# Patient Record
Sex: Female | Born: 1950
Health system: Southern US, Community
[De-identification: ages and names within clinical notes are randomized; demographics above are authoritative.]

## PROBLEM LIST (undated history)

## (undated) DIAGNOSIS — M810 Age-related osteoporosis without current pathological fracture: Secondary | ICD-10-CM

## (undated) DIAGNOSIS — K219 Gastro-esophageal reflux disease without esophagitis: Secondary | ICD-10-CM

## (undated) DIAGNOSIS — D472 Monoclonal gammopathy: Secondary | ICD-10-CM

## (undated) DIAGNOSIS — K5790 Diverticulosis of intestine, part unspecified, without perforation or abscess without bleeding: Secondary | ICD-10-CM

## (undated) DIAGNOSIS — S32030A Wedge compression fracture of third lumbar vertebra, initial encounter for closed fracture: Secondary | ICD-10-CM

## (undated) DIAGNOSIS — T4145XA Adverse effect of unspecified anesthetic, initial encounter: Secondary | ICD-10-CM

## (undated) DIAGNOSIS — T8859XA Other complications of anesthesia, initial encounter: Secondary | ICD-10-CM

## (undated) DIAGNOSIS — G8929 Other chronic pain: Secondary | ICD-10-CM

## (undated) DIAGNOSIS — M549 Dorsalgia, unspecified: Secondary | ICD-10-CM

## (undated) DIAGNOSIS — Z860101 Personal history of adenomatous and serrated colon polyps: Secondary | ICD-10-CM

## (undated) DIAGNOSIS — Z9889 Other specified postprocedural states: Secondary | ICD-10-CM

## (undated) DIAGNOSIS — C9000 Multiple myeloma not having achieved remission: Secondary | ICD-10-CM

## (undated) DIAGNOSIS — R112 Nausea with vomiting, unspecified: Secondary | ICD-10-CM

## (undated) DIAGNOSIS — S22069A Unspecified fracture of T7-T8 vertebra, initial encounter for closed fracture: Secondary | ICD-10-CM

## (undated) DIAGNOSIS — E785 Hyperlipidemia, unspecified: Secondary | ICD-10-CM

## (undated) DIAGNOSIS — J029 Acute pharyngitis, unspecified: Secondary | ICD-10-CM

## (undated) DIAGNOSIS — Z86010 Personal history of colonic polyps: Secondary | ICD-10-CM

## (undated) DIAGNOSIS — Z87442 Personal history of urinary calculi: Secondary | ICD-10-CM

## (undated) DIAGNOSIS — J42 Unspecified chronic bronchitis: Secondary | ICD-10-CM

## (undated) DIAGNOSIS — C801 Malignant (primary) neoplasm, unspecified: Secondary | ICD-10-CM

## (undated) DIAGNOSIS — K449 Diaphragmatic hernia without obstruction or gangrene: Secondary | ICD-10-CM

## (undated) DIAGNOSIS — K649 Unspecified hemorrhoids: Secondary | ICD-10-CM

## (undated) DIAGNOSIS — I10 Essential (primary) hypertension: Secondary | ICD-10-CM

## (undated) DIAGNOSIS — M5416 Radiculopathy, lumbar region: Secondary | ICD-10-CM

## (undated) DIAGNOSIS — N189 Chronic kidney disease, unspecified: Secondary | ICD-10-CM

## (undated) HISTORY — DX: Hyperlipidemia, unspecified: E78.5

## (undated) HISTORY — DX: Acute pharyngitis, unspecified: J02.9

## (undated) HISTORY — DX: Personal history of colonic polyps: Z86.010

## (undated) HISTORY — PX: BACK SURGERY: SHX140

## (undated) HISTORY — PX: HERNIA REPAIR: SHX51

## (undated) HISTORY — DX: Unspecified hemorrhoids: K64.9

## (undated) HISTORY — DX: Age-related osteoporosis without current pathological fracture: M81.0

## (undated) HISTORY — DX: Diaphragmatic hernia without obstruction or gangrene: K44.9

## (undated) HISTORY — DX: Gastro-esophageal reflux disease without esophagitis: K21.9

## (undated) HISTORY — PX: URETERAL STENT PLACEMENT: SHX822

## (undated) HISTORY — DX: Diverticulosis of intestine, part unspecified, without perforation or abscess without bleeding: K57.90

## (undated) HISTORY — PX: CHOLECYSTECTOMY: SHX55

## (undated) HISTORY — DX: Personal history of adenomatous and serrated colon polyps: Z86.0101

## (undated) HISTORY — DX: Essential (primary) hypertension: I10

---

## 1998-08-03 ENCOUNTER — Other Ambulatory Visit: Admission: RE | Admit: 1998-08-03 | Discharge: 1998-08-03 | Payer: Self-pay | Admitting: Gynecology

## 2000-09-06 ENCOUNTER — Other Ambulatory Visit: Admission: RE | Admit: 2000-09-06 | Discharge: 2000-09-06 | Payer: Self-pay | Admitting: Gynecology

## 2001-09-16 ENCOUNTER — Other Ambulatory Visit: Admission: RE | Admit: 2001-09-16 | Discharge: 2001-09-16 | Payer: Self-pay | Admitting: Gynecology

## 2002-12-18 ENCOUNTER — Other Ambulatory Visit: Admission: RE | Admit: 2002-12-18 | Discharge: 2002-12-18 | Payer: Self-pay | Admitting: Gynecology

## 2004-04-18 ENCOUNTER — Other Ambulatory Visit: Admission: RE | Admit: 2004-04-18 | Discharge: 2004-04-18 | Payer: Self-pay | Admitting: Gynecology

## 2004-05-09 ENCOUNTER — Ambulatory Visit: Payer: Self-pay | Admitting: Internal Medicine

## 2005-06-20 ENCOUNTER — Ambulatory Visit: Payer: Self-pay | Admitting: Internal Medicine

## 2005-06-26 ENCOUNTER — Encounter (INDEPENDENT_AMBULATORY_CARE_PROVIDER_SITE_OTHER): Payer: Self-pay | Admitting: Specialist

## 2005-06-26 ENCOUNTER — Ambulatory Visit: Payer: Self-pay | Admitting: Internal Medicine

## 2008-07-23 ENCOUNTER — Encounter: Admission: RE | Admit: 2008-07-23 | Discharge: 2008-07-23 | Payer: Self-pay | Admitting: Gynecology

## 2010-03-21 ENCOUNTER — Encounter: Payer: Self-pay | Admitting: Gynecology

## 2010-10-24 ENCOUNTER — Other Ambulatory Visit: Payer: Self-pay | Admitting: Gynecology

## 2010-10-24 DIAGNOSIS — Z1231 Encounter for screening mammogram for malignant neoplasm of breast: Secondary | ICD-10-CM

## 2010-11-02 ENCOUNTER — Ambulatory Visit
Admission: RE | Admit: 2010-11-02 | Discharge: 2010-11-02 | Disposition: A | Discharge status: Home / Self Care | Payer: BC Managed Care – PPO | Source: Ambulatory Visit | Attending: Gynecology | Admitting: Gynecology

## 2010-11-02 DIAGNOSIS — Z1231 Encounter for screening mammogram for malignant neoplasm of breast: Secondary | ICD-10-CM

## 2011-08-03 ENCOUNTER — Encounter: Payer: Self-pay | Admitting: Internal Medicine

## 2011-10-05 ENCOUNTER — Encounter: Payer: Self-pay | Admitting: *Deleted

## 2011-10-12 ENCOUNTER — Encounter: Payer: Self-pay | Admitting: Internal Medicine

## 2011-10-19 ENCOUNTER — Encounter: Payer: Self-pay | Admitting: Internal Medicine

## 2011-10-19 ENCOUNTER — Ambulatory Visit (INDEPENDENT_AMBULATORY_CARE_PROVIDER_SITE_OTHER): Payer: BC Managed Care – PPO | Admitting: Internal Medicine

## 2011-10-19 VITALS — BP 130/70 | HR 68 | Ht 61.5 in | Wt 191.8 lb

## 2011-10-19 DIAGNOSIS — R1013 Epigastric pain: Secondary | ICD-10-CM

## 2011-10-19 DIAGNOSIS — R933 Abnormal findings on diagnostic imaging of other parts of digestive tract: Secondary | ICD-10-CM

## 2011-10-19 DIAGNOSIS — K219 Gastro-esophageal reflux disease without esophagitis: Secondary | ICD-10-CM

## 2011-10-19 MED ORDER — MOVIPREP 100 G PO SOLR: ORAL | Status: DC

## 2011-10-19 NOTE — Progress Notes (Signed)
Autumn Duran 1950-06-23 MRN [id number]   History of Present Illness:  This is a [age]-year-old white female who is here for 2 reasons:; one is screening colonoscopy. Her last colon exam in April 2007 showed 3 polyps. One was adenomatous and 2 were hyperplastic. She also had hemorrhoids and mild diverticulosis. Another issue has been a large hiatal hernia. On a recent CT scan of the abdomen in June 2013, her hernia appeared larger compared to a 2009 study. She has kidney stones in the right kidney and she is status post cholecystectomy. She has gastroesophageal reflux and fullness in the epigastrium. She regurgitates food and acid at night. She has been on omeprazole 20 mg in the mornings. Patient is complaining of gurgling and bloating as well as gas. Her hiatal hernia bothers her mostly at night, especially when she has late night meals. It helps to walk after supper in the evening.to help the epigastric fullness.   Past Medical History  Diagnosis Date  . Diverticulosis   . Hemorrhoids   . Hx of adenomatous colonic polyps   . Hiatal hernia   . Hypertension   . Hyperlipidemia   . GERD (gastroesophageal reflux disease)    Past Surgical History  Procedure Date  . Cholecystectomy     reports that she has never smoked. She has never used smokeless tobacco. She reports that she does not drink alcohol or use illicit drugs. family history includes Breast cancer in her sister. Allergies  Allergen Reactions  . Morphine And Related     Headache  . Tetracyclines & Related         Review of Systems: Denies dysphagia. Positive for regurgitation of food  The remainder of the 10 point ROS is negative except as outlined in H&P   Physical Exam: General appearance  Well developed, in no distress. Overweight Eyes- non icteric. HEENT nontraumatic, normocephalic. Mouth no lesions, tongue papillated, no cheilosis. Neck supple without adenopathy, thyroid not enlarged, no carotid bruits, no  JVD. Lungs Clear to auscultation bilaterally. Cor normal S1, normal S2, regular rhythm, no murmur,  quiet precordium. Abdomen: Protuberant with normoactive bowel sounds. Rectal: Not done. Extremities no pedal edema. Skin no lesions. Neurological alert and oriented x 3. Psychological normal mood and affect.  Assessment and Plan:  Problem #1 Chronic gastroesophageal reflux with regurgitation of acid and food in the presence of large hiatal hernia which has increased in size based on the recent CT scan of the abdomen. She will switch her Prilosec to an evening dose. She will also take antacids after meals. We have discussed the possibility of doubling up on her PPI and we have also discussed the possibility of a Nissen fundoplication. We will proceed with an upper endoscopy and biopsy to rule out Barrett's esophagus.  Problem #2 Colorectal screening. Patient has a history of adenomatous polyps. She is due for a recall colonoscopy which will be scheduled for the same day as her upper endoscopy.    10/19/2011 [name]

## 2011-10-19 NOTE — Patient Instructions (Addendum)
You have been scheduled for an endoscopy and colonoscopy. Please follow the written instructions given to you at your visit today. Please pick up your prep at the pharmacy within the next 1-3 days. If you use inhalers (even only as needed), please bring them with you on the day of your procedure. Please start taking your omeprazole at bedtime every night instead of in the mornings. You may purchase Gaviscon or Phazyme over the counter to take after meals which will help with gas. CC: Dr Sherril Croon

## 2011-12-18 ENCOUNTER — Encounter (HOSPITAL_COMMUNITY)
Admission: RE | Disposition: A | Discharge status: Home / Self Care | Payer: Self-pay | Source: Ambulatory Visit | Attending: Internal Medicine

## 2011-12-18 ENCOUNTER — Encounter (HOSPITAL_COMMUNITY): Payer: Self-pay | Admitting: Gastroenterology

## 2011-12-18 ENCOUNTER — Ambulatory Visit (HOSPITAL_COMMUNITY)
Admission: RE | Admit: 2011-12-18 | Discharge: 2011-12-18 | Disposition: A | Discharge status: Home / Self Care | Payer: BC Managed Care – PPO | Source: Ambulatory Visit | Attending: Internal Medicine | Admitting: Internal Medicine

## 2011-12-18 DIAGNOSIS — R1013 Epigastric pain: Secondary | ICD-10-CM

## 2011-12-18 DIAGNOSIS — Z860100 Personal history of colon polyps, unspecified: Secondary | ICD-10-CM | POA: Insufficient documentation

## 2011-12-18 DIAGNOSIS — K648 Other hemorrhoids: Secondary | ICD-10-CM | POA: Insufficient documentation

## 2011-12-18 DIAGNOSIS — D126 Benign neoplasm of colon, unspecified: Secondary | ICD-10-CM

## 2011-12-18 DIAGNOSIS — R933 Abnormal findings on diagnostic imaging of other parts of digestive tract: Secondary | ICD-10-CM

## 2011-12-18 DIAGNOSIS — Z9089 Acquired absence of other organs: Secondary | ICD-10-CM | POA: Insufficient documentation

## 2011-12-18 DIAGNOSIS — E785 Hyperlipidemia, unspecified: Secondary | ICD-10-CM | POA: Insufficient documentation

## 2011-12-18 DIAGNOSIS — K219 Gastro-esophageal reflux disease without esophagitis: Secondary | ICD-10-CM

## 2011-12-18 DIAGNOSIS — K449 Diaphragmatic hernia without obstruction or gangrene: Secondary | ICD-10-CM | POA: Insufficient documentation

## 2011-12-18 DIAGNOSIS — I10 Essential (primary) hypertension: Secondary | ICD-10-CM | POA: Insufficient documentation

## 2011-12-18 DIAGNOSIS — R1319 Other dysphagia: Secondary | ICD-10-CM

## 2011-12-18 HISTORY — PX: COLONOSCOPY: SHX5424

## 2011-12-18 HISTORY — PX: ESOPHAGOGASTRODUODENOSCOPY: SHX5428

## 2011-12-18 HISTORY — DX: Chronic kidney disease, unspecified: N18.9

## 2011-12-18 SURGERY — EGD (ESOPHAGOGASTRODUODENOSCOPY)
Anesthesia: Moderate Sedation

## 2011-12-18 MED ORDER — SODIUM CHLORIDE 0.9 % IV SOLN
INTRAVENOUS | Status: DC
  Administered 2011-12-18: 500 mL via INTRAVENOUS

## 2011-12-18 MED ORDER — MIDAZOLAM HCL 10 MG/2ML IJ SOLN
INTRAMUSCULAR | Status: AC
  Filled 2011-12-18: qty 4

## 2011-12-18 MED ORDER — FENTANYL CITRATE 0.05 MG/ML IJ SOLN
INTRAMUSCULAR | Status: AC
  Filled 2011-12-18: qty 4

## 2011-12-18 MED ORDER — MIDAZOLAM HCL 10 MG/2ML IJ SOLN
INTRAMUSCULAR | Status: DC | PRN
  Administered 2011-12-18 (×3): 2.5 mg via INTRAVENOUS

## 2011-12-18 MED ORDER — SUCRALFATE 1 GM/10ML PO SUSP: 1.0000 g | Freq: Two times a day (BID) | ORAL | Status: DC

## 2011-12-18 MED ORDER — ACETAMINOPHEN 325 MG PO TABS
650.0000 mg | ORAL_TABLET | Freq: Once | ORAL | Status: AC
  Administered 2011-12-18: 650 mg via ORAL
  Filled 2011-12-18: qty 2

## 2011-12-18 MED ORDER — OMEPRAZOLE 40 MG PO CPDR: 40.0000 mg | DELAYED_RELEASE_CAPSULE | Freq: Two times a day (BID) | ORAL | Status: DC

## 2011-12-18 MED ORDER — FENTANYL CITRATE 0.05 MG/ML IJ SOLN
INTRAMUSCULAR | Status: DC | PRN
  Administered 2011-12-18 (×3): 25 ug via INTRAVENOUS

## 2011-12-18 MED ORDER — BUTAMBEN-TETRACAINE-BENZOCAINE 2-2-14 % EX AERO
INHALATION_SPRAY | CUTANEOUS | Status: DC | PRN
  Administered 2011-12-18: 2 via TOPICAL

## 2011-12-18 NOTE — Op Note (Signed)
[facility] [address] [place], [address]   ENDOSCOPY PROCEDURE REPORT  PATIENT: Autumn, Duran  MR#: [id number] BIRTHDATE: 1950-12-21 , 61  yrs. old GENDER: Female ENDOSCOPIST: Hart Carwin, MD REFERRED BY:  Doreen Beam, M.D. PROCEDURE DATE:  12/18/2011 PROCEDURE:  EGD w/ biopsy and Maloney dilation of esophagus ASA CLASS:     Class II INDICATIONS:  history of esophageal reflux.   epigastric pain. dysphagia. MEDICATIONS: These medications were titrated to patient response per physician's verbal order, Versed-Detailed 5 mg IV, and Fentanyl-Detailed 5 mcg IV TOPICAL ANESTHETIC: Cetacaine Spray  DESCRIPTION OF PROCEDURE: After the risks benefits and alternatives of the procedure were thoroughly explained, informed consent was obtained.  The Pentax Gastroscope [id number] endoscope was introduced through the mouth and advanced to the second portion of the duodenum. Without limitations.  The instrument was slowly withdrawn as the mucosa was fully examined.      ESOPHAGUS: Irregular z-line, mild nonobstructing stricture, no hiatal hernia, Maloney dilator 48 F passed without difficulty, biopsies taken from the g-e junction to r/o Barrett's.  Retroflexed views revealed no abnormalities.     The scope was then withdrawn from the patient and the procedure completed.  COMPLICATIONS: There were no complications. ENDOSCOPIC IMPRESSION: Irregular z-line, mild nonobstructing stricture,  no hiatal hernia, Maloney dilator 48 F passed without difficulty, biopsies taken from the g-e junction to r/o Barrett's  RECOMMENDATIONS: 1.  await pathology results 2.  anti-reflux regimen to be follow 3.  continue PPI  REPEAT EXAM: no recall unless Barrett's esophagus  eSigned:  Hart Carwin, MD 12/18/2011 9:26 AM   CC:  PATIENT NAME:  Autumn, Duran MR#: [id number]

## 2011-12-18 NOTE — Op Note (Signed)
[facility] [address] [place], [address]   COLONOSCOPY PROCEDURE REPORT  PATIENT: Autumn Duran, Autumn Duran  MR#: [id number] BIRTHDATE: [birth year] , 61  yrs. old GENDER: Female ENDOSCOPIST: Hart Carwin, MD REFERRED BY:  Doreen Beam, M.D. PROCEDURE DATE:  12/18/2011 PROCEDURE:   Colonoscopy with cold biopsy polypectomy and Colonoscopy with snare polypectomy ASA CLASS:   Class II INDICATIONS:patient's personal history of adenomatous colon polyps and tubular adenoma 2007. MEDICATIONS: These medications were titrated to patient response per physician's verbal order, Versed-Detailed 2.5 mg IV, and Fentanyl-Detailed 25 mcg IV  DESCRIPTION OF PROCEDURE:   After the risks and benefits and of the procedure were explained, informed consent was obtained.  A digital rectal exam revealed no abnormalities of the rectum.    The endoscope was introduced through the anus and advanced to the cecum, which was identified by the ileocecal valve .  The quality of the prep was excellent, using MoviPrep .  The instrument was then slowly withdrawn as the colon was fully examined.     COLON FINDINGS: Eight smooth sessile polyps ranging between 3-16mm in size were found in the ascending colon and sigmoid colon.  A polypectomy was performed with cold forceps and with a cold snare. The resection was complete and the polyp tissue was completely retrieved.     Retroflexed views revealed no abnormalities.     The scope was then withdrawn from the patient and the procedure completed.  COMPLICATIONS: There were no complications. ENDOSCOPIC IMPRESSION: Eight sessile polyps ranging between 3-10mm in size were found in the ascending colon at 90cnm, and sigmoid colon at 15-20 cm polypectomy was performed with cold forceps and with a cold snare First grade internal hemorrhoids  RECOMMENDATIONS: 1.  await pathology results 2.  High fiber diet   REPEAT EXAM: In 5 year(s)  for  Colonoscopy.  cc:  _______________________________ eSignedHart Carwin, MD 12/18/2011 9:32 AM

## 2011-12-18 NOTE — H&P (Signed)
Autumn Duran 1950/03/27 MRN [id number]        History of Present Illness: 61 yo with chronic GERD refractory to PPI's, 1 episode of dysphagia, here for EGD, Also hx of adenomatous colon polyp 05/2005 - due for recall colonoscopy,     Past Medical History  Diagnosis Date  . Diverticulosis   . Hemorrhoids   . Hx of adenomatous colonic polyps   . Hiatal hernia   . Hypertension   . Hyperlipidemia   . GERD (gastroesophageal reflux disease)   . Chronic kidney disease     kidney stone s/p stent placement ( removed)   Past Surgical History  Procedure Date  . Cholecystectomy     reports that she has never smoked. She has never used smokeless tobacco. She reports that she does not drink alcohol or use illicit drugs. family history includes Breast cancer in her sister. Allergies  Allergen Reactions  . Morphine And Related     Headache  . Tetracyclines & Related         Review of Systems:  The remainder of the 10 point ROS is negative except as outlined in H&P   Physical Exam: General appearance  Well developed, in no distress. Eyes- non icteric. HEENT nontraumatic, normocephalic. Mouth no lesions, tongue papillated, no cheilosis. Neck supple without adenopathy, thyroid not enlarged, no carotid bruits, no JVD. Lungs Clear to auscultation bilaterally. Cor normal S1, normal S2, regular rhythm, no murmur,  quiet precordium. Abdomen: soft, ,active bowl sounds,  Rectal:deferred Extremities no pedal edema. Skin no lesions. Neurological alert and oriented x 3. Psychological normal mood and affect.  Assessment and Plan:  #1 problem GERD, dysphagia, hx of hiatal hernia, plan to proceed with EGD, possible dilation, r/o Barrett's esophagus  #2 Hx of adenomatous colon polyp 2007, due for recall colonoscopy   12/18/2011 Autumn Duran

## 2011-12-19 ENCOUNTER — Encounter (HOSPITAL_COMMUNITY): Payer: Self-pay | Admitting: Internal Medicine

## 2011-12-22 ENCOUNTER — Encounter: Payer: Self-pay | Admitting: Internal Medicine

## 2012-06-06 ENCOUNTER — Other Ambulatory Visit: Payer: Self-pay | Admitting: Gynecology

## 2013-01-01 ENCOUNTER — Other Ambulatory Visit: Payer: Self-pay

## 2013-01-01 DIAGNOSIS — Z1231 Encounter for screening mammogram for malignant neoplasm of breast: Secondary | ICD-10-CM

## 2013-02-03 ENCOUNTER — Ambulatory Visit
Admission: RE | Admit: 2013-02-03 | Discharge: 2013-02-03 | Disposition: A | Discharge status: Home / Self Care | Payer: BC Managed Care – PPO | Source: Ambulatory Visit

## 2013-02-03 DIAGNOSIS — Z1231 Encounter for screening mammogram for malignant neoplasm of breast: Secondary | ICD-10-CM

## 2013-07-10 ENCOUNTER — Other Ambulatory Visit: Payer: Self-pay | Admitting: Neurosurgery

## 2013-07-11 ENCOUNTER — Encounter (HOSPITAL_COMMUNITY): Payer: Self-pay | Admitting: *Deleted

## 2013-07-11 ENCOUNTER — Encounter (HOSPITAL_COMMUNITY): Payer: Self-pay | Admitting: Pharmacy Technician

## 2013-07-12 ENCOUNTER — Encounter (HOSPITAL_COMMUNITY): Payer: Self-pay | Admitting: Emergency Medicine

## 2013-07-12 ENCOUNTER — Inpatient Hospital Stay (HOSPITAL_COMMUNITY)
Admission: EM | Admit: 2013-07-12 | Discharge: 2013-07-17 | DRG: 479 | Disposition: A | Discharge status: Home Health | Payer: BC Managed Care – PPO | Attending: Internal Medicine | Admitting: Internal Medicine

## 2013-07-12 ENCOUNTER — Inpatient Hospital Stay (HOSPITAL_COMMUNITY): Payer: BC Managed Care – PPO

## 2013-07-12 DIAGNOSIS — IMO0002 Reserved for concepts with insufficient information to code with codable children: Secondary | ICD-10-CM

## 2013-07-12 DIAGNOSIS — N189 Chronic kidney disease, unspecified: Secondary | ICD-10-CM | POA: Diagnosis present

## 2013-07-12 DIAGNOSIS — T4275XA Adverse effect of unspecified antiepileptic and sedative-hypnotic drugs, initial encounter: Secondary | ICD-10-CM | POA: Diagnosis present

## 2013-07-12 DIAGNOSIS — X58XXXA Exposure to other specified factors, initial encounter: Secondary | ICD-10-CM | POA: Diagnosis present

## 2013-07-12 DIAGNOSIS — I959 Hypotension, unspecified: Secondary | ICD-10-CM | POA: Diagnosis present

## 2013-07-12 DIAGNOSIS — I129 Hypertensive chronic kidney disease with stage 1 through stage 4 chronic kidney disease, or unspecified chronic kidney disease: Secondary | ICD-10-CM | POA: Diagnosis present

## 2013-07-12 DIAGNOSIS — J42 Unspecified chronic bronchitis: Secondary | ICD-10-CM | POA: Diagnosis present

## 2013-07-12 DIAGNOSIS — R1319 Other dysphagia: Secondary | ICD-10-CM

## 2013-07-12 DIAGNOSIS — K5909 Other constipation: Secondary | ICD-10-CM | POA: Diagnosis present

## 2013-07-12 DIAGNOSIS — S32000A Wedge compression fracture of unspecified lumbar vertebra, initial encounter for closed fracture: Secondary | ICD-10-CM

## 2013-07-12 DIAGNOSIS — E785 Hyperlipidemia, unspecified: Secondary | ICD-10-CM | POA: Diagnosis present

## 2013-07-12 DIAGNOSIS — Z22322 Carrier or suspected carrier of Methicillin resistant Staphylococcus aureus: Secondary | ICD-10-CM

## 2013-07-12 DIAGNOSIS — K5900 Constipation, unspecified: Secondary | ICD-10-CM

## 2013-07-12 DIAGNOSIS — R52 Pain, unspecified: Secondary | ICD-10-CM

## 2013-07-12 DIAGNOSIS — F411 Generalized anxiety disorder: Secondary | ICD-10-CM | POA: Diagnosis present

## 2013-07-12 DIAGNOSIS — E860 Dehydration: Secondary | ICD-10-CM | POA: Diagnosis present

## 2013-07-12 DIAGNOSIS — K219 Gastro-esophageal reflux disease without esophagitis: Secondary | ICD-10-CM

## 2013-07-12 DIAGNOSIS — K449 Diaphragmatic hernia without obstruction or gangrene: Secondary | ICD-10-CM | POA: Diagnosis present

## 2013-07-12 DIAGNOSIS — I10 Essential (primary) hypertension: Secondary | ICD-10-CM

## 2013-07-12 DIAGNOSIS — S32009A Unspecified fracture of unspecified lumbar vertebra, initial encounter for closed fracture: Principal | ICD-10-CM | POA: Diagnosis present

## 2013-07-12 DIAGNOSIS — D126 Benign neoplasm of colon, unspecified: Secondary | ICD-10-CM

## 2013-07-12 LAB — CBC WITH DIFFERENTIAL/PLATELET
Basophils Absolute: 0 10*3/uL (ref 0.0–0.1)
Basophils Relative: 0 % (ref 0–1)
Eosinophils Absolute: 0.1 10*3/uL (ref 0.0–0.7)
Eosinophils Relative: 1 % (ref 0–5)
HCT: 37.2 % (ref 36.0–46.0)
Hemoglobin: 11.9 g/dL — ABNORMAL LOW (ref 12.0–15.0)
Lymphocytes Relative: 20 % (ref 12–46)
Lymphs Abs: 1.8 10*3/uL (ref 0.7–4.0)
MCH: 27.3 pg (ref 26.0–34.0)
MCHC: 32 g/dL (ref 30.0–36.0)
MCV: 85.3 fL (ref 78.0–100.0)
Monocytes Absolute: 0.7 10*3/uL (ref 0.1–1.0)
Monocytes Relative: 8 % (ref 3–12)
Neutro Abs: 6.3 10*3/uL (ref 1.7–7.7)
Neutrophils Relative %: 71 % (ref 43–77)
Platelets: 324 10*3/uL (ref 150–400)
RBC: 4.36 MIL/uL (ref 3.87–5.11)
RDW: 13.1 % (ref 11.5–15.5)
WBC: 8.8 10*3/uL (ref 4.0–10.5)

## 2013-07-12 LAB — BASIC METABOLIC PANEL
BUN: 18 mg/dL (ref 6–23)
CO2: 26 mEq/L (ref 19–32)
Calcium: 10.4 mg/dL (ref 8.4–10.5)
Chloride: 101 mEq/L (ref 96–112)
Creatinine, Ser: 0.69 mg/dL (ref 0.50–1.10)
GFR calc Af Amer: >90 mL/min (ref >90)
GFR calc non Af Amer: >90 mL/min (ref >90)
Glucose, Bld: 102 mg/dL — ABNORMAL HIGH (ref 70–99)
Potassium: 3.7 mEq/L (ref 3.7–5.3)
Sodium: 138 mEq/L (ref 137–147)

## 2013-07-12 MED ORDER — FENTANYL CITRATE 0.05 MG/ML IJ SOLN
50.0000 ug | Freq: Once | INTRAMUSCULAR | Status: AC
  Administered 2013-07-12: 50 ug via INTRAVENOUS

## 2013-07-12 MED ORDER — MAGNESIUM CITRATE PO SOLN
1.0000 | Freq: Once | ORAL | Status: AC | PRN
  Administered 2013-07-12: 1 via ORAL
  Filled 2013-07-12: qty 296

## 2013-07-12 MED ORDER — IOHEXOL 300 MG/ML  SOLN
20.0000 mL | INTRAMUSCULAR | Status: AC
  Administered 2013-07-12: 25 mL via ORAL

## 2013-07-12 MED ORDER — FENTANYL CITRATE 0.05 MG/ML IJ SOLN
50.0000 ug | Freq: Once | INTRAMUSCULAR | Status: AC
  Administered 2013-07-12: 50 ug via INTRAVENOUS
  Filled 2013-07-12: qty 2

## 2013-07-12 MED ORDER — CEFAZOLIN SODIUM-DEXTROSE 2-3 GM-% IV SOLR
2.0000 g | INTRAVENOUS | Status: AC
  Administered 2013-07-13: 2 g via INTRAVENOUS
  Filled 2013-07-12: qty 50

## 2013-07-12 MED ORDER — DIAZEPAM 5 MG PO TABS
5.0000 mg | ORAL_TABLET | Freq: Three times a day (TID) | ORAL | Status: DC | PRN
  Administered 2013-07-12 – 2013-07-14 (×3): 5 mg via ORAL
  Filled 2013-07-12 (×3): qty 1

## 2013-07-12 MED ORDER — SODIUM CHLORIDE 0.9 % IV BOLUS (SEPSIS)
500.0000 mL | Freq: Once | INTRAVENOUS | Status: AC
  Administered 2013-07-12: 500 mL via INTRAVENOUS

## 2013-07-12 MED ORDER — ONDANSETRON HCL 4 MG PO TABS: 4.0000 mg | ORAL_TABLET | Freq: Four times a day (QID) | ORAL | Status: DC | PRN

## 2013-07-12 MED ORDER — DOCUSATE SODIUM 100 MG PO CAPS
100.0000 mg | ORAL_CAPSULE | Freq: Two times a day (BID) | ORAL | Status: DC
  Administered 2013-07-13 – 2013-07-17 (×9): 100 mg via ORAL
  Filled 2013-07-12 (×9): qty 1

## 2013-07-12 MED ORDER — SORBITOL 70 % SOLN
30.0000 mL | Freq: Once | Status: AC
  Administered 2013-07-13: 30 mL via ORAL
  Filled 2013-07-12: qty 30

## 2013-07-12 MED ORDER — HYDROMORPHONE HCL PF 1 MG/ML IJ SOLN
0.5000 mg | Freq: Once | INTRAMUSCULAR | Status: AC
  Administered 2013-07-12: 0.5 mg via INTRAVENOUS
  Filled 2013-07-12: qty 1

## 2013-07-12 MED ORDER — SODIUM CHLORIDE 0.9 % IV SOLN
INTRAVENOUS | Status: AC
  Administered 2013-07-13: via INTRAVENOUS

## 2013-07-12 MED ORDER — HYDROMORPHONE HCL PF 1 MG/ML IJ SOLN
0.5000 mg | INTRAMUSCULAR | Status: DC | PRN
  Administered 2013-07-12 – 2013-07-15 (×6): 0.5 mg via INTRAVENOUS
  Filled 2013-07-12 (×7): qty 1

## 2013-07-12 MED ORDER — ONDANSETRON HCL 4 MG/2ML IJ SOLN
4.0000 mg | Freq: Four times a day (QID) | INTRAMUSCULAR | Status: DC | PRN
  Administered 2013-07-15: 4 mg via INTRAVENOUS
  Filled 2013-07-12: qty 2

## 2013-07-12 MED ORDER — IOHEXOL 300 MG/ML  SOLN: 100.0000 mL | Freq: Once | INTRAMUSCULAR | Status: AC | PRN

## 2013-07-12 MED ORDER — MUPIROCIN 2 % EX OINT
TOPICAL_OINTMENT | Freq: Two times a day (BID) | CUTANEOUS | Status: DC
  Administered 2013-07-13 (×2): 1 via NASAL
  Administered 2013-07-13 – 2013-07-15 (×5): via NASAL
  Administered 2013-07-16: 1 via NASAL
  Administered 2013-07-16 – 2013-07-17 (×2): via NASAL
  Filled 2013-07-12: qty 22

## 2013-07-12 MED ORDER — ONDANSETRON 4 MG PO TBDP
8.0000 mg | ORAL_TABLET | Freq: Once | ORAL | Status: AC
  Administered 2013-07-12: 8 mg via ORAL
  Filled 2013-07-12: qty 2

## 2013-07-12 MED ORDER — DEXAMETHASONE SODIUM PHOSPHATE 10 MG/ML IJ SOLN
10.0000 mg | INTRAMUSCULAR | Status: AC
  Administered 2013-07-13: 10 mg via INTRAVENOUS
  Filled 2013-07-12: qty 1

## 2013-07-12 MED ORDER — ENOXAPARIN SODIUM 40 MG/0.4ML ~~LOC~~ SOLN
40.0000 mg | SUBCUTANEOUS | Status: DC
  Administered 2013-07-13: 40 mg via SUBCUTANEOUS
  Filled 2013-07-12 (×2): qty 0.4

## 2013-07-12 MED ORDER — SENNA 8.6 MG PO TABS
1.0000 | ORAL_TABLET | Freq: Two times a day (BID) | ORAL | Status: DC
  Administered 2013-07-13 – 2013-07-17 (×9): 8.6 mg via ORAL
  Filled 2013-07-12 (×11): qty 1

## 2013-07-12 MED ORDER — SORBITOL 70 % SOLN
30.0000 mL | Freq: Every day | Status: DC | PRN
  Administered 2013-07-13: 30 mL via ORAL
  Filled 2013-07-12: qty 30

## 2013-07-12 MED ORDER — HYDROCODONE-ACETAMINOPHEN 5-325 MG PO TABS
1.0000 | ORAL_TABLET | ORAL | Status: DC | PRN
  Administered 2013-07-13 – 2013-07-14 (×5): 1 via ORAL
  Filled 2013-07-12 (×5): qty 1

## 2013-07-12 NOTE — ED Provider Notes (Signed)
CSN: [id number]     Arrival date & time 07/12/13  1311 History   First MD Initiated Contact with Patient 07/12/13 1547     Chief Complaint  Patient presents with  . Back Pain     (Consider location/radiation/quality/duration/timing/severity/associated sxs/prior Treatment) HPI Comments: Patient presents to the ER for evaluation of low back pain radiating to the right leg. Patient reports that symptoms have been ongoing for more than a month. She has had progressive worsening of pain. She has been seen by multiple doctors, most recently Doctor Trenton Gammon of neurosurgery. She is scheduled to have vertebroplasty in 2 days. Patient reports that in the last couple of days the pain has become severe and unbearable. She cannot walk, has had to crawl to the bathroom because of the pain. There has not been any recent fall. She denies change in bowel or bladder function.  Patient is a 63 y.o. female presenting with back pain.  Back Pain   Past Medical History  Diagnosis Date  . Diverticulosis   . Hemorrhoids   . Hx of adenomatous colonic polyps   . Hiatal hernia   . Hypertension   . Hyperlipidemia   . GERD (gastroesophageal reflux disease)   . Chronic kidney disease     kidney stone s/p stent placement ( removed)  . Complication of anesthesia   . PONV (postoperative nausea and vomiting)   . Bronchitis, chronic    Past Surgical History  Procedure Laterality Date  . Cholecystectomy    . Esophagogastroduodenoscopy  12/18/2011    Procedure: ESOPHAGOGASTRODUODENOSCOPY (EGD);  Surgeon: Lafayette Dragon, MD;  Location: [facility];  Service: Endoscopy;  Laterality: N/A;  . Colonoscopy  12/18/2011    Procedure: COLONOSCOPY;  Surgeon: Lafayette Dragon, MD;  Location: [facility];  Service: Endoscopy;  Laterality: N/A;  . Ureteral stent placement     Family History  Problem Relation Age of Onset  . Breast cancer Sister    History  Substance Use Topics  . Smoking status: Never Smoker   . Smokeless  tobacco: Never Used  . Alcohol Use: Yes     Comment: social occ   OB History   Grav Para Term Preterm Abortions TAB SAB Ect Mult Living                 Review of Systems  Musculoskeletal: Positive for back pain.  All other systems reviewed and are negative.     Allergies  Morphine and related and Tetracyclines & related  Home Medications   Prior to Admission medications   Medication Sig Start Date End Date Taking? Authorizing Provider  acetaminophen (TYLENOL) 500 MG tablet Take 1,000 mg by mouth every 6 (six) hours as needed for moderate pain.    Historical Provider, MD  olmesartan-hydrochlorothiazide (BENICAR HCT) 20-12.5 MG per tablet Take 0.5 tablets by mouth daily.    Historical Provider, MD  omeprazole (PRILOSEC) 40 MG capsule Take 40 mg by mouth daily.    Historical Provider, MD  oxyCODONE-acetaminophen (PERCOCET) 10-325 MG per tablet Take 1 tablet by mouth every 4 (four) hours as needed for pain.    Historical Provider, MD   BP 105/47  Pulse 83  Temp(Src) 98.1 F (36.7 C) (Oral)  Resp 16  Ht 5\' 1"  (1.549 m)  Wt 188 lb (85.276 kg)  BMI 35.54 kg/m2  SpO2 93% Physical Exam  Constitutional: She is oriented to person, place, and time. She appears well-developed and well-nourished. She appears distressed.  HENT:  Head: Normocephalic and  atraumatic.  Right Ear: Hearing normal.  Left Ear: Hearing normal.  Nose: Nose normal.  Mouth/Throat: Oropharynx is clear and moist and mucous membranes are normal.  Eyes: Conjunctivae and EOM are normal. Pupils are equal, round, and reactive to light.  Neck: Normal range of motion. Neck supple.  Cardiovascular: Regular rhythm, S1 normal and S2 normal.  Exam reveals no gallop and no friction rub.   No murmur heard. Pulmonary/Chest: Effort normal and breath sounds normal. No respiratory distress. She exhibits no tenderness.  Abdominal: Soft. Normal appearance and bowel sounds are normal. There is no hepatosplenomegaly. There is no  tenderness. There is no rebound, no guarding, no tenderness at McBurney's point and negative Murphy's sign. No hernia.  Musculoskeletal: Normal range of motion.       Right hip: She exhibits normal range of motion and no tenderness.       Lumbar back: She exhibits tenderness and bony tenderness.       Back:  Neurological: She is alert and oriented to person, place, and time. She has normal strength. No cranial nerve deficit or sensory deficit. Coordination normal. GCS eye subscore is 4. GCS verbal subscore is 5. GCS motor subscore is 6.  Skin: Skin is warm, dry and intact. No rash noted. No cyanosis.  Psychiatric: She has a normal mood and affect. Her speech is normal and behavior is normal. Thought content normal.    ED Course  Procedures (including critical care time) Labs Review Labs Reviewed  CBC WITH DIFFERENTIAL  BASIC METABOLIC PANEL    Imaging Review No results found.   EKG Interpretation None      MDM   Final diagnoses:  None    Patient presents for evaluation of severe worsening low back pain. She has previously been diagnosed with a compression fracture and is scheduled for vertebroplasty on Monday. Patient's pain is worsening and not improved at all with the hydrocodone she is using at home. Arrival to ER, patient has mild hypotension. De Blanch has a history of hypertension. She does not have a fever or leukocytosis, and no signs of sepsis. She was given IV fluids. She was provided analgesia with fentanyl. She has only had very minor improvement with the medication.  Case discussed with Doctor Vertell Limber, call for neurosurgery. He tells me further information. The patient's neurosurgeon, Doctor Trenton Gammon, is concerned about possible metastatic disease causing a pathological fracture in the lumbar vertebrae. He recommends admission to general medicine for further workup including CT of chest, abdomen, pelvis look for source of metastatic disease. Regardless of findings, would  recommend proceeding with vertebroplasty on Monday for pain relief.   Orpah Greek, MD 07/12/13 [phone]

## 2013-07-12 NOTE — Consult Note (Signed)
I    > [place], [place] [address] Phone: [phone]   Patient ID:   [id number] Patient: Autumn Duran  Date of Birth: Jan 09, 1951 Visit Type: Office Visit   Date: 07/10/2013 01:30 PM Provider: Mallie Mussel A. Pool MD   This 63 year old female presents for back pain.  History of Present Illness: 1.  back pain  The patient is a 63 year old female who has been referred for evaluation of severe lumbar pain.  The patient's symptoms began spontaneously approximately 4 weeks ago without any precipitating event.  She has never previously had pain like this in the past.  The pain is present in her right lumbosacral region.  The pain does not radiate.  It is not associated with any numbness, paresthesias, or weakness.  It is not associated with any bowel or bladder dysfunction.  She has no history of fever.  She has no history of malignancy.  She has no history of recent weight change.  She has no chronic cough or complaints of hemoptysis or GI blood loss.  The pain is constant and severe.  She gets mild relief with sitting.       PAST MEDICAL HISTORY, SURGICAL HISTORY, FAMILY HISTORY, SOCIAL HISTORY AND REVIEW OF SYSTEMS I have reviewed the patient's past medical, surgical, family and social history as well as the comprehensive review of systems as included on the [facility] history form dated 07/10/2013, which I have signed.    MEDICATIONS(added, continued or stopped this visit):   Started Medication Directions Instruction Stopped   Benicar  ORAL      hydrocodone 5 mg-acetaminophen 325 mg tablet take 1 tablet by oral route  every 6 hours as needed for pain    07/10/2013 Percocet 10 mg-325 mg tablet take 1-2  tablets by oral route  every 6 hours as needed     Prilosec 20 mg capsule,delayed release take 1 capsule by oral route  every day 30 minutes to 1 hour before a meal     Valium 5 mg tablet take 1 tablet by oral route 3  times every day    07/10/2013 Valium 5 mg tablet take 1-2 tablet by oral route every 6 hours as needed      ALLERGIES:  Ingredient Reaction Medication Name Comment  NO KNOWN ALLERGIES     No known allergies.   Vitals Date Temp F BP Pulse Ht In Wt Lb BMI BSA Pain Score  07/10/2013  100/63 80 61 188 35.52  9/10     PHYSICAL EXAM General Level of Distress: moderate distress Overall Appearance: normal  Head and Face  Right Left  Fundoscopic Exam:  normal normal    Cardiovascular Cardiac: normal  Right Left  Peripheral Pulses: normal normal  Carotid Pulses: normal normal  Respiratory Lungs: normal  Neurological Orientation: normal Recent and Remote Memory: normal Attention Span and Concentration:   normal Language: normal Fund of Knowledge: normal  Right Left Sensation: normal normal Upper Extremity Coordination: normal normal  Lower Extremity Coordination: normal normal  Musculoskeletal Gait and Station: antalgic gait, limps R  Right Left Upper Extremity Muscle Strength: normal normal Lower Extremity Muscle Strength: normal normal Upper Extremity Muscle Tone:  normal normal Lower Extremity Muscle Tone: normal normal  Motor Strength Upper and lower extremity motor strength was tested in the clinically pertinent muscles.     Deep Tendon Reflexes  Right Left Biceps: normal normal Triceps: normal normal Brachiloradialis: normal normal Patellar:  normal normal Achilles: normal normal  Sensory Sensation was tested at L1 to S1.   Cranial Nerves II. Optic Nerve/Visual Fields: normal III. Oculomotor: normal IV. Trochlear: normal V. Trigeminal: normal VI. Abducens: normal VII. Facial: normal VIII. Acoustic/Vestibular: normal IX. Glossopharyngeal: normal X. Vagus: normal XI. Spinal Accessory: normal XII. Hypoglossal: normal  Motor and other Tests Lhermittes: negative Rhomberg: negative Pronator  drift: absent     Right Left Spurlings negative negative Hoffman's: absent absent Clonus: absent absent SLR: negative negative Patrick's Corky Sox): negative negative Toe Walk: normal normal Toe Lift: normal normal Heel Walk: normal normal SI Joint: tender       DIAGNOSTIC RESULTS I reviewed the patient's MRI scan of her lumbar spine.  This demonstrates evidence for grade 1 L5-S1 degenerative spondylolisthesis with some foraminal narrowing bilaterally.  These changes appear to be more chronic.  At L3 on the right side the patient has evidence of a pathological vertebral body fracture with some reactive change of her right psoas muscle around the level of this fracture.  There is signal change within the superior aspect of the vertebral body on the right side consistent with hemangioma.  The remainder of the fracture site is indeterminate as to imaging.  There is definitely no epidural involvement.    IMPRESSION This patient has a very symptomatic pathologic fracture of L3 from unknown cause.  She needs to have a screening CT scan of her chest abdomen and pelvis to evaluate for possible neoplastic disease.  Given her fracture and pain level I think it is appropriate to move forward with a percutaneous vertebral body biopsy with concurrent methylmethacrylate vertebroplasty.  I discussed the risks and benefits involved with this procedure including but not limited to risk of anesthesia, bleeding, infection, nerve injury, vertebroplasty failure, continued pain, and non-benefit.  I also discussed the risks of possible non-diagnosis with the biopsy.  The patient has been given the opportunity to ask questions.  She appears to understand.  She wishes to proceed.  Assessment/Plan # Detail Type Description   1. Assessment Pathologic fracture of vertebra (733.13).         Pain Assessment/Treatment Pain Scale: 9/10. Method: Numeric Pain Intensity Scale. Location: lumbar. Onset:  06/05/2013. Duration: varies. Quality: aching, throbbing. Pain Assessment/Treatment follow-up plan of care: Patient will continue medicaiton management..  Fall Risk Plan The patient has not fallen in the last year.  Right L3 vertebral body biopsy and vertebroplasty  Orders: Diagnostic Procedures: Assessment Procedure  733.13 --------Free Text Surgery-------- - L3 perc biopsy with vertebroplasty  733.13 CT Abd & Pel; With Contrast  733.13 CT Chest With And W/o Contrast    MEDICATIONS PRESCRIBED TODAY    Rx Quantity Refills  VALIUM 5 mg  60 1  PERCOCET 10 mg-325 mg  90 0            Provider:  Cooper Render. Pool MD  07/10/2013 05:15 PM Dictation edited by: Cooper Render. Pool    CC Providers: [facility] [place], [place] [address]- ----------------------------------------------------------------------------------------------------------------------------------------------------------------------         Electronically signed by Cooper Render Pool MD on 07/10/2013 05:27 PM     I have imported the above note from our practice EMR.  Dr. Annette Stable saw the patient on 07/10/13 and scheduled her for a vertebral biopsy and cement augmentation on 07/14/13, but because of the severity of her pain, she is being admitted to the Medicine Service.  This will be useful for pain  control as well as workup of possible malignancy.  She should have Chest/Abdomen/Pelvis CT scan as an inpatient, rather than as an outpatient as was previously planned in an effort to work this pathologic fracture.

## 2013-07-12 NOTE — ED Notes (Signed)
Per pt sts worsening back pain over the past month. sts has been seen by multiple doctors and was scheduled for a biopsy on Monday. sts legs aching.

## 2013-07-12 NOTE — H&P (Signed)
[facility] History and Physical  Autumn Duran JIR:[id number] DOB: 06-Apr-1950 DOA: 07/12/2013  Referring physician: Betsey Holiday PCP: [name], MD   Chief Complaint: Low back pain  HPI: Autumn Duran is a 63 y.o. female. At baseline she has little comorbid disease. She takes Benicar for hypertension. Her reflux disease is controlled on Prilosec once daily. She does say that she has a chronic "bronchitis" which she believes may be related to sinus disease. In general she has a good functional status is able to ambulate well without cardiopulmonary limitations per her report.  The current illness started approximately one month ago. The patient complains of a right hip and right-sided low back pain which was unprovoked in onset. It has become progressively more severe. It is described as a sharp pain originating in the right lumbar region radiating forward to the right hip. It has significantly limited her ambulation. She has had some relief with hydrocodone; she describes taking approximately 6 tabs daily in recent times. The patient denies any focal weakness or numbness but does describe that in general her legs become weak from time to time, a complaint that preceded this illness.  She does not have any bowel or bladder incontinence any neurologic symptoms such as paresthesias. Patient denies fevers or chills. The patient does complain of significant constipation which she relates to her pain medications. Her chronic cough is ongoing and at her baseline level of severity; she does commonly bring up white sputum.    The patient does not have any personal history of cancer and comments that she is up-to-date on her age-appropriate cancer screening. She reports having a colonoscopy with one polyp 2 years ago. She gets regular mammograms which have been negative.  Review of Systems:  Review of Systems - History obtained from the patient General ROS: positive for  - fatigue negative for - night  sweats or weight loss Allergy and Immunology ROS: positive for - nasal congestion negative for - else Breast ROS: negative for breast lumps Respiratory ROS: no cough, shortness of breath, or wheezing positive for - cough negative for - shortness of breath or wheezing Cardiovascular ROS: no chest pain or dyspnea on exertion Gastrointestinal ROS: positive for - constipation negative for - melena or nausea/vomiting Genito-Urinary ROS: no dysuria, trouble voiding, or hematuria Musculoskeletal ROS: positive for - low back pain negative for - joint stiffness Neurological ROS: no TIA or stroke symptoms Dermatological ROS: negative The remainder of a complete review of systems was reviewed and was negative.  Past Medical History  Diagnosis Date  . Diverticulosis   . Hemorrhoids   . Hx of adenomatous colonic polyps   . Hiatal hernia   . Hypertension   . Hyperlipidemia   . GERD (gastroesophageal reflux disease)   . Chronic kidney disease     kidney stone s/p stent placement ( removed)  . Complication of anesthesia   . PONV (postoperative nausea and vomiting)   . Bronchitis, chronic    Past Surgical History  Procedure Laterality Date  . Cholecystectomy    . Esophagogastroduodenoscopy  12/18/2011    Procedure: ESOPHAGOGASTRODUODENOSCOPY (EGD);  Surgeon: Lafayette Dragon, MD;  Location: [facility];  Service: Endoscopy;  Laterality: N/A;  . Colonoscopy  12/18/2011    Procedure: COLONOSCOPY;  Surgeon: Lafayette Dragon, MD;  Location: [facility];  Service: Endoscopy;  Laterality: N/A;  . Ureteral stent placement     Social History:  reports that she has never smoked. She has never used smokeless tobacco. She reports  that she drinks alcohol. She reports that she does not use illicit drugs.  Allergies  Allergen Reactions  . Morphine And Related Other (See Comments)    Headache  . Tetracyclines & Related Nausea And Vomiting    Family History  Problem Relation Age of Onset  . Breast  cancer Sister 73  . Breast cancer Other 26    aunt     Prior to Admission medications   Medication Sig Start Date End Date Taking? Authorizing Provider  acetaminophen (TYLENOL) 500 MG tablet Take 1,000 mg by mouth every 6 (Duran) hours as needed for moderate pain.   Yes Historical Provider, MD  diazepam (VALIUM) 5 MG tablet Take 5 mg by mouth every 8 (eight) hours as needed for anxiety.   Yes Historical Provider, MD  HYDROcodone-acetaminophen (NORCO/VICODIN) 5-325 MG per tablet Take 1 tablet by mouth every 4 (four) hours as needed for moderate pain.   Yes Historical Provider, MD  ibuprofen (ADVIL,MOTRIN) 200 MG tablet Take 400 mg by mouth every 4 (four) hours as needed for mild pain.   Yes Historical Provider, MD  olmesartan (BENICAR) 20 MG tablet Take 20 mg by mouth daily.   Yes Historical Provider, MD    Physical Exam: Filed Vitals:   07/12/13 1915  BP: 111/65  Pulse: 79  Temp:   Resp:    BP 111/65  Pulse 79  Temp(Src) 98.1 F (36.7 C) (Oral)  Resp 16  Ht 5\' 1"  (1.549 m)  Wt 85.276 kg (188 lb)  BMI 35.54 kg/m2  SpO2 99%  General: Alert, oriented to self, place, and time. HEENT: Gaze is conjugate.  No nasal deformities.  No nasal discharge noted.  Mucus membranes are somewhat dry.  Tongue protrudes midline. Cardiovascular: Normal rate with regular rhythm.  Normal S1 and S2.  No rubs or gallop sounds.  No murmur.  No jugular venous distention noted.  Pulmonary: Good aeration to the bilateral bases.  No adventitious sounds auscultated. Abdominal: Soft, without distention.  Non-tender throughout.  Bowel sounds are normoactive.   GU: Rectal exam deferred. MSK: Extremities without cyanosis and with good capillary refill.  Trace edema bilaterally.  The patient does have tenderness in her right paraspinal region and some point tenderness to mid lumbar L3-4 range.  She did not tolerate a more detailed hip exam due to pain.  Skin: Without icterus, rash, or other lesions.  No significant  wounds. Neuropsychiatric: Good attention.  No obvious motor deficits.  Good insight into situation and disease process.  Capable of making medical decisions.  Worried affect; concerned about the possibility of malignancy.    Labs on Admission:  Basic Metabolic Panel:  Recent Labs Lab 07/12/13 1615  NA 138  K 3.7  CL 101  CO2 26  GLUCOSE 102*  BUN 18  CREATININE 0.69  CALCIUM 10.4   Liver Function Tests: No results found for this basename: AST, ALT, ALKPHOS, BILITOT, PROT, ALBUMIN,  in the last 168 hours No results found for this basename: LIPASE, AMYLASE,  in the last 168 hours No results found for this basename: AMMONIA,  in the last 168 hours CBC:  Recent Labs Lab 07/12/13 1615  WBC 8.8  NEUTROABS 6.3  HGB 11.9*  HCT 37.2  MCV 85.3  PLT 324   Cardiac Enzymes: No results found for this basename: CKTOTAL, CKMB, CKMBINDEX, TROPONINI,  in the last 168 hours  BNP (last 3 results) No results found for this basename: PROBNP,  in the last 8760 hours CBG: No  results found for this basename: GLUCAP,  in the last 168 hours  Radiological Exams on Admission: No results found.   Assessment/Plan Active Problems:   Lumbar compression fracture   Compression fracture  (hospital diagnoses bolded)  1. Lumbar compression fracture with refractory pain.  The patient has undergone advanced imaging of the lumbar spine: This is not able to be viewed by me in our system but the neurosurgical team as reviewed the images. The findings are at L3 there is evidence of a pathological appearing vertebral body fracture with some reactive change of her right psoas muscle around the area. The superior aspect of the vertebral body has an appearance possibly consistent with hemangioma. Overall it is unclear whether this is a pathologic fracture.  The patient is up-to-date on her cancer screening in terms of colonoscopy and mammography.  She does have a sister with breast cancer age 43 in an aunt with  breast cancer in her 37s. She denies any breast lumps.  Obtain CT scan of the chest, abdomen, pelvis to evaluate for possible disseminated malignant disease  Neurosurgery has been consulted, and they recommend vertebral plasty which will likely be done on 5/18. This will be done with a bone biopsy if possible.  Calcium is borderline 10.4; patient does report a history of hypercalcemia, which is of interest considering the workup for possible malignancy. This will be monitored closely.  For refractory pain, continue patient's basal pain medication namely acetaminophen hydrocodone every 4 hours and allow hydromorphone when necessary for severe pain; depending on the patient's progress may need a long acting pain medication prior to discharge 2. Dehydration secondary to pain causing poor ambulation and PO intake. Provide IV fluids and monitoring of electrolytes. 3. Constipation likely related to narcotic use. Provide aggressive bowel regimen including senna, docusate scheduled. Sorbitol once now and attempt to correct constipation. 4. Anxiety: Continue benzodiazepines when necessary. 5. Hypertension. Overtreated at this time considering dehydration.  Hold ACE inhibitor and restart when clinically appropriate. 6. Chronic cough; possible chronic bronchitis per patient's history. This may be related to an allergic rhinosinusitis which may benefit from intranasal steroids at discharge. 7. Gastroesophageal reflux disease. Continue PPI. 8. Hyperlipidemia does not need inpatient treatment. 9. Diet regular 10. Prophylaxis LMW H.  Neurosurgery formally consulted by ED providers with recommendations documented.  Code Status: After discussion, pt wishes to be full code.  Family Communication: Not needed; pt with good insight and capacity for decision making.  Disposition Plan: Inpatient admission.   Time spent: 53 minutes [facility] Pager [phone]

## 2013-07-12 NOTE — ED Notes (Signed)
Pt reports lower back pain x1 month - pt states she is scheduled to have a procedure done on her lower back by neurosurgery soon however is unable to tolerate the progressively worsening pain. Pt denies urinary symptoms and admits to hx of kidney stones however states this feels different. Pt denies any known mechanism of injury.

## 2013-07-13 ENCOUNTER — Other Ambulatory Visit: Payer: Self-pay

## 2013-07-13 ENCOUNTER — Encounter (HOSPITAL_COMMUNITY): Payer: Self-pay | Admitting: *Deleted

## 2013-07-13 DIAGNOSIS — K219 Gastro-esophageal reflux disease without esophagitis: Secondary | ICD-10-CM

## 2013-07-13 DIAGNOSIS — I10 Essential (primary) hypertension: Secondary | ICD-10-CM

## 2013-07-13 LAB — SURGICAL PCR SCREEN
MRSA, PCR: POSITIVE — AB
Staphylococcus aureus: POSITIVE — AB

## 2013-07-13 LAB — BASIC METABOLIC PANEL
BUN: 14 mg/dL (ref 6–23)
CO2: 25 mEq/L (ref 19–32)
Calcium: 9.9 mg/dL (ref 8.4–10.5)
Chloride: 103 mEq/L (ref 96–112)
Creatinine, Ser: 0.64 mg/dL (ref 0.50–1.10)
GFR calc Af Amer: >90 mL/min (ref >90)
GFR calc non Af Amer: >90 mL/min (ref >90)
Glucose, Bld: 89 mg/dL (ref 70–99)
Potassium: 3.9 mEq/L (ref 3.7–5.3)
Sodium: 140 mEq/L (ref 137–147)

## 2013-07-13 LAB — COMPREHENSIVE METABOLIC PANEL
ALT: 25 U/L (ref 0–35)
AST: 29 U/L (ref 0–37)
Albumin: 3.2 g/dL — ABNORMAL LOW (ref 3.5–5.2)
Alkaline Phosphatase: 112 U/L (ref 39–117)
BUN: 14 mg/dL (ref 6–23)
CO2: 24 mEq/L (ref 19–32)
Calcium: 10.3 mg/dL (ref 8.4–10.5)
Chloride: 103 mEq/L (ref 96–112)
Creatinine, Ser: 0.63 mg/dL (ref 0.50–1.10)
GFR calc Af Amer: >90 mL/min (ref >90)
GFR calc non Af Amer: >90 mL/min (ref >90)
Glucose, Bld: 159 mg/dL — ABNORMAL HIGH (ref 70–99)
Potassium: 3.8 mEq/L (ref 3.7–5.3)
Sodium: 140 mEq/L (ref 137–147)
Total Bilirubin: 0.3 mg/dL (ref 0.3–1.2)
Total Protein: 7.3 g/dL (ref 6.0–8.3)

## 2013-07-13 LAB — CBC
HCT: 35.4 % — ABNORMAL LOW (ref 36.0–46.0)
Hemoglobin: 10.9 g/dL — ABNORMAL LOW (ref 12.0–15.0)
MCH: 26.7 pg (ref 26.0–34.0)
MCHC: 30.8 g/dL (ref 30.0–36.0)
MCV: 86.6 fL (ref 78.0–100.0)
Platelets: 323 10*3/uL (ref 150–400)
RBC: 4.09 MIL/uL (ref 3.87–5.11)
RDW: 13.2 % (ref 11.5–15.5)
WBC: 6.6 10*3/uL (ref 4.0–10.5)

## 2013-07-13 MED ORDER — POLYETHYLENE GLYCOL 3350 17 G PO PACK
17.0000 g | PACK | Freq: Two times a day (BID) | ORAL | Status: DC
  Administered 2013-07-13 – 2013-07-17 (×7): 17 g via ORAL
  Filled 2013-07-13 (×10): qty 1

## 2013-07-13 MED ORDER — BUTALBITAL-APAP-CAFFEINE 50-325-40 MG PO TABS
1.0000 | ORAL_TABLET | Freq: Once | ORAL | Status: AC
Start: 2013-07-13 — End: 2013-07-13
  Administered 2013-07-13: 1 via ORAL
  Filled 2013-07-13: qty 1

## 2013-07-13 MED ORDER — PANTOPRAZOLE SODIUM 40 MG PO TBEC
40.0000 mg | DELAYED_RELEASE_TABLET | Freq: Every day | ORAL | Status: DC
  Administered 2013-07-13 – 2013-07-17 (×4): 40 mg via ORAL
  Filled 2013-07-13 (×4): qty 1

## 2013-07-13 MED ORDER — FLEET ENEMA 7-19 GM/118ML RE ENEM: 1.0000 | ENEMA | Freq: Every day | RECTAL | Status: DC | PRN

## 2013-07-13 NOTE — Evaluation (Signed)
Physical Therapy Evaluation Patient Details Name: Autumn Duran MRN: [id number] DOB: [birth year] Today's Date: 07/13/2013   History of Present Illness  63 yo female s/p L3 compression fracture of suspected pathological etiology. Scheduled for bone biopsy and vertebroplasty for 5/18.  Clinical Impression  Patient is seen for L3 compression fracture resulting in functional limitations due to the deficits listed below (see PT Problem List). Pt is at a supervision to min guard level with mobility at this time. She is scheduled for surgery tomorrow (5/18) for a vertebroplasty and will need follow-up PT for mobility training. The patient plans to d/c home at this time and will have 24 hour assist from family. Patient will benefit from skilled PT to increase their independence and safety with mobility.    Follow Up Recommendations No PT follow up ((May need to be updated post-op depending on status))    Equipment Recommendations  3in1 (PT);Other (comment) (pt reports she may be able to get wheels for her walker.)    Recommendations for Other Services OT consult     Precautions / Restrictions Precautions Precautions: Back Precaution Booklet Issued: Yes (comment) Precaution Comments: reviewed precautions Restrictions Weight Bearing Restrictions: No      Mobility  Bed Mobility Overal bed mobility: Needs Assistance Bed Mobility: Rolling;Sidelying to Sit Rolling: Min guard Sidelying to sit: Min guard;+2 for safety/equipment       General bed mobility comments: Min guard for safety with verbal cues and demonstration for technique. Educated on log roll technique for neurtral spine with bed mobility.  Transfers Overall transfer level: Needs assistance Equipment used: Rolling walker (2 wheeled) Transfers: Sit to/from Stand Sit to Stand: Independent;Supervision         General transfer comment: Supervision for safety. educated on safe sit<>stand with maintaining neutral posture. Cues for  hand placement. Performed from lowest bed setting and BSC.   Ambulation/Gait Ambulation/Gait assistance: Supervision Ambulation Distance (Feet): 15 Feet (x2) Assistive device: Rolling walker (2 wheeled) Gait Pattern/deviations: Decreased stride length Gait velocity: decreased   General Gait Details: pt with decreased stride length and relatively guarded gait. Reviewed safe mobility techniques with rolling walker to prevent bending/arching/twisting of spinal column. Overall moving very well and demonstrates correct use of rolling walker. report from nursing that pt was furniture walking prior to PT eval.  Stairs            Wheelchair Mobility    Modified Rankin (Stroke Patients Only)       Balance Overall balance assessment: Modified Independent                                           Pertinent Vitals/Pain Pt reports pain as "low" Patient repositioned in chair for comfort. Per nurse Mahala Menghini, no chair alarm is indicated for this pt.    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 Pt at min guard to supervision level. Reviewed role of PT after surgical procedure scheduled for tomorrow and discussed d/c plans.    Exercises        Assessment/Plan    PT Assessment Patient needs continued PT services  PT Diagnosis Abnormality of gait;Acute pain   PT Problem List Decreased strength;Decreased range of motion;Decreased activity tolerance;Decreased balance;Decreased mobility;Decreased safety awareness;Decreased knowledge of precautions;Pain  PT Treatment Interventions DME instruction;Gait training;Stair training;Functional mobility training;Therapeutic activities;Therapeutic exercise;Balance training;Neuromuscular re-education;Patient/family education;Modalities   PT Goals (Current goals can be found in the Care Plan section) Acute Rehab PT Goals Patient Stated Goal: "get better" PT Goal Formulation: With patient Time For Goal Achievement: 07/20/13 Potential to Achieve Goals: Good    Frequency Min 5X/week   Barriers to discharge        Co-evaluation               End of Session   Activity Tolerance: Patient tolerated treatment well Patient left: in chair;with call bell/phone within reach;Other (comment) (per nurse Mahala Menghini - no chair alarm indicated for safety) Nurse Communication: Mobility status         Time: 1610-9604 PT Time Calculation (min): 36 min   Charges:   PT Evaluation $Initial PT Evaluation Tier I: 1 Procedure PT Treatments $Therapeutic Activity: 8-22 mins $Self Care/Home Management: 8-22   PT G Codes:         Elayne Snare, [place] 07/13/2013, 3:18 PM

## 2013-07-13 NOTE — Progress Notes (Signed)
Late Entry- Patient was admitted to floor via stretcher at 07/12/2013, 2040. Patient skin assessed and patient has reddened circular abrasions that she states "she picks at" throughout, but otherwise skin is intact. Patient can ambulate with 1 assist. Patient alert and oriented to room and call bell system. Patient was given booklet on patient information and also given incentive spirometer. She was educated on how to use it and what it used for.  Will continue to monitor closely.

## 2013-07-13 NOTE — Progress Notes (Signed)
PATIENT DETAILS Name: Autumn Duran Age: 63 y.o. Sex: female Date of Birth: [birth year] Admit Date: 07/12/2013 Admitting Physician Fuller Plan, MD YQM:VHQI,ONGEX B., MD  Subjective: Continues to have back pain  Assessment/Plan: Active Problems:   Lumbar compression fracture - Suspected pathological fracture of third lumbar vertebra-etiology not known at this time. CT of the chest and abdomen negative for obvious malignancy except for soft tissue expansion around the health day-L4 disc space-not known whether this is a hemangioma, possible tumor or infection. Seen by neurosurgery, for bone biopsy and vertebroplasty on 5/18. - Has borderline hypercalcemia, check albumin level- may need SPEP/UPEP - Continue with narcotics for pain control. Will need PT evaluation at some point as well.  Hypertension - Blood pressure currently controlled without the use of any antihypertensive medications.  GERD - Continue PPI-was on Prilosec prior to admission.  Constipation - Secondary to narcotics. Will place on bowel regimen with MiraLax and Senokot, as needed sorbitol and Fleet enema  Disposition: Remain inpatient  DVT Prophylaxis: Prophylactic Lovenox   Code Status: Full code   Family Communication - Family member at bedside.  Procedures:  None  CONSULTS:  Neurosurgery  Time spent 40 minutes-which includes 50% of the time with face-to-face with patient/ family and coordinating care related to the above assessment and plan.    MEDICATIONS: Scheduled Meds: . docusate sodium  100 mg Oral BID  . enoxaparin (LOVENOX) injection  40 mg Subcutaneous Q24H  . mupirocin ointment   Nasal BID  . polyethylene glycol  17 g Oral BID  . senna  1 tablet Oral BID   Continuous Infusions:  PRN Meds:.diazepam, HYDROcodone-acetaminophen, HYDROmorphone (DILAUDID) injection, ondansetron (ZOFRAN) IV, ondansetron, sodium phosphate, sorbitol  Antibiotics: Anti-infectives   Start      Dose/Rate Route Frequency Ordered Stop   07/13/13 0600  ceFAZolin (ANCEF) IVPB 2 g/50 mL premix     2 g 100 mL/hr over 30 Minutes Intravenous On call to O.R. 07/12/13 2108 07/13/13 0623       PHYSICAL EXAM: Vital signs in last 24 hours: Filed Vitals:   07/13/13 0218 07/13/13 0529 07/13/13 0830 07/13/13 1034  BP: 118/54 121/69 105/49 107/57  Pulse: 82 73 82 86  Temp: 99.2 F (37.3 C) 98.4 F (36.9 C) 98 F (36.7 C) 97.6 F (36.4 C)  TempSrc: Oral Oral Oral Oral  Resp: 18 20 18 16   Height:      Weight:      SpO2: 98% 98% 92% 97%    Weight change:  Filed Weights   07/12/13 1542 07/12/13 2043  Weight: 85.276 kg (188 lb) 85.684 kg (188 lb 14.4 oz)   Body mass index is 35.71 kg/(m^2).   Gen Exam: Awake and alert with clear speech.   Neck: Supple, No JVD.   Chest: B/L Clear.   CVS: S1 S2 Regular, no murmurs.  Abdomen: soft, BS +, non tender, non distended.  Extremities: no edema, lower extremities warm to touch. Neurologic: Non Focal.   Skin: No Rash.   Wounds: N/A.   Intake/Output from previous day:  Intake/Output Summary (Last 24 hours) at 07/13/13 1139 Last data filed at 07/13/13 0900  Gross per 24 hour  Intake   1211 ml  Output      0 ml  Net   1211 ml     LAB RESULTS: CBC  Recent Labs Lab 07/12/13 1615 07/13/13 0655  WBC 8.8 6.6  HGB 11.9* 10.9*  HCT 37.2 35.4*  PLT 324 323  MCV 85.3 86.6  MCH 27.3 26.7  MCHC 32.0 30.8  RDW 13.1 13.2  LYMPHSABS 1.8  --   MONOABS 0.7  --   EOSABS 0.1  --   BASOSABS 0.0  --     Chemistries   Recent Labs Lab 07/12/13 1615 07/13/13 0655  NA 138 140  K 3.7 3.9  CL 101 103  CO2 26 25  GLUCOSE 102* 89  BUN 18 14  CREATININE 0.69 0.64  CALCIUM 10.4 9.9    CBG: No results found for this basename: GLUCAP,  in the last 168 hours  GFR Estimated Creatinine Clearance: 72.5 ml/min (by C-G formula based on Cr of 0.64).  Coagulation profile No results found for this basename: INR, PROTIME,  in the last  168 hours  Cardiac Enzymes No results found for this basename: CK, CKMB, TROPONINI, MYOGLOBIN,  in the last 168 hours  No components found with this basename: POCBNP,  No results found for this basename: DDIMER,  in the last 72 hours No results found for this basename: HGBA1C,  in the last 72 hours No results found for this basename: CHOL, HDL, LDLCALC, TRIG, CHOLHDL, LDLDIRECT,  in the last 72 hours No results found for this basename: TSH, T4TOTAL, FREET3, T3FREE, THYROIDAB,  in the last 72 hours No results found for this basename: VITAMINB12, FOLATE, FERRITIN, TIBC, IRON, RETICCTPCT,  in the last 72 hours No results found for this basename: LIPASE, AMYLASE,  in the last 72 hours  Urine Studies No results found for this basename: UACOL, UAPR, USPG, UPH, UTP, UGL, UKET, UBIL, UHGB, UNIT, UROB, ULEU, UEPI, UWBC, URBC, UBAC, CAST, CRYS, UCOM, BILUA,  in the last 72 hours  MICROBIOLOGY: Recent Results (from the past 240 hour(s))  SURGICAL PCR SCREEN     Status: Abnormal   Collection Time    07/13/13  5:45 AM      Result Value Ref Range Status   MRSA, PCR POSITIVE (*) NEGATIVE Final   Comment: RESULT CALLED TO, READ BACK BY AND VERIFIED WITH:     J.PINEDA,RN [id number] 07/13/13 M.CAMPBELL   Staphylococcus aureus POSITIVE (*) NEGATIVE Final   Comment:            The Xpert SA Assay (FDA     approved for NASAL specimens     in patients over 18 years of age),     is one component of     a comprehensive surveillance     program.  Test performance has     been validated by [employer] for patients greater     than or equal to 76 year old.     It is not intended     to diagnose infection nor to     guide or monitor treatment.    RADIOLOGY STUDIES/RESULTS: Ct Chest W Contrast  07/12/2013   CLINICAL DATA:  Evaluate for tumor.  EXAM: CT CHEST, ABDOMEN, AND PELVIS WITH CONTRAST  TECHNIQUE: Multidetector CT imaging of the chest, abdomen and pelvis was performed following the standard  protocol during bolus administration of intravenous contrast.  CONTRAST:  120 cc of Omni 300  COMPARISON:  07/31/2011  FINDINGS: CT CHEST FINDINGS  The heart size is normal. There is no pericardial effusion. There are no enlarged mediastinal or hilar lymph nodes. Large hiatal hernia is identified. Dilated, fluid-filled esophagus is noted. No enlarged axillary or supraclavicular lymph nodes.  No pleural effusions identified. No airspace consolidation or atelectasis.  Review of the visualized bony structures is significant for mild thoracic spondylosis. Mild T7 wedge compression deformity is identified. This is unchanged from previous exam.  CT ABDOMEN AND PELVIS FINDINGS  Cyst is identified within the lateral segment of left hepatic lobe. There is no additional liver abnormality. Prior cholecystitis me. No biliary dilatation. Normal appearance of the pancreas. The spleen is normal. The adrenal glands are both normal. Normal appearance stress set tiny hypodensity within the upper pole the right kidney is too small to characterize. There is a small stone within the upper pole of the right kidney measuring 3 mm, image 54/series 2. Stone within the upper pole of the left kidney measures 3 mm, image 60/series 2. Renal sinus cysts identified within the upper pole of the left kidney. The urinary bladder appears normal. The uterus and adnexal structures are unremarkable.  Normal caliber of the abdominal aorta. No aneurysm. There is no upper abdominal adenopathy identified. No pelvic or inguinal adenopathy identified.  Fracture deformity involving the right side of the inferior endplate of the L3 vertebra is identified. There is asymmetric soft tissue expansion surrounding the L3 for disc space. This has mass effect upon the right neural foramina and effaces the right side of the ventral thecal sac. There is asymmetric enlargement of the right psoas muscle. No aggressive lytic or sclerotic bone lesions are identified involving  the remaining spine or bony pelvis.  IMPRESSION: 1. No primary malignancy identified within the chest, abdomen or pelvis. 2. Fracture involving the inferior endplate of the L3 vertebra is again noted. There is associated soft tissue expansion around the L3-4 disc space with the effacement of the right L3-4 neural foramina and ventral thecal sac. There is also asymmetric expansion of the right psoas muscle. These findings are nonspecific and although this may reflect underlying tumor, infection and hematoma secondary to fracture may also have a similar appearance. Tissue sampling may be necessary to confirm this diagnosis.   Electronically Signed   By: Kerby Moors M.D.   On: 07/12/2013 20:50   Ct Abdomen Pelvis W Contrast  07/12/2013   CLINICAL DATA:  Evaluate for tumor.  EXAM: CT CHEST, ABDOMEN, AND PELVIS WITH CONTRAST  TECHNIQUE: Multidetector CT imaging of the chest, abdomen and pelvis was performed following the standard protocol during bolus administration of intravenous contrast.  CONTRAST:  120 cc of Omni 300  COMPARISON:  07/31/2011  FINDINGS: CT CHEST FINDINGS  The heart size is normal. There is no pericardial effusion. There are no enlarged mediastinal or hilar lymph nodes. Large hiatal hernia is identified. Dilated, fluid-filled esophagus is noted. No enlarged axillary or supraclavicular lymph nodes.  No pleural effusions identified. No airspace consolidation or atelectasis. Review of the visualized bony structures is significant for mild thoracic spondylosis. Mild T7 wedge compression deformity is identified. This is unchanged from previous exam.  CT ABDOMEN AND PELVIS FINDINGS  Cyst is identified within the lateral segment of left hepatic lobe. There is no additional liver abnormality. Prior cholecystitis me. No biliary dilatation. Normal appearance of the pancreas. The spleen is normal. The adrenal glands are both normal. Normal appearance stress set tiny hypodensity within the upper pole the  right kidney is too small to characterize. There is a small stone within the upper pole of the right kidney measuring 3 mm, image 54/series 2. Stone within the upper pole of the left kidney measures 3 mm, image 60/series 2. Renal sinus cysts identified within the upper pole of the left kidney. The urinary  bladder appears normal. The uterus and adnexal structures are unremarkable.  Normal caliber of the abdominal aorta. No aneurysm. There is no upper abdominal adenopathy identified. No pelvic or inguinal adenopathy identified.  Fracture deformity involving the right side of the inferior endplate of the L3 vertebra is identified. There is asymmetric soft tissue expansion surrounding the L3 for disc space. This has mass effect upon the right neural foramina and effaces the right side of the ventral thecal sac. There is asymmetric enlargement of the right psoas muscle. No aggressive lytic or sclerotic bone lesions are identified involving the remaining spine or bony pelvis.  IMPRESSION: 1. No primary malignancy identified within the chest, abdomen or pelvis. 2. Fracture involving the inferior endplate of the L3 vertebra is again noted. There is associated soft tissue expansion around the L3-4 disc space with the effacement of the right L3-4 neural foramina and ventral thecal sac. There is also asymmetric expansion of the right psoas muscle. These findings are nonspecific and although this may reflect underlying tumor, infection and hematoma secondary to fracture may also have a similar appearance. Tissue sampling may be necessary to confirm this diagnosis.   Electronically Signed   By: Kerby Moors M.D.   On: 07/12/2013 20:50    Shanker Kristeen Mans, MD  [facility] Pager:[phone]  If 7PM-7AM, please contact night-coverage [email or web address] Password [email or web address] 07/13/2013, 11:39 AM   LOS: 1 day   **Disclaimer: This note may have been dictated with voice recognition software. Similar sounding words can  inadvertently be transcribed and this note may contain transcription errors which may not have been corrected upon publication of note.**

## 2013-07-13 NOTE — Progress Notes (Signed)
Subjective: Patient reports much better in terms of pain  Objective: Vital signs in last 24 hours: Temp:  [97.6 F (36.4 C)-99.2 F (37.3 C)] 97.6 F (36.4 C) (05/17 1034) Pulse Rate:  [67-87] 86 (05/17 1034) Resp:  [15-20] 16 (05/17 1034) BP: (95-121)/(40-69) 107/57 mmHg (05/17 1034) SpO2:  [91 %-99 %] 97 % (05/17 1034) Weight:  [85.276 kg (188 lb)-85.684 kg (188 lb 14.4 oz)] 85.684 kg (188 lb 14.4 oz) (05/16 2043)  Intake/Output from previous day: 05/16 0701 - 05/17 0700 In: 1211 [I.V.:711; IV Piggyback:500] Out: -  Intake/Output this shift:    Physical Exam: Full strength both lower extremities.  Denies numbness. Resting comfortably in bed.  Lab Results:  Recent Labs  07/12/13 1615 07/13/13 0655  WBC 8.8 6.6  HGB 11.9* 10.9*  HCT 37.2 35.4*  PLT 324 323   BMET  Recent Labs  07/13/13 0655 07/13/13 1045  NA 140 140  K 3.9 3.8  CL 103 103  CO2 25 24  GLUCOSE 89 159*  BUN 14 14  CREATININE 0.64 0.63  CALCIUM 9.9 10.3    Studies/Results: Ct Chest W Contrast  07/12/2013   CLINICAL DATA:  Evaluate for tumor.  EXAM: CT CHEST, ABDOMEN, AND PELVIS WITH CONTRAST  TECHNIQUE: Multidetector CT imaging of the chest, abdomen and pelvis was performed following the standard protocol during bolus administration of intravenous contrast.  CONTRAST:  120 cc of Omni 300  COMPARISON:  07/31/2011  FINDINGS: CT CHEST FINDINGS  The heart size is normal. There is no pericardial effusion. There are no enlarged mediastinal or hilar lymph nodes. Large hiatal hernia is identified. Dilated, fluid-filled esophagus is noted. No enlarged axillary or supraclavicular lymph nodes.  No pleural effusions identified. No airspace consolidation or atelectasis. Review of the visualized bony structures is significant for mild thoracic spondylosis. Mild T7 wedge compression deformity is identified. This is unchanged from previous exam.  CT ABDOMEN AND PELVIS FINDINGS  Cyst is identified within the lateral  segment of left hepatic lobe. There is no additional liver abnormality. Prior cholecystitis me. No biliary dilatation. Normal appearance of the pancreas. The spleen is normal. The adrenal glands are both normal. Normal appearance stress set tiny hypodensity within the upper pole the right kidney is too small to characterize. There is a small stone within the upper pole of the right kidney measuring 3 mm, image 54/series 2. Stone within the upper pole of the left kidney measures 3 mm, image 60/series 2. Renal sinus cysts identified within the upper pole of the left kidney. The urinary bladder appears normal. The uterus and adnexal structures are unremarkable.  Normal caliber of the abdominal aorta. No aneurysm. There is no upper abdominal adenopathy identified. No pelvic or inguinal adenopathy identified.  Fracture deformity involving the right side of the inferior endplate of the L3 vertebra is identified. There is asymmetric soft tissue expansion surrounding the L3 for disc space. This has mass effect upon the right neural foramina and effaces the right side of the ventral thecal sac. There is asymmetric enlargement of the right psoas muscle. No aggressive lytic or sclerotic bone lesions are identified involving the remaining spine or bony pelvis.  IMPRESSION: 1. No primary malignancy identified within the chest, abdomen or pelvis. 2. Fracture involving the inferior endplate of the L3 vertebra is again noted. There is associated soft tissue expansion around the L3-4 disc space with the effacement of the right L3-4 neural foramina and ventral thecal sac. There is also asymmetric expansion of the  right psoas muscle. These findings are nonspecific and although this may reflect underlying tumor, infection and hematoma secondary to fracture may also have a similar appearance. Tissue sampling may be necessary to confirm this diagnosis.   Electronically Signed   By: Kerby Moors M.D.   On: 07/12/2013 20:50   Ct  Abdomen Pelvis W Contrast  07/12/2013   CLINICAL DATA:  Evaluate for tumor.  EXAM: CT CHEST, ABDOMEN, AND PELVIS WITH CONTRAST  TECHNIQUE: Multidetector CT imaging of the chest, abdomen and pelvis was performed following the standard protocol during bolus administration of intravenous contrast.  CONTRAST:  120 cc of Omni 300  COMPARISON:  07/31/2011  FINDINGS: CT CHEST FINDINGS  The heart size is normal. There is no pericardial effusion. There are no enlarged mediastinal or hilar lymph nodes. Large hiatal hernia is identified. Dilated, fluid-filled esophagus is noted. No enlarged axillary or supraclavicular lymph nodes.  No pleural effusions identified. No airspace consolidation or atelectasis. Review of the visualized bony structures is significant for mild thoracic spondylosis. Mild T7 wedge compression deformity is identified. This is unchanged from previous exam.  CT ABDOMEN AND PELVIS FINDINGS  Cyst is identified within the lateral segment of left hepatic lobe. There is no additional liver abnormality. Prior cholecystitis me. No biliary dilatation. Normal appearance of the pancreas. The spleen is normal. The adrenal glands are both normal. Normal appearance stress set tiny hypodensity within the upper pole the right kidney is too small to characterize. There is a small stone within the upper pole of the right kidney measuring 3 mm, image 54/series 2. Stone within the upper pole of the left kidney measures 3 mm, image 60/series 2. Renal sinus cysts identified within the upper pole of the left kidney. The urinary bladder appears normal. The uterus and adnexal structures are unremarkable.  Normal caliber of the abdominal aorta. No aneurysm. There is no upper abdominal adenopathy identified. No pelvic or inguinal adenopathy identified.  Fracture deformity involving the right side of the inferior endplate of the L3 vertebra is identified. There is asymmetric soft tissue expansion surrounding the L3 for disc space.  This has mass effect upon the right neural foramina and effaces the right side of the ventral thecal sac. There is asymmetric enlargement of the right psoas muscle. No aggressive lytic or sclerotic bone lesions are identified involving the remaining spine or bony pelvis.  IMPRESSION: 1. No primary malignancy identified within the chest, abdomen or pelvis. 2. Fracture involving the inferior endplate of the L3 vertebra is again noted. There is associated soft tissue expansion around the L3-4 disc space with the effacement of the right L3-4 neural foramina and ventral thecal sac. There is also asymmetric expansion of the right psoas muscle. These findings are nonspecific and although this may reflect underlying tumor, infection and hematoma secondary to fracture may also have a similar appearance. Tissue sampling may be necessary to confirm this diagnosis.   Electronically Signed   By: Kerby Moors M.D.   On: 07/12/2013 20:50    Assessment/Plan: Metastatic workup negative C/A/P CT scans.  Plan OR tomorrow with Dr. Annette Stable.  Will hold lovenox.    LOS: 1 day    Erline Levine, MD 07/13/2013, 12:45 PM

## 2013-07-14 ENCOUNTER — Inpatient Hospital Stay (HOSPITAL_COMMUNITY): Payer: BC Managed Care – PPO

## 2013-07-14 ENCOUNTER — Encounter (HOSPITAL_COMMUNITY)
Admission: EM | Disposition: A | Discharge status: Home Health | Payer: Self-pay | Source: Home / Self Care | Attending: Internal Medicine

## 2013-07-14 ENCOUNTER — Other Ambulatory Visit: Payer: Self-pay

## 2013-07-14 ENCOUNTER — Inpatient Hospital Stay (HOSPITAL_COMMUNITY): Payer: BC Managed Care – PPO | Admitting: Certified Registered Nurse Anesthetist

## 2013-07-14 ENCOUNTER — Ambulatory Visit (HOSPITAL_COMMUNITY): Admission: RE | Admit: 2013-07-14 | Payer: BC Managed Care – PPO | Source: Ambulatory Visit | Admitting: Neurosurgery

## 2013-07-14 ENCOUNTER — Encounter (HOSPITAL_COMMUNITY): Payer: Self-pay | Admitting: Certified Registered Nurse Anesthetist

## 2013-07-14 ENCOUNTER — Encounter (HOSPITAL_COMMUNITY): Payer: BC Managed Care – PPO | Admitting: Certified Registered Nurse Anesthetist

## 2013-07-14 DIAGNOSIS — K5900 Constipation, unspecified: Secondary | ICD-10-CM

## 2013-07-14 HISTORY — DX: Nausea with vomiting, unspecified: R11.2

## 2013-07-14 HISTORY — DX: Other complications of anesthesia, initial encounter: T88.59XA

## 2013-07-14 HISTORY — DX: Unspecified chronic bronchitis: J42

## 2013-07-14 HISTORY — DX: Other specified postprocedural states: Z98.890

## 2013-07-14 HISTORY — DX: Adverse effect of unspecified anesthetic, initial encounter: T41.45XA

## 2013-07-14 HISTORY — PX: VERTEBROPLASTY: SHX113

## 2013-07-14 SURGERY — VERTEBROPLASTY
Anesthesia: General

## 2013-07-14 MED ORDER — NEOSTIGMINE METHYLSULFATE 10 MG/10ML IV SOLN
INTRAVENOUS | Status: DC | PRN
  Administered 2013-07-14: 3 mg via INTRAVENOUS

## 2013-07-14 MED ORDER — ONDANSETRON HCL 4 MG/2ML IJ SOLN: 4.0000 mg | Freq: Once | INTRAMUSCULAR | Status: AC | PRN

## 2013-07-14 MED ORDER — HYDROMORPHONE HCL PF 1 MG/ML IJ SOLN
INTRAMUSCULAR | Status: AC
  Filled 2013-07-14: qty 1

## 2013-07-14 MED ORDER — HYDROMORPHONE HCL PF 1 MG/ML IJ SOLN
0.2500 mg | INTRAMUSCULAR | Status: DC | PRN
  Administered 2013-07-14 (×2): 0.5 mg via INTRAVENOUS

## 2013-07-14 MED ORDER — ROCURONIUM BROMIDE 100 MG/10ML IV SOLN
INTRAVENOUS | Status: DC | PRN
  Administered 2013-07-14: 20 mg via INTRAVENOUS

## 2013-07-14 MED ORDER — LIDOCAINE HCL (CARDIAC) 20 MG/ML IV SOLN
INTRAVENOUS | Status: AC
Start: 2013-07-14 — End: 2013-07-14
  Filled 2013-07-14: qty 5

## 2013-07-14 MED ORDER — LACTATED RINGERS IV SOLN
INTRAVENOUS | Status: DC | PRN
  Administered 2013-07-14: 16:00:00 via INTRAVENOUS

## 2013-07-14 MED ORDER — FENTANYL CITRATE 0.05 MG/ML IJ SOLN
INTRAMUSCULAR | Status: AC
Start: 2013-07-14 — End: 2013-07-15
  Filled 2013-07-14: qty 2

## 2013-07-14 MED ORDER — GLYCOPYRROLATE 0.2 MG/ML IJ SOLN
INTRAMUSCULAR | Status: DC | PRN
  Administered 2013-07-14: 0.6 mg via INTRAVENOUS

## 2013-07-14 MED ORDER — ONDANSETRON HCL 4 MG/2ML IJ SOLN
INTRAMUSCULAR | Status: AC
Start: 2013-07-14 — End: 2013-07-14
  Filled 2013-07-14: qty 2

## 2013-07-14 MED ORDER — CEFAZOLIN SODIUM-DEXTROSE 2-3 GM-% IV SOLR
INTRAVENOUS | Status: AC
  Filled 2013-07-14: qty 50

## 2013-07-14 MED ORDER — DEXAMETHASONE SODIUM PHOSPHATE 4 MG/ML IJ SOLN
INTRAMUSCULAR | Status: AC
  Filled 2013-07-14: qty 1

## 2013-07-14 MED ORDER — OXYCODONE-ACETAMINOPHEN 5-325 MG PO TABS
1.0000 | ORAL_TABLET | ORAL | Status: DC | PRN
  Administered 2013-07-14 – 2013-07-15 (×2): 1 via ORAL
  Filled 2013-07-14 (×3): qty 1

## 2013-07-14 MED ORDER — FENTANYL CITRATE 0.05 MG/ML IJ SOLN
25.0000 ug | INTRAMUSCULAR | Status: DC | PRN
  Administered 2013-07-14 (×2): 50 ug via INTRAVENOUS

## 2013-07-14 MED ORDER — PHENYLEPHRINE 40 MCG/ML (10ML) SYRINGE FOR IV PUSH (FOR BLOOD PRESSURE SUPPORT)
PREFILLED_SYRINGE | INTRAVENOUS | Status: AC
  Filled 2013-07-14: qty 10

## 2013-07-14 MED ORDER — FENTANYL CITRATE 0.05 MG/ML IJ SOLN
INTRAMUSCULAR | Status: AC
  Filled 2013-07-14: qty 5

## 2013-07-14 MED ORDER — MIDAZOLAM HCL 2 MG/2ML IJ SOLN
INTRAMUSCULAR | Status: AC
  Filled 2013-07-14: qty 2

## 2013-07-14 MED ORDER — DEXAMETHASONE SODIUM PHOSPHATE 10 MG/ML IJ SOLN
INTRAMUSCULAR | Status: DC | PRN
  Administered 2013-07-14: 4 mg via INTRAVENOUS

## 2013-07-14 MED ORDER — ALBUTEROL SULFATE HFA 108 (90 BASE) MCG/ACT IN AERS
INHALATION_SPRAY | RESPIRATORY_TRACT | Status: DC | PRN
  Administered 2013-07-14: 6 via RESPIRATORY_TRACT

## 2013-07-14 MED ORDER — IOHEXOL 300 MG/ML  SOLN
INTRAMUSCULAR | Status: DC | PRN
  Administered 2013-07-14: 50 mL via ORAL

## 2013-07-14 MED ORDER — PROPOFOL 10 MG/ML IV BOLUS
INTRAVENOUS | Status: DC | PRN
  Administered 2013-07-14: 150 mg via INTRAVENOUS

## 2013-07-14 MED ORDER — 0.9 % SODIUM CHLORIDE (POUR BTL) OPTIME
TOPICAL | Status: DC | PRN
Start: 2013-07-14 — End: 2013-07-14
  Administered 2013-07-14: 1000 mL

## 2013-07-14 MED ORDER — FENTANYL CITRATE 0.05 MG/ML IJ SOLN
INTRAMUSCULAR | Status: DC | PRN
  Administered 2013-07-14 (×5): 50 ug via INTRAVENOUS

## 2013-07-14 MED ORDER — OXYCODONE HCL 5 MG PO TABS
ORAL_TABLET | ORAL | Status: AC
  Filled 2013-07-14: qty 1

## 2013-07-14 MED ORDER — OXYCODONE HCL 5 MG/5ML PO SOLN: 5.0000 mg | Freq: Once | ORAL | Status: AC | PRN

## 2013-07-14 MED ORDER — ONDANSETRON HCL 4 MG/2ML IJ SOLN
INTRAMUSCULAR | Status: DC | PRN
  Administered 2013-07-14: 4 mg via INTRAVENOUS

## 2013-07-14 MED ORDER — EPHEDRINE SULFATE 50 MG/ML IJ SOLN
INTRAMUSCULAR | Status: AC
  Filled 2013-07-14: qty 1

## 2013-07-14 MED ORDER — MIDAZOLAM HCL 5 MG/5ML IJ SOLN
INTRAMUSCULAR | Status: DC | PRN
  Administered 2013-07-14: 2 mg via INTRAVENOUS

## 2013-07-14 MED ORDER — PROPOFOL 10 MG/ML IV BOLUS
INTRAVENOUS | Status: AC
  Filled 2013-07-14: qty 20

## 2013-07-14 MED ORDER — LIDOCAINE HCL (CARDIAC) 20 MG/ML IV SOLN
INTRAVENOUS | Status: DC | PRN
  Administered 2013-07-14: 80 mg via INTRAVENOUS

## 2013-07-14 MED ORDER — SODIUM CHLORIDE 0.9 % IJ SOLN
INTRAMUSCULAR | Status: AC
  Filled 2013-07-14: qty 10

## 2013-07-14 MED ORDER — SUCCINYLCHOLINE CHLORIDE 20 MG/ML IJ SOLN
INTRAMUSCULAR | Status: DC | PRN
  Administered 2013-07-14: 100 mg via INTRAVENOUS

## 2013-07-14 MED ORDER — SCOPOLAMINE 1 MG/3DAYS TD PT72
MEDICATED_PATCH | TRANSDERMAL | Status: AC
Start: 2013-07-14 — End: 2013-07-15
  Filled 2013-07-14: qty 1

## 2013-07-14 MED ORDER — SUCCINYLCHOLINE CHLORIDE 20 MG/ML IJ SOLN
INTRAMUSCULAR | Status: AC
  Filled 2013-07-14: qty 1

## 2013-07-14 MED ORDER — OXYCODONE HCL 5 MG PO TABS
5.0000 mg | ORAL_TABLET | Freq: Once | ORAL | Status: AC | PRN
Start: 2013-07-14 — End: 2013-07-14
  Administered 2013-07-14: 5 mg via ORAL

## 2013-07-14 SURGICAL SUPPLY — 44 items
ADH SKN CLS APL DERMABOND .7 (GAUZE/BANDAGES/DRESSINGS)
BANDAGE ADH SHEER 1  50/CT (GAUZE/BANDAGES/DRESSINGS) ×3 IMPLANT
BLADE 10 SAFETY STRL DISP (BLADE) ×3 IMPLANT
BLADE SURG 15 STRL LF DISP TIS (BLADE) ×1 IMPLANT
BLADE SURG 15 STRL SS (BLADE) ×3
BLADE SURG ROTATE 9660 (MISCELLANEOUS) IMPLANT
CEMENT BONE KYPHX HV R (Orthopedic Implant) ×3 IMPLANT
CEMENT KYPHON C01A KIT/MIXER (Cement) ×3 IMPLANT
CONT SPEC 4OZ CLIKSEAL STRL BL (MISCELLANEOUS) ×6 IMPLANT
DERMABOND ADVANCED (GAUZE/BANDAGES/DRESSINGS)
DERMABOND ADVANCED .7 DNX12 (GAUZE/BANDAGES/DRESSINGS) IMPLANT
DEVICE BIOPSY BONE KYPHX (INSTRUMENTS) ×3 IMPLANT
DRAPE C-ARM 42X72 X-RAY (DRAPES) ×3 IMPLANT
DRAPE INCISE IOBAN 66X45 STRL (DRAPES) ×3 IMPLANT
DRAPE LAPAROTOMY 100X72X124 (DRAPES) ×3 IMPLANT
DRAPE PROXIMA HALF (DRAPES) ×3 IMPLANT
DRESSING TELFA 8X3 (GAUZE/BANDAGES/DRESSINGS) IMPLANT
DRSG TELFA 3X8 NADH (GAUZE/BANDAGES/DRESSINGS) ×3 IMPLANT
DURAPREP 26ML APPLICATOR (WOUND CARE) ×6 IMPLANT
GAUZE SPONGE 4X4 16PLY XRAY LF (GAUZE/BANDAGES/DRESSINGS) ×3 IMPLANT
GLOVE ECLIPSE 9.0 STRL (GLOVE) ×3 IMPLANT
GLOVE EXAM NITRILE LRG STRL (GLOVE) IMPLANT
GLOVE EXAM NITRILE MD LF STRL (GLOVE) IMPLANT
GLOVE EXAM NITRILE XL STR (GLOVE) IMPLANT
GLOVE EXAM NITRILE XS STR PU (GLOVE) IMPLANT
GOWN STRL REUS W/ TWL LRG LVL3 (GOWN DISPOSABLE) ×1 IMPLANT
GOWN STRL REUS W/ TWL XL LVL3 (GOWN DISPOSABLE) ×1 IMPLANT
GOWN STRL REUS W/TWL 2XL LVL3 (GOWN DISPOSABLE) IMPLANT
GOWN STRL REUS W/TWL LRG LVL3 (GOWN DISPOSABLE) ×3
GOWN STRL REUS W/TWL XL LVL3 (GOWN DISPOSABLE) ×3
KIT BASIN OR (CUSTOM PROCEDURE TRAY) ×3 IMPLANT
KIT ROOM TURNOVER OR (KITS) ×3 IMPLANT
NEEDLE HYPO 25X1 1.5 SAFETY (NEEDLE) ×3 IMPLANT
NS IRRIG 1000ML POUR BTL (IV SOLUTION) ×3 IMPLANT
PACK EENT II TURBAN DRAPE (CUSTOM PROCEDURE TRAY) ×3 IMPLANT
PAD ARMBOARD 7.5X6 YLW CONV (MISCELLANEOUS) ×3 IMPLANT
STAPLER SKIN PROX WIDE 3.9 (STAPLE) ×3 IMPLANT
SUT VIC AB 2-0 CP2 18 (SUTURE) IMPLANT
SUT VIC AB 3-0 SH 8-18 (SUTURE) ×3 IMPLANT
SYR CONTROL 10ML LL (SYRINGE) ×3 IMPLANT
TOWEL OR 17X24 6PK STRL BLUE (TOWEL DISPOSABLE) ×3 IMPLANT
TOWEL OR 17X26 10 PK STRL BLUE (TOWEL DISPOSABLE) ×3 IMPLANT
TRAY KYPHOPAK 15/3 ONESTEP 1ST (MISCELLANEOUS) IMPLANT
TRAY KYPHOPAK 20/3 ONESTEP 1ST (MISCELLANEOUS) ×3 IMPLANT

## 2013-07-14 NOTE — Progress Notes (Signed)
Patient admitted to the hospital for pain control over the weekend. Patient with known L3 pathologic fracture of unknown etiology. CT scan of chest and abdomen failed to demonstrate an obvious source or malignancy. Patient to undergo right-sided L3 vertebral biopsy and methylmethacrylate vertebroplasty. Risks and benefits have been explained. Patient wishes to proceed.

## 2013-07-14 NOTE — Brief Op Note (Signed)
07/12/2013 - 07/14/2013  5:22 PM  PATIENT:  Autumn Duran  63 y.o. female  PRE-OPERATIVE DIAGNOSIS:  Lumbar Three Fracture  POST-OPERATIVE DIAGNOSIS:  Lumbar Three Fracture  PROCEDURE:  Procedure(s) with comments: VERTEBROPLASTY WITH LUMBAR THREE BIOPSY (N/A) - VERTEBROPLASTY WITH LUMBAR THREE BIOPSY  SURGEON:  Surgeon(s) and Role:    * Charlie Pitter, MD - Primary  PHYSICIAN ASSISTANT:   ASSISTANTS:    ANESTHESIA:   general  EBL:     BLOOD ADMINISTERED:none  DRAINS: none   LOCAL MEDICATIONS USED:  MARCAINE     SPECIMEN:  Biopsy / Limited Resection  DISPOSITION OF SPECIMEN:  PATHOLOGY  COUNTS:  YES  TOURNIQUET:  * No tourniquets in log *  DICTATION: .Dragon Dictation  PLAN OF CARE: Continue inpatient admission  PATIENT DISPOSITION:  PACU - hemodynamically stable.   Delay start of Pharmacological VTE agent (>24hrs) due to surgical blood loss or risk of bleeding: yes

## 2013-07-14 NOTE — Anesthesia Preprocedure Evaluation (Addendum)
Anesthesia Evaluation  Patient identified by MRN, date of birth, ID band Patient awake    Reviewed: Allergy & Precautions, H&P , NPO status , Patient's Chart, lab work & pertinent test results  History of Anesthesia Complications (+) PONV  Airway Mallampati: II TM Distance: >3 FB Neck ROM: Full    Dental  (+) Dental Advisory Given   Pulmonary  breath sounds clear to auscultation        Cardiovascular hypertension, Rhythm:Regular Rate:Normal     Neuro/Psych    GI/Hepatic Neg liver ROS, hiatal hernia, GERD-  ,  Endo/Other    Renal/GU Renal disease     Musculoskeletal   Abdominal   Peds  Hematology   Anesthesia Other Findings   Reproductive/Obstetrics                         Anesthesia Physical Anesthesia Plan  ASA: III  Anesthesia Plan: General   Post-op Pain Management:    Induction: Intravenous  Airway Management Planned: Oral ETT  Additional Equipment:   Intra-op Plan:   Post-operative Plan: Extubation in OR  Informed Consent: I have reviewed the patients History and Physical, chart, labs and discussed the procedure including the risks, benefits and alternatives for the proposed anesthesia with the patient or authorized representative who has indicated his/her understanding and acceptance.   Dental advisory given  Plan Discussed with: CRNA and Anesthesiologist  Anesthesia Plan Comments:         Anesthesia Quick Evaluation

## 2013-07-14 NOTE — Progress Notes (Signed)
UR complete.  Mostafa Yuan RN, MSN 

## 2013-07-14 NOTE — Progress Notes (Signed)
PATIENT DETAILS Name: Autumn Duran Age: 63 y.o. Sex: female Date of Birth: [birth year] Admit Date: 07/12/2013 Admitting Physician Fuller Plan, MD VHQ:IONG,EXBMW B., MD  Subjective: Continues to have back pain-but better than on admission, had 2 BM's yesterday. Anxious, but no major issues overnight.  Assessment/Plan: Active Problems:   Lumbar compression fracture - Suspected pathological fracture of third lumbar vertebra-etiology not known at this time. CT of the chest and abdomen negative for obvious malignancy except for soft tissue expansion around the health day-L4 disc space-not known whether this is a hemangioma, possible tumor or infection. Seen by neurosurgery, for bone biopsy and vertebroplasty on 5/18. - Has borderline hypercalcemia, SPEP/UPEP pending - Continue with narcotics for pain control. Appreciate PT eval  Hypertension - Blood pressure currently controlled without the use of any antihypertensive medications.  GERD - Continue PPI-was on Prilosec prior to admission.  Constipation - Secondary to narcotics. Continue bowel regimen with MiraLax and Senokot, as needed sorbitol and Fleet enema  Disposition: Remain inpatient  DVT Prophylaxis: Prophylactic Lovenox   Code Status: Full code   Family Communication - Sister at bedside.  Procedures:  None  CONSULTS:  Neurosurgery   MEDICATIONS: Scheduled Meds: . docusate sodium  100 mg Oral BID  . mupirocin ointment   Nasal BID  . pantoprazole  40 mg Oral Q1200  . polyethylene glycol  17 g Oral BID  . senna  1 tablet Oral BID   Continuous Infusions:  PRN Meds:.diazepam, HYDROcodone-acetaminophen, HYDROmorphone (DILAUDID) injection, ondansetron (ZOFRAN) IV, ondansetron, sodium phosphate, sorbitol  Antibiotics: Anti-infectives   Start     Dose/Rate Route Frequency Ordered Stop   07/13/13 0600  ceFAZolin (ANCEF) IVPB 2 g/50 mL premix     2 g 100 mL/hr over 30 Minutes Intravenous On call to  O.R. 07/12/13 2108 07/13/13 0623       PHYSICAL EXAM: Vital signs in last 24 hours: Filed Vitals:   07/13/13 1855 07/13/13 2216 07/14/13 0645 07/14/13 1114  BP: 106/55 110/49 116/48 112/49  Pulse: 71 73 70 69  Temp: 97.5 F (36.4 C) 97.4 F (36.3 C) 97.4 F (36.3 C) 97.8 F (36.6 C)  TempSrc: Oral Oral Oral Oral  Resp:  18 18 18   Height:      Weight:      SpO2: 97% 98% 95% 99%    Weight change:  Filed Weights   07/12/13 1542 07/12/13 2043  Weight: 85.276 kg (188 lb) 85.684 kg (188 lb 14.4 oz)   Body mass index is 35.71 kg/(m^2).   Gen Exam: Awake and alert with clear speech.   Neck: Supple, No JVD.   Chest: B/L Clear.   CVS: S1 S2 Regular, no murmurs.  Abdomen: soft, BS +, non tender, non distended.  Extremities: no edema, lower extremities warm to touch. Neurologic: Non Focal.   Skin: No Rash.   Wounds: N/A.   Intake/Output from previous day:  Intake/Output Summary (Last 24 hours) at 07/14/13 1120 Last data filed at 07/14/13 0700  Gross per 24 hour  Intake    120 ml  Output      0 ml  Net    120 ml     LAB RESULTS: CBC  Recent Labs Lab 07/12/13 1615 07/13/13 0655  WBC 8.8 6.6  HGB 11.9* 10.9*  HCT 37.2 35.4*  PLT 324 323  MCV 85.3 86.6  MCH 27.3 26.7  MCHC 32.0 30.8  RDW 13.1 13.2  LYMPHSABS 1.8  --  MONOABS 0.7  --   EOSABS 0.1  --   BASOSABS 0.0  --     Chemistries   Recent Labs Lab 07/12/13 1615 07/13/13 0655 07/13/13 1045  NA 138 140 140  K 3.7 3.9 3.8  CL 101 103 103  CO2 26 25 24   GLUCOSE 102* 89 159*  BUN 18 14 14   CREATININE 0.69 0.64 0.63  CALCIUM 10.4 9.9 10.3    CBG: No results found for this basename: GLUCAP,  in the last 168 hours  GFR Estimated Creatinine Clearance: 72.5 ml/min (by C-G formula based on Cr of 0.63).  Coagulation profile No results found for this basename: INR, PROTIME,  in the last 168 hours  Cardiac Enzymes No results found for this basename: CK, CKMB, TROPONINI, MYOGLOBIN,  in the last  168 hours  No components found with this basename: POCBNP,  No results found for this basename: DDIMER,  in the last 72 hours No results found for this basename: HGBA1C,  in the last 72 hours No results found for this basename: CHOL, HDL, LDLCALC, TRIG, CHOLHDL, LDLDIRECT,  in the last 72 hours No results found for this basename: TSH, T4TOTAL, FREET3, T3FREE, THYROIDAB,  in the last 72 hours No results found for this basename: VITAMINB12, FOLATE, FERRITIN, TIBC, IRON, RETICCTPCT,  in the last 72 hours No results found for this basename: LIPASE, AMYLASE,  in the last 72 hours  Urine Studies No results found for this basename: UACOL, UAPR, USPG, UPH, UTP, UGL, UKET, UBIL, UHGB, UNIT, UROB, ULEU, UEPI, UWBC, URBC, UBAC, CAST, CRYS, UCOM, BILUA,  in the last 72 hours  MICROBIOLOGY: Recent Results (from the past 240 hour(s))  SURGICAL PCR SCREEN     Status: Abnormal   Collection Time    07/13/13  5:45 AM      Result Value Ref Range Status   MRSA, PCR POSITIVE (*) NEGATIVE Final   Comment: RESULT CALLED TO, READ BACK BY AND VERIFIED WITH:     J.PINEDA,RN [id number] 07/13/13 M.CAMPBELL   Staphylococcus aureus POSITIVE (*) NEGATIVE Final   Comment:            The Xpert SA Assay (FDA     approved for NASAL specimens     in patients over 64 years of age),     is one component of     a comprehensive surveillance     program.  Test performance has     been validated by [employer] for patients greater     than or equal to 38 year old.     It is not intended     to diagnose infection nor to     guide or monitor treatment.    RADIOLOGY STUDIES/RESULTS: Ct Chest W Contrast  07/12/2013   CLINICAL DATA:  Evaluate for tumor.  EXAM: CT CHEST, ABDOMEN, AND PELVIS WITH CONTRAST  TECHNIQUE: Multidetector CT imaging of the chest, abdomen and pelvis was performed following the standard protocol during bolus administration of intravenous contrast.  CONTRAST:  120 cc of Omni 300  COMPARISON:  07/31/2011   FINDINGS: CT CHEST FINDINGS  The heart size is normal. There is no pericardial effusion. There are no enlarged mediastinal or hilar lymph nodes. Large hiatal hernia is identified. Dilated, fluid-filled esophagus is noted. No enlarged axillary or supraclavicular lymph nodes.  No pleural effusions identified. No airspace consolidation or atelectasis. Review of the visualized bony structures is significant for mild thoracic spondylosis. Mild T7 wedge compression  deformity is identified. This is unchanged from previous exam.  CT ABDOMEN AND PELVIS FINDINGS  Cyst is identified within the lateral segment of left hepatic lobe. There is no additional liver abnormality. Prior cholecystitis me. No biliary dilatation. Normal appearance of the pancreas. The spleen is normal. The adrenal glands are both normal. Normal appearance stress set tiny hypodensity within the upper pole the right kidney is too small to characterize. There is a small stone within the upper pole of the right kidney measuring 3 mm, image 54/series 2. Stone within the upper pole of the left kidney measures 3 mm, image 60/series 2. Renal sinus cysts identified within the upper pole of the left kidney. The urinary bladder appears normal. The uterus and adnexal structures are unremarkable.  Normal caliber of the abdominal aorta. No aneurysm. There is no upper abdominal adenopathy identified. No pelvic or inguinal adenopathy identified.  Fracture deformity involving the right side of the inferior endplate of the L3 vertebra is identified. There is asymmetric soft tissue expansion surrounding the L3 for disc space. This has mass effect upon the right neural foramina and effaces the right side of the ventral thecal sac. There is asymmetric enlargement of the right psoas muscle. No aggressive lytic or sclerotic bone lesions are identified involving the remaining spine or bony pelvis.  IMPRESSION: 1. No primary malignancy identified within the chest, abdomen or  pelvis. 2. Fracture involving the inferior endplate of the L3 vertebra is again noted. There is associated soft tissue expansion around the L3-4 disc space with the effacement of the right L3-4 neural foramina and ventral thecal sac. There is also asymmetric expansion of the right psoas muscle. These findings are nonspecific and although this may reflect underlying tumor, infection and hematoma secondary to fracture may also have a similar appearance. Tissue sampling may be necessary to confirm this diagnosis.   Electronically Signed   By: Kerby Moors M.D.   On: 07/12/2013 20:50   Ct Abdomen Pelvis W Contrast  07/12/2013   CLINICAL DATA:  Evaluate for tumor.  EXAM: CT CHEST, ABDOMEN, AND PELVIS WITH CONTRAST  TECHNIQUE: Multidetector CT imaging of the chest, abdomen and pelvis was performed following the standard protocol during bolus administration of intravenous contrast.  CONTRAST:  120 cc of Omni 300  COMPARISON:  07/31/2011  FINDINGS: CT CHEST FINDINGS  The heart size is normal. There is no pericardial effusion. There are no enlarged mediastinal or hilar lymph nodes. Large hiatal hernia is identified. Dilated, fluid-filled esophagus is noted. No enlarged axillary or supraclavicular lymph nodes.  No pleural effusions identified. No airspace consolidation or atelectasis. Review of the visualized bony structures is significant for mild thoracic spondylosis. Mild T7 wedge compression deformity is identified. This is unchanged from previous exam.  CT ABDOMEN AND PELVIS FINDINGS  Cyst is identified within the lateral segment of left hepatic lobe. There is no additional liver abnormality. Prior cholecystitis me. No biliary dilatation. Normal appearance of the pancreas. The spleen is normal. The adrenal glands are both normal. Normal appearance stress set tiny hypodensity within the upper pole the right kidney is too small to characterize. There is a small stone within the upper pole of the right kidney measuring  3 mm, image 54/series 2. Stone within the upper pole of the left kidney measures 3 mm, image 60/series 2. Renal sinus cysts identified within the upper pole of the left kidney. The urinary bladder appears normal. The uterus and adnexal structures are unremarkable.  Normal caliber of the abdominal  aorta. No aneurysm. There is no upper abdominal adenopathy identified. No pelvic or inguinal adenopathy identified.  Fracture deformity involving the right side of the inferior endplate of the L3 vertebra is identified. There is asymmetric soft tissue expansion surrounding the L3 for disc space. This has mass effect upon the right neural foramina and effaces the right side of the ventral thecal sac. There is asymmetric enlargement of the right psoas muscle. No aggressive lytic or sclerotic bone lesions are identified involving the remaining spine or bony pelvis.  IMPRESSION: 1. No primary malignancy identified within the chest, abdomen or pelvis. 2. Fracture involving the inferior endplate of the L3 vertebra is again noted. There is associated soft tissue expansion around the L3-4 disc space with the effacement of the right L3-4 neural foramina and ventral thecal sac. There is also asymmetric expansion of the right psoas muscle. These findings are nonspecific and although this may reflect underlying tumor, infection and hematoma secondary to fracture may also have a similar appearance. Tissue sampling may be necessary to confirm this diagnosis.   Electronically Signed   By: Kerby Moors M.D.   On: 07/12/2013 20:50    Shanker Kristeen Mans, MD  [facility] Pager:[phone]  If 7PM-7AM, please contact night-coverage [email or web address] Password [email or web address] 07/14/2013, 11:20 AM   LOS: 2 days   **Disclaimer: This note may have been dictated with voice recognition software. Similar sounding words can inadvertently be transcribed and this note may contain transcription errors which may not have been corrected upon  publication of note.**

## 2013-07-14 NOTE — Op Note (Signed)
Date of procedure: 07/14/2013  Date of dictation: Same  Service: Neurosurgery  Preoperative diagnosis: Pathologic L3 fracture etiology unknown  Postoperative diagnosis: Same  Procedure Name: Percutaneous L3 vertebral body biopsy.  L3 kyphoplasty/vertebroplasty  Surgeon:Shonya Sumida A.Sloane Palmer, M.D.  Asst. Surgeon: None  Anesthesia: General  Indication: 63 year old female without known major medical problems presents with severe worsening right lower lumbar pain. Workup demonstrates evidence of a significant L3 fracture with paraspinal extension and appearance consistent with neoplastic disease. Screening CT of chest abdomen and pelvis negative. Patient presents now for vertebral body biopsy and hopefully pain relief with methylmethacrylate vertebroplasty.  Operative note: After induction of anesthesia, patient positioned prone onto the Wilson frame and properly padded. Patient lumbar region prepped and draped sterilely. AP and lateral fluoroscopy was used throughout the procedure. Entry site into the right sided L3 pedicle was identified. A stab incision made. A Jamshidi needle introduced into the right L3 pedicle. This was advanced into the vertebral body. Core biopsy of the vertebral body was taken and sent to pathology. Kyphoplasty balloon inserted and inflated. Methylmethacrylate instilled into the vertebral body on the right side at L3 under near continuous AP and lateral fluoroscopic imaging. Good filling of the right-sided fracture site was achieved with minimal if any extravasation. No evidence of extravasation posteriorly or around the foramen on the right side. Needles withdrawn. No apparent complications. Incision closed and dressing placed.

## 2013-07-14 NOTE — Transfer of Care (Signed)
Immediate Anesthesia Transfer of Care Note  Patient: Autumn Duran  Procedure(s) Performed: Procedure(s) with comments: VERTEBROPLASTY WITH LUMBAR THREE BIOPSY (N/A) - VERTEBROPLASTY WITH LUMBAR THREE BIOPSY  Patient Location: PACU  Anesthesia Type:General  Level of Consciousness: awake and alert   Airway & Oxygen Therapy: Patient Spontanous Breathing and Patient connected to face mask oxygen  Post-op Assessment: Report given to PACU RN, Post -op Vital signs reviewed and stable and Patient moving all extremities X 4  Post vital signs: Reviewed and stable  Complications: No apparent anesthesia complications

## 2013-07-14 NOTE — Anesthesia Postprocedure Evaluation (Signed)
  Anesthesia Post-op Note  Patient: Autumn Duran  Procedure(s) Performed: Procedure(s) with comments: VERTEBROPLASTY WITH LUMBAR THREE BIOPSY (N/A) - VERTEBROPLASTY WITH LUMBAR THREE BIOPSY  Patient Location: PACU  Anesthesia Type:General  Level of Consciousness: awake, alert  and oriented  Airway and Oxygen Therapy: Patient Spontanous Breathing  Post-op Pain: mild  Post-op Assessment: Post-op Vital signs reviewed  Post-op Vital Signs: Reviewed  Last Vitals:  Filed Vitals:   07/14/13 1828  BP: 105/48  Pulse: 66  Temp:   Resp: 17    Complications: No apparent anesthesia complications

## 2013-07-15 ENCOUNTER — Encounter (HOSPITAL_COMMUNITY): Payer: Self-pay | Admitting: Neurosurgery

## 2013-07-15 MED ORDER — CYCLOBENZAPRINE HCL 10 MG PO TABS
5.0000 mg | ORAL_TABLET | Freq: Three times a day (TID) | ORAL | Status: DC | PRN
  Administered 2013-07-15 – 2013-07-16 (×4): 5 mg via ORAL
  Filled 2013-07-15 (×5): qty 1

## 2013-07-15 MED ORDER — ACETAMINOPHEN 325 MG PO TABS: 650.0000 mg | ORAL_TABLET | Freq: Four times a day (QID) | ORAL | Status: DC | PRN

## 2013-07-15 MED ORDER — OXYCODONE HCL 5 MG PO TABS
10.0000 mg | ORAL_TABLET | ORAL | Status: DC | PRN
  Administered 2013-07-15 – 2013-07-17 (×10): 10 mg via ORAL
  Filled 2013-07-15 (×10): qty 2

## 2013-07-15 NOTE — Progress Notes (Signed)
PATIENT DETAILS Name: Autumn Duran Age: 63 y.o. Sex: female Date of Birth: 08-06-1950 Admit Date: 07/12/2013 Admitting Physician Fuller Plan, MD KAJ:GOTL,XBWIO B., MD  Subjective: Back pain better-but claims pain has moved to the "right side"  Assessment/Plan: Active Problems:   Lumbar compression fracture - Suspected pathological fracture of third lumbar vertebra-etiology not known at this time. CT of the chest and abdomen negative for obvious malignancy except for soft tissue expansion around the health day-L4 disc space-not known whether this is a hemangioma, possible tumor or infection. Seen by neurosurgery, underwent bone biopsy and vertebroplasty on 07/14/13. Bx results still pending, pain is somewhat better, suspect can be discharged home if ok with neurosurgery. - Has borderline hypercalcemia, SPEP/UPEP pending - Continue with narcotics for pain control. Appreciate PT eval  Hypertension - Blood pressure currently controlled without the use of any antihypertensive medications.  GERD - Continue PPI-was on Prilosec prior to admission.  Constipation - Secondary to narcotics. Continue bowel regimen with MiraLax and Senokot, as needed sorbitol and Fleet enema  Disposition: Remain inpatient-?Home later today if ok with Neurosurgery  DVT Prophylaxis: Prophylactic Lovenox   Code Status: Full code   Family Communication - Sister at bedside.  Procedures:  None  CONSULTS:  Neurosurgery   MEDICATIONS: Scheduled Meds: . docusate sodium  100 mg Oral BID  . mupirocin ointment   Nasal BID  . pantoprazole  40 mg Oral Q1200  . polyethylene glycol  17 g Oral BID  . senna  1 tablet Oral BID   Continuous Infusions:  PRN Meds:.acetaminophen, cyclobenzaprine, diazepam, ondansetron (ZOFRAN) IV, ondansetron, oxyCODONE, sodium phosphate, sorbitol  Antibiotics: Anti-infectives   Start     Dose/Rate Route Frequency Ordered Stop   07/13/13 0600  ceFAZolin (ANCEF)  IVPB 2 g/50 mL premix     2 g 100 mL/hr over 30 Minutes Intravenous On call to O.R. 07/12/13 2108 07/13/13 0623       PHYSICAL EXAM: Vital signs in last 24 hours: Filed Vitals:   07/14/13 1907 07/14/13 2130 07/15/13 0122 07/15/13 0500  BP: 105/56 128/53 102/38 125/75  Pulse: 64 58 58 56  Temp: 97.9 F (36.6 C) 98.4 F (36.9 C) 98 F (36.7 C) 98.6 F (37 C)  TempSrc: Oral Oral Oral Oral  Resp: 16 18 18 16   Height:      Weight:      SpO2: 93% 100% 98% 99%    Weight change:  Filed Weights   07/12/13 1542 07/12/13 2043  Weight: 85.276 kg (188 lb) 85.684 kg (188 lb 14.4 oz)   Body mass index is 35.71 kg/(m^2).   Gen Exam: Awake and alert with clear speech.   Neck: Supple, No JVD.   Chest: B/L Clear.   CVS: S1 S2 Regular, no murmurs.  Abdomen: soft, BS +, non tender, non distended.  Extremities: no edema, lower extremities warm to touch. Neurologic: Non Focal.   Skin: No Rash.   Wounds: N/A.   Intake/Output from previous day:  Intake/Output Summary (Last 24 hours) at 07/15/13 0857 Last data filed at 07/14/13 1750  Gross per 24 hour  Intake   1100 ml  Output      0 ml  Net   1100 ml     LAB RESULTS: CBC  Recent Labs Lab 07/12/13 1615 07/13/13 0655  WBC 8.8 6.6  HGB 11.9* 10.9*  HCT 37.2 35.4*  PLT 324 323  MCV 85.3 86.6  MCH 27.3 26.7  MCHC  32.0 30.8  RDW 13.1 13.2  LYMPHSABS 1.8  --   MONOABS 0.7  --   EOSABS 0.1  --   BASOSABS 0.0  --     Chemistries   Recent Labs Lab 07/12/13 1615 07/13/13 0655 07/13/13 1045  NA 138 140 140  K 3.7 3.9 3.8  CL 101 103 103  CO2 26 25 24   GLUCOSE 102* 89 159*  BUN 18 14 14   CREATININE 0.69 0.64 0.63  CALCIUM 10.4 9.9 10.3    CBG: No results found for this basename: GLUCAP,  in the last 168 hours  GFR Estimated Creatinine Clearance: 72.5 ml/min (by C-G formula based on Cr of 0.63).  Coagulation profile No results found for this basename: INR, PROTIME,  in the last 168 hours  Cardiac  Enzymes No results found for this basename: CK, CKMB, TROPONINI, MYOGLOBIN,  in the last 168 hours  No components found with this basename: POCBNP,  No results found for this basename: DDIMER,  in the last 72 hours No results found for this basename: HGBA1C,  in the last 72 hours No results found for this basename: CHOL, HDL, LDLCALC, TRIG, CHOLHDL, LDLDIRECT,  in the last 72 hours No results found for this basename: TSH, T4TOTAL, FREET3, T3FREE, THYROIDAB,  in the last 72 hours No results found for this basename: VITAMINB12, FOLATE, FERRITIN, TIBC, IRON, RETICCTPCT,  in the last 72 hours No results found for this basename: LIPASE, AMYLASE,  in the last 72 hours  Urine Studies No results found for this basename: UACOL, UAPR, USPG, UPH, UTP, UGL, UKET, UBIL, UHGB, UNIT, UROB, ULEU, UEPI, UWBC, URBC, UBAC, CAST, CRYS, UCOM, BILUA,  in the last 72 hours  MICROBIOLOGY: Recent Results (from the past 240 hour(s))  SURGICAL PCR SCREEN     Status: Abnormal   Collection Time    07/13/13  5:45 AM      Result Value Ref Range Status   MRSA, PCR POSITIVE (*) NEGATIVE Final   Comment: RESULT CALLED TO, READ BACK BY AND VERIFIED WITH:     J.PINEDA,RN 1540 07/13/13 M.CAMPBELL   Staphylococcus aureus POSITIVE (*) NEGATIVE Final   Comment:            The Xpert SA Assay (FDA     approved for NASAL specimens     in patients over 41 years of age),     is one component of     a comprehensive surveillance     program.  Test performance has     been validated by [employer] for patients greater     than or equal to 6 year old.     It is not intended     to diagnose infection nor to     guide or monitor treatment.    RADIOLOGY STUDIES/RESULTS: Ct Chest W Contrast  07/12/2013   CLINICAL DATA:  Evaluate for tumor.  EXAM: CT CHEST, ABDOMEN, AND PELVIS WITH CONTRAST  TECHNIQUE: Multidetector CT imaging of the chest, abdomen and pelvis was performed following the standard protocol during bolus  administration of intravenous contrast.  CONTRAST:  120 cc of Omni 300  COMPARISON:  07/31/2011  FINDINGS: CT CHEST FINDINGS  The heart size is normal. There is no pericardial effusion. There are no enlarged mediastinal or hilar lymph nodes. Large hiatal hernia is identified. Dilated, fluid-filled esophagus is noted. No enlarged axillary or supraclavicular lymph nodes.  No pleural effusions identified. No airspace consolidation or atelectasis. Review of the  visualized bony structures is significant for mild thoracic spondylosis. Mild T7 wedge compression deformity is identified. This is unchanged from previous exam.  CT ABDOMEN AND PELVIS FINDINGS  Cyst is identified within the lateral segment of left hepatic lobe. There is no additional liver abnormality. Prior cholecystitis me. No biliary dilatation. Normal appearance of the pancreas. The spleen is normal. The adrenal glands are both normal. Normal appearance stress set tiny hypodensity within the upper pole the right kidney is too small to characterize. There is a small stone within the upper pole of the right kidney measuring 3 mm, image 54/series 2. Stone within the upper pole of the left kidney measures 3 mm, image 60/series 2. Renal sinus cysts identified within the upper pole of the left kidney. The urinary bladder appears normal. The uterus and adnexal structures are unremarkable.  Normal caliber of the abdominal aorta. No aneurysm. There is no upper abdominal adenopathy identified. No pelvic or inguinal adenopathy identified.  Fracture deformity involving the right side of the inferior endplate of the L3 vertebra is identified. There is asymmetric soft tissue expansion surrounding the L3 for disc space. This has mass effect upon the right neural foramina and effaces the right side of the ventral thecal sac. There is asymmetric enlargement of the right psoas muscle. No aggressive lytic or sclerotic bone lesions are identified involving the remaining spine  or bony pelvis.  IMPRESSION: 1. No primary malignancy identified within the chest, abdomen or pelvis. 2. Fracture involving the inferior endplate of the L3 vertebra is again noted. There is associated soft tissue expansion around the L3-4 disc space with the effacement of the right L3-4 neural foramina and ventral thecal sac. There is also asymmetric expansion of the right psoas muscle. These findings are nonspecific and although this may reflect underlying tumor, infection and hematoma secondary to fracture may also have a similar appearance. Tissue sampling may be necessary to confirm this diagnosis.   Electronically Signed   By: Kerby Moors M.D.   On: 07/12/2013 20:50   Ct Abdomen Pelvis W Contrast  07/12/2013   CLINICAL DATA:  Evaluate for tumor.  EXAM: CT CHEST, ABDOMEN, AND PELVIS WITH CONTRAST  TECHNIQUE: Multidetector CT imaging of the chest, abdomen and pelvis was performed following the standard protocol during bolus administration of intravenous contrast.  CONTRAST:  120 cc of Omni 300  COMPARISON:  07/31/2011  FINDINGS: CT CHEST FINDINGS  The heart size is normal. There is no pericardial effusion. There are no enlarged mediastinal or hilar lymph nodes. Large hiatal hernia is identified. Dilated, fluid-filled esophagus is noted. No enlarged axillary or supraclavicular lymph nodes.  No pleural effusions identified. No airspace consolidation or atelectasis. Review of the visualized bony structures is significant for mild thoracic spondylosis. Mild T7 wedge compression deformity is identified. This is unchanged from previous exam.  CT ABDOMEN AND PELVIS FINDINGS  Cyst is identified within the lateral segment of left hepatic lobe. There is no additional liver abnormality. Prior cholecystitis me. No biliary dilatation. Normal appearance of the pancreas. The spleen is normal. The adrenal glands are both normal. Normal appearance stress set tiny hypodensity within the upper pole the right kidney is too  small to characterize. There is a small stone within the upper pole of the right kidney measuring 3 mm, image 54/series 2. Stone within the upper pole of the left kidney measures 3 mm, image 60/series 2. Renal sinus cysts identified within the upper pole of the left kidney. The urinary bladder appears normal.  The uterus and adnexal structures are unremarkable.  Normal caliber of the abdominal aorta. No aneurysm. There is no upper abdominal adenopathy identified. No pelvic or inguinal adenopathy identified.  Fracture deformity involving the right side of the inferior endplate of the L3 vertebra is identified. There is asymmetric soft tissue expansion surrounding the L3 for disc space. This has mass effect upon the right neural foramina and effaces the right side of the ventral thecal sac. There is asymmetric enlargement of the right psoas muscle. No aggressive lytic or sclerotic bone lesions are identified involving the remaining spine or bony pelvis.  IMPRESSION: 1. No primary malignancy identified within the chest, abdomen or pelvis. 2. Fracture involving the inferior endplate of the L3 vertebra is again noted. There is associated soft tissue expansion around the L3-4 disc space with the effacement of the right L3-4 neural foramina and ventral thecal sac. There is also asymmetric expansion of the right psoas muscle. These findings are nonspecific and although this may reflect underlying tumor, infection and hematoma secondary to fracture may also have a similar appearance. Tissue sampling may be necessary to confirm this diagnosis.   Electronically Signed   By: Kerby Moors M.D.   On: 07/12/2013 20:50    Shanker Kristeen Mans, MD  [facility] Pager:[phone]  If 7PM-7AM, please contact night-coverage [email or web address] Password [email or web address] 07/15/2013, 8:57 AM   LOS: 3 days   **Disclaimer: This note may have been dictated with voice recognition software. Similar sounding words can inadvertently be  transcribed and this note may contain transcription errors which may not have been corrected upon publication of note.**

## 2013-07-15 NOTE — Evaluation (Signed)
Physical Therapy RE-Evaluation Patient Details Name: Autumn Duran MRN: [id number] DOB: 1950/03/17 Today's Date: 07/15/2013   History of Present Illness  63 yo female s/p L3 compression fracture of suspected pathological etiology. Scheduled for bone biopsy and vertebroplasty for 5/18. R iliac crest pain.  MD ordering bone scan to look for more lesions.    Clinical Impression  Pt is now POD #1 s/p vertebroplasty and bx of thoracic compression fx.  Pt is lethargic, but moving well with only min assist.  PT is now recommending HHPT and she will need a RW for d/c home.  She already has a bedside commode.   PT to follow acutely for deficits listed below.       Follow Up Recommendations Home health PT;Supervision for mobility/OOB    Equipment Recommendations  Rolling walker with 5" wheels    Recommendations for Other Services OT consult     Precautions / Restrictions Precautions Precautions: Fall;Back Precaution Booklet Issued: Yes (comment) Precaution Comments: reviewed precautions and lifting restrictions for comfort.       Mobility  Bed Mobility Overal bed mobility: Needs Assistance Bed Mobility: Rolling;Sidelying to Sit Rolling: Supervision Sidelying to sit: Min assist       General bed mobility comments: Min assist to support trunk during slow transition from sidelying to sitting.  Reinforced log roll with pt.   Transfers Overall transfer level: Needs assistance Equipment used: Rolling walker (2 wheeled) Transfers: Sit to/from Stand Sit to Stand: Min assist         General transfer comment: Min assist to support trunk during transition.  Pt reporting being sore and is slow to move.  Verbal cues also for safe hand placement.   Ambulation/Gait Ambulation/Gait assistance: Min assist Ambulation Distance (Feet): 80 Feet Assistive device: Rolling walker (2 wheeled) Gait Pattern/deviations: Step-through pattern;Shuffle Gait velocity: decreased Gait velocity  interpretation: Below normal speed for age/gender General Gait Details: Verbal cues for safe use of RW.  Slow, shuffling gait.          Balance Overall balance assessment: Needs assistance Sitting-balance support: Feet supported Sitting balance-Leahy Scale: Fair     Standing balance support: Bilateral upper extremity supported Standing balance-Leahy Scale: Poor                               Pertinent Vitals/Pain See vitals flow sheet.     Home Living Family/patient expects to be discharged to:: Private residence Living Arrangements: Alone Available Help at Discharge: Family;Available 24 hours/day Type of Home: Apartment Home Access: Level entry     Home Layout: Two level;Able to live on main level with bedroom/bathroom Home Equipment: Walker - standard;Bedside commode Additional Comments: Pt reports her walker does not have wheels, but she does have a bedside commode    Prior Function Level of Independence: Independent         Comments: works full time - desk job     Journalist, newspaper   Dominant Hand: Right    Extremity/Trunk Assessment   Upper Extremity Assessment: Overall WFL for tasks assessed           Lower Extremity Assessment: Generalized weakness      Cervical / Trunk Assessment: Normal  Communication   Communication: No difficulties  Cognition Arousal/Alertness: Lethargic Behavior During Therapy: WFL for tasks assessed/performed Overall Cognitive Status: Impaired/Different from baseline Area of Impairment: Attention;Memory;Following commands   Current Attention Level: Sustained Memory: Decreased short-term memory Following Commands: Follows one step  commands with increased time       General Comments: Pt generally groggy and slow to respond.      General Comments General comments (skin integrity, edema, etc.): Encouraged pt to stay sitting to eat her lunch (~1 hour) and asked her friend to let the nurse know when the friend  leaves as she is not currently on a chair alarm pad and will need to get back in the bed.           Assessment/Plan    PT Assessment Patient needs continued PT services  PT Diagnosis Abnormality of gait;Acute pain;Difficulty walking;Generalized weakness;Altered mental status   PT Problem List Decreased strength;Decreased activity tolerance;Decreased balance;Decreased mobility;Decreased cognition;Decreased knowledge of use of DME;Decreased safety awareness;Decreased knowledge of precautions;Pain  PT Treatment Interventions DME instruction;Gait training;Stair training;Functional mobility training;Therapeutic activities;Therapeutic exercise;Balance training;Neuromuscular re-education;Cognitive remediation;Patient/family education;Modalities   PT Goals (Current goals can be found in the Care Plan section) Acute Rehab PT Goals Patient Stated Goal: to get better and go home PT Goal Formulation: With patient Time For Goal Achievement: 07/22/13 Potential to Achieve Goals: Good    Frequency Min 5X/week    End of Session   Activity Tolerance: Patient limited by fatigue;Patient limited by pain Patient left: in chair;with call bell/phone within reach;with family/visitor present Nurse Communication: Mobility status         Time: 6754-4920 PT Time Calculation (min): 22 min  Charges:   PT Evaluation $Initial PT Evaluation Tier I: 1 Procedure PT Treatments $Gait Training: 8-22 mins         07/15/2013, 11:30 AM

## 2013-07-15 NOTE — Progress Notes (Signed)
Noticed that the pt's O2 sat in room air stays around 90's. Initiated to use incentive-spirometer more often, but still O2sat stays low. Pt mentioned about starting to have some productive cough, lung sounds are clear. Put her on nasal canula with O2 1 ltr/hr, sat stays above 96%. Still complaint more pain towards right hip.   Autumn Duran

## 2013-07-15 NOTE — Progress Notes (Signed)
Postop day 1. Patient states that her back pain is significantly improved following vertebroplasty yesterday. She is not having any radicular pain. She does note however that she is now having some pain around her right posterior iliac wing. His pain is not associated the numbness, paresthesias, or weakness. He does not radiate. It is constant. It is not associated with worsening with movement.  On exam she is awake and alert. She is oriented and appropriate. Motor and sensory function are intact.  Status post vertebral body biopsy and vertebroplasty. Overall patient doing well. She may be mobilized without restriction from my standpoint. I think it is appropriate to get a whole-body bone scan particularly with her right-sided iliac wing pain to evaluate for other lesions.

## 2013-07-16 ENCOUNTER — Inpatient Hospital Stay (HOSPITAL_COMMUNITY): Payer: BC Managed Care – PPO

## 2013-07-16 LAB — UIFE/LIGHT CHAINS/TP QN, 24-HR UR
Albumin, U: DETECTED
Alpha 1, Urine: DETECTED — AB
Alpha 2, Urine: DETECTED — AB
Beta, Urine: DETECTED — AB
Free Kappa Lt Chains,Ur: 13.8 mg/dL — ABNORMAL HIGH (ref 0.14–2.42)
Free Kappa/Lambda Ratio: 7.75 ratio (ref 2.04–10.37)
Free Lambda Lt Chains,Ur: 1.78 mg/dL — ABNORMAL HIGH (ref 0.02–0.67)
Gamma Globulin, Urine: DETECTED — AB
Total Protein, Urine: 16.3 mg/dL

## 2013-07-16 LAB — PROTEIN ELECTROPHORESIS, SERUM
Albumin ELP: 47.5 % — ABNORMAL LOW (ref 55.8–66.1)
Alpha-1-Globulin: 9 % — ABNORMAL HIGH (ref 2.9–4.9)
Alpha-2-Globulin: 13 % — ABNORMAL HIGH (ref 7.1–11.8)
Beta 2: 7 % — ABNORMAL HIGH (ref 3.2–6.5)
Beta Globulin: 7.4 % — ABNORMAL HIGH (ref 4.7–7.2)
Gamma Globulin: 16.1 % (ref 11.1–18.8)
M-Spike, %: NOT DETECTED g/dL
Total Protein ELP: 5.9 g/dL — ABNORMAL LOW (ref 6.0–8.3)

## 2013-07-16 MED ORDER — TECHNETIUM TC 99M MEDRONATE IV KIT
25.0000 | PACK | Freq: Once | INTRAVENOUS | Status: AC | PRN
  Administered 2013-07-16: 25 via INTRAVENOUS

## 2013-07-16 NOTE — Progress Notes (Signed)
No new issues or problems. Pain recently well-controlled. Much improved from preop but still significant. Mobility marginal for home discharge. Patient work with therapy today.  Afebrile. Vital stable. Motor and sensory exam intact.  Vertebral biopsy pending. Bone scan pending.  Overall doing well from my standpoint. Stable for discharge after bone scan if patient's mobility is adequate with therapy today.

## 2013-07-16 NOTE — Progress Notes (Signed)
Occupational Therapy Evaluation and Discharge Patient Details Name: Autumn Duran MRN: [id number] DOB: October 19, 1950 Today's Date: 07/16/2013    History of Present Illness 63 yo female s/p L3 compression fracture of suspected pathological etiology. Scheduled for bone biopsy and vertebroplasty for 5/18. R iliac crest pain.  MD ordering bone scan to look for more lesions.     Clinical Impression   PTA pt lived at home alone and was independent with ADLs and functional mobility. Pt reports that she has family support 24/7 upon d/c home. Education and training completed with pt and family for 3/3 back precautions and incorporating into ADLs. Educated pt on fall prevention and energy conservation principles. No further acute OT needs. Pt awaiting bone scan today.     Follow Up Recommendations  No OT follow up;Supervision/Assistance - 24 hour    Equipment Recommendations  None recommended by OT       Precautions / Restrictions Precautions Precautions: Fall;Back Precaution Booklet Issued: Yes (comment) Precaution Comments: Educated pt on 3/3 back precautions and incorporating into ADLs Required Braces or Orthoses: Spinal Brace Spinal Brace: Lumbar corset;Applied in sitting position Restrictions Weight Bearing Restrictions: No      Mobility Bed Mobility Overal bed mobility: Needs Assistance Bed Mobility: Rolling;Sidelying to Sit Rolling: Supervision Sidelying to sit: Min assist       General bed mobility comments: Min assist to support trunk during slow transition from sidelying to sitting.  Reinforced log roll with pt.   Transfers Overall transfer level: Needs assistance Equipment used: Rolling walker (2 wheeled)   Sit to Stand: Min guard Stand pivot transfers: Min guard       General transfer comment: No physical assist needed. Verbal cues for hand placement and safety with RW.     Balance     Sitting balance-Leahy Scale: Poor Sitting balance - Comments: Pt had difficulty  maintaining balance without one UE support     Standing balance-Leahy Scale: Poor                              ADL Overall ADL's : Needs assistance/impaired Eating/Feeding: Independent;Sitting   Grooming: Min guard;Standing   Upper Body Bathing: Supervision/ safety;Set up;Sitting   Lower Body Bathing: Sit to/from stand;Minimal assistance   Upper Body Dressing : Set up;Supervision/safety;Sitting   Lower Body Dressing: Moderate assistance;Sit to/from stand   Toilet Transfer: Min guard;BSC;RW;Ambulation   Toileting- Water quality scientist and Hygiene: Minimal assistance;Sit to/from stand;Cueing for back precautions Toileting - Clothing Manipulation Details (indicate cue type and reason): Educated pt on use of tongs and baby wipes for ease of toilet hygiene.  Tub/ Shower Transfer: Walk-in shower;Minimal assistance;Ambulation;Rolling walker;3 in 1 Tub/Shower Transfer Details (indicate cue type and reason): pt plans to sponge bathe until she is able to get upstairs to her shower Functional mobility during ADLs: Rolling walker;Min guard General ADL Comments: Pt hesitant for mobility this date and upon OT entry to room, O2 sats were 80%. Notified RN and told to increase to 2L oxygen. With sitting upright and mobility, pt had improved O2 sats (not dropping below 90). Pt transferred to recliner and reports increased comfort with sitting up.      Vision  Per pt report, no change from baseline.                   Perception Perception Perception Tested?: No   Praxis Praxis Praxis tested?: Within functional limits    Pertinent Vitals/Pain Pt reports pain  as moderate in back surgical area. Pt c/o soreness "all over." Notified RN and repositioned pt in recliner, applied ice pack to surgical site.      Hand Dominance Right   Extremity/Trunk Assessment Upper Extremity Assessment Upper Extremity Assessment: Overall WFL for tasks assessed   Lower Extremity  Assessment Lower Extremity Assessment: Defer to PT evaluation   Cervical / Trunk Assessment Cervical / Trunk Assessment: Normal   Communication Communication Communication: No difficulties   Cognition Arousal/Alertness: Lethargic Behavior During Therapy: WFL for tasks assessed/performed Overall Cognitive Status: Within Functional Limits for tasks assessed                     General Comments    Encouraged pt and family to call for RN assistance to return to bed, as pt does not have a chair alarm. Asked RN if pt required chair alarm and RN told OT that pt would be fine without.             Home Living Family/patient expects to be discharged to:: Private residence Living Arrangements: Alone Available Help at Discharge: Family;Available 24 hours/day;Friend(s) Type of Home: Apartment Home Access: Level entry     Home Layout: Two level;Able to live on main level with bedroom/bathroom (half bath on main floor) Alternate Level Stairs-Number of Steps: 14 Alternate Level Stairs-Rails: Right Bathroom Shower/Tub: Occupational psychologist: Standard     Home Equipment: Walker - standard;Bedside commode   Additional Comments: Pt reports her walker does not have wheels, but she does have a bedside commode      Prior Functioning/Environment Level of Independence: Independent        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

## 2013-07-16 NOTE — Progress Notes (Signed)
PT Cancellation Note  Patient Details Name: Autumn Duran MRN: [id number] DOB: 04-28-1950   Cancelled Treatment:    Reason Eval/Treat Not Completed: Patient declined, no reason specified.  Pt refusing to work with PT due to pain and soreness.  "Please don't make me today.  I am too sore." reporting R iliiac pain.  She is agreeable to have PT come back tomorrow.  RN informed of pain.   Thanks,    Barbarann Ehlers. [place], [place], DPT [phone]   07/16/2013, 4:56 PM

## 2013-07-16 NOTE — Care Management Note (Addendum)
    Page 1 of 2   07/17/2013     10:48:44 AM CARE MANAGEMENT NOTE 07/17/2013  Patient:  Autumn Duran, Autumn Duran   Account Number:  [id number]  Date Initiated:  07/14/2013  Documentation initiated by:  Lorne Skeens  Subjective/Objective Assessment:   patient admitted with lumbar compression fracture, possible malignancy. Scheduled for vertebroplasty with bone biopsy. Lives at home alone     Action/Plan:   Will follow for discharge needs pending PT/OT evals and physician orders   Anticipated DC Date:  07/16/2013   Anticipated DC Plan:  [place]  CM consult      Choice offered to / List presented to:  C-1 Patient   DME arranged  Vassie Moselle      DME agency  [facility] arranged  [place].   Status of service:  Completed, signed off Medicare Important Message given?   (If response is "NO", the following Medicare IM given date fields will be blank) Date Medicare IM given:   Date Additional Medicare IM given:    Discharge Disposition:    Per UR Regulation:  Reviewed for med. necessity/level of care/duration of stay  If discussed at [place] of Stay Meetings, dates discussed:    Comments:  07/17/13 [place], MSN, [place] with [facility] was notified that patient now has orders for RN and [facility] aide in addition to the previous PT order.  [facility] was notified of need for rolling walker prior to discharge home today.   07/16/13 0945 Lorne Skeens RN, MSN, CM- Met with patient to discuss home health needs. Patient is agreeable to HHPT and has chosen [facility]. Marie with [facility] was notified and accepted the referral for possible discharge home later today.

## 2013-07-16 NOTE — Progress Notes (Signed)
PATIENT DETAILS Name: Autumn Duran Age: 63 y.o. Sex: female Date of Birth: [birth year] Admit Date: 07/12/2013 Admitting Physician Fuller Plan, MD HDQ:QIWL,NLGXQ B., MD  Subjective: Back pain better-but he is she feels mildly sore all over. No focal weakness.    Assessment/Plan:     Lumbar compression fracture  - Suspected pathological fracture of third lumbar vertebra-etiology not known at this time. CT of the chest and abdomen negative for obvious malignancy except for soft tissue expansion around the health day-L4 disc space-not known whether this is a hemangioma, possible tumor or infection. Seen by neurosurgery, underwent bone biopsy and vertebroplasty on 07/14/13.   Bx results still pending, pain is somewhat better, her neurosurgery she is getting a whole body bone scan, pending SPEP/UPEP   - Continue with narcotics for pain control. Appreciate PT eval, will get home PT upon discharge.     Hypertension - Blood pressure currently controlled without the use of any antihypertensive medications.   GERD - Continue PPI-was on Prilosec prior to admission.   Constipation - Secondary to narcotics. Continue bowel regimen with MiraLax and Senokot, as needed sorbitol and Fleet enema    Disposition: With home PT once bone scan stable per neurosurgery     DVT Prophylaxis: Prophylactic Lovenox   Code Status: Full code   Family Communication - Sister at bedside.  Procedures:  None  CONSULTS:  Neurosurgery   MEDICATIONS: Scheduled Meds: . docusate sodium  100 mg Oral BID  . mupirocin ointment   Nasal BID  . pantoprazole  40 mg Oral Q1200  . polyethylene glycol  17 g Oral BID  . senna  1 tablet Oral BID   Continuous Infusions:  PRN Meds:.acetaminophen, cyclobenzaprine, diazepam, ondansetron (ZOFRAN) IV, ondansetron, oxyCODONE, sodium phosphate, sorbitol  Antibiotics: Anti-infectives   Start     Dose/Rate Route Frequency Ordered Stop   07/13/13 0600  ceFAZolin (ANCEF) IVPB 2 g/50 mL premix     2 g 100 mL/hr over 30 Minutes Intravenous On call to O.R. 07/12/13 2108 07/13/13 0623       PHYSICAL EXAM: Vital signs in last 24 hours: Filed Vitals:   07/15/13 1829 07/15/13 2105 07/16/13 0110 07/16/13 0628  BP: 113/49 111/51 103/49 110/62  Pulse: 84 61 66 64  Temp: 98.9 F (37.2 C) 98.4 F (36.9 C) 98.4 F (36.9 C) 98.7 F (37.1 C)  TempSrc: Oral Oral Oral Oral  Resp: 16 18 17 16   Height:      Weight:      SpO2: 94% 97% 95% 97%    Weight change:  Filed Weights   07/12/13 1542 07/12/13 2043  Weight: 85.276 kg (188 lb) 85.684 kg (188 lb 14.4 oz)   Body mass index is 35.71 kg/(m^2).   Gen Exam: Awake and alert with clear speech.   Neck: Supple, No JVD.   Chest: B/L Clear.   CVS: S1 S2 Regular, no murmurs.  Abdomen: soft, BS +, non tender, non distended.  Extremities: no edema, lower extremities warm to touch. Neurologic: Non Focal. Strength 5/5 in both lower extremities.  Skin: No Rash.   Wounds: N/A.   Intake/Output from previous day:  Intake/Output Summary (Last 24 hours) at 07/16/13 0956 Last data filed at 07/16/13 0900  Gross per 24 hour  Intake    720 ml  Output      0 ml  Net    720 ml     LAB RESULTS: CBC  Recent Labs Lab  07/12/13 1615 07/13/13 0655  WBC 8.8 6.6  HGB 11.9* 10.9*  HCT 37.2 35.4*  PLT 324 323  MCV 85.3 86.6  MCH 27.3 26.7  MCHC 32.0 30.8  RDW 13.1 13.2  LYMPHSABS 1.8  --   MONOABS 0.7  --   EOSABS 0.1  --   BASOSABS 0.0  --     Chemistries   Recent Labs Lab 07/12/13 1615 07/13/13 0655 07/13/13 1045  NA 138 140 140  K 3.7 3.9 3.8  CL 101 103 103  CO2 26 25 24   GLUCOSE 102* 89 159*  BUN 18 14 14   CREATININE 0.69 0.64 0.63  CALCIUM 10.4 9.9 10.3    CBG: No results found for this basename: GLUCAP,  in the last 168 hours  GFR Estimated Creatinine Clearance: 72.5 ml/min (by C-G formula based on Cr of 0.63).  Coagulation profile No results found  for this basename: INR, PROTIME,  in the last 168 hours  Cardiac Enzymes No results found for this basename: CK, CKMB, TROPONINI, MYOGLOBIN,  in the last 168 hours  No components found with this basename: POCBNP,  No results found for this basename: DDIMER,  in the last 72 hours No results found for this basename: HGBA1C,  in the last 72 hours No results found for this basename: CHOL, HDL, LDLCALC, TRIG, CHOLHDL, LDLDIRECT,  in the last 72 hours No results found for this basename: TSH, T4TOTAL, FREET3, T3FREE, THYROIDAB,  in the last 72 hours No results found for this basename: VITAMINB12, FOLATE, FERRITIN, TIBC, IRON, RETICCTPCT,  in the last 72 hours No results found for this basename: LIPASE, AMYLASE,  in the last 72 hours  Urine Studies No results found for this basename: UACOL, UAPR, USPG, UPH, UTP, UGL, UKET, UBIL, UHGB, UNIT, UROB, ULEU, UEPI, UWBC, URBC, UBAC, CAST, CRYS, UCOM, BILUA,  in the last 72 hours  MICROBIOLOGY: Recent Results (from the past 240 hour(s))  SURGICAL PCR SCREEN     Status: Abnormal   Collection Time    07/13/13  5:45 AM      Result Value Ref Range Status   MRSA, PCR POSITIVE (*) NEGATIVE Final   Comment: RESULT CALLED TO, READ BACK BY AND VERIFIED WITH:     J.PINEDA,RN [id number] 07/13/13 M.CAMPBELL   Staphylococcus aureus POSITIVE (*) NEGATIVE Final   Comment:            The Xpert SA Assay (FDA     approved for NASAL specimens     in patients over 93 years of age),     is one component of     a comprehensive surveillance     program.  Test performance has     been validated by [employer] for patients greater     than or equal to 41 year old.     It is not intended     to diagnose infection nor to     guide or monitor treatment.    RADIOLOGY STUDIES/RESULTS: Ct Chest W Contrast  07/12/2013   CLINICAL DATA:  Evaluate for tumor.  EXAM: CT CHEST, ABDOMEN, AND PELVIS WITH CONTRAST  TECHNIQUE: Multidetector CT imaging of the chest, abdomen and  pelvis was performed following the standard protocol during bolus administration of intravenous contrast.  CONTRAST:  120 cc of Omni 300  COMPARISON:  07/31/2011  FINDINGS: CT CHEST FINDINGS  The heart size is normal. There is no pericardial effusion. There are no enlarged mediastinal or hilar lymph nodes.  Large hiatal hernia is identified. Dilated, fluid-filled esophagus is noted. No enlarged axillary or supraclavicular lymph nodes.  No pleural effusions identified. No airspace consolidation or atelectasis. Review of the visualized bony structures is significant for mild thoracic spondylosis. Mild T7 wedge compression deformity is identified. This is unchanged from previous exam.  CT ABDOMEN AND PELVIS FINDINGS  Cyst is identified within the lateral segment of left hepatic lobe. There is no additional liver abnormality. Prior cholecystitis me. No biliary dilatation. Normal appearance of the pancreas. The spleen is normal. The adrenal glands are both normal. Normal appearance stress set tiny hypodensity within the upper pole the right kidney is too small to characterize. There is a small stone within the upper pole of the right kidney measuring 3 mm, image 54/series 2. Stone within the upper pole of the left kidney measures 3 mm, image 60/series 2. Renal sinus cysts identified within the upper pole of the left kidney. The urinary bladder appears normal. The uterus and adnexal structures are unremarkable.  Normal caliber of the abdominal aorta. No aneurysm. There is no upper abdominal adenopathy identified. No pelvic or inguinal adenopathy identified.  Fracture deformity involving the right side of the inferior endplate of the L3 vertebra is identified. There is asymmetric soft tissue expansion surrounding the L3 for disc space. This has mass effect upon the right neural foramina and effaces the right side of the ventral thecal sac. There is asymmetric enlargement of the right psoas muscle. No aggressive lytic or  sclerotic bone lesions are identified involving the remaining spine or bony pelvis.  IMPRESSION: 1. No primary malignancy identified within the chest, abdomen or pelvis. 2. Fracture involving the inferior endplate of the L3 vertebra is again noted. There is associated soft tissue expansion around the L3-4 disc space with the effacement of the right L3-4 neural foramina and ventral thecal sac. There is also asymmetric expansion of the right psoas muscle. These findings are nonspecific and although this may reflect underlying tumor, infection and hematoma secondary to fracture may also have a similar appearance. Tissue sampling may be necessary to confirm this diagnosis.   Electronically Signed   By: Kerby Moors M.D.   On: 07/12/2013 20:50   Ct Abdomen Pelvis W Contrast  07/12/2013   CLINICAL DATA:  Evaluate for tumor.  EXAM: CT CHEST, ABDOMEN, AND PELVIS WITH CONTRAST  TECHNIQUE: Multidetector CT imaging of the chest, abdomen and pelvis was performed following the standard protocol during bolus administration of intravenous contrast.  CONTRAST:  120 cc of Omni 300  COMPARISON:  07/31/2011  FINDINGS: CT CHEST FINDINGS  The heart size is normal. There is no pericardial effusion. There are no enlarged mediastinal or hilar lymph nodes. Large hiatal hernia is identified. Dilated, fluid-filled esophagus is noted. No enlarged axillary or supraclavicular lymph nodes.  No pleural effusions identified. No airspace consolidation or atelectasis. Review of the visualized bony structures is significant for mild thoracic spondylosis. Mild T7 wedge compression deformity is identified. This is unchanged from previous exam.  CT ABDOMEN AND PELVIS FINDINGS  Cyst is identified within the lateral segment of left hepatic lobe. There is no additional liver abnormality. Prior cholecystitis me. No biliary dilatation. Normal appearance of the pancreas. The spleen is normal. The adrenal glands are both normal. Normal appearance stress  set tiny hypodensity within the upper pole the right kidney is too small to characterize. There is a small stone within the upper pole of the right kidney measuring 3 mm, image 54/series 2. Stone within  the upper pole of the left kidney measures 3 mm, image 60/series 2. Renal sinus cysts identified within the upper pole of the left kidney. The urinary bladder appears normal. The uterus and adnexal structures are unremarkable.  Normal caliber of the abdominal aorta. No aneurysm. There is no upper abdominal adenopathy identified. No pelvic or inguinal adenopathy identified.  Fracture deformity involving the right side of the inferior endplate of the L3 vertebra is identified. There is asymmetric soft tissue expansion surrounding the L3 for disc space. This has mass effect upon the right neural foramina and effaces the right side of the ventral thecal sac. There is asymmetric enlargement of the right psoas muscle. No aggressive lytic or sclerotic bone lesions are identified involving the remaining spine or bony pelvis.  IMPRESSION: 1. No primary malignancy identified within the chest, abdomen or pelvis. 2. Fracture involving the inferior endplate of the L3 vertebra is again noted. There is associated soft tissue expansion around the L3-4 disc space with the effacement of the right L3-4 neural foramina and ventral thecal sac. There is also asymmetric expansion of the right psoas muscle. These findings are nonspecific and although this may reflect underlying tumor, infection and hematoma secondary to fracture may also have a similar appearance. Tissue sampling may be necessary to confirm this diagnosis.   Electronically Signed   By: Kerby Moors M.D.   On: 07/12/2013 20:50    Thurnell Lose M.D on 07/16/2013 at 9:56 AM  Between 7am to 7pm - Pager - [phone], After 7pm go to [email or web address] - password [email or web address]  And look for the night coverage person covering me after hours  [place]   LOS: 4 days   **Disclaimer: This note may have been dictated with voice recognition software. Similar sounding words can inadvertently be transcribed and this note may contain transcription errors which may not have been corrected upon publication of note.**

## 2013-07-16 NOTE — Progress Notes (Signed)
PT Cancellation Note  Patient Details Name: Autumn Duran MRN: [id number] DOB: 12/20/1950   Cancelled Treatment:    Reason Eval/Treat Not Completed: Patient at procedure or test/unavailable.  Pt at bone scan.  PT to check back later as time allows.   Thanks,    Barbarann Ehlers. [place], [place], DPT [phone]   07/16/2013, 3:35 PM

## 2013-07-17 MED ORDER — KETOROLAC TROMETHAMINE 30 MG/ML IJ SOLN
30.0000 mg | Freq: Four times a day (QID) | INTRAMUSCULAR | Status: DC
  Administered 2013-07-17: 30 mg via INTRAVENOUS
  Filled 2013-07-17: qty 1

## 2013-07-17 MED ORDER — POLYETHYLENE GLYCOL 3350 17 G PO PACK: 17.0000 g | PACK | Freq: Two times a day (BID) | ORAL | Status: DC

## 2013-07-17 MED ORDER — HYDROMORPHONE HCL PF 1 MG/ML IJ SOLN
INTRAMUSCULAR | Status: AC
  Filled 2013-07-17: qty 1

## 2013-07-17 MED ORDER — HYDROCODONE-ACETAMINOPHEN 5-325 MG PO TABS: 1.0000 | ORAL_TABLET | ORAL | Status: DC | PRN

## 2013-07-17 MED ORDER — CYCLOBENZAPRINE HCL 5 MG PO TABS: 5.0000 mg | ORAL_TABLET | Freq: Three times a day (TID) | ORAL | Status: DC | PRN

## 2013-07-17 MED ORDER — ONDANSETRON HCL 4 MG PO TABS: 4.0000 mg | ORAL_TABLET | Freq: Four times a day (QID) | ORAL | Status: DC | PRN

## 2013-07-17 MED ORDER — HYDROMORPHONE HCL PF 1 MG/ML IJ SOLN
0.5000 mg | Freq: Once | INTRAMUSCULAR | Status: AC
  Administered 2013-07-17: 0.5 mg via INTRAVENOUS

## 2013-07-17 NOTE — Progress Notes (Signed)
Physical Therapy Treatment Patient Details Name: Autumn Duran MRN: [id number] DOB: 01/03/1951 Today's Date: 07/17/2013    History of Present Illness 63 yo female s/p L3 compression fracture of suspected pathological etiology. Scheduled for bone biopsy and vertebroplasty for 5/18. R iliac crest pain.  MD ordering bone scan to look for more lesions.      PT Comments    Pt able to greatly progress mobility. Denies any pain after ambulation. Able to perform all mobility and ADLs in standing with supervision (A) for min cues for back precautions. Educated on proper car transfer technique. Pt will require 24/7 (A) for safety initially up D/C, pt and sister report pt will have adequate (A). Pt ready for D/C from mobility standpoint.   Follow Up Recommendations  Home health PT;Supervision for mobility/OOB     Equipment Recommendations  Rolling walker with 5" wheels    Recommendations for Other Services OT consult     Precautions / Restrictions Precautions Precautions: Fall;Back Precaution Comments: pt able to recall 3/3 back precautions  Required Braces or Orthoses: Spinal Brace Spinal Brace: Lumbar corset;Applied in sitting position Restrictions Weight Bearing Restrictions: No    Mobility  Bed Mobility Overal bed mobility: Needs Assistance Bed Mobility: Rolling;Sidelying to Sit Rolling: Modified independent (Device/Increase time) Sidelying to sit: Min assist       General bed mobility comments: pt with difficulty pushing up from sidelying position into sitting position; required handheld (A) to achieve; min cues for log rolling technique  Transfers Overall transfer level: Needs assistance Equipment used: Rolling walker (2 wheeled) Transfers: Sit to/from Stand Sit to Stand: Supervision         General transfer comment: supervision for safety and cues for hand placement; transferred from bed and BSC   Ambulation/Gait Ambulation/Gait assistance: Supervision Ambulation  Distance (Feet): 150 Feet Assistive device: Rolling walker (2 wheeled) Gait Pattern/deviations: Step-through pattern;Decreased stride length;Trunk flexed Gait velocity: decreased due to pain/fatigue  Gait velocity interpretation: Below normal speed for age/gender General Gait Details: cues for safety with RW; pt limited in distance due to fatigue; pt reported she felt better with ambulating; knees slightly flexed with ambulation; moves well with RW    Stairs            Wheelchair Mobility    Modified Rankin (Stroke Patients Only)       Balance Overall balance assessment:  (supervision ) Sitting-balance support: Feet supported;No upper extremity supported Sitting balance-Leahy Scale: Good Sitting balance - Comments: pt able to maintain balance well today at EOB   Standing balance support: During functional activity;Bilateral upper extremity supported;No upper extremity supported Standing balance-Leahy Scale: Fair Standing balance comment: pt able to stand at sink for ADLs with and without UE support; no LOB noted; cues for back precautions when performing                    Cognition Arousal/Alertness: Lethargic (iniitally lethargic) Behavior During Therapy: WFL for tasks assessed/performed Overall Cognitive Status: Within Functional Limits for tasks assessed                      Exercises      General Comments General comments (skin integrity, edema, etc.): educated on proper technique for car transfers to pt and sister; both verbalized understanding; educated to have all throw rugs put away upon return home       Pertinent Vitals/Pain Reports "no pain" at this ttime     Home Living  Prior Function            PT Goals (current goals can now be found in the care plan section) Acute Rehab PT Goals Patient Stated Goal: to get better and go home PT Goal Formulation: With patient Time For Goal Achievement:  07/22/13 Potential to Achieve Goals: Good Progress towards PT goals: Progressing toward goals    Frequency  Min 5X/week    PT Plan Current plan remains appropriate    Co-evaluation             End of Session Equipment Utilized During Treatment: Gait belt;Back brace Activity Tolerance: Patient limited by fatigue Patient left: in chair;with call bell/phone within reach;with family/visitor present     Time: 5686-1683 PT Time Calculation (min): 30 min  Charges:  $Gait Training: 8-22 mins $Therapeutic Activity: 8-22 mins                    G CodesKennis Carina [place], [place]  [phone] 07/17/2013, 3:00 PM

## 2013-07-17 NOTE — Progress Notes (Signed)
No major changes in status. Back pain remains improved but patient still complaining of right lateral hip pain center over her right greater trochanter. The pain is not radicular. It is not associated the numbness paresthesias or weakness.  Bone scan yesterday essentially negative.  Afebrile. Vital stable. Pathology will be pending for 3-5 days secondary to Decalcification of the sample. Patient is stable for discharge from my standpoint as long as her pain can be controlled. Will followup once pathology becomes comes available.

## 2013-07-17 NOTE — Discharge Summary (Signed)
Autumn Duran, is a 63 y.o. female  DOB [birth year]  MRN [id number].  Admission date:  07/12/2013  Admitting Physician  Fuller Plan, MD  Discharge Date:  07/17/2013   Primary MD  [name], MD  Recommendations for primary care physician for things to follow:   Kindly  follow biopsy from the L-spine surgical site which is pending. Patient must follow with neurosurgery and oncology outpatient for continued workup for pathological fracture. No M spike on SPEP or UPEP.    Admission Diagnosis  Lumbar compression fracture  L/3 fracture   Discharge Diagnosis  Lumbar compression fracture  L/3 fracture     Active Problems:   Lumbar compression fracture   Compression fracture      Past Medical History  Diagnosis Date  . Diverticulosis   . Hemorrhoids   . Hx of adenomatous colonic polyps   . Hiatal hernia   . Hypertension   . Hyperlipidemia   . GERD (gastroesophageal reflux disease)   . Chronic kidney disease     kidney stone s/p stent placement ( removed)  . Complication of anesthesia   . PONV (postoperative nausea and vomiting)   . Bronchitis, chronic     Past Surgical History  Procedure Laterality Date  . Cholecystectomy    . Esophagogastroduodenoscopy  12/18/2011    Procedure: ESOPHAGOGASTRODUODENOSCOPY (EGD);  Surgeon: Lafayette Dragon, MD;  Location: [facility];  Service: Endoscopy;  Laterality: N/A;  . Colonoscopy  12/18/2011    Procedure: COLONOSCOPY;  Surgeon: Lafayette Dragon, MD;  Location: [facility];  Service: Endoscopy;  Laterality: N/A;  . Ureteral stent placement    . Vertebroplasty N/A 07/14/2013    Procedure: VERTEBROPLASTY WITH LUMBAR THREE BIOPSY;  Surgeon: Charlie Pitter, MD;  Location: [facility];  Service: Neurosurgery;  Laterality: N/A;  VERTEBROPLASTY WITH LUMBAR THREE BIOPSY     Discharge  Condition: stable   Follow UP  Follow-up Information   Follow up with [name],[name], MD. Schedule an appointment as soon as possible for a visit in 1 week.   Specialty:  Internal Medicine   Contact information:   [place] [address] [phone]       Follow up with CHISM, DAVID, MD. Schedule an appointment as soon as possible for a visit in 1 week.   Specialty:  Internal Medicine   Contact information:   [place] [address] [phone]       Follow up with POOL,HENRY A, MD. Schedule an appointment as soon as possible for a visit in 1 week.   Specialty:  Neurosurgery   Contact information:   [address] [place] [address] [phone]         Discharge Instructions  and  Discharge Medications      Discharge Instructions   Diet - low sodium heart healthy    Complete by:  As directed      Discharge instructions    Complete by:  As directed  Follow with Primary MD [name],[name], MD in 7 days   Get CBC, CMP, checked 7 days by Primary MD and again as instructed by your Primary MD.     Activity: As tolerated with Full fall precautions use walker/cane & assistance as needed   Disposition Home with [facility]   Diet: Heart Healthy    For Heart failure patients - Check your Weight same time everyday, if you gain over 2 pounds, or you develop in leg swelling, experience more shortness of breath or chest pain, call your Primary MD immediately. Follow Cardiac Low Salt Diet and 1.8 lit/day fluid restriction.   On your next visit with her primary care physician please Get Medicines reviewed and adjusted.  Please request your Prim.MD to go over all Hospital Tests and Procedure/Radiological results at the follow up, please get all Hospital records sent to your Prim MD by signing hospital release before you go home.   If you experience worsening of your admission symptoms, develop shortness of breath, life threatening emergency,  suicidal or homicidal thoughts you must seek medical attention immediately by calling 911 or calling your MD immediately  if symptoms less severe.  You Must read complete instructions/literature along with all the possible adverse reactions/side effects for all the Medicines you take and that have been prescribed to you. Take any new Medicines after you have completely understood and accpet all the possible adverse reactions/side effects.   Do not drive and provide baby sitting services if your were admitted for syncope or siezures until you have seen by Primary MD or a Neurologist and advised to do so again.  Do not drive when taking Pain medications.    Do not take more than prescribed Pain, Sleep and Anxiety Medications  Special Instructions: If you have smoked or chewed Tobacco  in the last 2 yrs please stop smoking, stop any regular Alcohol  and or any Recreational drug use.  Wear Seat belts while driving.   Please note  You were cared for by a hospitalist during your hospital stay. If you have any questions about your discharge medications or the care you received while you were in the hospital after you are discharged, you can call the unit and asked to speak with the hospitalist on call if the hospitalist that took care of you is not available. Once you are discharged, your primary care physician will handle any further medical issues. Please note that NO REFILLS for any discharge medications will be authorized once you are discharged, as it is imperative that you return to your primary care physician (or establish a relationship with a primary care physician if you do not have one) for your aftercare needs so that they can reassess your need for medications and monitor your lab values.  Follow with Primary MD [name],[name], MD in 7 days   Get CBC, CMP, checked 7 days by Primary MD and again as instructed by your Primary MD.     Activity: As tolerated with Full fall precautions use  walker/cane & assistance as needed   Disposition Home with [facility]   Diet: Heart Healthy    For Heart failure patients - Check your Weight same time everyday, if you gain over 2 pounds, or you develop in leg swelling, experience more shortness of breath or chest pain, call your Primary MD immediately. Follow Cardiac Low Salt Diet and 1.8 lit/day fluid restriction.   On your next visit with her primary care physician please Get Medicines reviewed and  adjusted.  Please request your Prim.MD to go over all Hospital Tests and Procedure/Radiological results at the follow up, please get all Hospital records sent to your Prim MD by signing hospital release before you go home.   If you experience worsening of your admission symptoms, develop shortness of breath, life threatening emergency, suicidal or homicidal thoughts you must seek medical attention immediately by calling 911 or calling your MD immediately  if symptoms less severe.  You Must read complete instructions/literature along with all the possible adverse reactions/side effects for all the Medicines you take and that have been prescribed to you. Take any new Medicines after you have completely understood and accpet all the possible adverse reactions/side effects.   Do not drive and provide baby sitting services if your were admitted for syncope or siezures until you have seen by Primary MD or a Neurologist and advised to do so again.  Do not drive when taking Pain medications.    Do not take more than prescribed Pain, Sleep and Anxiety Medications  Special Instructions: If you have smoked or chewed Tobacco  in the last 2 yrs please stop smoking, stop any regular Alcohol  and or any Recreational drug use.  Wear Seat belts while driving.   Please note  You were cared for by a hospitalist during your hospital stay. If you have any questions about your discharge medications or the care you received while you were in the hospital after  you are discharged, you can call the unit and asked to speak with the hospitalist on call if the hospitalist that took care of you is not available. Once you are discharged, your primary care physician will handle any further medical issues. Please note that NO REFILLS for any discharge medications will be authorized once you are discharged, as it is imperative that you return to your primary care physician (or establish a relationship with a primary care physician if you do not have one) for your aftercare needs so that they can reassess your need for medications and monitor your lab values.     Increase activity slowly    Complete by:  As directed             Medication List    STOP taking these medications       ibuprofen 200 MG tablet  Commonly known as:  ADVIL,MOTRIN      TAKE these medications       acetaminophen 500 MG tablet  Commonly known as:  TYLENOL  Take 1,000 mg by mouth every 6 (six) hours as needed for moderate pain.     cyclobenzaprine 5 MG tablet  Commonly known as:  FLEXERIL  Take 1 tablet (5 mg total) by mouth 3 (three) times daily as needed for muscle spasms.     diazepam 5 MG tablet  Commonly known as:  VALIUM  Take 5 mg by mouth every 8 (eight) hours as needed for anxiety.     HYDROcodone-acetaminophen 5-325 MG per tablet  Commonly known as:  NORCO/VICODIN  Take 1-2 tablets by mouth every 4 (four) hours as needed for moderate pain.     olmesartan 20 MG tablet  Commonly known as:  BENICAR  Take 20 mg by mouth daily.     ondansetron 4 MG tablet  Commonly known as:  ZOFRAN  Take 1 tablet (4 mg total) by mouth every 6 (six) hours as needed for nausea.     polyethylene glycol packet  Commonly known as:  MIRALAX /  GLYCOLAX  Take 17 g by mouth 2 (two) times daily.          Diet and Activity recommendation: See Discharge Instructions above   Consults obtained - N surg   Major procedures and Radiology Reports - PLEASE review detailed and final  reports for all details, in brief -       Dg Lumbar Spine 2-3 Views  07/14/2013   CLINICAL DATA:  63 year old female undergoing treatment for lumbar fracture. Initial encounter.  EXAM: LUMBAR SPINE - 2-3 VIEW; DG C-ARM 61-120 MIN  COMPARISON:  Outside lumbar MRI 07/04/2013 available on [facility] P[facility]  FINDINGS: Intraoperative AP and lateral fluoroscopic views of the lumbar spine. Bone cement projects at what appears to be the L3 level (on the basis of lowest ribs at T12). Cement is mostly unilateral, tracking centrally near the central vertebral body.  IMPRESSION: L3 kyphoplasty or vertebroplasty depicted.   Electronically Signed   By: Lars Pinks M.D.   On: 07/14/2013 21:36   Ct Chest W Contrast  07/12/2013   CLINICAL DATA:  Evaluate for tumor.  EXAM: CT CHEST, ABDOMEN, AND PELVIS WITH CONTRAST  TECHNIQUE: Multidetector CT imaging of the chest, abdomen and pelvis was performed following the standard protocol during bolus administration of intravenous contrast.  CONTRAST:  120 cc of Omni 300  COMPARISON:  07/31/2011  FINDINGS: CT CHEST FINDINGS  The heart size is normal. There is no pericardial effusion. There are no enlarged mediastinal or hilar lymph nodes. Large hiatal hernia is identified. Dilated, fluid-filled esophagus is noted. No enlarged axillary or supraclavicular lymph nodes.  No pleural effusions identified. No airspace consolidation or atelectasis. Review of the visualized bony structures is significant for mild thoracic spondylosis. Mild T7 wedge compression deformity is identified. This is unchanged from previous exam.  CT ABDOMEN AND PELVIS FINDINGS  Cyst is identified within the lateral segment of left hepatic lobe. There is no additional liver abnormality. Prior cholecystitis me. No biliary dilatation. Normal appearance of the pancreas. The spleen is normal. The adrenal glands are both normal. Normal appearance stress set tiny hypodensity within the upper pole the right kidney is too small to  characterize. There is a small stone within the upper pole of the right kidney measuring 3 mm, image 54/series 2. Stone within the upper pole of the left kidney measures 3 mm, image 60/series 2. Renal sinus cysts identified within the upper pole of the left kidney. The urinary bladder appears normal. The uterus and adnexal structures are unremarkable.  Normal caliber of the abdominal aorta. No aneurysm. There is no upper abdominal adenopathy identified. No pelvic or inguinal adenopathy identified.  Fracture deformity involving the right side of the inferior endplate of the L3 vertebra is identified. There is asymmetric soft tissue expansion surrounding the L3 for disc space. This has mass effect upon the right neural foramina and effaces the right side of the ventral thecal sac. There is asymmetric enlargement of the right psoas muscle. No aggressive lytic or sclerotic bone lesions are identified involving the remaining spine or bony pelvis.  IMPRESSION: 1. No primary malignancy identified within the chest, abdomen or pelvis. 2. Fracture involving the inferior endplate of the L3 vertebra is again noted. There is associated soft tissue expansion around the L3-4 disc space with the effacement of the right L3-4 neural foramina and ventral thecal sac. There is also asymmetric expansion of the right psoas muscle. These findings are nonspecific and although this may reflect underlying tumor, infection and hematoma secondary to  fracture may also have a similar appearance. Tissue sampling may be necessary to confirm this diagnosis.   Electronically Signed   By: Kerby Moors M.D.   On: 07/12/2013 20:50   Nm Bone Scan Whole Body  07/16/2013   CLINICAL DATA:  Back pain for 1 month. History of vertebroplasty 07/14/2013.  EXAM: NUCLEAR MEDICINE WHOLE BODY BONE SCAN  TECHNIQUE: Whole body anterior and posterior images were obtained approximately 3 hours after intravenous injection of radiopharmaceutical.   RADIOPHARMACEUTICALS:  25 mCi of Technetium-99 MDP  COMPARISON:  CT chest, abdomen and pelvis 07/12/2013. Image from vertebral augmentation 07/14/2013.  FINDINGS: Mildly increased uptake is seen in L3. Osseous uptake is otherwise unremarkable. Soft tissue uptake appears normal.  IMPRESSION: Mildly increased uptake in L3 is compatible with recent vertebral augmentation. The exam is otherwise negative.   Electronically Signed   By: Inge Rise M.D.   On: 07/16/2013 17:36   Ct Abdomen Pelvis W Contrast  07/12/2013   CLINICAL DATA:  Evaluate for tumor.  EXAM: CT CHEST, ABDOMEN, AND PELVIS WITH CONTRAST  TECHNIQUE: Multidetector CT imaging of the chest, abdomen and pelvis was performed following the standard protocol during bolus administration of intravenous contrast.  CONTRAST:  120 cc of Omni 300  COMPARISON:  07/31/2011  FINDINGS: CT CHEST FINDINGS  The heart size is normal. There is no pericardial effusion. There are no enlarged mediastinal or hilar lymph nodes. Large hiatal hernia is identified. Dilated, fluid-filled esophagus is noted. No enlarged axillary or supraclavicular lymph nodes.  No pleural effusions identified. No airspace consolidation or atelectasis. Review of the visualized bony structures is significant for mild thoracic spondylosis. Mild T7 wedge compression deformity is identified. This is unchanged from previous exam.  CT ABDOMEN AND PELVIS FINDINGS  Cyst is identified within the lateral segment of left hepatic lobe. There is no additional liver abnormality. Prior cholecystitis me. No biliary dilatation. Normal appearance of the pancreas. The spleen is normal. The adrenal glands are both normal. Normal appearance stress set tiny hypodensity within the upper pole the right kidney is too small to characterize. There is a small stone within the upper pole of the right kidney measuring 3 mm, image 54/series 2. Stone within the upper pole of the left kidney measures 3 mm, image 60/series 2.  Renal sinus cysts identified within the upper pole of the left kidney. The urinary bladder appears normal. The uterus and adnexal structures are unremarkable.  Normal caliber of the abdominal aorta. No aneurysm. There is no upper abdominal adenopathy identified. No pelvic or inguinal adenopathy identified.  Fracture deformity involving the right side of the inferior endplate of the L3 vertebra is identified. There is asymmetric soft tissue expansion surrounding the L3 for disc space. This has mass effect upon the right neural foramina and effaces the right side of the ventral thecal sac. There is asymmetric enlargement of the right psoas muscle. No aggressive lytic or sclerotic bone lesions are identified involving the remaining spine or bony pelvis.  IMPRESSION: 1. No primary malignancy identified within the chest, abdomen or pelvis. 2. Fracture involving the inferior endplate of the L3 vertebra is again noted. There is associated soft tissue expansion around the L3-4 disc space with the effacement of the right L3-4 neural foramina and ventral thecal sac. There is also asymmetric expansion of the right psoas muscle. These findings are nonspecific and although this may reflect underlying tumor, infection and hematoma secondary to fracture may also have a similar appearance. Tissue sampling may be necessary  to confirm this diagnosis.   Electronically Signed   By: Kerby Moors M.D.   On: 07/12/2013 20:50   Dg C-arm 1-60 Min  07/14/2013   CLINICAL DATA:  63 year old female undergoing treatment for lumbar fracture. Initial encounter.  EXAM: LUMBAR SPINE - 2-3 VIEW; DG C-ARM 61-120 MIN  COMPARISON:  Outside lumbar MRI 07/04/2013 available on [facility]  FINDINGS: Intraoperative AP and lateral fluoroscopic views of the lumbar spine. Bone cement projects at what appears to be the L3 level (on the basis of lowest ribs at T12). Cement is mostly unilateral, tracking centrally near the central vertebral body.   IMPRESSION: L3 kyphoplasty or vertebroplasty depicted.   Electronically Signed   By: Lars Pinks M.D.   On: 07/14/2013 21:36    Micro Results      Recent Results (from the past 240 hour(s))  SURGICAL PCR SCREEN     Status: Abnormal   Collection Time    07/13/13  5:45 AM      Result Value Ref Range Status   MRSA, PCR POSITIVE (*) NEGATIVE Final   Comment: RESULT CALLED TO, READ BACK BY AND VERIFIED WITH:     J.PINEDA,RN [id number] 07/13/13 M.CAMPBELL   Staphylococcus aureus POSITIVE (*) NEGATIVE Final   Comment:            The Xpert SA Assay (FDA     approved for NASAL specimens     in patients over 63 years of age),     is one component of     a comprehensive surveillance     program.  Test performance has     been validated by [employer] for patients greater     than or equal to 51 year old.     It is not intended     to diagnose infection nor to     guide or monitor treatment.     History of present illness and  Hospital Course:     Kindly see H&P for history of present illness and admission details, please review complete Labs, Consult reports and Test reports for all details in brief Autumn Duran, is a 63 y.o. female, patient with history of  hypertension, GERD, hemorrhoids, hiatal hernia, dyslipidemia was admitted to the hospital with chief complaints of low back pain which was found to be secondary to a pathological L-spine fracture is still unclear she needs continued outpatient workup for pathological fracture.   Lumbar compression fracture  - Suspected pathological fracture of third lumbar vertebra-etiology not known at this time. Whole-body bone scan unremarkable except for the L-spine site of fracture, SPEP UPEP showed no M spike, discussed with Dr. Juliann Mule oncologist over the phone who will follow the patient in the office for continued pathological fracture workup, CT of the chest and abdomen negative for obvious malignancy except for soft tissue expansion around the  health day-L4 disc space-not known whether this is a hemangioma, possible tumor or infection.    Seen by neurosurgery, underwent bone biopsy and vertebroplasty on 07/14/13. Pain is much better, pathology from the site is pending, will be discharged with home PT and a walker along with pain and nausea medications. Will follow with neurosurgery, PCP and oncology in the outpatient setting for biopsy results and for pathological fracture workup.     Hypertension  - resume home medication   GERD  - Continue PPI   Constipation  - Resolved, we'll give her MiraLAX as needed    Today  Subjective:   Autumn Duran today has no headache,no chest abdominal pain,no new weakness tingling or numbness, feels much better wants to go home today. improved back pain.  Objective:   Blood pressure 105/45, pulse 60, temperature 98.1 F (36.7 C), temperature source Oral, resp. rate 20, height 5\' 1"  (1.549 m), weight 85.684 kg (188 lb 14.4 oz), SpO2 91.00%.  No intake or output data in the 24 hours ending 07/17/13 1014  Exam Awake Alert, Oriented *3, No new F.N deficits, Normal affect [place].AT,PERRAL Supple Neck,No JVD, No cervical lymphadenopathy appriciated.  Symmetrical Chest wall movement, Good air movement bilaterally, CTAB RRR,No Gallops,Rubs or new Murmurs, No Parasternal Heave +ve B.Sounds, Abd Soft, Non tender, No organomegaly appriciated, No rebound -guarding or rigidity. No Cyanosis, Clubbing or edema, No new Rash or bruise  Data Review   CBC w Diff:  Lab Results  Component Value Date   WBC 6.6 07/13/2013   HGB 10.9* 07/13/2013   HCT 35.4* 07/13/2013   PLT 323 07/13/2013   LYMPHOPCT 20 07/12/2013   MONOPCT 8 07/12/2013   EOSPCT 1 07/12/2013   BASOPCT 0 07/12/2013    CMP:  Lab Results  Component Value Date   NA 140 07/13/2013   K 3.8 07/13/2013   CL 103 07/13/2013   CO2 24 07/13/2013   BUN 14 07/13/2013   CREATININE 0.63 07/13/2013   PROT 7.3 07/13/2013   ALBUMIN 3.2* 07/13/2013    BILITOT 0.3 07/13/2013   ALKPHOS 112 07/13/2013   AST 29 07/13/2013   ALT 25 07/13/2013  .   Total Time in preparing paper work, data evaluation and todays exam - 35 minutes  Thurnell Lose M.D on 07/17/2013 at 10:14 AM  [facility] Office  [phone]   **Disclaimer: This note may have been dictated with voice recognition software. Similar sounding words can inadvertently be transcribed and this note may contain transcription errors which may not have been corrected upon publication of note.**

## 2013-07-17 NOTE — Plan of Care (Signed)
Problem: Acute Rehab PT Goals(only PT should resolve) Goal: Pt Will Go Up/Down Stairs Outcome: Not Applicable Date Met:  50/53/97 Pt denies any stairs for home entry; bedroom moved to first floor.

## 2013-07-17 NOTE — Progress Notes (Signed)
Patient complaining of 10/10 pain in her right hip. She is tearful and finding it difficult to get comfortable. Pain medicines given and heat pack applied. Will continue to monitor. [name] Segall

## 2013-07-17 NOTE — Discharge Instructions (Signed)
Follow with Primary MD [name],[name], MD in 7 days   Get CBC, CMP, checked 7 days by Primary MD and again as instructed by your Primary MD.     Activity: As tolerated with Full fall precautions use walker/cane & assistance as needed   Disposition Home with [facility]   Diet: Heart Healthy    For Heart failure patients - Check your Weight same time everyday, if you gain over 2 pounds, or you develop in leg swelling, experience more shortness of breath or chest pain, call your Primary MD immediately. Follow Cardiac Low Salt Diet and 1.8 lit/day fluid restriction.   On your next visit with her primary care physician please Get Medicines reviewed and adjusted.  Please request your Prim.MD to go over all Hospital Tests and Procedure/Radiological results at the follow up, please get all Hospital records sent to your Prim MD by signing hospital release before you go home.   If you experience worsening of your admission symptoms, develop shortness of breath, life threatening emergency, suicidal or homicidal thoughts you must seek medical attention immediately by calling 911 or calling your MD immediately  if symptoms less severe.  You Must read complete instructions/literature along with all the possible adverse reactions/side effects for all the Medicines you take and that have been prescribed to you. Take any new Medicines after you have completely understood and accpet all the possible adverse reactions/side effects.   Do not drive and provide baby sitting services if your were admitted for syncope or siezures until you have seen by Primary MD or a Neurologist and advised to do so again.  Do not drive when taking Pain medications.    Do not take more than prescribed Pain, Sleep and Anxiety Medications  Special Instructions: If you have smoked or chewed Tobacco  in the last 2 yrs please stop smoking, stop any regular Alcohol  and or any Recreational drug use.  Wear Seat belts while  driving.   Please note  You were cared for by a hospitalist during your hospital stay. If you have any questions about your discharge medications or the care you received while you were in the hospital after you are discharged, you can call the unit and asked to speak with the hospitalist on call if the hospitalist that took care of you is not available. Once you are discharged, your primary care physician will handle any further medical issues. Please note that NO REFILLS for any discharge medications will be authorized once you are discharged, as it is imperative that you return to your primary care physician (or establish a relationship with a primary care physician if you do not have one) for your aftercare needs so that they can reassess your need for medications and monitor your lab values.

## 2013-07-17 NOTE — Progress Notes (Signed)
Pt D/c for home Health PT OT [facility] . Discharge instruction.follow up and prescription givem to pt and sister . Both verbalized good understanding incision care given condition at d/ is stable.[employer] rolling walker delivered and taken with pt

## 2013-08-19 ENCOUNTER — Ambulatory Visit (HOSPITAL_COMMUNITY)
Admission: RE | Admit: 2013-08-19 | Discharge: 2013-08-19 | Disposition: A | Discharge status: Home / Self Care | Payer: BC Managed Care – PPO | Source: Ambulatory Visit | Attending: Hematology and Oncology | Admitting: Hematology and Oncology

## 2013-08-19 ENCOUNTER — Encounter (HOSPITAL_COMMUNITY): Payer: BC Managed Care – PPO | Attending: Hematology and Oncology

## 2013-08-19 ENCOUNTER — Encounter (HOSPITAL_COMMUNITY): Payer: BC Managed Care – PPO

## 2013-08-19 ENCOUNTER — Encounter (HOSPITAL_COMMUNITY): Payer: Self-pay

## 2013-08-19 VITALS — BP 114/74 | HR 102 | Temp 98.0°F | Resp 18 | Ht 61.25 in | Wt 173.6 lb

## 2013-08-19 DIAGNOSIS — D472 Monoclonal gammopathy: Secondary | ICD-10-CM | POA: Insufficient documentation

## 2013-08-19 DIAGNOSIS — K449 Diaphragmatic hernia without obstruction or gangrene: Secondary | ICD-10-CM | POA: Insufficient documentation

## 2013-08-19 DIAGNOSIS — J9819 Other pulmonary collapse: Secondary | ICD-10-CM | POA: Insufficient documentation

## 2013-08-19 DIAGNOSIS — X58XXXA Exposure to other specified factors, initial encounter: Secondary | ICD-10-CM | POA: Insufficient documentation

## 2013-08-19 DIAGNOSIS — D72822 Plasmacytosis: Secondary | ICD-10-CM

## 2013-08-19 DIAGNOSIS — M545 Low back pain, unspecified: Secondary | ICD-10-CM

## 2013-08-19 DIAGNOSIS — Y929 Unspecified place or not applicable: Secondary | ICD-10-CM | POA: Insufficient documentation

## 2013-08-19 DIAGNOSIS — I10 Essential (primary) hypertension: Secondary | ICD-10-CM

## 2013-08-19 DIAGNOSIS — S22009A Unspecified fracture of unspecified thoracic vertebra, initial encounter for closed fracture: Secondary | ICD-10-CM | POA: Insufficient documentation

## 2013-08-19 LAB — COMPREHENSIVE METABOLIC PANEL
ALT: 6 U/L (ref 0–35)
AST: 16 U/L (ref 0–37)
Albumin: 3.2 g/dL — ABNORMAL LOW (ref 3.5–5.2)
Alkaline Phosphatase: 86 U/L (ref 39–117)
BUN: 8 mg/dL (ref 6–23)
CO2: 26 mEq/L (ref 19–32)
Calcium: 11.2 mg/dL — ABNORMAL HIGH (ref 8.4–10.5)
Chloride: 99 mEq/L (ref 96–112)
Creatinine, Ser: 0.71 mg/dL (ref 0.50–1.10)
GFR calc Af Amer: >90 mL/min (ref >90)
GFR calc non Af Amer: >90 mL/min (ref >90)
Glucose, Bld: 129 mg/dL — ABNORMAL HIGH (ref 70–99)
Potassium: 3.2 mEq/L — ABNORMAL LOW (ref 3.7–5.3)
Sodium: 139 mEq/L (ref 137–147)
Total Bilirubin: 0.3 mg/dL (ref 0.3–1.2)
Total Protein: 7.9 g/dL (ref 6.0–8.3)

## 2013-08-19 LAB — CBC WITH DIFFERENTIAL/PLATELET
Basophils Absolute: 0 10*3/uL (ref 0.0–0.1)
Basophils Relative: 0 % (ref 0–1)
Eosinophils Absolute: 0.1 10*3/uL (ref 0.0–0.7)
Eosinophils Relative: 1 % (ref 0–5)
HCT: 38.7 % (ref 36.0–46.0)
Hemoglobin: 12.5 g/dL (ref 12.0–15.0)
Lymphocytes Relative: 35 % (ref 12–46)
Lymphs Abs: 3 10*3/uL (ref 0.7–4.0)
MCH: 26.1 pg (ref 26.0–34.0)
MCHC: 32.3 g/dL (ref 30.0–36.0)
MCV: 80.8 fL (ref 78.0–100.0)
Monocytes Absolute: 0.5 10*3/uL (ref 0.1–1.0)
Monocytes Relative: 6 % (ref 3–12)
Neutro Abs: 5 10*3/uL (ref 1.7–7.7)
Neutrophils Relative %: 58 % (ref 43–77)
Platelets: 459 10*3/uL — ABNORMAL HIGH (ref 150–400)
RBC: 4.79 MIL/uL (ref 3.87–5.11)
RDW: 13.9 % (ref 11.5–15.5)
WBC: 8.5 10*3/uL (ref 4.0–10.5)

## 2013-08-19 LAB — RETICULOCYTES
RBC.: 4.79 MIL/uL (ref 3.87–5.11)
Retic Count, Absolute: 57.5 10*3/uL (ref 19.0–186.0)
Retic Ct Pct: 1.2 % (ref 0.4–3.1)

## 2013-08-19 MED ORDER — DEXAMETHASONE 4 MG PO TABS
ORAL_TABLET | ORAL | Status: DC
Start: 2013-08-19 — End: 2013-09-05

## 2013-08-19 MED ORDER — HYDROCODONE-ACETAMINOPHEN 10-325 MG PO TABS: ORAL_TABLET | ORAL | Status: DC

## 2013-08-19 NOTE — Progress Notes (Unsigned)
Levell July presented for labwork. Labs per MD order drawn via Peripheral Line 23 gauge needle inserted in right AC  Good blood return present. Procedure without incident.  Needle removed intact. Patient tolerated procedure well.

## 2013-08-19 NOTE — Patient Instructions (Signed)
[facility] Discharge Instructions  RECOMMENDATIONS MADE BY THE CONSULTANT AND ANY TEST RESULTS WILL BE SENT TO YOUR REFERRING PHYSICIAN.  EXAM FINDINGS BY THE PHYSICIAN TODAY AND SIGNS OR SYMPTOMS TO REPORT TO CLINIC OR PRIMARY PHYSICIAN: Exam and findings as discussed by Dr. Barnet Glasgow.  Will give you something a little stronger for pain control and add a steroid.  Will check some lab work today and will have you collect a 24 hour urine collection.  Want to see you on Thursday to see how your pain is and may switch you to a pain patch.  Want you to go by xray on your way out for bone survey.  MEDICATIONS PRESCRIBED:  Hydrocodone 10-325 -take 1 every 4 hours as needed for pain control and 2 at bedtime Decadron 4 mg - take 2 pills every morning Continue the miralax to make sure you don't get constipated with the increase in your pain medication.  INSTRUCTIONS/FOLLOW-UP: Will see you Thursday when you bring your urine collection in.  Thank you for choosing [facility] to provide your oncology and hematology care.  To afford each patient quality time with our providers, please arrive at least 15 minutes before your scheduled appointment time.  With your help, our goal is to use those 15 minutes to complete the necessary work-up to ensure our physicians have the information they need to help with your evaluation and healthcare recommendations.    Effective January 1st, 2014, we ask that you re-schedule your appointment with our physicians should you arrive 10 or more minutes late for your appointment.  We strive to give you quality time with our providers, and arriving late affects you and other patients whose appointments are after yours.    Again, thank you for choosing [facility].  Our hope is that these requests will decrease the amount of time that you wait before being seen by our physicians.         _____________________________________________________________  Should you have questions after your visit to [facility], please contact our office at [phone] between the hours of 8:30 a.m. and 4:30 p.m.  Voicemails left after 4:30 p.m. will not be returned until the following business day.  For prescription refill requests, have your pharmacy contact our office with your prescription refill request.    _______________________________________________________________  We hope that we have given you very good care.  You may receive a patient satisfaction survey in the mail, please complete it and return it as soon as possible.  We value your feedback!  24-Hour Urine Collection HOME CARE  When you get up in the morning on the day you do this test, pee (urinate) in the toilet and flush. Make a note of the time. This will be your start time on the day of collection and the end time on the next morning.  From then on, save all your pee (urine) in the plastic jug that was given to you.  You should stop collecting your pee 24 hours after you started.  If the plastic jug that is given to you already has liquid in it, that is okay. Do not throw out the liquid or rinse out the jug. Some tests need the liquid to be added to your pee.  Keep your plastic jug cool (in an ice chest or the refrigerator) during the test.  When the 24 hours is over, bring your plastic jug to the clinic lab. Keep the jug  cool (in an ice chest) while you are bringing it to the lab. Document Released: 05/12/2008 Document Revised: 05/08/2011 Document Reviewed: 05/12/2008 [employer] Patient Information 2015 [place], [place]. This information is not intended to replace advice given to you by your health care provider. Make sure you discuss any questions you have with your health care provider.

## 2013-08-19 NOTE — Progress Notes (Signed)
Autumn Duran, M.D.  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     HISTORY OF PRESENT ILLNESS:Autumn Duran is a 63 y.o. 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 She was suffering with right hip pain and had evaluation including MRI the hip that was unremarkable. She was referred to a neurosurgeon who performed an MRI of the spine revealing a L3 compression fracture. The patient underwent kyphoplasty on 07/14/2013 with relief of her pain but with worsening discomfort involving the lower posterior back with lower 70 paresthesias. She denies any difficulty in initiating urination and is taking MiraLAX on a regular basis to maintain bowel movements. She is only taking 1 hydrocodone 10/APAP 325 daily. She denies a fever, night sweats, cough, wheezing, sore throat, headache, incontinence, skin rash, or seizures.   PAST MEDICAL HISTORY:  has a past medical history of Diverticulosis; Hemorrhoids; adenomatous colonic polyps; Hiatal hernia; Hypertension; Hyperlipidemia; GERD (gastroesophageal reflux disease); Chronic kidney disease; Complication of anesthesia; PONV (postoperative nausea and vomiting); and Bronchitis, chronic.     PAST SURGICAL HISTORY: Past Surgical History  Procedure Laterality Date   Cholecystectomy     Esophagogastroduodenoscopy  12/18/2011    Procedure: ESOPHAGOGASTRODUODENOSCOPY (EGD);  Surgeon: Lafayette Dragon, MD;  Location: [facility];  Service: Endoscopy;  Laterality: N/A;   Colonoscopy  12/18/2011    Procedure: COLONOSCOPY;  Surgeon: Lafayette Dragon, MD;  Location: [facility];  Service: Endoscopy;   Laterality: N/A;   Ureteral stent placement     Vertebroplasty N/A 07/14/2013    Procedure: VERTEBROPLASTY WITH LUMBAR THREE BIOPSY;  Surgeon: Charlie Pitter, MD;  Location: [facility];  Service: Neurosurgery;  Laterality: N/A;  VERTEBROPLASTY WITH LUMBAR THREE BIOPSY     CURRENT MEDICATIONS: has a current medication list which includes the following prescription(s): acetaminophen, cyclobenzaprine, diazepam, hydrocodone-acetaminophen, ondansetron, polyethylene glycol, dexamethasone, hydrocodone-acetaminophen, and olmesartan.   ALLERGIES: Morphine and related and Tetracyclines & related   SOCIAL HISTORY:  reports that she has never smoked. She has never used smokeless tobacco. She reports that she drinks alcohol. She reports that she does not use illicit drugs.   FAMILY HISTORY: family history includes Breast cancer (age of onset: 27) in her sister; Breast cancer (age of onset: 47) in her other.    REVIEW OF SYSTEMS:  Other than that discussed above is noncontributory.    PHYSICAL EXAM:  height is 5' 1.25" (1.556 m) and weight is 173 lb 9.6 oz (78.744 kg). Her oral temperature is 98 F (36.7 C). Her blood pressure is 114/74 and her pulse is 102. Her respiration is 18.    GENERAL:alert, in distress from back pain and hip pain rated 7/10. SKIN: skin color, texture, turgor are normal, no rashes or significant lesions EYES: normal, Conjunctiva are pink and non-injected, sclera clear OROPHARYNX:no exudate, no erythema and lips, buccal mucosa, and tongue normal  NECK: supple, thyroid normal size, non-tender, without nodularity CHEST: Normal AP diameter with no breast masses. LYMPH:  no palpable lymphadenopathy in the cervical, axillary or inguinal LUNGS: clear to auscultation and percussion with normal breathing effort HEART: regular rate & rhythm and no murmurs ABDOMEN:abdomen soft, non-tender and normal bowel  sounds MUSCULOSKELETALl:no cyanosis of digits, no clubbing or edema .  Tenderness over the lumbar spine. NEURO: alert & oriented x 3 with fluent speech, no focal motor/sensory deficits. Left lower 70 clonus.    LABORATORY DATA:  No visits with results within 30 Day(s) from this visit. Latest known visit with results is:  Admission on 07/12/2013, Discharged on 07/17/2013  Component Date Value Ref Range Status   WBC 07/12/2013 8.8  4.0 - 10.5 K/uL Final   RBC 07/12/2013 4.36  3.87 - 5.11 MIL/uL Final   Hemoglobin 07/12/2013 11.9* 12.0 - 15.0 g/dL Final   HCT 07/12/2013 37.2  36.0 - 46.0 % Final   MCV 07/12/2013 85.3  78.0 - 100.0 fL Final   MCH 07/12/2013 27.3  26.0 - 34.0 pg Final   MCHC 07/12/2013 32.0  30.0 - 36.0 g/dL Final   RDW 07/12/2013 13.1  11.5 - 15.5 % Final   Platelets 07/12/2013 324  150 - 400 K/uL Final   Neutrophils Relative % 07/12/2013 71  43 - 77 % Final   Neutro Abs 07/12/2013 6.3  1.7 - 7.7 K/uL Final   Lymphocytes Relative 07/12/2013 20  12 - 46 % Final   Lymphs Abs 07/12/2013 1.8  0.7 - 4.0 K/uL Final   Monocytes Relative 07/12/2013 8  3 - 12 % Final   Monocytes Absolute 07/12/2013 0.7  0.1 - 1.0 K/uL Final   Eosinophils Relative 07/12/2013 1  0 - 5 % Final   Eosinophils Absolute 07/12/2013 0.1  0.0 - 0.7 K/uL Final   Basophils Relative 07/12/2013 0  0 - 1 % Final   Basophils Absolute 07/12/2013 0.0  0.0 - 0.1 K/uL Final   Sodium 07/12/2013 138  137 - 147 mEq/L Final   Potassium 07/12/2013 3.7  3.7 - 5.3 mEq/L Final   Chloride 07/12/2013 101  96 - 112 mEq/L Final   CO2 07/12/2013 26  19 - 32 mEq/L Final   Glucose, Bld 07/12/2013 102* 70 - 99 mg/dL Final   BUN 07/12/2013 18  6 - 23 mg/dL Final   Creatinine, Ser 07/12/2013 0.69  0.50 - 1.10 mg/dL Final   Calcium 07/12/2013 10.4  8.4 - 10.5 mg/dL Final   GFR calc non Af Amer 07/12/2013 >90  >90 mL/min Final   GFR calc Af Amer 07/12/2013 >90  >90 mL/min Final   Comment: (NOTE)                          The eGFR has been calculated using the CKD EPI  equation.                          This calculation has not been validated in all clinical situations.                          eGFR's persistently <90 mL/min signify possible Chronic Kidney                          Disease.   Sodium 07/13/2013 140  137 - 147 mEq/L Final   Potassium 07/13/2013 3.9  3.7 - 5.3 mEq/L Final   Chloride 07/13/2013 103  96 - 112 mEq/L Final   CO2 07/13/2013 25  19 - 32 mEq/L Final   Glucose, Bld 07/13/2013 89  70 - 99 mg/dL Final   BUN 07/13/2013 14  6 -  23 mg/dL Final   Creatinine, Ser 07/13/2013 0.64  0.50 - 1.10 mg/dL Final   Calcium 07/13/2013 9.9  8.4 - 10.5 mg/dL Final   GFR calc non Af Amer 07/13/2013 >90  >90 mL/min Final   GFR calc Af Amer 07/13/2013 >90  >90 mL/min Final   Comment: (NOTE)                          The eGFR has been calculated using the CKD EPI equation.                          This calculation has not been validated in all clinical situations.                          eGFR's persistently <90 mL/min signify possible Chronic Kidney                          Disease.   WBC 07/13/2013 6.6  4.0 - 10.5 K/uL Final   RBC 07/13/2013 4.09  3.87 - 5.11 MIL/uL Final   Hemoglobin 07/13/2013 10.9* 12.0 - 15.0 g/dL Final   HCT 07/13/2013 35.4* 36.0 - 46.0 % Final   MCV 07/13/2013 86.6  78.0 - 100.0 fL Final   MCH 07/13/2013 26.7  26.0 - 34.0 pg Final   MCHC 07/13/2013 30.8  30.0 - 36.0 g/dL Final   RDW 07/13/2013 13.2  11.5 - 15.5 % Final   Platelets 07/13/2013 323  150 - 400 K/uL Final   MRSA, PCR 07/13/2013 POSITIVE* NEGATIVE Final   Comment: RESULT CALLED TO, READ BACK BY AND VERIFIED WITH:                          J.PINEDA,RN [id number] 07/13/13 M.CAMPBELL   Staphylococcus aureus 07/13/2013 POSITIVE* NEGATIVE Final   Comment:                                 The Xpert SA Assay (FDA                          approved for NASAL specimens                          in patients over 65 years of age),                          is one  component of                          a comprehensive surveillance                          program.  Test performance has                          been validated by [employer] for patients greater  than or equal to 81 year old.                          It is not intended                          to diagnose infection nor to                          guide or monitor treatment.   Sodium 07/13/2013 140  137 - 147 mEq/L Final   Potassium 07/13/2013 3.8  3.7 - 5.3 mEq/L Final   Chloride 07/13/2013 103  96 - 112 mEq/L Final   CO2 07/13/2013 24  19 - 32 mEq/L Final   Glucose, Bld 07/13/2013 159* 70 - 99 mg/dL Final   BUN 07/13/2013 14  6 - 23 mg/dL Final   Creatinine, Ser 07/13/2013 0.63  0.50 - 1.10 mg/dL Final   Calcium 07/13/2013 10.3  8.4 - 10.5 mg/dL Final   Total Protein 07/13/2013 7.3  6.0 - 8.3 g/dL Final   Albumin 07/13/2013 3.2* 3.5 - 5.2 g/dL Final   AST 07/13/2013 29  0 - 37 U/L Final   ALT 07/13/2013 25  0 - 35 U/L Final   Alkaline Phosphatase 07/13/2013 112  39 - 117 U/L Final   Total Bilirubin 07/13/2013 0.3  0.3 - 1.2 mg/dL Final   GFR calc non Af Amer 07/13/2013 >90  >90 mL/min Final   GFR calc Af Amer 07/13/2013 >90  >90 mL/min Final   Comment: (NOTE)                          The eGFR has been calculated using the CKD EPI equation.                          This calculation has not been validated in all clinical situations.                          eGFR's persistently <90 mL/min signify possible Chronic Kidney                          Disease.   Total Protein ELP 07/13/2013 5.9* 6.0 - 8.3 g/dL Final   Albumin ELP 07/13/2013 47.5* 55.8 - 66.1 % Final   Alpha-1-Globulin 07/13/2013 9.0* 2.9 - 4.9 % Final   Alpha-2-Globulin 07/13/2013 13.0* 7.1 - 11.8 % Final   Beta Globulin 07/13/2013 7.4* 4.7 - 7.2 % Final   Beta 2 07/13/2013 7.0* 3.2 - 6.5 % Final   Gamma Globulin 07/13/2013 16.1  11.1 - 18.8 %  Final   M-Spike, % 07/13/2013 NOT DETECTED   Final   SPE Interp. 07/13/2013 (NOTE)   Final   Comment: Nonspecific pattern.                          Reviewed by Odis Hollingshead, MD, PhD, FCAP (Electronic Signature on                          File)   Comment 07/13/2013 (NOTE)   Final   Comment: ---------------  Serum protein electrophoresis is a useful screening procedure in the                          detection of various pathophysiologic states such as inflammation,                          gammopathies, protein loss and other dysproteinemias.  Immunofixation                          electrophoresis (IFE) is a more sensitive technique for the                          identification of M-proteins found in patients with monoclonal                          gammopathy of unknown significance (MGUS), amyloidosis, early or                          treated myeloma or macroglobulinemia, solitary plasmacytoma or                          extramedullary plasmacytoma.                          Performed at [facility]   Time 07/14/2013 RANDOM   Corrected   CORRECTED ON 05/20 AT 1052: PREVIOUSLY REPORTED AS URINE, RANDOM, CORRECTED ON 05/18 AT 0134: PREVIOUSLY REPORTED AS 24   Volume, Urine 07/14/2013 RANDOM   Corrected   CORRECTED ON 05/20 AT 1052: PREVIOUSLY REPORTED AS URINE, RANDOM   Total Protein, Urine 07/14/2013 16.3   Final   No established reference range.   Total Protein, Urine-Ur/day 07/14/2013 NOT CALC  10 - 140 mg/day Final   Comment: (NOTE)                          Total urinary protein is determined by adding the albumin and Kappa                          and/or Lambda light chains.  This value may not agree with the total                          protein as determined by chemical methods, which characteristically                          underestimate urinary light chains.   Albumin, U 07/14/2013 DETECTED  DETECTED Final   Alpha 1, Urine  07/14/2013 DETECTED* NONE DETECTED Final   Alpha 2, Urine 07/14/2013 DETECTED* NONE DETECTED Final   Beta, Urine 07/14/2013 DETECTED* NONE DETECTED Final   Gamma Globulin, Urine 07/14/2013 DETECTED* NONE DETECTED Final   Free Kappa Lt Chains,Ur 07/14/2013 13.80* 0.14 - 2.42 mg/dL Final   Free Lt Chn Excr Rate 07/14/2013 NOT CALC   Final   Free Lambda Lt Chains,Ur 07/14/2013 1.78* 0.02 - 0.67 mg/dL Final   Free Lambda Excretion/Day 07/14/2013 NOT CALC   Final   Free Kappa/Lambda Ratio 07/14/2013 7.75  2.04 - 10.37 ratio Final   (NOTE)  Immunofixation, Urine 07/14/2013 (NOTE)   Final   Comment: No monoclonal free light chains (Bence Jones Protein) are detected.                          Urine IFE shows polyclonal increase in free Kappa and/or free Lambda                          light chains.                          Reviewed by Odis Hollingshead, MD, PhD, FCAP (Electronic Signature on                          File)                          Performed at [facility]    Urinalysis No results found for this basename: colorurine,  appearanceur,  labspec,  phurine,  glucoseu,  hgbur,  bilirubinur,  ketonesur,  proteinur,  urobilinogen,  nitrite,  leukocytesur      _0 :   NM Bone Scan Whole Body Status: Final result         PACS Images    Show images for NM Bone Scan Whole Body         Study Result    CLINICAL DATA: Back pain for 1 month. History of vertebroplasty  07/14/2013.  EXAM:  NUCLEAR MEDICINE WHOLE BODY BONE SCAN  TECHNIQUE:  Whole body anterior and posterior images were obtained approximately  3 hours after intravenous injection of radiopharmaceutical.  RADIOPHARMACEUTICALS: 25 mCi of Technetium-99 MDP  COMPARISON: CT chest, abdomen and pelvis 07/12/2013. Image from  vertebral augmentation 07/14/2013.  FINDINGS:  Mildly increased uptake is seen in L3. Osseous uptake is otherwise  unremarkable. Soft tissue uptake appears normal.    IMPRESSION:  Mildly increased uptake in L3 is compatible with recent vertebral  augmentation. The exam is otherwise negative.  Electronically Signed  By: Inge Rise M.D.  On: 07/16/2013 17:36       CT Abdomen Pelvis W Contrast Status: Final result         PACS Images    Show images for CT Abdomen Pelvis W Contrast         Study Result    CLINICAL DATA: Evaluate for tumor.  EXAM:  CT CHEST, ABDOMEN, AND PELVIS WITH CONTRAST  TECHNIQUE:  Multidetector CT imaging of the chest, abdomen and pelvis was  performed following the standard protocol during bolus  administration of intravenous contrast.  CONTRAST: 120 cc of Omni 300  COMPARISON: 07/31/2011  FINDINGS:  CT CHEST FINDINGS  The heart size is normal. There is no pericardial effusion. There  are no enlarged mediastinal or hilar lymph nodes. Large hiatal  hernia is identified. Dilated, fluid-filled esophagus is noted. No  enlarged axillary or supraclavicular lymph nodes.  No pleural effusions identified. No airspace consolidation or  atelectasis. Review of the visualized bony structures is significant  for mild thoracic spondylosis. Mild T7 wedge compression deformity  is identified. This is unchanged from previous exam.  CT ABDOMEN AND PELVIS FINDINGS  Cyst is identified within the lateral segment of left hepatic lobe.  There is no additional liver abnormality. Prior cholecystitis me. No  biliary dilatation. Normal appearance of the pancreas. The spleen is  normal.  The adrenal glands are both normal. Normal appearance stress  set tiny hypodensity within the upper pole the right kidney is too  small to characterize. There is a small stone within the upper pole  of the right kidney measuring 3 mm, image 54/series 2. Stone within  the upper pole of the left kidney measures 3 mm, image 60/series 2.  Renal sinus cysts identified within the upper pole of the left  kidney. The urinary bladder appears normal. The  uterus and adnexal  structures are unremarkable.  Normal caliber of the abdominal aorta. No aneurysm. There is no  upper abdominal adenopathy identified. No pelvic or inguinal  adenopathy identified.  Fracture deformity involving the right side of the inferior endplate  of the L3 vertebra is identified. There is asymmetric soft tissue  expansion surrounding the L3 for disc space. This has mass effect  upon the right neural foramina and effaces the right side of the  ventral thecal sac. There is asymmetric enlargement of the right  psoas muscle. No aggressive lytic or sclerotic bone lesions are  identified involving the remaining spine or bony pelvis.  IMPRESSION:  1. No primary malignancy identified within the chest, abdomen or  pelvis.  2. Fracture involving the inferior endplate of the L3 vertebra is  again noted. There is associated soft tissue expansion around the  L3-4 disc space with the effacement of the right L3-4 neural  foramina and ventral thecal sac. There is also asymmetric expansion  of the right psoas muscle. These findings are nonspecific and  although this may reflect underlying tumor, infection and hematoma  secondary to fracture may also have a similar appearance. Tissue  sampling may be necessary to confirm this diagnosis.  Electronically Signed  By: Kerby Moors M.D.  On: 07/12/2013 20:50       PATHOLOGY:  FINAL for MARLEY, PAKULA ([id number]) Patient: ADELIZ, TONKINSON Collected: 07/14/2013 Client: [facility] Accession: [id number] Received: 07/15/2013 Earnie Larsson DOB: February 19, 1951 Age: 26 Gender: F Reported: 07/18/2013 [address] Patient Ph: [phone] MRN #: [id number] [place], [place] [address] Visit #: [id number] Chart #: Phone:  Fax: CC: Donato Heinz, MD REPORT OF SURGICAL PATHOLOGY FINAL DIAGNOSIS Diagnosis Bone, biopsy, lumbar three - LAMELLAR BONE WITH ASSOCIATED FIBROUS TISSUE AND INCREASED PLASMA CELLS. - THERE IS NO EVIDENCE  OF MALIGNANCY. - SEE COMMENT. Microscopic Comment Pending immunohistochemical stains. Diagnosis Note There are scattered plasma cells (approximately 5-10%), however, no aggregates are seen. Light chain immunohistochemistry is technically unsatisfactory. Pancytokeratin is negative. Correlation with clinical, radiographic, and laboratory data (SPEP, etc.) is recommended. Vicente Males MD Pathologist, Electronic Signature (Case signed 07/18/2013) Specimen Gross and Clinical Information Specimen(s) Obtained: Bone, biopsy, lumbar three Specimen Clinical Information Lumbar three fracture (tl) Gross Received fresh is a 1 cm in length and 0.3 cm in diameter core of tan-yellow to hyperemic firm bone, submitted in one block following decalcification. (SSW:ecj 07/15/2013) Stain(s) used in Diagnosis: The following stain(s) were used in diagnosing the case: LAMBDA, KAPPA, CD 138, CK AE1AE3. The control(s) stained appropriately. 1 of 2 FINAL for SHANEKQUA, SCHAPER ([id number]) Disclaimer Some of these immunohistochemical stains may have been developed and the performance characteristics determined by [facility]. Some may not have been cleared or approved by the U.S. Food and Drug Administration. The FDA has determined that such clearance or approval is not necessary. This test is used for clinical purposes. It should not be regarded as investigational or for research. This laboratory is certified under the Clinical Laboratory Improvement  Amendments of 1988 (CLIA-88) as qualified to perform high complexity clinical laboratory testing. Report signed out from the following location(s) Technical Component performed at [facility], [place], [place] [address]. CLIA #: [id number], Technical Component performed at [place], [place], [place] [address]. CLIA #: [id number], Interpretation performed at [facility], [place], [place] [address]. CLIA #: [id number],  IMPRESSION:  #1. 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 #2. Uncontrolled pain. #3. Hypertension, on treatment and controlled.   PLAN:  #1. Hydrocodone 10/APAP325 every 4 hours  during the day and 2 at bedtime. #2. Dexamethasone 8 mg by mouth each morning. #3. Continue MiraLAX along with milk of magnesia to maintain bowel movements. #4. Skeletal survey. #5. 24-hour urine immunofixation. Previous determination failed to reveal evidence of Bence-Jones proteinuria plus myeloma panel. #6. Determine analgesic requirement and substitute a fentanyl patch to control pain most of the time using hydrocodone for breakthrough pain. #7. Office visit on 09/04/2013 for discussion of additional workup. Patient would probably benefit from radiotherapy to the lumbar spine for palliation and may not need systemic treatment for myeloma although there still is a possibility until additional workup is completed.  I appreciate the opportunity sharing in her care.  Doroteo Bradford, MD 08/19/2013 4:01 PM   DISCLAIMER:  This note was dictated with voice recognition softwre.  Similar sounding words can inadvertently be transcribed inaccurately and may not be corrected upon review.

## 2013-08-20 ENCOUNTER — Telehealth (HOSPITAL_COMMUNITY): Payer: Self-pay

## 2013-08-20 ENCOUNTER — Other Ambulatory Visit (HOSPITAL_COMMUNITY): Payer: Self-pay | Admitting: Hematology and Oncology

## 2013-08-20 DIAGNOSIS — E876 Hypokalemia: Secondary | ICD-10-CM | POA: Insufficient documentation

## 2013-08-20 LAB — KAPPA/LAMBDA LIGHT CHAINS
Kappa free light chain: 2.87 mg/dL — ABNORMAL HIGH (ref 0.33–1.94)
Kappa, lambda light chain ratio: 0.84 (ref 0.26–1.65)
Lambda free light chains: 3.41 mg/dL — ABNORMAL HIGH (ref 0.57–2.63)

## 2013-08-20 MED ORDER — POTASSIUM CHLORIDE CRYS ER 20 MEQ PO TBCR: 20.0000 meq | EXTENDED_RELEASE_TABLET | Freq: Two times a day (BID) | ORAL | Status: DC

## 2013-08-20 NOTE — Telephone Encounter (Signed)
Message copied by Horton Chin on Wed Aug 20, 2013  4:02 PM ------      Message from: [place], [place]: Wed Aug 20, 2013  6:23 AM       Please inform Ms. Getchell that her potassium is low and I have ordered supplements from her pharmacy---K-dur 20 BID.  Thanks. Dr.F ------

## 2013-08-21 ENCOUNTER — Encounter (HOSPITAL_COMMUNITY): Payer: Self-pay

## 2013-08-21 ENCOUNTER — Encounter (HOSPITAL_BASED_OUTPATIENT_CLINIC_OR_DEPARTMENT_OTHER): Payer: BC Managed Care – PPO

## 2013-08-21 VITALS — BP 104/58 | HR 98 | Temp 98.0°F | Resp 18

## 2013-08-21 DIAGNOSIS — D72822 Plasmacytosis: Secondary | ICD-10-CM

## 2013-08-21 DIAGNOSIS — Z87311 Personal history of (healed) other pathological fracture: Secondary | ICD-10-CM

## 2013-08-21 LAB — MULTIPLE MYELOMA PANEL, SERUM
Albumin ELP: 49.8 % — ABNORMAL LOW (ref 55.8–66.1)
Alpha-1-Globulin: 6.9 % — ABNORMAL HIGH (ref 2.9–4.9)
Alpha-2-Globulin: 12.8 % — ABNORMAL HIGH (ref 7.1–11.8)
Beta 2: 5.7 % (ref 3.2–6.5)
Beta Globulin: 6.8 % (ref 4.7–7.2)
Gamma Globulin: 18 % (ref 11.1–18.8)
IgA: 282 mg/dL (ref 69–380)
IgG (Immunoglobin G), Serum: 1480 mg/dL (ref 690–1700)
IgM, Serum: 96 mg/dL (ref 52–322)
M-Spike, %: NOT DETECTED g/dL
Total Protein: 7.4 g/dL (ref 6.0–8.3)

## 2013-08-21 LAB — BETA 2 MICROGLOBULIN, SERUM: Beta-2 Microglobulin: 3.51 mg/L — ABNORMAL HIGH (ref <=2.51)

## 2013-08-21 LAB — IMMUNOFIXATION ELECTROPHORESIS
IgA: 282 mg/dL (ref 69–380)
IgG (Immunoglobin G), Serum: 1450 mg/dL (ref 690–1700)
IgM, Serum: 93 mg/dL (ref 52–322)
Total Protein ELP: 7.2 g/dL (ref 6.0–8.3)

## 2013-08-21 MED ORDER — FENTANYL 12 MCG/HR TD PT72: MEDICATED_PATCH | TRANSDERMAL | Status: DC

## 2013-08-21 NOTE — Patient Instructions (Signed)
[facility] Discharge Instructions  RECOMMENDATIONS MADE BY THE CONSULTANT AND ANY TEST RESULTS WILL BE SENT TO YOUR REFERRING PHYSICIAN.  Dexamethasone 8mg  daily for 3 days then decrease to 4mg  daily.  Please take in the morning. Fentanyl as prescribed. We have made a referral to [facility] for radiation. We will see you in 2 weeks.   Thank you for choosing [facility] to provide your oncology and hematology care.  To afford each patient quality time with our providers, please arrive at least 15 minutes before your scheduled appointment time.  With your help, our goal is to use those 15 minutes to complete the necessary work-up to ensure our physicians have the information they need to help with your evaluation and healthcare recommendations.    Effective January 1st, 2014, we ask that you re-schedule your appointment with our physicians should you arrive 10 or more minutes late for your appointment.  We strive to give you quality time with our providers, and arriving late affects you and other patients whose appointments are after yours.    Again, thank you for choosing [facility].  Our hope is that these requests will decrease the amount of time that you wait before being seen by our physicians.       _____________________________________________________________  Should you have questions after your visit to [facility], please contact our office at [phone] between the hours of 8:30 a.m. and 4:30 p.m.  Voicemails left after 4:30 p.m. will not be returned until the following business day.  For prescription refill requests, have your pharmacy contact our office with your prescription refill request.    _______________________________________________________________  We hope that we have given you very good care.  You may receive a patient satisfaction survey in the mail, please complete it and return it as soon as possible.  We  value your feedback!

## 2013-08-21 NOTE — Progress Notes (Signed)
[place]  OFFICE PROGRESS NOTE  [name], MD [place] [address]  DIAGNOSIS: Plasmacytosis - Plan: Protein electrophoresis, urine, Immunofixation, urine, Consult to radiation oncology  Hypercalcemia  Chief Complaint  Patient presents with   Plasmacytosis   L3 vertebroplasty   Hypercalcemia    CURRENT THERAPY: L3 vertebroplasty.  INTERVAL HISTORY: Autumn Duran 63 y.o. female returns for followup after undergoing additional testing after plasmacytosis less than 10% was found on bone specimen obtained in conjunction with L3 vertebroplasty. Pain is not a level of 3/10. She is taking hydrocodone 10 every 4 hours during the day and 20 mg at bedtime. She is also taking dexamethasone 8 mg daily. Previously noted back pain radiating to the pelvis as nearly  completely gone. Appetite has improved. The patient's supper the night last night. She was able to obtain additional disability from the neurosurgeon who operated upon her. Bowel movements are regular using cathartics nightly with no nausea, vomiting, cough, wheezing, fever, or night sweats. She also denies any nocturia.  MEDICAL HISTORY: Past Medical History  Diagnosis Date   Diverticulosis    Hemorrhoids    Hx of adenomatous colonic polyps    Hiatal hernia    Hypertension    Hyperlipidemia    GERD (gastroesophageal reflux disease)    Chronic kidney disease     kidney stone s/p stent placement ( removed)   Complication of anesthesia    PONV (postoperative nausea and vomiting)    Bronchitis, chronic     INTERIM HISTORY: has Benign neoplasm of colon; Esophageal reflux; Other dysphagia; Lumbar compression fracture; Compression fracture; Hypercalcemia; and Hypokalemia on her problem list.    ALLERGIES:  is allergic to morphine and related and tetracyclines & related.  MEDICATIONS: has a current medication list which includes the following prescription(s):  acetaminophen, cyclobenzaprine, dexamethasone, diazepam, fentanyl, hydrocodone-acetaminophen, hydrocodone-acetaminophen, olmesartan, ondansetron, polyethylene glycol, and potassium chloride sa.  SURGICAL HISTORY:  Past Surgical History  Procedure Laterality Date   Cholecystectomy     Esophagogastroduodenoscopy  12/18/2011    Procedure: ESOPHAGOGASTRODUODENOSCOPY (EGD);  Surgeon: Lafayette Dragon, MD;  Location: [facility];  Service: Endoscopy;  Laterality: N/A;   Colonoscopy  12/18/2011    Procedure: COLONOSCOPY;  Surgeon: Lafayette Dragon, MD;  Location: [facility];  Service: Endoscopy;  Laterality: N/A;   Ureteral stent placement     Vertebroplasty N/A 07/14/2013    Procedure: VERTEBROPLASTY WITH LUMBAR THREE BIOPSY;  Surgeon: Charlie Pitter, MD;  Location: [facility];  Service: Neurosurgery;  Laterality: N/A;  VERTEBROPLASTY WITH LUMBAR THREE BIOPSY    FAMILY HISTORY: family history includes Breast cancer (age of onset: 52) in her sister; Breast cancer (age of onset: 50) in her other.  SOCIAL HISTORY:  reports that she has never smoked. She has never used smokeless tobacco. She reports that she drinks alcohol. She reports that she does not use illicit drugs.  REVIEW OF SYSTEMS:  Other than that discussed above is noncontributory.  PHYSICAL EXAMINATION: ECOG PERFORMANCE STATUS: 1 - Symptomatic but completely ambulatory  There were no vitals taken for this visit.  GENERAL:alert, no distress and comfortable SKIN: skin color, texture, turgor are normal, no rashes or significant lesions EYES: PERLA; Conjunctiva are pink and non-injected, sclera clear SINUSES: No redness or tenderness over maxillary or ethmoid sinuses OROPHARYNX:no exudate, no erythema on lips, buccal mucosa, or tongue. NECK: supple, thyroid normal size, non-tender, without nodularity. No masses CHEST: Normal AP diameter  with no breast masses. LYMPH:  no palpable lymphadenopathy in the cervical, axillary or  inguinal LUNGS: clear to auscultation and percussion with normal breathing effort HEART: regular rate & rhythm and no murmurs. ABDOMEN:abdomen soft, non-tender and normal bowel sounds MUSCULOSKELETAL:no cyanosis of digits and no clubbing. Range of motion normal. Less tenderness over the lumbar spine.  NEURO: alert & oriented x 3 with fluent speech, no focal motor/sensory deficits   LABORATORY DATA; Results for ABIMBOLA, AKI (MRN [id number]) as of 08/21/2013 14:06  Ref. Range 07/12/2013 16:15 07/13/2013 06:55 07/13/2013 10:45 08/19/2013 16:25  Calcium Latest Range: 8.4-10.5 mg/dL 10.4 9.9 10.3 11.2 (H)       Office Visit on 08/19/2013  Component Date Value Ref Range Status   WBC 08/19/2013 8.5  4.0 - 10.5 K/uL Final   RBC 08/19/2013 4.79  3.87 - 5.11 MIL/uL Final   Hemoglobin 08/19/2013 12.5  12.0 - 15.0 g/dL Final   HCT 08/19/2013 38.7  36.0 - 46.0 % Final   MCV 08/19/2013 80.8  78.0 - 100.0 fL Final   MCH 08/19/2013 26.1  26.0 - 34.0 pg Final   MCHC 08/19/2013 32.3  30.0 - 36.0 g/dL Final   RDW 08/19/2013 13.9  11.5 - 15.5 % Final   Platelets 08/19/2013 459* 150 - 400 K/uL Final   Neutrophils Relative % 08/19/2013 58  43 - 77 % Final   Neutro Abs 08/19/2013 5.0  1.7 - 7.7 K/uL Final   Lymphocytes Relative 08/19/2013 35  12 - 46 % Final   Lymphs Abs 08/19/2013 3.0  0.7 - 4.0 K/uL Final   Monocytes Relative 08/19/2013 6  3 - 12 % Final   Monocytes Absolute 08/19/2013 0.5  0.1 - 1.0 K/uL Final   Eosinophils Relative 08/19/2013 1  0 - 5 % Final   Eosinophils Absolute 08/19/2013 0.1  0.0 - 0.7 K/uL Final   Basophils Relative 08/19/2013 0  0 - 1 % Final   Basophils Absolute 08/19/2013 0.0  0.0 - 0.1 K/uL Final   Retic Ct Pct 08/19/2013 1.2  0.4 - 3.1 % Final   RBC. 08/19/2013 4.79  3.87 - 5.11 MIL/uL Final   Retic Count, Manual 08/19/2013 57.5  19.0 - 186.0 K/uL Final   Sodium 08/19/2013 139  137 - 147 mEq/L Final   Potassium 08/19/2013 3.2* 3.7 - 5.3 mEq/L Final    Chloride 08/19/2013 99  96 - 112 mEq/L Final   CO2 08/19/2013 26  19 - 32 mEq/L Final   Glucose, Bld 08/19/2013 129* 70 - 99 mg/dL Final   BUN 08/19/2013 8  6 - 23 mg/dL Final   Creatinine, Ser 08/19/2013 0.71  0.50 - 1.10 mg/dL Final   Calcium 08/19/2013 11.2* 8.4 - 10.5 mg/dL Final   Total Protein 08/19/2013 7.9  6.0 - 8.3 g/dL Final   Albumin 08/19/2013 3.2* 3.5 - 5.2 g/dL Final   AST 08/19/2013 16  0 - 37 U/L Final   ALT 08/19/2013 6  0 - 35 U/L Final   Alkaline Phosphatase 08/19/2013 86  39 - 117 U/L Final   Total Bilirubin 08/19/2013 0.3  0.3 - 1.2 mg/dL Final   GFR calc non Af Amer 08/19/2013 >90  >90 mL/min Final   GFR calc Af Amer 08/19/2013 >90  >90 mL/min Final   Comment: (NOTE)                          The eGFR has been calculated using the CKD EPI  equation.                          This calculation has not been validated in all clinical situations.                          eGFR's persistently <90 mL/min signify possible Chronic Kidney                          Disease.   Beta-2 Microglobulin 08/19/2013 3.51* <=2.51 mg/L Final   Performed at [facility]   Total Protein ELP 08/19/2013 7.2  6.0 - 8.3 g/dL Final   IgG (Immunoglobin G), Serum 08/19/2013 1450  690 - 1700 mg/dL Final   IgA 08/19/2013 282  69 - 380 mg/dL Final   IgM, Serum 08/19/2013 93  52 - 322 mg/dL Final   Performed at [facility]   Immunofix Electr Int 08/19/2013 PENDING   Incomplete   Total Protein 08/19/2013 7.4  6.0 - 8.3 g/dL Final   Albumin ELP 08/19/2013 49.8* 55.8 - 66.1 % Final   Alpha-1-Globulin 08/19/2013 6.9* 2.9 - 4.9 % Final   Alpha-2-Globulin 08/19/2013 12.8* 7.1 - 11.8 % Final   Beta Globulin 08/19/2013 6.8  4.7 - 7.2 % Final   Beta 2 08/19/2013 5.7  3.2 - 6.5 % Final   Gamma Globulin 08/19/2013 18.0  11.1 - 18.8 % Final   M-Spike, % 08/19/2013 NOT DETECTED   Final   SPE Interp. 08/19/2013 (NOTE)   Final   Comment: Nonspecific pattern,  sometimes seen in association with acute                          inflammatory response pattern.                          Results are consistent with SPE performed on 07/15/2013  Reviewed by                          Odis Hollingshead, MD, PhD, FCAP (Electronic Signature on File)   Comment 08/19/2013 (NOTE)   Final   Comment: ---------------                          Serum protein electrophoresis is a useful screening procedure in the                          detection of various pathophysiologic states such as inflammation,                          gammopathies, protein loss and other dysproteinemias.  Immunofixation                          electrophoresis (IFE) is a more sensitive technique for the                          identification of M-proteins found in patients with monoclonal                          gammopathy of unknown significance (MGUS), amyloidosis, early or  treated myeloma or macroglobulinemia, solitary plasmacytoma or                          extramedullary plasmacytoma.   IgG (Immunoglobin G), Serum 08/19/2013 1480  690 - 1700 mg/dL Final   IgA 08/19/2013 282  69 - 380 mg/dL Final   IgM, Serum 08/19/2013 96  52 - 322 mg/dL Final   Performed at [facility] Int 08/19/2013 PENDING   Incomplete   Kappa free light chain 08/19/2013 2.87* 0.33 - 1.94 mg/dL Final   Lamda free light chains 08/19/2013 3.41* 0.57 - 2.63 mg/dL Final   Kappa, lamda light chain ratio 08/19/2013 0.84  0.26 - 1.65 Final   Performed at [facility]: for DEBORH, PENSE ([id number]) Patient: Levell July Collected: 07/14/2013 Client: [facility] Accession: [id number] Received: 07/15/2013 Earnie Larsson DOB: 26-Jun-1950 Age: 73 Gender: F Reported: 07/18/2013 [address] Patient Ph: [phone] MRN #: [id number] [place], [place] [address] Visit #: [id number] Chart #: Phone:  Fax: CC: Donato Heinz, MD REPORT  OF SURGICAL PATHOLOGY FINAL DIAGNOSIS Diagnosis Bone, biopsy, lumbar three - LAMELLAR BONE WITH ASSOCIATED FIBROUS TISSUE AND INCREASED PLASMA CELLS. - THERE IS NO EVIDENCE OF MALIGNANCY. - SEE COMMENT. Microscopic Comment Pending immunohistochemical stains. Diagnosis Note There are scattered plasma cells (approximately 5-10%), however, no aggregates are seen. Light chain immunohistochemistry is technically unsatisfactory. Pancytokeratin is negative. Correlation with clinical, radiographic, and laboratory data (SPEP, etc.) is recommended. Vicente Males MD Pathologist, Electronic Signature (Case signed 07/18/2013) Specimen Gross and Clinical Information Specimen(s) Obtained: Bone, biopsy, lumbar three Specimen Clinical Information Lumbar three fracture (tl) Gross Received fresh is a 1 cm in length and 0.3 cm in diameter core of tan-yellow to hyperemic firm bone, submitted in one block following decalcification. (SSW:ecj 07/15/2013) Stain(s) used in Diagnosis: The following stain(s) were used in diagnosing the case: LAMBDA, KAPPA, CD 138, CK AE1AE3. The control(s) stained appropriately. 1 of 2 FINAL for BRYTNEY, SOMES ([id number]) Disclaimer Some of these immunohistochemical stains may have been developed and the performance characteristics determined by [facility]. Some may not have been cleared or approved by the U.S. Food and Drug Administration. The FDA has determined that such clearance or approval is not necessary. This test is used for clinical purposes. It should not be regarded as investigational or for research. This laboratory is certified under the [facility] (CLIA-88) as qualified to perform high complexity clinical laboratory testing. Report signed out from the following location(s) Technical Component performed at [facility] [place], [place], [place] [address]. CLIA #: [id number], Technical  Component performed at [facility], [place], [place] [address]. CLIA #: [id number], Interpretation performed at [facility], [place], [place] [address]. CLIA #: [id number],  Urinalysis No results found for this basename: colorurine,  appearanceur,  labspec,  phurine,  glucoseu,  hgbur,  bilirubinur,  ketonesur,  proteinur,  urobilinogen,  nitrite,  leukocytesur    RADIOGRAPHIC STUDIES: Dg Bone Survey Met  08/20/2013   CLINICAL DATA:  Monoclonal gammopathy  EXAM: METASTATIC BONE SURVEY  COMPARISON:  Bone scan 07/16/2013  FINDINGS: Moderately large hiatal hernia with bibasilar atelectasis.  Moderate compression fracture T7 of indeterminate age. Right-sided cement vertebral augmentation at L3 with minimal loss of height. No other fracture.  Lucent skull lesion in the occipital bone with ill-defined margins. No other skull  lesions. This lesion is more likely to be a benign lesion such is epidermoid rather than a solitary myeloma lesion.  No lytic skull lesions are seen suggestive of myeloma.  IMPRESSION: Moderate compression fracture T7 indeterminate age  Cement vertebral augmentation L3.  Lucent skull lesion, favor benign lesion such as epidermoid. No other lesions are seen suggestive of myeloma.   Electronically Signed   By: Franchot Gallo M.D.   On: 08/20/2013 08:51    ASSESSMENT:  #1. 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 #2. Pain control improved #3. Hypertension, on treatment and controlled. #4. Hypercalcemia    PLAN:  #1. Fentanyl patch 12 patch every 3 days and continue daily milk of magnesia. #2. After 3 more days of dexamethasone 8 mg each morning, decrease dose to 4 mg daily until next visit. #3. Referral to radiation oncology for consideration of palliative radiotherapy to the lumbar spine area that was recently treated with vertebroplasty.  #4.  Continue to use hydrocodone for breakthrough pain. #5. Followup in 2 weeks with the lab tests. Will probably begin zoledronic acid 4 mg intravenously every 3 months.   All questions were answered. The patient knows to call the clinic with any problems, questions or concerns. We can certainly see the patient much sooner if necessary.   I spent 25 minutes counseling the patient face to face. The total time spent in the appointment was 30 minutes.    Doroteo Bradford, MD 08/21/2013 2:42 PM  DISCLAIMER:  This note was dictated with voice recognition software.  Similar sounding words can inadvertently be transcribed inaccurately and may not be corrected upon review.

## 2013-08-22 ENCOUNTER — Telehealth (HOSPITAL_COMMUNITY): Payer: Self-pay

## 2013-08-22 NOTE — Telephone Encounter (Signed)
Message copied by Mellissa Kohut on Fri Aug 22, 2013  1:27 PM ------      Message from: [place], [place]: Wed Aug 20, 2013  6:23 AM       Please inform Ms. Laton that her potassium is low and I have ordered supplements from her pharmacy---K-dur 20 BID.  Thanks. Dr.F ------

## 2013-08-22 NOTE — Telephone Encounter (Signed)
Patient notified and prescription has been picked up.

## 2013-08-25 LAB — UIFE/LIGHT CHAINS/TP QN, 24-HR UR
Albumin, U: DETECTED
Alpha 1, Urine: DETECTED — AB
Alpha 2, Urine: DETECTED — AB
Beta, Urine: DETECTED — AB
Free Kappa Lt Chains,Ur: 4.1 mg/dL — ABNORMAL HIGH (ref 0.14–2.42)
Free Kappa/Lambda Ratio: 7.74 ratio (ref 2.04–10.37)
Free Lambda Excretion/Day: 6.1 mg/d
Free Lambda Lt Chains,Ur: 0.53 mg/dL (ref 0.02–0.67)
Free Lt Chn Excr Rate: 47.15 mg/d
Gamma Globulin, Urine: DETECTED — AB
Time: 24 hours
Total Protein, Urine-Ur/day: 60 mg/d (ref 10–140)
Total Protein, Urine: 5.2 mg/dL
Volume, Urine: 1150 mL

## 2013-08-26 LAB — IMMUNOFIXATION, URINE

## 2013-09-05 ENCOUNTER — Encounter (HOSPITAL_COMMUNITY): Payer: BC Managed Care – PPO | Attending: Hematology and Oncology

## 2013-09-05 ENCOUNTER — Encounter (HOSPITAL_COMMUNITY): Payer: Self-pay

## 2013-09-05 VITALS — BP 106/56 | HR 92 | Temp 97.4°F | Resp 18 | Wt 170.6 lb

## 2013-09-05 DIAGNOSIS — D72822 Plasmacytosis: Secondary | ICD-10-CM

## 2013-09-05 LAB — CBC WITH DIFFERENTIAL/PLATELET
Basophils Absolute: 0 10*3/uL (ref 0.0–0.1)
Basophils Relative: 0 % (ref 0–1)
Eosinophils Absolute: 0 10*3/uL (ref 0.0–0.7)
Eosinophils Relative: 0 % (ref 0–5)
HCT: 36.8 % (ref 36.0–46.0)
Hemoglobin: 11.8 g/dL — ABNORMAL LOW (ref 12.0–15.0)
Lymphocytes Relative: 19 % (ref 12–46)
Lymphs Abs: 2.2 10*3/uL (ref 0.7–4.0)
MCH: 26.5 pg (ref 26.0–34.0)
MCHC: 32.1 g/dL (ref 30.0–36.0)
MCV: 82.5 fL (ref 78.0–100.0)
Monocytes Absolute: 0.9 10*3/uL (ref 0.1–1.0)
Monocytes Relative: 7 % (ref 3–12)
Neutro Abs: 8.7 10*3/uL — ABNORMAL HIGH (ref 1.7–7.7)
Neutrophils Relative %: 74 % (ref 43–77)
Platelets: 260 10*3/uL (ref 150–400)
RBC: 4.46 MIL/uL (ref 3.87–5.11)
RDW: 15.5 % (ref 11.5–15.5)
WBC: 11.8 10*3/uL — ABNORMAL HIGH (ref 4.0–10.5)

## 2013-09-05 LAB — COMPREHENSIVE METABOLIC PANEL
ALT: 23 U/L (ref 0–35)
AST: 18 U/L (ref 0–37)
Albumin: 3 g/dL — ABNORMAL LOW (ref 3.5–5.2)
Alkaline Phosphatase: 96 U/L (ref 39–117)
Anion gap: 11 (ref 5–15)
BUN: 16 mg/dL (ref 6–23)
CO2: 22 mEq/L (ref 19–32)
Calcium: 10.1 mg/dL (ref 8.4–10.5)
Chloride: 105 mEq/L (ref 96–112)
Creatinine, Ser: 0.78 mg/dL (ref 0.50–1.10)
GFR calc Af Amer: >90 mL/min (ref >90)
GFR calc non Af Amer: 88 mL/min — ABNORMAL LOW (ref >90)
Glucose, Bld: 112 mg/dL — ABNORMAL HIGH (ref 70–99)
Potassium: 4.2 mEq/L (ref 3.7–5.3)
Sodium: 138 mEq/L (ref 137–147)
Total Bilirubin: 0.5 mg/dL (ref 0.3–1.2)
Total Protein: 6.8 g/dL (ref 6.0–8.3)

## 2013-09-05 MED ORDER — HYDROCODONE-ACETAMINOPHEN 10-325 MG PO TABS: ORAL_TABLET | ORAL | Status: DC

## 2013-09-05 MED ORDER — OXYCODONE HCL ER 20 MG PO T12A: 20.0000 mg | EXTENDED_RELEASE_TABLET | Freq: Two times a day (BID) | ORAL | Status: DC

## 2013-09-05 MED ORDER — DIAZEPAM 5 MG PO TABS: 5.0000 mg | ORAL_TABLET | Freq: Three times a day (TID) | ORAL | Status: DC | PRN

## 2013-09-05 NOTE — Progress Notes (Signed)
[facility]  OFFICE PROGRESS NOTE  [name], MD [place] [address]  DIAGNOSIS: Plasmacytosis - Plan: CBC with Differential, Comprehensive metabolic panel  No chief complaint on file.   CURRENT THERAPY: Sentinel 12 patch every 3 days with milk of magnesia and dexamethasone 4 mg daily along with hydrocodone for breakthrough pain.  INTERVAL HISTORY: Autumn Duran 63 y.o. female returns for followup of plasmacytoma involving L3 found that vertebroplasty with workup findings consistent with smoldering myeloma.  She was evaluated by Dr. Isidore Moos on 08/26/2013 for consideration of palliative radiotherapy to the lumbar spine area of previous vertebroplasty. Currently receiving radiotherapy #5 of a planned 10. Left hip discomfort has increased to some degree. She denies any peripheral paresthesias, diarrhea, constipation, but they get nauseated with fentanyl and felt out of sorts so she stopped the patch. She is taking about 60 mg of hydrocodone per day. She denies any fever, night sweats, other bone pain, cough, sore mouth, vaginal discharge or itching, PND, orthopnea, palpitations, headache, or seizures. She has developed a fever or sore in her mouth.  MEDICAL HISTORY: Past Medical History  Diagnosis Date  . Diverticulosis   . Hemorrhoids   . Hx of adenomatous colonic polyps   . Hiatal hernia   . Hypertension   . Hyperlipidemia   . GERD (gastroesophageal reflux disease)   . Chronic kidney disease     kidney stone s/p stent placement ( removed)  . Complication of anesthesia   . PONV (postoperative nausea and vomiting)   . Bronchitis, chronic     INTERIM HISTORY: has Benign neoplasm of colon; Esophageal reflux; Other dysphagia; Lumbar compression fracture; Compression fracture; Hypercalcemia; and Hypokalemia on her problem list.    ALLERGIES:  is allergic to morphine and related and tetracyclines & related.  MEDICATIONS: has a  current medication list which includes the following prescription(s): dexamethasone, diazepam, hydrocodone-acetaminophen, potassium chloride sa, acetaminophen, cyclobenzaprine, fentanyl, hydrocodone-acetaminophen, olmesartan, ondansetron, and polyethylene glycol.  SURGICAL HISTORY:  Past Surgical History  Procedure Laterality Date  . Cholecystectomy    . Esophagogastroduodenoscopy  12/18/2011    Procedure: ESOPHAGOGASTRODUODENOSCOPY (EGD);  Surgeon: Lafayette Dragon, MD;  Location: [facility];  Service: Endoscopy;  Laterality: N/A;  . Colonoscopy  12/18/2011    Procedure: COLONOSCOPY;  Surgeon: Lafayette Dragon, MD;  Location: [facility];  Service: Endoscopy;  Laterality: N/A;  . Ureteral stent placement    . Vertebroplasty N/A 07/14/2013    Procedure: VERTEBROPLASTY WITH LUMBAR THREE BIOPSY;  Surgeon: Charlie Pitter, MD;  Location: [facility];  Service: Neurosurgery;  Laterality: N/A;  VERTEBROPLASTY WITH LUMBAR THREE BIOPSY    FAMILY HISTORY: family history includes Breast cancer (age of onset: 16) in her sister; Breast cancer (age of onset: 40) in her other.  SOCIAL HISTORY:  reports that she has never smoked. She has never used smokeless tobacco. She reports that she drinks alcohol. She reports that she does not use illicit drugs.  REVIEW OF SYSTEMS:  Other than that discussed above is noncontributory.  PHYSICAL EXAMINATION: ECOG PERFORMANCE STATUS: 1 - Symptomatic but completely ambulatory  There were no vitals taken for this visit.  GENERAL:alert, no distress and comfortable SKIN: skin color, texture, turgor are normal, no rashes or significant lesions EYES: PERLA; Conjunctiva are pink and non-injected, sclera clear SINUSES: No redness or tenderness over maxillary or ethmoid sinuses OROPHARYNX:no exudate, no erythema on lips, buccal mucosa, or tongue. NECK: supple, thyroid normal size,  non-tender, without nodularity. No masses CHEST: Normal in diameter with no breast  masses. LYMPH:  no palpable lymphadenopathy in the cervical, axillary or inguinal LUNGS: clear to auscultation and percussion with normal breathing effort HEART: regular rate & rhythm and no murmurs. ABDOMEN:abdomen soft, non-tender and normal bowel sounds MUSCULOSKELETAL:no cyanosis of digits and no clubbing.l. Tenderness over the lumbar spine. Full range of motion of the left hip. NEURO: alert & oriented x 3 with fluent speech, no focal motor/sensory deficits   LABORATORY DATA: Office Visit on 08/21/2013  Component Date Value Ref Range Status  . Time 08/21/2013 24   Final  . Volume, Urine 08/21/2013 1150   Final  . Total Protein, Urine 08/21/2013 5.2   Final   No established reference range.  . Total Protein, Urine-Ur/day 08/21/2013 60  10 - 140 mg/day Final   Comment: (NOTE)                          Total urinary protein is determined by adding the albumin and Kappa                          and/or Lambda light chains.  This value may not agree with the total                          protein as determined by chemical methods, which characteristically                          underestimate urinary light chains.  . Albumin, U 08/21/2013 DETECTED  DETECTED Final  . Alpha 1, Urine 08/21/2013 DETECTED* NONE DETECTED Final  . Alpha 2, Urine 08/21/2013 DETECTED* NONE DETECTED Final  . Beta, Urine 08/21/2013 DETECTED* NONE DETECTED Final  . Gamma Globulin, Urine 08/21/2013 DETECTED* NONE DETECTED Final  . Free Kappa Lt Chains,Ur 08/21/2013 4.10* 0.14 - 2.42 mg/dL Final  . Free Lt Chn Excr Rate 08/21/2013 47.15   Final  . Free Lambda Lt Chains,Ur 08/21/2013 0.53  0.02 - 0.67 mg/dL Final  . Free Lambda Excretion/Day 08/21/2013 6.10   Final  . Free Kappa/Lambda Ratio 08/21/2013 7.74  2.04 - 10.37 ratio Final   (NOTE)  . Immunofixation, Urine 08/21/2013 (NOTE)   Final   Comment: No monoclonal free light chains (Bence Jones Protein) are detected.                          Urine IFE shows  polyclonal increase in free Kappa and/or free Lambda                          light chains.                          Reviewed by Odis Hollingshead, MD, PhD, FCAP (Electronic Signature on                          File)                          Performed at [facility]  . Immunofixation, Urine 08/21/2013 (NOTE)   Final   Comment: No monoclonal free light chains (Bence Jones Protein) are detected.  Urine IFE shows polyclonal increase in free Kappa and/or free Lambda                          light chains.                          Reviewed by Odis Hollingshead, MD, PhD, FCAP (Electronic Signature on                          File)                          Performed at [facility] Visit on 08/19/2013  Component Date Value Ref Range Status  . WBC 08/19/2013 8.5  4.0 - 10.5 K/uL Final  . RBC 08/19/2013 4.79  3.87 - 5.11 MIL/uL Final  . Hemoglobin 08/19/2013 12.5  12.0 - 15.0 g/dL Final  . HCT 08/19/2013 38.7  36.0 - 46.0 % Final  . MCV 08/19/2013 80.8  78.0 - 100.0 fL Final  . MCH 08/19/2013 26.1  26.0 - 34.0 pg Final  . MCHC 08/19/2013 32.3  30.0 - 36.0 g/dL Final  . RDW 08/19/2013 13.9  11.5 - 15.5 % Final  . Platelets 08/19/2013 459* 150 - 400 K/uL Final  . Neutrophils Relative % 08/19/2013 58  43 - 77 % Final  . Neutro Abs 08/19/2013 5.0  1.7 - 7.7 K/uL Final  . Lymphocytes Relative 08/19/2013 35  12 - 46 % Final  . Lymphs Abs 08/19/2013 3.0  0.7 - 4.0 K/uL Final  . Monocytes Relative 08/19/2013 6  3 - 12 % Final  . Monocytes Absolute 08/19/2013 0.5  0.1 - 1.0 K/uL Final  . Eosinophils Relative 08/19/2013 1  0 - 5 % Final  . Eosinophils Absolute 08/19/2013 0.1  0.0 - 0.7 K/uL Final  . Basophils Relative 08/19/2013 0  0 - 1 % Final  . Basophils Absolute 08/19/2013 0.0  0.0 - 0.1 K/uL Final  . Retic Ct Pct 08/19/2013 1.2  0.4 - 3.1 % Final  . RBC. 08/19/2013 4.79  3.87 - 5.11 MIL/uL Final  . Retic Count, Manual 08/19/2013 57.5  19.0 -  186.0 K/uL Final  . Sodium 08/19/2013 139  137 - 147 mEq/L Final  . Potassium 08/19/2013 3.2* 3.7 - 5.3 mEq/L Final  . Chloride 08/19/2013 99  96 - 112 mEq/L Final  . CO2 08/19/2013 26  19 - 32 mEq/L Final  . Glucose, Bld 08/19/2013 129* 70 - 99 mg/dL Final  . BUN 08/19/2013 8  6 - 23 mg/dL Final  . Creatinine, Ser 08/19/2013 0.71  0.50 - 1.10 mg/dL Final  . Calcium 08/19/2013 11.2* 8.4 - 10.5 mg/dL Final  . Total Protein 08/19/2013 7.9  6.0 - 8.3 g/dL Final  . Albumin 08/19/2013 3.2* 3.5 - 5.2 g/dL Final  . AST 08/19/2013 16  0 - 37 U/L Final  . ALT 08/19/2013 6  0 - 35 U/L Final  . Alkaline Phosphatase 08/19/2013 86  39 - 117 U/L Final  . Total Bilirubin 08/19/2013 0.3  0.3 - 1.2 mg/dL Final  . GFR calc non Af Amer 08/19/2013 >90  >90 mL/min Final  . GFR calc Af Amer 08/19/2013 >90  >90 mL/min Final   Comment: (NOTE)  The eGFR has been calculated using the CKD EPI equation.                          This calculation has not been validated in all clinical situations.                          eGFR's persistently <90 mL/min signify possible Chronic Kidney                          Disease.  . Beta-2 Microglobulin 08/19/2013 3.51* <=2.51 mg/L Final   Performed at [facility]  . Total Protein ELP 08/19/2013 7.2  6.0 - 8.3 g/dL Final  . IgG (Immunoglobin G), Serum 08/19/2013 1450  690 - 1700 mg/dL Final  . IgA 08/19/2013 282  69 - 380 mg/dL Final  . IgM, Serum 08/19/2013 93  52 - 322 mg/dL Final  . Immunofix Electr Int 08/19/2013 (NOTE)   Final   Comment: No monoclonal protein identified.                          Reviewed by Odis Hollingshead, MD, PhD, FCAP (Electronic Signature on                          File)                          Performed at [facility]  . Total Protein 08/19/2013 7.4  6.0 - 8.3 g/dL Final  . Albumin ELP 08/19/2013 49.8* 55.8 - 66.1 % Final  . Alpha-1-Globulin 08/19/2013 6.9* 2.9 - 4.9 % Final  . Alpha-2-Globulin  08/19/2013 12.8* 7.1 - 11.8 % Final  . Beta Globulin 08/19/2013 6.8  4.7 - 7.2 % Final  . Beta 2 08/19/2013 5.7  3.2 - 6.5 % Final  . Gamma Globulin 08/19/2013 18.0  11.1 - 18.8 % Final  . M-Spike, % 08/19/2013 NOT DETECTED   Final  . SPE Interp. 08/19/2013 (NOTE)   Final   Comment: Nonspecific pattern, sometimes seen in association with acute                          inflammatory response pattern.                          Results are consistent with SPE performed on 07/15/2013  Reviewed by                          Odis Hollingshead, MD, PhD, FCAP (Electronic Signature on File)  . Comment 08/19/2013 (NOTE)   Final   Comment: ---------------                          Serum protein electrophoresis is a useful screening procedure in the                          detection of various pathophysiologic states such as inflammation,                          gammopathies, protein loss and other dysproteinemias.  Immunofixation                          electrophoresis (IFE) is a more sensitive technique for the                          identification of M-proteins found in patients with monoclonal                          gammopathy of unknown significance (MGUS), amyloidosis, early or                          treated myeloma or macroglobulinemia, solitary plasmacytoma or                          extramedullary plasmacytoma.  . IgG (Immunoglobin G), Serum 08/19/2013 1480  690 - 1700 mg/dL Final  . IgA 08/19/2013 282  69 - 380 mg/dL Final  . IgM, Serum 08/19/2013 96  52 - 322 mg/dL Final  . Immunofix Electr Int 08/19/2013 (NOTE)   Final   Comment: No monoclonal protein identified.                          Reviewed by Odis Hollingshead, MD, PhD, FCAP (Electronic Signature on                          File)                          Performed at [facility]  . Kappa free light chain 08/19/2013 2.87* 0.33 - 1.94 mg/dL Final  . Lamda free light chains 08/19/2013 3.41* 0.57 - 2.63 mg/dL Final  .  Kappa, lamda light chain ratio 08/19/2013 0.84  0.26 - 1.65 Final   Performed at [facility]: No new pathology  Urinalysis No results found for this basename: colorurine, appearanceur, labspec, phurine, glucoseu, hgbur, bilirubinur, ketonesur, proteinur, urobilinogen, nitrite, leukocytesur    RADIOGRAPHIC STUDIES: Dg Bone Survey Met  08/20/2013   CLINICAL DATA:  Monoclonal gammopathy  EXAM: METASTATIC BONE SURVEY  COMPARISON:  Bone scan 07/16/2013  FINDINGS: Moderately large hiatal hernia with bibasilar atelectasis.  Moderate compression fracture T7 of indeterminate age. Right-sided cement vertebral augmentation at L3 with minimal loss of height. No other fracture.  Lucent skull lesion in the occipital bone with ill-defined margins. No other skull lesions. This lesion is more likely to be a benign lesion such is epidermoid rather than a solitary myeloma lesion.  No lytic skull lesions are seen suggestive of myeloma.  IMPRESSION: Moderate compression fracture T7 indeterminate age  Cement vertebral augmentation L3.  Lucent skull lesion, favor benign lesion such as epidermoid. No other lesions are seen suggestive of myeloma.   Electronically Signed   By: Franchot Gallo M.D.   On: 08/20/2013 08:51    ASSESSMENT:  #1. L3 pathological fracture possibly due to plasmacytoma with workup consistent with smoldering myeloma., Possibly increased swelling near or nerve root producing increasing left hip pain while receiving radiation. #2. Hypertension, on treatment controlled. #3. Hypercalcemia. #4. Fever blister oral cavity   PLAN:  #1. Increase dexamethasone to 8 mg each morning until 2 days following completion of radiation and then resume  5 mg daily. #2. OxyContin 20 mg every 12 hours with hydrocodone 10/325 up to every 4 hours for breakthrough pain. #3. Campho-Phenique 2 oral ulcer. #4. Continue radiotherapy. #5. Followup with Dr. Annette Stable. #6. Office visit in 4 weeks with CBC,  chem profile. #3. Campho-Phenique 2 oral fever blister   All questions were answered. The patient knows to call the clinic with any problems, questions or concerns. We can certainly see the patient much sooner if necessary.   I spent 25 minutes counseling the patient face to face. The total time spent in the appointment was 30 minutes.    Doroteo Bradford, MD 09/05/2013 1:25 PM  DISCLAIMER:  This note was dictated with voice recognition software.  Similar sounding words can inadvertently be transcribed inaccurately and may not be corrected upon review.

## 2013-09-05 NOTE — Progress Notes (Signed)
Autumn Duran's reason for visit today are for labs as scheduled per MD orders.  Venipuncture performed with a 23 gauge butterfly needle to R Antecubital.  Autumn Duran tolerated venipuncture well and without incident; questions were answered and patient was discharged.

## 2013-09-05 NOTE — Patient Instructions (Signed)
[facility] Discharge Instructions  RECOMMENDATIONS MADE BY THE CONSULTANT AND ANY TEST RESULTS WILL BE SENT TO YOUR REFERRING PHYSICIAN.  Increase dexamethasone to 8 mg each morning until 2 days following completion of radiation and then resume 5 mg daily.   OxyContin 20 mg every 12 hours with hydrocodone 10/325 up to every 4 hours for breakthrough pain.   Campho-Phenique 2 oral ulcer.   Continue radiotherapy.   Followup with Dr. Annette Stable.   Office visit in 4 weeks with CBC, chem profile.   Campho-Phenique 2 oral fever blister   Thank you for choosing [facility] to provide your oncology and hematology care.  To afford each patient quality time with our providers, please arrive at least 15 minutes before your scheduled appointment time.  With your help, our goal is to use those 15 minutes to complete the necessary work-up to ensure our physicians have the information they need to help with your evaluation and healthcare recommendations.    Effective January 1st, 2014, we ask that you re-schedule your appointment with our physicians should you arrive 10 or more minutes late for your appointment.  We strive to give you quality time with our providers, and arriving late affects you and other patients whose appointments are after yours.    Again, thank you for choosing [facility].  Our hope is that these requests will decrease the amount of time that you wait before being seen by our physicians.       _____________________________________________________________  Should you have questions after your visit to [facility], please contact our office at [phone] between the hours of 8:30 a.m. and 4:30 p.m.  Voicemails left after 4:30 p.m. will not be returned until the following business day.  For prescription refill requests, have your pharmacy contact our office with your prescription refill request.     _______________________________________________________________  We hope that we have given you very good care.  You may receive a patient satisfaction survey in the mail, please complete it and return it as soon as possible.  We value your feedback!  _______________________________________________________________  Have you asked about our STAR program?  STAR stands for Survivorship Training and Rehabilitation, and this is a nationally recognized cancer care program that focuses on survivorship and rehabilitation.  Cancer and cancer treatments may cause problems, such as, pain, making you feel tired and keeping you from doing the things that you need or want to do. Cancer rehabilitation can help. Our goal is to reduce these troubling effects and help you have the best quality of life possible.  You may receive a survey from a nurse that asks questions about your current state of health.  Based on the survey results, all eligible patients will be referred to the [facility] program for an evaluation so we can better serve you!  A frequently asked questions sheet is available upon request.

## 2013-09-05 NOTE — Addendum Note (Signed)
Addended by: Jeannette How D on: 09/05/2013 02:10 PM   Modules accepted: Orders

## 2013-09-19 ENCOUNTER — Encounter (HOSPITAL_COMMUNITY): Payer: Self-pay | Admitting: Emergency Medicine

## 2013-09-19 ENCOUNTER — Emergency Department (HOSPITAL_COMMUNITY)
Admission: EM | Admit: 2013-09-19 | Discharge: 2013-09-19 | Disposition: A | Discharge status: Home / Self Care | Payer: BC Managed Care – PPO | Attending: Emergency Medicine | Admitting: Emergency Medicine

## 2013-09-19 ENCOUNTER — Telehealth (HOSPITAL_COMMUNITY): Payer: Self-pay

## 2013-09-19 ENCOUNTER — Emergency Department (HOSPITAL_COMMUNITY): Payer: BC Managed Care – PPO

## 2013-09-19 DIAGNOSIS — Z8639 Personal history of other endocrine, nutritional and metabolic disease: Secondary | ICD-10-CM | POA: Insufficient documentation

## 2013-09-19 DIAGNOSIS — IMO0002 Reserved for concepts with insufficient information to code with codable children: Secondary | ICD-10-CM | POA: Insufficient documentation

## 2013-09-19 DIAGNOSIS — N189 Chronic kidney disease, unspecified: Secondary | ICD-10-CM | POA: Insufficient documentation

## 2013-09-19 DIAGNOSIS — Z87442 Personal history of urinary calculi: Secondary | ICD-10-CM | POA: Insufficient documentation

## 2013-09-19 DIAGNOSIS — G8929 Other chronic pain: Secondary | ICD-10-CM | POA: Insufficient documentation

## 2013-09-19 DIAGNOSIS — K219 Gastro-esophageal reflux disease without esophagitis: Secondary | ICD-10-CM | POA: Insufficient documentation

## 2013-09-19 DIAGNOSIS — I129 Hypertensive chronic kidney disease with stage 1 through stage 4 chronic kidney disease, or unspecified chronic kidney disease: Secondary | ICD-10-CM | POA: Insufficient documentation

## 2013-09-19 DIAGNOSIS — Z860100 Personal history of colon polyps, unspecified: Secondary | ICD-10-CM | POA: Insufficient documentation

## 2013-09-19 DIAGNOSIS — M5430 Sciatica, unspecified side: Secondary | ICD-10-CM | POA: Insufficient documentation

## 2013-09-19 DIAGNOSIS — Z862 Personal history of diseases of the blood and blood-forming organs and certain disorders involving the immune mechanism: Secondary | ICD-10-CM | POA: Insufficient documentation

## 2013-09-19 DIAGNOSIS — M5432 Sciatica, left side: Secondary | ICD-10-CM

## 2013-09-19 DIAGNOSIS — Z8709 Personal history of other diseases of the respiratory system: Secondary | ICD-10-CM | POA: Insufficient documentation

## 2013-09-19 DIAGNOSIS — M79609 Pain in unspecified limb: Secondary | ICD-10-CM | POA: Insufficient documentation

## 2013-09-19 DIAGNOSIS — Z79899 Other long term (current) drug therapy: Secondary | ICD-10-CM | POA: Insufficient documentation

## 2013-09-19 DIAGNOSIS — Z8781 Personal history of (healed) traumatic fracture: Secondary | ICD-10-CM | POA: Insufficient documentation

## 2013-09-19 HISTORY — DX: Other chronic pain: G89.29

## 2013-09-19 HISTORY — DX: Dorsalgia, unspecified: M54.9

## 2013-09-19 HISTORY — DX: Unspecified fracture of T7-t8 vertebra, initial encounter for closed fracture: S22.069A

## 2013-09-19 HISTORY — DX: Radiculopathy, lumbar region: M54.16

## 2013-09-19 HISTORY — DX: Wedge compression fracture of third lumbar vertebra, initial encounter for closed fracture: S32.030A

## 2013-09-19 MED ORDER — HYDROMORPHONE HCL PF 1 MG/ML IJ SOLN
1.0000 mg | INTRAMUSCULAR | Status: DC | PRN
  Administered 2013-09-19 (×2): 1 mg via INTRAVENOUS
  Filled 2013-09-19 (×2): qty 1

## 2013-09-19 NOTE — Telephone Encounter (Signed)
Call from patient with complaints of pain in right leg now. Has had the vetebroplasty in May and saw Dr. Annette Stable yesterday.  Recommended that she contact Dr. Marchelle Folks office and discuss pain issues with them and if no assistance given, recommended that she go to the Emergency Department.  Plans to call Dr. Marchelle Folks office to discuss.

## 2013-09-19 NOTE — ED Notes (Addendum)
Pt c/o leg left pain radiating down left leg. Pt has h/s of same and has dx of smoldering myeloma. Pt was seen by neurosurgeon yesterday and is suppose to have MRI scheduled. Pt states she is on several pain medications at home with some relief.

## 2013-09-19 NOTE — Discharge Instructions (Signed)
Back Pain, Adult °Back pain is very common. The pain often gets better over time. The cause of back pain is usually not dangerous. Most people can learn to manage their back pain on their own.  °HOME CARE  °· Stay active. Start with short walks on flat ground if you can. Try to walk farther each day. °· Do not sit, drive, or stand in one place for more than 30 minutes. Do not stay in bed. °· Do not avoid exercise or work. Activity can help your back heal faster. °· Be careful when you bend or lift an object. Bend at your knees, keep the object close to you, and do not twist. °· Sleep on a firm mattress. Lie on your side, and bend your knees. If you lie on your back, put a pillow under your knees. °· Only take medicines as told by your doctor. °· Put ice on the injured area. °¨ Put ice in a plastic bag. °¨ Place a towel between your skin and the bag. °¨ Leave the ice on for 15-20 minutes, 03-04 times a day for the first 2 to 3 days. After that, you can switch between ice and heat packs. °· Ask your doctor about back exercises or massage. °· Avoid feeling anxious or stressed. Find good ways to deal with stress, such as exercise. °GET HELP RIGHT AWAY IF:  °· Your pain does not go away with rest or medicine. °· Your pain does not go away in 1 week. °· You have new problems. °· You do not feel well. °· The pain spreads into your legs. °· You cannot control when you poop (bowel movement) or pee (urinate). °· Your arms or legs feel weak or lose feeling (numbness). °· You feel sick to your stomach (nauseous) or throw up (vomit). °· You have belly (abdominal) pain. °· You feel like you may pass out (faint). °MAKE SURE YOU:  °· Understand these instructions. °· Will watch your condition. °· Will get help right away if you are not doing well or get worse. °Document Released: 08/02/2007 Document Revised: 05/08/2011 Document Reviewed: 06/17/2013 °Exit[employer]® Patient Information ©2015 [employer]. This information is not intended  to replace advice given to you by your health care provider. Make sure you discuss any questions you have with your health care provider. ° °

## 2013-09-19 NOTE — ED Provider Notes (Signed)
CSN: [id number]     Arrival date & time 09/19/13  1413 History  This chart was scribed for Dorie Rank, MD by Vernell Barrier, ED scribe. This patient was seen in room APA18/APA18 and the patient's care was started at 2:30 PM.    Chief Complaint  Patient presents with  . Leg Pain   The history is provided by the patient. No language interpreter was used.   HPI Comments: Autumn Duran is a 62 y.o. female who presents to the Emergency Department complaining of left leg pain. Pain begins in left hip and radiates all the way down the leg. Also reports some lumbar tenderness. Pain worse with ambulation. Diagnosed with smoldering myeloma and finished 10 treatments of radiation 1 week ago. Went to Dr. Annette Stable in [place] yesterday to schedule appointment for MRI but was unable schedule appointment. States she spoke with the nurse who instructed her if she did not get a call back for appointment to go the ER. Taking Oxycodone and Hydrocodone 10 mg for pain. Denies trouble urinating or diarrhea.   Past Medical History  Diagnosis Date  . Diverticulosis   . Hemorrhoids   . Hx of adenomatous colonic polyps   . Hiatal hernia   . Hypertension   . Hyperlipidemia   . GERD (gastroesophageal reflux disease)   . Chronic kidney disease     kidney stone s/p stent placement ( removed)  . Complication of anesthesia   . PONV (postoperative nausea and vomiting)   . Bronchitis, chronic   . Chronic back pain   . Lumbar radicular pain   . Compression fracture of L3 lumbar vertebra   . T7 vertebral fracture    Past Surgical History  Procedure Laterality Date  . Cholecystectomy    . Esophagogastroduodenoscopy  12/18/2011    Procedure: ESOPHAGOGASTRODUODENOSCOPY (EGD);  Surgeon: Lafayette Dragon, MD;  Location: [facility];  Service: Endoscopy;  Laterality: N/A;  . Colonoscopy  12/18/2011    Procedure: COLONOSCOPY;  Surgeon: Lafayette Dragon, MD;  Location: [facility];  Service: Endoscopy;  Laterality: N/A;  .  Ureteral stent placement    . Vertebroplasty N/A 07/14/2013    Procedure: VERTEBROPLASTY WITH LUMBAR THREE BIOPSY;  Surgeon: Charlie Pitter, MD;  Location: [facility];  Service: Neurosurgery;  Laterality: N/A;  VERTEBROPLASTY WITH LUMBAR THREE BIOPSY   Family History  Problem Relation Age of Onset  . Breast cancer Sister 53  . Breast cancer Other 65    aunt   History  Substance Use Topics  . Smoking status: Never Smoker   . Smokeless tobacco: Never Used  . Alcohol Use: Yes     Comment: social occ   OB History   Grav Para Term Preterm Abortions TAB SAB Ect Mult Living                 Review of Systems  Gastrointestinal: Negative for diarrhea.  Genitourinary: Negative for difficulty urinating.  Musculoskeletal: Negative for joint swelling.  All other systems reviewed and are negative.  Allergies  Morphine and related and Tetracyclines & related  Home Medications   Prior to Admission medications   Medication Sig Start Date End Date Taking? Authorizing Provider  dexamethasone (DECADRON) 4 MG tablet Take 4 mg by mouth daily.  08/19/13  Yes Farrel Gobble, MD  diazepam (VALIUM) 5 MG tablet Take 2.5 mg by mouth every 8 (eight) hours as needed for anxiety. 09/05/13  Yes Farrel Gobble, MD  feeding supplement (BOOST HIGH PROTEIN) LIQD Take 1  Container by mouth 3 (three) times daily between meals.   Yes Historical Provider, MD  HYDROcodone-acetaminophen (NORCO) 10-325 MG per tablet Take 1 tablet every 4 hours while awake and 2 at bedtime. 09/05/13  Yes Farrel Gobble, MD  omeprazole (PRILOSEC) 40 MG capsule Take 40 mg by mouth every other day. 08/09/13  Yes Historical Provider, MD  OxyCODONE (OXYCONTIN) 20 mg T12A 12 hr tablet Take 1 tablet (20 mg total) by mouth every 12 (twelve) hours. 09/05/13  Yes Farrel Gobble, MD  potassium chloride SA (K-DUR,KLOR-CON) 20 MEQ tablet Take 1 tablet (20 mEq total) by mouth 2 (two) times daily. 08/20/13  Yes Farrel Gobble, MD  cyclobenzaprine  (FLEXERIL) 5 MG tablet Take 1 tablet (5 mg total) by mouth 3 (three) times daily as needed for muscle spasms. 07/17/13   Thurnell Lose, MD   Triage vitals: BP 123/58  Pulse 78  Temp(Src) 98.1 F (36.7 C) (Oral)  Resp 16  Ht 5\' 1"  (1.549 m)  Wt 160 lb (72.576 kg)  BMI 30.25 kg/m2  SpO2 95%  Physical Exam  Nursing note and vitals reviewed. Constitutional: She appears well-developed and well-nourished. No distress.  HENT:  Head: Normocephalic and atraumatic.  Right Ear: External ear normal.  Left Ear: External ear normal.  Eyes: Conjunctivae are normal. Right eye exhibits no discharge. Left eye exhibits no discharge. No scleral icterus.  Neck: Neck supple. No tracheal deviation present.  Cardiovascular: Normal rate, regular rhythm and intact distal pulses.   Pulmonary/Chest: Effort normal and breath sounds normal. No stridor. No respiratory distress. She has no wheezes. She has no rales.  Abdominal: Soft. Bowel sounds are normal. She exhibits no distension. There is no tenderness. There is no rebound and no guarding.  Musculoskeletal: She exhibits no edema and no tenderness.       Lumbar back: She exhibits decreased range of motion and pain. She exhibits no bony tenderness and no swelling.       Left upper leg: She exhibits no bony tenderness, no swelling and no edema.       Left lower leg: She exhibits no bony tenderness, no swelling and no edema.  Neurological: She is alert. She has normal strength. No cranial nerve deficit (no facial droop, extraocular movements intact, no slurred speech) or sensory deficit. She exhibits normal muscle tone. She displays no seizure activity. Coordination normal.  Skin: Skin is warm and dry. No rash noted.  Psychiatric: She has a normal mood and affect.    ED Course  Procedures (including critical care time) DIAGNOSTIC STUDIES: Oxygen Saturation is 95% on room air, normal by my interpretation.    COORDINATION OF CARE: At 2:35 PM: Discussed  treatment plan with patient which includes pain medication. Patient agrees.     Imaging Review Dg Lumbar Spine Complete  09/19/2013   CLINICAL DATA:  Back pain.  EXAM: LUMBAR SPINE - COMPLETE 4+ VIEW  COMPARISON:  CT 08/26/2013.  FINDINGS: Methylmethacrylate in the L3 vertebral body. Lumbar vertebrae are numbered with the lowest segmented appearing vertebrae on lateral view as L5. No acute abnormality. Diffuse osteopenia degenerative change. Surgical clips right upper quadrant.  IMPRESSION: 1. No acute abnormality. 2. Prior vertebroplasty L3.   Electronically Signed   By: Marcello Moores  Register   On: 09/19/2013 15:25      MDM   Final diagnoses:  Sciatica, left   Symptoms sound radicular in nature to me.  She has seen her neurosurgeon as an outpatient and has an MRI scheduled.  Pain  improved after treatment in the ED.  No acute neuro deficits noted on exam.  Stable for further outpatient treatment.  I personally performed the services described in this documentation, which was scribed in my presence. The recorded information has been reviewed and is accurate.     Dorie Rank, MD 09/20/13 [phone]

## 2013-09-19 NOTE — ED Notes (Signed)
Pt reports left leg pain that is not controlled by current medications. Pt states pain starts in lower back and radiates down to her foot. Pt also reports numbness in left leg.

## 2013-09-20 ENCOUNTER — Emergency Department (HOSPITAL_COMMUNITY): Payer: BC Managed Care – PPO

## 2013-09-20 ENCOUNTER — Inpatient Hospital Stay (HOSPITAL_COMMUNITY)
Admission: EM | Admit: 2013-09-20 | Discharge: 2013-09-26 | DRG: 853 | Disposition: A | Discharge status: Home Health | Payer: BC Managed Care – PPO | Attending: Family Medicine | Admitting: Family Medicine

## 2013-09-20 ENCOUNTER — Encounter (HOSPITAL_COMMUNITY): Payer: Self-pay | Admitting: Emergency Medicine

## 2013-09-20 DIAGNOSIS — D126 Benign neoplasm of colon, unspecified: Secondary | ICD-10-CM

## 2013-09-20 DIAGNOSIS — E785 Hyperlipidemia, unspecified: Secondary | ICD-10-CM | POA: Diagnosis present

## 2013-09-20 DIAGNOSIS — M4620 Osteomyelitis of vertebra, site unspecified: Secondary | ICD-10-CM

## 2013-09-20 DIAGNOSIS — D472 Monoclonal gammopathy: Secondary | ICD-10-CM

## 2013-09-20 DIAGNOSIS — K219 Gastro-esophageal reflux disease without esophagitis: Secondary | ICD-10-CM | POA: Diagnosis present

## 2013-09-20 DIAGNOSIS — Z22322 Carrier or suspected carrier of Methicillin resistant Staphylococcus aureus: Secondary | ICD-10-CM

## 2013-09-20 DIAGNOSIS — J189 Pneumonia, unspecified organism: Secondary | ICD-10-CM

## 2013-09-20 DIAGNOSIS — I959 Hypotension, unspecified: Secondary | ICD-10-CM

## 2013-09-20 DIAGNOSIS — M4640 Discitis, unspecified, site unspecified: Secondary | ICD-10-CM

## 2013-09-20 DIAGNOSIS — A419 Sepsis, unspecified organism: Principal | ICD-10-CM | POA: Diagnosis present

## 2013-09-20 DIAGNOSIS — G061 Intraspinal abscess and granuloma: Secondary | ICD-10-CM | POA: Diagnosis present

## 2013-09-20 DIAGNOSIS — I129 Hypertensive chronic kidney disease with stage 1 through stage 4 chronic kidney disease, or unspecified chronic kidney disease: Secondary | ICD-10-CM | POA: Diagnosis present

## 2013-09-20 DIAGNOSIS — N189 Chronic kidney disease, unspecified: Secondary | ICD-10-CM | POA: Diagnosis present

## 2013-09-20 DIAGNOSIS — R112 Nausea with vomiting, unspecified: Secondary | ICD-10-CM | POA: Diagnosis present

## 2013-09-20 DIAGNOSIS — S32000D Wedge compression fracture of unspecified lumbar vertebra, subsequent encounter for fracture with routine healing: Secondary | ICD-10-CM

## 2013-09-20 DIAGNOSIS — Z923 Personal history of irradiation: Secondary | ICD-10-CM

## 2013-09-20 DIAGNOSIS — K449 Diaphragmatic hernia without obstruction or gangrene: Secondary | ICD-10-CM | POA: Diagnosis present

## 2013-09-20 DIAGNOSIS — C9000 Multiple myeloma not having achieved remission: Secondary | ICD-10-CM

## 2013-09-20 DIAGNOSIS — Z87311 Personal history of (healed) other pathological fracture: Secondary | ICD-10-CM

## 2013-09-20 DIAGNOSIS — IMO0002 Reserved for concepts with insufficient information to code with codable children: Secondary | ICD-10-CM | POA: Diagnosis present

## 2013-09-20 DIAGNOSIS — A4902 Methicillin resistant Staphylococcus aureus infection, unspecified site: Secondary | ICD-10-CM | POA: Diagnosis present

## 2013-09-20 HISTORY — DX: Multiple myeloma not having achieved remission: C90.00

## 2013-09-20 HISTORY — DX: Monoclonal gammopathy: D47.2

## 2013-09-20 LAB — URINALYSIS, ROUTINE W REFLEX MICROSCOPIC
Bilirubin Urine: NEGATIVE
Glucose, UA: NEGATIVE mg/dL
Hgb urine dipstick: NEGATIVE
Ketones, ur: NEGATIVE mg/dL
Leukocytes, UA: NEGATIVE
Nitrite: NEGATIVE
Protein, ur: NEGATIVE mg/dL
Specific Gravity, Urine: 1.017 (ref 1.005–1.030)
Urobilinogen, UA: 0.2 mg/dL (ref 0.0–1.0)
pH: 6 (ref 5.0–8.0)

## 2013-09-20 LAB — CBC WITH DIFFERENTIAL/PLATELET
Basophils Absolute: 0 10*3/uL (ref 0.0–0.1)
Basophils Relative: 0 % (ref 0–1)
Eosinophils Absolute: 0.1 10*3/uL (ref 0.0–0.7)
Eosinophils Relative: 1 % (ref 0–5)
HCT: 38.6 % (ref 36.0–46.0)
Hemoglobin: 11.7 g/dL — ABNORMAL LOW (ref 12.0–15.0)
Lymphocytes Relative: 11 % — ABNORMAL LOW (ref 12–46)
Lymphs Abs: 1.2 10*3/uL (ref 0.7–4.0)
MCH: 25.6 pg — ABNORMAL LOW (ref 26.0–34.0)
MCHC: 30.3 g/dL (ref 30.0–36.0)
MCV: 84.5 fL (ref 78.0–100.0)
Monocytes Absolute: 0.6 10*3/uL (ref 0.1–1.0)
Monocytes Relative: 6 % (ref 3–12)
Neutro Abs: 8.8 10*3/uL — ABNORMAL HIGH (ref 1.7–7.7)
Neutrophils Relative %: 82 % — ABNORMAL HIGH (ref 43–77)
Platelets: 388 10*3/uL (ref 150–400)
RBC: 4.57 MIL/uL (ref 3.87–5.11)
RDW: 16 % — ABNORMAL HIGH (ref 11.5–15.5)
WBC: 10.8 10*3/uL — ABNORMAL HIGH (ref 4.0–10.5)

## 2013-09-20 LAB — COMPREHENSIVE METABOLIC PANEL
ALT: 16 U/L (ref 0–35)
AST: 16 U/L (ref 0–37)
Albumin: 2.7 g/dL — ABNORMAL LOW (ref 3.5–5.2)
Alkaline Phosphatase: 98 U/L (ref 39–117)
Anion gap: 10 (ref 5–15)
BUN: 15 mg/dL (ref 6–23)
CO2: 25 mEq/L (ref 19–32)
Calcium: 10.3 mg/dL (ref 8.4–10.5)
Chloride: 102 mEq/L (ref 96–112)
Creatinine, Ser: 0.65 mg/dL (ref 0.50–1.10)
GFR calc Af Amer: >90 mL/min (ref >90)
GFR calc non Af Amer: >90 mL/min (ref >90)
Glucose, Bld: 116 mg/dL — ABNORMAL HIGH (ref 70–99)
Potassium: 4.8 mEq/L (ref 3.7–5.3)
Sodium: 137 mEq/L (ref 137–147)
Total Bilirubin: 0.6 mg/dL (ref 0.3–1.2)
Total Protein: 7 g/dL (ref 6.0–8.3)

## 2013-09-20 LAB — I-STAT CG4 LACTIC ACID, ED: Lactic Acid, Venous: 0.91 mmol/L (ref 0.5–2.2)

## 2013-09-20 MED ORDER — SODIUM CHLORIDE 0.9 % IV BOLUS (SEPSIS)
1000.0000 mL | Freq: Once | INTRAVENOUS | Status: AC
  Administered 2013-09-20: 1000 mL via INTRAVENOUS

## 2013-09-20 MED ORDER — ACETAMINOPHEN 325 MG PO TABS
650.0000 mg | ORAL_TABLET | Freq: Four times a day (QID) | ORAL | Status: DC | PRN
  Administered 2013-09-20: 650 mg via ORAL

## 2013-09-20 MED ORDER — VANCOMYCIN HCL IN DEXTROSE 1-5 GM/200ML-% IV SOLN
1000.0000 mg | Freq: Once | INTRAVENOUS | Status: AC
  Administered 2013-09-21: 1000 mg via INTRAVENOUS
  Filled 2013-09-20: qty 200

## 2013-09-20 MED ORDER — ACETAMINOPHEN 650 MG RE SUPP: 650.0000 mg | RECTAL | Status: DC | PRN

## 2013-09-20 MED ORDER — SODIUM CHLORIDE 0.9 % IV BOLUS (SEPSIS): 30.0000 mL/kg | Freq: Once | INTRAVENOUS | Status: DC

## 2013-09-20 MED ORDER — SODIUM CHLORIDE 0.9 % IV SOLN: 1000.0000 mL | INTRAVENOUS | Status: DC

## 2013-09-20 MED ORDER — PIPERACILLIN-TAZOBACTAM 3.375 G IVPB 30 MIN
3.3750 g | Freq: Once | INTRAVENOUS | Status: AC
  Administered 2013-09-21: 3.375 g via INTRAVENOUS
  Filled 2013-09-20: qty 50

## 2013-09-20 NOTE — ED Notes (Signed)
Dr. Belfi at the bedside.  

## 2013-09-20 NOTE — ED Provider Notes (Addendum)
CSN: [id number]     Arrival date & time 09/20/13  2108 History   First MD Initiated Contact with Patient 09/20/13 2205     Chief Complaint  Patient presents with  . Fever  . Leg Pain     (Consider location/radiation/quality/duration/timing/severity/associated sxs/prior Treatment) HPI Comments: Patient presents with back pain and confusion. 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 She's had a total of 10 radiation treatments which she completed about one week ago. She also had surgery on her L3 vertebrae to remove the growth and a vertebral plasty was performed. She was brought in today by her sister because she's had some confusion today she's also noted to have a fever of 101 in triage. She's had some worsening back pain over the last 2-3 days with some pain going down her right leg. She denies any numbness or weakness in her legs. She denies any loss of bowel or bladder function. She's had some nausea and vomiting today. She's had a cough but it seems to be at baseline. She has a chronic cough and is unchanged. She denies any urinary symptoms. She denies any rash or sores.  Patient is a 63 y.o. female presenting with fever and leg pain.  Fever Associated symptoms: confusion, cough, nausea and vomiting   Associated symptoms: no chest pain, no chills, no congestion, no diarrhea, no headaches, no rash and no rhinorrhea   Leg Pain Associated symptoms: back pain and fever   Associated symptoms: no fatigue     Past Medical History  Diagnosis Date  . Diverticulosis   . Hemorrhoids   . Hx of adenomatous colonic polyps   . Hiatal hernia   . Hypertension   . Hyperlipidemia   . GERD (gastroesophageal reflux disease)   . Chronic kidney disease     kidney stone s/p stent placement ( removed)  . Complication of anesthesia   . PONV (postoperative nausea and vomiting)   . Bronchitis, chronic   . Chronic back pain   . Lumbar radicular  pain   . Compression fracture of L3 lumbar vertebra   . T7 vertebral fracture   . Smoldering myeloma    Past Surgical History  Procedure Laterality Date  . Cholecystectomy    . Esophagogastroduodenoscopy  12/18/2011    Procedure: ESOPHAGOGASTRODUODENOSCOPY (EGD);  Surgeon: Lafayette Dragon, MD;  Location: [facility];  Service: Endoscopy;  Laterality: N/A;  . Colonoscopy  12/18/2011    Procedure: COLONOSCOPY;  Surgeon: Lafayette Dragon, MD;  Location: [facility];  Service: Endoscopy;  Laterality: N/A;  . Ureteral stent placement    . Vertebroplasty N/A 07/14/2013    Procedure: VERTEBROPLASTY WITH LUMBAR THREE BIOPSY;  Surgeon: Charlie Pitter, MD;  Location: [facility];  Service: Neurosurgery;  Laterality: N/A;  VERTEBROPLASTY WITH LUMBAR THREE BIOPSY  . Back surgery     Family History  Problem Relation Age of Onset  . Breast cancer Sister 86  . Breast cancer Other 43    aunt   History  Substance Use Topics  . Smoking status: Never Smoker   . Smokeless tobacco: Never Used  . Alcohol Use: Yes     Comment: social occ   OB History   Grav Para Term Preterm Abortions TAB SAB Ect Mult Living                 Review of Systems  Constitutional: Positive for fever. Negative for chills, diaphoresis and fatigue.  HENT: Negative for congestion, rhinorrhea and sneezing.   Eyes: Negative.   Respiratory: Positive for cough. Negative for chest tightness and shortness of breath.   Cardiovascular: Negative for chest pain and leg swelling.  Gastrointestinal: Positive for nausea and vomiting. Negative for abdominal pain, diarrhea and blood in stool.  Genitourinary: Negative for frequency, hematuria, flank pain and difficulty urinating.  Musculoskeletal: Positive for back pain. Negative for arthralgias.  Skin: Negative for rash.  Neurological: Negative for dizziness, speech difficulty, weakness, numbness and headaches.  Psychiatric/Behavioral: Positive for confusion.      Allergies  Morphine  and related and Tetracyclines & related  Home Medications   Prior to Admission medications   Medication Sig Start Date End Date Taking? Authorizing Provider  cyclobenzaprine (FLEXERIL) 5 MG tablet Take 1 tablet (5 mg total) by mouth 3 (three) times daily as needed for muscle spasms. 07/17/13  Yes Thurnell Lose, MD  dexamethasone (DECADRON) 4 MG tablet Take 4 mg by mouth daily.  08/19/13  Yes Farrel Gobble, MD  diazepam (VALIUM) 5 MG tablet Take 2.5 mg by mouth every 8 (eight) hours as needed for anxiety. 09/05/13  Yes Farrel Gobble, MD  feeding supplement (BOOST HIGH PROTEIN) LIQD Take 1 Container by mouth 3 (three) times daily between meals.   Yes Historical Provider, MD  HYDROcodone-acetaminophen (NORCO) 10-325 MG per tablet Take 1 tablet by mouth every 6 (six) hours as needed for moderate pain.   Yes Historical Provider, MD  omeprazole (PRILOSEC) 40 MG capsule Take 40 mg by mouth daily as needed (acid reflux).  08/09/13  Yes Historical Provider, MD  OxyCODONE (OXYCONTIN) 20 mg T12A 12 hr tablet Take 1 tablet (20 mg total) by mouth every 12 (twelve) hours. 09/05/13  Yes Farrel Gobble, MD  potassium chloride SA (K-DUR,KLOR-CON) 20 MEQ tablet Take 1 tablet (20 mEq total) by mouth 2 (two) times daily. 08/20/13  Yes Farrel Gobble, MD   BP 93/38  Pulse 73  Temp(Src) 97.9 F (36.6 C) (Oral)  Resp 12  SpO2 98% Physical Exam  Constitutional: She is oriented to person, place, and time. She appears well-developed and well-nourished.  HENT:  Head: Normocephalic and atraumatic.  Mouth/Throat: Oropharynx is clear and moist.  Eyes: Pupils are equal, round, and reactive to light.  Neck: Normal range of motion. Neck supple.  Cardiovascular: Normal rate, regular rhythm and normal heart sounds.   Pulmonary/Chest: Effort normal and breath sounds normal. No respiratory distress. She has no wheezes. She has no rales. She exhibits no tenderness.  Positive rhonchi bilaterally  Abdominal: Soft.  Bowel sounds are normal. There is no tenderness. There is no rebound and no guarding.  Musculoskeletal: Normal range of motion. She exhibits no edema.  She has tenderness to the L3-L4 area in her lumbar spine as well as the paraspinal area on the right and along the sciatic nerve.  Lymphadenopathy:    She has no cervical adenopathy.  Neurological: She is alert and oriented to person, place, and time.  Patient is alert and orient surroundings but is confused at times to the circumstances. She was actually symmetrically. She has normal motor strength in her lower extremities and normal sensation to light touch in her lower extremities.  Skin: Skin is warm and dry. No rash noted.  Psychiatric: She has a normal mood and affect.    ED Course  Procedures (including critical care time) Labs Review Results for orders placed during the hospital encounter of 09/20/13  CBC WITH DIFFERENTIAL  Result Value Ref Range   WBC 10.8 (*) 4.0 - 10.5 K/uL   RBC 4.57  3.87 - 5.11 MIL/uL   Hemoglobin 11.7 (*) 12.0 - 15.0 g/dL   HCT 38.6  36.0 - 46.0 %   MCV 84.5  78.0 - 100.0 fL   MCH 25.6 (*) 26.0 - 34.0 pg   MCHC 30.3  30.0 - 36.0 g/dL   RDW 16.0 (*) 11.5 - 15.5 %   Platelets 388  150 - 400 K/uL   Neutrophils Relative % 82 (*) 43 - 77 %   Neutro Abs 8.8 (*) 1.7 - 7.7 K/uL   Lymphocytes Relative 11 (*) 12 - 46 %   Lymphs Abs 1.2  0.7 - 4.0 K/uL   Monocytes Relative 6  3 - 12 %   Monocytes Absolute 0.6  0.1 - 1.0 K/uL   Eosinophils Relative 1  0 - 5 %   Eosinophils Absolute 0.1  0.0 - 0.7 K/uL   Basophils Relative 0  0 - 1 %   Basophils Absolute 0.0  0.0 - 0.1 K/uL  COMPREHENSIVE METABOLIC PANEL      Result Value Ref Range   Sodium 137  137 - 147 mEq/L   Potassium 4.8  3.7 - 5.3 mEq/L   Chloride 102  96 - 112 mEq/L   CO2 25  19 - 32 mEq/L   Glucose, Bld 116 (*) 70 - 99 mg/dL   BUN 15  6 - 23 mg/dL   Creatinine, Ser 0.65  0.50 - 1.10 mg/dL   Calcium 10.3  8.4 - 10.5 mg/dL   Total Protein 7.0   6.0 - 8.3 g/dL   Albumin 2.7 (*) 3.5 - 5.2 g/dL   AST 16  0 - 37 U/L   ALT 16  0 - 35 U/L   Alkaline Phosphatase 98  39 - 117 U/L   Total Bilirubin 0.6  0.3 - 1.2 mg/dL   GFR calc non Af Amer >90  >90 mL/min   GFR calc Af Amer >90  >90 mL/min   Anion gap 10  5 - 15  URINALYSIS, ROUTINE W REFLEX MICROSCOPIC      Result Value Ref Range   Color, Urine YELLOW  YELLOW   APPearance CLOUDY (*) CLEAR   Specific Gravity, Urine 1.017  1.005 - 1.030   pH 6.0  5.0 - 8.0   Glucose, UA NEGATIVE  NEGATIVE mg/dL   Hgb urine dipstick NEGATIVE  NEGATIVE   Bilirubin Urine NEGATIVE  NEGATIVE   Ketones, ur NEGATIVE  NEGATIVE mg/dL   Protein, ur NEGATIVE  NEGATIVE mg/dL   Urobilinogen, UA 0.2  0.0 - 1.0 mg/dL   Nitrite NEGATIVE  NEGATIVE   Leukocytes, UA NEGATIVE  NEGATIVE  I-STAT CG4 LACTIC ACID, ED      Result Value Ref Range   Lactic Acid, Venous 0.91  0.5 - 2.2 mmol/L   Dg Lumbar Spine Complete  09/19/2013   CLINICAL DATA:  Back pain.  EXAM: LUMBAR SPINE - COMPLETE 4+ VIEW  COMPARISON:  CT 08/26/2013.  FINDINGS: Methylmethacrylate in the L3 vertebral body. Lumbar vertebrae are numbered with the lowest segmented appearing vertebrae on lateral view as L5. No acute abnormality. Diffuse osteopenia degenerative change. Surgical clips right upper quadrant.  IMPRESSION: 1. No acute abnormality. 2. Prior vertebroplasty L3.   Electronically Signed   By: Marcello Moores  Register   On: 09/19/2013 15:25   Dg Chest Port 1 View  (if Code Sepsis Called)  09/20/2013   CLINICAL  DATA:  Fever and leg pain.  Shortness of breath.  EXAM: PORTABLE CHEST - 1 VIEW  COMPARISON:  Chest radiograph performed 12/27/2012, and CT of the chest performed 07/12/2013  FINDINGS: The lungs are mildly hypoexpanded. Mild bibasilar opacities may reflect atelectasis or possibly mild pneumonia, given the patient's symptoms. There is no evidence of pleural effusion or pneumothorax.  The cardiomediastinal silhouette is borderline normal in size. No acute  osseous abnormalities are seen. A large hiatal hernia is again seen.  IMPRESSION: 1. Lungs mildly hypoexpanded. Mild bibasilar airspace opacities may reflect atelectasis or possibly mild pneumonia, given the patient's symptoms. 2. Large hiatal hernia again noted.   Electronically Signed   By: Garald Balding M.D.   On: 09/20/2013 22:54      Imaging Review Dg Lumbar Spine Complete  09/19/2013   CLINICAL DATA:  Back pain.  EXAM: LUMBAR SPINE - COMPLETE 4+ VIEW  COMPARISON:  CT 08/26/2013.  FINDINGS: Methylmethacrylate in the L3 vertebral body. Lumbar vertebrae are numbered with the lowest segmented appearing vertebrae on lateral view as L5. No acute abnormality. Diffuse osteopenia degenerative change. Surgical clips right upper quadrant.  IMPRESSION: 1. No acute abnormality. 2. Prior vertebroplasty L3.   Electronically Signed   By: Marcello Moores  Register   On: 09/19/2013 15:25   Dg Chest Port 1 View  (if Code Sepsis Called)  09/20/2013   CLINICAL DATA:  Fever and leg pain.  Shortness of breath.  EXAM: PORTABLE CHEST - 1 VIEW  COMPARISON:  Chest radiograph performed 12/27/2012, and CT of the chest performed 07/12/2013  FINDINGS: The lungs are mildly hypoexpanded. Mild bibasilar opacities may reflect atelectasis or possibly mild pneumonia, given the patient's symptoms. There is no evidence of pleural effusion or pneumothorax.  The cardiomediastinal silhouette is borderline normal in size. No acute osseous abnormalities are seen. A large hiatal hernia is again seen.  IMPRESSION: 1. Lungs mildly hypoexpanded. Mild bibasilar airspace opacities may reflect atelectasis or possibly mild pneumonia, given the patient's symptoms. 2. Large hiatal hernia again noted.   Electronically Signed   By: Garald Balding M.D.   On: 09/20/2013 22:54     EKG Interpretation None      MDM   Final diagnoses:  Sepsis associated hypotension  Healthcare-associated pneumonia    Patient presents with fever and sepsis. Sepsis  protocol was started and she was given IV fluids.  She did have one episode of hypotension with a blood pressure in the 18E systolic however this responded to IV fluids and she's maintaining systolic blood pressures over 100 since that time. Her urine does not appear infected. Her chest x-ray shows a questionable pneumonia. I do feel that with her recent procedures and worsening back pain, we should check an MRI of her lumbar spine to rule out an epidural abscess or infection. This is currently pending. I did give her broad-spectrum antibiotics including vancomycin and Zosyn.  00:14: was notified that BP back down in 90s.  Will give 2nd liter bolus.  Lactate normal.  00:31: spoke with Dr. Earnest Conroy with PCCM, advised that given pt is not requiring pressors as of yet, can admit to primary care service.  They are available as needed.  Will consult unassigned physican.  Pt's PMD is in [place], [place].  9937:  BP 99 systolic.  Spoke with family medicine who will admit pt.  Dr. Reather Converse to keep an eye on pt until they admit her.  CRITICAL CARE Performed by: Siddarth Hsiung Total critical care time: 70 Critical care  time was exclusive of separately billable procedures and treating other patients. Critical care was necessary to treat or prevent imminent or life-threatening deterioration. Critical care was time spent personally by me on the following activities: development of treatment plan with patient and/or surrogate as well as nursing, discussions with consultants, evaluation of patient's response to treatment, examination of patient, obtaining history from patient or surrogate, ordering and performing treatments and interventions, ordering and review of laboratory studies, ordering and review of radiographic studies, pulse oximetry and re-evaluation of patient's condition.   Malvin Johns, MD 09/21/13 [id number]  Malvin Johns, MD 09/21/13 0040

## 2013-09-20 NOTE — ED Notes (Signed)
Presents with left leg pain uncontrolled by medications at home. PT is febrile here-confused at times-unable to walk due to pain-HR 110, temp 101.4-sats 93% RA, bilateral breath sounds diminished. Family reports coughing up blood, nausea and urinary frequency. Finished radiation on lumbar spine one week ago and had back surgery for smoldering myeloma 2 weeks ago. Surgeon is Dr. Trenton Gammon.

## 2013-09-20 NOTE — ED Notes (Addendum)
Discussed plan of care with Dr. Tamera Punt, will in and out cath for urine sample.

## 2013-09-20 NOTE — ED Notes (Signed)
Patient placed on 2L [place] due to SpO2 of 89% on RA. SpO2 now 97%

## 2013-09-20 NOTE — ED Notes (Signed)
X-ray at bedside

## 2013-09-20 NOTE — ED Notes (Signed)
Dr. Tamera Punt notified of PO Temp of 101.8

## 2013-09-21 ENCOUNTER — Emergency Department (HOSPITAL_COMMUNITY): Payer: BC Managed Care – PPO

## 2013-09-21 DIAGNOSIS — M519 Unspecified thoracic, thoracolumbar and lumbosacral intervertebral disc disorder: Secondary | ICD-10-CM

## 2013-09-21 DIAGNOSIS — T148XXA Other injury of unspecified body region, initial encounter: Secondary | ICD-10-CM | POA: Diagnosis not present

## 2013-09-21 DIAGNOSIS — G061 Intraspinal abscess and granuloma: Secondary | ICD-10-CM

## 2013-09-21 DIAGNOSIS — M869 Osteomyelitis, unspecified: Secondary | ICD-10-CM

## 2013-09-21 DIAGNOSIS — A419 Sepsis, unspecified organism: Secondary | ICD-10-CM | POA: Diagnosis not present

## 2013-09-21 DIAGNOSIS — IMO0002 Reserved for concepts with insufficient information to code with codable children: Secondary | ICD-10-CM | POA: Diagnosis not present

## 2013-09-21 DIAGNOSIS — C9000 Multiple myeloma not having achieved remission: Secondary | ICD-10-CM | POA: Diagnosis not present

## 2013-09-21 LAB — CREATININE, SERUM
Creatinine, Ser: 0.52 mg/dL (ref 0.50–1.10)
GFR calc Af Amer: >90 mL/min (ref >90)
GFR calc non Af Amer: >90 mL/min (ref >90)

## 2013-09-21 LAB — SEDIMENTATION RATE: Sed Rate: 7 mm/hr (ref 0–22)

## 2013-09-21 LAB — GLUCOSE, CAPILLARY: Glucose-Capillary: 70 mg/dL (ref 70–99)

## 2013-09-21 LAB — CBC
HCT: 32.2 % — ABNORMAL LOW (ref 36.0–46.0)
Hemoglobin: 9.7 g/dL — ABNORMAL LOW (ref 12.0–15.0)
MCH: 26.1 pg (ref 26.0–34.0)
MCHC: 30.1 g/dL (ref 30.0–36.0)
MCV: 86.6 fL (ref 78.0–100.0)
Platelets: 283 10*3/uL (ref 150–400)
RBC: 3.72 MIL/uL — ABNORMAL LOW (ref 3.87–5.11)
RDW: 15.9 % — ABNORMAL HIGH (ref 11.5–15.5)
WBC: 6.8 10*3/uL (ref 4.0–10.5)

## 2013-09-21 LAB — HIV ANTIBODY (ROUTINE TESTING W REFLEX): HIV 1&2 Ab, 4th Generation: NONREACTIVE

## 2013-09-21 LAB — C-REACTIVE PROTEIN: CRP: 20.9 mg/dL — ABNORMAL HIGH (ref <0.60)

## 2013-09-21 LAB — MRSA PCR SCREENING: MRSA by PCR: POSITIVE — AB

## 2013-09-21 MED ORDER — ONDANSETRON HCL 4 MG PO TABS: 4.0000 mg | ORAL_TABLET | Freq: Four times a day (QID) | ORAL | Status: DC | PRN

## 2013-09-21 MED ORDER — CHLORHEXIDINE GLUCONATE CLOTH 2 % EX PADS
6.0000 | MEDICATED_PAD | Freq: Every day | CUTANEOUS | Status: AC
  Administered 2013-09-21 – 2013-09-25 (×4): 6 via TOPICAL

## 2013-09-21 MED ORDER — ONDANSETRON HCL 4 MG/2ML IJ SOLN
4.0000 mg | Freq: Four times a day (QID) | INTRAMUSCULAR | Status: DC | PRN
  Administered 2013-09-23: 4 mg via INTRAVENOUS
  Filled 2013-09-21 (×2): qty 2

## 2013-09-21 MED ORDER — DIAZEPAM 5 MG PO TABS
2.5000 mg | ORAL_TABLET | Freq: Three times a day (TID) | ORAL | Status: DC | PRN
  Administered 2013-09-25: 2.5 mg via ORAL
  Filled 2013-09-21: qty 1

## 2013-09-21 MED ORDER — SODIUM CHLORIDE 0.9 % IV BOLUS (SEPSIS)
1000.0000 mL | Freq: Once | INTRAVENOUS | Status: AC
  Administered 2013-09-21: 1000 mL via INTRAVENOUS

## 2013-09-21 MED ORDER — SODIUM CHLORIDE 0.9 % IV SOLN
INTRAVENOUS | Status: DC
  Administered 2013-09-21: 06:00:00 via INTRAVENOUS

## 2013-09-21 MED ORDER — DEXAMETHASONE 4 MG PO TABS
4.0000 mg | ORAL_TABLET | Freq: Every day | ORAL | Status: DC
  Administered 2013-09-21 – 2013-09-26 (×6): 4 mg via ORAL
  Filled 2013-09-21 (×6): qty 1

## 2013-09-21 MED ORDER — ACETAMINOPHEN 650 MG RE SUPP: 650.0000 mg | Freq: Four times a day (QID) | RECTAL | Status: DC | PRN

## 2013-09-21 MED ORDER — DEXTROSE 5 % IV SOLN
2.0000 g | Freq: Two times a day (BID) | INTRAVENOUS | Status: DC
  Administered 2013-09-21 – 2013-09-23 (×5): 2 g via INTRAVENOUS
  Filled 2013-09-21 (×6): qty 2

## 2013-09-21 MED ORDER — VANCOMYCIN HCL IN DEXTROSE 1-5 GM/200ML-% IV SOLN
1000.0000 mg | Freq: Two times a day (BID) | INTRAVENOUS | Status: DC
  Administered 2013-09-21 – 2013-09-23 (×6): 1000 mg via INTRAVENOUS
  Filled 2013-09-21 (×7): qty 200

## 2013-09-21 MED ORDER — HYDROMORPHONE HCL PF 1 MG/ML IJ SOLN
1.0000 mg | INTRAMUSCULAR | Status: DC | PRN
  Administered 2013-09-22 – 2013-09-24 (×8): 1 mg via INTRAVENOUS
  Filled 2013-09-21 (×9): qty 1

## 2013-09-21 MED ORDER — PIPERACILLIN-TAZOBACTAM 3.375 G IVPB
3.3750 g | Freq: Three times a day (TID) | INTRAVENOUS | Status: DC
  Administered 2013-09-21: 3.375 g via INTRAVENOUS
  Filled 2013-09-21 (×3): qty 50

## 2013-09-21 MED ORDER — GADOBENATE DIMEGLUMINE 529 MG/ML IV SOLN
15.0000 mL | Freq: Once | INTRAVENOUS | Status: AC
  Administered 2013-09-21: 15 mL via INTRAVENOUS

## 2013-09-21 MED ORDER — MUPIROCIN 2 % EX OINT
1.0000 "application " | TOPICAL_OINTMENT | Freq: Two times a day (BID) | CUTANEOUS | Status: AC
  Administered 2013-09-21 – 2013-09-25 (×10): 1 via NASAL
  Filled 2013-09-21 (×2): qty 22

## 2013-09-21 MED ORDER — BIOTENE DRY MOUTH MT LIQD
15.0000 mL | Freq: Two times a day (BID) | OROMUCOSAL | Status: DC
  Administered 2013-09-21 – 2013-09-26 (×10): 15 mL via OROMUCOSAL

## 2013-09-21 MED ORDER — SODIUM CHLORIDE 0.9 % IJ SOLN
3.0000 mL | Freq: Two times a day (BID) | INTRAMUSCULAR | Status: DC
  Administered 2013-09-21 – 2013-09-24 (×7): 3 mL via INTRAVENOUS

## 2013-09-21 MED ORDER — BOOST HIGH PROTEIN PO LIQD
1.0000 | Freq: Three times a day (TID) | ORAL | Status: DC
  Administered 2013-09-21 – 2013-09-25 (×9): 1 via ORAL
  Filled 2013-09-21 (×20): qty 1

## 2013-09-21 MED ORDER — ENOXAPARIN SODIUM 40 MG/0.4ML ~~LOC~~ SOLN
40.0000 mg | SUBCUTANEOUS | Status: DC
  Administered 2013-09-21 – 2013-09-22 (×2): 40 mg via SUBCUTANEOUS
  Filled 2013-09-21 (×2): qty 0.4

## 2013-09-21 MED ORDER — ACETAMINOPHEN 325 MG PO TABS
650.0000 mg | ORAL_TABLET | Freq: Four times a day (QID) | ORAL | Status: DC | PRN
  Administered 2013-09-21 (×2): 650 mg via ORAL
  Filled 2013-09-21 (×2): qty 2

## 2013-09-21 NOTE — Consult Note (Signed)
CC:  Chief Complaint  Patient presents with  . Fever  . Leg Pain    HPI: Autumn Duran is a 63 y.o. female admitted early this am with primary complaint above. She states the pain started a few weeks ago and was seen in the outpatient neurosurgery clinic by Dr. Annette Stable last week and MRI L spine was already ordered. Her pain has continued to worsen, especially while up. She describes sharp pain primarily along the left hip, lat thigh, lat calf. No pain, N/T in the foot. She does not have any bladder symptoms. Upon admission today she was found to be febrile with marginally elevated HR and marginal hypotension.  Of note, the patient underwent L3 biopsy/kyphoplasty in May and was found to have myeloma. She has completed fractionated spinal radiation 2 weeks ago.  PMH: Past Medical History  Diagnosis Date  . Diverticulosis   . Hemorrhoids   . Hx of adenomatous colonic polyps   . Hiatal hernia   . Hypertension   . Hyperlipidemia   . GERD (gastroesophageal reflux disease)   . Chronic kidney disease     kidney stone s/p stent placement ( removed)  . Complication of anesthesia   . PONV (postoperative nausea and vomiting)   . Bronchitis, chronic   . Chronic back pain   . Lumbar radicular pain   . Compression fracture of L3 lumbar vertebra   . T7 vertebral fracture   . Smoldering myeloma     PSH: Past Surgical History  Procedure Laterality Date  . Cholecystectomy    . Esophagogastroduodenoscopy  12/18/2011    Procedure: ESOPHAGOGASTRODUODENOSCOPY (EGD);  Surgeon: Lafayette Dragon, MD;  Location: [facility];  Service: Endoscopy;  Laterality: N/A;  . Colonoscopy  12/18/2011    Procedure: COLONOSCOPY;  Surgeon: Lafayette Dragon, MD;  Location: [facility];  Service: Endoscopy;  Laterality: N/A;  . Ureteral stent placement    . Vertebroplasty N/A 07/14/2013    Procedure: VERTEBROPLASTY WITH LUMBAR THREE BIOPSY;  Surgeon: Charlie Pitter, MD;  Location: [facility];  Service: Neurosurgery;   Laterality: N/A;  VERTEBROPLASTY WITH LUMBAR THREE BIOPSY  . Back surgery      SH: History  Substance Use Topics  . Smoking status: Never Smoker   . Smokeless tobacco: Never Used  . Alcohol Use: Yes     Comment: social occ    MEDS: Prior to Admission medications   Medication Sig Start Date End Date Taking? Authorizing Provider  cyclobenzaprine (FLEXERIL) 5 MG tablet Take 1 tablet (5 mg total) by mouth 3 (three) times daily as needed for muscle spasms. 07/17/13  Yes Thurnell Lose, MD  dexamethasone (DECADRON) 4 MG tablet Take 4 mg by mouth daily.  08/19/13  Yes Farrel Gobble, MD  diazepam (VALIUM) 5 MG tablet Take 2.5 mg by mouth every 8 (eight) hours as needed for anxiety. 09/05/13  Yes Farrel Gobble, MD  feeding supplement (BOOST HIGH PROTEIN) LIQD Take 1 Container by mouth 3 (three) times daily between meals.   Yes Historical Provider, MD  HYDROcodone-acetaminophen (NORCO) 10-325 MG per tablet Take 1 tablet by mouth every 6 (six) hours as needed for moderate pain.   Yes Historical Provider, MD  omeprazole (PRILOSEC) 40 MG capsule Take 40 mg by mouth daily as needed (acid reflux).  08/09/13  Yes Historical Provider, MD  OxyCODONE (OXYCONTIN) 20 mg T12A 12 hr tablet Take 1 tablet (20 mg total) by mouth every 12 (twelve) hours. 09/05/13  Yes Farrel Gobble, MD  potassium  chloride SA (K-DUR,KLOR-CON) 20 MEQ tablet Take 1 tablet (20 mEq total) by mouth 2 (two) times daily. 08/20/13  Yes Farrel Gobble, MD    ALLERGY: Allergies  Allergen Reactions  . Morphine And Related Other (See Comments)    Headache  . Tetracyclines & Related Nausea And Vomiting    ROS: ROS  NEUROLOGIC EXAM: Awake, alert, oriented Memory and concentration grossly intact Speech fluent, appropriate CN grossly intact Motor exam: Upper Extremities Deltoid Bicep Tricep Grip  Right 5/5 5/5 5/5 5/5  Left 5/5 5/5 5/5 5/5   Lower Extremity IP Quad PF DF EHL  Right 5/5 5/5 5/5 5/5 4/5  Left 5/5 5/5 5/5  5/5 5/5   Sensation grossly intact to LT  IMGAING: MRI L spine w/w/o Gad reviewed demonstrating IR change at the L2, L3, and L4 bodies. No significant disc edema is seen at these levels. Cement is seen in the body of L3. No epidural mass is seen at these levels. L4-5 is normal. L5-S1 shows marrow signal change in the left pedicle/facet complex with a peripherally enhancing lesion in the left L5 lateral recess.  LABS: WBC 10.8 -> 6.8 (was 11.8 on 09/05/13) BMP normal  IMPRESSION: - 63 y.o. female with clinical left L5 radiculopathy.  - While MRI findings are suspicious for spinal epidural abscess, this would be very unlikely post-percutaneous kyphoplasty. This could possibly represent acute disc herniation also.  - Does not appear to be clinically septic. While she was febrile upon admission, she had minimal tachycardia (~100 bpm) and minimal hyptension (SBP ~171mHg) with minimally elevated WBC and aside from pain, no other sx.  PLAN: - Check peripheral blood cultures x 2 although pt already started on Zosyn - Check CRP/ESR

## 2013-09-21 NOTE — ED Notes (Signed)
Patient going to [facility] room 11during report with Lucious Groves, receiving RN.

## 2013-09-21 NOTE — ED Notes (Signed)
Called Radiology in regards to MRI that is planned.  Team will have radiologist call Dr. Tamera Punt.

## 2013-09-21 NOTE — ED Notes (Signed)
Patient frequently attempts to void with no results. Bladder scan showed 3mL retained. Preparing for MRI transport.

## 2013-09-21 NOTE — ED Notes (Signed)
Spoke to Dr. Tamera Punt in regards to patient's blood pressure trending down, from 109/58 after 1 Liter bolus, to 92/44.  MD orders to change maintenance dose to a 2nd liter bolus, then start maintenance dose of NS at 141mL/hr.

## 2013-09-21 NOTE — ED Notes (Signed)
Dr. Lacinda Axon, [facility], at the bedside.

## 2013-09-21 NOTE — ED Notes (Signed)
Reported BP 93/48 to Dr. Reather Converse with 2nd Liter almost finished infusing. Will administer 3rd liter per discussion and verbal order.

## 2013-09-21 NOTE — H&P (Signed)
FMTS Attending Admission Note: Autumn Sabal MD Personal pager:  [phone] FPTS Service Pager:  [phone]  I  have seen and examined this patient, reviewed their chart. I have discussed this patient with the resident. I agree with the resident's findings, assessment and care plan.  Additionally:  63 yo F with increasing R leg pain and lower back pain for past 2 days.  History of multiple myeloma recently diagnosed and being treated via radiation.  Also history of recent (May 2015) kyphoplasty and biopsy after pathologic L3 fracture.  Biopsy showed plasmacytoma.  Found on MRI of back to have discitis with overlying epidural abscess.  Admitted to [facility] for IV abx and possible drainage.  Today, she is awake and alert.  Pain is under control.  Heart RRR, clear lungs.  Some TTP directly over lumbar spinous processes.  No radiation to legs.  Strength/sensation 5/5 Bl LE's.    Imp/Plan: 1) Epidural Abscess/discitis: - initially septic, now with resolution of SIRS criteria - Appreciate neurosurg and ID rec's - Agree with drainage.  Consult IR - Cont Vanc/Cefepime - Has had some low BP's today.  Watch pressures.  Has not required pressors   2) Myeloma: - continue with oncology - no evidence of tumor lysis  Alveda Reasons, MD 09/21/2013

## 2013-09-21 NOTE — Consult Note (Signed)
[facility] for Infectious Disease  Date of Admission:  09/20/2013  Date of Consult:  09/21/2013  Reason for Consult: Osteomyelitis, Epidural Abscess Referring Physician: Lacinda Axon  Impression/Recommendation L3-5 epidural Abscess, Osteomyelitis  Appreciate neurosurgery eval IR to eval for aspirate if not drained by surgery? Await BCx Change zosyn to cefepime Check HIV  Comment Given her steroids and myeloma, she is at risk for encapsulated organisms (strep, haemophilus).  She is also at risk for pseudomonas. Anaerobes would seem less likely so her anbx can be changed from zosyn to cefepime.   Thank you so much for this truly interesting consult,   Bobby Rumpf (pager) [phone] [email or web address]  Autumn Duran is an 63 y.o. female.  HPI: 63 yo F with hx myeloma (dx in 06-2013, has been receiving XRT, on decadron at home), and of worsening back and hip pain over 48 hrs. This progressed to the point that she was no longer able to ambulate. She denies f/c.  Her hx is also notable for recent back surgery/vertebroplasty with bx at L3 on 07-14-13.  In ED she had temp 101.8, MRI showing  1. Findings concerning for osteomyelitis discitis at L2-3 and L3-4. 2. Abnormal edema and enhancement within the left pedicle of L5 and about the left L5-S1 joint, also concerning for infection/septic arthritis. 3. 1.1 x 1.1 x 1.5 cm loculated collection within the left ventral epidural space posterior to L5, concerning for possible epidural abscess. There is moderate central canal narrowing at this level.  Due to concerns about hypoxia, hypotension, she was admitted to ICU.  She was started on vanco/zosyn. she does not have a port or hickman.   Past Medical History  Diagnosis Date  . Diverticulosis   . Hemorrhoids   . Hx of adenomatous colonic polyps   . Hiatal hernia   . Hypertension   . Hyperlipidemia   . GERD (gastroesophageal reflux disease)   . Chronic kidney disease     kidney  stone s/p stent placement ( removed)  . Complication of anesthesia   . PONV (postoperative nausea and vomiting)   . Bronchitis, chronic   . Chronic back pain   . Lumbar radicular pain   . Compression fracture of L3 lumbar vertebra   . T7 vertebral fracture   . Smoldering myeloma     Past Surgical History  Procedure Laterality Date  . Cholecystectomy    . Esophagogastroduodenoscopy  12/18/2011    Procedure: ESOPHAGOGASTRODUODENOSCOPY (EGD);  Surgeon: Lafayette Dragon, MD;  Location: [facility];  Service: Endoscopy;  Laterality: N/A;  . Colonoscopy  12/18/2011    Procedure: COLONOSCOPY;  Surgeon: Lafayette Dragon, MD;  Location: [facility];  Service: Endoscopy;  Laterality: N/A;  . Ureteral stent placement    . Vertebroplasty N/A 07/14/2013    Procedure: VERTEBROPLASTY WITH LUMBAR THREE BIOPSY;  Surgeon: Charlie Pitter, MD;  Location: [facility];  Service: Neurosurgery;  Laterality: N/A;  VERTEBROPLASTY WITH LUMBAR THREE BIOPSY  . Back surgery       Allergies  Allergen Reactions  . Morphine And Related Other (See Comments)    Headache  . Tetracyclines & Related Nausea And Vomiting    Medications:  Scheduled: . antiseptic oral rinse  15 mL Mouth Rinse BID  . Chlorhexidine Gluconate Cloth  6 each Topical Q0600  . dexamethasone  4 mg Oral Daily  . enoxaparin (LOVENOX) injection  40 mg Subcutaneous Q24H  . feeding supplement  1 Container Oral TID BM  . mupirocin ointment  1 application Nasal BID  . piperacillin-tazobactam (ZOSYN)  IV  3.375 g Intravenous 3 times per day  . sodium chloride  3 mL Intravenous Q12H  . vancomycin  1,000 mg Intravenous Q12H    Abtx:  Anti-infectives   Start     Dose/Rate Route Frequency Ordered Stop   09/21/13 0800  vancomycin (VANCOCIN) IVPB 1000 mg/200 mL premix     1,000 mg 200 mL/hr over 60 Minutes Intravenous Every 12 hours 09/21/13 0427     09/21/13 0600  piperacillin-tazobactam (ZOSYN) IVPB 3.375 g     3.375 g 12.5 mL/hr over 240 Minutes  Intravenous 3 times per day 09/21/13 0427     09/20/13 2330  piperacillin-tazobactam (ZOSYN) IVPB 3.375 g     3.375 g 100 mL/hr over 30 Minutes Intravenous  Once 09/20/13 2315 09/21/13 0030   09/20/13 2330  vancomycin (VANCOCIN) IVPB 1000 mg/200 mL premix     1,000 mg 200 mL/hr over 60 Minutes Intravenous  Once 09/20/13 2315 09/21/13 0130      Total days of antibiotics: 1 vanco/zosyn          Social History:  reports that she has never smoked. She has never used smokeless tobacco. She reports that she drinks alcohol. She reports that she does not use illicit drugs.  Family History  Problem Relation Age of Onset  . Breast cancer Sister 50  . Breast cancer Other 44    aunt    General ROS: normal BM, urination. Has had numbness in R leg/thigh. pain improved. see HPI.   Blood pressure 98/40, pulse 67, temperature 98.4 F (36.9 C), temperature source Oral, resp. rate 15, height $RemoveBe'5\' 1"'eiAsjbvdz$  (1.549 m), weight 79.3 kg (174 lb 13.2 oz), SpO2 92.00%. General appearance: alert, cooperative and no distress Eyes: negative findings: conjunctivae and sclerae normal and pupils equal, round, reactive to light and accomodation Throat: lips, mucosa, and tongue normal; teeth and gums normal Neck: no adenopathy and supple, symmetrical, trachea midline Lungs: clear to auscultation bilaterally Heart: regular rate and rhythm Abdomen: normal findings: bowel sounds normal and soft, non-tender Extremities: edema none Neuro- normal light touch in LE, plantar strength is 5/5.    Results for orders placed during the hospital encounter of 09/20/13 (from the past 48 hour(s))  CBC WITH DIFFERENTIAL     Status: Abnormal   Collection Time    09/20/13 10:00 PM      Result Value Ref Range   WBC 10.8 (*) 4.0 - 10.5 K/uL   RBC 4.57  3.87 - 5.11 MIL/uL   Hemoglobin 11.7 (*) 12.0 - 15.0 g/dL   HCT 38.6  36.0 - 46.0 %   MCV 84.5  78.0 - 100.0 fL   MCH 25.6 (*) 26.0 - 34.0 pg   MCHC 30.3  30.0 - 36.0 g/dL   RDW 16.0  (*) 11.5 - 15.5 %   Platelets 388  150 - 400 K/uL   Neutrophils Relative % 82 (*) 43 - 77 %   Neutro Abs 8.8 (*) 1.7 - 7.7 K/uL   Lymphocytes Relative 11 (*) 12 - 46 %   Lymphs Abs 1.2  0.7 - 4.0 K/uL   Monocytes Relative 6  3 - 12 %   Monocytes Absolute 0.6  0.1 - 1.0 K/uL   Eosinophils Relative 1  0 - 5 %   Eosinophils Absolute 0.1  0.0 - 0.7 K/uL   Basophils Relative 0  0 - 1 %   Basophils Absolute 0.0  0.0 - 0.1  K/uL  COMPREHENSIVE METABOLIC PANEL     Status: Abnormal   Collection Time    09/20/13 10:00 PM      Result Value Ref Range   Sodium 137  137 - 147 mEq/L   Potassium 4.8  3.7 - 5.3 mEq/L   Chloride 102  96 - 112 mEq/L   CO2 25  19 - 32 mEq/L   Glucose, Bld 116 (*) 70 - 99 mg/dL   BUN 15  6 - 23 mg/dL   Creatinine, Ser 0.65  0.50 - 1.10 mg/dL   Calcium 10.3  8.4 - 10.5 mg/dL   Total Protein 7.0  6.0 - 8.3 g/dL   Albumin 2.7 (*) 3.5 - 5.2 g/dL   AST 16  0 - 37 U/L   ALT 16  0 - 35 U/L   Alkaline Phosphatase 98  39 - 117 U/L   Total Bilirubin 0.6  0.3 - 1.2 mg/dL   GFR calc non Af Amer >90  >90 mL/min   GFR calc Af Amer >90  >90 mL/min   Comment: (NOTE)     The eGFR has been calculated using the CKD EPI equation.     This calculation has not been validated in all clinical situations.     eGFR's persistently <90 mL/min signify possible Chronic Kidney     Disease.   Anion gap 10  5 - 15  I-STAT CG4 LACTIC ACID, ED     Status: None   Collection Time    09/20/13 10:10 PM      Result Value Ref Range   Lactic Acid, Venous 0.91  0.5 - 2.2 mmol/L  URINALYSIS, ROUTINE W REFLEX MICROSCOPIC     Status: Abnormal   Collection Time    09/20/13 11:34 PM      Result Value Ref Range   Color, Urine YELLOW  YELLOW   APPearance CLOUDY (*) CLEAR   Specific Gravity, Urine 1.017  1.005 - 1.030   pH 6.0  5.0 - 8.0   Glucose, UA NEGATIVE  NEGATIVE mg/dL   Hgb urine dipstick NEGATIVE  NEGATIVE   Bilirubin Urine NEGATIVE  NEGATIVE   Ketones, ur NEGATIVE  NEGATIVE mg/dL   Protein,  ur NEGATIVE  NEGATIVE mg/dL   Urobilinogen, UA 0.2  0.0 - 1.0 mg/dL   Nitrite NEGATIVE  NEGATIVE   Leukocytes, UA NEGATIVE  NEGATIVE   Comment: MICROSCOPIC NOT DONE ON URINES WITH NEGATIVE PROTEIN, BLOOD, LEUKOCYTES, NITRITE, OR GLUCOSE <1000 mg/dL.  MRSA PCR SCREENING     Status: Abnormal   Collection Time    09/21/13  3:36 AM      Result Value Ref Range   MRSA by PCR POSITIVE (*) NEGATIVE   Comment:            The GeneXpert MRSA Assay (FDA     approved for NASAL specimens     only), is one component of a     comprehensive MRSA colonization     surveillance program. It is not     intended to diagnose MRSA     infection nor to guide or     monitor treatment for     MRSA infections.     RESULT CALLED TO, READ BACK BY AND VERIFIED WITH:     CALLED TO RN [facility] [id number] $[email or web address]$   GLUCOSE, CAPILLARY     Status: None   Collection Time    09/21/13  3:56 AM      Result Value Ref Range  Glucose-Capillary 70  70 - 99 mg/dL  CBC     Status: Abnormal   Collection Time    09/21/13  6:50 AM      Result Value Ref Range   WBC 6.8  4.0 - 10.5 K/uL   RBC 3.72 (*) 3.87 - 5.11 MIL/uL   Hemoglobin 9.7 (*) 12.0 - 15.0 g/dL   Comment: DELTA CHECK NOTED     REPEATED TO VERIFY   HCT 32.2 (*) 36.0 - 46.0 %   MCV 86.6  78.0 - 100.0 fL   MCH 26.1  26.0 - 34.0 pg   MCHC 30.1  30.0 - 36.0 g/dL   RDW 15.9 (*) 11.5 - 15.5 %   Platelets 283  150 - 400 K/uL   Comment: DELTA CHECK NOTED     REPEATED TO VERIFY  CREATININE, SERUM     Status: None   Collection Time    09/21/13  6:50 AM      Result Value Ref Range   Creatinine, Ser 0.52  0.50 - 1.10 mg/dL   GFR calc non Af Amer >90  >90 mL/min   GFR calc Af Amer >90  >90 mL/min   Comment: (NOTE)     The eGFR has been calculated using the CKD EPI equation.     This calculation has not been validated in all clinical situations.     eGFR's persistently <90 mL/min signify possible Chronic Kidney     Disease.  SEDIMENTATION RATE     Status: None    Collection Time    09/21/13 11:00 AM      Result Value Ref Range   Sed Rate 7  0 - 22 mm/hr   No results found for this basename: sdes, specrequest, cult, reptstatus   Dg Lumbar Spine Complete  09/19/2013   CLINICAL DATA:  Back pain.  EXAM: LUMBAR SPINE - COMPLETE 4+ VIEW  COMPARISON:  CT 08/26/2013.  FINDINGS: Methylmethacrylate in the L3 vertebral body. Lumbar vertebrae are numbered with the lowest segmented appearing vertebrae on lateral view as L5. No acute abnormality. Diffuse osteopenia degenerative change. Surgical clips right upper quadrant.  IMPRESSION: 1. No acute abnormality. 2. Prior vertebroplasty L3.   Electronically Signed   By: Marcello Moores  Register   On: 09/19/2013 15:25   Mr Lumbar Spine W Wo Contrast  09/21/2013   CLINICAL DATA:  back pain, fever  EXAM: MRI LUMBAR SPINE WITHOUT AND WITH CONTRAST  TECHNIQUE: Multiplanar and multiecho pulse sequences of the lumbar spine were obtained without and with intravenous contrast.  CONTRAST:  41mL MULTIHANCE GADOBENATE DIMEGLUMINE 529 MG/ML IV SOLN  COMPARISON:  Prior radiograph from earlier the same day as well as previous MRI from 07/04/2013.  FINDINGS: For the purposes of this dictation, the lowest well-formed intervertebral disc spaces presumed to be the L5-S1 level, and there presumed to be 5 lumbar type vertebral bodies.  5 mm of anterolisthesis of L5 on S1 is stable from prior. Vertebral bodies are otherwise normally aligned with preservation of the normal lumbar lordosis.  Signal intensity within the visualized spinal cord is normal. Conus medullaris terminates at the L1 level.  Sequelae of prior kyphoplasty/vertebroplasty for pathologic fracture at L3 is seen. Vertebral body height at the L3 level is stable from prior study without progressive height loss.  There is abnormal T1 hypointense, hyperintense STIR signal intensity, with avid post-contrast enhancement about the L2-3 and L3-4 intervertebral disc spaces. There is heterogeneously  increased T2/STIR signal intensity within the L2-3 and L3-4 disc  spaces themselves. Mild endplate irregularity seen at the inferior endplate of L2, new from prior. Additionally, there is increased endplate irregularity with progressive intervertebral disc space height loss at the L3-4 level. Findings are concerning for osteomyelitis discitis. Enhancement and phlegmonous changes seen within the right paravertebral musculature at the level of L3-4 (series 1300, image 18). No loculated paraspinous abscess.  Distally, there is increased T2/STIR signal intensity within the left pedicle of L5 and about the left L5-S1 facet joint with avid post-contrast enhancement (series 1100, image 9). Edema and enhancement is seen within the adjacent paraspinous soft tissues. No definite paraspinous abscess at this level.  There is an apparent loculated collection within the left ventral epidural space posterior to the L5 vertebral body that measures approximately 1.1 x 1.1 x 1.5 cm (series 1100, image 7 on sagittal sequence; series 800, image 32 on axial sequence). Finding is concerning for a small epidural abscess. There is associated mass effect on the adjacent thecal sac with moderate canal stenosis. Mild right with moderate left foraminal narrowing at this level not significantly changed.  Additional multilevel degenerative changes within the lumbar spine are grossly stable from prior study.  IMPRESSION: 1. Findings concerning for osteomyelitis discitis at L2-3 and L3-4. 2. Abnormal edema and enhancement within the left pedicle of L5 and about the left L5-S1 joint, also concerning for infection/septic arthritis. 3. 1.1 x 1.1 x 1.5 cm loculated collection within the left ventral epidural space posterior to L5, concerning for possible epidural abscess. There is moderate central canal narrowing at this level.   Electronically Signed   By: Jeannine Boga M.D.   On: 09/21/2013 04:36   Dg Chest Port 1 View  (if Code Sepsis  Called)  09/20/2013   CLINICAL DATA:  Fever and leg pain.  Shortness of breath.  EXAM: PORTABLE CHEST - 1 VIEW  COMPARISON:  Chest radiograph performed 12/27/2012, and CT of the chest performed 07/12/2013  FINDINGS: The lungs are mildly hypoexpanded. Mild bibasilar opacities may reflect atelectasis or possibly mild pneumonia, given the patient's symptoms. There is no evidence of pleural effusion or pneumothorax.  The cardiomediastinal silhouette is borderline normal in size. No acute osseous abnormalities are seen. A large hiatal hernia is again seen.  IMPRESSION: 1. Lungs mildly hypoexpanded. Mild bibasilar airspace opacities may reflect atelectasis or possibly mild pneumonia, given the patient's symptoms. 2. Large hiatal hernia again noted.   Electronically Signed   By: Garald Balding M.D.   On: 09/20/2013 22:54   Recent Results (from the past 240 hour(s))  MRSA PCR SCREENING     Status: Abnormal   Collection Time    09/21/13  3:36 AM      Result Value Ref Range Status   MRSA by PCR POSITIVE (*) NEGATIVE Final   Comment:            The GeneXpert MRSA Assay (FDA     approved for NASAL specimens     only), is one component of a     comprehensive MRSA colonization     surveillance program. It is not     intended to diagnose MRSA     infection nor to guide or     monitor treatment for     MRSA infections.     RESULT CALLED TO, READ BACK BY AND VERIFIED WITH:     CALLED TO RN [facility] [id number] $[email or web address]$       09/21/2013, 12:03 PM     LOS: 1 day     **  Disclaimer: This note may have been dictated with voice recognition software. Similar sounding words can inadvertently be transcribed and this note may contain transcription errors which may not have been corrected upon publication of note.**

## 2013-09-21 NOTE — Progress Notes (Signed)
Positive MRSA PCR called/reported from lab; contact precautions initiated- Positive MRSA PCR order set started and orange sign placed on door.

## 2013-09-21 NOTE — ED Notes (Signed)
Notified Dr. Tamera Punt that with MAP less than 65, patient is now level 1 code sepsis. Plan for admission.

## 2013-09-21 NOTE — ED Notes (Signed)
MRI completed. Preparing for transport to [facility].

## 2013-09-21 NOTE — ED Notes (Signed)
Updated patient on plan of care, will plan to travel to MRI in approximately 30 min, and family care physician will be in to see patient.

## 2013-09-21 NOTE — ED Notes (Addendum)
RN travelled to MRI with patient.

## 2013-09-21 NOTE — H&P (Signed)
[facility] Admission History and Physical Service Pager: [phone]  Patient name: Autumn Duran Medical record number: [id number] Date of birth: 1950/04/30 Age: 63 y.o. Gender: female  Primary Care Provider: [name], MD Consultants: CCM Code Status: Full  Chief Complaint: Right leg pain, fever.  Assessment and Plan: Autumn Duran is a 63 y.o. female presenting with increased right leg pain over past 48 hours. PMH is significant for pathologic lumbar compression fracture, hypercalcemia, hypokalemia, smoldering myeloma, chronic back pain, lumbar radicular pain.  # Sepsis- SIRS criteria (Tachycardia, tachypnea, fever); patient initially seen with temperature of 101.71F, pulse 110, respiratory rate 24. Chest x-ray in the ED showed bibasilar airspace opacities.  Patient recently completed radiation therapy for smoldering myeloma last week. Patient denied dysuria, urinary frequency/urgency, abdominal pain. MRI of Lumbar spine returned early this am with evidence of epidural abscess and discitis.  - Patient admitted to SDU - IV vancomycin and Zosyn began in the ED; will continue for empiric coverage in setting of sepsis - Tylenol PRN fever - Patient hypotensive in ED; CCM consulted and aware of patient status; 3 L normal saline bolused while in the ED; patient blood pressure currently stable.  If hypotension recurs may need transfer to ICU. - Repeat labs in am; Will continue to monitor closely.  # Back and hip pain; pt has long history of radicular pain secondary to pathologic compression fracture of L3 in setting of plasmacytoma/smoldering myeloma.  Patient was in significant pain upon our introduction in the ED. She seemed to confuse the sidedness of her pain during our interactions. She stated that her pain was much better at the time of our departure from the ED.  - MRI ordered in ED. MRI returned revealing discitis/osteomyelitits L3-L5, with concern signs of  infection at L5-S1. Additionally, 1.1 x 1.1 x 1.5 cm loculated collection within the left ventral epidural space posterior to L5, concerning for possible epidural abscess.  Will continue Vanc/Zosyn. Will consult Neurosurgery. - Holding current home pain medications due to hypotension - Dilaudid 1 mg every 2 hour when necessary for severe pain with holding orders.  - Will continue to monitor closely  # Smoldering myeloma; patient currently being followed by oncology on an outpatient basis. Patient found to have pathologic compression fracture in May of this year. Patient completed radiation therapy last week. We will continue to monitor this. - Calcium 10.3 (corrected 11.34).  - IV fluids (s/p 3 NS boluses). Will continue IVF @ 125/hr.  FEN/GI: Heart healthy Prophylaxis: Lovenox  Disposition: Home with sister when deemed medically stable; current admission on telemetry  History of Present Illness: Autumn Duran is a 63 y.o. female presenting with severe hip and back pain, was found to have a fever on admission. Patient states that she is experiencing significant hip back and leg pain. This is been relatively progressive and has climax over the past 48 hours to the point where she can no longer walk herself to the bathroom without debilitating pain. Patient has a history of smoldering myeloma, diagnosed within the past 2 months. She is seen as an outpatient with oncology. Last week she completed her final radiation therapy treatment. She and her sister reports that since completion of this therapy her pain has been significantly worse in her legs specifically the right. She states that due to this pain she has not had much of an appetite and her oral intake is significantly reduced. She has been supplementing her diet with boost beverages. She she denies recent  fevers however she was found to have a low-grade fever on admission. She denies weakness, dysuria, increased cough (patient has a baseline cough),  chest pain, nausea, vomiting, diarrhea. Patient also reports that she has had difficulty with being urinary incontinent of late.  In the ED patient was found to have mild to moderate desaturation. She is placed on 2 L nasal cannula for O2 supplementation. ER team ordered a stat lumbar MRI which is currently pending. Blood pressures were found to be soft in the ED. She is given 1 L boluses of normal saline x3 to help correct this, which she has responded fairly well.  Patient has been admitted on telemetry due to her current status meeting Sirs criteria with possible source seen on chest x-ray. CCM has been notified of patient due to her soft blood pressures.   Review Of Systems: Per HPI Otherwise 12 point review of systems was performed and was unremarkable.  Patient Active Problem List   Diagnosis Date Noted  . Hypercalcemia 08/20/2013  . Hypokalemia 08/20/2013  . Lumbar compression fracture 07/12/2013  . Compression fracture 07/12/2013  . Benign neoplasm of colon 12/18/2011  . Esophageal reflux 12/18/2011  . Other dysphagia 12/18/2011   Past Medical History: Past Medical History  Diagnosis Date  . Diverticulosis   . Hemorrhoids   . Hx of adenomatous colonic polyps   . Hiatal hernia   . Hypertension   . Hyperlipidemia   . GERD (gastroesophageal reflux disease)   . Chronic kidney disease     kidney stone s/p stent placement ( removed)  . Complication of anesthesia   . PONV (postoperative nausea and vomiting)   . Bronchitis, chronic   . Chronic back pain   . Lumbar radicular pain   . Compression fracture of L3 lumbar vertebra   . T7 vertebral fracture   . Smoldering myeloma    Past Surgical History: Past Surgical History  Procedure Laterality Date  . Cholecystectomy    . Esophagogastroduodenoscopy  12/18/2011    Procedure: ESOPHAGOGASTRODUODENOSCOPY (EGD);  Surgeon: Lafayette Dragon, MD;  Location: [facility];  Service: Endoscopy;  Laterality: N/A;  . Colonoscopy   12/18/2011    Procedure: COLONOSCOPY;  Surgeon: Lafayette Dragon, MD;  Location: [facility];  Service: Endoscopy;  Laterality: N/A;  . Ureteral stent placement    . Vertebroplasty N/A 07/14/2013    Procedure: VERTEBROPLASTY WITH LUMBAR THREE BIOPSY;  Surgeon: Charlie Pitter, MD;  Location: [facility];  Service: Neurosurgery;  Laterality: N/A;  VERTEBROPLASTY WITH LUMBAR THREE BIOPSY  . Back surgery     Social History: History  Substance Use Topics  . Smoking status: Never Smoker   . Smokeless tobacco: Never Used  . Alcohol Use: Yes     Comment: social occ   Additional social history: None  Please also refer to relevant sections of EMR.  Family History: Family History  Problem Relation Age of Onset  . Breast cancer Sister 29  . Breast cancer Other 55    aunt   Allergies and Medications: Allergies  Allergen Reactions  . Morphine And Related Other (See Comments)    Headache  . Tetracyclines & Related Nausea And Vomiting   No current facility-administered medications on file prior to encounter.   Current Outpatient Prescriptions on File Prior to Encounter  Medication Sig Dispense Refill  . cyclobenzaprine (FLEXERIL) 5 MG tablet Take 1 tablet (5 mg total) by mouth 3 (three) times daily as needed for muscle spasms.  30 tablet  0  . dexamethasone (DECADRON) 4 MG tablet Take 4 mg by mouth daily.       . diazepam (VALIUM) 5 MG tablet Take 2.5 mg by mouth every 8 (eight) hours as needed for anxiety.      . feeding supplement (BOOST HIGH PROTEIN) LIQD Take 1 Container by mouth 3 (three) times daily between meals.      Marland Kitchen omeprazole (PRILOSEC) 40 MG capsule Take 40 mg by mouth daily as needed (acid reflux).       . OxyCODONE (OXYCONTIN) 20 mg T12A 12 hr tablet Take 1 tablet (20 mg total) by mouth every 12 (twelve) hours.  60 tablet  0  . potassium chloride SA (K-DUR,KLOR-CON) 20 MEQ tablet Take 1 tablet (20 mEq total) by mouth 2 (two) times daily.  60 tablet  3    Objective: BP 99/47   Pulse 73  Temp(Src) 97.9 F (36.6 C) (Oral)  Resp 14  SpO2 99% Exam: General -- oriented x3, cooperative, NAD, Answers questions appropriately but seems to get details of her history mixed up. HEENT -- Head is normocephalic. Extraocular motions are intact. Neck -- supple Integument -- Multiple lesions on face/neck, also possible early pressure sore noted on Rt gluteus  Chest -- Lungs clear to auscultation. Cardiac -- RRR. No murmurs noted.  Abdomen -- soft, nontender. No masses palpable. Normal bowel sounds present. Genital, rectal and breast exam -- deferred. CNS -- cranial nerves II through XII grossly intact. Normal LE muscle strength.  Musculoskeletal - Tenderness noted over area of right piriformis, NO tenderness noted at lumbar spine. ROM good. 4/5 bilateral strength. Dorsalis pedis pulses present and symmetrical.     Labs and Imaging: CBC BMET   Recent Labs Lab 09/20/13 2200  WBC 10.8*  HGB 11.7*  HCT 38.6  PLT 388    Recent Labs Lab 09/20/13 2200  NA 137  K 4.8  CL 102  CO2 25  BUN 15  CREATININE 0.65  GLUCOSE 116*  CALCIUM 10.3     CXR (7/25) IMPRESSION:  1. Lungs mildly hypoexpanded. Mild bibasilar airspace opacities may  reflect atelectasis or possibly mild pneumonia, given the patient's  symptoms.  2. Large hiatal hernia again noted.   Mr Lumbar Spine W Wo Contrast 09/21/2013    IMPRESSION: 1. Findings concerning for osteomyelitis discitis at L2-3 and L3-4. 2. Abnormal edema and enhancement within the left pedicle of L5 and about the left L5-S1 joint, also concerning for infection/septic arthritis. 3. 1.1 x 1.1 x 1.5 cm loculated collection within the left ventral epidural space posterior to L5, concerning for possible epidural abscess. There is moderate central canal narrowing at this level.    Autumn Leatherwood, MD 09/21/2013, 12:49 AM PGY-1, [place] Intern pager: [phone], text pages welcome  I have seen and evaluated the  above patient with Dr. Alease Frame.  Addendum in blue.  [place] PGY-3

## 2013-09-21 NOTE — Progress Notes (Signed)
ANTIBIOTIC CONSULT NOTE - INITIAL  Pharmacy Consult for Vancomycin and Zosyn  Indication: rule out sepsis  Allergies  Allergen Reactions  . Morphine And Related Other (See Comments)    Headache  . Tetracyclines & Related Nausea And Vomiting    Patient Measurements: Height: 5\' 1"  (154.9 cm) Weight: 174 lb 13.2 oz (79.3 kg) IBW/kg (Calculated) : 47.8 Adjusted Body Weight: 70 kg  Vital Signs: Temp: 97.9 F (36.6 C) (07/26 0010) Temp src: Oral (07/26 0010) BP: 107/46 mmHg (07/26 0400) Pulse Rate: 68 (07/26 0400) Intake/Output from previous day: 07/25 0701 - 07/26 0700 In: 3300 [I.V.:3300] Out: 650 [Urine:650] Intake/Output from this shift: Total I/O In: 3300 [I.V.:3300] Out: 650 [Urine:650]  Labs:  Recent Labs  09/20/13 2200  WBC 10.8*  HGB 11.7*  PLT 388  CREATININE 0.65   Estimated Creatinine Clearance: 69.5 ml/min (by C-G formula based on Cr of 0.65). No results found for this basename: VANCOTROUGH, VANCOPEAK, VANCORANDOM, GENTTROUGH, GENTPEAK, GENTRANDOM, TOBRATROUGH, TOBRAPEAK, TOBRARND, AMIKACINPEAK, AMIKACINTROU, AMIKACIN,  in the last 72 hours   Microbiology: No results found for this or any previous visit (from the past 720 hour(s)).  Medical History: Past Medical History  Diagnosis Date  . Diverticulosis   . Hemorrhoids   . Hx of adenomatous colonic polyps   . Hiatal hernia   . Hypertension   . Hyperlipidemia   . GERD (gastroesophageal reflux disease)   . Chronic kidney disease     kidney stone s/p stent placement ( removed)  . Complication of anesthesia   . PONV (postoperative nausea and vomiting)   . Bronchitis, chronic   . Chronic back pain   . Lumbar radicular pain   . Compression fracture of L3 lumbar vertebra   . T7 vertebral fracture   . Smoldering myeloma     Medications:  Prescriptions prior to admission  Medication Sig Dispense Refill  . cyclobenzaprine (FLEXERIL) 5 MG tablet Take 1 tablet (5 mg total) by mouth 3 (three) times  daily as needed for muscle spasms.  30 tablet  0  . dexamethasone (DECADRON) 4 MG tablet Take 4 mg by mouth daily.       . diazepam (VALIUM) 5 MG tablet Take 2.5 mg by mouth every 8 (eight) hours as needed for anxiety.      . feeding supplement (BOOST HIGH PROTEIN) LIQD Take 1 Container by mouth 3 (three) times daily between meals.      Marland Kitchen HYDROcodone-acetaminophen (NORCO) 10-325 MG per tablet Take 1 tablet by mouth every 6 (six) hours as needed for moderate pain.      Marland Kitchen omeprazole (PRILOSEC) 40 MG capsule Take 40 mg by mouth daily as needed (acid reflux).       . OxyCODONE (OXYCONTIN) 20 mg T12A 12 hr tablet Take 1 tablet (20 mg total) by mouth every 12 (twelve) hours.  60 tablet  0  . potassium chloride SA (K-DUR,KLOR-CON) 20 MEQ tablet Take 1 tablet (20 mEq total) by mouth 2 (two) times daily.  60 tablet  3   Assessment: 63 yo female with PNA/sepsis for empiric antibiotics  Vancomycin 1 g IV given in ED at 0030  Goal of Therapy:  Vancomycin trough level 15-20 mcg/ml  Plan:  Vancomycin 1 g IV q12h Zosyn 3.375 g IV q8h   Caryl Pina 09/21/2013,4:21 AM

## 2013-09-22 ENCOUNTER — Encounter (HOSPITAL_COMMUNITY): Payer: BC Managed Care – PPO | Admitting: Anesthesiology

## 2013-09-22 ENCOUNTER — Inpatient Hospital Stay (HOSPITAL_COMMUNITY): Payer: BC Managed Care – PPO

## 2013-09-22 ENCOUNTER — Encounter (HOSPITAL_COMMUNITY)
Admission: EM | Disposition: A | Discharge status: Home Health | Payer: Self-pay | Source: Home / Self Care | Attending: Family Medicine

## 2013-09-22 ENCOUNTER — Inpatient Hospital Stay (HOSPITAL_COMMUNITY): Payer: BC Managed Care – PPO | Admitting: Anesthesiology

## 2013-09-22 DIAGNOSIS — M519 Unspecified thoracic, thoracolumbar and lumbosacral intervertebral disc disorder: Secondary | ICD-10-CM | POA: Diagnosis not present

## 2013-09-22 DIAGNOSIS — A419 Sepsis, unspecified organism: Secondary | ICD-10-CM | POA: Diagnosis not present

## 2013-09-22 DIAGNOSIS — G062 Extradural and subdural abscess, unspecified: Secondary | ICD-10-CM | POA: Diagnosis not present

## 2013-09-22 DIAGNOSIS — T148XXA Other injury of unspecified body region, initial encounter: Secondary | ICD-10-CM | POA: Diagnosis not present

## 2013-09-22 DIAGNOSIS — IMO0002 Reserved for concepts with insufficient information to code with codable children: Secondary | ICD-10-CM | POA: Diagnosis not present

## 2013-09-22 DIAGNOSIS — G061 Intraspinal abscess and granuloma: Secondary | ICD-10-CM | POA: Diagnosis not present

## 2013-09-22 DIAGNOSIS — C9000 Multiple myeloma not having achieved remission: Secondary | ICD-10-CM

## 2013-09-22 DIAGNOSIS — X58XXXA Exposure to other specified factors, initial encounter: Secondary | ICD-10-CM

## 2013-09-22 DIAGNOSIS — A4902 Methicillin resistant Staphylococcus aureus infection, unspecified site: Secondary | ICD-10-CM | POA: Diagnosis not present

## 2013-09-22 HISTORY — PX: LUMBAR LAMINECTOMY FOR EPIDURAL ABSCESS: SHX5956

## 2013-09-22 LAB — CBC
HCT: 33.1 % — ABNORMAL LOW (ref 36.0–46.0)
Hemoglobin: 10.2 g/dL — ABNORMAL LOW (ref 12.0–15.0)
MCH: 25.9 pg — ABNORMAL LOW (ref 26.0–34.0)
MCHC: 30.8 g/dL (ref 30.0–36.0)
MCV: 84 fL (ref 78.0–100.0)
Platelets: 368 10*3/uL (ref 150–400)
RBC: 3.94 MIL/uL (ref 3.87–5.11)
RDW: 15.2 % (ref 11.5–15.5)
WBC: 7.7 10*3/uL (ref 4.0–10.5)

## 2013-09-22 LAB — BASIC METABOLIC PANEL
Anion gap: 9 (ref 5–15)
BUN: 14 mg/dL (ref 6–23)
CO2: 26 mEq/L (ref 19–32)
Calcium: 9.9 mg/dL (ref 8.4–10.5)
Chloride: 99 mEq/L (ref 96–112)
Creatinine, Ser: 0.48 mg/dL — ABNORMAL LOW (ref 0.50–1.10)
GFR calc Af Amer: >90 mL/min (ref >90)
GFR calc non Af Amer: >90 mL/min (ref >90)
Glucose, Bld: 136 mg/dL — ABNORMAL HIGH (ref 70–99)
Potassium: 4.2 mEq/L (ref 3.7–5.3)
Sodium: 134 mEq/L — ABNORMAL LOW (ref 137–147)

## 2013-09-22 LAB — URINE CULTURE
Colony Count: NO GROWTH
Culture: NO GROWTH

## 2013-09-22 SURGERY — LUMBAR LAMINECTOMY FOR EPIDURAL ABSCESS
Anesthesia: General | Laterality: Left

## 2013-09-22 MED ORDER — ACETAMINOPHEN 160 MG/5ML PO SOLN: 325.0000 mg | ORAL | Status: DC | PRN

## 2013-09-22 MED ORDER — HYDROMORPHONE HCL PF 1 MG/ML IJ SOLN
0.2500 mg | INTRAMUSCULAR | Status: DC | PRN
  Administered 2013-09-22 (×4): 0.5 mg via INTRAVENOUS

## 2013-09-22 MED ORDER — BUPIVACAINE HCL (PF) 0.25 % IJ SOLN
INTRAMUSCULAR | Status: DC | PRN
  Administered 2013-09-22: 20 mL

## 2013-09-22 MED ORDER — MIDAZOLAM HCL 2 MG/2ML IJ SOLN
INTRAMUSCULAR | Status: AC
  Filled 2013-09-22: qty 2

## 2013-09-22 MED ORDER — VECURONIUM BROMIDE 10 MG IV SOLR
INTRAVENOUS | Status: AC
  Filled 2013-09-22: qty 10

## 2013-09-22 MED ORDER — HYDROMORPHONE HCL PF 1 MG/ML IJ SOLN
INTRAMUSCULAR | Status: AC
  Administered 2013-09-22: 0.5 mg via INTRAVENOUS
  Filled 2013-09-22: qty 1

## 2013-09-22 MED ORDER — FENTANYL CITRATE 0.05 MG/ML IJ SOLN
INTRAMUSCULAR | Status: AC
  Filled 2013-09-22: qty 5

## 2013-09-22 MED ORDER — ROCURONIUM BROMIDE 100 MG/10ML IV SOLN
INTRAVENOUS | Status: DC | PRN
  Administered 2013-09-22: 50 mg via INTRAVENOUS

## 2013-09-22 MED ORDER — STERILE WATER FOR INJECTION IJ SOLN
INTRAMUSCULAR | Status: AC
  Filled 2013-09-22: qty 10

## 2013-09-22 MED ORDER — 0.9 % SODIUM CHLORIDE (POUR BTL) OPTIME
TOPICAL | Status: DC | PRN
  Administered 2013-09-22: 1000 mL

## 2013-09-22 MED ORDER — NEOSTIGMINE METHYLSULFATE 10 MG/10ML IV SOLN
INTRAVENOUS | Status: AC
  Filled 2013-09-22: qty 2

## 2013-09-22 MED ORDER — SODIUM CHLORIDE 0.9 % IR SOLN
Status: DC | PRN
  Administered 2013-09-22: 16:00:00

## 2013-09-22 MED ORDER — MIDAZOLAM HCL 5 MG/5ML IJ SOLN
INTRAMUSCULAR | Status: DC | PRN
  Administered 2013-09-22: 2 mg via INTRAVENOUS

## 2013-09-22 MED ORDER — NEOSTIGMINE METHYLSULFATE 10 MG/10ML IV SOLN
INTRAVENOUS | Status: DC | PRN
  Administered 2013-09-22: 4 mg via INTRAVENOUS

## 2013-09-22 MED ORDER — ONDANSETRON HCL 4 MG/2ML IJ SOLN
INTRAMUSCULAR | Status: DC | PRN
  Administered 2013-09-22: 4 mg via INTRAVENOUS

## 2013-09-22 MED ORDER — VECURONIUM BROMIDE 10 MG IV SOLR
INTRAVENOUS | Status: DC | PRN
  Administered 2013-09-22: 2 mg via INTRAVENOUS

## 2013-09-22 MED ORDER — HEMOSTATIC AGENTS (NO CHARGE) OPTIME
TOPICAL | Status: DC | PRN
  Administered 2013-09-22: 1 via TOPICAL

## 2013-09-22 MED ORDER — DEXAMETHASONE SODIUM PHOSPHATE 4 MG/ML IJ SOLN
INTRAMUSCULAR | Status: DC | PRN
  Administered 2013-09-22: 4 mg via INTRAVENOUS

## 2013-09-22 MED ORDER — KETOROLAC TROMETHAMINE 30 MG/ML IJ SOLN
INTRAMUSCULAR | Status: DC | PRN
  Administered 2013-09-22: 30 mg via INTRAVENOUS

## 2013-09-22 MED ORDER — ACETAMINOPHEN 325 MG PO TABS: 325.0000 mg | ORAL_TABLET | ORAL | Status: DC | PRN

## 2013-09-22 MED ORDER — ARTIFICIAL TEARS OP OINT
TOPICAL_OINTMENT | OPHTHALMIC | Status: DC | PRN
  Administered 2013-09-22: 1 via OPHTHALMIC

## 2013-09-22 MED ORDER — DEXAMETHASONE SODIUM PHOSPHATE 4 MG/ML IJ SOLN
INTRAMUSCULAR | Status: AC
  Filled 2013-09-22: qty 1

## 2013-09-22 MED ORDER — OXYCODONE HCL 5 MG/5ML PO SOLN: 5.0000 mg | Freq: Once | ORAL | Status: DC | PRN

## 2013-09-22 MED ORDER — THROMBIN 5000 UNITS EX SOLR
CUTANEOUS | Status: DC | PRN
  Administered 2013-09-22 (×3): 5000 [IU] via TOPICAL

## 2013-09-22 MED ORDER — OXYCODONE HCL 5 MG PO TABS: 5.0000 mg | ORAL_TABLET | Freq: Once | ORAL | Status: DC | PRN

## 2013-09-22 MED ORDER — LACTATED RINGERS IV SOLN
INTRAVENOUS | Status: DC | PRN
  Administered 2013-09-22: 15:00:00 via INTRAVENOUS

## 2013-09-22 MED ORDER — FENTANYL CITRATE 0.05 MG/ML IJ SOLN
INTRAMUSCULAR | Status: DC | PRN
  Administered 2013-09-22: 50 ug via INTRAVENOUS
  Administered 2013-09-22: 150 ug via INTRAVENOUS
  Administered 2013-09-22: 50 ug via INTRAVENOUS

## 2013-09-22 MED ORDER — GLYCOPYRROLATE 0.2 MG/ML IJ SOLN
INTRAMUSCULAR | Status: AC
  Filled 2013-09-22: qty 3

## 2013-09-22 MED ORDER — LIDOCAINE HCL (CARDIAC) 20 MG/ML IV SOLN
INTRAVENOUS | Status: DC | PRN
  Administered 2013-09-22: 80 mg via INTRAVENOUS

## 2013-09-22 MED ORDER — GLYCOPYRROLATE 0.2 MG/ML IJ SOLN
INTRAMUSCULAR | Status: DC | PRN
  Administered 2013-09-22: 0.6 mg via INTRAVENOUS

## 2013-09-22 MED ORDER — PROPOFOL 10 MG/ML IV BOLUS
INTRAVENOUS | Status: DC | PRN
  Administered 2013-09-22 (×2): 50 mg via INTRAVENOUS

## 2013-09-22 MED ORDER — ONDANSETRON HCL 4 MG/2ML IJ SOLN: 4.0000 mg | Freq: Once | INTRAMUSCULAR | Status: DC | PRN

## 2013-09-22 SURGICAL SUPPLY — 54 items
APL SKNCLS STERI-STRIP NONHPOA (GAUZE/BANDAGES/DRESSINGS) ×1
BAG DECANTER FOR FLEXI CONT (MISCELLANEOUS) ×3 IMPLANT
BENZOIN TINCTURE PRP APPL 2/3 (GAUZE/BANDAGES/DRESSINGS) ×3 IMPLANT
BLADE 10 SAFETY STRL DISP (BLADE) ×3 IMPLANT
BLADE SURG ROTATE 9660 (MISCELLANEOUS) IMPLANT
BRUSH SCRUB EZ PLAIN DRY (MISCELLANEOUS) ×3 IMPLANT
BUR CUTTER 7.0 ROUND (BURR) ×3 IMPLANT
CANISTER SUCT 3000ML (MISCELLANEOUS) ×3 IMPLANT
CLOSURE WOUND 1/2 X4 (GAUZE/BANDAGES/DRESSINGS) ×1
CONT SPEC 4OZ CLIKSEAL STRL BL (MISCELLANEOUS) ×3 IMPLANT
DECANTER SPIKE VIAL GLASS SM (MISCELLANEOUS) ×3 IMPLANT
DERMABOND ADVANCED (GAUZE/BANDAGES/DRESSINGS) ×2
DERMABOND ADVANCED .7 DNX12 (GAUZE/BANDAGES/DRESSINGS) ×1 IMPLANT
DRAPE LAPAROTOMY 100X72X124 (DRAPES) ×3 IMPLANT
DRAPE MICROSCOPE LEICA (MISCELLANEOUS) ×3 IMPLANT
DRAPE POUCH INSTRU U-SHP 10X18 (DRAPES) ×3 IMPLANT
DRAPE PROXIMA HALF (DRAPES) IMPLANT
DRAPE SURG 17X23 STRL (DRAPES) ×6 IMPLANT
DRSG OPSITE POSTOP 4X6 (GAUZE/BANDAGES/DRESSINGS) ×3 IMPLANT
DURAPREP 26ML APPLICATOR (WOUND CARE) ×3 IMPLANT
ELECT REM PT RETURN 9FT ADLT (ELECTROSURGICAL) ×3
ELECTRODE REM PT RTRN 9FT ADLT (ELECTROSURGICAL) ×1 IMPLANT
GAUZE SPONGE 4X4 16PLY XRAY LF (GAUZE/BANDAGES/DRESSINGS) IMPLANT
GLOVE BIO SURGEON STRL SZ 6.5 (GLOVE) ×4 IMPLANT
GLOVE BIO SURGEONS STRL SZ 6.5 (GLOVE) ×2
GLOVE ECLIPSE 9.0 STRL (GLOVE) ×3 IMPLANT
GLOVE EXAM NITRILE LRG STRL (GLOVE) IMPLANT
GLOVE EXAM NITRILE MD LF STRL (GLOVE) IMPLANT
GLOVE EXAM NITRILE XL STR (GLOVE) IMPLANT
GLOVE EXAM NITRILE XS STR PU (GLOVE) IMPLANT
GOWN STRL REUS W/ TWL LRG LVL3 (GOWN DISPOSABLE) ×1 IMPLANT
GOWN STRL REUS W/ TWL XL LVL3 (GOWN DISPOSABLE) ×2 IMPLANT
GOWN STRL REUS W/TWL 2XL LVL3 (GOWN DISPOSABLE) IMPLANT
GOWN STRL REUS W/TWL LRG LVL3 (GOWN DISPOSABLE) ×3
GOWN STRL REUS W/TWL XL LVL3 (GOWN DISPOSABLE) ×4
KIT BASIN OR (CUSTOM PROCEDURE TRAY) ×3 IMPLANT
KIT ROOM TURNOVER OR (KITS) ×3 IMPLANT
NEEDLE HYPO 22GX1.5 SAFETY (NEEDLE) ×3 IMPLANT
NEEDLE SPNL 22GX3.5 QUINCKE BK (NEEDLE) ×3 IMPLANT
NS IRRIG 1000ML POUR BTL (IV SOLUTION) ×3 IMPLANT
PACK LAMINECTOMY NEURO (CUSTOM PROCEDURE TRAY) ×3 IMPLANT
PAD ARMBOARD 7.5X6 YLW CONV (MISCELLANEOUS) ×9 IMPLANT
RUBBERBAND STERILE (MISCELLANEOUS) ×6 IMPLANT
SPONGE GAUZE 4X4 12PLY (GAUZE/BANDAGES/DRESSINGS) ×3 IMPLANT
SPONGE SURGIFOAM ABS GEL SZ50 (HEMOSTASIS) ×3 IMPLANT
STRIP CLOSURE SKIN 1/2X4 (GAUZE/BANDAGES/DRESSINGS) ×2 IMPLANT
SUT VIC AB 2-0 CT1 18 (SUTURE) ×3 IMPLANT
SUT VIC AB 3-0 SH 8-18 (SUTURE) ×3 IMPLANT
SWAB COLLECTION DEVICE MRSA (MISCELLANEOUS) ×3 IMPLANT
SYR 20ML ECCENTRIC (SYRINGE) ×3 IMPLANT
TOWEL OR 17X24 6PK STRL BLUE (TOWEL DISPOSABLE) ×3 IMPLANT
TOWEL OR 17X26 10 PK STRL BLUE (TOWEL DISPOSABLE) ×3 IMPLANT
TUBE ANAEROBIC SPECIMEN COL (MISCELLANEOUS) ×3 IMPLANT
WATER STERILE IRR 1000ML POUR (IV SOLUTION) ×3 IMPLANT

## 2013-09-22 NOTE — Progress Notes (Signed)
Utilization review completed.  

## 2013-09-22 NOTE — Progress Notes (Signed)
Patient ID: Autumn Duran, female   DOB: Jun 20, 1950, 63 y.o.   MRN: [id number]         [facility]    Date of Admission:  09/20/2013           Day 3 vancomycin        Day 3 cefepime Principal Problem:   Sepsis Active Problems:   Compression fracture   Hypercalcemia   Smoldering myeloma   . antiseptic oral rinse  15 mL Mouth Rinse BID  . ceFEPime (MAXIPIME) IV  2 g Intravenous Q12H  . Chlorhexidine Gluconate Cloth  6 each Topical Q0600  . dexamethasone  4 mg Oral Daily  . enoxaparin (LOVENOX) injection  40 mg Subcutaneous Q24H  . feeding supplement  1 Container Oral TID BM  . mupirocin ointment  1 application Nasal BID  . sodium chloride  3 mL Intravenous Q12H  . vancomycin  1,000 mg Intravenous Q12H    Subjective: Her pain is under better control currently.  Past Medical History  Diagnosis Date  . Diverticulosis   . Hemorrhoids   . Hx of adenomatous colonic polyps   . Hiatal hernia   . Hypertension   . Hyperlipidemia   . GERD (gastroesophageal reflux disease)   . Chronic kidney disease     kidney stone s/p stent placement ( removed)  . Complication of anesthesia   . PONV (postoperative nausea and vomiting)   . Bronchitis, chronic   . Chronic back pain   . Lumbar radicular pain   . Compression fracture of L3 lumbar vertebra   . T7 vertebral fracture   . Smoldering myeloma     History  Substance Use Topics  . Smoking status: Never Smoker   . Smokeless tobacco: Never Used  . Alcohol Use: Yes     Comment: social occ    Family History  Problem Relation Age of Onset  . Breast cancer Sister 54  . Breast cancer Other 57    aunt    Allergies  Allergen Reactions  . Morphine And Related Other (See Comments)    Headache  . Tetracyclines & Related Nausea And Vomiting    Objective: Temp:  [98 F (36.7 C)-98.7 F (37.1 C)] 98.7 F (37.1 C) (07/27 1159) Pulse Rate:  [66-75] 75 (07/27 0815) Resp:  [16-26] 26 (07/27 0815) BP:  (104-120)/(40-58) 116/52 mmHg (07/27 0815) SpO2:  [93 %-98 %] 94 % (07/27 0815) Weight:  [80 kg (176 lb 5.9 oz)] 80 kg (176 lb 5.9 oz) (07/26 2001)  General: She is sleeping but arouses easily and is in no distress Skin: No rash Lungs: Clear Cor: Regular S1-S2 no murmurs Abdomen: Soft and nontender   Lab Results Lab Results  Component Value Date   WBC 7.7 09/22/2013   HGB 10.2* 09/22/2013   HCT 33.1* 09/22/2013   MCV 84.0 09/22/2013   PLT 368 09/22/2013    Lab Results  Component Value Date   CREATININE 0.48* 09/22/2013   BUN 14 09/22/2013   NA 134* 09/22/2013   K 4.2 09/22/2013   CL 99 09/22/2013   CO2 26 09/22/2013    Lab Results  Component Value Date   ALT 16 09/20/2013   AST 16 09/20/2013   ALKPHOS 98 09/20/2013   BILITOT 0.6 09/20/2013      Microbiology: Recent Results (from the past 240 hour(s))  CULTURE, BLOOD (ROUTINE X 2)     Status: None   Collection Time    09/20/13 10:00 PM  Result Value Ref Range Status   Specimen Description BLOOD RIGHT ARM   Final   Special Requests BOTTLES DRAWN AEROBIC AND ANAEROBIC 10CC   Final   Culture  Setup Time     Final   Value: 09/21/2013 14:50     Performed at [facility]   Culture     Final   Value:        BLOOD CULTURE RECEIVED NO GROWTH TO DATE CULTURE WILL BE HELD FOR 5 DAYS BEFORE ISSUING A FINAL NEGATIVE REPORT     Performed at [facility]   Report Status PENDING   Incomplete  CULTURE, BLOOD (ROUTINE X 2)     Status: None   Collection Time    09/20/13 10:41 PM      Result Value Ref Range Status   Specimen Description BLOOD LEFT ARM   Final   Special Requests BOTTLES DRAWN AEROBIC ONLY 4 CC   Final   Culture  Setup Time     Final   Value: 09/21/2013 14:50     Performed at [facility]   Culture     Final   Value:        BLOOD CULTURE RECEIVED NO GROWTH TO DATE CULTURE WILL BE HELD FOR 5 DAYS BEFORE ISSUING A FINAL NEGATIVE REPORT     Performed at [facility]   Report Status PENDING    Incomplete  URINE CULTURE     Status: None   Collection Time    09/20/13 11:34 PM      Result Value Ref Range Status   Specimen Description URINE, CATHETERIZED   Final   Special Requests NONE   Final   Culture  Setup Time     Final   Value: 09/21/2013 15:53     Performed at [place] [place]     Final   Value: NO GROWTH     Performed at [facility]   Culture     Final   Value: NO GROWTH     Performed at [facility]   Report Status 09/22/2013 FINAL   Final  MRSA PCR SCREENING     Status: Abnormal   Collection Time    09/21/13  3:36 AM      Result Value Ref Range Status   MRSA by PCR POSITIVE (*) NEGATIVE Final   Comment:            The GeneXpert MRSA Assay (FDA     approved for NASAL specimens     only), is one component of a     comprehensive MRSA colonization     surveillance program. It is not     intended to diagnose MRSA     infection nor to guide or     monitor treatment for     MRSA infections.     RESULT CALLED TO, READ BACK BY AND VERIFIED WITH:     CALLED TO RN [facility] [phone] @[phone]     Studies/Results: Mr Lumbar Spine W Wo Contrast  09/21/2013   CLINICAL DATA:  back pain, fever  EXAM: MRI LUMBAR SPINE WITHOUT AND WITH CONTRAST  TECHNIQUE: Multiplanar and multiecho pulse sequences of the lumbar spine were obtained without and with intravenous contrast.  CONTRAST:  61mL MULTIHANCE GADOBENATE DIMEGLUMINE 529 MG/ML IV SOLN  COMPARISON:  Prior radiograph from earlier the same day as well as previous MRI from 07/04/2013.  FINDINGS: For the purposes of this dictation, the  lowest well-formed intervertebral disc spaces presumed to be the L5-S1 level, and there presumed to be 5 lumbar type vertebral bodies.  5 mm of anterolisthesis of L5 on S1 is stable from prior. Vertebral bodies are otherwise normally aligned with preservation of the normal lumbar lordosis.  Signal intensity within the visualized spinal cord is normal. Conus  medullaris terminates at the L1 level.  Sequelae of prior kyphoplasty/vertebroplasty for pathologic fracture at L3 is seen. Vertebral body height at the L3 level is stable from prior study without progressive height loss.  There is abnormal T1 hypointense, hyperintense STIR signal intensity, with avid post-contrast enhancement about the L2-3 and L3-4 intervertebral disc spaces. There is heterogeneously increased T2/STIR signal intensity within the L2-3 and L3-4 disc spaces themselves. Mild endplate irregularity seen at the inferior endplate of L2, new from prior. Additionally, there is increased endplate irregularity with progressive intervertebral disc space height loss at the L3-4 level. Findings are concerning for osteomyelitis discitis. Enhancement and phlegmonous changes seen within the right paravertebral musculature at the level of L3-4 (series 1300, image 18). No loculated paraspinous abscess.  Distally, there is increased T2/STIR signal intensity within the left pedicle of L5 and about the left L5-S1 facet joint with avid post-contrast enhancement (series 1100, image 9). Edema and enhancement is seen within the adjacent paraspinous soft tissues. No definite paraspinous abscess at this level.  There is an apparent loculated collection within the left ventral epidural space posterior to the L5 vertebral body that measures approximately 1.1 x 1.1 x 1.5 cm (series 1100, image 7 on sagittal sequence; series 800, image 32 on axial sequence). Finding is concerning for a small epidural abscess. There is associated mass effect on the adjacent thecal sac with moderate canal stenosis. Mild right with moderate left foraminal narrowing at this level not significantly changed.  Additional multilevel degenerative changes within the lumbar spine are grossly stable from prior study.  IMPRESSION: 1. Findings concerning for osteomyelitis discitis at L2-3 and L3-4. 2. Abnormal edema and enhancement within the left pedicle of  L5 and about the left L5-S1 joint, also concerning for infection/septic arthritis. 3. 1.1 x 1.1 x 1.5 cm loculated collection within the left ventral epidural space posterior to L5, concerning for possible epidural abscess. There is moderate central canal narrowing at this level.   Electronically Signed   By: Jeannine Boga M.D.   On: 09/21/2013 04:36   Dg Chest Port 1 View  (if Code Sepsis Called)  09/20/2013   CLINICAL DATA:  Fever and leg pain.  Shortness of breath.  EXAM: PORTABLE CHEST - 1 VIEW  COMPARISON:  Chest radiograph performed 12/27/2012, and CT of the chest performed 07/12/2013  FINDINGS: The lungs are mildly hypoexpanded. Mild bibasilar opacities may reflect atelectasis or possibly mild pneumonia, given the patient's symptoms. There is no evidence of pleural effusion or pneumothorax.  The cardiomediastinal silhouette is borderline normal in size. No acute osseous abnormalities are seen. A large hiatal hernia is again seen.  IMPRESSION: 1. Lungs mildly hypoexpanded. Mild bibasilar airspace opacities may reflect atelectasis or possibly mild pneumonia, given the patient's symptoms. 2. Large hiatal hernia again noted.   Electronically Signed   By: Garald Balding M.D.   On: 09/20/2013 22:54    Assessment: I will continue empiric antibiotic therapy pending the results of today's surgery.  Plan: 1. Continue current antibiotics  Michel Bickers, MD [facility] [place] [phone] pager   [phone] cell 09/22/2013, 2:51 PM

## 2013-09-22 NOTE — Progress Notes (Signed)
Family Medicine Teaching Service Daily Progress Note Intern Pager: [phone]  Patient name: Autumn Duran Medical record number: [id number] Date of birth: [birth year] Age: 63 y.o. Gender: female  Primary Care Provider: [name], MD Consultants: ID, Neurosurgery, CCM Code Status: Full  Pt Overview and Major Events to Date:  7/26: Admitted; Lumbar MRI with evidence of possible abscess/osteomyelitis; Neurosurg and ID consulted  Assessment and Plan:  # Possible epidural abscess/osteomyelitis with (currently resolved) Sepsis- SIRS criteria (Tachycardia, tachypnea, fever); patient initially seen with temperature of 101.73F, pulse 110, respiratory rate 24. Patient recently completed radiation therapy for smoldering myeloma last week. MRI of Lumbar spine returned evidence of epidural abscess and discitis.  - Patient hypotensive in ED; CCM consulted and aware of patient status; 3 L normal saline bolused while in the ED; patient blood pressure currently stable. - Neurosurgery and ID consulted (we appreciate the recs) - IV vancomycin and Zosyn began in the ED; Zosyn has been DCd--started Cefepime (per ID) - Labs per Neurosurgery: ESR 7 (wnl); CRP 20.9 (H) - HIV neg - WBC (6.8), Hgb (9.7) down from 11.7 - Blood cultures pending  # Back and hip pain; pt has long history of radicular pain secondary to pathologic compression fracture of L3 in setting of plasmacytoma/smoldering myeloma.   - Lumbar MRI: revealed discitis/osteomyelitits L3-L5, with concern signs of infection at L5-S1. Additionally, 1.1 x 1.1 x 1.5 cm loculated collection within the left ventral epidural space posterior to L5, concerning for possible epidural abscess.  - Holding current home pain medications due to hypotension  - Dilaudid 1 mg every 2 hour when necessary for severe pain with holding orders.  - Will continue to monitor closely   # Smoldering myeloma; patient currently being followed by oncology on an outpatient basis.  Patient found to have pathologic compression fracture in May of this year. Patient completed radiation therapy last week. We will continue to monitor this.  - Calcium 10.3 (corrected 11.34).  - IV fluids (s/p 3 NS boluses). Will continue IVF @ 125/hr.   FEN/GI: heart healthy PPx: Lovenox  Disposition: Home with sister when medically stable  Subjective:  Patient is laying in bed awake, alert, and oriented. She states that she is still in some pain. It is localized to her Lt ankle and she describes it was achy in nature. Pain is adequately controlled w/ current regimen but she states that she still has to ask for it every time it becomes available (Q2H). Patient is inquiring about whether she will be going back in for surgery on her lumbar spine. I informed her that the decision would be left for neurosurgery.  Objective: Temp:  [98 F (36.7 C)-98.3 F (36.8 C)] 98.1 F (36.7 C) (07/27 0700) Pulse Rate:  [64-75] 75 (07/27 0815) Resp:  [14-26] 26 (07/27 0815) BP: (82-120)/(40-61) 116/52 mmHg (07/27 0815) SpO2:  [92 %-100 %] 94 % (07/27 0815) Weight:  [176 lb 5.9 oz (80 kg)] 176 lb 5.9 oz (80 kg) (07/26 2001) Physical Exam: General -- oriented x3, cooperative, NAD  HEENT -- Head is normocephalic. Extraocular motions are intact.  Neck -- supple  Chest -- Lungs clear to auscultation.  Cardiac -- RRR. No murmurs noted.  Abdomen -- soft, nontender. No masses palpable. Normal bowel sounds present.  CNS -- cranial nerves II through XII grossly intact. Musculoskeletal - Sitting awkwardly in bed apparently due to "leg pain". Tenderness at left lateral foot (not increased w/ palpation). no tenderness noted at lumbar spine. ROM good. 4/5 bilateral strength.  Dorsalis pedis pulses present and symmetrical.    Laboratory:  Recent Labs Lab 09/20/13 2200 09/21/13 0650  WBC 10.8* 6.8  HGB 11.7* 9.7*  HCT 38.6 32.2*  PLT 388 283    Recent Labs Lab 09/20/13 2200 09/21/13 0650  NA 137  --    K 4.8  --   CL 102  --   CO2 25  --   BUN 15  --   CREATININE 0.65 0.52  CALCIUM 10.3  --   PROT 7.0  --   BILITOT 0.6  --   ALKPHOS 98  --   ALT 16  --   AST 16  --   GLUCOSE 116*  --     CRP: 20.9 (H) ESR: 7 (wnl) HIV: neg   Imaging/Diagnostic Tests: Lumbar MRI 7/26 IMPRESSION:  1. Findings concerning for osteomyelitis discitis at L2-3 and L3-4.  2. Abnormal edema and enhancement within the left pedicle of L5 and  about the left L5-S1 joint, also concerning for infection/septic  arthritis.  3. 1.1 x 1.1 x 1.5 cm loculated collection within the left ventral  epidural space posterior to L5, concerning for possible epidural  abscess. There is moderate central canal narrowing at this level.   Elberta Leatherwood, MD 09/22/2013, 9:10 AM PGY-1, [place] Intern pager: [phone], text pages welcome

## 2013-09-22 NOTE — Progress Notes (Signed)
I have seen and examined this patient. I have discussed with Dr Alease Frame.  I agree with their findings and plans as documented in their progress note.  Dr Trenton Gammon planning on taking patient to OR for evaluation and treatment of the lumbar epidural lesion causing sciatic nerve symptoms and signs.

## 2013-09-22 NOTE — Progress Notes (Signed)
Patient is admitted with fevers and continued left lower extremity pain and weakness. Inflammatory markers elevated with a C. reactive protein greater than 20. MRI scan with a focal enhancing mass lesion on the left at L5-S1 with compression of her exiting left L5 nerve root. Patient has significant left lower extremity dorsiflexion weakness with extensor hallucis longus 2/5 and anterior tibialis 3/5. She has severe pain with straight leg raising on the left. She has no right-sided symptoms.  Given her motor weakness and this lesion coupled with the patient's unusual presentation of her myelopathy I think her best option is surgical exploration and determination of the nature of this mass lesion. I discussed the risks and benefits involved with a left-sided L5-S1 laminotomy and expiration of her spinal canal. She is aware the risks involved with surgery and wishes to proceed.

## 2013-09-22 NOTE — Anesthesia Postprocedure Evaluation (Signed)
Anesthesia Post Note  Patient: Autumn Duran  Procedure(s) Performed: Procedure(s) (LRB): LUMBAR LAMINECTOMY FOR EPIDURAL ABSCESS LEFT LUMBAR FIVE-SACRAL ONE (Left)  Anesthesia type: General  Patient location: PACU  Post pain: Pain level controlled and Adequate analgesia  Post assessment: Post-op Vital signs reviewed, Patient's Cardiovascular Status Stable, Respiratory Function Stable, Patent Airway and Pain level controlled  Last Vitals:  Filed Vitals:   09/22/13 1720  BP:   Pulse: 87  Temp:   Resp: 19    Post vital signs: Reviewed and stable  Level of consciousness: awake, alert  and oriented  Complications: No apparent anesthesia complications

## 2013-09-22 NOTE — Progress Notes (Signed)
Ms. Autumn Duran Oncologist was contacted to inform him of her current situation. Dr. Barnet Glasgow was unavailable at the time but I was able to speak w/ his PA on duty. Pts current status and plans to have neurosurgery take her to the OR was discussed. They had no further questions at this time.   Autumn Lynch, MD.

## 2013-09-22 NOTE — Transfer of Care (Signed)
Immediate Anesthesia Transfer of Care Note  Patient: Autumn Duran  Procedure(s) Performed: Procedure(s): LUMBAR LAMINECTOMY FOR EPIDURAL ABSCESS LEFT LUMBAR FIVE-SACRAL ONE (Left)  Patient Location: PACU  Anesthesia Type:General  Level of Consciousness: awake, alert  and oriented  Airway & Oxygen Therapy: Patient Spontanous Breathing and Patient connected to face mask oxygen  Post-op Assessment: Report given to PACU RN and Post -op Vital signs reviewed and stable  Post vital signs: Reviewed and stable  Complications: No apparent anesthesia complications

## 2013-09-22 NOTE — Anesthesia Preprocedure Evaluation (Signed)
Anesthesia Evaluation  Patient identified by MRN, date of birth, ID band Patient awake    Reviewed: Allergy & Precautions, H&P , NPO status , Patient's Chart, lab work & pertinent test results  History of Anesthesia Complications (+) PONV and history of anesthetic complications  Airway Mallampati: II      Dental  (+) Teeth Intact   Pulmonary neg sleep apnea,  Chronic cough     rales    Cardiovascular hypertension, Pt. on medications - angina- Past MI and - CHF - Valvular Problems/MurmursRhythm:Regular     Neuro/Psych Epidural abscess with left leg symptoms and bowl/bladder dysfunction negative psych ROS   GI/Hepatic Neg liver ROS, hiatal hernia, GERD-  Medicated and Controlled,  Endo/Other  neg diabetesMorbid obesity  Renal/GU negative Renal ROS     Musculoskeletal   Abdominal   Peds  Hematology Possible myeloma s/p radiation   Anesthesia Other Findings   Reproductive/Obstetrics                           Anesthesia Physical Anesthesia Plan  ASA: III  Anesthesia Plan: General   Post-op Pain Management:    Induction: Intravenous  Airway Management Planned: Oral ETT  Additional Equipment: None  Intra-op Plan:   Post-operative Plan: Extubation in OR  Informed Consent: I have reviewed the patients History and Physical, chart, labs and discussed the procedure including the risks, benefits and alternatives for the proposed anesthesia with the patient or authorized representative who has indicated his/her understanding and acceptance.   Dental advisory given  Plan Discussed with: CRNA and Surgeon  Anesthesia Plan Comments:         Anesthesia Quick Evaluation

## 2013-09-22 NOTE — Op Note (Signed)
Date of procedure: 09/22/2013  Date of dictation: Same  Service: Neurosurgery  Preoperative diagnosis: Left L5-S1 epidural abscess with left L5 radiculopathy  Postoperative diagnosis: Same  Procedure Name: Left L5-S1 hemilaminectomy with resection of epidural abscess  Surgeon:Charnel Giles A.Klarissa Mcilvain, M.D.  Asst. Surgeon: Saintclair Halsted  Anesthesia: General  Indication: 63 year old female with recently diagnosed plasmacytoma/multiple myeloma status post vertebroplasty at L3 on the left and recent spinal radiation. She presents now with worsening left lower extremity pain her season weakness and some element of fever. Workup demonstrates evidence of an enhancing mass within the spinal canal worrisome for epidural abscess causing compression of the left L5 nerve root. This is discontinue his in far separated from her previous vertebroplasty side it is not felt to be related. Patient presents now for laminotomy and evacuation of abscess as well as obtaining cultures.  Operative note: After induction anesthesia, patient positioned prone on the Wilson frame and appropriately padded. Lumbar region prepped and draped. Incision made overlying L5-S1.    dissection made on the left side. Retractor placed. X-ray taken. Level confirmed. Hemilaminectomy performed on the left-sided L5-S1 removing the entire hemilamina of L5 and the medial aspect the L5-S1 facet joint. Ligament flavum was elevated and resected piecemeal fashion. Underlying thecal sac and exiting S1 and L5 nerve roots were notified. Microscope brought field these were microdissection. Working in the axilla of the left L5 nerve root a loculated collection of pus was entered. An cultures were obtained. Purulence was completely evacuated as was the entire capsule of the abscess itself. The L5 nerve root on the left side were skeletonized the thecal sac was cleared of any inflammatory reactive tissue. There is no evidence of infection within either the L4-5 disc space  or L5-S1 disc space. The facet joint on the left-sided L4-5 appeared abnormal but I could not find any frank purulence. The wound was then irrigated out like solution. Gelfoam was placed topically for hemostasis and then removed. Wounds and close in layers with Vicryl sutures. Steri-Strips and sterile dressing were applied. No apparent complications. Patient tolerated procedure well and she returns to the recovery room postop.

## 2013-09-22 NOTE — Brief Op Note (Signed)
09/20/2013 - 09/22/2013  5:02 PM  PATIENT:  Levell July  63 y.o. female  PRE-OPERATIVE DIAGNOSIS:  Left Lumbar five -Sacral One epidural abscess  POST-OPERATIVE DIAGNOSIS:  Left Lumbar five -Sacral One epidural abscess  PROCEDURE:  Procedure(s): LUMBAR LAMINECTOMY FOR EPIDURAL ABSCESS LEFT LUMBAR FIVE-SACRAL ONE (Left)  SURGEON:  Surgeon(s) and Role:    * Charlie Pitter, MD - Primary  PHYSICIAN ASSISTANT:   ASSISTANTS: Cram   ANESTHESIA:   general  EBL:  Total I/O In: 300 [I.V.:300] Out: -   BLOOD ADMINISTERED:none  DRAINS: none   LOCAL MEDICATIONS USED:  MARCAINE     SPECIMEN:  No Specimen  DISPOSITION OF SPECIMEN:  N/A  COUNTS:  YES  TOURNIQUET:  * No tourniquets in log *  DICTATION: .Dragon Dictation  PLAN OF CARE: Admit to inpatient   PATIENT DISPOSITION:  PACU - hemodynamically stable.   Delay start of Pharmacological VTE agent (>24hrs) due to surgical blood loss or risk of bleeding: yes

## 2013-09-23 DIAGNOSIS — G061 Intraspinal abscess and granuloma: Secondary | ICD-10-CM | POA: Diagnosis not present

## 2013-09-23 DIAGNOSIS — A419 Sepsis, unspecified organism: Secondary | ICD-10-CM | POA: Diagnosis not present

## 2013-09-23 DIAGNOSIS — G062 Extradural and subdural abscess, unspecified: Secondary | ICD-10-CM | POA: Diagnosis not present

## 2013-09-23 DIAGNOSIS — M519 Unspecified thoracic, thoracolumbar and lumbosacral intervertebral disc disorder: Secondary | ICD-10-CM | POA: Diagnosis not present

## 2013-09-23 DIAGNOSIS — C9000 Multiple myeloma not having achieved remission: Secondary | ICD-10-CM | POA: Diagnosis not present

## 2013-09-23 LAB — CBC
HCT: 29.3 % — ABNORMAL LOW (ref 36.0–46.0)
Hemoglobin: 9.1 g/dL — ABNORMAL LOW (ref 12.0–15.0)
MCH: 25.6 pg — ABNORMAL LOW (ref 26.0–34.0)
MCHC: 31.1 g/dL (ref 30.0–36.0)
MCV: 82.5 fL (ref 78.0–100.0)
Platelets: 340 10*3/uL (ref 150–400)
RBC: 3.55 MIL/uL — ABNORMAL LOW (ref 3.87–5.11)
RDW: 15.3 % (ref 11.5–15.5)
WBC: 9.6 10*3/uL (ref 4.0–10.5)

## 2013-09-23 LAB — BASIC METABOLIC PANEL
Anion gap: 7 (ref 5–15)
BUN: 19 mg/dL (ref 6–23)
CO2: 22 mEq/L (ref 19–32)
Calcium: 9.3 mg/dL (ref 8.4–10.5)
Chloride: 103 mEq/L (ref 96–112)
Creatinine, Ser: 0.5 mg/dL (ref 0.50–1.10)
GFR calc Af Amer: >90 mL/min (ref >90)
GFR calc non Af Amer: >90 mL/min (ref >90)
Glucose, Bld: 144 mg/dL — ABNORMAL HIGH (ref 70–99)
Potassium: 5.5 mEq/L — ABNORMAL HIGH (ref 3.7–5.3)
Sodium: 132 mEq/L — ABNORMAL LOW (ref 137–147)

## 2013-09-23 LAB — VANCOMYCIN, TROUGH: Vancomycin Tr: 21.3 ug/mL — ABNORMAL HIGH (ref 10.0–20.0)

## 2013-09-23 MED ORDER — ALUM & MAG HYDROXIDE-SIMETH 200-200-20 MG/5ML PO SUSP
30.0000 mL | ORAL | Status: DC | PRN
  Administered 2013-09-23: 30 mL via ORAL
  Filled 2013-09-23: qty 30

## 2013-09-23 MED ORDER — VANCOMYCIN HCL IN DEXTROSE 750-5 MG/150ML-% IV SOLN
750.0000 mg | Freq: Two times a day (BID) | INTRAVENOUS | Status: DC
  Administered 2013-09-24 – 2013-09-26 (×6): 750 mg via INTRAVENOUS
  Filled 2013-09-23 (×8): qty 150

## 2013-09-23 MED ORDER — PANTOPRAZOLE SODIUM 40 MG PO TBEC
40.0000 mg | DELAYED_RELEASE_TABLET | Freq: Two times a day (BID) | ORAL | Status: DC | PRN
  Administered 2013-09-23: 40 mg via ORAL
  Filled 2013-09-23: qty 1

## 2013-09-23 NOTE — Progress Notes (Signed)
Discussed PICC placement with patient including risks, benefits, and alternatives. Patient asks to speak to physician and family first. Primary RN Truman Hayward notified.

## 2013-09-23 NOTE — Progress Notes (Signed)
ANTIBIOTIC CONSULT NOTE - Follow Up  Pharmacy Consult for Vancomycin   Indication: rule out sepsis  Allergies  Allergen Reactions  . Morphine And Related Other (See Comments)    Headache  . Tetracyclines & Related Nausea And Vomiting    Patient Measurements: Height: 5\' 1"  (154.9 cm) Weight: 176 lb 5.9 oz (80 kg) IBW/kg (Calculated) : 47.8 Adjusted Body Weight: 70 kg  Vital Signs: Temp: 98.1 F (36.7 C) (07/28 2000) Temp src: Oral (07/28 2000) BP: 121/60 mmHg (07/28 2000) Pulse Rate: 62 (07/28 2000) Intake/Output from previous day: 07/27 0701 - 07/28 0700 In: 560 [I.V.:310; IV Piggyback:250] Out: 400 [Urine:400] Intake/Output from this shift:    Labs:  Recent Labs  09/21/13 0650 09/22/13 0924 09/23/13 0313  WBC 6.8 7.7 9.6  HGB 9.7* 10.2* 9.1*  PLT 283 368 340  CREATININE 0.52 0.48* 0.50   Estimated Creatinine Clearance: 69.9 ml/min (by C-G formula based on Cr of 0.5).  Recent Labs  09/23/13 1945  [place] 21.3*     Microbiology: Recent Results (from the past 720 hour(s))  CULTURE, BLOOD (ROUTINE X 2)     Status: None   Collection Time    09/20/13 10:00 PM      Result Value Ref Range Status   Specimen Description BLOOD RIGHT ARM   Final   Special Requests BOTTLES DRAWN AEROBIC AND ANAEROBIC 10CC   Final   Culture  Setup Time     Final   Value: 09/21/2013 14:50     Performed at [facility]   Culture     Final   Value:        BLOOD CULTURE RECEIVED NO GROWTH TO DATE CULTURE WILL BE HELD FOR 5 DAYS BEFORE ISSUING A FINAL NEGATIVE REPORT     Performed at [facility]   Report Status PENDING   Incomplete  CULTURE, BLOOD (ROUTINE X 2)     Status: None   Collection Time    09/20/13 10:41 PM      Result Value Ref Range Status   Specimen Description BLOOD LEFT ARM   Final   Special Requests BOTTLES DRAWN AEROBIC ONLY 4 CC   Final   Culture  Setup Time     Final   Value: 09/21/2013 14:50     Performed at [facility]   Culture     Final   Value:        BLOOD CULTURE RECEIVED NO GROWTH TO DATE CULTURE WILL BE HELD FOR 5 DAYS BEFORE ISSUING A FINAL NEGATIVE REPORT     Performed at [facility]   Report Status PENDING   Incomplete  URINE CULTURE     Status: None   Collection Time    09/20/13 11:34 PM      Result Value Ref Range Status   Specimen Description URINE, CATHETERIZED   Final   Special Requests NONE   Final   Culture  Setup Time     Final   Value: 09/21/2013 15:53     Performed at [place]     Final   Value: NO GROWTH     Performed at [facility]   Culture     Final   Value: NO GROWTH     Performed at [facility]   Report Status 09/22/2013 FINAL   Final  MRSA PCR SCREENING     Status: Abnormal   Collection Time    09/21/13  3:36 AM  Result Value Ref Range Status   MRSA by PCR POSITIVE (*) NEGATIVE Final   Comment:            The GeneXpert MRSA Assay (FDA     approved for NASAL specimens     only), is one component of a     comprehensive MRSA colonization     surveillance program. It is not     intended to diagnose MRSA     infection nor to guide or     monitor treatment for     MRSA infections.     RESULT CALLED TO, READ BACK BY AND VERIFIED WITH:     CALLED TO [place] [id number] @[phone]   CULTURE, ROUTINE-ABSCESS     Status: None   Collection Time    09/22/13  4:27 PM      Result Value Ref Range Status   Specimen Description ABSCESS BACK   Final   Special Requests     Final   Value: SPONTANEOUS EPIDURAL PATIENT ON FOLLOWING VANC AND MAXIPINE   Gram Stain     Final   Value: RARE WBC PRESENT, PREDOMINANTLY MONONUCLEAR     NO SQUAMOUS EPITHELIAL CELLS SEEN     FEW GRAM POSITIVE COCCI IN PAIRS     IN CLUSTERS     Performed at [facility]   Culture     Final   Value: NO GROWTH     Performed at [facility]   Report Status PENDING   Incomplete  ANAEROBIC CULTURE     Status: None   Collection Time     09/22/13  4:27 PM      Result Value Ref Range Status   Specimen Description ABSCESS BACK   Final   Special Requests     Final   Value: SPONTANEOUS EPIDURAL PATIENT ON FOLLOWING VANC AND MAXIPINE   Gram Stain     Final   Value: RARE WBC PRESENT, PREDOMINANTLY MONONUCLEAR     NO SQUAMOUS EPITHELIAL CELLS SEEN     FEW GRAM POSITIVE COCCI IN PAIRS     IN CLUSTERS     Performed at [facility]   Culture     Final   Value: NO ANAEROBES ISOLATED; CULTURE IN PROGRESS FOR 5 DAYS     Performed at [facility]   Report Status PENDING   Incomplete    Medical History: Past Medical History  Diagnosis Date  . Diverticulosis   . Hemorrhoids   . Hx of adenomatous colonic polyps   . Hiatal hernia   . Hypertension   . Hyperlipidemia   . GERD (gastroesophageal reflux disease)   . Chronic kidney disease     kidney stone s/p stent placement ( removed)  . Complication of anesthesia   . PONV (postoperative nausea and vomiting)   . Bronchitis, chronic   . Chronic back pain   . Lumbar radicular pain   . Compression fracture of L3 lumbar vertebra   . T7 vertebral fracture   . Smoldering myeloma     Medications:  Prescriptions prior to admission  Medication Sig Dispense Refill  . cyclobenzaprine (FLEXERIL) 5 MG tablet Take 1 tablet (5 mg total) by mouth 3 (three) times daily as needed for muscle spasms.  30 tablet  0  . dexamethasone (DECADRON) 4 MG tablet Take 4 mg by mouth daily.       . diazepam (VALIUM) 5 MG tablet Take 2.5 mg by mouth every 8 (eight) hours  as needed for anxiety.      . feeding supplement (BOOST HIGH PROTEIN) LIQD Take 1 Container by mouth 3 (three) times daily between meals.      Marland Kitchen HYDROcodone-acetaminophen (NORCO) 10-325 MG per tablet Take 1 tablet by mouth every 6 (six) hours as needed for moderate pain.      Marland Kitchen omeprazole (PRILOSEC) 40 MG capsule Take 40 mg by mouth daily as needed (acid reflux).       . OxyCODONE (OXYCONTIN) 20 mg T12A 12 hr tablet Take 1  tablet (20 mg total) by mouth every 12 (twelve) hours.  60 tablet  0  . potassium chloride SA (K-DUR,KLOR-CON) 20 MEQ tablet Take 1 tablet (20 mEq total) by mouth 2 (two) times daily.  60 tablet  3   Assessment: 63 yo female with PNA/sepsis for empiric antibiotics.  Had an epidural abscess debrided and drained yesterday.  Abscess cultures so far growing GPC, Blood cultures with no growth to date.  Likely needs long-term IV antibiotics.  Vancomycin trough slightly supratherapeutic on 1g IV q12 hrs.  Renal function stable.  Goal of Therapy:  Vancomycin trough level 15-20 mcg/ml  Plan:  1. Decrease vancomycin to 750 mg IV q 12 hrs. 2. Recheck vancomycin trough at steady state. 3. F/u culture results.  Uvaldo Rising, BCPS  Clinical Pharmacist Pager [phone]  09/23/2013 9:05 PM

## 2013-09-23 NOTE — Progress Notes (Signed)
Patient ID: Autumn Duran, female   DOB: Mar 18, 1950, 63 y.o.   MRN: [id number]         [facility]    Date of Admission:  09/20/2013           Day 4 vancomycin        Day 4 cefepime Principal Problem:   Sepsis Active Problems:   Compression fracture   Hypercalcemia   Smoldering myeloma   . antiseptic oral rinse  15 mL Mouth Rinse BID  . Chlorhexidine Gluconate Cloth  6 each Topical Q0600  . dexamethasone  4 mg Oral Daily  . feeding supplement  1 Container Oral TID BM  . mupirocin ointment  1 application Nasal BID  . sodium chloride  3 mL Intravenous Q12H  . vancomycin  1,000 mg Intravenous Q12H    Subjective: Her pain is under better control since surgery yesterday.  Past Medical History  Diagnosis Date  . Diverticulosis   . Hemorrhoids   . Hx of adenomatous colonic polyps   . Hiatal hernia   . Hypertension   . Hyperlipidemia   . GERD (gastroesophageal reflux disease)   . Chronic kidney disease     kidney stone s/p stent placement ( removed)  . Complication of anesthesia   . PONV (postoperative nausea and vomiting)   . Bronchitis, chronic   . Chronic back pain   . Lumbar radicular pain   . Compression fracture of L3 lumbar vertebra   . T7 vertebral fracture   . Smoldering myeloma     History  Substance Use Topics  . Smoking status: Never Smoker   . Smokeless tobacco: Never Used  . Alcohol Use: Yes     Comment: social occ    Family History  Problem Relation Age of Onset  . Breast cancer Sister 41  . Breast cancer Other 68    aunt    Allergies  Allergen Reactions  . Morphine And Related Other (See Comments)    Headache  . Tetracyclines & Related Nausea And Vomiting    Objective: Temp:  [97.9 F (36.6 C)-98.3 F (36.8 C)] 97.9 F (36.6 C) (07/28 1421) Pulse Rate:  [36-79] 59 (07/28 1619) Resp:  [6-22] 16 (07/28 1619) BP: (96-127)/(31-96) 105/50 mmHg (07/28 1619) SpO2:  [93 %-99 %] 97 % (07/28 1619)  General: She  is sleeping but arouses easily and is in no distress Skin: No rash Lungs: Clear Cor: Regular S1-S2 no murmurs Abdomen: Soft and nontender   Lab Results Lab Results  Component Value Date   WBC 9.6 09/23/2013   HGB 9.1* 09/23/2013   HCT 29.3* 09/23/2013   MCV 82.5 09/23/2013   PLT 340 09/23/2013    Lab Results  Component Value Date   CREATININE 0.50 09/23/2013   BUN 19 09/23/2013   NA 132* 09/23/2013   K 5.5* 09/23/2013   CL 103 09/23/2013   CO2 22 09/23/2013    Lab Results  Component Value Date   ALT 16 09/20/2013   AST 16 09/20/2013   ALKPHOS 98 09/20/2013   BILITOT 0.6 09/20/2013      Microbiology: Recent Results (from the past 240 hour(s))  CULTURE, BLOOD (ROUTINE X 2)     Status: None   Collection Time    09/20/13 10:00 PM      Result Value Ref Range Status   Specimen Description BLOOD RIGHT ARM   Final   Special Requests BOTTLES DRAWN AEROBIC AND ANAEROBIC 10CC   Final  Culture  Setup Time     Final   Value: 09/21/2013 14:50     Performed at [facility]   Culture     Final   Value:        BLOOD CULTURE RECEIVED NO GROWTH TO DATE CULTURE WILL BE HELD FOR 5 DAYS BEFORE ISSUING A FINAL NEGATIVE REPORT     Performed at [facility]   Report Status PENDING   Incomplete  CULTURE, BLOOD (ROUTINE X 2)     Status: None   Collection Time    09/20/13 10:41 PM      Result Value Ref Range Status   Specimen Description BLOOD LEFT ARM   Final   Special Requests BOTTLES DRAWN AEROBIC ONLY 4 CC   Final   Culture  Setup Time     Final   Value: 09/21/2013 14:50     Performed at [facility]   Culture     Final   Value:        BLOOD CULTURE RECEIVED NO GROWTH TO DATE CULTURE WILL BE HELD FOR 5 DAYS BEFORE ISSUING A FINAL NEGATIVE REPORT     Performed at [facility]   Report Status PENDING   Incomplete  URINE CULTURE     Status: None   Collection Time    09/20/13 11:34 PM      Result Value Ref Range Status   Specimen Description URINE, CATHETERIZED    Final   Special Requests NONE   Final   Culture  Setup Time     Final   Value: 09/21/2013 15:53     Performed at [facility]     Final   Value: NO GROWTH     Performed at [facility]   Culture     Final   Value: NO GROWTH     Performed at [facility]   Report Status 09/22/2013 FINAL   Final  MRSA PCR SCREENING     Status: Abnormal   Collection Time    09/21/13  3:36 AM      Result Value Ref Range Status   MRSA by PCR POSITIVE (*) NEGATIVE Final   Comment:            The GeneXpert MRSA Assay (FDA     approved for NASAL specimens     only), is one component of a     comprehensive MRSA colonization     surveillance program. It is not     intended to diagnose MRSA     infection nor to guide or     monitor treatment for     MRSA infections.     RESULT CALLED TO, READ BACK BY AND VERIFIED WITH:     CALLED TO RN [facility] [phone] @[phone]   CULTURE, ROUTINE-ABSCESS     Status: None   Collection Time    09/22/13  4:27 PM      Result Value Ref Range Status   Specimen Description ABSCESS BACK   Final   Special Requests     Final   Value: SPONTANEOUS EPIDURAL PATIENT ON FOLLOWING VANC AND MAXIPINE   Gram Stain     Final   Value: RARE WBC PRESENT, PREDOMINANTLY MONONUCLEAR     NO SQUAMOUS EPITHELIAL CELLS SEEN     FEW GRAM POSITIVE COCCI IN PAIRS     IN CLUSTERS     Performed at [facility]  Final   Value: NO GROWTH     Performed at [facility]   Report Status PENDING   Incomplete  ANAEROBIC CULTURE     Status: None   Collection Time    09/22/13  4:27 PM      Result Value Ref Range Status   Specimen Description ABSCESS BACK   Final   Special Requests     Final   Value: SPONTANEOUS EPIDURAL PATIENT ON FOLLOWING VANC AND MAXIPINE   Gram Stain     Final   Value: RARE WBC PRESENT, PREDOMINANTLY MONONUCLEAR     NO SQUAMOUS EPITHELIAL CELLS SEEN     FEW GRAM POSITIVE COCCI IN PAIRS     IN CLUSTERS      Performed at [facility]   Culture     Final   Value: NO ANAEROBES ISOLATED; CULTURE IN PROGRESS FOR 5 DAYS     Performed at [facility]   Report Status PENDING   Incomplete    Studies/Results: Dg Lumbar Spine 1 View  09/22/2013   CLINICAL DATA:  Lumbar spine surgery.  EXAM: LUMBAR SPINE - 1 VIEW  COMPARISON:  Lumbar spine MRI 09/21/2013.  FINDINGS: Metallic marker noted along the posterior aspect of the lower lumbar spine at the L5-S1 disc space. Lumbar vertebra are numbered with the lowest segmented vertebrae as L5. Methylmethacrylate noted in the L3 vertebral body.  IMPRESSION: 1. Metallic marker noted at the L5-S1 disc space. 2. Methylmethacrylate in the L3 vertebral body.   Electronically Signed   By: Marcello Moores  Register   On: 09/22/2013 16:24    Assessment: She had an epidural abscess debrided and drained yesterday. Operative Gram stain shows gram-positive cocci in pairs and clusters. I will continue vancomycin alone pending final culture results. She will need a PICC for long term IV antibiotics.  Plan: 1. Continue vancomycin 2. Discontinue cefepime 3. Have PICC placed  Michel Bickers, MD [facility] for [place] [phone] pager   [phone] cell 09/23/2013, 5:22 PM

## 2013-09-23 NOTE — Progress Notes (Signed)
Family Medicine Teaching Service Daily Progress Note Intern Pager: [phone]  Patient name: Autumn Duran Medical record number: [id number] Date of birth: 1950-04-02 Age: 63 y.o. Gender: female  Primary Care Provider: [name], MD Consultants: ID, Neurosurgery, CCM Code Status: Full  Pt Overview and Major Events to Date:  7/26: Admitted; Lumbar MRI with evidence of possible abscess/osteomyelitis; Neurosurg and ID consulted 7/27: Patient taken to OR by neurosurgery for hemilaminectomy and abscess removal  Assessment and Plan:  # Possible epidural abscess/osteomyelitis with (currently resolved) Sepsis- SIRS criteria (Tachycardia, tachypnea, fever); patient initially seen with temperature of 101.29F, pulse 110, respiratory rate 24. Patient recently completed radiation therapy for smoldering myeloma last week. MRI of Lumbar spine returned evidence of epidural abscess and discitis.  - Patient hypotensive in ED; CCM consulted and aware of patient status; 3 L normal saline bolused while in the ED; patient blood pressure currently stable. - Neurosurgery and ID consulted (we appreciate the recs) - IV vancomycin and Zosyn began in the ED; Zosyn has been DCd--started Cefepime (per ID) - Labs per Neurosurgery: ESR 7 (wnl); CRP 20.9 (H) - HIV neg - WBC (6.8), Hgb (9.7) down from 11.7 - Blood cultures pending - Neurosurgery brought into OR 7/27; removed purulent sac from L5 level; pt tolerated procedure well; Cultures (pending)  # Back and hip pain; pt has long history of radicular pain secondary to pathologic compression fracture of L3 in setting of plasmacytoma/smoldering myeloma.   - Lumbar MRI: revealed discitis/osteomyelitits L3-L5, with concern signs of infection at L5-S1. Additionally, 1.1 x 1.1 x 1.5 cm loculated collection within the left ventral epidural space posterior to L5, concerning for possible epidural abscess.  - Holding current home pain medications due to hypotension  - Dilaudid 1  mg every 2 hour when necessary for severe pain with holding orders.  - Will continue to monitor closely   # Smoldering myeloma; patient currently being followed by oncology on an outpatient basis. Patient found to have pathologic compression fracture in May of this year. Patient completed radiation therapy last week. We will continue to monitor this.  - Calcium 10.3 (corrected 11.34).  - IV fluids (s/p 3 NS boluses). Will continue IVF @ 125/hr.   FEN/GI: heart healthy PPx: Lovenox  Disposition: Home with sister when medically stable  Subjective:  Patient is laying in bed awake, alert, and oriented. She states that she is still in some pain. It is localized to her Lt ankle and she describes it was achy in nature. Pt reports adequate pain control. No new issues to date.  Objective: Temp:  [98 F (36.7 C)-98.3 F (36.8 C)] 98 F (36.7 C) (07/28 1108) Pulse Rate:  [36-87] 53 (07/28 1108) Resp:  [6-22] 15 (07/28 1108) BP: (96-131)/(31-96) 98/54 mmHg (07/28 1108) SpO2:  [94 %-99 %] 97 % (07/28 1108) Physical Exam: General -- oriented x3, cooperative, NAD  HEENT -- Head is normocephalic. Extraocular motions are intact.  Neck -- supple  Chest -- Lungs clear to auscultation.  Cardiac -- RRR. No murmurs noted.  Abdomen -- soft, nontender. No masses palpable. Normal bowel sounds present.  CNS -- cranial nerves II through XII grossly intact. Musculoskeletal - Sitting awkwardly in bed apparently due to "leg pain". Tenderness at left lateral foot (unchanged from yesterday). ROM good. 4/5 bilateral strength. Dorsalis pedis pulses present and symmetrical. Diminished dorsiflexion on left leg.   Laboratory:  Recent Labs Lab 09/21/13 0650 09/22/13 0924 09/23/13 0313  WBC 6.8 7.7 9.6  HGB 9.7* 10.2* 9.1*  HCT 32.2* 33.1* 29.3*  PLT 283 368 340    Recent Labs Lab 09/20/13 2200 09/21/13 0650 09/22/13 0924 09/23/13 0313  NA 137  --  134* 132*  K 4.8  --  4.2 5.5*  CL 102  --  99 103   CO2 25  --  26 22  BUN 15  --  14 19  CREATININE 0.65 0.52 0.48* 0.50  CALCIUM 10.3  --  9.9 9.3  PROT 7.0  --   --   --   BILITOT 0.6  --   --   --   ALKPHOS 98  --   --   --   ALT 16  --   --   --   AST 16  --   --   --   GLUCOSE 116*  --  136* 144*    CRP: 20.9 (H) ESR: 7 (wnl) HIV: neg   Imaging/Diagnostic Tests: Lumbar MRI 7/26 IMPRESSION:  1. Findings concerning for osteomyelitis discitis at L2-3 and L3-4.  2. Abnormal edema and enhancement within the left pedicle of L5 and  about the left L5-S1 joint, also concerning for infection/septic  arthritis.  3. 1.1 x 1.1 x 1.5 cm loculated collection within the left ventral  epidural space posterior to L5, concerning for possible epidural  abscess. There is moderate central canal narrowing at this level.   Elberta Leatherwood, MD 09/23/2013, 1:00 PM PGY-1, [facility] Intern pager: [phone], text pages welcome

## 2013-09-23 NOTE — Progress Notes (Signed)
I discussed with  Dr Alease Frame.  I agree with their plans documented in their progress note.

## 2013-09-23 NOTE — Progress Notes (Signed)
Postop day 1. Overall patient's pain level improved. Having much less left lower extremity pain. Her strength and sensation in her left lower extremity is very much improved. Dorsiflexion now for over 5 in her left foot. Dressing is dry. She's been afebrile through the night.  Status post lumbar laminotomy and evacuation of epidural abscess. Gram stain reveals gram positive cocci. Cultures pending. Continue current antibiotics. May mobilize without restriction.

## 2013-09-24 ENCOUNTER — Encounter (HOSPITAL_COMMUNITY): Payer: Self-pay | Admitting: Neurosurgery

## 2013-09-24 DIAGNOSIS — T148XXA Other injury of unspecified body region, initial encounter: Secondary | ICD-10-CM | POA: Diagnosis not present

## 2013-09-24 DIAGNOSIS — A419 Sepsis, unspecified organism: Secondary | ICD-10-CM | POA: Diagnosis not present

## 2013-09-24 DIAGNOSIS — M519 Unspecified thoracic, thoracolumbar and lumbosacral intervertebral disc disorder: Secondary | ICD-10-CM | POA: Diagnosis not present

## 2013-09-24 DIAGNOSIS — G061 Intraspinal abscess and granuloma: Secondary | ICD-10-CM | POA: Diagnosis not present

## 2013-09-24 DIAGNOSIS — C9000 Multiple myeloma not having achieved remission: Secondary | ICD-10-CM | POA: Diagnosis not present

## 2013-09-24 LAB — CBC
HCT: 32.6 % — ABNORMAL LOW (ref 36.0–46.0)
Hemoglobin: 10 g/dL — ABNORMAL LOW (ref 12.0–15.0)
MCH: 25.2 pg — ABNORMAL LOW (ref 26.0–34.0)
MCHC: 30.7 g/dL (ref 30.0–36.0)
MCV: 82.1 fL (ref 78.0–100.0)
Platelets: 368 10*3/uL (ref 150–400)
RBC: 3.97 MIL/uL (ref 3.87–5.11)
RDW: 15.5 % (ref 11.5–15.5)
WBC: 10.2 10*3/uL (ref 4.0–10.5)

## 2013-09-24 LAB — BASIC METABOLIC PANEL
Anion gap: 11 (ref 5–15)
BUN: 23 mg/dL (ref 6–23)
CO2: 29 mEq/L (ref 19–32)
Calcium: 10.2 mg/dL (ref 8.4–10.5)
Chloride: 99 mEq/L (ref 96–112)
Creatinine, Ser: 0.65 mg/dL (ref 0.50–1.10)
GFR calc Af Amer: >90 mL/min (ref >90)
GFR calc non Af Amer: >90 mL/min (ref >90)
Glucose, Bld: 80 mg/dL (ref 70–99)
Potassium: 3.7 mEq/L (ref 3.7–5.3)
Sodium: 139 mEq/L (ref 137–147)

## 2013-09-24 MED ORDER — PANTOPRAZOLE SODIUM 40 MG PO TBEC: 40.0000 mg | DELAYED_RELEASE_TABLET | Freq: Two times a day (BID) | ORAL | Status: DC | PRN

## 2013-09-24 MED ORDER — METOCLOPRAMIDE HCL 5 MG/ML IJ SOLN
5.0000 mg | Freq: Once | INTRAMUSCULAR | Status: AC
  Administered 2013-09-24: 5 mg via INTRAVENOUS
  Filled 2013-09-24: qty 1

## 2013-09-24 MED ORDER — CALCIUM CARBONATE ANTACID 500 MG PO CHEW
1.0000 | CHEWABLE_TABLET | Freq: Once | ORAL | Status: AC
  Administered 2013-09-24: 200 mg via ORAL
  Filled 2013-09-24: qty 1

## 2013-09-24 MED ORDER — OXYCODONE HCL 5 MG PO TABS
5.0000 mg | ORAL_TABLET | Freq: Four times a day (QID) | ORAL | Status: DC | PRN
  Administered 2013-09-24 – 2013-09-26 (×5): 5 mg via ORAL
  Filled 2013-09-24 (×5): qty 1

## 2013-09-24 MED ORDER — SODIUM CHLORIDE 0.9 % IJ SOLN: 10.0000 mL | INTRAMUSCULAR | Status: DC | PRN

## 2013-09-24 MED ORDER — OXYCODONE HCL ER 10 MG PO T12A
20.0000 mg | EXTENDED_RELEASE_TABLET | Freq: Two times a day (BID) | ORAL | Status: DC
  Administered 2013-09-24 – 2013-09-26 (×5): 20 mg via ORAL
  Filled 2013-09-24 (×5): qty 2

## 2013-09-24 MED ORDER — PANTOPRAZOLE SODIUM 40 MG PO TBEC
40.0000 mg | DELAYED_RELEASE_TABLET | Freq: Once | ORAL | Status: AC
  Administered 2013-09-24: 40 mg via ORAL
  Filled 2013-09-24: qty 1

## 2013-09-24 MED ORDER — SENNOSIDES-DOCUSATE SODIUM 8.6-50 MG PO TABS
2.0000 | ORAL_TABLET | Freq: Every day | ORAL | Status: DC
  Administered 2013-09-24 – 2013-09-26 (×3): 2 via ORAL
  Filled 2013-09-24 (×3): qty 2

## 2013-09-24 MED ORDER — SODIUM CHLORIDE 0.9 % IJ SOLN
10.0000 mL | Freq: Two times a day (BID) | INTRAMUSCULAR | Status: DC
  Administered 2013-09-24 – 2013-09-26 (×4): 10 mL

## 2013-09-24 NOTE — Progress Notes (Addendum)
@  5208 Attending returned page. Attending to come assess pt. Pt made aware.  @0310  Re-paged Attending on-call regarding pt's nausea still unrelieved by IV Reglan. Awaiting return page.  @Approx  0200 paged on-call Attending regarding pt's nausea unrelieved by PRN Zofran and Maalox. Order received for 1 time dose IV Reglan.

## 2013-09-24 NOTE — Progress Notes (Signed)
Patient ID: Autumn Duran, female   DOB: May 03, 1950, 63 y.o.   MRN: [id number]         [facility]    Date of Admission:  09/20/2013           Day 5 vancomycin  Principal Problem:   Sepsis Active Problems:   Compression fracture   Hypercalcemia   Smoldering myeloma   . antiseptic oral rinse  15 mL Mouth Rinse BID  . Chlorhexidine Gluconate Cloth  6 each Topical Q0600  . dexamethasone  4 mg Oral Daily  . feeding supplement  1 Container Oral TID BM  . mupirocin ointment  1 application Nasal BID  . OxyCODONE  20 mg Oral Q12H  . senna-docusate  2 tablet Oral Daily  . sodium chloride  10-40 mL Intracatheter Q12H  . sodium chloride  3 mL Intravenous Q12H  . vancomycin  750 mg Intravenous Q12H    Objective: Temp:  [97.7 F (36.5 C)-98.7 F (37.1 C)] 98.7 F (37.1 C) (07/29 1413) Pulse Rate:  [56-74] 71 (07/29 1413) Resp:  [13-19] 18 (07/29 1413) BP: (99-151)/(50-67) 114/59 mmHg (07/29 1413) SpO2:  [91 %-97 %] 91 % (07/29 1413)  General: She is sleeping soundly and I did not wake her up. She has a new PICC.  Lab Results Lab Results  Component Value Date   WBC 10.2 09/24/2013   HGB 10.0* 09/24/2013   HCT 32.6* 09/24/2013   MCV 82.1 09/24/2013   PLT 368 09/24/2013    Lab Results  Component Value Date   CREATININE 0.65 09/24/2013   BUN 23 09/24/2013   NA 139 09/24/2013   K 3.7 09/24/2013   CL 99 09/24/2013   CO2 29 09/24/2013     Microbiology: Recent Results (from the past 240 hour(s))  CULTURE, BLOOD (ROUTINE X 2)     Status: None   Collection Time    09/20/13 10:00 PM      Result Value Ref Range Status   Specimen Description BLOOD RIGHT ARM   Final   Special Requests BOTTLES DRAWN AEROBIC AND ANAEROBIC 10CC   Final   Culture  Setup Time     Final   Value: 09/21/2013 14:50     Performed at [facility]   Culture     Final   Value:        BLOOD CULTURE RECEIVED NO GROWTH TO DATE CULTURE WILL BE HELD FOR 5 DAYS BEFORE ISSUING A FINAL  NEGATIVE REPORT     Performed at [facility]   Report Status PENDING   Incomplete  CULTURE, BLOOD (ROUTINE X 2)     Status: None   Collection Time    09/20/13 10:41 PM      Result Value Ref Range Status   Specimen Description BLOOD LEFT ARM   Final   Special Requests BOTTLES DRAWN AEROBIC ONLY 4 CC   Final   Culture  Setup Time     Final   Value: 09/21/2013 14:50     Performed at [facility]   Culture     Final   Value:        BLOOD CULTURE RECEIVED NO GROWTH TO DATE CULTURE WILL BE HELD FOR 5 DAYS BEFORE ISSUING A FINAL NEGATIVE REPORT     Performed at [facility]   Report Status PENDING   Incomplete  URINE CULTURE     Status: None   Collection Time    09/20/13 11:34 PM  Result Value Ref Range Status   Specimen Description URINE, CATHETERIZED   Final   Special Requests NONE   Final   Culture  Setup Time     Final   Value: 09/21/2013 15:53     Performed at [facility]     Final   Value: NO GROWTH     Performed at [facility]   Culture     Final   Value: NO GROWTH     Performed at [facility]   Report Status 09/22/2013 FINAL   Final  MRSA PCR SCREENING     Status: Abnormal   Collection Time    09/21/13  3:36 AM      Result Value Ref Range Status   MRSA by PCR POSITIVE (*) NEGATIVE Final   Comment:            The GeneXpert MRSA Assay (FDA     approved for NASAL specimens     only), is one component of a     comprehensive MRSA colonization     surveillance program. It is not     intended to diagnose MRSA     infection nor to guide or     monitor treatment for     MRSA infections.     RESULT CALLED TO, READ BACK BY AND VERIFIED WITH:     CALLED TO RN [facility] [phone] @[phone]   CULTURE, ROUTINE-ABSCESS     Status: None   Collection Time    09/22/13  4:27 PM      Result Value Ref Range Status   Specimen Description ABSCESS BACK   Final   Special Requests     Final   Value: SPONTANEOUS EPIDURAL  PATIENT ON FOLLOWING VANC AND MAXIPINE   Gram Stain     Final   Value: RARE WBC PRESENT, PREDOMINANTLY MONONUCLEAR     NO SQUAMOUS EPITHELIAL CELLS SEEN     FEW GRAM POSITIVE COCCI IN PAIRS     IN CLUSTERS     Performed at [facility]   Culture     Final   Value: NO GROWTH     Performed at [facility]   Report Status PENDING   Incomplete  ANAEROBIC CULTURE     Status: None   Collection Time    09/22/13  4:27 PM      Result Value Ref Range Status   Specimen Description ABSCESS BACK   Final   Special Requests     Final   Value: SPONTANEOUS EPIDURAL PATIENT ON FOLLOWING VANC AND MAXIPINE   Gram Stain     Final   Value: RARE WBC PRESENT, PREDOMINANTLY MONONUCLEAR     NO SQUAMOUS EPITHELIAL CELLS SEEN     FEW GRAM POSITIVE COCCI IN PAIRS     IN CLUSTERS     Performed at [facility]   Culture     Final   Value: NO ANAEROBES ISOLATED; CULTURE IN PROGRESS FOR 5 DAYS     Performed at [facility]   Report Status PENDING   Incomplete   Assessment: Operative Gram stain reveals gram-positive cocci but cultures remained negative, possibly due to preoperative antibiotic therapy. I will continue vancomycin pending final cultures.  Plan: 1. Continue vancomycin  Michel Bickers, MD [facility] [phone] pager   [phone] cell 09/24/2013, 5:06 PM

## 2013-09-24 NOTE — Progress Notes (Signed)
Occupational Therapy Evaluation Patient Details Name: Autumn Duran MRN: [id number] DOB: [birth year] Today's Date: 09/24/2013    History of Present Illness 63 yo F with increasing R leg pain and lower back pain for past 2 days. History of multiple myeloma recently diagnosed and being treated via radiation. Also history of recent (May 2015) kyphoplasty and biopsy after pathologic L3 fracture. Biopsy showed plasmacytoma. Found on MRI of back to have discitis with overlying epidural abscess. Pt s/p Left L5-S1 hemilaminectomy with resection of epidural abscess on 7/27.   Clinical Impression   Patient presents with decreased ADL independence and safety. Will benefit from skilled OT to maximize independence and facilitate safe discharge.    Follow Up Recommendations  Home health OT;Supervision/Assistance - 24 hour    Equipment Recommendations  None recommended by OT (pt has BSC)    Recommendations for Other Services PT consult     Precautions / Restrictions Precautions Precautions: Fall;Back Precaution Booklet Issued: No Precaution Comments: pt re-educated on back precautions for comfort Restrictions Weight Bearing Restrictions: No      Mobility                      Balance                                       ADL Overall ADL's : Needs assistance/impaired Eating/Feeding: Independent;Sitting;Bed level   Grooming: Set up;Wash/dry hands;Wash/dry face;Sitting;Bed level   Upper Body Bathing: Set up;Sitting;Bed level   Lower Body Bathing: Minimal assistance                       Functional mobility during ADLs:  (limited eval due to pt fatigue from sitting up and PT) General ADL Comments: Patient reports she sat up in chair x 5 hours this am, bathed herself without difficulty, and worked with PT. She declined mobility on eval due to fatigue. Patient tearful about recent regression in function, stating she had been "doing so well" at home. Emotional  support provided. Patient getting up to [facility] with nursing staff.     Vision                     Perception     Praxis      Pertinent Vitals/Pain VSS     Hand Dominance Right   Extremity/Trunk Assessment Upper Extremity Assessment Upper Extremity Assessment: Generalized weakness   Lower Extremity Assessment Lower Extremity Assessment: Defer to PT evaluation   Cervical / Trunk Assessment Cervical / Trunk Assessment: Normal   Communication Communication Communication: No difficulties   Cognition Arousal/Alertness: Awake/alert Behavior During Therapy: WFL for tasks assessed/performed Overall Cognitive Status: Within Functional Limits for tasks assessed                     General Comments       Exercises       Shoulder Instructions      Home Living Family/patient expects to be discharged to:: Private residence Living Arrangements: Alone Available Help at Discharge: Family;Available 24 hours/day Type of Home: House Home Access: Level entry     Home Layout: Able to live on main level with bedroom/bathroom;Two level Alternate Level Stairs-Number of Steps: 14 Alternate Level Stairs-Rails: Right Bathroom Shower/Tub: Occupational psychologist: Standard     Home Equipment: Environmental consultant - 2 wheels;Bedside commode  Prior Functioning/Environment Level of Independence: Independent with assistive device(s)        Comments: pt uses cane, sister does grocery shopping    OT Diagnosis: Generalized weakness;Acute pain   OT Problem List: Decreased strength;Decreased activity tolerance;Decreased safety awareness;Pain   OT Treatment/Interventions: Self-care/ADL training;DME and/or AE instruction;Therapeutic activities;Patient/family education    OT Goals(Current goals can be found in the care plan section) Acute Rehab OT Goals Patient Stated Goal: to get better and go home OT Goal Formulation: With patient Time For Goal Achievement:  10/08/13 Potential to Achieve Goals: Good  OT Frequency: Min 2X/week   Barriers to D/C:            Co-evaluation              End of Session    Activity Tolerance: Patient limited by fatigue Patient left: in bed;with call bell/phone within reach   Time: 1010-1028 OT Time Calculation (min): 18 min Charges:  OT General Charges $OT Visit: 1 Procedure OT Evaluation $Initial OT Evaluation Tier I: 1 Procedure OT Treatments $Self Care/Home Management : 8-22 mins G-Codes:    Mairely Foxworth A 09/29/2013, 10:40 AM

## 2013-09-24 NOTE — Progress Notes (Signed)
Family Medicine Teaching Service Daily Progress Note Intern Pager: [phone]  Patient name: Autumn Duran Medical record number: [id number] Date of birth: Jun 30, 1950 Age: 63 y.o. Gender: female  Primary Care Provider: [name], MD Consultants: ID, Neurosurgery, CCM Code Status: Full  Pt Overview and Major Events to Date:  7/26: Admitted; Lumbar MRI with evidence of possible abscess/osteomyelitis; Neurosurg and ID consulted 7/27: Patient taken to OR by neurosurgery for hemilaminectomy and abscess removal 7/28: Gram stain of abscess fluid shows G(+) cocci in pairs/clusters; D/C cefepime; PICC line placement; Pharm on board 7/29: IV Dilaudid PRN DCd; PO pain management started  Assessment and Plan:  # Epidural abscess/osteomyelitis with (currently resolved) Sepsis- SIRS criteria (Tachycardia, tachypnea, fever); patient initially seen with temperature of 101.87F, pulse 110, respiratory rate 24. Patient recently completed radiation therapy for smoldering myeloma last week. MRI of Lumbar spine returned evidence of epidural abscess and discitis.  - Patient hypotensive on admission; CCM consulted and aware of patient status; 3 L normal saline bolused while in the ED; patient blood pressure currently stable. - Labs per Neurosurgery: ESR 7 (wnl); CRP 20.9 (H) - Neurosurgery brought into OR 7/27; removed purulent sac from L5 level; pt tolerated procedure well - Blood cultures and abscess cultures pending; Abscess Gram Stain>>G(+) cocci in pairs/clusters - Post-Op Day: 2 - Neurosurgery and ID consulted (we appreciate the recs) - IV vancomycin and Zosyn began in the ED>>Zosyn DCd--started Cefepime>>Cefepime DCd 7/28 (Vanc continues) - PICC line to be placed for long term abx  # Back and hip pain; pt has long history of radicular pain secondary to pathologic compression fracture of L3 in setting of plasmacytoma/smoldering myeloma.   - Lumbar MRI: revealed discitis/osteomyelitits L3-L5, with concern  signs of infection at L5-S1. Additionally, 1.1 x 1.1 x 1.5 cm loculated collection within the left ventral epidural space posterior to L5>>confermed epidural abscess.  - DCd Dilaudid PRN; restarted home pain regimen equivalent with additional PRN for continued pain.  - Will continue to monitor closely   # Smoldering myeloma; patient currently being followed by oncology on an outpatient basis. Patient found to have pathologic compression fracture in May of this year. Patient completed radiation therapy last week. We will continue to monitor this.  - Calcium 10.3 (corrected 11.34).  - IV fluids (s/p 3 NS boluses). Will continue IVF @ 125/hr.  # Intractable nausea; 1BM since admission - patient c/o significant Nausea x1 day - Zofran given w/ mild relief; Patient asking for omeprazole>>given protonix 6m - given Calcium Carbonate overnight w/ some relief.  - Maalox Q4H PRN order placed - Started Senokot-S 2 tabs QD for GI motility - Considered Lactobacilli due to significant antibiotic use; will leave up to ID   FEN/GI: heart healthy PPx: Lovenox  Disposition: Home with sister when medically stable  Subjective:  Patient is laying in bed awake, alert, and oriented. She states that she is still in pain. She had significant nausea and heaves last night. She is asking if she can bring in her own omeprazole from home. I informed her of the similarities between omeprazole and protonix (which she has received). She understands that she is allowed to bring in her home medications but would have to give them to the nurse and these medications would be given by/managed by the nursing staff.  Objective: Temp:  [97.9 F (36.6 C)-98.4 F (36.9 C)] 98.4 F (36.9 C) (07/29 0730) Pulse Rate:  [53-79] 74 (07/29 0730) Resp:  [13-19] 15 (07/29 0730) BP: (98-151)/(50-67) 99/58 mmHg (07/29  0730) SpO2:  [91 %-97 %] 95 % (07/29 0730) Physical Exam: General -- oriented x3, cooperative, NAD  HEENT -- Head is  normocephalic. Extraocular motions are intact.  Neck -- supple  Chest -- Lungs clear to auscultation.  Cardiac -- RRR. No murmurs noted.  Abdomen -- soft, nontender. No masses palpable. Normal bowel sounds present.  CNS -- cranial nerves II through XII grossly intact. Musculoskeletal - Tenderness at left lateral foot, and low-back. ROM good. 4/5 bilateral strength. Dorsalis pedis pulses present and symmetrical. Diminished dorsiflexion on left leg.   Laboratory:  Recent Labs Lab 09/22/13 0924 09/23/13 0313 09/24/13 0528  WBC 7.7 9.6 10.2  HGB 10.2* 9.1* 10.0*  HCT 33.1* 29.3* 32.6*  PLT 368 340 368    Recent Labs Lab 09/20/13 2200  09/22/13 0924 09/23/13 0313 09/24/13 0528  NA 137  --  134* 132* 139  K 4.8  --  4.2 5.5* 3.7  CL 102  --  99 103 99  CO2 25  --  _0 BUN 15  --  _1 CREATININE 0.65  < > 0.48* 0.50 0.65  CALCIUM 10.3  --  9.9 9.3 10.2  PROT 7.0  --   --   --   --   BILITOT 0.6  --   --   --   --   ALKPHOS 98  --   --   --   --   ALT 16  --   --   --   --   AST 16  --   --   --   --   GLUCOSE 116*  --  136* 144* 80  < > = values in this interval not displayed.  CRP: 20.9 (H) ESR: 7 (wnl) HIV: neg   Imaging/Diagnostic Tests: Lumbar MRI 7/26 IMPRESSION:  1. Findings concerning for osteomyelitis discitis at L2-3 and L3-4.  2. Abnormal edema and enhancement within the left pedicle of L5 and  about the left L5-S1 joint, also concerning for infection/septic  arthritis.  3. 1.1 x 1.1 x 1.5 cm loculated collection within the left ventral  epidural space posterior to L5, concerning for possible epidural  abscess. There is moderate central canal narrowing at this level.   Elberta Leatherwood, MD 09/24/2013, 10:33 AM PGY-1, [place] Intern pager: [phone], text pages welcome

## 2013-09-24 NOTE — Progress Notes (Signed)
Family Medicine Teaching Service  Interval Progress Note   MD paged about ongoing nausea and dry heaving. Patient noted that Protonix, zofran, Maalox, and Reglan were unsuccessful.   Notes the pain began after she had a few bites of BBQ last night. Pain is a burning sensation in her chest with no radiation. She has been dry heaving and before that had non-bloody bilious emesis. She has been able to keep Boost down however. She tells me that this is similar to her typical acid reflux and notes that only omeprazole helps which we haven't given her. Explained to patient that Protonix works in the same fashion.  She has had 2 BMs since her surgery on 7/27.     Filed Vitals:   09/24/13 0336  BP: 151/67  Pulse: 57  Temp: 98.1 F (36.7 C)  Resp: 19   Patient resting comfortably on her side with her eyes closed when I look from outside the door, once I enter she begins to lift herself up slightly with the bed handrail like she is uncomfortable with with position. +BS, non-distended. Mild tenderness over the epigastrium, otherwise a benign abdominal exam. No rebound/guarding.  A/P:  Initially given intractable nausea/dry heaving I was concerned for post-op ileus, however patient with +BS and +BMs since surgery.  Pt previously on omeprazole 40mg  QD. Patient had Protonix 40mg  at 1714 on 7/28. Let patient know we'd give her another dose of Protonix early and explained it works the exact same way. Also gave Tums.    Archie Patten, MD

## 2013-09-24 NOTE — Progress Notes (Signed)
I discussed with  Dr Alease Frame.  I agree with their plans documented in their progress note.

## 2013-09-24 NOTE — Evaluation (Signed)
Physical Therapy Evaluation Patient Details Name: Autumn Duran MRN: [id number] DOB: 1950/06/12 Today's Date: 09/24/2013   History of Present Illness  63 yo F with increasing R leg pain and lower back pain for past 2 days. History of multiple myeloma recently diagnosed and being treated via radiation. Also history of recent (May 2015) kyphoplasty and biopsy after pathologic L3 fracture. Biopsy showed plasmacytoma. Found on MRI of back to have discitis with overlying epidural abscess. Pt s/p Left L5-S1 hemilaminectomy with resection of epidural abscess on 7/27.  Clinical Impression  Pt presented with above. Pt mobility greatly limited by back pain and fatigue. Pt currently requires minA for transfers and ADLS and would require 24/7 supervision for safe d/c home. Pt reports sister to be going home with her.     Follow Up Recommendations Home health PT;Supervision/Assistance - 24 hour    Equipment Recommendations  Rolling walker with 5" wheels    Recommendations for Other Services       Precautions / Restrictions Precautions Precautions: Fall;Back Precaution Booklet Issued: No Precaution Comments: pt re-educated on back precautions for comfort Restrictions Weight Bearing Restrictions: No      Mobility  Bed Mobility Overal bed mobility: Needs Assistance Bed Mobility: Sit to Sidelying         Sit to sidelying: Min assist General bed mobility comments: pt up in chair upon PT arrival, assist for LE management back into bed  Transfers Overall transfer level: Needs assistance Equipment used: Rolling walker (2 wheeled) Transfers: Sit to/from Stand Sit to Stand: Min assist         General transfer comment: v/c's for safe hand placement, increased time  Ambulation/Gait Ambulation/Gait assistance: Min assist Ambulation Distance (Feet): 75 Feet Assistive device: Rolling walker (2 wheeled) Gait Pattern/deviations: Step-through pattern;Decreased stride length;Narrow base of  support Gait velocity: decreased due to pain/fatigue  Gait velocity interpretation: Below normal speed for age/gender General Gait Details: no episodes of LOB however decreased step height, L LE Limp, antalgic gait  Stairs            Wheelchair Mobility    Modified Rankin (Stroke Patients Only)       Balance           Standing balance support: During functional activity Standing balance-Leahy Scale: Poor Standing balance comment: pt requires RW for safe amb due to back pain                             Pertinent Vitals/Pain 7/10 back pain    Home Living Family/patient expects to be discharged to:: Private residence Living Arrangements: Alone Available Help at Discharge: Family;Available 24 hours/day (reports sister going home with her) Type of Home: House Home Access: Level entry     Home Layout: Able to live on main level with bedroom/bathroom;Two level Home Equipment: Walker - 2 wheels;Bedside commode      Prior Function Level of Independence: Independent with assistive device(s)         Comments: pt uses cane, sister does grocery shopping     Hand Dominance   Dominant Hand: Right    Extremity/Trunk Assessment   Upper Extremity Assessment: Generalized weakness (due to back pain)           Lower Extremity Assessment: Generalized weakness (due to back pain)      Cervical / Trunk Assessment: Normal  Communication   Communication: No difficulties  Cognition Arousal/Alertness: Awake/alert Behavior During Therapy: WFL for tasks assessed/performed  Overall Cognitive Status: Within Functional Limits for tasks assessed                      General Comments      Exercises        Assessment/Plan    PT Assessment Patient needs continued PT services  PT Diagnosis Difficulty walking;Acute pain   PT Problem List Decreased strength;Decreased activity tolerance;Decreased balance;Decreased mobility;Decreased  cognition;Decreased knowledge of use of DME;Decreased safety awareness;Decreased knowledge of precautions;Pain  PT Treatment Interventions DME instruction;Gait training;Stair training;Functional mobility training;Therapeutic activities;Therapeutic exercise;Balance training;Neuromuscular re-education;Cognitive remediation;Patient/family education;Modalities   PT Goals (Current goals can be found in the Care Plan section) Acute Rehab PT Goals Patient Stated Goal: to get better and go home PT Goal Formulation: With patient Time For Goal Achievement: 10/01/13 Potential to Achieve Goals: Good    Frequency Min 5X/week   Barriers to discharge        Co-evaluation               End of Session Equipment Utilized During Treatment: Gait belt Activity Tolerance: Patient limited by fatigue;Patient limited by pain Patient left: in bed;with call bell/phone within reach;with bed alarm set Nurse Communication: Mobility status         Time: 0921-0940 PT Time Calculation (min): 19 min   Charges:   PT Evaluation $Initial PT Evaluation Tier I: 1 Procedure PT Treatments $Gait Training: 8-22 mins   PT G CodesKingsley Callander 09/24/2013, 10:36 AM Kittie Plater, PT, DPT Pager #: [phone] Office #: [phone]

## 2013-09-24 NOTE — Progress Notes (Signed)
Peripherally Inserted Central Catheter/Midline Placement  The IV Nurse has discussed with the patient and/or persons authorized to consent for the patient, the purpose of this procedure and the potential benefits and risks involved with this procedure.  The benefits include less needle sticks, lab draws from the catheter and patient may be discharged home with the catheter.  Risks include, but not limited to, infection, bleeding, blood clot (thrombus formation), and puncture of an artery; nerve damage and irregular heat beat.  Alternatives to this procedure were also discussed.  PICC/Midline Placement Documentation  PICC / Midline Single Lumen 50/56/97 PICC Right Basilic 44 cm 5 cm (Active)  Indication for Insertion or Continuance of Line Home intravenous therapies (PICC only) 09/24/2013  1:00 PM  Exposed Catheter (cm) 5 cm 09/24/2013  1:00 PM  Dressing Change Due 10/01/13 09/24/2013  1:00 PM       Jule Economy Horton 09/24/2013, 1:03 PM

## 2013-09-25 DIAGNOSIS — C9000 Multiple myeloma not having achieved remission: Secondary | ICD-10-CM | POA: Diagnosis not present

## 2013-09-25 DIAGNOSIS — IMO0002 Reserved for concepts with insufficient information to code with codable children: Secondary | ICD-10-CM | POA: Diagnosis not present

## 2013-09-25 DIAGNOSIS — A4902 Methicillin resistant Staphylococcus aureus infection, unspecified site: Secondary | ICD-10-CM

## 2013-09-25 DIAGNOSIS — G061 Intraspinal abscess and granuloma: Secondary | ICD-10-CM | POA: Diagnosis not present

## 2013-09-25 DIAGNOSIS — A419 Sepsis, unspecified organism: Secondary | ICD-10-CM | POA: Diagnosis not present

## 2013-09-25 LAB — BASIC METABOLIC PANEL
Anion gap: 8 (ref 5–15)
BUN: 16 mg/dL (ref 6–23)
CO2: 28 mEq/L (ref 19–32)
Calcium: 9.3 mg/dL (ref 8.4–10.5)
Chloride: 101 mEq/L (ref 96–112)
Creatinine, Ser: 0.51 mg/dL (ref 0.50–1.10)
GFR calc Af Amer: >90 mL/min (ref >90)
GFR calc non Af Amer: >90 mL/min (ref >90)
Glucose, Bld: 74 mg/dL (ref 70–99)
Potassium: 3.8 mEq/L (ref 3.7–5.3)
Sodium: 137 mEq/L (ref 137–147)

## 2013-09-25 LAB — CULTURE, ROUTINE-ABSCESS

## 2013-09-25 LAB — CBC
HCT: 28.1 % — ABNORMAL LOW (ref 36.0–46.0)
Hemoglobin: 8.7 g/dL — ABNORMAL LOW (ref 12.0–15.0)
MCH: 25.4 pg — ABNORMAL LOW (ref 26.0–34.0)
MCHC: 31 g/dL (ref 30.0–36.0)
MCV: 81.9 fL (ref 78.0–100.0)
Platelets: 292 10*3/uL (ref 150–400)
RBC: 3.43 MIL/uL — ABNORMAL LOW (ref 3.87–5.11)
RDW: 15.7 % — ABNORMAL HIGH (ref 11.5–15.5)
WBC: 7.6 10*3/uL (ref 4.0–10.5)

## 2013-09-25 NOTE — Progress Notes (Signed)
Agree with note 09/25/13 Time:10:41-11:15 2 [place]  [facility], OTR/L  [phone] 09/25/2013

## 2013-09-25 NOTE — Progress Notes (Signed)
[place] that are Medicare-Certified and affiliated with [facility]  Telephone Number Address  [place] has ownership interest in this company; however, you are under no obligation to use this agency. [phone]  [address], [place] [address]    Agencies that are Medicare-Certified and are not affiliated with [facility] Telephone Number Address  [facility] [phone] Fax [phone] [place], [place]  [address]  [facility] [phone] [address] [place], [place] [address]  [facility]  [phone] Fax [phone] [address], [place], [place]  [address]  [facility] [phone] or [phone] [address] [place], [place] [address]  [facility] [phone] or [phone] [address] [place], [place]  [address]      Agencies that are not Medicare-Certified and are not affiliated with [facility] Telephone Number Address  [facility] [place] [phone] Fax [phone] [address] [place], [place]  [address]  [facility] [phone] or [phone] Fax [phone] [address], [place], [place]  [address]  [facility]  [phone] [address] [place], [place] [phone] Fax [phone] [address] [place], [place]  [address]  [place]. [phone] Fax [phone] [address] [place], [place]  [address]  [facility] [phone] [address] [place], [place]  [address]  [facility] [place] Fax [phone] [address] [place], [place] [address]  [place]. [phone] [address], [place], [place]  [address]  [facility] [phone] Fax [phone] [address] [place], [place]  [address]

## 2013-09-25 NOTE — Progress Notes (Signed)
Patient ID: Autumn Duran, female   DOB: 03/17/1950, 63 y.o.   MRN: [id number]         [facility] for Infectious Disease    Date of Admission:  09/20/2013           Day 6 vancomycin Principal Problem:   Sepsis Active Problems:   Compression fracture   Hypercalcemia   Smoldering myeloma   . antiseptic oral rinse  15 mL Mouth Rinse BID  . Chlorhexidine Gluconate Cloth  6 each Topical Q0600  . dexamethasone  4 mg Oral Daily  . feeding supplement  1 Container Oral TID BM  . mupirocin ointment  1 application Nasal BID  . OxyCODONE  20 mg Oral Q12H  . senna-docusate  2 tablet Oral Daily  . sodium chloride  10-40 mL Intracatheter Q12H  . vancomycin  750 mg Intravenous Q12H    Subjective: She is feeling a little bit better. She has been up walking in the hallway. Her pain is down to 4/10 from 8/10 on admission.  Past Medical History  Diagnosis Date  . Diverticulosis   . Hemorrhoids   . Hx of adenomatous colonic polyps   . Hiatal hernia   . Hypertension   . Hyperlipidemia   . GERD (gastroesophageal reflux disease)   . Chronic kidney disease     kidney stone s/p stent placement ( removed)  . Complication of anesthesia   . PONV (postoperative nausea and vomiting)   . Bronchitis, chronic   . Chronic back pain   . Lumbar radicular pain   . Compression fracture of L3 lumbar vertebra   . T7 vertebral fracture   . Smoldering myeloma     History  Substance Use Topics  . Smoking status: Never Smoker   . Smokeless tobacco: Never Used  . Alcohol Use: Yes     Comment: social occ    Family History  Problem Relation Age of Onset  . Breast cancer Sister 17  . Breast cancer Other 68    aunt    Allergies  Allergen Reactions  . Morphine And Related Other (See Comments)    Headache  . Tetracyclines & Related Nausea And Vomiting    Objective: Temp:  [97.5 F (36.4 C)-98.7 F (37.1 C)] 98 F (36.7 C) (07/30 1131) Pulse Rate:  [55-77] 77 (07/30 1131) Resp:  [11-18]  18 (07/30 1131) BP: (97-137)/(44-75) 101/44 mmHg (07/30 1131) SpO2:  [91 %-98 %] 98 % (07/30 1131)  General: She is smiling and in good spirits visiting with family Skin: New right arm PICC site appears normal  Lab Results Lab Results  Component Value Date   WBC 7.6 09/25/2013   HGB 8.7* 09/25/2013   HCT 28.1* 09/25/2013   MCV 81.9 09/25/2013   PLT 292 09/25/2013    Lab Results  Component Value Date   CREATININE 0.51 09/25/2013   BUN 16 09/25/2013   NA 137 09/25/2013   K 3.8 09/25/2013   CL 101 09/25/2013   CO2 28 09/25/2013    Lab Results  Component Value Date   ALT 16 09/20/2013   AST 16 09/20/2013   ALKPHOS 98 09/20/2013   BILITOT 0.6 09/20/2013      Microbiology: Recent Results (from the past 240 hour(s))  CULTURE, BLOOD (ROUTINE X 2)     Status: None   Collection Time    09/20/13 10:00 PM      Result Value Ref Range Status   Specimen Description BLOOD RIGHT ARM   Final  Special Requests BOTTLES DRAWN AEROBIC AND ANAEROBIC 10CC   Final   Culture  Setup Time     Final   Value: 09/21/2013 14:50     Performed at [facility]   Culture     Final   Value:        BLOOD CULTURE RECEIVED NO GROWTH TO DATE CULTURE WILL BE HELD FOR 5 DAYS BEFORE ISSUING A FINAL NEGATIVE REPORT     Performed at [facility]   Report Status PENDING   Incomplete  CULTURE, BLOOD (ROUTINE X 2)     Status: None   Collection Time    09/20/13 10:41 PM      Result Value Ref Range Status   Specimen Description BLOOD LEFT ARM   Final   Special Requests BOTTLES DRAWN AEROBIC ONLY 4 CC   Final   Culture  Setup Time     Final   Value: 09/21/2013 14:50     Performed at [facility]   Culture     Final   Value:        BLOOD CULTURE RECEIVED NO GROWTH TO DATE CULTURE WILL BE HELD FOR 5 DAYS BEFORE ISSUING A FINAL NEGATIVE REPORT     Performed at [facility]   Report Status PENDING   Incomplete  URINE CULTURE     Status: None   Collection Time    09/20/13 11:34 PM       Result Value Ref Range Status   Specimen Description URINE, CATHETERIZED   Final   Special Requests NONE   Final   Culture  Setup Time     Final   Value: 09/21/2013 15:53     Performed at [facility]     Final   Value: NO GROWTH     Performed at [facility]   Culture     Final   Value: NO GROWTH     Performed at [facility]   Report Status 09/22/2013 FINAL   Final  MRSA PCR SCREENING     Status: Abnormal   Collection Time    09/21/13  3:36 AM      Result Value Ref Range Status   MRSA by PCR POSITIVE (*) NEGATIVE Final   Comment:            The GeneXpert MRSA Assay (FDA     approved for NASAL specimens     only), is one component of a     comprehensive MRSA colonization     surveillance program. It is not     intended to diagnose MRSA     infection nor to guide or     monitor treatment for     MRSA infections.     RESULT CALLED TO, READ BACK BY AND VERIFIED WITH:     CALLED TO RN [facility] [phone] @[phone]   CULTURE, ROUTINE-ABSCESS     Status: None   Collection Time    09/22/13  4:27 PM      Result Value Ref Range Status   Specimen Description ABSCESS BACK   Final   Special Requests     Final   Value: SPONTANEOUS EPIDURAL PATIENT ON FOLLOWING VANC AND MAXIPINE   Gram Stain     Final   Value: RARE WBC PRESENT, PREDOMINANTLY MONONUCLEAR     NO SQUAMOUS EPITHELIAL CELLS SEEN     FEW GRAM POSITIVE COCCI IN PAIRS     IN CLUSTERS  Performed at [facility]     Final   Value: FEW METHICILLIN RESISTANT STAPHYLOCOCCUS AUREUS     Note: FIFS CRITICAL RESULT CALLED TO, READ BACK BY AND VERIFIED WITH: RN IN RM 15 BY INGRAM A 1015AM 09/25/13     Performed at [facility]   Report Status 09/25/2013 FINAL   Final   Organism ID, Bacteria METHICILLIN RESISTANT STAPHYLOCOCCUS AUREUS   Final  ANAEROBIC CULTURE     Status: None   Collection Time    09/22/13  4:27 PM      Result Value Ref Range Status   Specimen  Description ABSCESS BACK   Final   Special Requests     Final   Value: SPONTANEOUS EPIDURAL PATIENT ON FOLLOWING VANC AND MAXIPINE   Gram Stain     Final   Value: RARE WBC PRESENT, PREDOMINANTLY MONONUCLEAR     NO SQUAMOUS EPITHELIAL CELLS SEEN     FEW GRAM POSITIVE COCCI IN PAIRS     IN CLUSTERS     Performed at [facility]   Culture     Final   Value: NO ANAEROBES ISOLATED; CULTURE IN PROGRESS FOR 5 DAYS     Performed at [facility]   Report Status PENDING   Incomplete   Assessment: She has MRSA lumbar infection and is improving after surgery. I will plan on continuing vancomycin for a minimum of 6 weeks through September 5.  Plan: 1. Continue IV vancomycin through September 5 2. I will arrange followup in my clinic in about 3 weeks 3. I will sign off now please call me if I can be of further assistance  Michel Bickers, MD [facility] [place] [phone] pager   [phone] cell 09/25/2013, 12:13 PM

## 2013-09-25 NOTE — Progress Notes (Signed)
Occupational Therapy Treatment and Discharge Patient Details Name: Autumn Duran MRN: [id number] DOB: Feb 22, 1951 Today's Date: 09/25/2013    History of present illness 63 yo F with increasing R leg pain and lower back pain for past 2 days. History of multiple myeloma recently diagnosed and being treated via radiation. Also history of recent (May 2015) kyphoplasty and biopsy after pathologic L3 fracture. Biopsy showed plasmacytoma. Found on MRI of back to have discitis with overlying epidural abscess. Pt s/p Left L5-S1 hemilaminectomy with resection of epidural abscess on 7/27.   OT comments  Focus of session was on educating pt on home safety, LB bathing/dressing compensatory strategies and AE to assist with toileting. All acute education has been completed and pt would benefit from f/u of HHOT to maximize independence at home with 24/7 supervision. We will sign off.  Follow Up Recommendations  Home health OT;Supervision/Assistance - 24 hour    Equipment Recommendations   (Pt has all DME needs)       Precautions / Restrictions Precautions Precautions: Fall;Back Precaution Booklet Issued: No Precaution Comments: pt re-educated on back precautions for comfort Restrictions Weight Bearing Restrictions: No       Mobility Bed Mobility Overal bed mobility: Needs Assistance Bed Mobility: Sit to Sidelying;Rolling Rolling: Supervision Sidelying to sit: Supervision     Sit to sidelying: Supervision General bed mobility comments: Good technique with no VC needed for log rolling and sit>sidelying.  Transfers Overall transfer level: Needs assistance Equipment used: Rolling walker (2 wheeled) Transfers: Sit to/from Stand Sit to Stand: Supervision         General transfer comment: requires increased time    Balance Overall balance assessment: Needs assistance Sitting-balance support: Feet supported;No upper extremity supported Sitting balance-Leahy Scale: Good     Standing  balance support: During functional activity Standing balance-Leahy Scale: Fair                     ADL Overall ADL's : Needs assistance/impaired     Grooming: Wash/dry hands;Wash/dry face;Standing;Supervision/safety;Set up               Lower Body Dressing: Supervision/safety;Sit to/from stand   Toilet Transfer: BSC;RW;Ambulation;Supervision/safety   Toileting- Water quality scientist and Hygiene: Sit to/from stand;Cueing for back precautions;Supervision/safety       Functional mobility during ADLs: Supervision/safety;Rolling walker General ADL Comments: Educated pt on toilet aide to assist with toileting, the use of 2 cups for oral car (one for spitting and one for rinsing), to have someone bring all her commonly used items to a countertop level and home safety tips. Pt demonstrated LB dressing by crossing one leg over the other and reaching her sock without difficulty.                 Cognition   Behavior During Therapy: WFL for tasks assessed/performed Overall Cognitive Status: Within Functional Limits for tasks assessed                       Extremity/Trunk Assessment          Cervical / Trunk Assessment Cervical / Trunk Assessment: Normal           Pertinent Vitals/ Pain       No c/o pain.  Home Living Family/patient expects to be discharged to:: Private residence Living Arrangements: Alone Available Help at Discharge: Family;Available 24 hours/day (reports sister going home with her) Type of Home: House Home Access: Level entry     Home Layout: Able  to live on main level with bedroom/bathroom;Two level Alternate Level Stairs-Number of Steps: 14 Alternate Level Stairs-Rails: Right           Home Equipment: Walker - 2 wheels;Bedside commode          Prior Functioning/Environment Level of Independence: Independent with assistive device(s)        Comments: pt uses cane, sister does grocery shopping   Frequency Min 2X/week      Progress Toward Goals  OT Goals(current goals can now be found in the care plan section)  Progress towards OT goals:  (All education completed)  Acute Rehab OT Goals Patient Stated Goal: to get better and go home OT Goal Formulation: With patient Time For Goal Achievement: 10/08/13 Potential to Achieve Goals: Good  Plan Discharge plan remains appropriate       End of Session Equipment Utilized During Treatment: Rolling walker   Activity Tolerance Patient tolerated treatment well   Patient Left in bed;with call bell/phone within reach           Time:  -     Charges:    Lyda Perone 09/25/2013, 11:30 AM

## 2013-09-25 NOTE — Progress Notes (Signed)
Family Medicine Teaching Service Daily Progress Note Intern Pager: [phone]  Patient name: Autumn Duran Medical record number: [id number] Date of birth: 1950/12/19 Age: 63 y.o. Gender: female  Primary Care Provider: [name], MD Consultants: ID, Neurosurgery, CCM Code Status: Full  Pt Overview and Major Events to Date:  7/26: Admitted; Lumbar MRI with evidence of possible abscess/osteomyelitis; Neurosurg and ID consulted 7/27: Patient taken to OR by neurosurgery for hemilaminectomy and abscess removal 7/28: Gram stain of abscess fluid shows G(+) cocci in pairs/clusters; D/C cefepime; Pharm on board 7/29: IV Dilaudid PRN DCd; PO pain management started; PICC line placed  Assessment and Plan:  # Epidural abscess/osteomyelitis with (currently resolved) Sepsis- SIRS criteria (Tachycardia, tachypnea, fever); patient initially seen with temperature of 101.51F, pulse 110, respiratory rate 24. Patient recently completed radiation therapy for smoldering myeloma last week. MRI of Lumbar spine returned evidence of epidural abscess and discitis.  - Patient hypotensive on admission; CCM consulted and aware of patient status; 3 L normal saline bolused while in the ED; patient blood pressure currently stable. - Labs per Neurosurgery: ESR 7 (wnl); CRP 20.9 (H) - Neurosurgery brought into OR 7/27; removed purulent sac from L5 level; pt tolerated procedure well - Blood cultures and abscess cultures (negative; suspect due to empiric abx treatment); Abscess Gram Stain>>G(+) cocci in pairs/clusters - Post-Op Day: 3 - Neurosurgery and ID consulted (we appreciate the recs) - IV vancomycin and Zosyn began in the ED>>Zosyn DCd--started Cefepime>>Cefepime DCd 7/28 (Vanc continues) - PICC line placed 7/29 for long term abx  # Back and hip pain; pt has long history of radicular pain secondary to pathologic compression fracture of L3 in setting of plasmacytoma/smoldering myeloma.   - Lumbar MRI: revealed  discitis/osteomyelitits L3-L5, with concern signs of infection at L5-S1. Additionally, 1.1 x 1.1 x 1.5 cm loculated collection within the left ventral epidural space posterior to L5>>confermed epidural abscess.  - DCd Dilaudid PRN; restarted home pain regimen equivalent with additional PRN for continued pain. -- Patient reports better pain management - Will continue to monitor  # Smoldering myeloma; patient currently being followed by oncology on an outpatient basis. Patient found to have pathologic compression fracture in May of this year. Patient completed radiation therapy last week. We will continue to monitor this.  - Calcium 10.3 (corrected 11.34).  - IV fluids (s/p 3 NS boluses). Will continue IVF @ 125/hr.  # Intractable nausea; 1BM since admission - patient c/o significant Nausea x1 day - Zofran given w/ mild relief; Patient asking for omeprazole>>given protonix 86m - given Calcium Carbonate overnight w/ some relief.  - Maalox Q4H PRN order placed - Senokot-S 2 tabs QD for GI motility - Considered Lactobacilli due to significant antibiotic use; will leave up to ID   FEN/GI: heart healthy PPx: Lovenox  Disposition: Home with sister when medically stable  Subjective:  Patient is laying in bed awake, alert, and oriented. She reports no additional Nausea. Pain is better controlled on PO medications. PICC placed -- Patient pleased that its "not as bad as I expected". Patient in good spirits and urged to mobilize throughout the day w/ help from nursing staff.  Objective: Temp:  [97.5 F (36.4 C)-98.7 F (37.1 C)] 97.8 F (36.6 C) (07/30 0716) Pulse Rate:  [55-71] 55 (07/30 0739) Resp:  [11-18] 11 (07/30 0739) BP: (97-137)/(50-75) 137/62 mmHg (07/30 0716) SpO2:  [91 %-97 %] 97 % (07/30 0739) Physical Exam: General -- oriented x3, cooperative, NAD  HEENT -- Head is normocephalic. Extraocular motions are  intact.  Neck -- supple  Chest -- Lungs clear to auscultation.  Cardiac  -- RRR. No murmurs noted.  Abdomen -- soft, nontender. No masses palpable. Normal bowel sounds present.  CNS -- cranial nerves II through XII grossly intact. Musculoskeletal - Tenderness at left lateral foot, and low-back. ROM good. 4/5 bilateral strength. Dorsalis pedis pulses present and symmetrical. Diminished strength in left leg continues with dorsiflexion and extensor hallucis.   Laboratory:  Recent Labs Lab 09/23/13 0313 09/24/13 0528 09/25/13 0445  WBC 9.6 10.2 7.6  HGB 9.1* 10.0* 8.7*  HCT 29.3* 32.6* 28.1*  PLT 340 368 292    Recent Labs Lab 09/20/13 2200  09/23/13 0313 09/24/13 0528 09/25/13 0445  NA 137  < > 132* 139 137  K 4.8  < > 5.5* 3.7 3.8  CL 102  < > 103 99 101  CO2 25  < > '22 29 28  ' BUN 15  < > '19 23 16  ' CREATININE 0.65  < > 0.50 0.65 0.51  CALCIUM 10.3  < > 9.3 10.2 9.3  PROT 7.0  --   --   --   --   BILITOT 0.6  --   --   --   --   ALKPHOS 98  --   --   --   --   ALT 16  --   --   --   --   AST 16  --   --   --   --   GLUCOSE 116*  < > 144* 80 74  < > = values in this interval not displayed.  CRP: 20.9 (H) ESR: 7 (wnl) HIV: neg   Imaging/Diagnostic Tests: Lumbar MRI 7/26 IMPRESSION:  1. Findings concerning for osteomyelitis discitis at L2-3 and L3-4.  2. Abnormal edema and enhancement within the left pedicle of L5 and  about the left L5-S1 joint, also concerning for infection/septic  arthritis.  3. 1.1 x 1.1 x 1.5 cm loculated collection within the left ventral  epidural space posterior to L5, concerning for possible epidural  abscess. There is moderate central canal narrowing at this level.   Elberta Leatherwood, MD 09/25/2013, 8:34 AM PGY-1, [place] Intern pager: [phone], text pages welcome

## 2013-09-25 NOTE — Discharge Summary (Signed)
[facility] Discharge Summary  Patient name: Autumn Duran Medical record number: [id number] Date of birth: 1950-06-28 Age: 63 y.o. Gender: female Date of Admission: 09/20/2013  Date of Discharge: 09/26/2013  Admitting Physician: Alveda Reasons, MD  Primary Care Provider: [name], MD Consultants: Neurosurgery, Infectious Disease  Indication for Hospitalization: Right leg pain and fever  Discharge Diagnoses/Problem List:  - Epidural abscess; MRSA - Sepsis - Status post L5-S1 hemilaminectomy - Smoldering myeloma - Intractable nausea  Disposition: Home with family and home health care services  Discharge Condition: Medically stated  Discharge Exam:  General -- oriented x3, cooperative, NAD  HEENT -- Head is normocephalic. Extraocular motions are intact.  Neck -- supple  Chest -- Lungs clear to auscultation.  Cardiac -- RRR. No murmurs noted.  Abdomen -- soft, nontender. No masses palpable. Normal bowel sounds present.  CNS -- cranial nerves II through XII grossly intact.  Musculoskeletal - Mild tenderness at left lateral foot, and low-back. ROM good. 4/5 bilateral strength. Dorsalis pedis pulses present and symmetrical. Diminished strength in left leg continues (with some improvement) with dorsiflexion and extensor hallucis.   Brief Hospital Course:  Patient is a 63 year old female who presented with severe hip and back pain was found to have fever upon admission. Patient stated that the hip and back pain was localized primarily to her left side and radiated down the lateral aspect of her foot. This pain was reported as extremely debilitating to the point where she was sleeping on her floor, which is closer to the entrance of her bathroom. Patient has a history significant for smoldering myeloma of her third lumbar vertebra. She received treatment in the form of radiation therapy for this and recently completed this treatment one to 2 weeks prior  to admission. Patient stated that this pain was progressive in nature and finally got to the point where she felt it necessary to take herself to the hospital.  Patient was admitted from the ED for her left leg and back pain, as well as a low-grade fever. She was placed on 2 L nasal cannula for O2 supplementation, IV Dilaudid for pain management, and started on empiric therapy of IV vancomycin and Zosyn. An MRI of her lumbar spine was taken and found evidence concerning of osteomyelitis discitis at L2-3 and L3-4, additional findings included an approximately 1 cm cubed loculated collection of fluid within the left ventral epidural space posterior to L5. This capsule was concerning for a possible epidural abscess. Neurosurgery and infectious disease were consulted immediately after this finding. Infectious disease discontinued her Zosyn and started her on cefepime with the vancomycin. Neurosurgery and eventually opted for patient to be brought into the OR. A hemilaminectomy and excision of the abscess was performed on 7/27.  Patient responded to surgery very well. She had continued left lower extremity pain. This pain gradually dissipated over time however he was still present at the time of discharge. Gram stains of the abscess fluid showed gram-positive cocci in pairs and clusters. At this time infectious disease discontinued the cefepime (7/28). Bacterial cultures eventually showed MRSA growth. The following day patient was scheduled for PICC line placement due to the necessity of long-term antibiotic therapy. Patient was also changed to oral pain management and her IV Dilaudid was discontinued. Patient tolerated these transitioned smoothly.   At the time of discharge patient was in significantly less pain and very pleased with her overall care. Followup appointments with infectious disease as an outpatient has been scheduled  and their office will contact the patient. Patient has been advised to schedule a  followup appointment with her primary care provider. Home health services on board and will be assisting the patient as she transitions out of the hospital into her own home with her PICC line in place and IV antibiotics regularly scheduled.   Issues for Follow Up:  - Discharging with PICC line and orders for vancomycin twice a day; to receive home health services and assistance upon discharge - Current chronic weakness in left lower extremity; have discussed with patient about possibility of long-term weakness/foot drop continue to monitor. - Followup with infectious disease clinic in 3 weeks - Followup with Heme/Onc physician for management of smoldering myeloma. - Followup with primary care physician for health management and long-term care  Significant Procedures: L5-S1 hemilaminectomy with excision of epidural abscess  Significant Labs and Imaging:   Recent Labs Lab 09/24/13 0528 09/25/13 0445 09/26/13 0515  WBC 10.2 7.6 8.1  HGB 10.0* 8.7* 9.2*  HCT 32.6* 28.1* 29.2*  PLT 368 292 280    Recent Labs Lab 09/20/13 2200  09/22/13 0924 09/23/13 0313 09/24/13 0528 09/25/13 0445 09/26/13 0515  NA 137  --  134* 132* 139 137 137  K 4.8  --  4.2 5.5* 3.7 3.8 3.7  CL 102  --  99 103 99 101 102  CO2 25  --  26 22 29 28 28   GLUCOSE 116*  --  136* 144* 80 74 76  BUN 15  --  14 19 23 16 11   CREATININE 0.65  < > 0.48* 0.50 0.65 0.51 0.53  CALCIUM 10.3  --  9.9 9.3 10.2 9.3 9.7  ALKPHOS 98  --   --   --   --   --   --   AST 16  --   --   --   --   --   --   ALT 16  --   --   --   --   --   --   ALBUMIN 2.7*  --   --   --   --   --   --   < > = values in this interval not displayed. MRI lumbar spine 7/28 IMPRESSION:  1. Findings concerning for osteomyelitis discitis at L2-3 and L3-4.  2. Abnormal edema and enhancement within the left pedicle of L5 and  about the left L5-S1 joint, also concerning for infection/septic  arthritis.  3. 1.1 x 1.1 x 1.5 cm loculated collection  within the left ventral  epidural space posterior to L5, concerning for possible epidural  abscess. There is moderate central canal narrowing at this level.    Results/Tests Pending at Time of Discharge: None  Discharge Medications:    Medication List         cyclobenzaprine 5 MG tablet  Commonly known as:  FLEXERIL  Take 1 tablet (5 mg total) by mouth 3 (three) times daily as needed for muscle spasms.     dexamethasone 4 MG tablet  Commonly known as:  DECADRON  Take 4 mg by mouth daily.     diazepam 5 MG tablet  Commonly known as:  VALIUM  Take 2.5 mg by mouth every 8 (eight) hours as needed for anxiety.     feeding supplement Liqd  Take 1 Container by mouth 3 (three) times daily between meals.     HYDROcodone-acetaminophen 10-325 MG per tablet  Commonly known as:  NORCO  Take 1 tablet  by mouth every 6 (six) hours as needed for moderate pain.     omeprazole 40 MG capsule  Commonly known as:  PRILOSEC  Take 40 mg by mouth daily as needed (acid reflux).     OxyCODONE 20 mg T12a 12 hr tablet  Commonly known as:  OXYCONTIN  Take 1 tablet (20 mg total) by mouth every 12 (twelve) hours.     oxyCODONE 5 MG immediate release tablet  Commonly known as:  Oxy IR/ROXICODONE  Take 1 tablet (5 mg total) by mouth every 6 (six) hours as needed for moderate pain or severe pain.     potassium chloride SA 20 MEQ tablet  Commonly known as:  K-DUR,KLOR-CON  Take 1 tablet (20 mEq total) by mouth 2 (two) times daily.     senna-docusate 8.6-50 MG per tablet  Commonly known as:  Senokot-S  Take 2 tablets by mouth daily.     Vancomycin 750 MG/150ML Soln  Commonly known as:  VANCOCIN  Inject 150 mLs (750 mg total) into the vein every 12 (twelve) hours.        Discharge Instructions: Please refer to Patient Instructions section of EMR for full details.  Patient was counseled important signs and symptoms that should prompt return to medical care, changes in medications, dietary  instructions, activity restrictions, and follow up appointments.   Follow-Up Appointments: Follow-up Information   Follow up with [place]- PT/OT/RN (IV abx) arranged)    Contact information:   [address] [place] [address] [phone]       Follow up with [name],[name], MD. Schedule an appointment as soon as possible for a visit in 1 week.   Specialty:  Internal Medicine   Contact information:   [place] [address] [phone]       Elberta Leatherwood, MD 09/26/2013, 1:59 PM PGY-1, [place]

## 2013-09-25 NOTE — Progress Notes (Signed)
Overall feels better. Complains of some back pain. Denies any radiating pain. Left leg still feels a little weak. Able to ambulate in the hall yesterday.  Afebrile vitals are stable. Dressing clean and dry. Motor 5/5 bilaterally except left EHL 3/5 left anterior tibialis 4/5.  Doing reasonably well. Continue IV antibiotics. Continue efforts at mobilization.

## 2013-09-25 NOTE — Care Management Note (Addendum)
    Page 1 of 2   09/29/2013     10:32:35 AM CARE MANAGEMENT NOTE 09/29/2013  Patient:  Autumn Duran, Autumn Duran   Account Number:  [id number]  Date Initiated:  09/24/2013  Documentation initiated by:  Marvetta Gibbons  Subjective/Objective Assessment:   Pt admitted with epidural abscess s/p lumbar laminotomy and evacuation of epidural abscess     Action/Plan:   PTA pt lived at home- PT/OT evals ordered will follow for recommendations   Anticipated DC Date:  09/26/2013   Anticipated DC Plan:  [place]  CM consult      [place]   Choice offered to / List presented to:  C-1 Patient   DME arranged  Vassie Moselle      DME agency  [place] arranged  HH-1 RN  IV Antibiotics  HH-2 PT  HH-3 OT      [place].   Status of service:  Completed, signed off Medicare Important Message given?  NO (If response is "NO", the following Medicare IM given date fields will be blank) Date Medicare IM given:   Medicare IM given by:   Date Additional Medicare IM given:   Additional Medicare IM given by:    Discharge Disposition:  [place]  Per UR Regulation:  Reviewed for med. necessity/level of care/duration of stay  If discussed at [place] of Stay Meetings, dates discussed:   09/24/2013    Comments:  Medicare IM- N/A  09/26/13- 1000- Marvetta Gibbons RN, BSN [phone] Confirmed with pharmacy dose timing for iv abx for second dose today- earliest that dose can be given is 7:00 bedside RN aware - [facility] also notified of dosing time - pt aware and has let family know that she will be ready for discharge around 8 pm this evening. [facility] services to begin in AM 09/27/13 -   09/25/13- 1100- Marvetta Gibbons RN BSN [phone] Pt with orders for HH-PT/OT- pt also has had PICC placed for long term IV abx- ID following for abx dosing for discharge- in to speak  with pt at bedside- list of [facility] agencies given to pt for [facility]- per pt she has used [facility] in the past and would like to use them again for services (she had Ronalee Belts- with [facility] for PT and if available would like again)- RW for home has been ordered also- [facility] to deliver to room prior to discharge- referral called to [place] with [facility] for HH-PT/OT - RN will also be needed for IV abxColletta [place] aware of IV abx need and will follow for drug and dose orders- Sticky note left for MD in chart regarding need to add HH-RN for IV abx. NCM to continue to follow for d/c planning  1500- Update- spoke with pt again at bedside regarding IV abx- [facility] requested to see if pt would possibly be able to get 2nd dose before discharge home and then start of service would be Saturday 09/27/13-  pt agreeable to having second dose given tomorrow prior to discharge and understands this will make going home later in day around 8 pm. She understands [facility] will then come out to home AM of 09/27/13-  She reports that she actually already has a RW at home and other DME and has no DME needs-

## 2013-09-25 NOTE — Progress Notes (Signed)
I discussed with  Dr Alease Frame.  I agree with their plans documented in their progress note.

## 2013-09-25 NOTE — Progress Notes (Signed)
Physical Therapy Treatment Patient Details Name: Autumn Duran MRN: [id number] DOB: April 26, 1950 Today's Date: 09/25/2013    History of Present Illness 63 yo F with increasing R leg pain and lower back pain for past 2 days. History of multiple myeloma recently diagnosed and being treated via radiation. Also history of recent (May 2015) kyphoplasty and biopsy after pathologic L3 fracture. Biopsy showed plasmacytoma. Found on MRI of back to have discitis with overlying epidural abscess. Pt s/p Left L5-S1 hemilaminectomy with resection of epidural abscess on 7/27.    PT Comments    Patient with increased ambulation today. Tolerated well. Spoke with patient at length regarding mobility, pain management strategies and positional changes. Patient appreciative. Will continue with current POC.  Follow Up Recommendations  Home health PT;Supervision/Assistance - 24 hour     Equipment Recommendations  Rolling walker with 5" wheels    Recommendations for Other Services       Precautions / Restrictions Precautions Precautions: Fall;Back Precaution Booklet Issued: No Precaution Comments: pt re-educated on back precautions for comfort Restrictions Weight Bearing Restrictions: No    Mobility  Bed Mobility Overal bed mobility: Needs Assistance Bed Mobility: Rolling;Sidelying to Sit Rolling: Supervision Sidelying to sit: Supervision          Transfers Overall transfer level: Needs assistance Equipment used: Rolling walker (2 wheeled) Transfers: Sit to/from Stand Sit to Stand: Min guard         General transfer comment: v/c's for safe hand placement, increased time  Ambulation/Gait Ambulation/Gait assistance: Supervision Ambulation Distance (Feet): 210 Feet Assistive device: Rolling walker (2 wheeled) Gait Pattern/deviations: Step-through pattern;Decreased stride length;Narrow base of support Gait velocity: decreased due to pain/fatigue  Gait velocity interpretation: Below normal  speed for age/gender General Gait Details: improved stability today, VCs on increased cadence.    Stairs            Wheelchair Mobility    Modified Rankin (Stroke Patients Only)       Balance     Sitting balance-Leahy Scale: Good     Standing balance support: During functional activity Standing balance-Leahy Scale: Fair                      Cognition Arousal/Alertness: Awake/alert Behavior During Therapy: WFL for tasks assessed/performed Overall Cognitive Status: Within Functional Limits for tasks assessed                      Exercises      General Comments General comments (skin integrity, edema, etc.): spoke with patient at length regardinging mobility expectations as well as positional changes and pain management strategies.      Pertinent Vitals/Pain 5/10 pain (low back region)    Home Living Family/patient expects to be discharged to:: Private residence Living Arrangements: Alone Available Help at Discharge: Family;Available 24 hours/day (reports sister going home with her) Type of Home: House Home Access: Level entry   Home Layout: Able to live on main level with bedroom/bathroom;Two level Home Equipment: Walker - 2 wheels;Bedside commode      Prior Function Level of Independence: Independent with assistive device(s)      Comments: pt uses cane, sister does grocery shopping   PT Goals (current goals can now be found in the care plan section) Acute Rehab PT Goals Patient Stated Goal: to get better and go home PT Goal Formulation: With patient Time For Goal Achievement: 10/01/13 Potential to Achieve Goals: Good    Frequency  Min 5X/week  PT Plan      Co-evaluation             End of Session Equipment Utilized During Treatment: Gait belt Activity Tolerance: Patient limited by fatigue;Patient limited by pain Patient left: in bed;with call bell/phone within reach;with bed alarm set     Time: 8916-9450 PT Time  Calculation (min): 24 min  Charges:  $Gait Training: 8-22 mins $Self Care/Home Management: 8-22                    G CodesDuncan Dull 10/09/13, 9:37 AM Alben Deeds, PT DPT  [phone]

## 2013-09-26 DIAGNOSIS — C9000 Multiple myeloma not having achieved remission: Secondary | ICD-10-CM | POA: Diagnosis not present

## 2013-09-26 DIAGNOSIS — A419 Sepsis, unspecified organism: Secondary | ICD-10-CM | POA: Diagnosis not present

## 2013-09-26 DIAGNOSIS — IMO0002 Reserved for concepts with insufficient information to code with codable children: Secondary | ICD-10-CM

## 2013-09-26 DIAGNOSIS — M519 Unspecified thoracic, thoracolumbar and lumbosacral intervertebral disc disorder: Secondary | ICD-10-CM | POA: Diagnosis not present

## 2013-09-26 LAB — BASIC METABOLIC PANEL
Anion gap: 7 (ref 5–15)
BUN: 11 mg/dL (ref 6–23)
CO2: 28 mEq/L (ref 19–32)
Calcium: 9.7 mg/dL (ref 8.4–10.5)
Chloride: 102 mEq/L (ref 96–112)
Creatinine, Ser: 0.53 mg/dL (ref 0.50–1.10)
GFR calc Af Amer: >90 mL/min (ref >90)
GFR calc non Af Amer: >90 mL/min (ref >90)
Glucose, Bld: 76 mg/dL (ref 70–99)
Potassium: 3.7 mEq/L (ref 3.7–5.3)
Sodium: 137 mEq/L (ref 137–147)

## 2013-09-26 LAB — CBC
HCT: 29.2 % — ABNORMAL LOW (ref 36.0–46.0)
Hemoglobin: 9.2 g/dL — ABNORMAL LOW (ref 12.0–15.0)
MCH: 25.6 pg — ABNORMAL LOW (ref 26.0–34.0)
MCHC: 31.5 g/dL (ref 30.0–36.0)
MCV: 81.1 fL (ref 78.0–100.0)
Platelets: 280 10*3/uL (ref 150–400)
RBC: 3.6 MIL/uL — ABNORMAL LOW (ref 3.87–5.11)
RDW: 15.5 % (ref 11.5–15.5)
WBC: 8.1 10*3/uL (ref 4.0–10.5)

## 2013-09-26 MED ORDER — VANCOMYCIN HCL IN DEXTROSE 750-5 MG/150ML-% IV SOLN: 750.0000 mg | Freq: Two times a day (BID) | INTRAVENOUS | Status: DC

## 2013-09-26 MED ORDER — OXYCODONE HCL 5 MG PO TABS: 5.0000 mg | ORAL_TABLET | Freq: Four times a day (QID) | ORAL | Status: DC | PRN

## 2013-09-26 MED ORDER — SENNOSIDES-DOCUSATE SODIUM 8.6-50 MG PO TABS: 2.0000 | ORAL_TABLET | Freq: Every day | ORAL | Status: DC

## 2013-09-26 NOTE — Discharge Summary (Signed)
I discussed with  Dr Alease Frame.  I agree with their plans documented in their progress note.

## 2013-09-26 NOTE — Progress Notes (Signed)
Nursing Discharge instructions given to pt.  Pt verbalized understanding.  DC instructions signed and copy along with prescription given to pt.  Pt wheeled to private vehicle.  No complaints or questions verbalized.

## 2013-09-27 LAB — ANAEROBIC CULTURE

## 2013-09-27 LAB — CULTURE, BLOOD (ROUTINE X 2)
Culture: NO GROWTH
Culture: NO GROWTH

## 2013-10-07 ENCOUNTER — Encounter (HOSPITAL_COMMUNITY): Payer: Self-pay

## 2013-10-07 ENCOUNTER — Encounter (HOSPITAL_COMMUNITY): Payer: BC Managed Care – PPO

## 2013-10-07 ENCOUNTER — Encounter (HOSPITAL_COMMUNITY): Payer: BC Managed Care – PPO | Attending: Hematology and Oncology

## 2013-10-07 VITALS — BP 129/62 | HR 70 | Temp 98.6°F | Resp 18 | Wt 166.2 lb

## 2013-10-07 DIAGNOSIS — D72822 Plasmacytosis: Secondary | ICD-10-CM

## 2013-10-07 DIAGNOSIS — A4902 Methicillin resistant Staphylococcus aureus infection, unspecified site: Secondary | ICD-10-CM

## 2013-10-07 DIAGNOSIS — F411 Generalized anxiety disorder: Secondary | ICD-10-CM

## 2013-10-07 LAB — CBC WITH DIFFERENTIAL/PLATELET
Basophils Absolute: 0 10*3/uL (ref 0.0–0.1)
Basophils Relative: 0 % (ref 0–1)
Eosinophils Absolute: 0 10*3/uL (ref 0.0–0.7)
Eosinophils Relative: 0 % (ref 0–5)
HCT: 34.5 % — ABNORMAL LOW (ref 36.0–46.0)
Hemoglobin: 11 g/dL — ABNORMAL LOW (ref 12.0–15.0)
Lymphocytes Relative: 5 % — ABNORMAL LOW (ref 12–46)
Lymphs Abs: 0.4 10*3/uL — ABNORMAL LOW (ref 0.7–4.0)
MCH: 26.2 pg (ref 26.0–34.0)
MCHC: 31.9 g/dL (ref 30.0–36.0)
MCV: 82.1 fL (ref 78.0–100.0)
Monocytes Absolute: 0.2 10*3/uL (ref 0.1–1.0)
Monocytes Relative: 2 % — ABNORMAL LOW (ref 3–12)
Neutro Abs: 7.6 10*3/uL (ref 1.7–7.7)
Neutrophils Relative %: 93 % — ABNORMAL HIGH (ref 43–77)
Platelets: 330 10*3/uL (ref 150–400)
RBC: 4.2 MIL/uL (ref 3.87–5.11)
RDW: 17 % — ABNORMAL HIGH (ref 11.5–15.5)
WBC: 8.2 10*3/uL (ref 4.0–10.5)

## 2013-10-07 LAB — COMPREHENSIVE METABOLIC PANEL
ALT: 11 U/L (ref 0–35)
AST: 12 U/L (ref 0–37)
Albumin: 3.4 g/dL — ABNORMAL LOW (ref 3.5–5.2)
Alkaline Phosphatase: 54 U/L (ref 39–117)
Anion gap: 11 (ref 5–15)
BUN: 15 mg/dL (ref 6–23)
CO2: 25 mEq/L (ref 19–32)
Calcium: 10.6 mg/dL — ABNORMAL HIGH (ref 8.4–10.5)
Chloride: 101 mEq/L (ref 96–112)
Creatinine, Ser: 0.71 mg/dL (ref 0.50–1.10)
GFR calc Af Amer: >90 mL/min (ref >90)
GFR calc non Af Amer: >90 mL/min (ref >90)
Glucose, Bld: 110 mg/dL — ABNORMAL HIGH (ref 70–99)
Potassium: 4.4 mEq/L (ref 3.7–5.3)
Sodium: 137 mEq/L (ref 137–147)
Total Bilirubin: 0.4 mg/dL (ref 0.3–1.2)
Total Protein: 6.5 g/dL (ref 6.0–8.3)

## 2013-10-07 LAB — LACTATE DEHYDROGENASE: LDH: 178 U/L (ref 94–250)

## 2013-10-07 MED ORDER — DIAZEPAM 5 MG PO TABS: 2.5000 mg | ORAL_TABLET | Freq: Three times a day (TID) | ORAL | Status: DC | PRN

## 2013-10-07 MED ORDER — DEXAMETHASONE 4 MG PO TABS: 4.0000 mg | ORAL_TABLET | Freq: Every day | ORAL | Status: DC

## 2013-10-07 MED ORDER — OXYCODONE HCL ER 20 MG PO T12A: 20.0000 mg | EXTENDED_RELEASE_TABLET | Freq: Two times a day (BID) | ORAL | Status: DC

## 2013-10-07 MED ORDER — HEPARIN SOD (PORK) LOCK FLUSH 100 UNIT/ML IV SOLN
500.0000 [IU] | Freq: Once | INTRAVENOUS | Status: AC
  Administered 2013-10-07: 300 [IU] via INTRAVENOUS
  Filled 2013-10-07: qty 5

## 2013-10-07 MED ORDER — POTASSIUM CHLORIDE CRYS ER 20 MEQ PO TBCR: 20.0000 meq | EXTENDED_RELEASE_TABLET | Freq: Two times a day (BID) | ORAL | Status: DC

## 2013-10-07 MED ORDER — OXYCODONE HCL 5 MG PO TABS: ORAL_TABLET | ORAL | Status: DC

## 2013-10-07 MED ORDER — SODIUM CHLORIDE 0.9 % IJ SOLN
10.0000 mL | INTRAMUSCULAR | Status: DC | PRN
  Administered 2013-10-07: 10 mL via INTRAVENOUS

## 2013-10-07 NOTE — Progress Notes (Signed)
[facility]  OFFICE PROGRESS NOTE  [name],[name], MD [place] [address]  DIAGNOSIS: Plasmacytosis - Plan: CBC with Differential, Comprehensive metabolic panel, Lactate dehydrogenase, Immunofixation electrophoresis, Multiple myeloma panel, serum, Kappa/lambda light chains, heparin lock flush 100 unit/mL, sodium chloride 0.9 % injection 10 mL  Chief Complaint  Patient presents with   L3 plasmacytoma   Smoldering myeloma   follow-up    CURRENT THERAPY: Hydrocodone for l pain, and milk of magnesia. External beam radiotherapy to the lumbar spine completing 10 courses of treatment ending in July 2015.  INTERVAL HISTORY: Autumn Duran 63 y.o. female returns for followup a plasmacytoma involving L3 found at the time of vertebroplasty with final workup consistent with smoldering myeloma with kappa/lambda ratio 0.84 last measured on 08/19/2013 with negative bone survey and no evidence of renal impairment or hypercalcemia. She was admitted to hospital on 09/22/2013 with back pain and fever with hypotension and was found to have an epidural abscess. He underwent decompression laminectomy on 09/22/2013 with MRSA infection, currently on vancomycin every 12 hours at home. She has a PICC line in the right upper extremity. She still has lower extremity unsteadiness but is able to ambulate. She's had some numbness involving the right great toe. She denies any recurrent fever, cough, wheezing, sore throat, vaginal discharge or itching, lower extremity swelling or redness, PND, orthopnea, palpitations, skin rash, headache, or seizures.  MEDICAL HISTORY: Past Medical History  Diagnosis Date   Diverticulosis    Hemorrhoids    Hx of adenomatous colonic polyps    Hiatal hernia    Hypertension    Hyperlipidemia    GERD (gastroesophageal reflux disease)    Chronic kidney disease     kidney stone s/p stent placement ( removed)   Complication of  anesthesia    PONV (postoperative nausea and vomiting)    Bronchitis, chronic    Chronic back pain    Lumbar radicular pain    Compression fracture of L3 lumbar vertebra    T7 vertebral fracture    Smoldering myeloma     INTERIM HISTORY: has Benign neoplasm of colon; Esophageal reflux; Other dysphagia; Lumbar compression fracture; Compression fracture; Hypercalcemia; Hypokalemia; Sepsis; and Smoldering myeloma on her problem list.    ALLERGIES:  is allergic to morphine and related and tetracyclines & related.  MEDICATIONS: has a current medication list which includes the following prescription(s): cyclobenzaprine, dexamethasone, diazepam, feeding supplement, hydrocodone-acetaminophen, omeprazole, oxycodone, potassium chloride sa, senna-docusate, vancomycin, and oxycodone, and the following Facility-Administered Medications: sodium chloride.  SURGICAL HISTORY:  Past Surgical History  Procedure Laterality Date   Cholecystectomy     Esophagogastroduodenoscopy  12/18/2011    Procedure: ESOPHAGOGASTRODUODENOSCOPY (EGD);  Surgeon: Lafayette Dragon, MD;  Location: [facility];  Service: Endoscopy;  Laterality: N/A;   Colonoscopy  12/18/2011    Procedure: COLONOSCOPY;  Surgeon: Lafayette Dragon, MD;  Location: [facility];  Service: Endoscopy;  Laterality: N/A;   Ureteral stent placement     Vertebroplasty N/A 07/14/2013    Procedure: VERTEBROPLASTY WITH LUMBAR THREE BIOPSY;  Surgeon: Charlie Pitter, MD;  Location: [facility];  Service: Neurosurgery;  Laterality: N/A;  VERTEBROPLASTY WITH LUMBAR THREE BIOPSY   Back surgery     Lumbar laminectomy for epidural abscess Left 09/22/2013    Procedure: LUMBAR LAMINECTOMY FOR EPIDURAL ABSCESS LEFT LUMBAR FIVE-SACRAL ONE;  Surgeon: Charlie Pitter, MD;  Location: [facility];  Service: Neurosurgery;  Laterality: Left;  FAMILY HISTORY: family history includes Breast cancer (age of onset: 7) in her sister; Breast cancer (age of onset: 31) in  her other.  SOCIAL HISTORY:  reports that she has never smoked. She has never used smokeless tobacco. She reports that she drinks alcohol. She reports that she does not use illicit drugs.  REVIEW OF SYSTEMS:  Other than that discussed above is noncontributory.  PHYSICAL EXAMINATION: ECOG PERFORMANCE STATUS: 2 - Symptomatic, <50% confined to bed  Blood pressure 129/62, pulse 70, temperature 98.6 F (37 C), temperature source Oral, resp. rate 18, weight 166 lb 3.2 oz (75.388 kg).  GENERAL:alert, no distress and comfortable SKIN: skin color, texture, turgor are normal, no rashes or significant lesions EYES: PERLA; Conjunctiva are pink and non-injected, sclera clear SINUSES: No redness or tenderness over maxillary or ethmoid sinuses OROPHARYNX:no exudate, no erythema on lips, buccal mucosa, or tongue. NECK: supple, thyroid normal size, non-tender, without nodularity. No masses CHEST: Increased AP diameter with no breast masses. LYMPH:  no palpable lymphadenopathy in the cervical, axillary or inguinal LUNGS: clear to auscultation and percussion with normal breathing effort HEART: regular rate & rhythm and no murmurs. ABDOMEN:abdomen soft, non-tender and normal bowel sounds MUSCULOSKELETAL:no cyanosis of digits and no clubbing. Range of motion normal. . Right upper extremity PICC line in place. Lumbar surgical bandages in place and was not disturbed.  NEURO: alert & oriented x 3 with fluent speech, bilateral lower 70 hyperreflexia.    LABORATORY DATA: Appointment on 10/07/2013  Component Date Value Ref Range Status   WBC 10/07/2013 8.2  4.0 - 10.5 K/uL Final   RBC 10/07/2013 4.20  3.87 - 5.11 MIL/uL Final   Hemoglobin 10/07/2013 11.0* 12.0 - 15.0 g/dL Final   HCT 10/07/2013 34.5* 36.0 - 46.0 % Final   MCV 10/07/2013 82.1  78.0 - 100.0 fL Final   MCH 10/07/2013 26.2  26.0 - 34.0 pg Final   MCHC 10/07/2013 31.9  30.0 - 36.0 g/dL Final   RDW 10/07/2013 17.0* 11.5 - 15.5 % Final     Platelets 10/07/2013 330  150 - 400 K/uL Final   Neutrophils Relative % 10/07/2013 93* 43 - 77 % Final   Neutro Abs 10/07/2013 7.6  1.7 - 7.7 K/uL Final   Lymphocytes Relative 10/07/2013 5* 12 - 46 % Final   Lymphs Abs 10/07/2013 0.4* 0.7 - 4.0 K/uL Final   Monocytes Relative 10/07/2013 2* 3 - 12 % Final   Monocytes Absolute 10/07/2013 0.2  0.1 - 1.0 K/uL Final   Eosinophils Relative 10/07/2013 0  0 - 5 % Final   Eosinophils Absolute 10/07/2013 0.0  0.0 - 0.7 K/uL Final   Basophils Relative 10/07/2013 0  0 - 1 % Final   Basophils Absolute 10/07/2013 0.0  0.0 - 0.1 K/uL Final   Sodium 10/07/2013 137  137 - 147 mEq/L Final   Potassium 10/07/2013 4.4  3.7 - 5.3 mEq/L Final   Chloride 10/07/2013 101  96 - 112 mEq/L Final   CO2 10/07/2013 25  19 - 32 mEq/L Final   Glucose, Bld 10/07/2013 110* 70 - 99 mg/dL Final   BUN 10/07/2013 15  6 - 23 mg/dL Final   Creatinine, Ser 10/07/2013 0.71  0.50 - 1.10 mg/dL Final   Calcium 10/07/2013 10.6* 8.4 - 10.5 mg/dL Final   Total Protein 10/07/2013 6.5  6.0 - 8.3 g/dL Final   Albumin 10/07/2013 3.4* 3.5 - 5.2 g/dL Final   AST 10/07/2013 12  0 - 37 U/L Final  ALT 10/07/2013 11  0 - 35 U/L Final   Alkaline Phosphatase 10/07/2013 54  39 - 117 U/L Final   Total Bilirubin 10/07/2013 0.4  0.3 - 1.2 mg/dL Final   GFR calc non Af Amer 10/07/2013 >90  >90 mL/min Final   GFR calc Af Amer 10/07/2013 >90  >90 mL/min Final   Comment: (NOTE)                          The eGFR has been calculated using the CKD EPI equation.                          This calculation has not been validated in all clinical situations.                          eGFR's persistently <90 mL/min signify possible Chronic Kidney                          Disease.   Anion gap 10/07/2013 11  5 - 15 Final   LDH 10/07/2013 178  94 - 250 U/L Final   Total Protein 10/07/2013 PENDING  6.0 - 8.3 g/dL Incomplete   Albumin ELP 10/07/2013 PENDING  55.8 - 66.1 %  Incomplete   Alpha-1-Globulin 10/07/2013 PENDING  2.9 - 4.9 % Incomplete   Alpha-2-Globulin 10/07/2013 PENDING  7.1 - 11.8 % Incomplete   Beta Globulin 10/07/2013 PENDING  4.7 - 7.2 % Incomplete   Beta 2 10/07/2013 PENDING  3.2 - 6.5 % Incomplete   Gamma Globulin 10/07/2013 PENDING  11.1 - 18.8 % Incomplete   M-Spike, % 10/07/2013 PENDING   Incomplete   SPE Interp. 10/07/2013 PENDING   Incomplete   Comment 10/07/2013 PENDING   Incomplete   IgG (Immunoglobin G), Serum 10/07/2013 PENDING  694 - 1618 mg/dL Incomplete   IgA 10/07/2013 PENDING  68 - 378 mg/dL Incomplete   IgM, Serum 10/07/2013 PENDING  60 - 263 mg/dL Incomplete   Immunofix Electr Int 10/07/2013 PENDING   Incomplete  Admission on 09/20/2013, Discharged on 09/26/2013  No results displayed because visit has over 200 results.      PATHOLOGY:  for LAKENDRA, HELLING ([id number]) Patient: GARRETT, BOWRING Collected: 07/14/2013 Client: [facility] Accession: [id number] Received: 07/15/2013 Earnie Larsson DOB: 1950-06-04 Age: 63 Gender: F Reported: 07/18/2013 [address] Patient Ph: [phone] MRN #: [id number] [place], [place] [address] Visit #: [id number] Chart #: Phone:  Fax: CC: Donato Heinz, MD REPORT OF SURGICAL PATHOLOGY FINAL DIAGNOSIS Diagnosis Bone, biopsy, lumbar three - LAMELLAR BONE WITH ASSOCIATED FIBROUS TISSUE AND INCREASED PLASMA CELLS. - THERE IS NO EVIDENCE OF MALIGNANCY. - SEE COMMENT. Microscopic Comment Pending immunohistochemical stains. Diagnosis Note There are scattered plasma cells (approximately 5-10%), however, no aggregates are seen. Light chain immunohistochemistry is technically unsatisfactory. Pancytokeratin is negative. Correlation with clinical, radiographic, and laboratory data (SPEP, etc.) is recommended. Vicente Males MD Pathologist, Electronic Signature (Case signed 07/18/2013) Specimen Gross and Clinical Information Specimen(s) Obtained: Bone, biopsy, lumbar  three Specimen Clinical Information Lumbar three fracture (tl) Gross Received fresh is a 1 cm in length and 0.3 cm in diameter core of tan-yellow to hyperemic firm bone, submitted in one block following decalcification. (SSW:ecj 07/15/2013) Stain(s) used in Diagnosis: The following stain(s) were used in diagnosing the case: LAMBDA, KAPPA, CD 138, CK AE1AE3. The control(s) stained appropriately. 1 of 2  FINAL for MIRIELLE, BYRUM ([id number]) Disclaimer Some of these immunohistochemical stains may have been developed and the performance characteristics determined by [facility]. Some may not have been cleared or approved by the U.S. Food and Drug Administration. The FDA has determined that such clearance or approval is not necessary. This test is used for clinical purposes. It should not be regarded as investigational or for research. This laboratory is certified under the [place] (CLIA-88) as qualified to perform high complexity clinical laboratory testing. Report signed out from the following location(s) Technical Component performed at [facility] [place], [place], [place] [address]. CLIA #: [id number], Technical Component performed at [place], [place], [place] [address]. CLIA #: [id number], Interpretation performed at [place], [place], [place] [address]. CLIA #: [id number],    Urinalysis    Component Value Date/Time   COLORURINE YELLOW 09/20/2013 2334   APPEARANCEUR CLOUDY* 09/20/2013 2334   LABSPEC 1.017 09/20/2013 2334   PHURINE 6.0 09/20/2013 2334   GLUCOSEU NEGATIVE 09/20/2013 2334   HGBUR NEGATIVE 09/20/2013 2334   BILIRUBINUR NEGATIVE 09/20/2013 2334   KETONESUR NEGATIVE 09/20/2013 2334   PROTEINUR NEGATIVE 09/20/2013 2334   UROBILINOGEN 0.2 09/20/2013 2334   NITRITE NEGATIVE 09/20/2013 2334   LEUKOCYTESUR NEGATIVE 09/20/2013 2334     RADIOGRAPHIC STUDIES: Dg Lumbar Spine Complete  09/19/2013   CLINICAL DATA:  Back pain.  EXAM: LUMBAR SPINE - COMPLETE 4+ VIEW  COMPARISON:  CT 08/26/2013.  FINDINGS: Methylmethacrylate in the L3 vertebral body. Lumbar vertebrae are numbered with the lowest segmented appearing vertebrae on lateral view as L5. No acute abnormality. Diffuse osteopenia degenerative change. Surgical clips right upper quadrant.  IMPRESSION: 1. No acute abnormality. 2. Prior vertebroplasty L3.   Electronically Signed   By: Marcello Moores  Register   On: 09/19/2013 15:25   Mr Lumbar Spine W Wo Contrast  09/23/2013   ADDENDUM REPORT: 09/23/2013 13:06  ADDENDUM: There is an error in the initially dictated body and impression portion of this report. The described inflammatory changes concerning for septic arthritis are about the left L4-5 facet joint, not the L5-S1 facet joint.   Electronically Signed   By: Jeannine Boga M.D.   On: 09/23/2013 13:06   09/23/2013   CLINICAL DATA:  back pain, fever  EXAM: MRI LUMBAR SPINE WITHOUT AND WITH CONTRAST  TECHNIQUE: Multiplanar and multiecho pulse sequences of the lumbar spine were obtained without and with intravenous contrast.  CONTRAST:  55m MULTIHANCE GADOBENATE DIMEGLUMINE 529 MG/ML IV SOLN  COMPARISON:  Prior radiograph from earlier the same day as well as previous MRI from 07/04/2013.  FINDINGS: For the purposes of this dictation, the lowest well-formed intervertebral disc spaces presumed to be the L5-S1 level, and there presumed to be 5 lumbar type vertebral bodies.  5 mm of anterolisthesis of L5 on S1 is stable from prior. Vertebral bodies are otherwise normally aligned with preservation of the normal lumbar lordosis.  Signal intensity within the visualized spinal cord is normal. Conus medullaris terminates at the L1 level.  Sequelae of prior kyphoplasty/vertebroplasty for pathologic fracture at L3 is seen. Vertebral body height at the L3 level is stable from prior study without  progressive height loss.  There is abnormal T1 hypointense, hyperintense STIR signal intensity, with avid post-contrast enhancement about the L2-3 and L3-4 intervertebral disc spaces. There is heterogeneously increased T2/STIR signal intensity within the L2-3 and L3-4 disc spaces themselves. Mild endplate irregularity seen  at the inferior endplate of L2, new from prior. Additionally, there is increased endplate irregularity with progressive intervertebral disc space height loss at the L3-4 level. Findings are concerning for osteomyelitis discitis. Enhancement and phlegmonous changes seen within the right paravertebral musculature at the level of L3-4 (series 1300, image 18). No loculated paraspinous abscess.  Distally, there is increased T2/STIR signal intensity within the left pedicle of L5 and about the left L5-S1 facet joint with avid post-contrast enhancement (series 1100, image 9). Edema and enhancement is seen within the adjacent paraspinous soft tissues. No definite paraspinous abscess at this level.  There is an apparent loculated collection within the left ventral epidural space posterior to the L5 vertebral body that measures approximately 1.1 x 1.1 x 1.5 cm (series 1100, image 7 on sagittal sequence; series 800, image 32 on axial sequence). Finding is concerning for a small epidural abscess. There is associated mass effect on the adjacent thecal sac with moderate canal stenosis. Mild right with moderate left foraminal narrowing at this level not significantly changed.  Additional multilevel degenerative changes within the lumbar spine are grossly stable from prior study.  IMPRESSION: 1. Findings concerning for osteomyelitis discitis at L2-3 and L3-4. 2. Abnormal edema and enhancement within the left pedicle of L5 and about the left L5-S1 joint, also concerning for infection/septic arthritis. 3. 1.1 x 1.1 x 1.5 cm loculated collection within the left ventral epidural space posterior to L5, concerning for  possible epidural abscess. There is moderate central canal narrowing at this level.  Electronically Signed: By: Jeannine Boga M.D. On: 09/21/2013 04:36   Dg Lumbar Spine 1 View  09/22/2013   CLINICAL DATA:  Lumbar spine surgery.  EXAM: LUMBAR SPINE - 1 VIEW  COMPARISON:  Lumbar spine MRI 09/21/2013.  FINDINGS: Metallic marker noted along the posterior aspect of the lower lumbar spine at the L5-S1 disc space. Lumbar vertebra are numbered with the lowest segmented vertebrae as L5. Methylmethacrylate noted in the L3 vertebral body.  IMPRESSION: 1. Metallic marker noted at the L5-S1 disc space. 2. Methylmethacrylate in the L3 vertebral body.   Electronically Signed   By: Marcello Moores  Register   On: 09/22/2013 16:24   Dg Chest Port 1 View  (if Code Sepsis Called)  09/20/2013   CLINICAL DATA:  Fever and leg pain.  Shortness of breath.  EXAM: PORTABLE CHEST - 1 VIEW  COMPARISON:  Chest radiograph performed 12/27/2012, and CT of the chest performed 07/12/2013  FINDINGS: The lungs are mildly hypoexpanded. Mild bibasilar opacities may reflect atelectasis or possibly mild pneumonia, given the patient's symptoms. There is no evidence of pleural effusion or pneumothorax.  The cardiomediastinal silhouette is borderline normal in size. No acute osseous abnormalities are seen. A large hiatal hernia is again seen.  IMPRESSION: 1. Lungs mildly hypoexpanded. Mild bibasilar airspace opacities may reflect atelectasis or possibly mild pneumonia, given the patient's symptoms. 2. Large hiatal hernia again noted.   Electronically Signed   By: Garald Balding M.D.   On: 09/20/2013 22:54    ASSESSMENT:  #1. Smoldering myeloma, awaiting today's additional lab tests. #2. Status post lumbar laminectomy for MRSA epidural abscess, currently on intravenous vancomycin. Previous L3 vertebroplasty and radiotherapy ending about 3 weeks ago. #3. Hypertension, controlled.    PLAN:  #1. Complete vancomycin antibiotic therapy for 6-8  weeks. #2. OxyContin 20 mg every 12 hours. #3. Oxycodone 5-10 mg every 4 hours as needed for pain. #4. Milk of magnesia to maintain bowel movements. #5. Valium 2.5 mg  twice a day to control anxiety. #6. Continue physical therapy 3 times a week. #7. Followup in one month with CBC, chem profile, myeloma panel, light chains. #8. Dexamethasone 4 mg daily.    All [facility] questions were answered. The patient knows to call the clinic with any problems, questions or concerns. We can certainly see the patient much sooner if necessary.   I spent 30 minutes counseling the patient face to face. The total time spent in the appointment was 40 minutes.    Doroteo Bradford, MD 10/07/2013 3:30 PM  DISCLAIMER:  This note was dictated with voice recognition software.  Similar sounding words can inadvertently be transcribed inaccurately and may not be corrected upon review.

## 2013-10-07 NOTE — Patient Instructions (Signed)
[facility] Discharge Instructions  RECOMMENDATIONS MADE BY THE CONSULTANT AND ANY TEST RESULTS WILL BE SENT TO YOUR REFERRING PHYSICIAN.  EXAM FINDINGS BY THE PHYSICIAN TODAY AND SIGNS OR SYMPTOMS TO REPORT TO CLINIC OR PRIMARY PHYSICIAN: Exam and findings as discussed by Dr. Barnet Glasgow. Report uncontrolled pain, or other issues.  MEDICATIONS PRESCRIBED:  Refills for: Potassium Oxycodone Oxycontin  INSTRUCTIONS/FOLLOW-UP: Follow-up in 1 month with labs and office visit.  Thank you for choosing [facility] to provide your oncology and hematology care.  To afford each patient quality time with our providers, please arrive at least 15 minutes before your scheduled appointment time.  With your help, our goal is to use those 15 minutes to complete the necessary work-up to ensure our physicians have the information they need to help with your evaluation and healthcare recommendations.    Effective January 1st, 2014, we ask that you re-schedule your appointment with our physicians should you arrive 10 or more minutes late for your appointment.  We strive to give you quality time with our providers, and arriving late affects you and other patients whose appointments are after yours.    Again, thank you for choosing [facility].  Our hope is that these requests will decrease the amount of time that you wait before being seen by our physicians.       _____________________________________________________________  Should you have questions after your visit to [facility], please contact our office at [phone] between the hours of 8:30 a.m. and 4:30 p.m.  Voicemails left after 4:30 p.m. will not be returned until the following business day.  For prescription refill requests, have your pharmacy contact our office with your prescription refill request.    _______________________________________________________________  We hope that we  have given you very good care.  You may receive a patient satisfaction survey in the mail, please complete it and return it as soon as possible.  We value your feedback!  _______________________________________________________________  Have you asked about our STAR program?  STAR stands for Survivorship Training and Rehabilitation, and this is a nationally recognized cancer care program that focuses on survivorship and rehabilitation.  Cancer and cancer treatments may cause problems, such as, pain, making you feel tired and keeping you from doing the things that you need or want to do. Cancer rehabilitation can help. Our goal is to reduce these troubling effects and help you have the best quality of life possible.  You may receive a survey from a nurse that asks questions about your current state of health.  Based on the survey results, all eligible patients will be referred to the [facility] program for an evaluation so we can better serve you!  A frequently asked questions sheet is available upon request.

## 2013-10-07 NOTE — Progress Notes (Signed)
Autumn Duran presented for labwork. Labs per MD order drawn via PICC line located in right upper arm. Good blood return present. Procedure without incident.  PICC line flushed with 20cc NS and 300U/79ml Heparin per protocol and remains intact. Patient tolerated procedure well.

## 2013-10-08 LAB — KAPPA/LAMBDA LIGHT CHAINS
Kappa free light chain: 1.32 mg/dL (ref 0.33–1.94)
Kappa, lambda light chain ratio: 0.91 (ref 0.26–1.65)
Lambda free light chains: 1.45 mg/dL (ref 0.57–2.63)

## 2013-10-10 LAB — MULTIPLE MYELOMA PANEL, SERUM
Albumin ELP: 55.8 % (ref 55.8–66.1)
Alpha-1-Globulin: 6 % — ABNORMAL HIGH (ref 2.9–4.9)
Alpha-2-Globulin: 11.9 % — ABNORMAL HIGH (ref 7.1–11.8)
Beta 2: 5.2 % (ref 3.2–6.5)
Beta Globulin: 7.9 % — ABNORMAL HIGH (ref 4.7–7.2)
Gamma Globulin: 13.2 % (ref 11.1–18.8)
IgA: 177 mg/dL (ref 69–380)
IgG (Immunoglobin G), Serum: 838 mg/dL (ref 690–1700)
IgM, Serum: 58 mg/dL — ABNORMAL LOW (ref 52–322)
M-Spike, %: 0.24 g/dL
Total Protein: 6.3 g/dL (ref 6.0–8.3)

## 2013-10-13 ENCOUNTER — Ambulatory Visit (INDEPENDENT_AMBULATORY_CARE_PROVIDER_SITE_OTHER): Payer: BC Managed Care – PPO | Admitting: Internal Medicine

## 2013-10-13 VITALS — Wt 167.0 lb

## 2013-10-13 DIAGNOSIS — G061 Intraspinal abscess and granuloma: Secondary | ICD-10-CM

## 2013-10-13 NOTE — Progress Notes (Signed)
Patient ID: Autumn Duran, female   DOB: 10-Feb-1951, 63 y.o.   MRN: [id number]         [facility]  Patient Active Problem List   Diagnosis Date Noted  . Smoldering myeloma 09/22/2013  . Sepsis 09/21/2013  . Hypercalcemia 08/20/2013  . Hypokalemia 08/20/2013  . Lumbar compression fracture 07/12/2013  . Compression fracture 07/12/2013  . Benign neoplasm of colon 12/18/2011  . Esophageal reflux 12/18/2011  . Other dysphagia 12/18/2011    Patient's Medications  New Prescriptions   No medications on file  Previous Medications   CYCLOBENZAPRINE (FLEXERIL) 5 MG TABLET    Take 1 tablet (5 mg total) by mouth 3 (three) times daily as needed for muscle spasms.   DEXAMETHASONE (DECADRON) 4 MG TABLET    Take 1 tablet (4 mg total) by mouth daily.   DIAZEPAM (VALIUM) 5 MG TABLET    Take 0.5 tablets (2.5 mg total) by mouth every 8 (eight) hours as needed for anxiety.   FEEDING SUPPLEMENT (BOOST HIGH PROTEIN) LIQD    Take 1 Container by mouth 3 (three) times daily between meals.   HYDROCODONE-ACETAMINOPHEN (NORCO) 10-325 MG PER TABLET    Take 1 tablet by mouth every 6 (six) hours as needed for moderate pain.   OMEPRAZOLE (PRILOSEC) 40 MG CAPSULE    Take 40 mg by mouth daily as needed (acid reflux).    OXYCODONE (OXY IR/ROXICODONE) 5 MG IMMEDIATE RELEASE TABLET    Date 100 tablets every 4 hours as needed for pain.   OXYCODONE (OXYCONTIN) 20 MG T12A 12 HR TABLET    Take 1 tablet (20 mg total) by mouth every 12 (twelve) hours.   POTASSIUM CHLORIDE SA (K-DUR,KLOR-CON) 20 MEQ TABLET    Take 1 tablet (20 mEq total) by mouth 2 (two) times daily.   SENNA-DOCUSATE (SENOKOT-S) 8.6-50 MG PER TABLET    Take 2 tablets by mouth daily.   VANCOMYCIN (VANCOCIN) 750 MG/150ML SOLN    Inject 150 mLs (750 mg total) into the vein every 12 (twelve) hours.  Modified Medications   No medications on file  Discontinued Medications   No medications on file    Subjective: Autumn Duran is in for  her hospital followup visit. She was diagnosed with an MRSA lumbar epidural abscess and underwent incision and drainage on July 27. She is now completed 24 days of IV vancomycin. She's had no problems tolerating her vancomycin or PICC. Her back pain is improving. She has been able to be much more active and is requiring much less narcotic pain medication. Review of Systems: Pertinent items are noted in HPI.  Past Medical History  Diagnosis Date  . Diverticulosis   . Hemorrhoids   . Hx of adenomatous colonic polyps   . Hiatal hernia   . Hypertension   . Hyperlipidemia   . GERD (gastroesophageal reflux disease)   . Chronic kidney disease     kidney stone s/p stent placement ( removed)  . Complication of anesthesia   . PONV (postoperative nausea and vomiting)   . Bronchitis, chronic   . Chronic back pain   . Lumbar radicular pain   . Compression fracture of L3 lumbar vertebra   . T7 vertebral fracture   . Smoldering myeloma     History  Substance Use Topics  . Smoking status: Never Smoker   . Smokeless tobacco: Never Used  . Alcohol Use: Yes     Comment: social occ    Family History  Problem  Relation Age of Onset  . Breast cancer Sister 70  . Breast cancer Other 74    aunt    Allergies  Allergen Reactions  . Morphine And Related Other (See Comments)    Headache  . Tetracyclines & Related Nausea And Vomiting    Objective:    General: She is in good spirits. She is in no distress and walks with the aid of a cane Skin: Right arm PICC site appears normal Lumbar incision: She still has her postoperative dressing in place. There is no obvious drainage or erythema    Sed Rate (mm/hr)  Date Value  09/21/2013 7      CRP (mg/dL)  Date Value  09/21/2013 20.9*   Assessment: She is improving on therapy for MRSA lumbar infection.  Plan: 1. Continue vancomycin a minimum of 6 weeks total through September 5 2. Followup here in early September   Michel Bickers,  [place] for Autumn Duran [phone] pager   [phone] cell 10/13/2013, 3:45 PM

## 2013-10-21 ENCOUNTER — Inpatient Hospital Stay: Payer: BC Managed Care – PPO | Admitting: Internal Medicine

## 2013-10-27 ENCOUNTER — Other Ambulatory Visit (HOSPITAL_COMMUNITY): Payer: Self-pay | Admitting: Family Medicine

## 2013-10-27 ENCOUNTER — Ambulatory Visit: Payer: BC Managed Care – PPO | Admitting: Internal Medicine

## 2013-11-04 ENCOUNTER — Ambulatory Visit (INDEPENDENT_AMBULATORY_CARE_PROVIDER_SITE_OTHER): Payer: BC Managed Care – PPO | Admitting: Internal Medicine

## 2013-11-04 ENCOUNTER — Encounter: Payer: Self-pay | Admitting: Internal Medicine

## 2013-11-04 VITALS — BP 147/74 | HR 89 | Temp 98.2°F | Wt 170.0 lb

## 2013-11-04 DIAGNOSIS — Z0000 Encounter for general adult medical examination without abnormal findings: Secondary | ICD-10-CM

## 2013-11-04 DIAGNOSIS — G061 Intraspinal abscess and granuloma: Secondary | ICD-10-CM | POA: Insufficient documentation

## 2013-11-04 MED ORDER — DOXYCYCLINE HYCLATE 100 MG PO TABS: 100.0000 mg | ORAL_TABLET | Freq: Two times a day (BID) | ORAL | Status: DC

## 2013-11-04 NOTE — Progress Notes (Signed)
Phone call to [facility], order from Dr. Megan Salon to draw CRP and Sed Rate then discontinue the PICC line.  Pharmacist verbalized back the order.

## 2013-11-04 NOTE — Progress Notes (Signed)
Patient ID: Autumn Duran, female   DOB: 1950-06-06, 63 y.o.   MRN: [id number]         [facility] for [facility]  Patient Active Problem List   Diagnosis Date Noted  . Epidural abscess, L2-L5 11/04/2013  . Smoldering myeloma 09/22/2013  . Sepsis 09/21/2013  . Hypercalcemia 08/20/2013  . Hypokalemia 08/20/2013  . Lumbar compression fracture 07/12/2013  . Compression fracture 07/12/2013  . Benign neoplasm of colon 12/18/2011  . Esophageal reflux 12/18/2011  . Other dysphagia 12/18/2011    Patient's Medications  New Prescriptions   DOXYCYCLINE (VIBRA-TABS) 100 MG TABLET    Take 1 tablet (100 mg total) by mouth 2 (two) times daily.  Previous Medications   CYCLOBENZAPRINE (FLEXERIL) 5 MG TABLET    Take 1 tablet (5 mg total) by mouth 3 (three) times daily as needed for muscle spasms.   DEXAMETHASONE (DECADRON) 4 MG TABLET    Take 1 tablet (4 mg total) by mouth daily.   DIAZEPAM (VALIUM) 5 MG TABLET    Take 0.5 tablets (2.5 mg total) by mouth every 8 (eight) hours as needed for anxiety.   FEEDING SUPPLEMENT (BOOST HIGH PROTEIN) LIQD    Take 1 Container by mouth 3 (three) times daily between meals.   HYDROCODONE-ACETAMINOPHEN (NORCO) 10-325 MG PER TABLET    Take 1 tablet by mouth every 6 (six) hours as needed for moderate pain.   OMEPRAZOLE (PRILOSEC) 40 MG CAPSULE    Take 40 mg by mouth daily as needed (acid reflux).    OXYCODONE (OXY IR/ROXICODONE) 5 MG IMMEDIATE RELEASE TABLET    Date 100 tablets every 4 hours as needed for pain.   OXYCODONE (OXYCONTIN) 20 MG T12A 12 HR TABLET    Take 1 tablet (20 mg total) by mouth every 12 (twelve) hours.   POTASSIUM CHLORIDE SA (K-DUR,KLOR-CON) 20 MEQ TABLET    Take 1 tablet (20 mEq total) by mouth 2 (two) times daily.   SENNA-DOCUSATE (SENOKOT-S) 8.6-50 MG PER TABLET    Take 2 tablets by mouth daily.  Modified Medications   No medications on file  Discontinued Medications   HYDROCODONE-ACETAMINOPHEN (NORCO/VICODIN) 5-325 MG PER TABLET     Take 1 tablet by mouth every 6 (six) hours as needed for moderate pain.   VANCOMYCIN (VANCOCIN) 750 MG/150ML SOLN    Inject 150 mLs (750 mg total) into the vein every 12 (twelve) hours.    Subjective: [name] is in for her routine visit. She is feeling better with less back pain. She completed 43 days of IV vancomycin 2 days ago for her MRSA lumbar epidural abscess. She is off of her oxycontin and oxycodone and needs only 2 hydrocodone daily to control her pain.  Review of Systems: Pertinent items are noted in HPI.  Past Medical History  Diagnosis Date  . Diverticulosis   . Hemorrhoids   . Hx of adenomatous colonic polyps   . Hiatal hernia   . Hypertension   . Hyperlipidemia   . GERD (gastroesophageal reflux disease)   . Chronic kidney disease     kidney stone s/p stent placement ( removed)  . Complication of anesthesia   . PONV (postoperative nausea and vomiting)   . Bronchitis, chronic   . Chronic back pain   . Lumbar radicular pain   . Compression fracture of L3 lumbar vertebra   . T7 vertebral fracture   . Smoldering myeloma     History  Substance Use Topics  . Smoking status: Never Smoker   .  Smokeless tobacco: Never Used  . Alcohol Use: Yes     Comment: social occ    Family History  Problem Relation Age of Onset  . Breast cancer Sister 77  . Breast cancer Other 47    aunt    Allergies  Allergen Reactions  . Morphine And Related Other (See Comments)    Headache  . Tetracyclines & Related Nausea And Vomiting    Objective: Temp: 98.2 F (36.8 C) (09/08 0845) BP: 147/74 mmHg (09/08 0845) Pulse Rate: 89 (09/08 0845)  General:  She is in good spirits Skin: Right arm PICC site looks good  Sed Rate (mm/hr)  Date Value  09/21/2013 7      CRP (mg/dL)  Date Value  09/21/2013 20.9*      Assessment: She is improving on therapy for MRSA lumbar epidural abscess. I will change her vancomycin to oral doxycycline and have her PICC  removed.  Plan: 1. Change vancomycin to doxycycline 100 mg twice daily 2. Repeat sedimentation rate and C-reactive protein 3. Have PICC removed 4. Followup in 4 weeks   Michel Bickers, MD [facility] for [place] [phone] pager   [phone] cell 11/04/2013, 9:06 AM

## 2013-11-07 ENCOUNTER — Encounter (HOSPITAL_COMMUNITY): Payer: Self-pay

## 2013-11-07 ENCOUNTER — Encounter: Payer: Self-pay | Admitting: Internal Medicine

## 2013-11-07 ENCOUNTER — Encounter (HOSPITAL_BASED_OUTPATIENT_CLINIC_OR_DEPARTMENT_OTHER): Payer: BC Managed Care – PPO

## 2013-11-07 ENCOUNTER — Encounter (HOSPITAL_COMMUNITY): Payer: BC Managed Care – PPO | Attending: Hematology and Oncology

## 2013-11-07 ENCOUNTER — Telehealth (HOSPITAL_COMMUNITY): Payer: Self-pay

## 2013-11-07 VITALS — BP 133/68 | HR 65 | Temp 97.9°F | Resp 16 | Wt 168.6 lb

## 2013-11-07 DIAGNOSIS — G061 Intraspinal abscess and granuloma: Secondary | ICD-10-CM

## 2013-11-07 DIAGNOSIS — I10 Essential (primary) hypertension: Secondary | ICD-10-CM

## 2013-11-07 DIAGNOSIS — C9000 Multiple myeloma not having achieved remission: Secondary | ICD-10-CM

## 2013-11-07 DIAGNOSIS — D72822 Plasmacytosis: Secondary | ICD-10-CM | POA: Diagnosis present

## 2013-11-07 LAB — COMPREHENSIVE METABOLIC PANEL
ALT: 14 U/L (ref 0–35)
AST: 16 U/L (ref 0–37)
Albumin: 3.4 g/dL — ABNORMAL LOW (ref 3.5–5.2)
Alkaline Phosphatase: 50 U/L (ref 39–117)
Anion gap: 11 (ref 5–15)
BUN: 20 mg/dL (ref 6–23)
CO2: 25 mEq/L (ref 19–32)
Calcium: 10.2 mg/dL (ref 8.4–10.5)
Chloride: 104 mEq/L (ref 96–112)
Creatinine, Ser: 0.7 mg/dL (ref 0.50–1.10)
GFR calc Af Amer: >90 mL/min (ref >90)
GFR calc non Af Amer: >90 mL/min (ref >90)
Glucose, Bld: 152 mg/dL — ABNORMAL HIGH (ref 70–99)
Potassium: 4.7 mEq/L (ref 3.7–5.3)
Sodium: 140 mEq/L (ref 137–147)
Total Bilirubin: 0.4 mg/dL (ref 0.3–1.2)
Total Protein: 6.7 g/dL (ref 6.0–8.3)

## 2013-11-07 LAB — CBC WITH DIFFERENTIAL/PLATELET
Basophils Absolute: 0 10*3/uL (ref 0.0–0.1)
Basophils Relative: 0 % (ref 0–1)
Eosinophils Absolute: 0 10*3/uL (ref 0.0–0.7)
Eosinophils Relative: 0 % (ref 0–5)
HCT: 37.6 % (ref 36.0–46.0)
Hemoglobin: 11.7 g/dL — ABNORMAL LOW (ref 12.0–15.0)
Lymphocytes Relative: 4 % — ABNORMAL LOW (ref 12–46)
Lymphs Abs: 0.4 10*3/uL — ABNORMAL LOW (ref 0.7–4.0)
MCH: 25.5 pg — ABNORMAL LOW (ref 26.0–34.0)
MCHC: 31.1 g/dL (ref 30.0–36.0)
MCV: 82.1 fL (ref 78.0–100.0)
Monocytes Absolute: 0.1 10*3/uL (ref 0.1–1.0)
Monocytes Relative: 1 % — ABNORMAL LOW (ref 3–12)
Neutro Abs: 9.5 10*3/uL — ABNORMAL HIGH (ref 1.7–7.7)
Neutrophils Relative %: 95 % — ABNORMAL HIGH (ref 43–77)
Platelets: 312 10*3/uL (ref 150–400)
RBC: 4.58 MIL/uL (ref 3.87–5.11)
RDW: 16.4 % — ABNORMAL HIGH (ref 11.5–15.5)
WBC: 10.1 10*3/uL (ref 4.0–10.5)

## 2013-11-07 MED ORDER — HYDROCODONE-ACETAMINOPHEN 10-325 MG PO TABS: ORAL_TABLET | ORAL | Status: DC

## 2013-11-07 NOTE — Patient Instructions (Signed)
[place] Discharge Instructions  RECOMMENDATIONS MADE BY THE CONSULTANT AND ANY TEST RESULTS WILL BE SENT TO YOUR REFERRING PHYSICIAN.  EXAM FINDINGS BY THE PHYSICIAN TODAY AND SIGNS OR SYMPTOMS TO REPORT TO CLINIC OR PRIMARY PHYSICIAN: Exam and findings as discussed by Dr. Barnet Glasgow.  You can stop the potassium for now.  If anything concerns with your blood work we will contact you.  MEDICATIONS PRESCRIBED:  Hydrocodone - take as directed.  INSTRUCTIONS/FOLLOW-UP: Labs and office visit in 1 month.  Thank you for choosing [facility] to provide your oncology and hematology care.  To afford each patient quality time with our providers, please arrive at least 15 minutes before your scheduled appointment time.  With your help, our goal is to use those 15 minutes to complete the necessary work-up to ensure our physicians have the information they need to help with your evaluation and healthcare recommendations.    Effective January 1st, 2014, we ask that you re-schedule your appointment with our physicians should you arrive 10 or more minutes late for your appointment.  We strive to give you quality time with our providers, and arriving late affects you and other patients whose appointments are after yours.    Again, thank you for choosing [facility].  Our hope is that these requests will decrease the amount of time that you wait before being seen by our physicians.       _____________________________________________________________  Should you have questions after your visit to [facility], please contact our office at [phone] between the hours of 8:30 a.m. and 4:30 p.m.  Voicemails left after 4:30 p.m. will not be returned until the following business day.  For prescription refill requests, have your pharmacy contact our office with your prescription refill request.     _______________________________________________________________  We hope that we have given you very good care.  You may receive a patient satisfaction survey in the mail, please complete it and return it as soon as possible.  We value your feedback!  _______________________________________________________________  Have you asked about our STAR program?  STAR stands for Survivorship Training and Rehabilitation, and this is a nationally recognized cancer care program that focuses on survivorship and rehabilitation.  Cancer and cancer treatments may cause problems, such as, pain, making you feel tired and keeping you from doing the things that you need or want to do. Cancer rehabilitation can help. Our goal is to reduce these troubling effects and help you have the best quality of life possible.  You may receive a survey from a nurse that asks questions about your current state of health.  Based on the survey results, all eligible patients will be referred to the [facility] program for an evaluation so we can better serve you!  A frequently asked questions sheet is available upon request.

## 2013-11-07 NOTE — Progress Notes (Signed)
LABS FOR CBCD,CMP,KLLC,MM

## 2013-11-07 NOTE — Progress Notes (Signed)
[facility]  OFFICE PROGRESS NOTE  [name],[name], MD [place] [address]  DIAGNOSIS: Plasmacytosis - Plan: Multiple myeloma panel, serum, Kappa/lambda light chains  Abscess in epidural space of L2-L5 lumbar spine  Hypercalcemia  Chief Complaint  Patient presents with  . Plasmacytosis  . epidural abscess    CURRENT THERAPY: Status post kyphoplasty at which time plasmacytosis was found in the lumbar spine, status post external beam radiotherapy to the lumbar spine treatment ending in July 2015, status post decompression laminectomy on 09/22/2013 with MRSA epidural abscess; vancomycin  every 12  hours through a right upper extremity PICC line  INTERVAL HISTORY: Autumn Duran 64 y.o. female returns for followup a plasmacytoma involving L3 found at the time of vertebroplasty with final workup consistent with smoldering myeloma with kappa/lambda ratio 0.84 last measured on 08/19/2013 with negative bone survey and no evidence of renal impairment or hypercalcemia. Following peeling of postoperative radiotherapy ending in July 2015, the patient developed worsening back pain and was found to have an epidural abscess and underwent decompression laminectomy on 09/22/2013 with MRSA, treated with vancomycin every 12 hours through a right upper extremity PICC line initially, now on oral antibiotics. She is currently taking doxycycline 100 mg twice a day and was seen by a neurosurgeon yesterday who has continued to order physical therapy and will see the patient again on 12/25/2013. She has minimal lower 70 numbness and continues on dexamethasone daily. She denies any fever, night sweats, nausea, vomiting, diarrhea, constipation, vaginal bleeding, sore mouth or dysphagia, cough, wheezing, skin rash, headache, or seizures. Appetite is voracious.     MEDICAL HISTORY: Past Medical History  Diagnosis Date  . Diverticulosis   . Hemorrhoids   . Hx of  adenomatous colonic polyps   . Hiatal hernia   . Hypertension   . Hyperlipidemia   . GERD (gastroesophageal reflux disease)   . Chronic kidney disease     kidney stone s/p stent placement ( removed)  . Complication of anesthesia   . PONV (postoperative nausea and vomiting)   . Bronchitis, chronic   . Chronic back pain   . Lumbar radicular pain   . Compression fracture of L3 lumbar vertebra   . T7 vertebral fracture   . Smoldering myeloma     INTERIM HISTORY: has Benign neoplasm of colon; Esophageal reflux; Other dysphagia; Lumbar compression fracture; Compression fracture; Hypercalcemia; Hypokalemia; Sepsis; Smoldering myeloma; and Epidural abscess, L2-L5 on her problem list.    ALLERGIES:  is allergic to morphine and related and tetracyclines & related.  MEDICATIONS: has a current medication list which includes the following prescription(s): dexamethasone, diazepam, doxycycline, feeding supplement, guaifenesin, hydrocodone-acetaminophen, omeprazole, oxycodone, oxycodone, potassium chloride sa, senna-docusate, and cyclobenzaprine.  SURGICAL HISTORY:  Past Surgical History  Procedure Laterality Date  . Cholecystectomy    . Esophagogastroduodenoscopy  12/18/2011    Procedure: ESOPHAGOGASTRODUODENOSCOPY (EGD);  Surgeon: Lafayette Dragon, MD;  Location: [facility];  Service: Endoscopy;  Laterality: N/A;  . Colonoscopy  12/18/2011    Procedure: COLONOSCOPY;  Surgeon: Lafayette Dragon, MD;  Location: [facility];  Service: Endoscopy;  Laterality: N/A;  . Ureteral stent placement    . Vertebroplasty N/A 07/14/2013    Procedure: VERTEBROPLASTY WITH LUMBAR THREE BIOPSY;  Surgeon: Charlie Pitter, MD;  Location: [facility];  Service: Neurosurgery;  Laterality: N/A;  VERTEBROPLASTY WITH LUMBAR THREE BIOPSY  . Back surgery    . Lumbar laminectomy for epidural  abscess Left 09/22/2013    Procedure: LUMBAR LAMINECTOMY FOR EPIDURAL ABSCESS LEFT LUMBAR FIVE-SACRAL ONE;  Surgeon: Charlie Pitter, MD;   Location: [facility];  Service: Neurosurgery;  Laterality: Left;    FAMILY HISTORY: family history includes Breast cancer (age of onset: 32) in her sister; Breast cancer (age of onset: 61) in her other.  SOCIAL HISTORY:  reports that she has never smoked. She has never used smokeless tobacco. She reports that she drinks alcohol. She reports that she does not use illicit drugs.  REVIEW OF SYSTEMS:  Other than that discussed above is noncontributory.  PHYSICAL EXAMINATION: ECOG PERFORMANCE STATUS: 1 - Symptomatic but completely ambulatory  Blood pressure 133/68, pulse 65, temperature 97.9 F (36.6 C), temperature source Oral, resp. rate 16, weight 168 lb 9.6 oz (76.476 kg), SpO2 98.00%.  GENERAL:alert, no distress and comfortable. Using a cane for support. SKIN: skin color, texture, turgor are normal, no rashes or significant lesions EYES: PERLA; Conjunctiva are pink and non-injected, sclera clear SINUSES: No redness or tenderness over maxillary or ethmoid sinuses OROPHARYNX:no exudate, no erythema on lips, buccal mucosa, or tongue. NECK: supple, thyroid normal size, non-tender, without nodularity. No masses CHEST: Normal AP diameter with no breast masses. LYMPH:  no palpable lymphadenopathy in the cervical, axillary or inguinal LUNGS: clear to auscultation and percussion with normal breathing effort HEART: regular rate & rhythm and no murmurs. ABDOMEN:abdomen soft, non-tender and normal bowel sounds MUSCULOSKELETAL:no cyanosis of digits and no clubbing. Range of motion normal. Tenderness over the lumbar spine NEURO: alert & oriented x 3 with fluent speech, no focal motor/sensory deficits. Bilateral lower 70 hyperreflexia   LABORATORY DATA: Lab on 11/07/2013  Component Date Value Ref Range Status  . WBC 11/07/2013 10.1  4.0 - 10.5 K/uL Final  . RBC 11/07/2013 4.58  3.87 - 5.11 MIL/uL Final  . Hemoglobin 11/07/2013 11.7* 12.0 - 15.0 g/dL Final  . HCT 11/07/2013 37.6  36.0 - 46.0 %  Final  . MCV 11/07/2013 82.1  78.0 - 100.0 fL Final  . MCH 11/07/2013 25.5* 26.0 - 34.0 pg Final  . MCHC 11/07/2013 31.1  30.0 - 36.0 g/dL Final  . RDW 11/07/2013 16.4* 11.5 - 15.5 % Final  . Platelets 11/07/2013 312  150 - 400 K/uL Final  . Neutrophils Relative % 11/07/2013 95* 43 - 77 % Final  . Neutro Abs 11/07/2013 9.5* 1.7 - 7.7 K/uL Final  . Lymphocytes Relative 11/07/2013 4* 12 - 46 % Final  . Lymphs Abs 11/07/2013 0.4* 0.7 - 4.0 K/uL Final  . Monocytes Relative 11/07/2013 1* 3 - 12 % Final  . Monocytes Absolute 11/07/2013 0.1  0.1 - 1.0 K/uL Final  . Eosinophils Relative 11/07/2013 0  0 - 5 % Final  . Eosinophils Absolute 11/07/2013 0.0  0.0 - 0.7 K/uL Final  . Basophils Relative 11/07/2013 0  0 - 1 % Final  . Basophils Absolute 11/07/2013 0.0  0.0 - 0.1 K/uL Final  . Sodium 11/07/2013 140  137 - 147 mEq/L Final  . Potassium 11/07/2013 4.7  3.7 - 5.3 mEq/L Final  . Chloride 11/07/2013 104  96 - 112 mEq/L Final  . CO2 11/07/2013 25  19 - 32 mEq/L Final  . Glucose, Bld 11/07/2013 152* 70 - 99 mg/dL Final  . BUN 11/07/2013 20  6 - 23 mg/dL Final  . Creatinine, Ser 11/07/2013 0.70  0.50 - 1.10 mg/dL Final  . Calcium 11/07/2013 10.2  8.4 - 10.5 mg/dL Final  . Total Protein  11/07/2013 6.7  6.0 - 8.3 g/dL Final  . Albumin 11/07/2013 3.4* 3.5 - 5.2 g/dL Final  . AST 11/07/2013 16  0 - 37 U/L Final  . ALT 11/07/2013 14  0 - 35 U/L Final  . Alkaline Phosphatase 11/07/2013 50  39 - 117 U/L Final  . Total Bilirubin 11/07/2013 0.4  0.3 - 1.2 mg/dL Final  . GFR calc non Af Amer 11/07/2013 >90  >90 mL/min Final  . GFR calc Af Amer 11/07/2013 >90  >90 mL/min Final   Comment: (NOTE)                          The eGFR has been calculated using the CKD EPI equation.                          This calculation has not been validated in all clinical situations.                          eGFR's persistently <90 mL/min signify possible Chronic Kidney                          Disease.  . Anion gap  11/07/2013 11  5 - 15 Final  . Total Protein 11/07/2013 PENDING  6.0 - 8.3 g/dL Incomplete  . Albumin ELP 11/07/2013 PENDING  55.8 - 66.1 % Incomplete  . Alpha-1-Globulin 11/07/2013 PENDING  2.9 - 4.9 % Incomplete  . Alpha-2-Globulin 11/07/2013 PENDING  7.1 - 11.8 % Incomplete  . Beta Globulin 11/07/2013 PENDING  4.7 - 7.2 % Incomplete  . Beta 2 11/07/2013 PENDING  3.2 - 6.5 % Incomplete  . Gamma Globulin 11/07/2013 PENDING  11.1 - 18.8 % Incomplete  . M-Spike, % 11/07/2013 PENDING   Incomplete  . SPE Interp. 11/07/2013 PENDING   Incomplete  . Comment 11/07/2013 PENDING   Incomplete  . IgM (Immunoglobin M), Srm 11/07/2013 A  60 - 263 mg/dL Corrected  . IgG (Immunoglobin G), Serum 11/07/2013 PENDING  694 - 1618 mg/dL Incomplete  . IgA 11/07/2013 PENDING  68 - 378 mg/dL Incomplete  . IgM, Serum 11/07/2013 PENDING  60 - 263 mg/dL Incomplete  . Immunofix Electr Int 11/07/2013 PENDING   Incomplete    PATHOLOGY: No new pathology.  Urinalysis    Component Value Date/Time   COLORURINE YELLOW 09/20/2013 2334   APPEARANCEUR CLOUDY* 09/20/2013 2334   LABSPEC 1.017 09/20/2013 2334   PHURINE 6.0 09/20/2013 2334   GLUCOSEU NEGATIVE 09/20/2013 2334   HGBUR NEGATIVE 09/20/2013 2334   BILIRUBINUR NEGATIVE 09/20/2013 2334   KETONESUR NEGATIVE 09/20/2013 2334   PROTEINUR NEGATIVE 09/20/2013 2334   UROBILINOGEN 0.2 09/20/2013 2334   NITRITE NEGATIVE 09/20/2013 2334   LEUKOCYTESUR NEGATIVE 09/20/2013 2334    RADIOGRAPHIC STUDIES: No results found.  ASSESSMENT:  #1. Smoldering myeloma, awaiting today's additional lab tests.  #2. Status post lumbar laminectomy for MRSA epidural abscess, status post  intravenous vancomycin. Previous L3 vertebroplasty and radiotherapy ending about 3 weeks ago.  #3. Hypertension, controlled   PLAN:  #1. Continue doxycycline twice a day. #2. Hydrocodone 10/APAP 325 up to 2 every 4 hours to control pain. #3. Decrease dexamethasone to 4 mg every other day for 2 weeks and  then discontinue. #4. Continue physical therapy with recommendation to reapply for disability. #5. Followup in one month with CBC, chem profile, myeloma panel, light chains.  All questions were answered. The patient knows to call the clinic with any problems, questions or concerns. We can certainly see the patient much sooner if necessary.   I spent 30 minutes counseling the patient face to face. The total time spent in the appointment was 40 minutes.    Doroteo Bradford, MD 11/07/2013 3:21 PM  DISCLAIMER:  This note was dictated with voice recognition software.  Similar sounding words can inadvertently be transcribed inaccurately and may not be corrected upon review.

## 2013-11-07 NOTE — Addendum Note (Signed)
Addended by: Mellissa Kohut on: 11/07/2013 03:22 PM   Modules accepted: Orders

## 2013-11-07 NOTE — Telephone Encounter (Signed)
Patient instructed per Dr. Idamae Lusher instruction to decrease decadron to 4 mg every other day for 2 weeks then discontinue.  Verbalized understanding of instructions.

## 2013-11-10 LAB — KAPPA/LAMBDA LIGHT CHAINS
Kappa free light chain: 1.14 mg/dL (ref 0.33–1.94)
Kappa, lambda light chain ratio: 0.8 (ref 0.26–1.65)
Lambda free light chains: 1.43 mg/dL (ref 0.57–2.63)

## 2013-11-11 LAB — MULTIPLE MYELOMA PANEL, SERUM
Albumin ELP: 58.8 % (ref 55.8–66.1)
Alpha-1-Globulin: 5.9 % — ABNORMAL HIGH (ref 2.9–4.9)
Alpha-2-Globulin: 11.6 % (ref 7.1–11.8)
Beta 2: 4.5 % (ref 3.2–6.5)
Beta Globulin: 7.8 % — ABNORMAL HIGH (ref 4.7–7.2)
Gamma Globulin: 11.4 % (ref 11.1–18.8)
IgA: 168 mg/dL (ref 69–380)
IgG (Immunoglobin G), Serum: 743 mg/dL (ref 690–1700)
IgM, Serum: 56 mg/dL — ABNORMAL LOW (ref 52–322)
M-Spike, %: NOT DETECTED g/dL
Total Protein: 6.3 g/dL (ref 6.0–8.3)

## 2013-11-17 ENCOUNTER — Other Ambulatory Visit: Payer: Self-pay | Admitting: Gynecology

## 2013-11-18 ENCOUNTER — Encounter: Payer: Self-pay | Admitting: Internal Medicine

## 2013-11-18 LAB — CYTOLOGY - PAP

## 2013-11-28 LAB — IMMUNOFIXATION ELECTROPHORESIS
IgA: 162 mg/dL (ref 69–380)
IgG (Immunoglobin G), Serum: 828 mg/dL (ref 690–1700)
IgM, Serum: 58 mg/dL — ABNORMAL LOW (ref 52–322)

## 2013-12-03 ENCOUNTER — Other Ambulatory Visit: Payer: Self-pay

## 2013-12-03 DIAGNOSIS — Z1239 Encounter for other screening for malignant neoplasm of breast: Secondary | ICD-10-CM

## 2013-12-08 ENCOUNTER — Encounter (HOSPITAL_COMMUNITY): Payer: Self-pay

## 2013-12-08 ENCOUNTER — Encounter (HOSPITAL_BASED_OUTPATIENT_CLINIC_OR_DEPARTMENT_OTHER): Payer: BC Managed Care – PPO

## 2013-12-08 ENCOUNTER — Encounter (HOSPITAL_COMMUNITY): Payer: BC Managed Care – PPO | Attending: Hematology and Oncology

## 2013-12-08 VITALS — BP 125/61 | HR 85 | Temp 98.6°F | Resp 18 | Wt 175.0 lb

## 2013-12-08 DIAGNOSIS — C9000 Multiple myeloma not having achieved remission: Secondary | ICD-10-CM

## 2013-12-08 DIAGNOSIS — D72822 Plasmacytosis: Secondary | ICD-10-CM

## 2013-12-08 DIAGNOSIS — I10 Essential (primary) hypertension: Secondary | ICD-10-CM

## 2013-12-08 DIAGNOSIS — G061 Intraspinal abscess and granuloma: Secondary | ICD-10-CM

## 2013-12-08 LAB — COMPREHENSIVE METABOLIC PANEL
ALT: 13 U/L (ref 0–35)
AST: 15 U/L (ref 0–37)
Albumin: 3.2 g/dL — ABNORMAL LOW (ref 3.5–5.2)
Alkaline Phosphatase: 58 U/L (ref 39–117)
Anion gap: 10 (ref 5–15)
BUN: 23 mg/dL (ref 6–23)
CO2: 26 mEq/L (ref 19–32)
Calcium: 10.4 mg/dL (ref 8.4–10.5)
Chloride: 104 mEq/L (ref 96–112)
Creatinine, Ser: 0.81 mg/dL (ref 0.50–1.10)
GFR calc Af Amer: 88 mL/min — ABNORMAL LOW (ref >90)
GFR calc non Af Amer: 76 mL/min — ABNORMAL LOW (ref >90)
Glucose, Bld: 112 mg/dL — ABNORMAL HIGH (ref 70–99)
Potassium: 4 mEq/L (ref 3.7–5.3)
Sodium: 140 mEq/L (ref 137–147)
Total Bilirubin: 0.2 mg/dL — ABNORMAL LOW (ref 0.3–1.2)
Total Protein: 6.7 g/dL (ref 6.0–8.3)

## 2013-12-08 LAB — CBC WITH DIFFERENTIAL/PLATELET
Basophils Absolute: 0 10*3/uL (ref 0.0–0.1)
Basophils Relative: 1 % (ref 0–1)
Eosinophils Absolute: 0.2 10*3/uL (ref 0.0–0.7)
Eosinophils Relative: 2 % (ref 0–5)
HCT: 37.3 % (ref 36.0–46.0)
Hemoglobin: 11.8 g/dL — ABNORMAL LOW (ref 12.0–15.0)
Lymphocytes Relative: 19 % (ref 12–46)
Lymphs Abs: 1.7 10*3/uL (ref 0.7–4.0)
MCH: 25.7 pg — ABNORMAL LOW (ref 26.0–34.0)
MCHC: 31.6 g/dL (ref 30.0–36.0)
MCV: 81.1 fL (ref 78.0–100.0)
Monocytes Absolute: 0.5 10*3/uL (ref 0.1–1.0)
Monocytes Relative: 5 % (ref 3–12)
Neutro Abs: 6.5 10*3/uL (ref 1.7–7.7)
Neutrophils Relative %: 74 % (ref 43–77)
Platelets: 328 10*3/uL (ref 150–400)
RBC: 4.6 MIL/uL (ref 3.87–5.11)
RDW: 15.7 % — ABNORMAL HIGH (ref 11.5–15.5)
WBC: 8.9 10*3/uL (ref 4.0–10.5)

## 2013-12-08 MED ORDER — HYDROCODONE-ACETAMINOPHEN 10-325 MG PO TABS: ORAL_TABLET | ORAL | Status: DC

## 2013-12-08 MED ORDER — OXYCODONE HCL ER 20 MG PO T12A: 20.0000 mg | EXTENDED_RELEASE_TABLET | Freq: Two times a day (BID) | ORAL | Status: DC

## 2013-12-08 MED ORDER — DIAZEPAM 5 MG PO TABS: 2.5000 mg | ORAL_TABLET | Freq: Three times a day (TID) | ORAL | Status: DC | PRN

## 2013-12-08 NOTE — Progress Notes (Signed)
[facility]  OFFICE PROGRESS NOTE  [name],[name], MD [place] [address]  DIAGNOSIS: Plasmacytosis - Plan: CBC with Differential, Comprehensive metabolic panel, Beta 2 microglobuline, serum, Kappa/lambda light chains, Lactate dehydrogenase  Abscess in epidural space of L2-L5 lumbar spine  Hypercalcemia  Chief Complaint  Patient presents with   Plasmacytosis lumbar spine    Status post kyphoplasty followed by epidural abscess, status post drainage and antibiotic therapy    CURRENT THERAPY: Status post kyphoplasty at which time plasmacytosis was found in the lumbar spine, status post external beam radiotherapy to the lumbar spine treatment ending in Duran 2015, status post decompression laminectomy on 09/22/2013 with MRSA epidural abscess; vancomycin every 12 hours through a right upper extremity PICC line followed by doxycycline 100 mg twice a day while continuing on physical therapy with last neurosurgical followup scheduled for 12/25/2013.   INTERVAL HISTORY: Autumn Duran 63 y.o. female returns for  followup a plasmacytoma involving L3 found at the time of vertebroplasty with final workup consistent with smoldering myeloma with kappa/lambda ratio 0.84 last measured on 08/19/2013 with negative bone survey and no evidence of renal impairment or hypercalcemia. Following peeling of postoperative radiotherapy ending in Duran 2015, the patient developed worsening back pain and was found to have an epidural abscess and underwent decompression laminectomy on 09/22/2013 with MRSA, treated with vancomycin every 12 hours through a right upper extremity PICC line initially, having completed doxycycline 100 mg twice a day last seen by neurosurgery on 11/06/2013 with followup appointment scheduled for 12/25/2013. She continues to have chronic back pain but no lower extremity paresthesias. Appetite is excellent. Denies any sore throat, fever, night sweats,  cough, wheezing, headache, lower extremity weakness, diarrhea, constipation, dysuria, hematuria, incontinence, vaginal discharge or bleeding, sore mouth, skin rash, or seizures.   MEDICAL HISTORY: Past Medical History  Diagnosis Date   Diverticulosis    Hemorrhoids    Hx of adenomatous colonic polyps    Hiatal hernia    Hypertension    Hyperlipidemia    GERD (gastroesophageal reflux disease)    Chronic kidney disease     kidney stone s/p stent placement ( removed)   Complication of anesthesia    PONV (postoperative nausea and vomiting)    Bronchitis, chronic    Chronic back pain    Lumbar radicular pain    Compression fracture of L3 lumbar vertebra    T7 vertebral fracture    Smoldering myeloma     INTERIM HISTORY: has Benign neoplasm of colon; Esophageal reflux; Other dysphagia; Lumbar compression fracture; Compression fracture; Hypercalcemia; Hypokalemia; Sepsis; Smoldering myeloma; and Epidural abscess, L2-L5 on her problem list.    ALLERGIES:  is allergic to morphine and related and tetracyclines & related.  MEDICATIONS: has a current medication list which includes the following prescription(s): diazepam, feeding supplement, guaifenesin, hydrocodone-acetaminophen, omeprazole, oxycodone, oxycodone, senna-docusate, cyclobenzaprine, and potassium chloride sa.  SURGICAL HISTORY:  Past Surgical History  Procedure Laterality Date   Cholecystectomy     Esophagogastroduodenoscopy  12/18/2011    Procedure: ESOPHAGOGASTRODUODENOSCOPY (EGD);  Surgeon: Lafayette Dragon, MD;  Location: [facility];  Service: Endoscopy;  Laterality: N/A;   Colonoscopy  12/18/2011    Procedure: COLONOSCOPY;  Surgeon: Lafayette Dragon, MD;  Location: [facility];  Service: Endoscopy;  Laterality: N/A;   Ureteral stent placement     Vertebroplasty N/A 07/14/2013    Procedure: VERTEBROPLASTY WITH LUMBAR THREE BIOPSY;  Surgeon: Charlie Pitter, MD;  Location: [facility];  Service:  Neurosurgery;  Laterality: N/A;  VERTEBROPLASTY WITH LUMBAR THREE BIOPSY   Back surgery     Lumbar laminectomy for epidural abscess Left 09/22/2013    Procedure: LUMBAR LAMINECTOMY FOR EPIDURAL ABSCESS LEFT LUMBAR FIVE-SACRAL ONE;  Surgeon: Charlie Pitter, MD;  Location: [facility];  Service: Neurosurgery;  Laterality: Left;    FAMILY HISTORY: family history includes Breast cancer (age of onset: 37) in her sister; Breast cancer (age of onset: 61) in her other.  SOCIAL HISTORY:  reports that she has never smoked. She has never used smokeless tobacco. She reports that she drinks alcohol. She reports that she does not use illicit drugs.  REVIEW OF SYSTEMS:  Other than that discussed above is noncontributory.  PHYSICAL EXAMINATION: ECOG PERFORMANCE STATUS: 1 - Symptomatic but completely ambulatory  Blood pressure 125/61, pulse 85, temperature 98.6 F (37 C), temperature source Oral, resp. rate 18, weight 175 lb (79.379 kg).  GENERAL:alert, no distress and comfortable SKIN: skin color, texture, turgor are normal, no rashes or significant lesions EYES: PERLA; Conjunctiva are pink and non-injected, sclera clear SINUSES: No redness or tenderness over maxillary or ethmoid sinuses OROPHARYNX:no exudate, no erythema on lips, buccal mucosa, or tongue. NECK: supple, thyroid normal size, non-tender, without nodularity. No masses CHEST: Normal AP diameter with no breast masses. LYMPH:  no palpable lymphadenopathy in the cervical, axillary or inguinal LUNGS: clear to auscultation and percussion with normal breathing effort HEART: regular rate & rhythm and no murmurs. ABDOMEN:abdomen soft, non-tender and normal bowel sounds MUSCULOSKELETAL:no cyanosis of digits and no clubbing. Range of motion normal.  NEURO: alert & oriented x 3 with fluent speech, no focal motor/sensory deficits. Bilateral lower 70 hyperreflexia.   LABORATORY DATA:  Results for Autumn, Duran (MRN [id number]) as of 12/08/2013  11:24  Ref. Range 09/24/2013 05:28 09/25/2013 04:45 09/26/2013 05:15 10/07/2013 12:59 11/07/2013 13:30  Calcium Latest Range: 8.4-10.5 mg/dL 10.2 9.3 9.7 10.6 (H) 10.2     Results for Autumn, Duran (MRN [id number]) as of 12/08/2013 11:24  Ref. Range 08/19/2013 16:25 10/07/2013 12:59 11/07/2013 13:30  Kappa/ lamda light chain ratio Latest Range: 0.26-1.65  0.84 0.91 0.80   Appointment on 12/08/2013  Component Date Value Ref Range Status   WBC 12/08/2013 8.9  4.0 - 10.5 K/uL Final   RBC 12/08/2013 4.60  3.87 - 5.11 MIL/uL Final   Hemoglobin 12/08/2013 11.8* 12.0 - 15.0 g/dL Final   HCT 12/08/2013 37.3  36.0 - 46.0 % Final   MCV 12/08/2013 81.1  78.0 - 100.0 fL Final   MCH 12/08/2013 25.7* 26.0 - 34.0 pg Final   MCHC 12/08/2013 31.6  30.0 - 36.0 g/dL Final   RDW 12/08/2013 15.7* 11.5 - 15.5 % Final   Platelets 12/08/2013 328  150 - 400 K/uL Final   Neutrophils Relative % 12/08/2013 74  43 - 77 % Final   Neutro Abs 12/08/2013 6.5  1.7 - 7.7 K/uL Final   Lymphocytes Relative 12/08/2013 19  12 - 46 % Final   Lymphs Abs 12/08/2013 1.7  0.7 - 4.0 K/uL Final   Monocytes Relative 12/08/2013 5  3 - 12 % Final   Monocytes Absolute 12/08/2013 0.5  0.1 - 1.0 K/uL Final   Eosinophils Relative 12/08/2013 2  0 - 5 % Final   Eosinophils Absolute 12/08/2013 0.2  0.0 - 0.7 K/uL Final   Basophils Relative 12/08/2013 1  0 - 1 % Final   Basophils Absolute 12/08/2013 0.0  0.0 - 0.1 K/uL  Final   Sodium 12/08/2013 140  137 - 147 mEq/L Final   Potassium 12/08/2013 4.0  3.7 - 5.3 mEq/L Final   Chloride 12/08/2013 104  96 - 112 mEq/L Final   CO2 12/08/2013 26  19 - 32 mEq/L Final   Glucose, Bld 12/08/2013 112* 70 - 99 mg/dL Final   BUN 12/08/2013 23  6 - 23 mg/dL Final   Creatinine, Ser 12/08/2013 0.81  0.50 - 1.10 mg/dL Final   Calcium 12/08/2013 10.4  8.4 - 10.5 mg/dL Final   Total Protein 12/08/2013 6.7  6.0 - 8.3 g/dL Final   Albumin 12/08/2013 3.2* 3.5 - 5.2 g/dL Final   AST  12/08/2013 15  0 - 37 U/L Final   ALT 12/08/2013 13  0 - 35 U/L Final   Alkaline Phosphatase 12/08/2013 58  39 - 117 U/L Final   Total Bilirubin 12/08/2013 0.2* 0.3 - 1.2 mg/dL Final   GFR calc non Af Amer 12/08/2013 76* >90 mL/min Final   GFR calc Af Amer 12/08/2013 88* >90 mL/min Final   Comment: (NOTE)                          The eGFR has been calculated using the CKD EPI equation.                          This calculation has not been validated in all clinical situations.                          eGFR's persistently <90 mL/min signify possible Chronic Kidney                          Disease.   Anion gap 12/08/2013 10  5 - 15 Final   Total Protein 12/08/2013 PENDING  6.0 - 8.3 g/dL Incomplete   Albumin ELP 12/08/2013 PENDING  55.8 - 66.1 % Incomplete   Alpha-1-Globulin 12/08/2013 PENDING  2.9 - 4.9 % Incomplete   Alpha-2-Globulin 12/08/2013 PENDING  7.1 - 11.8 % Incomplete   Beta Globulin 12/08/2013 PENDING  4.7 - 7.2 % Incomplete   Beta 2 12/08/2013 PENDING  3.2 - 6.5 % Incomplete   Gamma Globulin 12/08/2013 PENDING  11.1 - 18.8 % Incomplete   M-Spike, % 12/08/2013 PENDING   Incomplete   SPE Interp. 12/08/2013 PENDING   Incomplete   Comment 12/08/2013 PENDING   Incomplete   IgG (Immunoglobin G), Serum 12/08/2013 PENDING  694 - 1618 mg/dL Incomplete   IgA 12/08/2013 PENDING  68 - 378 mg/dL Incomplete   IgM, Serum 12/08/2013 PENDING  60 - 263 mg/dL Incomplete   Immunofix Electr Int 12/08/2013 PENDING   Incomplete  Orders Only on 11/17/2013  Component Date Value Ref Range Status   CYTOLOGY - PAP 11/17/2013 PAP RESULT   Final    PATHOLOGY:  for Autumn, Duran ([id number]) Patient: Autumn Duran Collected: 07/14/2013 Client: [facility] Accession: [id number] Received: 07/15/2013 Autumn Duran DOB: 10-06-1950 Age: 28 Gender: F Reported: 07/18/2013 [address] Patient Ph: [phone] MRN #: [id number] [place], [place] [address] Visit #: [id number]  Chart #: Phone:  Fax: CC: Donato Heinz, MD REPORT OF SURGICAL PATHOLOGY FINAL DIAGNOSIS Diagnosis Bone, biopsy, lumbar three - LAMELLAR BONE WITH ASSOCIATED FIBROUS TISSUE AND INCREASED PLASMA CELLS. - THERE IS NO EVIDENCE OF MALIGNANCY. - SEE COMMENT. Microscopic Comment Pending immunohistochemical stains. Diagnosis  Note There are scattered plasma cells (approximately 5-10%), however, no aggregates are seen. Light chain immunohistochemistry is technically unsatisfactory. Pancytokeratin is negative. Correlation with clinical, radiographic, and laboratory data (SPEP, etc.) is recommended. Vicente Males MD Pathologist, Electronic Signature (Case signed 07/18/2013) Specimen Gross and Clinical Information Specimen(s) Obtained: Bone, biopsy, lumbar three Specimen Clinical Information Lumbar three fracture (tl) Gross Received fresh is a 1 cm in length and 0.3 cm in diameter core of tan-yellow to hyperemic firm bone, submitted in one block following decalcification. (SSW:ecj 07/15/2013) Stain(s) used in Diagnosis: The following stain(s) were used in diagnosing the case: LAMBDA, KAPPA, CD 138, CK AE1AE3. The control(s) stained appropriately. 1 of 2 FINAL for Autumn, Duran ([id number]) Disclaimer Some of these immunohistochemical stains may have been developed and the performance characteristics determined by [facility]. Some may not have been cleared or approved by the U.S. Food and Drug Administration. The FDA has determined that such clearance or approval is not necessary. This test is used for clinical purposes. It should not be regarded as investigational or for research. This laboratory is certified under the [place] (CLIA-88) as qualified to perform high complexity clinical laboratory testing. Report signed out from the following location(s) Technical Component performed at [facility] [address], [place], [place] [address]. CLIA #: [id number], Technical Component performed at [facility], [place], [place] [address]. CLIA #: [id number], Interpretation performed at [facility], [place], [place] [address]. CLIA #: [id number],    Urinalysis    Component Value Date/Time   COLORURINE YELLOW 09/20/2013 2334   APPEARANCEUR CLOUDY* 09/20/2013 2334   LABSPEC 1.017 09/20/2013 2334   PHURINE 6.0 09/20/2013 2334   GLUCOSEU NEGATIVE 09/20/2013 2334   HGBUR NEGATIVE 09/20/2013 2334   BILIRUBINUR NEGATIVE 09/20/2013 2334   KETONESUR NEGATIVE 09/20/2013 2334   PROTEINUR NEGATIVE 09/20/2013 2334   UROBILINOGEN 0.2 09/20/2013 2334   NITRITE NEGATIVE 09/20/2013 2334   LEUKOCYTESUR NEGATIVE 09/20/2013 2334    RADIOGRAPHIC STUDIES: No results found.  ASSESSMENT:  #1. Smoldering myeloma, awaiting today's additional lab tests.  #2. Status post lumbar laminectomy for MRSA epidural abscess, status post intravenous vancomycin. Previous L3 vertebroplasty and radiotherapy ending about 7 weeks ago, antibiotic therapy with oral doxycycline finished yesterday. #3. Hypertension, controlled    PLAN:  #1. OxyContin 20 every 12 hours with hydrocodone 10/APAP 325 for breakthrough pain up to every 4 hours as needed. #2. Continue physical therapy. #3. Followup with Dr. Megan Salon later this week. #4. Followup with Dr. Annette Stable 12/25/2013. #5. Will probably return to work in November 2015. #6. Patient refused influenza virus vaccine #7. Followup here in 4 weeks with CBC, chem profile, LDH, beta-2 microglobulin, light chains.   All questions were answered. The patient knows to call the clinic with any problems, questions or concerns. We can certainly see the patient much sooner if necessary.   I spent 25 minutes counseling the patient face to face. The total time spent in the appointment was 30 minutes.    Doroteo Bradford, MD 12/08/2013 12:06  PM  DISCLAIMER:  This note was dictated with voice recognition software.  Similar sounding words can inadvertently be transcribed inaccurately and may not be corrected upon review.

## 2013-12-08 NOTE — Patient Instructions (Signed)
[facility] Discharge Instructions  RECOMMENDATIONS MADE BY THE CONSULTANT AND ANY TEST RESULTS WILL BE SENT TO YOUR REFERRING PHYSICIAN.  Return in one month for office visit and blood work.  Thank you for choosing [facility] to provide your oncology and hematology care.  To afford each patient quality time with our providers, please arrive at least 15 minutes before your scheduled appointment time.  With your help, our goal is to use those 15 minutes to complete the necessary work-up to ensure our physicians have the information they need to help with your evaluation and healthcare recommendations.    Effective January 1st, 2014, we ask that you re-schedule your appointment with our physicians should you arrive 10 or more minutes late for your appointment.  We strive to give you quality time with our providers, and arriving late affects you and other patients whose appointments are after yours.    Again, thank you for choosing [facility].  Our hope is that these requests will decrease the amount of time that you wait before being seen by our physicians.       _____________________________________________________________  Should you have questions after your visit to [facility], please contact our office at [phone] between the hours of 8:30 a.m. and 4:30 p.m.  Voicemails left after 4:30 p.m. will not be returned until the following business day.  For prescription refill requests, have your pharmacy contact our office with your prescription refill request.    _______________________________________________________________  We hope that we have given you very good care.  You may receive a patient satisfaction survey in the mail, please complete it and return it as soon as possible.  We value your feedback!  _______________________________________________________________  Have you asked about our STAR program?  STAR stands for  Survivorship Training and Rehabilitation, and this is a nationally recognized cancer care program that focuses on survivorship and rehabilitation.  Cancer and cancer treatments may cause problems, such as, pain, making you feel tired and keeping you from doing the things that you need or want to do. Cancer rehabilitation can help. Our goal is to reduce these troubling effects and help you have the best quality of life possible.  You may receive a survey from a nurse that asks questions about your current state of health.  Based on the survey results, all eligible patients will be referred to the [facility] program for an evaluation so we can better serve you!  A frequently asked questions sheet is available upon request.

## 2013-12-08 NOTE — Progress Notes (Signed)
Labs for mm,kllc,cbcd,cmp

## 2013-12-09 LAB — KAPPA/LAMBDA LIGHT CHAINS
Kappa free light chain: 1.69 mg/dL (ref 0.33–1.94)
Kappa, lambda light chain ratio: 0.73 (ref 0.26–1.65)
Lambda free light chains: 2.31 mg/dL (ref 0.57–2.63)

## 2013-12-10 LAB — MULTIPLE MYELOMA PANEL, SERUM
Albumin ELP: 55.2 % — ABNORMAL LOW (ref 55.8–66.1)
Alpha-1-Globulin: 8.5 % — ABNORMAL HIGH (ref 2.9–4.9)
Alpha-2-Globulin: 11.9 % — ABNORMAL HIGH (ref 7.1–11.8)
Beta 2: 3.8 % (ref 3.2–6.5)
Beta Globulin: 8.2 % — ABNORMAL HIGH (ref 4.7–7.2)
Gamma Globulin: 12.4 % (ref 11.1–18.8)
IgA: 181 mg/dL (ref 69–380)
IgG (Immunoglobin G), Serum: 819 mg/dL (ref 690–1700)
IgM, Serum: 121 mg/dL (ref 52–322)
M-Spike, %: NOT DETECTED g/dL
Total Protein: 6 g/dL (ref 6.0–8.3)

## 2013-12-11 ENCOUNTER — Encounter: Payer: Self-pay | Admitting: Internal Medicine

## 2013-12-11 ENCOUNTER — Ambulatory Visit (INDEPENDENT_AMBULATORY_CARE_PROVIDER_SITE_OTHER): Payer: BC Managed Care – PPO | Admitting: Internal Medicine

## 2013-12-11 VITALS — BP 123/73 | HR 76 | Temp 98.2°F | Wt 177.0 lb

## 2013-12-11 DIAGNOSIS — G061 Intraspinal abscess and granuloma: Secondary | ICD-10-CM

## 2013-12-11 NOTE — Progress Notes (Signed)
Patient ID: Autumn Duran, female   DOB: 1950-12-06, 63 y.o.   MRN: [id number]         [facility] for Infectious Disease  Patient Active Problem List   Diagnosis Date Noted  . Epidural abscess, L2-L5 11/04/2013  . Smoldering myeloma 09/22/2013  . Sepsis 09/21/2013  . Hypercalcemia 08/20/2013  . Hypokalemia 08/20/2013  . Lumbar compression fracture 07/12/2013  . Compression fracture 07/12/2013  . Benign neoplasm of colon 12/18/2011  . Esophageal reflux 12/18/2011  . Other dysphagia 12/18/2011    Patient's Medications  New Prescriptions   No medications on file  Previous Medications   CYCLOBENZAPRINE (FLEXERIL) 5 MG TABLET    Take 1 tablet (5 mg total) by mouth 3 (three) times daily as needed for muscle spasms.   DIAZEPAM (VALIUM) 5 MG TABLET    Take 0.5 tablets (2.5 mg total) by mouth every 8 (eight) hours as needed for anxiety.   FEEDING SUPPLEMENT (BOOST HIGH PROTEIN) LIQD    Take 1 Container by mouth 3 (three) times daily between meals.   GUAIFENESIN (MUCINEX) 600 MG 12 HR TABLET    Take 600 mg by mouth daily as needed.   HYDROCODONE-ACETAMINOPHEN (NORCO) 10-325 MG PER TABLET    Take 1 or 2 tablets every 4 hours to control pain   OMEPRAZOLE (PRILOSEC) 40 MG CAPSULE    Take 40 mg by mouth daily as needed (acid reflux).    POTASSIUM CHLORIDE SA (K-DUR,KLOR-CON) 20 MEQ TABLET    Take 1 tablet (20 mEq total) by mouth 2 (two) times daily.   SENNA-DOCUSATE (SENOKOT-S) 8.6-50 MG PER TABLET    Take 2 tablets by mouth daily.  Modified Medications   No medications on file  Discontinued Medications   OXYCODONE (OXY IR/ROXICODONE) 5 MG IMMEDIATE RELEASE TABLET    Date 100 tablets every 4 hours as needed for pain.   OXYCODONE (OXYCONTIN) 20 MG T12A 12 HR TABLET    Take 1 tablet (20 mg total) by mouth every 12 (twelve) hours.    Subjective: Autumn Duran is in for her routine followup visit. She completed doxycycline a little over one week ago. She received a total of 2-1/2 months of  antibiotic therapy for her MRSA lumbar epidural abscess. She is still requiring hydrocodone about 3 daily but has been able to resume many of her usual activities including shopping on weekends and she has started physical therapy. She does note increased soreness and fatigue the day after therapy. She's not had any fever, chills or sweats.  Review of Systems: Pertinent items are noted in HPI.  Past Medical History  Diagnosis Date  . Diverticulosis   . Hemorrhoids   . Hx of adenomatous colonic polyps   . Hiatal hernia   . Hypertension   . Hyperlipidemia   . GERD (gastroesophageal reflux disease)   . Chronic kidney disease     kidney stone s/p stent placement ( removed)  . Complication of anesthesia   . PONV (postoperative nausea and vomiting)   . Bronchitis, chronic   . Chronic back pain   . Lumbar radicular pain   . Compression fracture of L3 lumbar vertebra   . T7 vertebral fracture   . Smoldering myeloma     History  Substance Use Topics  . Smoking status: Never Smoker   . Smokeless tobacco: Never Used  . Alcohol Use: Yes     Comment: social occ    Family History  Problem Relation Age of Onset  . Breast cancer  Sister 66  . Breast cancer Other 46    aunt    Allergies  Allergen Reactions  . Morphine And Related Other (See Comments)    Headache  . Tetracyclines & Related Nausea And Vomiting    Objective: Temp: 98.2 F (36.8 C) (10/15 1353) Temp Source: Oral (10/15 1353) BP: 123/73 mmHg (10/15 1353) Pulse Rate: 76 (10/15 1353)  General: She is alert, comfortable and in good spirits Normal gait    Assessment: She is clinically improved and her sedimentation rate and C-reactive protein are quite low recently. I suspect that her MRSA infection has been cured.  Plan: 1. Continue observation off of antibiotics 2. She refuses influenza vaccination today 3. Followup in 6 weeks   Michel Bickers, MD [facility] [phone] pager   [phone] cell 12/11/2013, 2:09 PM

## 2013-12-18 ENCOUNTER — Other Ambulatory Visit (HOSPITAL_COMMUNITY): Payer: Self-pay | Admitting: Neurosurgery

## 2013-12-18 ENCOUNTER — Ambulatory Visit (HOSPITAL_COMMUNITY)
Admission: RE | Admit: 2013-12-18 | Discharge: 2013-12-18 | Disposition: A | Discharge status: Home / Self Care | Payer: BC Managed Care – PPO | Source: Ambulatory Visit | Attending: Internal Medicine | Admitting: Internal Medicine

## 2013-12-18 ENCOUNTER — Telehealth: Payer: Self-pay | Admitting: *Deleted

## 2013-12-18 DIAGNOSIS — R600 Localized edema: Secondary | ICD-10-CM

## 2013-12-18 DIAGNOSIS — M7989 Other specified soft tissue disorders: Secondary | ICD-10-CM | POA: Insufficient documentation

## 2013-12-18 NOTE — Telephone Encounter (Signed)
Patient called reporting a temperature yesterday and last night (98.9) accompanied with feelings of "burning up, aching all over, really sore hip."  She also reports increased swelling in her foot. She would like to know if she should restart her antibiotics.  She is supposed to return to work 11/2, and must be there by 11/16 or she will lose her job and insurance.  SHe is worried that her infection is coming back and will make her lose her job.  Patient is advised to contact her PCP as well as her Psychologist, sport and exercise.  Will forward to Dr. Megan Salon for any additional advice.  Pt in agreement. Landis Gandy, RN

## 2013-12-18 NOTE — Telephone Encounter (Signed)
I will offer her an appointment early next week.

## 2013-12-18 NOTE — Telephone Encounter (Signed)
Confirmed. She asked about her "fevers", RN advised her to keep track of her temperatures between now and her appointment.

## 2013-12-23 ENCOUNTER — Ambulatory Visit (INDEPENDENT_AMBULATORY_CARE_PROVIDER_SITE_OTHER): Payer: BC Managed Care – PPO | Admitting: Internal Medicine

## 2013-12-23 VITALS — BP 113/75 | HR 90 | Temp 97.8°F | Wt 174.0 lb

## 2013-12-23 DIAGNOSIS — M25551 Pain in right hip: Secondary | ICD-10-CM | POA: Diagnosis not present

## 2013-12-23 LAB — CBC
HCT: 30.2 % — ABNORMAL LOW (ref 36.0–46.0)
Hemoglobin: 9.6 g/dL — ABNORMAL LOW (ref 12.0–15.0)
MCH: 24.6 pg — ABNORMAL LOW (ref 26.0–34.0)
MCHC: 31.8 g/dL (ref 30.0–36.0)
MCV: 77.4 fL — ABNORMAL LOW (ref 78.0–100.0)
Platelets: 537 10*3/uL — ABNORMAL HIGH (ref 150–400)
RBC: 3.9 MIL/uL (ref 3.87–5.11)
RDW: 16.2 % — ABNORMAL HIGH (ref 11.5–15.5)
WBC: 6 10*3/uL (ref 4.0–10.5)

## 2013-12-23 MED ORDER — DOXYCYCLINE HYCLATE 100 MG PO CAPS: 100.0000 mg | ORAL_CAPSULE | Freq: Two times a day (BID) | ORAL | Status: DC

## 2013-12-23 NOTE — Progress Notes (Signed)
Patient ID: Autumn Duran, female   DOB: October 14, 1950, 63 y.o.   MRN: [id number]         [facility]  Patient Active Problem List   Diagnosis Date Noted  . Right hip pain 12/23/2013  . Epidural abscess, L2-L5 11/04/2013  . Smoldering myeloma 09/22/2013  . Sepsis 09/21/2013  . Hypercalcemia 08/20/2013  . Hypokalemia 08/20/2013  . Lumbar compression fracture 07/12/2013  . Compression fracture 07/12/2013  . Benign neoplasm of colon 12/18/2011  . Esophageal reflux 12/18/2011  . Other dysphagia 12/18/2011    Patient's Medications  New Prescriptions   DOXYCYCLINE (VIBRAMYCIN) 100 MG CAPSULE    Take 1 capsule (100 mg total) by mouth 2 (two) times daily.  Previous Medications   DIAZEPAM (VALIUM) 5 MG TABLET    Take 0.5 tablets (2.5 mg total) by mouth every 8 (eight) hours as needed for anxiety.   FEEDING SUPPLEMENT (BOOST HIGH PROTEIN) LIQD    Take 1 Container by mouth 3 (three) times daily between meals.   GUAIFENESIN (MUCINEX) 600 MG 12 HR TABLET    Take 600 mg by mouth daily as needed.   HYDROCODONE-ACETAMINOPHEN (NORCO) 10-325 MG PER TABLET    Take 1 or 2 tablets every 4 hours to control pain   OMEPRAZOLE (PRILOSEC) 40 MG CAPSULE    Take 40 mg by mouth daily as needed (acid reflux).    SENNA-DOCUSATE (SENOKOT-S) 8.6-50 MG PER TABLET    Take 2 tablets by mouth daily.  Modified Medications   No medications on file  Discontinued Medications   CYCLOBENZAPRINE (FLEXERIL) 5 MG TABLET    Take 1 tablet (5 mg total) by mouth 3 (three) times daily as needed for muscle spasms.   POTASSIUM CHLORIDE SA (K-DUR,KLOR-CON) 20 MEQ TABLET    Take 1 tablet (20 mEq total) by mouth 2 (two) times daily.    Subjective: Autumn Duran is seen on a work in basis. She finished doxycycline 3 weeks ago after therapy for her MRSA lumbar epidural abscess. She started having increased right hip pain 4 days ago with subjective fevers and sweats. Review of Systems: Pertinent items are noted in  HPI.  Past Medical History  Diagnosis Date  . Diverticulosis   . Hemorrhoids   . Hx of adenomatous colonic polyps   . Hiatal hernia   . Hypertension   . Hyperlipidemia   . GERD (gastroesophageal reflux disease)   . Chronic kidney disease     kidney stone s/p stent placement ( removed)  . Complication of anesthesia   . PONV (postoperative nausea and vomiting)   . Bronchitis, chronic   . Chronic back pain   . Lumbar radicular pain   . Compression fracture of L3 lumbar vertebra   . T7 vertebral fracture   . Smoldering myeloma     History  Substance Use Topics  . Smoking status: Never Smoker   . Smokeless tobacco: Never Used  . Alcohol Use: Yes     Comment: social occ    Family History  Problem Relation Age of Onset  . Breast cancer Sister 50  . Breast cancer Other 80    aunt    Allergies  Allergen Reactions  . Morphine And Related Other (See Comments)    Headache  . Tetracyclines & Related Nausea And Vomiting    Objective: Temp: 97.8 F (36.6 C) (10/27 1346) Temp Source: Oral (10/27 1346) BP: 113/75 mmHg (10/27 1346) Pulse Rate: 90 (10/27 1346)  General: She looks worried. She  is uncomfortable when moving from seated to standing position Skin: No rash Lungs: Clear Cor: Regular S1 and S2 with no murmurs Abdomen: Obese, soft and nontender Lumbar incision healed. Some point tenderness over right buttock    Assessment: It is possible that her increased pain is due to early relapse of her infection.  Plan: 1. Repeat lumbosacral MRI 2. Blood cultures 2 3. Repeat sedimentation rate and C-reactive protein 4. Restart doxycycline 5. Follow-up in 2 weeks   Michel Bickers, MD [facility] for [place] [phone] pager   [phone] cell 12/23/2013, 2:07 PM

## 2013-12-24 LAB — SEDIMENTATION RATE: Sed Rate: 125 mm/hr — ABNORMAL HIGH (ref 0–22)

## 2013-12-24 LAB — C-REACTIVE PROTEIN: CRP: 14.6 mg/dL — ABNORMAL HIGH (ref <0.60)

## 2013-12-25 ENCOUNTER — Ambulatory Visit (HOSPITAL_COMMUNITY)
Admission: RE | Admit: 2013-12-25 | Discharge: 2013-12-25 | Disposition: A | Discharge status: Home / Self Care | Payer: BC Managed Care – PPO | Source: Ambulatory Visit | Attending: Internal Medicine | Admitting: Internal Medicine

## 2013-12-25 DIAGNOSIS — M25551 Pain in right hip: Secondary | ICD-10-CM | POA: Diagnosis not present

## 2013-12-25 DIAGNOSIS — M545 Low back pain: Secondary | ICD-10-CM | POA: Diagnosis present

## 2013-12-25 DIAGNOSIS — M4656 Other infective spondylopathies, lumbar region: Secondary | ICD-10-CM | POA: Diagnosis not present

## 2013-12-25 DIAGNOSIS — Z9889 Other specified postprocedural states: Secondary | ICD-10-CM | POA: Insufficient documentation

## 2013-12-25 MED ORDER — GADOBENATE DIMEGLUMINE 529 MG/ML IV SOLN
16.0000 mL | Freq: Once | INTRAVENOUS | Status: AC | PRN
  Administered 2013-12-25: 16 mL via INTRAVENOUS

## 2013-12-26 ENCOUNTER — Telehealth: Payer: Self-pay | Admitting: *Deleted

## 2013-12-26 NOTE — Telephone Encounter (Signed)
Patient called for results of MRI that was done yesterday. Please advise

## 2013-12-29 LAB — CULTURE, BLOOD (SINGLE)
Organism ID, Bacteria: NO GROWTH
Organism ID, Bacteria: NO GROWTH

## 2013-12-29 NOTE — Telephone Encounter (Signed)
MRI LUMBAR SPINE WITHOUT AND WITH CONTRAST  TECHNIQUE: Multiplanar and multiecho pulse sequences of the lumbar spine were obtained without and with intravenous contrast.  CONTRAST: 30mL MULTIHANCE GADOBENATE DIMEGLUMINE 529 MG/ML IV SOLN  COMPARISON: Most recent MR 09/21/2013.  IMPRESSION: Improved/resolved appearance status post LEFT L5-L1 hemilaminectomy and resection of LEFT L5 epidural abscess.  Minimal ventral epidural enhancement at L2-L3 is slightly more prominent than priors, but noncompressive, and nonsurgical and nature.  Persistent enhancement of the lumbar vertebrae in the region of the L2-3 and L3-4 disc spaces with slight improvement with regard to the edema enhancement of the superior endplate of L4, basically unchanged appearance of L2 and L3.  Essentially unchanged appearance of septic arthritis at L4-5 on the LEFT. Slight enhancement of the paravertebral soft tissues is likely related to recent surgery.  No new areas of osteomyelitis or suggestion of significant progression of disease.   Electronically Signed  By: Rolla Flatten M.D.  On: 12/25/2013 19:24  I called Ms. Wineland today and reviewed the MRI findings with her. She is still having right hip pain that is unchanged since restarting doxycycline last week. I asked her to continue taking the doxycycline and follow-up with me on November 10.

## 2014-01-05 ENCOUNTER — Encounter (HOSPITAL_COMMUNITY): Payer: BC Managed Care – PPO | Attending: Hematology and Oncology

## 2014-01-05 ENCOUNTER — Encounter (HOSPITAL_COMMUNITY): Payer: Self-pay

## 2014-01-05 ENCOUNTER — Encounter (HOSPITAL_BASED_OUTPATIENT_CLINIC_OR_DEPARTMENT_OTHER): Payer: BC Managed Care – PPO

## 2014-01-05 VITALS — BP 107/59 | HR 81 | Temp 97.8°F | Resp 16 | Wt 171.9 lb

## 2014-01-05 DIAGNOSIS — D638 Anemia in other chronic diseases classified elsewhere: Secondary | ICD-10-CM

## 2014-01-05 DIAGNOSIS — M869 Osteomyelitis, unspecified: Secondary | ICD-10-CM

## 2014-01-05 DIAGNOSIS — G061 Intraspinal abscess and granuloma: Secondary | ICD-10-CM

## 2014-01-05 DIAGNOSIS — C9000 Multiple myeloma not having achieved remission: Secondary | ICD-10-CM

## 2014-01-05 DIAGNOSIS — D72822 Plasmacytosis: Secondary | ICD-10-CM

## 2014-01-05 DIAGNOSIS — I10 Essential (primary) hypertension: Secondary | ICD-10-CM

## 2014-01-05 LAB — CBC WITH DIFFERENTIAL/PLATELET
Basophils Absolute: 0.1 10*3/uL (ref 0.0–0.1)
Basophils Relative: 1 % (ref 0–1)
Eosinophils Absolute: 0.2 10*3/uL (ref 0.0–0.7)
Eosinophils Relative: 3 % (ref 0–5)
HCT: 33.9 % — ABNORMAL LOW (ref 36.0–46.0)
Hemoglobin: 10.5 g/dL — ABNORMAL LOW (ref 12.0–15.0)
Lymphocytes Relative: 31 % (ref 12–46)
Lymphs Abs: 1.9 10*3/uL (ref 0.7–4.0)
MCH: 24.5 pg — ABNORMAL LOW (ref 26.0–34.0)
MCHC: 31 g/dL (ref 30.0–36.0)
MCV: 79 fL (ref 78.0–100.0)
Monocytes Absolute: 0.5 10*3/uL (ref 0.1–1.0)
Monocytes Relative: 9 % (ref 3–12)
Neutro Abs: 3.4 10*3/uL (ref 1.7–7.7)
Neutrophils Relative %: 56 % (ref 43–77)
Platelets: 493 10*3/uL — ABNORMAL HIGH (ref 150–400)
RBC: 4.29 MIL/uL (ref 3.87–5.11)
RDW: 14.9 % (ref 11.5–15.5)
WBC: 6.1 10*3/uL (ref 4.0–10.5)

## 2014-01-05 LAB — COMPREHENSIVE METABOLIC PANEL
ALT: 13 U/L (ref 0–35)
AST: 24 U/L (ref 0–37)
Albumin: 3.1 g/dL — ABNORMAL LOW (ref 3.5–5.2)
Alkaline Phosphatase: 69 U/L (ref 39–117)
Anion gap: 9 (ref 5–15)
BUN: 22 mg/dL (ref 6–23)
CO2: 25 mEq/L (ref 19–32)
Calcium: 10.6 mg/dL — ABNORMAL HIGH (ref 8.4–10.5)
Chloride: 106 mEq/L (ref 96–112)
Creatinine, Ser: 0.92 mg/dL (ref 0.50–1.10)
GFR calc Af Amer: 75 mL/min — ABNORMAL LOW (ref >90)
GFR calc non Af Amer: 65 mL/min — ABNORMAL LOW (ref >90)
Glucose, Bld: 90 mg/dL (ref 70–99)
Potassium: 3.9 mEq/L (ref 3.7–5.3)
Sodium: 140 mEq/L (ref 137–147)
Total Bilirubin: 0.2 mg/dL — ABNORMAL LOW (ref 0.3–1.2)
Total Protein: 6.6 g/dL (ref 6.0–8.3)

## 2014-01-05 LAB — LACTATE DEHYDROGENASE: LDH: 173 U/L (ref 94–250)

## 2014-01-05 MED ORDER — DIAZEPAM 5 MG PO TABS: 2.5000 mg | ORAL_TABLET | Freq: Three times a day (TID) | ORAL | Status: DC | PRN

## 2014-01-05 MED ORDER — OXYCODONE HCL ER 20 MG PO T12A: EXTENDED_RELEASE_TABLET | ORAL | Status: DC

## 2014-01-05 MED ORDER — HYDROCODONE-ACETAMINOPHEN 10-325 MG PO TABS: ORAL_TABLET | ORAL | Status: DC

## 2014-01-05 NOTE — Progress Notes (Signed)
[facility]  OFFICE PROGRESS NOTE  [name], MD [place] [address]  DIAGNOSIS: Plasmacytosis - Plan: Multiple myeloma panel, serum, Kappa/lambda light chains  Epidural abscess, L2-L5  Chief Complaint  Patient presents with  . Plasmacytosis    CURRENT THERAPY: Status post kyphoplasty at which time plasmacytosis was found in the lumbar spine, status post external beam radiotherapy to the lumbar spine treatment ending in July 2015, status post decompression laminectomy on 09/22/2013 with MRSA epidural abscess; vancomycin every 12 hours through a right upper extremity PICC line followed by doxycycline 100 mg twice a day while continuing on physical therapy with last neurosurgical followup scheduled for 12/25/2013  INTERVAL HISTORY: Autumn Duran 63 y.o. female returns for followup a plasmacytoma involving L3 found at the time of vertebroplasty with final workup consistent with smoldering myeloma with kappa/lambda ratio 0.84 last measured on 08/19/2013 with negative bone survey and no evidence of renal impairment or hypercalcemia. Following peeling of postoperative radiotherapy ending in July 2015, the patient developed worsening back pain and was found to have an epidural abscess and underwent decompression laminectomy on 09/22/2013 with MRSA, treated with vancomycin every 12 hours through a right upper extremity PICC line initially, having completed doxycycline 100 mg twice a day last seen by neurosurgery on 11/06/2013 with followup appointment scheduled for 12/25/2013. Patient developed fever and swelling of the left lower extremity. Workup for DVT was negative. Repeat MRI still shows evidence of infection so doxycycline was reinstituted. Patient is awake with right hip pain intensive a back to work a week from today. She denies a nausea, vomiting, diarrhea, constipation, urinary hesitancy, with resolution of previously noted left lower 70  swelling. She awakens with right hip pain in the morning. She denies any cough, wheezing, skin rash, headache, or seizures.  MEDICAL HISTORY: Past Medical History  Diagnosis Date  . Diverticulosis   . Hemorrhoids   . Hx of adenomatous colonic polyps   . Hiatal hernia   . Hypertension   . Hyperlipidemia   . GERD (gastroesophageal reflux disease)   . Chronic kidney disease     kidney stone s/p stent placement ( removed)  . Complication of anesthesia   . PONV (postoperative nausea and vomiting)   . Bronchitis, chronic   . Chronic back pain   . Lumbar radicular pain   . Compression fracture of L3 lumbar vertebra   . T7 vertebral fracture   . Smoldering myeloma     INTERIM HISTORY: has Benign neoplasm of colon; Esophageal reflux; Other dysphagia; Lumbar compression fracture; Compression fracture; Hypercalcemia; Hypokalemia; Sepsis; Smoldering myeloma; Epidural abscess, L2-L5; and Right hip pain on her problem list.    ALLERGIES:  is allergic to morphine and related and tetracyclines & related.  MEDICATIONS: has a current medication list which includes the following prescription(s): diazepam, doxycycline, feeding supplement, hydrocodone-acetaminophen, ibuprofen, omeprazole, oxycodone, guaifenesin, and senna-docusate.  SURGICAL HISTORY:  Past Surgical History  Procedure Laterality Date  . Cholecystectomy    . Esophagogastroduodenoscopy  12/18/2011    Procedure: ESOPHAGOGASTRODUODENOSCOPY (EGD);  Surgeon: Lafayette Dragon, MD;  Location: [facility];  Service: Endoscopy;  Laterality: N/A;  . Colonoscopy  12/18/2011    Procedure: COLONOSCOPY;  Surgeon: Lafayette Dragon, MD;  Location: [facility];  Service: Endoscopy;  Laterality: N/A;  . Ureteral stent placement    . Vertebroplasty N/A 07/14/2013    Procedure: VERTEBROPLASTY WITH LUMBAR THREE BIOPSY;  Surgeon: Charlie Pitter, MD;  Location: [facility];  Service: Neurosurgery;  Laterality: N/A;  VERTEBROPLASTY WITH LUMBAR THREE BIOPSY  .  Back surgery    . Lumbar laminectomy for epidural abscess Left 09/22/2013    Procedure: LUMBAR LAMINECTOMY FOR EPIDURAL ABSCESS LEFT LUMBAR FIVE-SACRAL ONE;  Surgeon: Charlie Pitter, MD;  Location: [facility];  Service: Neurosurgery;  Laterality: Left;    FAMILY HISTORY: family history includes Breast cancer (age of onset: 36) in her sister; Breast cancer (age of onset: 39) in her other.  SOCIAL HISTORY:  reports that she has never smoked. She has never used smokeless tobacco. She reports that she drinks alcohol. She reports that she does not use illicit drugs.  REVIEW OF SYSTEMS:  Other than that discussed above is noncontributory.  PHYSICAL EXAMINATION: ECOG PERFORMANCE STATUS: 1 - Symptomatic but completely ambulatory  Blood pressure 107/59, pulse 81, temperature 97.8 F (36.6 C), temperature source Oral, resp. rate 16, weight 171 lb 14.4 oz (77.973 kg), SpO2 96 %.  GENERAL:alert, no distress and comfortable SKIN: skin color, texture, turgor are normal, no rashes or significant lesions EYES: PERLA; Conjunctiva are pink and non-injected, sclera clear SINUSES: No redness or tenderness over maxillary or ethmoid sinuses OROPHARYNX:no exudate, no erythema on lips, buccal mucosa, or tongue. NECK: supple, thyroid normal size, non-tender, without nodularity. No masses CHEST: normal AP diameter with no breast masses. LYMPH:  no palpable lymphadenopathy in the cervical, axillary or inguinal LUNGS: clear to auscultation and percussion with normal breathing effort HEART: regular rate & rhythm and no murmurs. ABDOMEN:abdomen soft, non-tender and normal bowel sounds MUSCULOSKELETAL:no cyanosis of digits and no clubbing. Tenderness over the lumbar spine. Decreased range of motion right hip. Homans sign. NEURO: alert & oriented x 3 with fluent speech, no focal motor/sensory deficitsa negative straight leg raising bilaterally.   LABORATORY DATA: Lab on 01/05/2014  Component Date Value Ref Range  Status  . WBC 01/05/2014 6.1  4.0 - 10.5 K/uL Final  . RBC 01/05/2014 4.29  3.87 - 5.11 MIL/uL Final  . Hemoglobin 01/05/2014 10.5* 12.0 - 15.0 g/dL Final  . HCT 01/05/2014 33.9* 36.0 - 46.0 % Final  . MCV 01/05/2014 79.0  78.0 - 100.0 fL Final  . MCH 01/05/2014 24.5* 26.0 - 34.0 pg Final  . MCHC 01/05/2014 31.0  30.0 - 36.0 g/dL Final  . RDW 01/05/2014 14.9  11.5 - 15.5 % Final  . Platelets 01/05/2014 493* 150 - 400 K/uL Final  . Neutrophils Relative % 01/05/2014 56  43 - 77 % Final  . Neutro Abs 01/05/2014 3.4  1.7 - 7.7 K/uL Final  . Lymphocytes Relative 01/05/2014 31  12 - 46 % Final  . Lymphs Abs 01/05/2014 1.9  0.7 - 4.0 K/uL Final  . Monocytes Relative 01/05/2014 9  3 - 12 % Final  . Monocytes Absolute 01/05/2014 0.5  0.1 - 1.0 K/uL Final  . Eosinophils Relative 01/05/2014 3  0 - 5 % Final  . Eosinophils Absolute 01/05/2014 0.2  0.0 - 0.7 K/uL Final  . Basophils Relative 01/05/2014 1  0 - 1 % Final  . Basophils Absolute 01/05/2014 0.1  0.0 - 0.1 K/uL Final  . Sodium 01/05/2014 140  137 - 147 mEq/L Final  . Potassium 01/05/2014 3.9  3.7 - 5.3 mEq/L Final  . Chloride 01/05/2014 106  96 - 112 mEq/L Final  . CO2 01/05/2014 25  19 - 32 mEq/L Final  . Glucose, Bld 01/05/2014 90  70 - 99 mg/dL Final  . BUN 01/05/2014  22  6 - 23 mg/dL Final  . Creatinine, Ser 01/05/2014 0.92  0.50 - 1.10 mg/dL Final  . Calcium 01/05/2014 10.6* 8.4 - 10.5 mg/dL Final  . Total Protein 01/05/2014 6.6  6.0 - 8.3 g/dL Final  . Albumin 01/05/2014 3.1* 3.5 - 5.2 g/dL Final  . AST 01/05/2014 24  0 - 37 U/L Final  . ALT 01/05/2014 13  0 - 35 U/L Final  . Alkaline Phosphatase 01/05/2014 69  39 - 117 U/L Final  . Total Bilirubin 01/05/2014 0.2* 0.3 - 1.2 mg/dL Final  . GFR calc non Af Amer 01/05/2014 65* >90 mL/min Final  . GFR calc Af Amer 01/05/2014 75* >90 mL/min Final   Comment: (NOTE) The eGFR has been calculated using the CKD EPI equation. This calculation has not been validated in all clinical  situations. eGFR's persistently <90 mL/min signify possible Chronic Kidney Disease.   . Anion gap 01/05/2014 9  5 - 15 Final  . LDH 01/05/2014 173  94 - 250 U/L Final  . Total Protein 01/05/2014 PENDING  6.0 - 8.3 g/dL Incomplete  . Albumin ELP 01/05/2014 PENDING  55.8 - 66.1 % Incomplete  . Alpha-1-Globulin 01/05/2014 PENDING  2.9 - 4.9 % Incomplete  . Alpha-2-Globulin 01/05/2014 PENDING  7.1 - 11.8 % Incomplete  . Beta Globulin 01/05/2014 PENDING  4.7 - 7.2 % Incomplete  . Beta 2 01/05/2014 PENDING  3.2 - 6.5 % Incomplete  . Gamma Globulin 01/05/2014 PENDING  11.1 - 18.8 % Incomplete  . M-Spike, % 01/05/2014 PENDING   Incomplete  . SPE Interp. 01/05/2014 PENDING   Incomplete  . Comment 01/05/2014 PENDING   Incomplete  . IgG (Immunoglobin G), Serum 01/05/2014 PENDING  694 - 1618 mg/dL Incomplete  . IgA 01/05/2014 PENDING  68 - 378 mg/dL Incomplete  . IgM, Serum 01/05/2014 PENDING  60 - 263 mg/dL Incomplete  . Immunofix Electr Int 01/05/2014 PENDING   Incomplete  Office Visit on 12/23/2013  Component Date Value Ref Range Status  . Organism ID, Bacteria 12/23/2013 NO GROWTH 5 DAYS   Final  . Organism ID, Bacteria 12/23/2013 NO GROWTH 5 DAYS   Final  . WBC 12/23/2013 6.0  4.0 - 10.5 K/uL Final  . RBC 12/23/2013 3.90  3.87 - 5.11 MIL/uL Final  . Hemoglobin 12/23/2013 9.6* 12.0 - 15.0 g/dL Final  . HCT 12/23/2013 30.2* 36.0 - 46.0 % Final  . MCV 12/23/2013 77.4* 78.0 - 100.0 fL Final  . MCH 12/23/2013 24.6* 26.0 - 34.0 pg Final  . MCHC 12/23/2013 31.8  30.0 - 36.0 g/dL Final  . RDW 12/23/2013 16.2* 11.5 - 15.5 % Final  . Platelets 12/23/2013 537* 150 - 400 K/uL Final  . CRP 12/23/2013 14.6* <0.60 mg/dL Final  . Sed Rate 12/23/2013 125* 0 - 22 mm/hr Final  Lab on 12/08/2013  Component Date Value Ref Range Status  . WBC 12/08/2013 8.9  4.0 - 10.5 K/uL Final  . RBC 12/08/2013 4.60  3.87 - 5.11 MIL/uL Final  . Hemoglobin 12/08/2013 11.8* 12.0 - 15.0 g/dL Final  . HCT 12/08/2013 37.3   36.0 - 46.0 % Final  . MCV 12/08/2013 81.1  78.0 - 100.0 fL Final  . MCH 12/08/2013 25.7* 26.0 - 34.0 pg Final  . MCHC 12/08/2013 31.6  30.0 - 36.0 g/dL Final  . RDW 12/08/2013 15.7* 11.5 - 15.5 % Final  . Platelets 12/08/2013 328  150 - 400 K/uL Final  . Neutrophils Relative % 12/08/2013 74  43 - 77 %  Final  . Neutro Abs 12/08/2013 6.5  1.7 - 7.7 K/uL Final  . Lymphocytes Relative 12/08/2013 19  12 - 46 % Final  . Lymphs Abs 12/08/2013 1.7  0.7 - 4.0 K/uL Final  . Monocytes Relative 12/08/2013 5  3 - 12 % Final  . Monocytes Absolute 12/08/2013 0.5  0.1 - 1.0 K/uL Final  . Eosinophils Relative 12/08/2013 2  0 - 5 % Final  . Eosinophils Absolute 12/08/2013 0.2  0.0 - 0.7 K/uL Final  . Basophils Relative 12/08/2013 1  0 - 1 % Final  . Basophils Absolute 12/08/2013 0.0  0.0 - 0.1 K/uL Final  . Sodium 12/08/2013 140  137 - 147 mEq/L Final  . Potassium 12/08/2013 4.0  3.7 - 5.3 mEq/L Final  . Chloride 12/08/2013 104  96 - 112 mEq/L Final  . CO2 12/08/2013 26  19 - 32 mEq/L Final  . Glucose, Bld 12/08/2013 112* 70 - 99 mg/dL Final  . BUN 12/08/2013 23  6 - 23 mg/dL Final  . Creatinine, Ser 12/08/2013 0.81  0.50 - 1.10 mg/dL Final  . Calcium 12/08/2013 10.4  8.4 - 10.5 mg/dL Final  . Total Protein 12/08/2013 6.7  6.0 - 8.3 g/dL Final  . Albumin 12/08/2013 3.2* 3.5 - 5.2 g/dL Final  . AST 12/08/2013 15  0 - 37 U/L Final  . ALT 12/08/2013 13  0 - 35 U/L Final  . Alkaline Phosphatase 12/08/2013 58  39 - 117 U/L Final  . Total Bilirubin 12/08/2013 0.2* 0.3 - 1.2 mg/dL Final  . GFR calc non Af Amer 12/08/2013 76* >90 mL/min Final  . GFR calc Af Amer 12/08/2013 88* >90 mL/min Final   Comment: (NOTE)                          The eGFR has been calculated using the CKD EPI equation.                          This calculation has not been validated in all clinical situations.                          eGFR's persistently <90 mL/min signify possible Chronic Kidney                          Disease.    . Anion gap 12/08/2013 10  5 - 15 Final  . Total Protein 12/08/2013 6.0  6.0 - 8.3 g/dL Final  . Albumin ELP 12/08/2013 55.2* 55.8 - 66.1 % Final  . Alpha-1-Globulin 12/08/2013 8.5* 2.9 - 4.9 % Final  . Alpha-2-Globulin 12/08/2013 11.9* 7.1 - 11.8 % Final  . Beta Globulin 12/08/2013 8.2* 4.7 - 7.2 % Final  . Beta 2 12/08/2013 3.8  3.2 - 6.5 % Final  . Gamma Globulin 12/08/2013 12.4  11.1 - 18.8 % Final  . M-Spike, % 12/08/2013 NOT DETECTED   Final  . SPE Interp. 12/08/2013 (NOTE)   Final   Comment: The possibility of a faint restricted band(s) cannot be completely                          excluded in the gamma region.                          Results  are consistent with SPE performed on 11/10/2013   . Comment 12/08/2013 (NOTE)   Final   Comment: ---------------                          Serum protein electrophoresis is a useful screening procedure in the                          detection of various pathophysiologic states such as inflammation,                          gammopathies, protein loss and other dysproteinemias.  Immunofixation                          electrophoresis (IFE) is a more sensitive technique for the                          identification of M-proteins found in patients with monoclonal                          gammopathy of unknown significance (MGUS), amyloidosis, early or                          treated myeloma or macroglobulinemia, solitary plasmacytoma or                          extramedullary plasmacytoma.  . IgG (Immunoglobin G), Serum 12/08/2013 819  690 - 1700 mg/dL Final  . IgA 12/08/2013 181  69 - 380 mg/dL Final  . IgM, Serum 12/08/2013 121  52 - 322 mg/dL Final  . Immunofix Electr Int 12/08/2013 (NOTE)   Final   Comment: Area of slightly restricted mobility in the IgG and Lambda lanes.                          Suggest repeat in 6-8 months, if clinically indicated.                          Reviewed by Odis Hollingshead, MD, PhD, FCAP (Electronic  Signature on                          File)                          Performed at [facility]  . Kappa free light chain 12/08/2013 1.69  0.33 - 1.94 mg/dL Final  . Lamda free light chains 12/08/2013 2.31  0.57 - 2.63 mg/dL Final  . Kappa, lamda light chain ratio 12/08/2013 0.73  0.26 - 1.65 Final   Performed at [facility]    PATHOLOGY:plasmacytosis on kyphoplasty specimen.  Urinalysis    Component Value Date/Time   COLORURINE YELLOW 09/20/2013 2334   APPEARANCEUR CLOUDY* 09/20/2013 2334   LABSPEC 1.017 09/20/2013 2334   PHURINE 6.0 09/20/2013 2334   GLUCOSEU NEGATIVE 09/20/2013 2334   HGBUR NEGATIVE 09/20/2013 2334   BILIRUBINUR NEGATIVE 09/20/2013 2334   KETONESUR NEGATIVE 09/20/2013 2334   PROTEINUR NEGATIVE 09/20/2013 2334   UROBILINOGEN 0.2 09/20/2013 2334   NITRITE NEGATIVE 09/20/2013 2334   LEUKOCYTESUR  NEGATIVE 09/20/2013 2334    RADIOGRAPHIC STUDIES: Mr Lumbar Spine W Wo Contrast  12/25/2013   CLINICAL DATA:  MRSA Diskitis and osteomyelitis, treated with antibiotics, recently completed, now with increasing low back pain, RIGHT hip pain, along with fever and chills. Previously the patient underwent L3 vertebral augmentation for myeloma.  EXAM: MRI LUMBAR SPINE WITHOUT AND WITH CONTRAST  TECHNIQUE: Multiplanar and multiecho pulse sequences of the lumbar spine were obtained without and with intravenous contrast.  CONTRAST:  73m MULTIHANCE GADOBENATE DIMEGLUMINE 529 MG/ML IV SOLN  COMPARISON:  Most recent MR 09/21/2013.  FINDINGS: Interval stability to slight improvement of bone marrow edema, discal hyperintensity, and enhancement related to presumed L2-3 and L3-4 diskitis and L2, L3, and L4 osteomyelitis. There is slight improvement in the degree of osteomyelitis at L4. There remains edema and enhancement throughout the entire L3 vertebral body, as well as the inferior 1/2 of the L2 vertebral body. This enhancement involves the pedicle and superior articular  process at L3 on the RIGHT similar to priors, as well as the RIGHT psoas muscle although no paravertebral or psoas abscess is seen. Mild enhancement within the ventral epidural space opposite the L2-3 disc space is minimally more prominent (image 8 series 8, image 17 series 9) but is noncompressive. Hypointense region within L3 vertebral body represents methylmethacrylate.  There is interval stability of changes of septic arthritis involving the LEFT L4-5 facet joint. Enhancement of the superior articular process of L5 and inferior articular process of L4 appears fairly similar. Slight increase in enhancement of the dorsal paraspinous soft tissues likely relates to the previous surgical procedure. The epidural abscess at L5 on the LEFT has been successfully treated, and is no longer seen. There is no residual compression in the spinal canal, but there is enhancement surrounding the LEFT L5 nerve root of a nonspecific nature. No drainable fluid collection. 5 mm anterolisthesis L5 on S1 is stable.  No new areas of infection are seen.  IMPRESSION: Improved/resolved appearance status post LEFT L5-L1 hemilaminectomy and resection of LEFT L5 epidural abscess.  Minimal ventral epidural enhancement at L2-L3 is slightly more prominent than priors, but noncompressive, and nonsurgical and nature.  Persistent enhancement of the lumbar vertebrae in the region of the L2-3 and L3-4 disc spaces with slight improvement with regard to the edema enhancement of the superior endplate of L4, basically unchanged appearance of L2 and L3.  Essentially unchanged appearance of septic arthritis at L4-5 on the LEFT. Slight enhancement of the paravertebral soft tissues is likely related to recent surgery.  No new areas of osteomyelitis or suggestion of significant progression of disease.   Electronically Signed   By: JRolla FlattenM.D.   On: 12/25/2013 19:24   UKoreaVenous Img Lower Bilateral  12/18/2013   CLINICAL DATA:  Leg swelling  EXAM:  BILATERAL LOWER EXTREMITY VENOUS DOPPLER ULTRASOUND  TECHNIQUE: Gray-scale sonography with graded compression, as well as color Doppler and duplex ultrasound were performed to evaluate the lower extremity deep venous systems from the level of the common femoral vein and including the common femoral, femoral, profunda femoral, popliteal and calf veins including the posterior tibial, peroneal and gastrocnemius veins when visible. The superficial great saphenous vein was also interrogated. Spectral Doppler was utilized to evaluate flow at rest and with distal augmentation maneuvers in the common femoral, femoral and popliteal veins.  COMPARISON:  None.  FINDINGS: RIGHT LOWER EXTREMITY  Common Femoral Vein: No evidence of thrombus. Normal compressibility, respiratory phasicity and response to augmentation.  Saphenofemoral Junction: No evidence of thrombus. Normal compressibility and flow on color Doppler imaging.  Profunda Femoral Vein: No evidence of thrombus. Normal compressibility and flow on color Doppler imaging.  Femoral Vein: No evidence of thrombus. Normal compressibility, respiratory phasicity and response to augmentation.  Popliteal Vein: No evidence of thrombus. Normal compressibility, respiratory phasicity and response to augmentation.  Calf Veins: No evidence of thrombus. Normal compressibility and flow on color Doppler imaging.  Superficial Great Saphenous Vein: No evidence of thrombus. Normal compressibility and flow on color Doppler imaging.  Venous Reflux:  None.  Other Findings:  None.  LEFT LOWER EXTREMITY  Common Femoral Vein: No evidence of thrombus. Normal compressibility, respiratory phasicity and response to augmentation.  Saphenofemoral Junction: No evidence of thrombus. Normal compressibility and flow on color Doppler imaging.  Profunda Femoral Vein: No evidence of thrombus. Normal compressibility and flow on color Doppler imaging.  Femoral Vein: No evidence of thrombus. Normal compressibility,  respiratory phasicity and response to augmentation.  Popliteal Vein: No evidence of thrombus. Normal compressibility, respiratory phasicity and response to augmentation.  Calf Veins: Not well visualized.  Superficial Great Saphenous Vein: No evidence of thrombus. Normal compressibility and flow on color Doppler imaging.  Venous Reflux:  None.  Other Findings:  None.  IMPRESSION: No evidence of deep venous thrombosis.   Electronically Signed   By: Kathreen Devoid   On: 12/18/2013 13:37    ASSESSMENT:  #1. Persistent osteomyelitis lumbar spine, currently on doxycycline. #2. Inadequate pain control. #3. Smoldering myeloma, awaiting today's additional lab tests. #4. Status post lumbar laminectomy for MRSA epidural abscess, status post intravenous vancomycin, now restarted on doxycycline because of fever and persistent findings on MRI suggesting infection. #5. Hypertension, controlled. #6. Anemia of chronic disease.   PLAN:  #1. Continue doxycycline 100 mg twice a day. #2. Increase OxyContin to 20 g in the morning and 40 mg at bedtime. Continue hydrocodone for breakthrough pain. #3. Continue Valium. #4. Return to work on 01/12/2014 with follow-up here in 4 weeks at which time CBC, chem profile, LDH, beta-2 microglobulin, myeloma panel, and light chains will be done.   All questions were answered. The patient knows to call the clinic with any problems, questions or concerns. We can certainly see the patient much sooner if necessary.   I spent 30 minutes counseling the patient face to face. The total time spent in the appointment was 40 minutes.    Doroteo Bradford, MD 01/05/2014 10:54 AM  DISCLAIMER:  This note was dictated with voice recognition software.  Similar sounding words can inadvertently be transcribed inaccurately and may not be corrected upon review.

## 2014-01-05 NOTE — Patient Instructions (Signed)
[facility] Discharge Instructions  RECOMMENDATIONS MADE BY THE CONSULTANT AND ANY TEST RESULTS WILL BE SENT TO YOUR REFERRING PHYSICIAN.  EXAM FINDINGS BY THE PHYSICIAN TODAY AND SIGNS OR SYMPTOMS TO REPORT TO CLINIC OR PRIMARY PHYSICIAN: Exam and findings as discussed by Dr. Barnet Glasgow.  MEDICATIONS PRESCRIBED:  Oxycontin will change to 40 mg at bedtime and 20 mg in the am Use Hydrocodone for the breakthrough pain Refill for Valium  INSTRUCTIONS/FOLLOW-UP: Follow-up in 1 month with labs and office visit.  Thank you for choosing [facility] to provide your oncology and hematology care.  To afford each patient quality time with our providers, please arrive at least 15 minutes before your scheduled appointment time.  With your help, our goal is to use those 15 minutes to complete the necessary work-up to ensure our physicians have the information they need to help with your evaluation and healthcare recommendations.    Effective January 1st, 2014, we ask that you re-schedule your appointment with our physicians should you arrive 10 or more minutes late for your appointment.  We strive to give you quality time with our providers, and arriving late affects you and other patients whose appointments are after yours.    Again, thank you for choosing [facility].  Our hope is that these requests will decrease the amount of time that you wait before being seen by our physicians.       _____________________________________________________________  Should you have questions after your visit to [facility], please contact our office at [phone] between the hours of 8:30 a.m. and 4:30 p.m.  Voicemails left after 4:30 p.m. will not be returned until the following business day.  For prescription refill requests, have your pharmacy contact our office with your prescription refill request.     _______________________________________________________________  We hope that we have given you very good care.  You may receive a patient satisfaction survey in the mail, please complete it and return it as soon as possible.  We value your feedback!  _______________________________________________________________  Have you asked about our STAR program?  STAR stands for Survivorship Training and Rehabilitation, and this is a nationally recognized cancer care program that focuses on survivorship and rehabilitation.  Cancer and cancer treatments may cause problems, such as, pain, making you feel tired and keeping you from doing the things that you need or want to do. Cancer rehabilitation can help. Our goal is to reduce these troubling effects and help you have the best quality of life possible.  You may receive a survey from a nurse that asks questions about your current state of health.  Based on the survey results, all eligible patients will be referred to the [facility] program for an evaluation so we can better serve you!  A frequently asked questions sheet is available upon request.

## 2014-01-05 NOTE — Addendum Note (Signed)
Addended by: Mellissa Kohut on: 01/05/2014 05:15 PM   Modules accepted: Orders

## 2014-01-05 NOTE — Progress Notes (Signed)
LABS FOR CBCD,CMP,LDH,B2M,KLLC,MM

## 2014-01-06 ENCOUNTER — Ambulatory Visit (INDEPENDENT_AMBULATORY_CARE_PROVIDER_SITE_OTHER): Payer: BC Managed Care – PPO | Admitting: Internal Medicine

## 2014-01-06 ENCOUNTER — Encounter: Payer: Self-pay | Admitting: Internal Medicine

## 2014-01-06 VITALS — BP 120/72 | HR 80 | Temp 98.1°F | Wt 174.8 lb

## 2014-01-06 DIAGNOSIS — G061 Intraspinal abscess and granuloma: Secondary | ICD-10-CM | POA: Diagnosis not present

## 2014-01-06 DIAGNOSIS — M25551 Pain in right hip: Secondary | ICD-10-CM

## 2014-01-06 LAB — KAPPA/LAMBDA LIGHT CHAINS
Kappa free light chain: 1.88 mg/dL (ref 0.33–1.94)
Kappa, lambda light chain ratio: 0.67 (ref 0.26–1.65)
Lambda free light chains: 2.82 mg/dL — ABNORMAL HIGH (ref 0.57–2.63)

## 2014-01-06 NOTE — Progress Notes (Signed)
Patient ID: Autumn Duran, female   DOB: 1951/02/25, 63 y.o.   MRN: [id number]         [facility] for Infectious Disease  Patient Active Problem List   Diagnosis Date Noted  . Right hip pain 12/23/2013  . Epidural abscess, L2-L5 11/04/2013  . Smoldering myeloma 09/22/2013  . Sepsis 09/21/2013  . Hypercalcemia 08/20/2013  . Hypokalemia 08/20/2013  . Lumbar compression fracture 07/12/2013  . Compression fracture 07/12/2013  . Benign neoplasm of colon 12/18/2011  . Esophageal reflux 12/18/2011  . Other dysphagia 12/18/2011    Patient's Medications  New Prescriptions   No medications on file  Previous Medications   DIAZEPAM (VALIUM) 5 MG TABLET    Take 0.5 tablets (2.5 mg total) by mouth every 8 (eight) hours as needed for anxiety.   DOXYCYCLINE (VIBRAMYCIN) 100 MG CAPSULE    Take 1 capsule (100 mg total) by mouth 2 (two) times daily.   FEEDING SUPPLEMENT (BOOST HIGH PROTEIN) LIQD    Take 1 Container by mouth 3 (three) times daily between meals.   GUAIFENESIN (MUCINEX) 600 MG 12 HR TABLET    Take 600 mg by mouth daily as needed.   HYDROCODONE-ACETAMINOPHEN (NORCO) 10-325 MG PER TABLET    Take 1 or 2 tablets every 4 hours to control pain   IBUPROFEN (ADVIL) 200 MG TABLET    Take 200 mg by mouth every 6 (six) hours as needed. Takes 2 when needed   OMEPRAZOLE (PRILOSEC) 40 MG CAPSULE    Take 40 mg by mouth daily as needed (acid reflux).    OXYCODONE (OXYCONTIN) 20 MG T12A 12 HR TABLET    Take 20 mg in the morning and 40 mg at bedtime daily.   SENNA-DOCUSATE (SENOKOT-S) 8.6-50 MG PER TABLET    Take 2 tablets by mouth daily.  Modified Medications   No medications on file  Discontinued Medications   No medications on file    Subjective: Autumn Duran is in for her routine visit. She has now been back on doxycycline for 2 weeks after developing worsening right hip pain when she stopped antibiotic therapy for her MRSA lumbar infection. She is feeling better but is having some problems  with nausea related to her doxycycline. She is having less pain and has been able to be more active in the past week. Review of Systems: Pertinent items are noted in HPI.  Past Medical History  Diagnosis Date  . Diverticulosis   . Hemorrhoids   . Hx of adenomatous colonic polyps   . Hiatal hernia   . Hypertension   . Hyperlipidemia   . GERD (gastroesophageal reflux disease)   . Chronic kidney disease     kidney stone s/p stent placement ( removed)  . Complication of anesthesia   . PONV (postoperative nausea and vomiting)   . Bronchitis, chronic   . Chronic back pain   . Lumbar radicular pain   . Compression fracture of L3 lumbar vertebra   . T7 vertebral fracture   . Smoldering myeloma     History  Substance Use Topics  . Smoking status: Never Smoker   . Smokeless tobacco: Never Used  . Alcohol Use: Yes     Comment: social occ    Family History  Problem Relation Age of Onset  . Breast cancer Sister 43  . Breast cancer Other 25    aunt    Allergies  Allergen Reactions  . Morphine And Related Other (See Comments)    Headache  .  Tetracyclines & Related Nausea And Vomiting    Objective: Temp: 98.1 F (36.7 C) (11/10 1535) Temp Source: Oral (11/10 1535) BP: 120/72 mmHg (11/10 1535) Pulse Rate: 80 (11/10 1535)  General: she is in good spirits. She walks with the aid of her cane.    SED RATE (mm/hr)  Date Value  12/23/2013 125*  09/21/2013 7   CRP (mg/dL)  Date Value  12/23/2013 14.6*  09/21/2013 20.9*      Assessment: Given the fact that her inflammatory markers are quite elevated, her pain worsened off of antibiotics and is now improving back on doxycycline, I must assume that her MRSA infection is not yet cured.  Plan: 1. Continue doxycycline for now 2. Follow-up in 6 weeks   Michel Bickers, MD [facility] for [place] [phone] pager   [phone] cell 01/06/2014, 4:45 PM

## 2014-01-07 LAB — BETA 2 MICROGLOBULIN, SERUM: Beta-2 Microglobulin: 3.25 mg/L — ABNORMAL HIGH (ref <=2.51)

## 2014-01-09 LAB — MULTIPLE MYELOMA PANEL, SERUM
Albumin ELP: 54.1 % — ABNORMAL LOW (ref 55.8–66.1)
Alpha-1-Globulin: 6.1 % — ABNORMAL HIGH (ref 2.9–4.9)
Alpha-2-Globulin: 11.7 % (ref 7.1–11.8)
Beta 2: 5 % (ref 3.2–6.5)
Beta Globulin: 7.5 % — ABNORMAL HIGH (ref 4.7–7.2)
Gamma Globulin: 15.6 % (ref 11.1–18.8)
IgA: 182 mg/dL (ref 69–380)
IgG (Immunoglobin G), Serum: 928 mg/dL (ref 690–1700)
IgM, Serum: 88 mg/dL (ref 52–322)
M-Spike, %: NOT DETECTED g/dL
Total Protein: 5.7 g/dL — ABNORMAL LOW (ref 6.0–8.3)

## 2014-01-21 ENCOUNTER — Ambulatory Visit: Payer: BC Managed Care – PPO | Admitting: Internal Medicine

## 2014-02-04 ENCOUNTER — Ambulatory Visit
Admission: RE | Admit: 2014-02-04 | Discharge: 2014-02-04 | Disposition: A | Discharge status: Home / Self Care | Payer: BC Managed Care – PPO | Source: Ambulatory Visit

## 2014-02-04 DIAGNOSIS — Z1239 Encounter for other screening for malignant neoplasm of breast: Secondary | ICD-10-CM

## 2014-02-05 ENCOUNTER — Encounter (HOSPITAL_COMMUNITY): Payer: Self-pay

## 2014-02-05 ENCOUNTER — Encounter (HOSPITAL_BASED_OUTPATIENT_CLINIC_OR_DEPARTMENT_OTHER): Payer: BC Managed Care – PPO

## 2014-02-05 ENCOUNTER — Encounter (HOSPITAL_COMMUNITY): Payer: BC Managed Care – PPO | Attending: Hematology and Oncology

## 2014-02-05 VITALS — BP 102/65 | HR 88 | Temp 98.0°F | Resp 16 | Wt 169.1 lb

## 2014-02-05 DIAGNOSIS — C9000 Multiple myeloma not having achieved remission: Secondary | ICD-10-CM

## 2014-02-05 DIAGNOSIS — D72822 Plasmacytosis: Secondary | ICD-10-CM

## 2014-02-05 DIAGNOSIS — M869 Osteomyelitis, unspecified: Secondary | ICD-10-CM

## 2014-02-05 DIAGNOSIS — G893 Neoplasm related pain (acute) (chronic): Secondary | ICD-10-CM

## 2014-02-05 DIAGNOSIS — D472 Monoclonal gammopathy: Secondary | ICD-10-CM

## 2014-02-05 DIAGNOSIS — G061 Intraspinal abscess and granuloma: Secondary | ICD-10-CM

## 2014-02-05 LAB — CBC WITH DIFFERENTIAL/PLATELET
Basophils Absolute: 0.1 10*3/uL (ref 0.0–0.1)
Basophils Relative: 1 % (ref 0–1)
Eosinophils Absolute: 0.2 10*3/uL (ref 0.0–0.7)
Eosinophils Relative: 3 % (ref 0–5)
HCT: 34.8 % — ABNORMAL LOW (ref 36.0–46.0)
Hemoglobin: 10.6 g/dL — ABNORMAL LOW (ref 12.0–15.0)
Lymphocytes Relative: 24 % (ref 12–46)
Lymphs Abs: 1.5 10*3/uL (ref 0.7–4.0)
MCH: 23.7 pg — ABNORMAL LOW (ref 26.0–34.0)
MCHC: 30.5 g/dL (ref 30.0–36.0)
MCV: 77.7 fL — ABNORMAL LOW (ref 78.0–100.0)
Monocytes Absolute: 0.3 10*3/uL (ref 0.1–1.0)
Monocytes Relative: 5 % (ref 3–12)
Neutro Abs: 4.2 10*3/uL (ref 1.7–7.7)
Neutrophils Relative %: 67 % (ref 43–77)
Platelets: 395 10*3/uL (ref 150–400)
RBC: 4.48 MIL/uL (ref 3.87–5.11)
RDW: 15.4 % (ref 11.5–15.5)
WBC: 6.2 10*3/uL (ref 4.0–10.5)

## 2014-02-05 LAB — COMPREHENSIVE METABOLIC PANEL
ALT: 11 U/L (ref 0–35)
AST: 29 U/L (ref 0–37)
Albumin: 3.5 g/dL (ref 3.5–5.2)
Alkaline Phosphatase: 63 U/L (ref 39–117)
Anion gap: 11 (ref 5–15)
BUN: 13 mg/dL (ref 6–23)
CO2: 28 mEq/L (ref 19–32)
Calcium: 10.9 mg/dL — ABNORMAL HIGH (ref 8.4–10.5)
Chloride: 103 mEq/L (ref 96–112)
Creatinine, Ser: 0.77 mg/dL (ref 0.50–1.10)
GFR calc Af Amer: >90 mL/min (ref >90)
GFR calc non Af Amer: 88 mL/min — ABNORMAL LOW (ref >90)
Glucose, Bld: 113 mg/dL — ABNORMAL HIGH (ref 70–99)
Potassium: 3.9 mEq/L (ref 3.7–5.3)
Sodium: 142 mEq/L (ref 137–147)
Total Bilirubin: 0.4 mg/dL (ref 0.3–1.2)
Total Protein: 7 g/dL (ref 6.0–8.3)

## 2014-02-05 MED ORDER — DIAZEPAM 5 MG PO TABS: 2.5000 mg | ORAL_TABLET | Freq: Three times a day (TID) | ORAL | Status: DC | PRN

## 2014-02-05 MED ORDER — HYDROCODONE-ACETAMINOPHEN 10-325 MG PO TABS: ORAL_TABLET | ORAL | Status: DC

## 2014-02-05 NOTE — Patient Instructions (Signed)
[facility] Discharge Instructions  RECOMMENDATIONS MADE BY THE CONSULTANT AND ANY TEST RESULTS WILL BE SENT TO YOUR REFERRING PHYSICIAN.  EXAM FINDINGS BY THE PHYSICIAN TODAY AND SIGNS OR SYMPTOMS TO REPORT TO CLINIC OR PRIMARY PHYSICIAN: Exam and findings as discussed by Dr. Barnet Glasgow.  If there are any concerns with your labs we will contact you.  MEDICATIONS PRESCRIBED:  Hydrocodone - Refill Valium - Refill  INSTRUCTIONS/FOLLOW-UP: Follow-up in 6 weeks.  Thank you for choosing [facility] to provide your oncology and hematology care.  To afford each patient quality time with our providers, please arrive at least 15 minutes before your scheduled appointment time.  With your help, our goal is to use those 15 minutes to complete the necessary work-up to ensure our physicians have the information they need to help with your evaluation and healthcare recommendations.    Effective January 1st, 2014, we ask that you re-schedule your appointment with our physicians should you arrive 10 or more minutes late for your appointment.  We strive to give you quality time with our providers, and arriving late affects you and other patients whose appointments are after yours.    Again, thank you for choosing [facility].  Our hope is that these requests will decrease the amount of time that you wait before being seen by our physicians.       _____________________________________________________________  Should you have questions after your visit to [facility], please contact our office at [phone] between the hours of 8:30 a.m. and 4:30 p.m.  Voicemails left after 4:30 p.m. will not be returned until the following business day.  For prescription refill requests, have your pharmacy contact our office with your prescription refill request.    _______________________________________________________________  We hope that we have given you very  good care.  You may receive a patient satisfaction survey in the mail, please complete it and return it as soon as possible.  We value your feedback!  _______________________________________________________________  Have you asked about our STAR program?  STAR stands for Survivorship Training and Rehabilitation, and this is a nationally recognized cancer care program that focuses on survivorship and rehabilitation.  Cancer and cancer treatments may cause problems, such as, pain, making you feel tired and keeping you from doing the things that you need or want to do. Cancer rehabilitation can help. Our goal is to reduce these troubling effects and help you have the best quality of life possible.  You may receive a survey from a nurse that asks questions about your current state of health.  Based on the survey results, all eligible patients will be referred to the [facility] program for an evaluation so we can better serve you!  A frequently asked questions sheet is available upon request.

## 2014-02-05 NOTE — Progress Notes (Signed)
LABS FOR B2M,KLLC,MM,CBCD,CMP

## 2014-02-05 NOTE — Progress Notes (Signed)
Autumn Duran  OFFICE PROGRESS NOTE  [name], MD [place] [id number]  DIAGNOSIS: Smoldering myeloma  Abscess in epidural space of L2-L5 lumbar spine  Chief Complaint  Patient presents with  . Plasmacytosis  . Smoldering myeloma    CURRENT THERAPY: Status post kyphoplasty at which time plasmacytosis was found in the lumbar spine, status post external beam radiotherapy to the lumbar spine treatment ending in July 2015, status post decompression laminectomy on 09/22/2013 with MRSA epidural abscess; vancomycin every 12 hours through a right upper extremity PICC line followed by doxycycline 100 mg twice a day while continuing on physical therapy. On 01/06/2014 she was seen by infectious disease who recommended continuing doxycycline therapy.  INTERVAL HISTORY: Autumn Duran 63 y.o. female returns forfollowup a plasmacytoma involving L3 found at the time of vertebroplasty with final workup consistent with smoldering myeloma with kappa/lambda ratio 0.84 last measured on 08/19/2013 with negative bone survey and no evidence of renal impairment or hypercalcemia. Following peeling of postoperative radiotherapy ending in July 2015, the patient developed worsening back pain and was found to have an epidural abscess and underwent decompression laminectomy on 09/22/2013 with MRSA, treated with vancomycin every 12 hours through a right upper extremity PICC line initially, having completed doxycycline 100 mg twice a day last seen by neurosurgery on 12/25/2013. Seen by infectious disease on 01/06/2014 recommended continuation of doxycycline therapy.   She continues on 3 hydrocodone 10 mg plus AP AP per day. She is working full-time. Appetite is good with no nausea, vomiting, fever, night sweats, but with occasional back discomfort. She did develop lower 70 swelling and was prescribed furosemide plus potassium supplements by her PCP. She denies any cough,  wheezing, sore throat, skin rash, headache, or seizures.   MEDICAL HISTORY: Past Medical History  Diagnosis Date  . Diverticulosis   . Hemorrhoids   . Hx of adenomatous colonic polyps   . Hiatal hernia   . Hypertension   . Hyperlipidemia   . GERD (gastroesophageal reflux disease)   . Chronic kidney disease     kidney stone s/p stent placement ( removed)  . Complication of anesthesia   . PONV (postoperative nausea and vomiting)   . Bronchitis, chronic   . Chronic back pain   . Lumbar radicular pain   . Compression fracture of L3 lumbar vertebra   . T7 vertebral fracture   . Smoldering myeloma     INTERIM HISTORY: has Benign neoplasm of colon; Esophageal reflux; Other dysphagia; Lumbar compression fracture; Compression fracture; Hypercalcemia; Hypokalemia; Sepsis; Smoldering myeloma; Epidural abscess, L2-L5; and Right hip pain on her problem list.    ALLERGIES:  is allergic to morphine and related and tetracyclines & related.  MEDICATIONS: has a current medication list which includes the following prescription(s): bisacodyl, diazepam, doxycycline, feeding supplement, furosemide, hydrocodone-acetaminophen, ibuprofen, omeprazole, potassium bicarb-citric acid, guaifenesin, oxycodone, and senna-docusate.  SURGICAL HISTORY:  Past Surgical History  Procedure Laterality Date  . Cholecystectomy    . Esophagogastroduodenoscopy  12/18/2011    Procedure: ESOPHAGOGASTRODUODENOSCOPY (EGD);  Surgeon: Lafayette Dragon, MD;  Location: [facility];  Service: Endoscopy;  Laterality: N/A;  . Colonoscopy  12/18/2011    Procedure: COLONOSCOPY;  Surgeon: Lafayette Dragon, MD;  Location: [facility];  Service: Endoscopy;  Laterality: N/A;  . Ureteral stent placement    . Vertebroplasty N/A 07/14/2013    Procedure: VERTEBROPLASTY WITH LUMBAR THREE BIOPSY;  Surgeon: Charlie Pitter, MD;  Location:  [facility];  Service: Neurosurgery;  Laterality: N/A;  VERTEBROPLASTY WITH LUMBAR THREE BIOPSY  . Back surgery     . Lumbar laminectomy for epidural abscess Left 09/22/2013    Procedure: LUMBAR LAMINECTOMY FOR EPIDURAL ABSCESS LEFT LUMBAR FIVE-SACRAL ONE;  Surgeon: Henry A Pool, MD;  Location: [facility];  Service: Neurosurgery;  Laterality: Left;    FAMILY HISTORY: family history includes Breast cancer (age of onset: 48) in her sister; Breast cancer (age of onset: 60) in her other.  SOCIAL HISTORY:  reports that she has never smoked. She has never used smokeless tobacco. She reports that she drinks alcohol. She reports that she does not use illicit drugs.  REVIEW OF SYSTEMS:  Other than that discussed above is noncontributory.  PHYSICAL EXAMINATION: ECOG PERFORMANCE STATUS: 1 - Symptomatic but completely ambulatory  Blood pressure 102/65, pulse 88, temperature 98 F (36.7 C), temperature source Oral, resp. rate 16, weight 169 lb 1.6 oz (76.703 kg), SpO2 95 %.  GENERAL:alert, no distress and comfortable SKIN: skin color, texture, turgor are normal, no rashes or significant lesions EYES: PERLA; Conjunctiva are pink and non-injected, sclera clear SINUSES: No redness or tenderness over maxillary or ethmoid sinuses OROPHARYNX:no exudate, no erythema on lips, buccal mucosa, or tongue. NECK: supple, thyroid normal size, non-tender, without nodularity. No masses CHEST: Normal AP diameter with no breast masses.LYMPH:  no palpable lymphadenopathy in the cervical, axillary or inguinal LUNGS: clear to auscultation and percussion with normal breathing effort HEART: regular rate & rhythm and no murmurs. ABDOMEN:abdomen soft, non-tender and normal bowel sounds MUSCULOSKELETAL:no cyanosis of digits and no clubbing. Range of motion normal.  minimal tenderness over the lumbar spine.  NEURO: alert & oriented x 3 with fluent speech, no focal motor/sensory deficits. Bilateral hyperreflexia both lower extremities.    LABORATORY DATA: Lab on 02/05/2014  Component Date Value Ref Range Status  . WBC 02/05/2014 6.2   4.0 - 10.5 K/uL Final  . RBC 02/05/2014 4.48  3.87 - 5.11 MIL/uL Final  . Hemoglobin 02/05/2014 10.6* 12.0 - 15.0 g/dL Final  . HCT 02/05/2014 34.8* 36.0 - 46.0 % Final  . MCV 02/05/2014 77.7* 78.0 - 100.0 fL Final  . MCH 02/05/2014 23.7* 26.0 - 34.0 pg Final  . MCHC 02/05/2014 30.5  30.0 - 36.0 g/dL Final  . RDW 02/05/2014 15.4  11.5 - 15.5 % Final  . Platelets 02/05/2014 395  150 - 400 K/uL Final  . Neutrophils Relative % 02/05/2014 67  43 - 77 % Final  . Neutro Abs 02/05/2014 4.2  1.7 - 7.7 K/uL Final  . Lymphocytes Relative 02/05/2014 24  12 - 46 % Final  . Lymphs Abs 02/05/2014 1.5  0.7 - 4.0 K/uL Final  . Monocytes Relative 02/05/2014 5  3 - 12 % Final  . Monocytes Absolute 02/05/2014 0.3  0.1 - 1.0 K/uL Final  . Eosinophils Relative 02/05/2014 3  0 - 5 % Final  . Eosinophils Absolute 02/05/2014 0.2  0.0 - 0.7 K/uL Final  . Basophils Relative 02/05/2014 1  0 - 1 % Final  . Basophils Absolute 02/05/2014 0.1  0.0 - 0.1 K/uL Final  . Sodium 02/05/2014 142  137 - 147 mEq/L Final  . Potassium 02/05/2014 3.9  3.7 - 5.3 mEq/L Final  . Chloride 02/05/2014 103  96 - 112 mEq/L Final  . CO2 02/05/2014 28  19 - 32 mEq/L Final  . Glucose, Bld 02/05/2014 113* 70 - 99 mg/dL Final  . BUN 02/05/2014 13  6 -   23 mg/dL Final  . Creatinine, Ser 02/05/2014 0.77  0.50 - 1.10 mg/dL Final  . Calcium 02/05/2014 10.9* 8.4 - 10.5 mg/dL Final  . Total Protein 02/05/2014 7.0  6.0 - 8.3 g/dL Final  . Albumin 02/05/2014 3.5  3.5 - 5.2 g/dL Final  . AST 02/05/2014 29  0 - 37 U/L Final  . ALT 02/05/2014 11  0 - 35 U/L Final  . Alkaline Phosphatase 02/05/2014 63  39 - 117 U/L Final  . Total Bilirubin 02/05/2014 0.4  0.3 - 1.2 mg/dL Final  . GFR calc non Af Amer 02/05/2014 88* >90 mL/min Final  . GFR calc Af Amer 02/05/2014 >90  >90 mL/min Final   Comment: (NOTE) The eGFR has been calculated using the CKD EPI equation. This calculation has not been validated in all clinical situations. eGFR's persistently  <90 mL/min signify possible Chronic Kidney Disease.   . Anion gap 02/05/2014 11  5 - 15 Final    PATHOLOGYno new pathology.  Urinalysis    Component Value Date/Time   COLORURINE YELLOW 09/20/2013 2334   APPEARANCEUR CLOUDY* 09/20/2013 2334   LABSPEC 1.017 09/20/2013 2334   PHURINE 6.0 09/20/2013 2334   GLUCOSEU NEGATIVE 09/20/2013 2334   HGBUR NEGATIVE 09/20/2013 2334   BILIRUBINUR NEGATIVE 09/20/2013 2334   KETONESUR NEGATIVE 09/20/2013 2334   PROTEINUR NEGATIVE 09/20/2013 2334   UROBILINOGEN 0.2 09/20/2013 2334   NITRITE NEGATIVE 09/20/2013 2334   LEUKOCYTESUR NEGATIVE 09/20/2013 2334    RADIOGRAPHIC STUDIES: Mm Screening Breast Tomo Bilateral  02/05/2014   CLINICAL DATA:  Screening.  EXAM: DIGITAL SCREENING BILATERAL MAMMOGRAM WITH 3D TOMO WITH CAD  COMPARISON:  Previous exam(s).  ACR Breast Density Category c: The breast tissue is heterogeneously dense, which may obscure small masses.  FINDINGS: There are no findings suspicious for malignancy. Images were processed with CAD.  IMPRESSION: No mammographic evidence of malignancy. A result letter of this screening mammogram will be mailed directly to the patient.  RECOMMENDATION: Screening mammogram in one year. (Code:[id number]-B-01Y)  BI-RADS CATEGORY  1: Negative.   Electronically Signed   By: Pamelia Hoit M.D.   On: 02/05/2014 07:37    ASSESSMENT:  #1. Persistent osteomyelitis lumbar spine, currently on doxycycline  #2. Inadequate pain control. #3. Smoldering myeloma, awaiting today's additional lab tests. #4. Status post lumbar laminectomy for MRSA epidural abscess, status post intravenous vancomycin, now restarted on doxycycline because of fever and persistent findings on MRI suggesting infection. #5. Hypertension, controlled. #6. Anemia of chronic disease  PLAN:  #1. Continue doxycycline 100 mg twice a day. #2.  Encouraged to use more hydrocodone in order to function properly. #3. Continue Valium. #4. Follow-up 6 weeks with  CBC, chem profile, myeloma panel, light chains.   All questions were answered. The patient knows to call the clinic with any problems, questions or concerns. We can certainly see the patient much sooner if necessary.   I spent 25 minutes counseling the patient face to face. The total time spent in the appointment was 30 minutes.    Doroteo Bradford, MD 02/05/2014 11:15 AM  DISCLAIMER:  This note was dictated with voice recognition software.  Similar sounding words can inadvertently be transcribed inaccurately and may not be corrected upon review.

## 2014-02-06 LAB — KAPPA/LAMBDA LIGHT CHAINS
Kappa free light chain: 2.35 mg/dL — ABNORMAL HIGH (ref 0.33–1.94)
Kappa, lambda light chain ratio: 0.81 (ref 0.26–1.65)
Lambda free light chains: 2.9 mg/dL — ABNORMAL HIGH (ref 0.57–2.63)

## 2014-02-09 LAB — BETA 2 MICROGLOBULIN, SERUM: Beta-2 Microglobulin: 4.01 mg/L — ABNORMAL HIGH (ref <=2.51)

## 2014-02-10 ENCOUNTER — Telehealth: Payer: Self-pay | Admitting: *Deleted

## 2014-02-10 ENCOUNTER — Encounter: Payer: Self-pay | Admitting: Internal Medicine

## 2014-02-10 ENCOUNTER — Ambulatory Visit (INDEPENDENT_AMBULATORY_CARE_PROVIDER_SITE_OTHER): Payer: BC Managed Care – PPO | Admitting: Internal Medicine

## 2014-02-10 DIAGNOSIS — M25551 Pain in right hip: Secondary | ICD-10-CM | POA: Diagnosis not present

## 2014-02-10 LAB — MULTIPLE MYELOMA PANEL, SERUM
Albumin ELP: 56.1 % (ref 55.8–66.1)
Alpha-1-Globulin: 4.9 % (ref 2.9–4.9)
Alpha-2-Globulin: 11.4 % (ref 7.1–11.8)
Beta 2: 5.4 % (ref 3.2–6.5)
Beta Globulin: 7.3 % — ABNORMAL HIGH (ref 4.7–7.2)
Gamma Globulin: 14.9 % (ref 11.1–18.8)
IgA: 178 mg/dL (ref 69–380)
IgG (Immunoglobin G), Serum: 937 mg/dL (ref 690–1700)
IgM, Serum: 91 mg/dL (ref 52–322)
M-Spike, %: NOT DETECTED g/dL
Total Protein: 6.4 g/dL (ref 6.0–8.3)

## 2014-02-10 NOTE — Telephone Encounter (Signed)
Dr. Megan Salon wanted a BMP yesterday. Need to know when she can come back or if there is another lab she would like it ordered from. Myrtis Hopping

## 2014-02-10 NOTE — Progress Notes (Signed)
Patient ID: Autumn Duran, female   DOB: 05/25/1950, 63 y.o.   MRN: [id number]         [facility] for Infectious Disease  Patient Active Problem List   Diagnosis Date Noted  . Right hip pain 12/23/2013    Priority: High  . Epidural abscess, L2-L5 11/04/2013    Priority: High  . Smoldering myeloma 09/22/2013  . Sepsis 09/21/2013  . Hypercalcemia 08/20/2013  . Hypokalemia 08/20/2013  . Lumbar compression fracture 07/12/2013  . Compression fracture 07/12/2013  . Benign neoplasm of colon 12/18/2011  . Esophageal reflux 12/18/2011  . Other dysphagia 12/18/2011    Patient's Medications  New Prescriptions   No medications on file  Previous Medications   BISACODYL (BISACODYL) 5 MG EC TABLET    Take 5 mg by mouth every other day.   DIAZEPAM (VALIUM) 5 MG TABLET    Take 0.5 tablets (2.5 mg total) by mouth every 8 (eight) hours as needed for anxiety.   DOXYCYCLINE (VIBRAMYCIN) 100 MG CAPSULE    Take 1 capsule (100 mg total) by mouth 2 (two) times daily.   FEEDING SUPPLEMENT (BOOST HIGH PROTEIN) LIQD    Take 1 Container by mouth every other day.    FUROSEMIDE (LASIX) 40 MG TABLET    Take 20 mg by mouth daily.   HYDROCODONE-ACETAMINOPHEN (NORCO) 10-325 MG PER TABLET    Take 1 or 2 tablets every 4 hours to control pain   IBUPROFEN (ADVIL) 200 MG TABLET    Take 200 mg by mouth every 6 (six) hours as needed. Takes 2 when needed   OMEPRAZOLE (PRILOSEC) 40 MG CAPSULE    Take 40 mg by mouth daily as needed (acid reflux).    OXYCODONE (OXYCONTIN) 20 MG T12A 12 HR TABLET    Take 20 mg in the morning and 40 mg at bedtime daily.   POTASSIUM BICARB-CITRIC ACID 20 MEQ TBEF    Take 20 mEq by mouth. Takes when takes furosemide  Modified Medications   No medications on file  Discontinued Medications   GUAIFENESIN (MUCINEX) 600 MG 12 HR TABLET    Take 600 mg by mouth daily as needed.   SENNA-DOCUSATE (SENOKOT-S) 8.6-50 MG PER TABLET    Take 2 tablets by mouth daily.    Subjective: Autumn Duran is in  for her routine follow-up visit. She developed an MRSA epidural abscess following L3 vertebral plasty earlier this year. Her initial antibiotic therapy lasted 2 and half months up until October 7. She began to develop worsening back and right hip pain in late October and restarted doxycycline on October 27. Her inflammatory markers were quite elevated that day but are probably unreliable due to her underlying multiple myeloma. A repeat MRI of her lumbar spine showed improvement. She is back at work at full time but continues to struggle with right hip pain. She has to take her hydrocodone 4 times daily. She continues to use a cane. She states that she is completely exhausted at the end of her work week and spends most of each weekend sleeping. Her right hip pain is worse when she first gets up in the morning, after she has been sitting for more than a few minutes and when she first stands up. She has not had any fever, chills or sweats. She's not having any problem tolerating her doxycycline. Review of Systems: Pertinent items are noted in HPI.  Past Medical History  Diagnosis Date  . Diverticulosis   . Hemorrhoids   . Hx of  adenomatous colonic polyps   . Hiatal hernia   . Hypertension   . Hyperlipidemia   . GERD (gastroesophageal reflux disease)   . Chronic kidney disease     kidney stone s/p stent placement ( removed)  . Complication of anesthesia   . PONV (postoperative nausea and vomiting)   . Bronchitis, chronic   . Chronic back pain   . Lumbar radicular pain   . Compression fracture of L3 lumbar vertebra   . T7 vertebral fracture   . Smoldering myeloma     History  Substance Use Topics  . Smoking status: Never Smoker   . Smokeless tobacco: Never Used  . Alcohol Use: Yes     Comment: social occ    Family History  Problem Relation Age of Onset  . Breast cancer Sister 69  . Breast cancer Other 31    aunt    Allergies  Allergen Reactions  . Morphine And Related Other (See  Comments)    Headache  . Tetracyclines & Related Nausea And Vomiting    Objective: Temp: 98.3 F (36.8 C) (12/15 1333) Temp Source: Oral (12/15 1333) BP: 98/60 mmHg (12/15 1333) Pulse Rate: 74 (12/15 1333)  General: She appears worried Skin: No rash Lungs: Clear Cor: Regular S1 and S2 with no murmurs Abdomen: Obese, soft and nontender Lumbar incision is well-healed. She has point tenderness over her right hip laterally and over her right buttock without other signs of inflammation.  SED RATE (mm/hr)  Date Value  12/23/2013 125*  09/21/2013 7   CRP (mg/dL)  Date Value  12/23/2013 14.6*  09/21/2013 20.9*      Assessment: The cause of her worsening right hip pain remains unclear. I reviewed management options with her and will repeat an MRI of the lumbar spine and also one of her right hip.  Plan: 1. MRI of lumbar spine and right hip 2. BMP today 3. Continue doxycycline for now 4. Follow-up within 4 weeks   Michel Bickers, MD [facility] for [place] [phone] pager   [phone] cell 02/10/2014, 2:15 PM

## 2014-02-11 NOTE — Telephone Encounter (Signed)
Patient called to confirm our fax number.

## 2014-02-11 NOTE — Telephone Encounter (Signed)
Spoke to patient and she is going to [facility] to have labs drawn for her PCP. She is going to see if he will add a BMP and I gave her our fax # to send the results. She will call back to confirm.

## 2014-02-13 ENCOUNTER — Telehealth: Payer: Self-pay | Admitting: *Deleted

## 2014-02-13 NOTE — Telephone Encounter (Addendum)
Notified patient of appointment for MRI at [facility] for Tuesday 02/24/14 at 6:00 pm. She is aware she needs to register in the ED. These tests have been prior authorized from 02/12/14 through 02/12/15. Myrtis Hopping

## 2014-02-17 ENCOUNTER — Encounter: Payer: Self-pay | Admitting: Internal Medicine

## 2014-02-18 ENCOUNTER — Ambulatory Visit: Payer: BC Managed Care – PPO | Admitting: Internal Medicine

## 2014-02-24 ENCOUNTER — Ambulatory Visit (HOSPITAL_COMMUNITY)
Admission: RE | Admit: 2014-02-24 | Discharge: 2014-02-24 | Disposition: A | Discharge status: Home / Self Care | Payer: BC Managed Care – PPO | Source: Ambulatory Visit | Attending: Internal Medicine | Admitting: Internal Medicine

## 2014-02-24 DIAGNOSIS — M868X9 Other osteomyelitis, unspecified sites: Secondary | ICD-10-CM | POA: Insufficient documentation

## 2014-02-24 DIAGNOSIS — M25551 Pain in right hip: Secondary | ICD-10-CM | POA: Diagnosis present

## 2014-02-24 DIAGNOSIS — M4806 Spinal stenosis, lumbar region: Secondary | ICD-10-CM | POA: Diagnosis not present

## 2014-02-24 DIAGNOSIS — M4640 Discitis, unspecified, site unspecified: Secondary | ICD-10-CM | POA: Insufficient documentation

## 2014-02-24 DIAGNOSIS — C9000 Multiple myeloma not having achieved remission: Secondary | ICD-10-CM | POA: Insufficient documentation

## 2014-02-24 DIAGNOSIS — M545 Low back pain: Secondary | ICD-10-CM | POA: Diagnosis present

## 2014-02-24 MED ORDER — GADOBENATE DIMEGLUMINE 529 MG/ML IV SOLN
15.0000 mL | Freq: Once | INTRAVENOUS | Status: AC | PRN
  Administered 2014-02-24: 15 mL via INTRAVENOUS

## 2014-02-27 HISTORY — PX: BONE MARROW BIOPSY: SHX199

## 2014-02-27 HISTORY — PX: BONE MARROW ASPIRATION: SHX1252

## 2014-03-09 ENCOUNTER — Ambulatory Visit (HOSPITAL_COMMUNITY): Payer: BC Managed Care – PPO

## 2014-03-18 ENCOUNTER — Other Ambulatory Visit (HOSPITAL_COMMUNITY): Payer: Self-pay

## 2014-03-18 DIAGNOSIS — C9000 Multiple myeloma not having achieved remission: Secondary | ICD-10-CM

## 2014-03-18 DIAGNOSIS — D472 Monoclonal gammopathy: Secondary | ICD-10-CM

## 2014-03-19 ENCOUNTER — Other Ambulatory Visit (HOSPITAL_COMMUNITY): Payer: Self-pay

## 2014-03-19 ENCOUNTER — Encounter (HOSPITAL_BASED_OUTPATIENT_CLINIC_OR_DEPARTMENT_OTHER): Payer: BLUE CROSS/BLUE SHIELD

## 2014-03-19 ENCOUNTER — Encounter (HOSPITAL_COMMUNITY): Payer: BLUE CROSS/BLUE SHIELD | Attending: Hematology and Oncology | Admitting: Hematology & Oncology

## 2014-03-19 VITALS — BP 146/68 | HR 80 | Temp 98.2°F | Resp 20 | Wt 167.0 lb

## 2014-03-19 DIAGNOSIS — D472 Monoclonal gammopathy: Secondary | ICD-10-CM

## 2014-03-19 DIAGNOSIS — C9000 Multiple myeloma not having achieved remission: Secondary | ICD-10-CM

## 2014-03-19 DIAGNOSIS — C9030 Solitary plasmacytoma not having achieved remission: Secondary | ICD-10-CM

## 2014-03-19 DIAGNOSIS — D72822 Plasmacytosis: Secondary | ICD-10-CM | POA: Insufficient documentation

## 2014-03-19 LAB — CBC WITH DIFFERENTIAL/PLATELET
Basophils Absolute: 0 10*3/uL (ref 0.0–0.1)
Basophils Relative: 1 % (ref 0–1)
Eosinophils Absolute: 0.1 10*3/uL (ref 0.0–0.7)
Eosinophils Relative: 3 % (ref 0–5)
HCT: 33.3 % — ABNORMAL LOW (ref 36.0–46.0)
Hemoglobin: 10.3 g/dL — ABNORMAL LOW (ref 12.0–15.0)
Lymphocytes Relative: 26 % (ref 12–46)
Lymphs Abs: 1.3 10*3/uL (ref 0.7–4.0)
MCH: 23.3 pg — ABNORMAL LOW (ref 26.0–34.0)
MCHC: 30.9 g/dL (ref 30.0–36.0)
MCV: 75.2 fL — ABNORMAL LOW (ref 78.0–100.0)
Monocytes Absolute: 0.3 10*3/uL (ref 0.1–1.0)
Monocytes Relative: 5 % (ref 3–12)
Neutro Abs: 3.3 10*3/uL (ref 1.7–7.7)
Neutrophils Relative %: 65 % (ref 43–77)
Platelets: 396 10*3/uL (ref 150–400)
RBC: 4.43 MIL/uL (ref 3.87–5.11)
RDW: 15.8 % — ABNORMAL HIGH (ref 11.5–15.5)
WBC: 5 10*3/uL (ref 4.0–10.5)

## 2014-03-19 LAB — COMPREHENSIVE METABOLIC PANEL WITH GFR
ALT: 12 U/L (ref 0–35)
AST: 20 U/L (ref 0–37)
Albumin: 3.7 g/dL (ref 3.5–5.2)
Alkaline Phosphatase: 68 U/L (ref 39–117)
Anion gap: 4 — ABNORMAL LOW (ref 5–15)
BUN: 11 mg/dL (ref 6–23)
CO2: 27 mmol/L (ref 19–32)
Calcium: 10 mg/dL (ref 8.4–10.5)
Chloride: 107 meq/L (ref 96–112)
Creatinine, Ser: 0.68 mg/dL (ref 0.50–1.10)
GFR calc Af Amer: >90 mL/min
GFR calc non Af Amer: >90 mL/min
Glucose, Bld: 90 mg/dL (ref 70–99)
Potassium: 3.4 mmol/L — ABNORMAL LOW (ref 3.5–5.1)
Sodium: 138 mmol/L (ref 135–145)
Total Bilirubin: 0.6 mg/dL (ref 0.3–1.2)
Total Protein: 6.6 g/dL (ref 6.0–8.3)

## 2014-03-19 LAB — IRON AND TIBC
Iron: 33 ug/dL — ABNORMAL LOW (ref 42–145)
Saturation Ratios: 8 % — ABNORMAL LOW (ref 20–55)
TIBC: 394 ug/dL (ref 250–470)
UIBC: 361 ug/dL (ref 125–400)

## 2014-03-19 LAB — VITAMIN B12: Vitamin B-12: 496 pg/mL (ref 211–911)

## 2014-03-19 LAB — FERRITIN: Ferritin: 8 ng/mL — ABNORMAL LOW (ref 10–291)

## 2014-03-19 LAB — FOLATE: Folate: 17.4 ng/mL

## 2014-03-19 MED ORDER — DIAZEPAM 5 MG PO TABS: 5.0000 mg | ORAL_TABLET | Freq: Three times a day (TID) | ORAL | Status: DC | PRN

## 2014-03-19 MED ORDER — OXYCODONE HCL ER 20 MG PO T12A: EXTENDED_RELEASE_TABLET | ORAL | Status: DC

## 2014-03-19 MED ORDER — ONDANSETRON HCL 4 MG PO TABS: 4.0000 mg | ORAL_TABLET | Freq: Three times a day (TID) | ORAL | Status: DC | PRN

## 2014-03-19 MED ORDER — HYDROCODONE-ACETAMINOPHEN 10-325 MG PO TABS: ORAL_TABLET | ORAL | Status: DC

## 2014-03-19 NOTE — Progress Notes (Signed)
LABS DRAWN

## 2014-03-19 NOTE — Patient Instructions (Signed)
[place] at [facility]  Discharge Instructions: We are going to arrange for a BMBX and bring you back 2 weeks after to review If you have problems prior to follow-up please call _______________________________________________________________  Thank you for choosing [place] at [facility] to provide your oncology and hematology care.  To afford each patient quality time with our providers, please arrive at least 15 minutes before your scheduled appointment.  You need to re-schedule your appointment if you arrive 10 or more minutes late.  We strive to give you quality time with our providers, and arriving late affects you and other patients whose appointments are after yours.  Also, if you no show three or more times for appointments you may be dismissed from the clinic.  Again, thank you for choosing [place] at [place] hope is that these requests will allow you access to exceptional care and in a timely manner. _______________________________________________________________  If you have questions after your visit, please contact our office at [phone] between the hours of 8:30 a.m. and 5:00 p.m. Voicemails left after 4:30 p.m. will not be returned until the following business day. _______________________________________________________________  For prescription refill requests, have your pharmacy contact our office. _______________________________________________________________  Recommendations made by the consultant and any test results will be sent to your referring physician. _______________________________________________________________

## 2014-03-19 NOTE — Progress Notes (Signed)
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 On 01/06/2014 she was seen by infectious disease who recommended continuing doxycycline therapy.  Osteomyelitis lumbar spine  FINAL DIAGNOSIS Diagnosis Bone, biopsy, lumbar three - LAMELLAR BONE WITH ASSOCIATED FIBROUS TISSUE AND INCREASED PLASMA CELLS. - THERE IS NO EVIDENCE OF MALIGNANCY. - SEE COMMENT. Microscopic Comment Pending immunohistochemical stains. Diagnosis Note There are scattered plasma cells (approximately 5-10%), however, no aggregates are seen. Light chain immunohistochemistry is technically unsatisfactory. Pancytokeratin is negative. Correlation with clinical, radiographic, and laboratory data (SPEP, etc.) is recommended. Vicente Males MD Pathologist, Electronic Signature (Case signed 07/18/2013)   Hypercalcemia mild  Microcytic anemia, Colonoscopy and EGD 11/2011. History of multiple polyps (tubular adenomas, serrated adenomas, hyperplastic polyps)   CURRENT THERAPY: Observation  INTERVAL HISTORY: Autumn Duran 64 y.o. 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  She has osteomyelitis Autumn Duran continues on doxycycline daily. She still works everyday but complains of pain all over.  She does not "feel good"  She does take 1/2 of 15m hydrocodone and 2 advil to get through each day. She works at [employer] She is concerned because she notices a worsening in her baseline fatigue.  MEDICAL HISTORY: Past Medical History  Diagnosis Date  . Diverticulosis   . Hemorrhoids   . Hx of adenomatous colonic polyps     . Hiatal hernia   . Hypertension   . Hyperlipidemia   . GERD (gastroesophageal reflux disease)   . Chronic kidney disease     kidney stone s/p stent placement ( removed)  . Complication of anesthesia   . PONV (postoperative nausea and vomiting)   . Bronchitis, chronic   . Chronic back pain   . Lumbar radicular pain   . Compression fracture of L3 lumbar vertebra   . T7 vertebral fracture   . Smoldering myeloma     has Benign neoplasm of colon; Esophageal reflux; Other dysphagia; Lumbar compression fracture; Compression fracture; Hypercalcemia; Hypokalemia; Sepsis; Smoldering myeloma; Epidural abscess, L2-L5; Right hip pain; and Plasmacytoma of bone on her problem list.     No history exists.     is allergic to morphine and related and tetracyclines & related.  Ms. TTuftehad no medications administered during this visit.  SURGICAL HISTORY: Past Surgical History  Procedure Laterality Date  . Cholecystectomy    . Esophagogastroduodenoscopy  12/18/2011    Procedure: ESOPHAGOGASTRODUODENOSCOPY (EGD);  Surgeon: DLafayette Dragon MD;  [facility];  Service: Endoscopy;  Laterality: N/A;  . Colonoscopy  12/18/2011    Procedure: COLONOSCOPY;  Surgeon: DLafayette Dragon MD;  Location: [facility];  Service: Endoscopy;  Laterality: N/A;  . Ureteral stent placement    . Vertebroplasty N/A 07/14/2013    Procedure: VERTEBROPLASTY WITH LUMBAR THREE BIOPSY;  Surgeon: HCharlie Pitter MD;  [facility];  Service: Neurosurgery;  Laterality: N/A;  VERTEBROPLASTY WITH LUMBAR THREE BIOPSY  . Back surgery    . Lumbar laminectomy for epidural abscess Left 09/22/2013    Procedure: LUMBAR LAMINECTOMY FOR EPIDURAL ABSCESS LEFT LUMBAR FIVE-SACRAL ONE;  Surgeon: HCharlie Pitter MD;  Location: [facility];  Service: Neurosurgery;  Laterality: Left;    SOCIAL HISTORY: History   Social History  . Marital Status: Divorced    Spouse Name: N/A    Number of Children: N/A  . Years of Education:  N/A   Occupational History  . Clerk     [employer]   Social History Main Topics  . Smoking status: Never Smoker   . Smokeless tobacco: Never Used  . Alcohol Use: Yes     Comment: social occ  . Drug Use: No  . Sexual Activity: Not on file   Other Topics Concern  . Not on file   Social History Narrative    FAMILY HISTORY: Family History  Problem Relation Age of Onset  . Breast cancer Sister 40  . Breast cancer Other 31    aunt    Review of Systems  Constitutional: Positive for malaise/fatigue.  HENT: Negative for congestion, ear discharge, ear pain, hearing loss, nosebleeds, sore throat and tinnitus.   Eyes: Negative.   Respiratory: Negative.  Negative for stridor.   Cardiovascular: Negative.   Gastrointestinal: Negative.   Genitourinary: Negative.   Musculoskeletal: Positive for myalgias, back pain, joint pain and neck pain.  Skin: Negative.   Neurological: Positive for weakness. Negative for dizziness, tingling, tremors, sensory change, speech change, focal weakness, seizures, loss of consciousness and headaches.  Endo/Heme/Allergies: Negative.   Psychiatric/Behavioral: Negative.     PHYSICAL EXAMINATION  ECOG PERFORMANCE STATUS: 1  Filed Vitals:   03/19/14 1000  BP: 146/68  Pulse: 80  Temp: 98.2 F (36.8 C)  Resp: 20    Physical Exam  Constitutional: She is oriented to person, place, and time and well-developed, well-nourished, and in no distress.  HENT:  Head: Normocephalic and atraumatic.  Nose: Nose normal.  Mouth/Throat: Oropharynx is clear and moist. No oropharyngeal exudate.  Eyes: Conjunctivae and EOM are normal. Pupils are equal, round, and reactive to light. Right eye exhibits no discharge. Left eye exhibits no discharge. No scleral icterus.  Neck: Normal range of motion. Neck supple. No tracheal deviation present. No thyromegaly present.  Cardiovascular: Normal rate, regular rhythm and normal heart sounds.  Exam reveals no gallop and no  friction rub.   No murmur heard. Pulmonary/Chest: Effort normal and breath sounds normal. She has no wheezes. She has no rales.  Abdominal: Soft. Bowel sounds are normal. She exhibits no distension and no mass. There is no tenderness. There is no rebound and no guarding.  Musculoskeletal: Normal range of motion. She exhibits no edema.  Lymphadenopathy:    She has no cervical adenopathy.  Neurological: She is alert and oriented to person, place, and time. She has normal reflexes. No cranial nerve deficit. Gait normal. Coordination normal.  Skin: Skin is warm and dry. No rash noted.  Psychiatric: Mood, memory, affect and judgment normal.  Nursing note and vitals reviewed.   LABORATORY DATA:  CBC    Component Value Date/Time   WBC 5.0 03/19/2014 1005   RBC 4.43 03/19/2014 1005   RBC 4.79 08/19/2013 1625   HGB 10.3* 03/19/2014 1005   HCT 33.3* 03/19/2014 1005   PLT 396 03/19/2014 1005   MCV 75.2* 03/19/2014 1005   MCH 23.3* 03/19/2014 1005   MCHC 30.9 03/19/2014 1005   RDW 15.8* 03/19/2014 1005   LYMPHSABS 1.3 03/19/2014 1005   MONOABS 0.3 03/19/2014 1005   EOSABS 0.1 03/19/2014 1005   BASOSABS 0.0 03/19/2014 1005   CMP     Component Value Date/Time   NA 138 03/19/2014  1005   K 3.4* 03/19/2014 1005   CL 107 03/19/2014 1005   CO2 27 03/19/2014 1005   GLUCOSE 90 03/19/2014 1005   BUN 11 03/19/2014 1005   CREATININE 0.68 03/19/2014 1005   CALCIUM 10.0 03/19/2014 1005   CALCIUM 10.4* 03/19/2014 1005   PROT 6.6 03/19/2014 1005   ALBUMIN 3.7 03/19/2014 1005   AST 20 03/19/2014 1005   ALT 12 03/19/2014 1005   ALKPHOS 68 03/19/2014 1005   BILITOT 0.6 03/19/2014 1005   GFRNONAA >90 03/19/2014 1005   GFRAA >90 03/19/2014 1005       ASSESSMENT and THERAPY PLAN:    Plasmacytoma of bone Pleasant 64 year old female diagnosed with a plasmacytoma of the lumbar spine. She underwent radiation therapy and a decompression laminectomy. Unfortunately she developed an epidural  abscess, she has osteomyelitis and is maintained on doxycycline. She has never had evidence of an M spike but has had abnormal light chains. In addition, she has hypercalcemia although mild.  She has not had a myeloma survey. She is also not had a bone marrow biopsy. I think the picture is somewhat unclear, especially given her hypercalcemia. She does have a microcytic anemia that has also not been thoroughly evaluated. I have added the following laboratory studies today, PTH, ferritin, serum iron studies, and a myeloma panel. I have also discussed with the patient proceeding with bone marrow biopsy. If she does not have greater than 10% plasma cells in her marrow we can formally rule out myeloma.   I will see her back 2 weeks after bone marrow biopsy to review the above laboratory studies and her marrow results we will make additional recommendations at her next visit. I advised her to call with any questions or concerns in the interim.      Orders Placed This Encounter  Procedures  . CT Biopsy    Order Specific Question:  Lab orders requested (DO NOT place separate lab orders, these will be automatically ordered during procedure specimen collection):    Answer:  Surgical Pathology    Order Specific Question:  Reason for Exam (SYMPTOM  OR DIAGNOSIS REQUIRED)    Answer:  plasmacytoma, hypercalcemia    Order Specific Question:  Preferred imaging location?    Answer:  [facility]    All questions were answered. The patient knows to call the clinic with any problems, questions or concerns. We can certainly see the patient much sooner if necessary.  Molli Hazard 03/23/2014

## 2014-03-22 LAB — KAPPA/LAMBDA LIGHT CHAINS
Kappa free light chain: 22 mg/L — ABNORMAL HIGH (ref 3.30–19.40)
Kappa, lambda light chain ratio: 0.97 (ref 0.26–1.65)
Lambda free light chains: 22.79 mg/L (ref 5.71–26.30)

## 2014-03-22 LAB — PTH, INTACT AND CALCIUM
Calcium, Total (PTH): 10.4 mg/dL — ABNORMAL HIGH (ref 8.7–10.3)
PTH: 42 pg/mL (ref 15–65)

## 2014-03-23 ENCOUNTER — Encounter (HOSPITAL_COMMUNITY): Payer: Self-pay | Admitting: Hematology & Oncology

## 2014-03-23 DIAGNOSIS — C9030 Solitary plasmacytoma not having achieved remission: Secondary | ICD-10-CM | POA: Insufficient documentation

## 2014-03-23 LAB — BETA 2 MICROGLOBULIN, SERUM: Beta-2 Microglobulin: 2.66 mg/L — ABNORMAL HIGH (ref <=2.51)

## 2014-03-23 NOTE — Assessment & Plan Note (Signed)
Pleasant 64 year old female diagnosed with a plasmacytoma of the lumbar spine. She underwent radiation therapy and a decompression laminectomy. Unfortunately she developed an epidural abscess, she has osteomyelitis and is maintained on doxycycline. She has never had evidence of an M spike but has had abnormal light chains. In addition, she has hypercalcemia although mild.  She has not had a myeloma survey. She is also not had a bone marrow biopsy. I think the picture is somewhat unclear, especially given her hypercalcemia. She does have a microcytic anemia that has also not been thoroughly evaluated. I have added the following laboratory studies today, PTH, ferritin, serum iron studies, and a myeloma panel. I have also discussed with the patient proceeding with bone marrow biopsy. If she does not have greater than 10% plasma cells in her marrow we can formally rule out myeloma.   I will see her back 2 weeks after bone marrow biopsy to review the above laboratory studies and her marrow results we will make additional recommendations at her next visit. I advised her to call with any questions or concerns in the interim.

## 2014-03-24 LAB — MULTIPLE MYELOMA PANEL, SERUM
Albumin ELP: 58 % (ref 55.8–66.1)
Alpha-1-Globulin: 4.3 % (ref 2.9–4.9)
Alpha-2-Globulin: 10.7 % (ref 7.1–11.8)
Beta 2: 4.6 % (ref 3.2–6.5)
Beta Globulin: 7.9 % — ABNORMAL HIGH (ref 4.7–7.2)
Gamma Globulin: 14.5 % (ref 11.1–18.8)
IgA: 168 mg/dL (ref 69–380)
IgG (Immunoglobin G), Serum: 928 mg/dL (ref 690–1700)
IgM, Serum: 82 mg/dL (ref 52–322)
M-Spike, %: NOT DETECTED g/dL
Total Protein: 6.5 g/dL (ref 6.0–8.3)

## 2014-03-26 ENCOUNTER — Ambulatory Visit: Payer: Self-pay

## 2014-03-26 LAB — CBC WITH DIFFERENTIAL/PLATELET
Bands: 1 %
Basophil #: 0.1 10*3/uL (ref 0.0–0.1)
Basophil %: 1.1 %
Comment - H1-Com3: NORMAL
Eosinophil #: 0.2 10*3/uL (ref 0.0–0.7)
Eosinophil %: 3.1 %
Eosinophil: 2 %
HCT: 32.5 % — ABNORMAL LOW (ref 35.0–47.0)
HGB: 9.9 g/dL — ABNORMAL LOW (ref 12.0–16.0)
Lymphocyte #: 1.4 10*3/uL (ref 1.0–3.6)
Lymphocyte %: 26 %
Lymphocytes: 29 %
MCH: 22 pg — ABNORMAL LOW (ref 26.0–34.0)
MCHC: 30.4 g/dL — ABNORMAL LOW (ref 32.0–36.0)
MCV: 72 fL — ABNORMAL LOW (ref 80–100)
Monocyte #: 0.5 x10 3/mm (ref 0.2–0.9)
Monocyte %: 9.5 %
Monocytes: 12 %
Neutrophil #: 3.2 10*3/uL (ref 1.4–6.5)
Neutrophil %: 60.3 %
Platelet: 318 10*3/uL (ref 150–440)
RBC: 4.5 10*6/uL (ref 3.80–5.20)
RDW: 17.1 % — ABNORMAL HIGH (ref 11.5–14.5)
Segmented Neutrophils: 56 %
WBC: 5.3 10*3/uL (ref 3.6–11.0)

## 2014-03-30 ENCOUNTER — Ambulatory Visit (INDEPENDENT_AMBULATORY_CARE_PROVIDER_SITE_OTHER): Payer: BLUE CROSS/BLUE SHIELD | Admitting: Internal Medicine

## 2014-03-30 ENCOUNTER — Encounter: Payer: Self-pay | Admitting: Internal Medicine

## 2014-03-30 VITALS — BP 149/73 | HR 73 | Temp 98.2°F | Wt 168.0 lb

## 2014-03-30 DIAGNOSIS — M25551 Pain in right hip: Secondary | ICD-10-CM

## 2014-03-30 DIAGNOSIS — G061 Intraspinal abscess and granuloma: Secondary | ICD-10-CM

## 2014-03-30 NOTE — Progress Notes (Signed)
Patient ID: Autumn Duran, female   DOB: Mar 12, 1950, 64 y.o.   MRN: [id number]         [facility] for Infectious Disease  Patient Active Problem List   Diagnosis Date Noted  . Right hip pain 12/23/2013    Priority: High  . Epidural abscess, L2-L5 11/04/2013    Priority: High  . 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 Smoldering myeloma 09/22/2013  . Sepsis 09/21/2013  . Hypercalcemia 08/20/2013  . Hypokalemia 08/20/2013  . Lumbar compression fracture 07/12/2013  . Compression fracture 07/12/2013  . Benign neoplasm of colon 12/18/2011  . Esophageal reflux 12/18/2011  . Other dysphagia 12/18/2011    Patient's Medications  New Prescriptions   No medications on file  Previous Medications   DIAZEPAM (VALIUM) 5 MG TABLET    Take 1 tablet (5 mg total) by mouth every 8 (eight) hours as needed for anxiety.   DOXYCYCLINE (VIBRA-TABS) 100 MG TABLET    Take 100 mg by mouth daily.    FUROSEMIDE (LASIX) 40 MG TABLET    Take 20 mg by mouth daily.   HYDROCODONE-ACETAMINOPHEN (NORCO) 10-325 MG PER TABLET    Take 1 or 2 tablets every 4 hours to control pain   IBUPROFEN (ADVIL) 200 MG TABLET    Take 200 mg by mouth every 6 (six) hours as needed. Takes 2 when needed   MELOXICAM (MOBIC) 15 MG TABLET       OMEPRAZOLE (PRILOSEC) 40 MG CAPSULE    Take 40 mg by mouth daily as needed (acid reflux).    ONDANSETRON (ZOFRAN) 4 MG TABLET    Take 1 tablet (4 mg total) by mouth every 8 (eight) hours as needed for nausea or vomiting.   OXYCODONE (OXYCONTIN) 20 MG T12A 12 HR TABLET    Take 20 mg in the morning and 40 mg at bedtime daily.   POTASSIUM BICARB-CITRIC ACID 20 MEQ TBEF    Take 20 mEq by mouth. Takes when takes furosemide   POTASSIUM CHLORIDE SA (K-DUR,KLOR-CON) 20 MEQ TABLET       SENNA (SENOKOT) 8.6 MG TABLET    Take 1 tablet by mouth every other day.   TOBRAMYCIN-DEXAMETHASONE (TOBRADEX) OPHTHALMIC SOLUTION    Place 1 drop into the left eye every 4 (four) hours while awake.   Modified Medications    No medications on file  Discontinued Medications   No medications on file    Subjective: Autumn Duran is in for her routine follow-up visit. She developed an MRSA epidural abscess following L3 vertebral plasty last year. Her initial antibiotic therapy lasted 2 and half months up until October 7. She began to develop worsening back and right hip pain in late October and restarted doxycycline on October 27. Her inflammatory markers were quite elevated that day but are probably unreliable due to her underlying multiple myeloma. She is back at work at full time.   She is feeling better. Her low back and right hip pain have improved. She has been able to be more active. She is requiring only 1-2 hydrocodone daily for pain. She now only takes her oxycodone as needed as well. She is not needing to use her cane. She's not having any problem tolerating her doxycycline. She saw Dr. Trenton Gammon recently and he recommended decreasing it to once daily since she seemed to be having some stomach upset when taking it twice daily.  She recently developed a focal, blistering rash that was initially painful on her left outer thigh. It is getting better  spontaneously. She has no history of shingles.  Review of Systems: Pertinent items are noted in HPI.  Past Medical History  Diagnosis Date  . Diverticulosis   . Hemorrhoids   . Hx of adenomatous colonic polyps   . Hiatal hernia   . Hypertension   . Hyperlipidemia   . GERD (gastroesophageal reflux disease)   . Chronic kidney disease     kidney stone s/p stent placement ( removed)  . Complication of anesthesia   . PONV (postoperative nausea and vomiting)   . Bronchitis, chronic   . Chronic back pain   . Lumbar radicular pain   . Compression fracture of L3 lumbar vertebra   . T7 vertebral fracture   . Smoldering myeloma     History  Substance Use Topics  . Smoking status: Never Smoker   . Smokeless tobacco: Never Used  . Alcohol Use: Yes     Comment: social  occ    Family History  Problem Relation Age of Onset  . Breast cancer Sister 48  . Breast cancer Other 60    aunt    Allergies  Allergen Reactions  . Morphine And Related Other (See Comments)    Headache  . Tetracyclines & Related Nausea And Vomiting    Objective: Temp: 98.2 F (36.8 C) (02/01 1550) Temp Source: Oral (02/01 1550) BP: 149/73 mmHg (02/01 1550) Pulse Rate: 73 (02/01 1550)  General: She is smiling and in good spirits Skin: She has 5-6 healing ulcers on her left lateral 5 compatible with shingles   Assessment: Her low back and right hip pain are improving. It is unclear if she had a relapse of her MRSA infection or if her worsening back pain was related to myeloma. I talked to her about the potential downside of long-term antibiotics, specifically the risk of C. difficile colitis. She states she will consider stopping doxycycline again at the time of her next visit.  Plan: 1. Continue doxycycline for now 2. Follow-up in 6 weeks    , [facility] [phone] pager   [phone] cell 03/30/2014, 4:07 PM 

## 2014-04-10 ENCOUNTER — Encounter (HOSPITAL_COMMUNITY): Payer: BLUE CROSS/BLUE SHIELD | Attending: Hematology and Oncology | Admitting: Hematology & Oncology

## 2014-04-10 ENCOUNTER — Encounter (HOSPITAL_COMMUNITY): Payer: Self-pay | Admitting: Hematology & Oncology

## 2014-04-10 VITALS — BP 129/72 | HR 64 | Temp 98.3°F | Resp 16 | Wt 165.3 lb

## 2014-04-10 DIAGNOSIS — D72822 Plasmacytosis: Secondary | ICD-10-CM | POA: Insufficient documentation

## 2014-04-10 DIAGNOSIS — D508 Other iron deficiency anemias: Secondary | ICD-10-CM

## 2014-04-10 DIAGNOSIS — C9030 Solitary plasmacytoma not having achieved remission: Secondary | ICD-10-CM

## 2014-04-10 NOTE — Patient Instructions (Addendum)
[place] at [facility]  Discharge Instructions:  Discussion by Dr. Whitney Muse. You are iron deficient and we will get you scheduled for iron infusions. Will be getting feraheme weekly x 3 weeks  Follow-up in 1 months with labs and office visit _______________________________________________________________  Thank you for choosing [employer] at [facility] to provide your oncology and hematology care.  To afford each patient quality time with our providers, please arrive at least 15 minutes before your scheduled appointment.  You need to re-schedule your appointment if you arrive 10 or more minutes late.  We strive to give you quality time with our providers, and arriving late affects you and other patients whose appointments are after yours.  Also, if you no show three or more times for appointments you may be dismissed from the clinic.  Again, thank you for choosing [place] [facility] [place] hope is that these requests will allow you access to exceptional care and in a timely manner. _______________________________________________________________  If you have questions after your visit, please contact our office at [phone] between the hours of 8:30 a.m. and 5:00 p.m. Voicemails left after 4:30 p.m. will not be returned until the following business day. _______________________________________________________________  For prescription refill requests, have your pharmacy contact our office. _______________________________________________________________  Recommendations made by the consultant and any test results will be sent to your referring physician. _______________________________________________________________Iron Deficiency Anemia Anemia is a condition in which there are less red blood cells or hemoglobin in the blood than normal. Hemoglobin is the part of red blood cells that carries oxygen. Iron deficiency  anemia is anemia caused by too little iron. It is the most common type of anemia. It may leave you tired and short of breath. CAUSES   Lack of iron in the diet.  Poor absorption of iron, as seen with intestinal disorders.  Intestinal bleeding.  Heavy periods. SIGNS AND SYMPTOMS  Mild anemia may not be noticeable. Symptoms may include:  Fatigue.  Headache.  Pale skin.  Weakness.  Tiredness.  Shortness of breath.  Dizziness.  Cold hands and feet.  Fast or irregular heartbeat. DIAGNOSIS  Diagnosis requires a thorough evaluation and physical exam by your health care provider. Blood tests are generally used to confirm iron deficiency anemia. Additional tests may be done to find the underlying cause of your anemia. These may include:  Testing for blood in the stool (fecal occult blood test).  A procedure to see inside the colon and rectum (colonoscopy).  A procedure to see inside the esophagus and stomach (endoscopy). TREATMENT  Iron deficiency anemia is treated by correcting the cause of the deficiency. Treatment may involve:  Adding iron-rich foods to your diet.  Taking iron supplements. Pregnant or breastfeeding women need to take extra iron because their normal diet usually does not provide the required amount.  Taking vitamins. Vitamin C improves the absorption of iron. Your health care provider may recommend that you take your iron tablets with a glass of orange juice or vitamin C supplement.  Medicines to make heavy menstrual flow lighter.  Surgery. HOME CARE INSTRUCTIONS   Take iron as directed by your health care provider.  If you cannot tolerate taking iron supplements by mouth, talk to your health care provider about taking them through a vein (intravenously) or an injection into a muscle.  For the best iron absorption, iron supplements should be taken on an empty stomach. If you cannot tolerate them  on an empty stomach, you may need to take them with  food.  Do not drink milk or take antacids at the same time as your iron supplements. Milk and antacids may interfere with the absorption of iron.  Iron supplements can cause constipation. Make sure to include fiber in your diet to prevent constipation. A stool softener may also be recommended.  Take vitamins as directed by your health care provider.  Eat a diet rich in iron. Foods high in iron include liver, lean beef, whole-grain bread, eggs, dried fruit, and dark green leafy vegetables. SEEK IMMEDIATE MEDICAL CARE IF:   You faint. If this happens, do not drive. Call your local emergency services (911 in [place] if no other help is available.  You have chest pain.  You feel nauseous or vomit.  You have severe or increased shortness of breath with activity.  You feel weak.  You have a rapid heartbeat.  You have unexplained sweating.  You become light-headed when getting up from a chair or bed. MAKE SURE YOU:   Understand these instructions.  Will watch your condition.  Will get help right away if you are not doing well or get worse. Document Released: 02/11/2000 Document Revised: 02/18/2013 Document Reviewed: 10/21/2012 [facility] Patient Information 2015 [place], [place]. This information is not intended to replace advice given to you by your health care provider. Make sure you discuss any questions you have with your health care provider. Ferumoxytol injection What is this medicine? FERUMOXYTOL is an iron complex. Iron is used to make healthy red blood cells, which carry oxygen and nutrients throughout the body. This medicine is used to treat iron deficiency anemia in people with chronic kidney disease. This medicine may be used for other purposes; ask your health care provider or pharmacist if you have questions. COMMON BRAND NAME(S): Feraheme What should I tell my health care provider before I take this medicine? They need to know if you have any of these conditions: -anemia  not caused by low iron levels -high levels of iron in the blood -magnetic resonance imaging (MRI) test scheduled -an unusual or allergic reaction to iron, other medicines, foods, dyes, or preservatives -pregnant or trying to get pregnant -breast-feeding How should I use this medicine? This medicine is for injection into a vein. It is given by a health care professional in a hospital or clinic setting. Talk to your pediatrician regarding the use of this medicine in children. Special care may be needed. Overdosage: If you think you've taken too much of this medicine contact a poison control center or emergency room at once. Overdosage: If you think you have taken too much of this medicine contact a poison control center or emergency room at once. NOTE: This medicine is only for you. Do not share this medicine with others. What if I miss a dose? It is important not to miss your dose. Call your doctor or health care professional if you are unable to keep an appointment. What may interact with this medicine? This medicine may interact with the following medications: -other iron products This list may not describe all possible interactions. Give your health care provider a list of all the medicines, herbs, non-prescription drugs, or dietary supplements you use. Also tell them if you smoke, drink alcohol, or use illegal drugs. Some items may interact with your medicine. What should I watch for while using this medicine? Visit your doctor or healthcare professional regularly. Tell your doctor or healthcare professional if your symptoms do not  start to get better or if they get worse. You may need blood work done while you are taking this medicine. You may need to follow a special diet. Talk to your doctor. Foods that contain iron include: whole grains/cereals, dried fruits, beans, or peas, leafy green vegetables, and organ meats (liver, kidney). What side effects may I notice from receiving this  medicine? Side effects that you should report to your doctor or health care professional as soon as possible: -allergic reactions like skin rash, itching or hives, swelling of the face, lips, or tongue -breathing problems -changes in blood pressure -feeling faint or lightheaded, falls -fever or chills -flushing, sweating, or hot feelings -swelling of the ankles or feet Side effects that usually do not require medical attention (Report these to your doctor or health care professional if they continue or are bothersome.): -diarrhea -headache -nausea, vomiting -stomach pain This list may not describe all possible side effects. Call your doctor for medical advice about side effects. You may report side effects to FDA at 1-800-FDA-1088. Where should I keep my medicine? This drug is given in a hospital or clinic and will not be stored at home. NOTE: This sheet is a summary. It may not cover all possible information. If you have questions about this medicine, talk to your doctor, pharmacist, or health care provider.  2015, Elsevier/Gold Standard. (2011-09-29 15:23:36)

## 2014-04-10 NOTE — Progress Notes (Addendum)
   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 On 01/06/2014 she was seen by infectious disease who recommended continuing doxycycline therapy.  Osteomyelitis lumbar spine   Diagnosis Bone, biopsy, lumbar three - LAMELLAR BONE WITH ASSOCIATED FIBROUS TISSUE AND INCREASED PLASMA CELLS. - THERE IS NO EVIDENCE OF MALIGNANCY. - SEE COMMENT. Microscopic Comment Pending immunohistochemical stains. Diagnosis Note There are scattered plasma cells (approximately 5-10%), however, no aggregates are seen. Light chain immunohistochemistry is technically unsatisfactory. Pancytokeratin is negative. Correlation with clinical, radiographic, and laboratory data (SPEP, etc.) is recommended. JULIA MANNY MD Pathologist, Electronic Signature (Case signed 07/18/2013)  Hypercalcemia mild, normal PTH  Microcytic anemia, Colonoscopy and EGD 11/2011. History of multiple polyps (tubular adenomas, serrated adenomas, hyperplastic polyps)  No evidence of an M spike, normal immunoglobulin levels, normal Kappa/Lambda ratio  Bone marrow biopsy and aspirate on 03/26/2014 showing no diagnostic morphologic evidence of plasma cell neoplasm. Normocellular to slightly hypercellular marrow particles with maturing trilineage hematopoiesis, and mild nonspecific dyserythroipoiesis. No significant iron stores  Iron deficiency anemia, colonoscopy in 2013 with polyps and internal hemorrhoids, on recall in 2018. EGD in 2013 with irregular Z line and stricture. Pathology without evidence of Barrett's   CURRENT THERAPY: Observation  INTERVAL HISTORY: Autumn M  Duran 64 y.o. 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  She has osteomyelitis and continues on doxycycline daily. She still complains of pain all over, but mostly joint pain. It is worse in the am.  She has a prescription for mobic. She does not recall who prescribed the medication for her but has questions as to whether or not she could try it for her joint stiffness and pain. She is here today for review of her bone marrow biopsy results.   MEDICAL HISTORY: Past Medical History  Diagnosis Date  . Diverticulosis   . Hemorrhoids   . Hx of adenomatous colonic polyps   . Hiatal hernia   . Hypertension   . Hyperlipidemia   . GERD (gastroesophageal reflux disease)   . Chronic kidney disease     kidney stone s/p stent placement ( removed)  . Complication of anesthesia   . PONV (postoperative nausea and vomiting)   . Bronchitis, chronic   . Chronic back pain   . Lumbar radicular pain   . Compression fracture of L3 lumbar vertebra   . T7 vertebral fracture   . Smoldering myeloma     has Benign neoplasm of colon; Esophageal reflux; Other dysphagia; Lumbar compression fracture; Compression fracture; Hypercalcemia; Hypokalemia; Sepsis; Epidural abscess, L2-L5; Right hip pain; Plasmacytoma of bone; and Other iron deficiency anemias on her problem list.     No history exists.     is allergic to morphine and related and tetracyclines & related.  Ms. Krager had no medications administered during this visit.  SURGICAL HISTORY: Past Surgical History  Procedure Laterality Date  . Cholecystectomy    . Esophagogastroduodenoscopy  12/18/2011    Procedure: ESOPHAGOGASTRODUODENOSCOPY (EGD);  Surgeon: Dora M Brodie, MD;  Location: [facility];  Service: Endoscopy;  Laterality: N/A;  . Colonoscopy  12/18/2011    Procedure: COLONOSCOPY;  Surgeon: Dora M Brodie, MD;  Location:   [facility] ENDOSCOPY;  Service: Endoscopy;  Laterality: N/A;  . Ureteral stent placement    . Vertebroplasty N/A 07/14/2013      Procedure: VERTEBROPLASTY WITH LUMBAR THREE BIOPSY;  Surgeon: Henry A Pool, MD;  Location: [facility]UR[facility];  Service: Neurosurgery;  Laterality: N/A;  VERTEBROPLASTY WITH LUMBAR THREE BIOPSY  . Back surgery    . Lumbar laminectomy for epidural abscess Left 09/22/2013    Procedure: LUMBAR LAMINECTOMY FOR EPIDURAL ABSCESS LEFT LUMBAR FIVE-SACRAL ONE;  Surgeon: Henry A Pool, MD;  Location: [facility] NEUR[facility];  Service: Neurosurgery;  Laterality: Left;  . Bone marrow biopsy Left 02/2014  . Bone marrow aspiration Left 02/2014    SOCIAL HISTORY: History   Social History  . Marital Status: Single    Spouse Name: N/A  . Number of Children: N/A  . Years of Education: N/A   Occupational History  . Clerk     [employer]   Social History Main Topics  . Smoking status: Never Smoker   . Smokeless tobacco: Never Used  . Alcohol Use: Yes     Comment: social occ  . Drug Use: No  . Sexual Activity: Not on file   Other Topics Concern  . Not on file   Social History Narrative    FAMILY HISTORY: Family History  Problem Relation Age of Onset  . Breast cancer Sister 48  . Breast cancer Other 60    aunt    Review of Systems  Constitutional: Negative.   HENT: Negative.   Eyes: Negative.   Respiratory: Negative.   Cardiovascular: Negative.   Genitourinary: Negative.   Musculoskeletal: Positive for myalgias and joint pain.  Skin: Negative.   Neurological: Negative.   Endo/Heme/Allergies: Negative.   Psychiatric/Behavioral: Negative.     PHYSICAL EXAMINATION  ECOG PERFORMANCE STATUS: 1  Filed Vitals:   04/10/14 0950  BP: 129/72  Pulse: 64  Temp: 98.3 F (36.8 C)  Resp: 16    Physical Exam  Constitutional: She is oriented to person, place, and time and well-developed, well-nourished, and in no distress.  HENT:  Head: Normocephalic and atraumatic.  Nose: Nose normal.  Mouth/Throat: Oropharynx is clear and moist. No oropharyngeal exudate.  Eyes: Conjunctivae and EOM are normal.  Pupils are equal, round, and reactive to light. Right eye exhibits no discharge. Left eye exhibits no discharge. No scleral icterus.  Neck: Normal range of motion. Neck supple. No tracheal deviation present. No thyromegaly present.  Cardiovascular: Normal rate, regular rhythm and normal heart sounds.  Exam reveals no gallop and no friction rub.   No murmur heard. Pulmonary/Chest: Effort normal and breath sounds normal. She has no wheezes. She has no rales.  Abdominal: Soft. Bowel sounds are normal. She exhibits no distension and no mass. There is no tenderness. There is no rebound and no guarding.  Musculoskeletal: Normal range of motion. She exhibits no edema.  Lymphadenopathy:    She has no cervical adenopathy.  Neurological: She is alert and oriented to person, place, and time. She has normal reflexes. No cranial nerve deficit. Gait normal. Coordination normal.  Skin: Skin is warm and dry. No rash noted.  Psychiatric: Mood, memory, affect and judgment normal.  Nursing note and vitals reviewed.   LABORATORY DATA:  CBC    Component Value Date/Time   WBC 5.0 03/19/2014 1005   RBC 4.43 03/19/2014 1005   RBC 4.79 08/19/2013 1625   HGB 10.3* 03/19/2014 1005   HCT 33.3* 03/19/2014 1005   PLT 396 03/19/2014 1005     MCV 75.2* 03/19/2014 1005   MCH 23.3* 03/19/2014 1005   MCHC 30.9 03/19/2014 1005   RDW 15.8* 03/19/2014 1005   LYMPHSABS 1.3 03/19/2014 1005   MONOABS 0.3 03/19/2014 1005   EOSABS 0.1 03/19/2014 1005   BASOSABS 0.0 03/19/2014 1005   CMP     Component Value Date/Time   NA 138 03/19/2014 1005   K 3.4* 03/19/2014 1005   CL 107 03/19/2014 1005   CO2 27 03/19/2014 1005   GLUCOSE 90 03/19/2014 1005   BUN 11 03/19/2014 1005   CREATININE 0.68 03/19/2014 1005   CALCIUM 10.0 03/19/2014 1005   CALCIUM 10.4* 03/19/2014 1005   PROT 6.6 03/19/2014 1005   PROT 6.5 03/19/2014 1005   ALBUMIN 3.7 03/19/2014 1005   AST 20 03/19/2014 1005   ALT 12 03/19/2014 1005   ALKPHOS 68  03/19/2014 1005   BILITOT 0.6 03/19/2014 1005   GFRNONAA >90 03/19/2014 1005   GFRAA >90 03/19/2014 1005       ASSESSMENT and THERAPY PLAN:    Plasmacytoma of bone Pleasant 63-year-old female with a solitary plasmacytoma. Bone marrow biopsy shows no evidence of monoclonal plasma cell neoplasm. I discussed the results of her bone marrow biopsy in detail with her today. We spent time discussing diagnosis of plasmacytoma. I have advised her I recommend ongoing observation, but she does not have smoldering myeloma as previously told.   Other iron deficiency anemias Kevonna has evidence of iron deficiency based on her laboratory studies. She has had a colonoscopy and EGD in 2013. I have recommended iron replacement, she has been on oral iron before and has problems with abdominal pain. We will replace her with IV iron and I will see her back in 1 months with repeat laboratory studies. Hopefully improving her anemia and replacing her iron stores will help some of her fatigue.    Orders Placed This Encounter  Procedures  . CBC with Differential    Standing Status: Future     Number of Occurrences:      Standing Expiration Date: 04/11/2015  . Comprehensive metabolic panel    Standing Status: Future     Number of Occurrences:      Standing Expiration Date: 04/11/2015  . Ferritin    Standing Status: Future     Number of Occurrences:      Standing Expiration Date: 04/11/2015  . Iron and TIBC    Standing Status: Future     Number of Occurrences:      Standing Expiration Date: 04/11/2015  . Reticulocytes    Standing Status: Future     Number of Occurrences:      Standing Expiration Date: 04/11/2015    All questions were answered. The patient knows to call the clinic with any problems, questions or concerns. We can certainly see the patient much sooner if necessary.  , Kristen 04/13/2014    Please note bone marrow biopsy has normal cytogenetics 

## 2014-04-13 NOTE — Assessment & Plan Note (Signed)
Pleasant 64 year old female with a solitary plasmacytoma. Bone marrow biopsy shows no evidence of monoclonal plasma cell neoplasm. I discussed the results of her bone marrow biopsy in detail with her today. We spent time discussing diagnosis of plasmacytoma. I have advised her I recommend ongoing observation, but she does not have smoldering myeloma as previously told.

## 2014-04-13 NOTE — Assessment & Plan Note (Signed)
Autumn Duran has evidence of iron deficiency based on her laboratory studies. She has had a colonoscopy and EGD in 2013. I have recommended iron replacement, she has been on oral iron before and has problems with abdominal pain. We will replace her with IV iron and I will see her back in 1 months with repeat laboratory studies. Hopefully improving her anemia and replacing her iron stores will help some of her fatigue.

## 2014-04-17 ENCOUNTER — Encounter (HOSPITAL_BASED_OUTPATIENT_CLINIC_OR_DEPARTMENT_OTHER): Payer: BLUE CROSS/BLUE SHIELD

## 2014-04-17 ENCOUNTER — Encounter (HOSPITAL_COMMUNITY): Payer: Self-pay

## 2014-04-17 ENCOUNTER — Encounter: Payer: Self-pay | Admitting: Dietician

## 2014-04-17 DIAGNOSIS — D508 Other iron deficiency anemias: Secondary | ICD-10-CM

## 2014-04-17 MED ORDER — SODIUM CHLORIDE 0.9 % IV SOLN
510.0000 mg | Freq: Once | INTRAVENOUS | Status: AC
  Administered 2014-04-17: 510 mg via INTRAVENOUS
  Filled 2014-04-17: qty 17

## 2014-04-17 MED ORDER — SODIUM CHLORIDE 0.9 % IV SOLN
Freq: Once | INTRAVENOUS | Status: AC
  Administered 2014-04-17: 15:00:00 via INTRAVENOUS

## 2014-04-17 NOTE — Progress Notes (Signed)
Tolerated well

## 2014-04-17 NOTE — Progress Notes (Signed)
Patient identified to be at risk for malnutrition on the MST secondary to Weight loss  Contacted Pt by phone   Wt Readings from Last 10 Encounters:  04/10/14 165 lb 4.8 oz (74.98 kg)  03/30/14 168 lb (76.204 kg)  03/19/14 167 lb (75.751 kg)  02/24/14 169 lb (76.658 kg)  02/10/14 172 lb (78.019 kg)  02/05/14 169 lb 1.6 oz (76.703 kg)  01/06/14 174 lb 12 oz (79.266 kg)  01/05/14 171 lb 14.4 oz (77.973 kg)  12/23/13 174 lb (78.926 kg)  12/11/13 177 lb (80.287 kg)   Patient weight has decreased by ~12 pounds in the last 4 months. Upon speaking to patient, patient said she is glad she has lost the weight and "feels great"  Patient reports oral intake as good and is not suffering from any poor oral intake/GI distress  Pt seemed to be happy with her current state of health  No nutrition intervention required.   Burtis Junes RD, LDN Nutrition Pager: [phone] 04/17/2014 3:51 PM

## 2014-04-21 ENCOUNTER — Encounter: Payer: Self-pay | Admitting: Hematology & Oncology

## 2014-04-24 ENCOUNTER — Encounter (HOSPITAL_BASED_OUTPATIENT_CLINIC_OR_DEPARTMENT_OTHER): Payer: BLUE CROSS/BLUE SHIELD

## 2014-04-24 DIAGNOSIS — D508 Other iron deficiency anemias: Secondary | ICD-10-CM

## 2014-04-24 MED ORDER — SODIUM CHLORIDE 0.9 % IV SOLN
Freq: Once | INTRAVENOUS | Status: AC
  Administered 2014-04-24: 14:00:00 via INTRAVENOUS

## 2014-04-24 MED ORDER — SODIUM CHLORIDE 0.9 % IV SOLN
510.0000 mg | Freq: Once | INTRAVENOUS | Status: AC
  Administered 2014-04-24: 510 mg via INTRAVENOUS
  Filled 2014-04-24: qty 17

## 2014-04-24 NOTE — Progress Notes (Signed)
Tolerated iron infusion well. 

## 2014-04-24 NOTE — Patient Instructions (Signed)
[facility] at [facility] Discharge Instructions  RECOMMENDATIONS MADE BY THE CONSULTANT AND ANY TEST RESULTS WILL BE SENT TO YOUR REFERRING PHYSICIAN.  Iron infusion today as ordered. Return as scheduled.  Thank you for choosing [facility] at [facility] to provide your oncology and hematology care.  To afford each patient quality time with our provider, please arrive at least 15 minutes before your scheduled appointment time.    You need to re-schedule your appointment should you arrive 10 or more minutes late.  We strive to give you quality time with our providers, and arriving late affects you and other patients whose appointments are after yours.  Also, if you no show three or more times for appointments you may be dismissed from the clinic at the providers discretion.     Again, thank you for choosing [facility].  Our hope is that these requests will decrease the amount of time that you wait before being seen by our physicians.       _____________________________________________________________  Should you have questions after your visit to [facility], please contact our office at [phone] between the hours of 8:30 a.m. and 4:30 p.m.  Voicemails left after 4:30 p.m. will not be returned until the following business day.  For prescription refill requests, have your pharmacy contact our office.    

## 2014-04-29 ENCOUNTER — Telehealth (HOSPITAL_COMMUNITY): Payer: Self-pay | Admitting: Hematology & Oncology

## 2014-04-29 NOTE — Telephone Encounter (Signed)
AUTH NOT REQUIRED FOR [id number] PER Denman George T 03/31/2014

## 2014-05-01 ENCOUNTER — Encounter (HOSPITAL_COMMUNITY): Payer: Self-pay

## 2014-05-01 ENCOUNTER — Encounter (HOSPITAL_COMMUNITY): Payer: BLUE CROSS/BLUE SHIELD | Attending: Hematology and Oncology

## 2014-05-01 DIAGNOSIS — N189 Chronic kidney disease, unspecified: Secondary | ICD-10-CM

## 2014-05-01 DIAGNOSIS — D508 Other iron deficiency anemias: Secondary | ICD-10-CM

## 2014-05-01 DIAGNOSIS — D72822 Plasmacytosis: Secondary | ICD-10-CM | POA: Insufficient documentation

## 2014-05-01 MED ORDER — FERUMOXYTOL INJECTION 510 MG/17 ML
510.0000 mg | Freq: Once | INTRAVENOUS | Status: AC
  Administered 2014-05-01: 510 mg via INTRAVENOUS
  Filled 2014-05-01: qty 17

## 2014-05-01 MED ORDER — SODIUM CHLORIDE 0.9 % IV SOLN: Freq: Once | INTRAVENOUS | Status: DC

## 2014-05-01 NOTE — Patient Instructions (Signed)
[place] at [facility] Discharge Instructions  RECOMMENDATIONS MADE BY THE CONSULTANT AND ANY TEST RESULTS WILL BE SENT TO YOUR REFERRING PHYSICIAN.  You received on bag of iv iron today. We will see you next time for labs and office visit.  Call for any questions or concerns.  Thank you for choosing [place] at [facility] to provide your oncology and hematology care.  To afford each patient quality time with our provider, please arrive at least 15 minutes before your scheduled appointment time.    You need to re-schedule your appointment should you arrive 10 or more minutes late.  We strive to give you quality time with our providers, and arriving late affects you and other patients whose appointments are after yours.  Also, if you no show three or more times for appointments you may be dismissed from the clinic at the providers discretion.     Again, thank you for choosing [facility].  Our hope is that these requests will decrease the amount of time that you wait before being seen by our physicians.       _____________________________________________________________  Should you have questions after your visit to [facility], please contact our office at [phone] between the hours of 8:30 a.m. and 4:30 p.m.  Voicemails left after 4:30 p.m. will not be returned until the following business day.  For prescription refill requests, have your pharmacy contact our office.

## 2014-05-08 ENCOUNTER — Encounter (HOSPITAL_COMMUNITY): Payer: Self-pay | Admitting: Hematology & Oncology

## 2014-05-08 ENCOUNTER — Encounter (HOSPITAL_BASED_OUTPATIENT_CLINIC_OR_DEPARTMENT_OTHER): Payer: BLUE CROSS/BLUE SHIELD

## 2014-05-08 ENCOUNTER — Encounter (HOSPITAL_BASED_OUTPATIENT_CLINIC_OR_DEPARTMENT_OTHER): Payer: BLUE CROSS/BLUE SHIELD | Admitting: Hematology & Oncology

## 2014-05-08 VITALS — BP 129/63 | HR 66 | Temp 98.0°F | Resp 15 | Wt 161.6 lb

## 2014-05-08 DIAGNOSIS — D509 Iron deficiency anemia, unspecified: Secondary | ICD-10-CM

## 2014-05-08 DIAGNOSIS — D508 Other iron deficiency anemias: Secondary | ICD-10-CM

## 2014-05-08 DIAGNOSIS — C9030 Solitary plasmacytoma not having achieved remission: Secondary | ICD-10-CM

## 2014-05-08 DIAGNOSIS — C9000 Multiple myeloma not having achieved remission: Secondary | ICD-10-CM

## 2014-05-08 DIAGNOSIS — D72822 Plasmacytosis: Secondary | ICD-10-CM | POA: Diagnosis not present

## 2014-05-08 LAB — CBC WITH DIFFERENTIAL/PLATELET
Basophils Absolute: 0 10*3/uL (ref 0.0–0.1)
Basophils Relative: 1 % (ref 0–1)
Eosinophils Absolute: 0.1 10*3/uL (ref 0.0–0.7)
Eosinophils Relative: 2 % (ref 0–5)
HCT: 36.4 % (ref 36.0–46.0)
Hemoglobin: 11.2 g/dL — ABNORMAL LOW (ref 12.0–15.0)
Lymphocytes Relative: 36 % (ref 12–46)
Lymphs Abs: 1.6 10*3/uL (ref 0.7–4.0)
MCH: 24.7 pg — ABNORMAL LOW (ref 26.0–34.0)
MCHC: 30.8 g/dL (ref 30.0–36.0)
MCV: 80.2 fL (ref 78.0–100.0)
Monocytes Absolute: 0.2 10*3/uL (ref 0.1–1.0)
Monocytes Relative: 6 % (ref 3–12)
Neutro Abs: 2.4 10*3/uL (ref 1.7–7.7)
Neutrophils Relative %: 56 % (ref 43–77)
Platelets: 333 10*3/uL (ref 150–400)
RBC: 4.54 MIL/uL (ref 3.87–5.11)
RDW: 22.4 % — ABNORMAL HIGH (ref 11.5–15.5)
WBC: 4.4 10*3/uL (ref 4.0–10.5)

## 2014-05-08 LAB — IRON AND TIBC
Iron: 82 ug/dL (ref 42–145)
Saturation Ratios: 32 % (ref 20–55)
TIBC: 260 ug/dL (ref 250–470)
UIBC: 178 ug/dL (ref 125–400)

## 2014-05-08 LAB — COMPREHENSIVE METABOLIC PANEL
ALT: 19 U/L (ref 0–35)
AST: 25 U/L (ref 0–37)
Albumin: 3.7 g/dL (ref 3.5–5.2)
Alkaline Phosphatase: 90 U/L (ref 39–117)
Anion gap: 6 (ref 5–15)
BUN: 13 mg/dL (ref 6–23)
CO2: 28 mmol/L (ref 19–32)
Calcium: 9.7 mg/dL (ref 8.4–10.5)
Chloride: 107 mmol/L (ref 96–112)
Creatinine, Ser: 0.64 mg/dL (ref 0.50–1.10)
GFR calc Af Amer: >90 mL/min (ref >90)
GFR calc non Af Amer: >90 mL/min (ref >90)
Glucose, Bld: 99 mg/dL (ref 70–99)
Potassium: 3.4 mmol/L — ABNORMAL LOW (ref 3.5–5.1)
Sodium: 141 mmol/L (ref 135–145)
Total Bilirubin: 0.4 mg/dL (ref 0.3–1.2)
Total Protein: 6.9 g/dL (ref 6.0–8.3)

## 2014-05-08 LAB — RETICULOCYTES
RBC.: 4.54 MIL/uL (ref 3.87–5.11)
Retic Count, Absolute: 40.9 10*3/uL (ref 19.0–186.0)
Retic Ct Pct: 0.9 % (ref 0.4–3.1)

## 2014-05-08 MED ORDER — RABEPRAZOLE SODIUM 20 MG PO TBEC
20.0000 mg | DELAYED_RELEASE_TABLET | Freq: Every day | ORAL | Status: DC
Start: 2014-05-08 — End: 2017-06-20

## 2014-05-08 NOTE — Progress Notes (Signed)
Autumn Duran presented for labwork. Labs per MD order drawn via Peripheral Line 23 gauge needle inserted in right AC  Good blood return present. Procedure without incident.  Needle removed intact. Patient tolerated procedure well.

## 2014-05-08 NOTE — Progress Notes (Signed)
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 On 01/06/2014 she was seen by infectious disease who recommended continuing doxycycline therapy.  Osteomyelitis lumbar spine   Diagnosis Bone, biopsy, lumbar three - LAMELLAR BONE WITH ASSOCIATED FIBROUS TISSUE AND INCREASED PLASMA CELLS. - THERE IS NO EVIDENCE OF MALIGNANCY. - SEE COMMENT. Microscopic Comment Pending immunohistochemical stains. Diagnosis Note There are scattered plasma cells (approximately 5-10%), however, no aggregates are seen. Light chain immunohistochemistry is technically unsatisfactory. Pancytokeratin is negative. Correlation with clinical, radiographic, and laboratory data (SPEP, etc.) is recommended. Autumn Males MD Pathologist, Electronic Signature (Case signed 07/18/2013)  Hypercalcemia mild, normal PTH  Microcytic anemia, Colonoscopy and EGD 11/2011. History of multiple polyps (tubular adenomas, serrated adenomas, hyperplastic polyps)  No evidence of an M spike, normal immunoglobulin levels, normal Kappa/Lambda ratio  Bone marrow biopsy and aspirate on 03/26/2014 showing no diagnostic morphologic evidence of plasma cell neoplasm. Normocellular to slightly hypercellular marrow particles with maturing trilineage hematopoiesis, and mild nonspecific dyserythroipoiesis. No significant iron stores  Iron deficiency anemia, colonoscopy in 2013 with polyps and internal hemorrhoids, on recall in 2018. EGD in 2013 with irregular Z line and stricture. Pathology without evidence of Barrett's  IV iron replacement 2/19, 2/26, 3/4   CURRENT THERAPY:  Observation  INTERVAL HISTORY: Autumn Duran 64 y.o. 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  She has osteomyelitis and continues on doxycycline daily. She feels some of her joint pain is better since receiving IV iron. She notices an improvement in her energy level. She realistically has few complaints today. Her mood is good.  MEDICAL HISTORY: Past Medical History  Diagnosis Date  . Diverticulosis   . Hemorrhoids   . Hx of adenomatous colonic polyps   . Hiatal hernia   . Hypertension   . Hyperlipidemia   . GERD (gastroesophageal reflux disease)   . Chronic kidney disease     kidney stone s/p stent placement ( removed)  . Complication of anesthesia   . PONV (postoperative nausea and vomiting)   . Bronchitis, chronic   . Chronic back pain   . Lumbar radicular pain   . Compression fracture of L3 lumbar vertebra   . T7 vertebral fracture   . Smoldering myeloma   . Sore throat     has Benign neoplasm of colon; Esophageal reflux; Other dysphagia; Lumbar compression fracture; Compression fracture; Hypercalcemia; Hypokalemia; Sepsis; Epidural abscess, L2-L5; Right hip pain; Plasmacytoma of bone; and Other iron deficiency anemias on her problem list.     No history exists.     is allergic to morphine and related and tetracyclines & related.  Autumn Duran does not currently have medications on file.  SURGICAL HISTORY: Past Surgical History  Procedure Laterality Date  . Cholecystectomy    . Esophagogastroduodenoscopy  12/18/2011    Procedure: ESOPHAGOGASTRODUODENOSCOPY (EGD);  Surgeon: Lafayette Dragon, MD;  Location: [facility];  Service: Endoscopy;  Laterality: N/A;  . Colonoscopy  12/18/2011    Procedure: COLONOSCOPY;  Surgeon: Lafayette Dragon, MD;  Location: [facility];  Service: Endoscopy;  Laterality: N/A;  . Ureteral stent placement    . Vertebroplasty N/A 07/14/2013  Procedure: VERTEBROPLASTY WITH LUMBAR THREE BIOPSY;  Surgeon: Charlie Pitter, MD;  Location: [facility];  Service: Neurosurgery;  Laterality: N/A;  VERTEBROPLASTY WITH LUMBAR THREE BIOPSY  . Back surgery    . Lumbar laminectomy for epidural abscess Left 09/22/2013    Procedure: LUMBAR LAMINECTOMY FOR EPIDURAL ABSCESS LEFT LUMBAR FIVE-SACRAL ONE;  Surgeon: Charlie Pitter, MD;  Location: [facility];  Service: Neurosurgery;  Laterality: Left;  . Bone marrow biopsy Left 02/2014  . Bone marrow aspiration Left 02/2014    SOCIAL HISTORY: History   Social History  . Marital Status: Single    Spouse Name: N/A  . Number of Children: N/A  . Years of Education: N/A   Occupational History  . Clerk     [employer]   Social History Main Topics  . Smoking status: Never Smoker   . Smokeless tobacco: Never Used  . Alcohol Use: Yes     Comment: social occ  . Drug Use: No  . Sexual Activity: Not on file   Other Topics Concern  . Not on file   Social History Narrative    FAMILY HISTORY: Family History  Problem Relation Age of Onset  . Breast cancer Sister 61  . Breast cancer Other 7    aunt    Review of Systems  Constitutional: Positive for malaise/fatigue. Negative for fever, chills and weight loss.  HENT: Negative for congestion, hearing loss, nosebleeds, sore throat and tinnitus.   Eyes: Negative for blurred vision, double vision, pain and discharge.  Respiratory: Negative for cough, hemoptysis, sputum production, shortness of breath and wheezing.   Cardiovascular: Negative for chest pain, palpitations, claudication, leg swelling and PND.  Gastrointestinal: Negative for heartburn, nausea, vomiting, abdominal pain, diarrhea, constipation, blood in stool and melena.  Genitourinary: Negative for dysuria, urgency, frequency and hematuria.  Musculoskeletal: Positive for back pain and joint pain. Negative for myalgias and falls.  Skin: Negative for itching and rash.  Neurological: Negative for dizziness, tingling, tremors, sensory change, speech change, focal weakness, seizures,  loss of consciousness, weakness and headaches.  Endo/Heme/Allergies: Does not bruise/bleed easily.  Psychiatric/Behavioral: Negative for depression, suicidal ideas, memory loss and substance abuse. The patient is not nervous/anxious and does not have insomnia.     PHYSICAL EXAMINATION  ECOG PERFORMANCE STATUS: 1  Filed Vitals:   05/08/14 1200  BP: 129/63  Pulse: 66  Temp: 98 F (36.7 C)  Resp: 15    Physical Exam  Constitutional: She is oriented to person, place, and time and well-developed, well-nourished, and in no distress.  HENT:  Head: Normocephalic and atraumatic.  Nose: Nose normal.  Mouth/Throat: Oropharynx is clear and moist. No oropharyngeal exudate.  Eyes: Conjunctivae and EOM are normal. Pupils are equal, round, and reactive to light. Right eye exhibits no discharge. Left eye exhibits no discharge. No scleral icterus.  Neck: Normal range of motion. Neck supple. No tracheal deviation present. No thyromegaly present.  Cardiovascular: Normal rate, regular rhythm and normal heart sounds.  Exam reveals no gallop and no friction rub.   No murmur heard. Pulmonary/Chest: Effort normal and breath sounds normal. She has no wheezes. She has no rales.  Abdominal: Soft. Bowel sounds are normal. She exhibits no distension and no mass. There is no tenderness. There is no rebound and no guarding.  Musculoskeletal: Normal range of motion. She exhibits no edema.  Lymphadenopathy:    She has no cervical adenopathy.  Neurological: She is alert and oriented to person, place, and time.  She has normal reflexes. No cranial nerve deficit. Gait normal. Coordination normal.  Skin: Skin is warm and dry. No rash noted.  Psychiatric: Mood, memory, affect and judgment normal.  Nursing note and vitals reviewed.   LABORATORY DATA:  CBC    Component Value Date/Time   WBC 5.0 03/19/2014 1005   RBC 4.43 03/19/2014 1005   RBC 4.79 08/19/2013 1625   HGB 10.3* 03/19/2014 1005   HCT 33.3*  03/19/2014 1005   PLT 396 03/19/2014 1005   MCV 75.2* 03/19/2014 1005   MCH 23.3* 03/19/2014 1005   MCHC 30.9 03/19/2014 1005   RDW 15.8* 03/19/2014 1005   LYMPHSABS 1.3 03/19/2014 1005   MONOABS 0.3 03/19/2014 1005   EOSABS 0.1 03/19/2014 1005   BASOSABS 0.0 03/19/2014 1005   CMP     Component Value Date/Time   NA 138 03/19/2014 1005   K 3.4* 03/19/2014 1005   CL 107 03/19/2014 1005   CO2 27 03/19/2014 1005   GLUCOSE 90 03/19/2014 1005   BUN 11 03/19/2014 1005   CREATININE 0.68 03/19/2014 1005   CALCIUM 10.0 03/19/2014 1005   CALCIUM 10.4* 03/19/2014 1005   PROT 6.6 03/19/2014 1005   PROT 6.5 03/19/2014 1005   ALBUMIN 3.7 03/19/2014 1005   AST 20 03/19/2014 1005   ALT 12 03/19/2014 1005   ALKPHOS 68 03/19/2014 1005   BILITOT 0.6 03/19/2014 1005   GFRNONAA >90 03/19/2014 1005   GFRAA >90 03/19/2014 1005       ASSESSMENT and THERAPY PLAN:   Plasmacytoma  She received radiation to the lumbar spine. Bone marrow biopsy has been performed showed no evidence of a plasma cell neoplasm. Kappa lambda light chain ratio has been normal, SPEP/IEP has been normal. I recommended ongoing observation and monitoring of laboratory studies. We will see her back in 3 months.  Iron deficiency anemia  Colonoscopy was performed in October 2013. 8 polyps were removed. Pathology was consistent with tubular adenomas, hyperplastic polyps, and 3 serrated adenomas. No dysplasia or malignancy was found. She is due for recall in 2018. We will check iron levels again in 3 months. If her iron levels have dropped again or she is anemic again I will refer her back to GI for additional evaluation.   All questions were answered. The patient knows to call the clinic with any problems, questions or concerns. We can certainly see the patient much sooner if necessary.  Molli Hazard MD 05/08/2014    Please note bone marrow biopsy has normal cytogenetics

## 2014-05-08 NOTE — Patient Instructions (Signed)
[place] at [facility] Discharge Instructions  RECOMMENDATIONS MADE BY THE CONSULTANT AND ANY TEST RESULTS WILL BE SENT TO YOUR REFERRING PHYSICIAN.  Exam and discussion by Dr. Whitney Muse Will call you if any concerns with your blood work  Call with any concerns.  Labs in 6 weeks and office visit in 3 months.  Thank you for choosing [place] at [facility] to provide your oncology and hematology care.  To afford each patient quality time with our provider, please arrive at least 15 minutes before your scheduled appointment time.    You need to re-schedule your appointment should you arrive 10 or more minutes late.  We strive to give you quality time with our providers, and arriving late affects you and other patients whose appointments are after yours.  Also, if you no show three or more times for appointments you may be dismissed from the clinic at the providers discretion.     Again, thank you for choosing [facility].  Our hope is that these requests will decrease the amount of time that you wait before being seen by our physicians.       _____________________________________________________________  Should you have questions after your visit to [facility], please contact our office at [phone] between the hours of 8:30 a.m. and 4:30 p.m.  Voicemails left after 4:30 p.m. will not be returned until the following business day.  For prescription refill requests, have your pharmacy contact our office.

## 2014-05-09 LAB — FERRITIN: Ferritin: 586 ng/mL — ABNORMAL HIGH (ref 10–291)

## 2014-05-11 ENCOUNTER — Encounter: Payer: Self-pay | Admitting: Internal Medicine

## 2014-05-11 ENCOUNTER — Ambulatory Visit (INDEPENDENT_AMBULATORY_CARE_PROVIDER_SITE_OTHER): Payer: BLUE CROSS/BLUE SHIELD | Admitting: Internal Medicine

## 2014-05-11 VITALS — BP 129/65 | HR 73 | Temp 98.2°F | Wt 160.0 lb

## 2014-05-11 DIAGNOSIS — G061 Intraspinal abscess and granuloma: Secondary | ICD-10-CM

## 2014-05-11 NOTE — Progress Notes (Signed)
[place] for Infectious Disease  Patient Active Problem List   Diagnosis Date Noted  . Right hip pain 12/23/2013    Priority: High  . Epidural abscess, L2-L5 11/04/2013    Priority: High  . Other iron deficiency anemias 04/10/2014  . 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 Sepsis 09/21/2013  . Hypercalcemia 08/20/2013  . Hypokalemia 08/20/2013  . Lumbar compression fracture 07/12/2013  . Compression fracture 07/12/2013  . Benign neoplasm of colon 12/18/2011  . Esophageal reflux 12/18/2011  . Other dysphagia 12/18/2011    Patient's Medications  New Prescriptions   No medications on file  Previous Medications   AMOXICILLIN (AMOXIL) 500 MG CAPSULE    Take 500 mg by mouth 3 (three) times daily.   DIAZEPAM (VALIUM) 5 MG TABLET    Take 1 tablet (5 mg total) by mouth every 8 (eight) hours as needed for anxiety.   FUROSEMIDE (LASIX) 40 MG TABLET    Take 40 mg by mouth as needed.    HYDROCODONE-ACETAMINOPHEN (NORCO) 10-325 MG PER TABLET    Take 1 or 2 tablets every 4 hours to control pain   HYDROCODONE-HOMATROPINE (HYCODAN) 5-1.5 MG/5ML SYRUP    Take 5 mLs by mouth as needed.   IBUPROFEN (ADVIL) 200 MG TABLET    Take 200 mg by mouth every 6 (six) hours as needed. Takes 2 when needed   MELOXICAM (MOBIC) 15 MG TABLET    15 mg daily.    OMEPRAZOLE (PRILOSEC) 40 MG CAPSULE    Take 40 mg by mouth daily as needed (acid reflux).    ONDANSETRON (ZOFRAN) 4 MG TABLET    Take 1 tablet (4 mg total) by mouth every 8 (eight) hours as needed for nausea or vomiting.   OXYCODONE (OXYCONTIN) 20 MG T12A 12 HR TABLET    Take 20 mg in the morning and 40 mg at bedtime daily.   POTASSIUM CHLORIDE SA (K-DUR,KLOR-CON) 20 MEQ TABLET       RABEPRAZOLE (ACIPHEX) 20 MG TABLET    Take 1 tablet (20 mg total) by mouth daily.   SENNA (SENOKOT) 8.6 MG TABLET    Take 1 tablet by mouth as needed.    TOBRAMYCIN-DEXAMETHASONE (TOBRADEX) OPHTHALMIC SOLUTION    Place 1 drop into the left eye every 4 (four) hours  while awake.   Modified Medications   No medications on file  Discontinued Medications   DOXYCYCLINE (VIBRA-TABS) 100 MG TABLET    Take 100 mg by mouth daily.     Subjective: Autumn Duran is in for her routine follow-up for MRSA lumbar infection. She decided to stop taking her doxycycline 3 weeks ago because of concern that she might develop complications of chronic antibiotic therapy. She developed some bronchitis and was started on amoxicillin recently which he continues to take. Her bronchitis has resolved. She helped a neighbor move 10 days ago. This past weekend she noted increased back pain and had to take several doses of her narcotic pain medication. However, today her pain is back to normal and she worked a full day without any difficulty. She has not had any fever or chills. She has not had any diarrhea.  Review of Systems: Pertinent items are noted in HPI.  Past Medical History  Diagnosis Date  . Diverticulosis   . Hemorrhoids   . Hx of adenomatous colonic polyps   . Hiatal hernia   . Hypertension   . Hyperlipidemia   . GERD (gastroesophageal reflux disease)   .  Chronic kidney disease     kidney stone s/p stent placement ( removed)  . Complication of anesthesia   . PONV (postoperative nausea and vomiting)   . Bronchitis, chronic   . Chronic back pain   . Lumbar radicular pain   . Compression fracture of L3 lumbar vertebra   . T7 vertebral fracture   . Smoldering myeloma   . Sore throat     History  Substance Use Topics  . Smoking status: Never Smoker   . Smokeless tobacco: Never Used  . Alcohol Use: Yes     Comment: social occ    Family History  Problem Relation Age of Onset  . Breast cancer Sister 23  . Breast cancer Other 38    aunt    Allergies  Allergen Reactions  . Morphine And Related Other (See Comments)    Headache  . Tetracyclines & Related Nausea And Vomiting    Objective: Temp: 98.2 F (36.8 C) (03/14 1555) Temp Source: Oral (03/14  1555) BP: 129/65 mmHg (03/14 1555) Pulse Rate: 73 (03/14 1555)  General: She is in good spirits and in no distress She has no unusual tenderness over her lower back and her gait is normal   Assessment: I am hopeful that her MRSA lumbar infection has been cured but only time will tell. I doubt that her transient increase in back pain this weekend is related to infection since it seems to have improved without any specific antibiotic therapy for MRSA.  Plan: 1. Continue observation off of doxycycline 2. Follow-up in 2 months   Michel Bickers, MD [facility] for [place] [phone] pager   [phone] cell 05/11/2014, 4:12 PM

## 2014-06-18 ENCOUNTER — Encounter (HOSPITAL_COMMUNITY): Payer: BLUE CROSS/BLUE SHIELD | Attending: Hematology and Oncology

## 2014-06-18 DIAGNOSIS — D72822 Plasmacytosis: Secondary | ICD-10-CM | POA: Insufficient documentation

## 2014-06-18 DIAGNOSIS — D508 Other iron deficiency anemias: Secondary | ICD-10-CM

## 2014-06-18 LAB — CBC WITH DIFFERENTIAL/PLATELET
Basophils Absolute: 0.1 10*3/uL (ref 0.0–0.1)
Basophils Relative: 1 % (ref 0–1)
Eosinophils Absolute: 0.2 10*3/uL (ref 0.0–0.7)
Eosinophils Relative: 3 % (ref 0–5)
HCT: 38.7 % (ref 36.0–46.0)
Hemoglobin: 12.3 g/dL (ref 12.0–15.0)
Lymphocytes Relative: 29 % (ref 12–46)
Lymphs Abs: 2.1 10*3/uL (ref 0.7–4.0)
MCH: 26.2 pg (ref 26.0–34.0)
MCHC: 31.8 g/dL (ref 30.0–36.0)
MCV: 82.3 fL (ref 78.0–100.0)
Monocytes Absolute: 0.3 10*3/uL (ref 0.1–1.0)
Monocytes Relative: 5 % (ref 3–12)
Neutro Abs: 4.4 10*3/uL (ref 1.7–7.7)
Neutrophils Relative %: 62 % (ref 43–77)
Platelets: 395 10*3/uL (ref 150–400)
RBC: 4.7 MIL/uL (ref 3.87–5.11)
RDW: 18.6 % — ABNORMAL HIGH (ref 11.5–15.5)
WBC: 7 10*3/uL (ref 4.0–10.5)

## 2014-06-18 NOTE — Progress Notes (Unsigned)
Requests application for Disability Parking Sticker, not able to walk far due to back pain. Needs refill for Oxycontin 20 mg and requests prescription for Motrin 800 mg.  States "I try to use motrin 800 mg in between strong pain meds."  Would like to pick up prescription and application on Monday if possible.

## 2014-06-19 LAB — FERRITIN: Ferritin: 308 ng/mL — ABNORMAL HIGH (ref 10–291)

## 2014-06-23 ENCOUNTER — Other Ambulatory Visit (HOSPITAL_COMMUNITY): Payer: Self-pay | Admitting: Oncology

## 2014-06-23 ENCOUNTER — Telehealth (HOSPITAL_COMMUNITY): Payer: Self-pay | Admitting: *Deleted

## 2014-06-23 DIAGNOSIS — S32000S Wedge compression fracture of unspecified lumbar vertebra, sequela: Secondary | ICD-10-CM

## 2014-06-23 DIAGNOSIS — C9030 Solitary plasmacytoma not having achieved remission: Secondary | ICD-10-CM

## 2014-06-23 MED ORDER — HYDROCODONE-ACETAMINOPHEN 10-325 MG PO TABS: ORAL_TABLET | ORAL | Status: DC

## 2014-06-23 NOTE — Telephone Encounter (Signed)
She is long overdue for her Oxycontin.  Does she need this medication or Hydrocodone?  If she needs Oxycontin, how is she taking it and does she have new pain?  KEFALAS,THOMAS

## 2014-06-24 ENCOUNTER — Other Ambulatory Visit (HOSPITAL_COMMUNITY): Payer: Self-pay | Admitting: Oncology

## 2014-06-24 DIAGNOSIS — C9030 Solitary plasmacytoma not having achieved remission: Secondary | ICD-10-CM

## 2014-06-24 MED ORDER — OXYCODONE HCL ER 20 MG PO T12A: 20.0000 mg | EXTENDED_RELEASE_TABLET | Freq: Two times a day (BID) | ORAL | Status: DC

## 2014-06-24 MED ORDER — IBUPROFEN 800 MG PO TABS: 800.0000 mg | ORAL_TABLET | Freq: Three times a day (TID) | ORAL | Status: DC | PRN

## 2014-06-24 NOTE — Telephone Encounter (Signed)
Spoke with patient and has not been taking oxycontin as prescribed.  Has only been taking as needed.  Reinforced that if she's going to take it, she should take it every 12 hours, that it's a long acting pain med and does not work on an as needed basis.  Verbalized understanding that she will take it every 12 hours.

## 2014-07-14 ENCOUNTER — Ambulatory Visit (INDEPENDENT_AMBULATORY_CARE_PROVIDER_SITE_OTHER): Payer: BLUE CROSS/BLUE SHIELD | Admitting: Internal Medicine

## 2014-07-14 ENCOUNTER — Encounter: Payer: Self-pay | Admitting: Internal Medicine

## 2014-07-14 ENCOUNTER — Telehealth (HOSPITAL_COMMUNITY): Payer: Self-pay

## 2014-07-14 ENCOUNTER — Other Ambulatory Visit (HOSPITAL_COMMUNITY): Payer: Self-pay | Admitting: Oncology

## 2014-07-14 DIAGNOSIS — G061 Intraspinal abscess and granuloma: Secondary | ICD-10-CM

## 2014-07-14 LAB — SEDIMENTATION RATE: Sed Rate: 27 mm/hr (ref 0–30)

## 2014-07-14 LAB — C-REACTIVE PROTEIN: CRP: <0.5 mg/dL (ref <0.60)

## 2014-07-14 NOTE — Telephone Encounter (Signed)
-----   Message from Baird Cancer, PA-C sent at 07/14/2014  4:28 PM EDT ----- Too soon for refill on Oxycontin and Hydrocodone.  Please verify how much Hydrocodone she is taking.  If she is out of Hydrocodone, then we need to increase Oxycontin dose and decrease the number of Hydrocodone tablets.  Please advise.   ----- Message -----    From: Epifanio Lesches    Sent: 07/14/2014  10:45 AM      To: Baird Cancer, PA-C  Needs a refill on Oxycodone

## 2014-07-14 NOTE — Telephone Encounter (Signed)
Call back from [place].  Only prescription that needs to be refilled is the Hydrocodone.  Last prescription filled for Hydrocodone was one written by Dr. Trenton Gammon. (confirmed with [facility]) and it was filled on 05/20/14.  Only prescriptions she got filled from [place] were for the  Oxycontin 20 mg and Motrin 800 mg on 06/24/14.  Autumn Duran is taking only 4 hydrocodone tablets/24 hours and is taking oxycontin every 12 hours as prescribed.  States this is working well for her.  Has 4 hydrocodone tablets left before she runs out.

## 2014-07-14 NOTE — Progress Notes (Addendum)
Patient ID: Autumn Duran, female   DOB: 06/23/1950, 64 y.o.   MRN: [id number]         [facility] for Infectious Disease  Patient Active Problem List   Diagnosis Date Noted  . Right hip pain 12/23/2013    Priority: High  . Epidural abscess, L2-L5 11/04/2013    Priority: High  . Other iron deficiency anemias 04/10/2014  . 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 Sepsis 09/21/2013  . Hypercalcemia 08/20/2013  . Hypokalemia 08/20/2013  . Lumbar compression fracture 07/12/2013  . Compression fracture 07/12/2013  . Benign neoplasm of colon 12/18/2011  . Esophageal reflux 12/18/2011  . Other dysphagia 12/18/2011    Patient's Medications  New Prescriptions   No medications on file  Previous Medications   DIAZEPAM (VALIUM) 5 MG TABLET    Take 1 tablet (5 mg total) by mouth every 8 (eight) hours as needed for anxiety.   FUROSEMIDE (LASIX) 40 MG TABLET    Take 40 mg by mouth as needed.    HYDROCODONE-ACETAMINOPHEN (NORCO) 10-325 MG PER TABLET    Take 1 or 2 tablets every 4 hours to control pain   IBUPROFEN (ADVIL,MOTRIN) 800 MG TABLET    Take 1 tablet (800 mg total) by mouth every 8 (eight) hours as needed for mild pain.   MELOXICAM (MOBIC) 15 MG TABLET    15 mg daily.    OMEPRAZOLE (PRILOSEC) 40 MG CAPSULE    Take 40 mg by mouth daily as needed (acid reflux).    ONDANSETRON (ZOFRAN) 4 MG TABLET    Take 1 tablet (4 mg total) by mouth every 8 (eight) hours as needed for nausea or vomiting.   OXYCODONE (OXYCONTIN) 20 MG T12A 12 HR TABLET    Take 1 tablet (20 mg total) by mouth every 12 (twelve) hours.   POTASSIUM CHLORIDE SA (K-DUR,KLOR-CON) 20 MEQ TABLET       RABEPRAZOLE (ACIPHEX) 20 MG TABLET    Take 1 tablet (20 mg total) by mouth daily.   SENNA (SENOKOT) 8.6 MG TABLET    Take 1 tablet by mouth as needed.    TOBRAMYCIN-DEXAMETHASONE (TOBRADEX) OPHTHALMIC SOLUTION    Place 1 drop into the left eye every 4 (four) hours while awake.   Modified Medications   No medications on file    Discontinued Medications   AMOXICILLIN (AMOXIL) 500 MG CAPSULE    Take 500 mg by mouth 3 (three) times daily.   HYDROCODONE-HOMATROPINE (HYCODAN) 5-1.5 MG/5ML SYRUP    Take 5 mLs by mouth as needed.    Subjective: Autumn Duran is in for her routine follow-up for her MRSA lumbar epidural abscess. She completed therapy with doxycycline in late February. She states that about one month ago she began to notice an increase in her right hip pain. She continues to take her OxyContin. She always takes 1 at bedtime and usually one dose in the morning. She has increased her hydrocodone from 1 tablet twice daily to 2 tablets twice daily. Since changing her pain medication her pain has been under better control. It bothers her most when she is sitting at work. She has not had any fever, chills or sweats. When her pain worsened one month ago she became worried that her infection was coming back. She did not call but decided on her own to restart doxycycline about 2 weeks ago. However she has not been taking it regularly. She estimates that she is only taken about 6 doses in the past 2 weeks. She  states that she remains extremely fatigued and when she gets home she just wants to sleep. She states that her appetite is poor but also admits that she has been trying to lose weight and has been eating less.  Review of Systems: Pertinent items are noted in HPI.  Past Medical History  Diagnosis Date  . Diverticulosis   . Hemorrhoids   . Hx of adenomatous colonic polyps   . Hiatal hernia   . Hypertension   . Hyperlipidemia   . GERD (gastroesophageal reflux disease)   . Chronic kidney disease     kidney stone s/p stent placement ( removed)  . Complication of anesthesia   . PONV (postoperative nausea and vomiting)   . Bronchitis, chronic   . Chronic back pain   . Lumbar radicular pain   . Compression fracture of L3 lumbar vertebra   . T7 vertebral fracture   . Smoldering myeloma   . Sore throat     History   Substance Use Topics  . Smoking status: Never Smoker   . Smokeless tobacco: Never Used  . Alcohol Use: Yes     Comment: social occ    Family History  Problem Relation Age of Onset  . Breast cancer Sister 35  . Breast cancer Other 68    aunt    Allergies  Allergen Reactions  . Morphine And Related Other (See Comments)    Headache  . Tetracyclines & Related Nausea And Vomiting    Objective: Temp: 97.9 F (36.6 C) (05/17 0953) Temp Source: Oral (05/17 0953) BP: 133/76 mmHg (05/17 0953) Pulse Rate: 58 (05/17 0953)  General: She appears worried but otherwise in no distress. Her weight is down 10 pounds to 150.25 No pain, redness, or swelling over her lower back or hips  Lab Results SED RATE (mm/hr)  Date Value  12/23/2013 125*  09/21/2013 7   CRP (mg/dL)  Date Value  12/23/2013 14.6*  09/21/2013 20.9*      Assessment: I am not sure if her recent increase in right hip pain is due to a recurrence of her MRSA infection. I will recheck her sedimentation rate and C-reactive protein. Although these markers are nonspecific, if they remain elevated I will consider repeat MRI scan of her lumbar spine and hips. I've asked her to stop taking doxycycline for now.  Plan: 1. Stop doxycycline 2. Check sedimentation rate and C-reactive protein. I will call her tomorrow with results. 3. Follow-up in 4 weeks   Michel Bickers, MD [facility] for [place] [phone] pager   [phone] cell 07/14/2014, 10:21 AM   Addendum:  SED RATE (mm/hr)  Date Value  07/14/2014 27  12/23/2013 125*  09/21/2013 7   CRP (mg/dL)  Date Value  07/14/2014 <0.5  12/23/2013 14.6*  09/21/2013 20.9*   Her inflammatory markers have normalized suggesting that her MRSA infection has been cured. She will stay off of antibiotics for now.  Michel Bickers, MD [facility] for [place] [facility] [phone] pager   [phone]  cell 07/15/2014, 2:03 PM

## 2014-07-15 ENCOUNTER — Other Ambulatory Visit (HOSPITAL_COMMUNITY): Payer: Self-pay | Admitting: Oncology

## 2014-07-15 DIAGNOSIS — S32000S Wedge compression fracture of unspecified lumbar vertebra, sequela: Secondary | ICD-10-CM

## 2014-07-15 DIAGNOSIS — C9030 Solitary plasmacytoma not having achieved remission: Secondary | ICD-10-CM

## 2014-07-15 MED ORDER — HYDROCODONE-ACETAMINOPHEN 10-325 MG PO TABS: ORAL_TABLET | ORAL | Status: DC

## 2014-07-15 NOTE — Telephone Encounter (Signed)
Thank you.  I vaguely remember that.  New Rx printed for Hydrocodone.  Harleen Fineberg 07/15/2014 12:49 PM

## 2014-08-07 ENCOUNTER — Encounter (HOSPITAL_COMMUNITY): Payer: BLUE CROSS/BLUE SHIELD | Attending: Hematology and Oncology

## 2014-08-07 ENCOUNTER — Ambulatory Visit (HOSPITAL_COMMUNITY): Payer: BLUE CROSS/BLUE SHIELD | Admitting: Hematology & Oncology

## 2014-08-07 ENCOUNTER — Encounter (HOSPITAL_BASED_OUTPATIENT_CLINIC_OR_DEPARTMENT_OTHER): Payer: BLUE CROSS/BLUE SHIELD | Admitting: Hematology & Oncology

## 2014-08-07 ENCOUNTER — Encounter (HOSPITAL_COMMUNITY): Payer: Self-pay | Admitting: Hematology & Oncology

## 2014-08-07 VITALS — BP 121/59 | HR 56 | Temp 97.8°F | Resp 16 | Wt 152.2 lb

## 2014-08-07 DIAGNOSIS — D72822 Plasmacytosis: Secondary | ICD-10-CM | POA: Diagnosis not present

## 2014-08-07 DIAGNOSIS — D508 Other iron deficiency anemias: Secondary | ICD-10-CM

## 2014-08-07 DIAGNOSIS — C9030 Solitary plasmacytoma not having achieved remission: Secondary | ICD-10-CM

## 2014-08-07 LAB — CBC WITH DIFFERENTIAL/PLATELET
Basophils Absolute: 0.1 10*3/uL (ref 0.0–0.1)
Basophils Relative: 1 % (ref 0–1)
Eosinophils Absolute: 0.2 10*3/uL (ref 0.0–0.7)
Eosinophils Relative: 2 % (ref 0–5)
HCT: 39.4 % (ref 36.0–46.0)
Hemoglobin: 12.3 g/dL (ref 12.0–15.0)
Lymphocytes Relative: 30 % (ref 12–46)
Lymphs Abs: 2.2 10*3/uL (ref 0.7–4.0)
MCH: 27.4 pg (ref 26.0–34.0)
MCHC: 31.2 g/dL (ref 30.0–36.0)
MCV: 87.8 fL (ref 78.0–100.0)
Monocytes Absolute: 0.4 10*3/uL (ref 0.1–1.0)
Monocytes Relative: 6 % (ref 3–12)
Neutro Abs: 4.5 10*3/uL (ref 1.7–7.7)
Neutrophils Relative %: 61 % (ref 43–77)
Platelets: 427 10*3/uL — ABNORMAL HIGH (ref 150–400)
RBC: 4.49 MIL/uL (ref 3.87–5.11)
RDW: 14.2 % (ref 11.5–15.5)
WBC: 7.3 10*3/uL (ref 4.0–10.5)

## 2014-08-07 LAB — IRON AND TIBC
Iron: 36 ug/dL (ref 28–170)
Saturation Ratios: 11 % (ref 10.4–31.8)
TIBC: 314 ug/dL (ref 250–450)
UIBC: 278 ug/dL

## 2014-08-07 LAB — COMPREHENSIVE METABOLIC PANEL
ALT: 10 U/L — ABNORMAL LOW (ref 14–54)
AST: 21 U/L (ref 15–41)
Albumin: 4.1 g/dL (ref 3.5–5.0)
Alkaline Phosphatase: 85 U/L (ref 38–126)
Anion gap: 5 (ref 5–15)
BUN: 17 mg/dL (ref 6–20)
CO2: 29 mmol/L (ref 22–32)
Calcium: 9.9 mg/dL (ref 8.9–10.3)
Chloride: 107 mmol/L (ref 101–111)
Creatinine, Ser: 0.68 mg/dL (ref 0.44–1.00)
GFR calc Af Amer: >60 mL/min (ref >60)
GFR calc non Af Amer: >60 mL/min (ref >60)
Glucose, Bld: 73 mg/dL (ref 65–99)
Potassium: 4.1 mmol/L (ref 3.5–5.1)
Sodium: 141 mmol/L (ref 135–145)
Total Bilirubin: 0.2 mg/dL — ABNORMAL LOW (ref 0.3–1.2)
Total Protein: 7.4 g/dL (ref 6.5–8.1)

## 2014-08-07 LAB — FERRITIN: Ferritin: 145 ng/mL (ref 11–307)

## 2014-08-07 MED ORDER — OXYCODONE HCL ER 20 MG PO T12A: 20.0000 mg | EXTENDED_RELEASE_TABLET | Freq: Two times a day (BID) | ORAL | Status: DC

## 2014-08-07 MED ORDER — DIAZEPAM 5 MG PO TABS: 5.0000 mg | ORAL_TABLET | Freq: Three times a day (TID) | ORAL | Status: DC | PRN

## 2014-08-07 NOTE — Patient Instructions (Addendum)
[facility] Discharge Instructions  RECOMMENDATIONS MADE BY THE CONSULTANT AND ANY TEST RESULTS WILL BE SENT TO YOUR REFERRING PHYSICIAN.  Exam and discussion by Dr. Whitney Muse. Will make referral to [facility] to see if they can assist with improving your flexibility and mobility. If they are not able to help you we will make a referral to a Pain Clinic.  Follow-up with labs and office visit in 4 months.   Thank you for choosing [facility] to provide your oncology and hematology care.  To afford each patient quality time with our provider, please arrive at least 15 minutes before your scheduled appointment time.    You need to re-schedule your appointment should you arrive 10 or more minutes late.  We strive to give you quality time with our providers, and arriving late affects you and other patients whose appointments are after yours.  Also, if you no show three or more times for appointments you may be dismissed from the clinic at the providers discretion.     Again, thank you for choosing [facility].  Our hope is that these requests will decrease the amount of time that you wait before being seen by our physicians.       _____________________________________________________________  Should you have questions after your visit to [facility], please contact our office at [phone] between the hours of 8:30 a.m. and 4:30 p.m.  Voicemails left after 4:30 p.m. will not be returned until the following business day.  For prescription refill requests, have your pharmacy contact our office.

## 2014-08-07 NOTE — Progress Notes (Signed)
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 On 01/06/2014 she was seen by infectious disease who recommended continuing doxycycline therapy.  Osteomyelitis lumbar spine   Diagnosis Bone, biopsy, lumbar three - LAMELLAR BONE WITH ASSOCIATED FIBROUS TISSUE AND INCREASED PLASMA CELLS. - THERE IS NO EVIDENCE OF MALIGNANCY. - SEE COMMENT. Microscopic Comment Pending immunohistochemical stains. Diagnosis Note There are scattered plasma cells (approximately 5-10%), however, no aggregates are seen. Light chain immunohistochemistry is technically unsatisfactory. Pancytokeratin is negative. Correlation with clinical, radiographic, and laboratory data (SPEP, etc.) is recommended. Vicente Males MD Pathologist, Electronic Signature (Case signed 07/18/2013)  Hypercalcemia mild, normal PTH  Microcytic anemia, Colonoscopy and EGD 11/2011. History of multiple polyps (tubular adenomas, serrated adenomas, hyperplastic polyps)  No evidence of an M spike, normal immunoglobulin levels, normal Kappa/Lambda ratio  Bone marrow biopsy and aspirate on 03/26/2014 showing no diagnostic morphologic evidence of plasma cell neoplasm. Normocellular to slightly hypercellular marrow particles with maturing trilineage hematopoiesis, and mild nonspecific dyserythroipoiesis. No significant iron stores  Iron deficiency anemia, colonoscopy in 2013 with polyps and internal hemorrhoids, on recall in 2018. EGD in 2013 with irregular Z line and stricture. Pathology without evidence of Barrett's  IV iron replacement 2/19, 2/26, 3/4   CURRENT THERAPY:  Observation  INTERVAL HISTORY: Autumn TORGESON 64 y.o. 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  She has osteomyelitis and continues on doxycycline daily. She feels some of her joint pain is better since receiving IV iron. She notices an improvement in her energy level. She realistically has few complaints today. Her mood is good.   She is here today with one family member.She continues to have back pain, Says pain is focused in hips and lower back; Says 'she can't live like this'.  Apetite is up and down but "she eats when she wants to" Last Mammogram, December 2015 Takes Oxycodone, 20 mg every night before bed during the week.  Has a hard time taking it every 12 hours, has a hard time staying awake at work if she does.  On weekend, she takes it at night and in the morning because she can lay down if she needs to. She also intermittently takes Ibuprofen, 844m, twice per day which she notes helps a lot.   MEDICAL HISTORY: Past Medical History  Diagnosis Date  . Diverticulosis   . Hemorrhoids   . Hx of adenomatous colonic polyps   . Hiatal hernia   . Hypertension   . Hyperlipidemia   . GERD (gastroesophageal reflux disease)   . Chronic kidney disease     kidney stone s/p stent placement ( removed)  . Complication of anesthesia   . PONV (postoperative nausea and vomiting)   . Bronchitis, chronic   . Chronic back pain   . Lumbar radicular pain   . Compression fracture of L3 lumbar vertebra   . T7 vertebral fracture   . Smoldering myeloma   . Sore throat     has Benign neoplasm of colon; Esophageal reflux; Other dysphagia; Lumbar compression fracture; Compression fracture; Hypercalcemia; Hypokalemia; Sepsis; Epidural abscess, L2-L5; Right hip pain; Plasmacytoma of bone; Other iron deficiency anemias; Osteomyelitis of  lumbar spine; Hypertension; Chronic back pain; Acute low back pain; Epidural abscess; Essential hypertension; and MRSA infection on her problem list.     No  history exists.     is allergic to morphine and related and tetracyclines & related.  Ms. Burgueno had no medications administered during this visit.  SURGICAL HISTORY: Past Surgical History  Procedure Laterality Date  . Cholecystectomy    . Esophagogastroduodenoscopy  12/18/2011    Procedure: ESOPHAGOGASTRODUODENOSCOPY (EGD);  Surgeon: Lafayette Dragon, MD;  Location: [facility];  Service: Endoscopy;  Laterality: N/A;  . Colonoscopy  12/18/2011    Procedure: COLONOSCOPY;  Surgeon: Lafayette Dragon, MD;  Location: [facility];  Service: Endoscopy;  Laterality: N/A;  . Ureteral stent placement    . Vertebroplasty N/A 07/14/2013    Procedure: VERTEBROPLASTY WITH LUMBAR THREE BIOPSY;  Surgeon: Charlie Pitter, MD;  Location: [facility];  Service: Neurosurgery;  Laterality: N/A;  VERTEBROPLASTY WITH LUMBAR THREE BIOPSY  . Back surgery    . Lumbar laminectomy for epidural abscess Left 09/22/2013    Procedure: LUMBAR LAMINECTOMY FOR EPIDURAL ABSCESS LEFT LUMBAR FIVE-SACRAL ONE;  Surgeon: Charlie Pitter, MD;  Location: [facility];  Service: Neurosurgery;  Laterality: Left;  . Bone marrow biopsy Left 02/2014  . Bone marrow aspiration Left 02/2014  . Radiology with anesthesia N/A 08/13/2014    Procedure: RADIOLOGY WITH ANESTHESIA ;  Surgeon: Medication Radiologist, MD;  Location: [place] [facility];  Service: Radiology;  Laterality: N/A;    SOCIAL HISTORY: History   Social History  . Marital Status: Single    Spouse Name: N/A  . Number of Children: N/A  . Years of Education: N/A   Occupational History  . Clerk     [employer]   Social History Main Topics  . Smoking status: Never Smoker   . Smokeless tobacco: Never Used  . Alcohol Use: Yes     Comment: social occ  . Drug Use: No  . Sexual Activity: Not on file   Other Topics Concern  . Not on file   Social History Narrative    FAMILY HISTORY: Family History  Problem Relation Age of Onset  . Breast cancer Sister 37  . Breast cancer  Other 53    aunt    Review of Systems  Constitutional: Negative for malaise/fatigue. Negative for fever, chills and weight loss.  HENT: Negative for congestion, hearing loss, nosebleeds, sore throat and tinnitus.   Eyes: Negative for blurred vision, double vision, pain and discharge.  Respiratory: Negative for cough, hemoptysis, sputum production, shortness of breath and wheezing.   Cardiovascular: Negative for chest pain, palpitations, claudication, leg swelling and PND.  Gastrointestinal: Negative for heartburn, nausea, vomiting, abdominal pain, diarrhea, constipation, blood in stool and melena.  Genitourinary: Negative for dysuria, urgency, frequency and hematuria.  Musculoskeletal: Positive for back pain and joint pain. Negative for myalgias and falls.  Skin: Negative for itching and rash.  Neurological: Negative for dizziness, tingling, tremors, sensory change, speech change, focal weakness, seizures, loss of consciousness, weakness and headaches.  Endo/Heme/Allergies: Does not bruise/bleed easily.  Psychiatric/Behavioral: Negative for depression, suicidal ideas, memory loss and substance abuse. The patient is not nervous/anxious and does not have insomnia.   14 point review of systems was performed and is negative except as detailed under history of present illness and above   PHYSICAL EXAMINATION  ECOG PERFORMANCE STATUS: 1  Filed Vitals:   08/07/14 1300  BP: 121/59  Pulse: 56  Temp: 97.8 F (36.6  C)  Resp: 16    Physical Exam  Constitutional: She is oriented to person, place, and time and well-developed, well-nourished, and in no distress.  HENT:  Head: Normocephalic and atraumatic.  Nose: Nose normal.  Mouth/Throat: Oropharynx is clear and moist. No oropharyngeal exudate.  Eyes: Conjunctivae and EOM are normal. Pupils are equal, round, and reactive to light. Right eye exhibits no discharge. Left eye exhibits no discharge. No scleral icterus.  Neck: Normal range of  motion. Neck supple. No tracheal deviation present. No thyromegaly present.  Cardiovascular: Normal rate, regular rhythm and normal heart sounds.  Exam reveals no gallop and no friction rub.   No murmur heard. Pulmonary/Chest: Effort normal and breath sounds normal. She has no wheezes. She has no rales.  Abdominal: Soft. Bowel sounds are normal. She exhibits no distension and no mass. There is no tenderness. There is no rebound and no guarding.  Musculoskeletal: She exhibits no edema.  Positive for limited range of motion, cannot bend down and touch toes Lymphadenopathy:    She has no cervical adenopathy.  Neurological: She is alert and oriented to person, place, and time. She has normal reflexes. No cranial nerve deficit. Gait normal. Coordination normal.  Skin: Skin is warm and dry. No rash noted.  Psychiatric: Mood, memory, affect and judgment normal.  Nursing note and vitals reviewed.   LABORATORY DATA: Results for DERYN, MASSENGALE (MRN [id number])   Ref. Range 08/07/2014 14:50 08/07/2014 14:50  Total Protein ELP Latest Ref Range: 6.0-8.5 g/dL  6.7  Albumin ELP Latest Ref Range: 2.9-4.4 g/dL  3.5  Globulin, Total Latest Ref Range: 2.0-4.5 g/dL  3.2  Alpha-1-Globulin Latest Ref Range: 0.1-0.4 g/dL  0.3  Alpha-2-Globulin Latest Ref Range: 0.4-1.2 g/dL  0.8  Beta Globulin Latest Ref Range: 0.6-1.3 g/dL  0.9  Gamma Globulin Latest Ref Range: 0.5-1.6 g/dL  1.2  M-SPIKE, % Latest Ref Range: Not Observed g/dL  Not Observed  SPE Interp. Unknown  Comment  Comment Unknown  Comment  IgG (Immunoglobin G), Serum Latest Ref Range: [phone] mg/dL 1281   IgA Latest Ref Range: 87-352 mg/dL 210   IgM, Serum Latest Ref Range: 26-217 mg/dL 103   Kappa free light chain Latest Ref Range: 3.30-19.40 mg/L 25.93 (H)   Lamda free light chains Latest Ref Range: 5.71-26.30 mg/L 23.24   Kappa, lamda light chain ratio Latest Ref Range: 0.26-1.65  1.12   WBC Latest Ref Range: 4.0-10.5 K/uL 7.3   RBC Latest Ref  Range: 3.87-5.11 MIL/uL 4.49   Hemoglobin Latest Ref Range: 12.0-15.0 g/dL 12.3   HCT Latest Ref Range: 36.0-46.0 % 39.4   MCV Latest Ref Range: 78.0-100.0 fL 87.8   MCH Latest Ref Range: 26.0-34.0 pg 27.4   MCHC Latest Ref Range: 30.0-36.0 g/dL 31.2   RDW Latest Ref Range: 11.5-15.5 % 14.2   Platelets Latest Ref Range: 150-400 K/uL 427 (H)   Neutrophils Latest Ref Range: 43-77 % 61   Lymphocytes Latest Ref Range: 12-46 % 30   Monocytes Relative Latest Ref Range: 3-12 % 6   Eosinophil Latest Ref Range: 0-5 % 2   Basophil Latest Ref Range: 0-1 % 1   NEUT# Latest Ref Range: 1.7-7.7 K/uL 4.5   Lymphocyte # Latest Ref Range: 0.7-4.0 K/uL 2.2   Monocyte # Latest Ref Range: 0.1-1.0 K/uL 0.4   Eosinophils Absolute Latest Ref Range: 0.0-0.7 K/uL 0.2   Basophils Absolute Latest Ref Range: 0.0-0.1 K/uL 0.1     ASSESSMENT and THERAPY PLAN:   Plasmacytoma  She received radiation to the lumbar spine. Bone marrow biopsy has been performed showed no evidence of a plasma cell neoplasm. Bone marrow biopsy has normal cytogeneticsKappa lambda light chain ratio has been normal, SPEP/IEP has been normal. I recommended ongoing observation and monitoring of laboratory studies. We will see her back in 3 months.  Iron deficiency anemia  Colonoscopy was performed in October 2013. 8 polyps were removed. Pathology was consistent with tubular adenomas, hyperplastic polyps, and 3 serrated adenomas. No dysplasia or malignancy was found. She is due for recall in 2018. We will check iron levels again in 3 months.  Chronic Back Pain  I offered to refer her to PT. In addition if her pain does not improve we could consider referral to pain medicine.  She continues to follow with ID. She is willing to try PT.  All questions were answered. The patient knows to call the clinic with any problems, questions or concerns. We can certainly see the patient much sooner if necessary.   This note was electronically signed  by:  This document serves as a record of services personally performed by Ancil Linsey, MD. It was created on her behalf by Janace Hoard, a trained medical scribe. The creation of this record is based on the scribe's personal observations and the provider's statements to them. This document has been checked and approved by the attending provider.  I have reviewed the above documentation for accuracy and completeness, and I agree with the above.  Kelby Fam. Penland, MD 08/16/2014

## 2014-08-07 NOTE — Progress Notes (Signed)
Autumn Duran presented for labwork. Labs per MD order drawn via Peripheral Line 23 gauge needle inserted in left AC  Good blood return present. Procedure without incident.  Needle removed intact. Patient tolerated procedure well.

## 2014-08-08 LAB — IGG, IGA, IGM
IgA: 210 mg/dL (ref 87–352)
IgG (Immunoglobin G), Serum: 1281 mg/dL (ref 700–1600)
IgM, Serum: 103 mg/dL (ref 26–217)

## 2014-08-09 LAB — KAPPA/LAMBDA LIGHT CHAINS
Kappa free light chain: 25.93 mg/L — ABNORMAL HIGH (ref 3.30–19.40)
Kappa, lambda light chain ratio: 1.12 (ref 0.26–1.65)
Lambda free light chains: 23.24 mg/L (ref 5.71–26.30)

## 2014-08-10 LAB — IMMUNOFIXATION ELECTROPHORESIS
IgA: 224 mg/dL (ref 87–352)
IgG (Immunoglobin G), Serum: 1306 mg/dL (ref 700–1600)
IgM, Serum: 105 mg/dL (ref 26–217)
Total Protein ELP: 6.9 g/dL (ref 6.0–8.5)

## 2014-08-10 LAB — PROTEIN ELECTROPHORESIS, SERUM
A/G Ratio: 1.1 (ref 0.7–2.0)
Albumin ELP: 3.5 g/dL (ref 2.9–4.4)
Alpha-1-Globulin: 0.3 g/dL (ref 0.1–0.4)
Alpha-2-Globulin: 0.8 g/dL (ref 0.4–1.2)
Beta Globulin: 0.9 g/dL (ref 0.6–1.3)
Gamma Globulin: 1.2 g/dL (ref 0.5–1.6)
Globulin, Total: 3.2 g/dL (ref 2.0–4.5)
Total Protein ELP: 6.7 g/dL (ref 6.0–8.5)

## 2014-08-11 ENCOUNTER — Encounter: Payer: BLUE CROSS/BLUE SHIELD | Admitting: Internal Medicine

## 2014-08-12 ENCOUNTER — Telehealth (HOSPITAL_COMMUNITY): Payer: Self-pay | Admitting: *Deleted

## 2014-08-12 NOTE — Telephone Encounter (Signed)
Patient called to report that she has had severe pain in legs and hips starting last night. She rated it as a 10 last night. Stayed out of work today and took Oxycontin/motrin and finally 2 oxycodone and rates it a 7 now. She questioned if anything showed cause for this in her labwork. I explained to her that labs were unremarkable, however will review with Dr. Whitney Muse and call her back.

## 2014-08-13 ENCOUNTER — Emergency Department (HOSPITAL_COMMUNITY): Payer: BLUE CROSS/BLUE SHIELD

## 2014-08-13 ENCOUNTER — Encounter (HOSPITAL_COMMUNITY)
Admission: EM | Disposition: A | Discharge status: Home Health | Payer: Self-pay | Source: Home / Self Care | Attending: Internal Medicine

## 2014-08-13 ENCOUNTER — Emergency Department (HOSPITAL_COMMUNITY): Payer: BLUE CROSS/BLUE SHIELD | Admitting: Anesthesiology

## 2014-08-13 ENCOUNTER — Encounter (HOSPITAL_COMMUNITY): Payer: Self-pay

## 2014-08-13 ENCOUNTER — Inpatient Hospital Stay (HOSPITAL_COMMUNITY)
Admission: EM | Admit: 2014-08-13 | Discharge: 2014-08-18 | DRG: 095 | Disposition: A | Discharge status: Home Health | Payer: BLUE CROSS/BLUE SHIELD | Attending: Internal Medicine | Admitting: Internal Medicine

## 2014-08-13 DIAGNOSIS — C9000 Multiple myeloma not having achieved remission: Secondary | ICD-10-CM | POA: Diagnosis present

## 2014-08-13 DIAGNOSIS — D539 Nutritional anemia, unspecified: Secondary | ICD-10-CM | POA: Diagnosis present

## 2014-08-13 DIAGNOSIS — Z9221 Personal history of antineoplastic chemotherapy: Secondary | ICD-10-CM | POA: Diagnosis not present

## 2014-08-13 DIAGNOSIS — E876 Hypokalemia: Secondary | ICD-10-CM | POA: Diagnosis present

## 2014-08-13 DIAGNOSIS — N189 Chronic kidney disease, unspecified: Secondary | ICD-10-CM | POA: Diagnosis present

## 2014-08-13 DIAGNOSIS — R188 Other ascites: Secondary | ICD-10-CM | POA: Diagnosis present

## 2014-08-13 DIAGNOSIS — G061 Intraspinal abscess and granuloma: Secondary | ICD-10-CM | POA: Diagnosis present

## 2014-08-13 DIAGNOSIS — I129 Hypertensive chronic kidney disease with stage 1 through stage 4 chronic kidney disease, or unspecified chronic kidney disease: Secondary | ICD-10-CM | POA: Diagnosis present

## 2014-08-13 DIAGNOSIS — M4626 Osteomyelitis of vertebra, lumbar region: Secondary | ICD-10-CM | POA: Diagnosis present

## 2014-08-13 DIAGNOSIS — A4902 Methicillin resistant Staphylococcus aureus infection, unspecified site: Secondary | ICD-10-CM | POA: Insufficient documentation

## 2014-08-13 DIAGNOSIS — B9562 Methicillin resistant Staphylococcus aureus infection as the cause of diseases classified elsewhere: Secondary | ICD-10-CM | POA: Diagnosis present

## 2014-08-13 DIAGNOSIS — K5900 Constipation, unspecified: Secondary | ICD-10-CM | POA: Diagnosis present

## 2014-08-13 DIAGNOSIS — M545 Low back pain, unspecified: Secondary | ICD-10-CM | POA: Diagnosis present

## 2014-08-13 DIAGNOSIS — Z923 Personal history of irradiation: Secondary | ICD-10-CM | POA: Diagnosis not present

## 2014-08-13 DIAGNOSIS — G062 Extradural and subdural abscess, unspecified: Secondary | ICD-10-CM | POA: Diagnosis present

## 2014-08-13 DIAGNOSIS — M609 Myositis, unspecified: Secondary | ICD-10-CM | POA: Diagnosis present

## 2014-08-13 DIAGNOSIS — Z8614 Personal history of Methicillin resistant Staphylococcus aureus infection: Secondary | ICD-10-CM

## 2014-08-13 DIAGNOSIS — M4646 Discitis, unspecified, lumbar region: Secondary | ICD-10-CM | POA: Diagnosis present

## 2014-08-13 DIAGNOSIS — I10 Essential (primary) hypertension: Secondary | ICD-10-CM | POA: Diagnosis present

## 2014-08-13 DIAGNOSIS — Z8661 Personal history of infections of the central nervous system: Secondary | ICD-10-CM

## 2014-08-13 DIAGNOSIS — E785 Hyperlipidemia, unspecified: Secondary | ICD-10-CM | POA: Diagnosis present

## 2014-08-13 DIAGNOSIS — K6812 Psoas muscle abscess: Secondary | ICD-10-CM | POA: Diagnosis not present

## 2014-08-13 DIAGNOSIS — K219 Gastro-esophageal reflux disease without esophagitis: Secondary | ICD-10-CM | POA: Diagnosis present

## 2014-08-13 DIAGNOSIS — M549 Dorsalgia, unspecified: Secondary | ICD-10-CM | POA: Diagnosis not present

## 2014-08-13 DIAGNOSIS — G8929 Other chronic pain: Secondary | ICD-10-CM | POA: Diagnosis present

## 2014-08-13 DIAGNOSIS — C9030 Solitary plasmacytoma not having achieved remission: Secondary | ICD-10-CM

## 2014-08-13 DIAGNOSIS — Z79891 Long term (current) use of opiate analgesic: Secondary | ICD-10-CM

## 2014-08-13 HISTORY — PX: RADIOLOGY WITH ANESTHESIA: SHX6223

## 2014-08-13 LAB — CBC WITH DIFFERENTIAL/PLATELET
Basophils Absolute: 0 10*3/uL (ref 0.0–0.1)
Basophils Relative: 0 % (ref 0–1)
Eosinophils Absolute: 0 10*3/uL (ref 0.0–0.7)
Eosinophils Relative: 0 % (ref 0–5)
HCT: 36.7 % (ref 36.0–46.0)
Hemoglobin: 12.1 g/dL (ref 12.0–15.0)
Lymphocytes Relative: 9 % — ABNORMAL LOW (ref 12–46)
Lymphs Abs: 1 10*3/uL (ref 0.7–4.0)
MCH: 27.8 pg (ref 26.0–34.0)
MCHC: 33 g/dL (ref 30.0–36.0)
MCV: 84.2 fL (ref 78.0–100.0)
Monocytes Absolute: 0.2 10*3/uL (ref 0.1–1.0)
Monocytes Relative: 2 % — ABNORMAL LOW (ref 3–12)
Neutro Abs: 9.1 10*3/uL — ABNORMAL HIGH (ref 1.7–7.7)
Neutrophils Relative %: 89 % — ABNORMAL HIGH (ref 43–77)
Platelets: 339 10*3/uL (ref 150–400)
RBC: 4.36 MIL/uL (ref 3.87–5.11)
RDW: 13.7 % (ref 11.5–15.5)
WBC: 10.3 10*3/uL (ref 4.0–10.5)

## 2014-08-13 LAB — URINALYSIS, ROUTINE W REFLEX MICROSCOPIC
Bilirubin Urine: NEGATIVE
Glucose, UA: NEGATIVE mg/dL
Hgb urine dipstick: NEGATIVE
Ketones, ur: >80 mg/dL — AB
Nitrite: NEGATIVE
Protein, ur: NEGATIVE mg/dL
Specific Gravity, Urine: 1.017 (ref 1.005–1.030)
Urobilinogen, UA: 0.2 mg/dL (ref 0.0–1.0)
pH: 6.5 (ref 5.0–8.0)

## 2014-08-13 LAB — BASIC METABOLIC PANEL
Anion gap: 12 (ref 5–15)
BUN: 14 mg/dL (ref 6–20)
CO2: 25 mmol/L (ref 22–32)
Calcium: 10.1 mg/dL (ref 8.9–10.3)
Chloride: 99 mmol/L — ABNORMAL LOW (ref 101–111)
Creatinine, Ser: 0.7 mg/dL (ref 0.44–1.00)
GFR calc Af Amer: >60 mL/min (ref >60)
GFR calc non Af Amer: >60 mL/min (ref >60)
Glucose, Bld: 126 mg/dL — ABNORMAL HIGH (ref 65–99)
Potassium: 4.3 mmol/L (ref 3.5–5.1)
Sodium: 136 mmol/L (ref 135–145)

## 2014-08-13 LAB — MRSA PCR SCREENING: MRSA by PCR: POSITIVE — AB

## 2014-08-13 LAB — URINE MICROSCOPIC-ADD ON

## 2014-08-13 SURGERY — RADIOLOGY WITH ANESTHESIA
Anesthesia: General

## 2014-08-13 MED ORDER — HYDROMORPHONE HCL 1 MG/ML IJ SOLN
INTRAMUSCULAR | Status: AC
  Filled 2014-08-13: qty 1

## 2014-08-13 MED ORDER — OXYCODONE-ACETAMINOPHEN 5-325 MG PO TABS: 1.0000 | ORAL_TABLET | Freq: Once | ORAL | Status: DC

## 2014-08-13 MED ORDER — FENTANYL CITRATE (PF) 100 MCG/2ML IJ SOLN
INTRAMUSCULAR | Status: DC
Start: 2014-08-13 — End: 2014-08-14
  Filled 2014-08-13: qty 2

## 2014-08-13 MED ORDER — FENTANYL CITRATE (PF) 100 MCG/2ML IJ SOLN: 25.0000 ug | INTRAMUSCULAR | Status: DC | PRN

## 2014-08-13 MED ORDER — SODIUM CHLORIDE 0.9 % IN NEBU
INHALATION_SOLUTION | RESPIRATORY_TRACT | Status: AC
  Filled 2014-08-13: qty 3

## 2014-08-13 MED ORDER — ONDANSETRON HCL 4 MG/2ML IJ SOLN
4.0000 mg | Freq: Four times a day (QID) | INTRAMUSCULAR | Status: DC | PRN
  Administered 2014-08-14 (×4): 4 mg via INTRAVENOUS
  Filled 2014-08-13 (×4): qty 2

## 2014-08-13 MED ORDER — SODIUM CHLORIDE 0.9 % IV BOLUS (SEPSIS)
1000.0000 mL | Freq: Once | INTRAVENOUS | Status: AC
Start: 2014-08-13 — End: 2014-08-13
  Administered 2014-08-13: 1000 mL via INTRAVENOUS

## 2014-08-13 MED ORDER — HYDROMORPHONE HCL 1 MG/ML IJ SOLN: 0.2500 mg | INTRAMUSCULAR | Status: DC | PRN

## 2014-08-13 MED ORDER — ONDANSETRON HCL 4 MG/2ML IJ SOLN
4.0000 mg | Freq: Once | INTRAMUSCULAR | Status: AC
  Administered 2014-08-13: 4 mg via INTRAVENOUS
  Filled 2014-08-13: qty 2

## 2014-08-13 MED ORDER — HYDROMORPHONE HCL 1 MG/ML IJ SOLN
1.0000 mg | INTRAMUSCULAR | Status: DC | PRN
  Administered 2014-08-14 – 2014-08-18 (×10): 1 mg via INTRAVENOUS
  Filled 2014-08-13 (×11): qty 1

## 2014-08-13 MED ORDER — OXYCODONE HCL 5 MG PO TABS: 5.0000 mg | ORAL_TABLET | Freq: Once | ORAL | Status: DC | PRN

## 2014-08-13 MED ORDER — SENNA 8.6 MG PO TABS
1.0000 | ORAL_TABLET | ORAL | Status: DC | PRN
Start: 2014-08-13 — End: 2014-08-16
  Administered 2014-08-14 – 2014-08-15 (×2): 8.6 mg via ORAL
  Filled 2014-08-13 (×4): qty 1

## 2014-08-13 MED ORDER — SODIUM CHLORIDE 0.9 % IJ SOLN
INTRAMUSCULAR | Status: AC
  Filled 2014-08-13: qty 10

## 2014-08-13 MED ORDER — PIPERACILLIN-TAZOBACTAM 3.375 G IVPB 30 MIN
3.3750 g | Freq: Once | INTRAVENOUS | Status: AC
  Administered 2014-08-13: 3.375 g via INTRAVENOUS
  Filled 2014-08-13: qty 50

## 2014-08-13 MED ORDER — OXYCODONE HCL 5 MG PO TABS
20.0000 mg | ORAL_TABLET | Freq: Once | ORAL | Status: AC
  Administered 2014-08-13: 20 mg via ORAL
  Filled 2014-08-13: qty 4

## 2014-08-13 MED ORDER — LACTATED RINGERS IV SOLN
INTRAVENOUS | Status: DC
  Administered 2014-08-13: 13:00:00 via INTRAVENOUS

## 2014-08-13 MED ORDER — HYDROMORPHONE HCL 1 MG/ML IJ SOLN
1.0000 mg | Freq: Once | INTRAMUSCULAR | Status: AC
  Administered 2014-08-13: 1 mg via INTRAVENOUS
  Filled 2014-08-13: qty 1

## 2014-08-13 MED ORDER — LIDOCAINE HCL 1 % IJ SOLN
INTRAMUSCULAR | Status: AC
  Filled 2014-08-13: qty 20

## 2014-08-13 MED ORDER — HYDROMORPHONE HCL 1 MG/ML IJ SOLN
1.0000 mg | Freq: Once | INTRAMUSCULAR | Status: AC
Start: 2014-08-13 — End: 2014-08-13
  Administered 2014-08-13: 1 mg via INTRAVENOUS
  Filled 2014-08-13: qty 1

## 2014-08-13 MED ORDER — HYDROMORPHONE HCL 1 MG/ML IJ SOLN
INTRAMUSCULAR | Status: AC
  Filled 2014-08-13: qty 2

## 2014-08-13 MED ORDER — OXYCODONE HCL 5 MG/5ML PO SOLN: 5.0000 mg | Freq: Once | ORAL | Status: DC | PRN

## 2014-08-13 MED ORDER — HYDROMORPHONE HCL 1 MG/ML IJ SOLN
1.0000 mg | Freq: Once | INTRAMUSCULAR | Status: AC
  Administered 2014-08-13: 1 mg via INTRAVENOUS

## 2014-08-13 MED ORDER — MIDAZOLAM HCL 2 MG/2ML IJ SOLN
INTRAMUSCULAR | Status: AC
  Filled 2014-08-13: qty 2

## 2014-08-13 MED ORDER — HYDROMORPHONE HCL 1 MG/ML IJ SOLN
1.0000 mg | INTRAMUSCULAR | Status: DC | PRN
  Filled 2014-08-13: qty 1

## 2014-08-13 MED ORDER — ONDANSETRON HCL 4 MG PO TABS: 4.0000 mg | ORAL_TABLET | Freq: Four times a day (QID) | ORAL | Status: DC | PRN

## 2014-08-13 MED ORDER — VANCOMYCIN HCL 10 G IV SOLR
1500.0000 mg | Freq: Once | INTRAVENOUS | Status: AC
  Administered 2014-08-13: 1500 mg via INTRAVENOUS
  Filled 2014-08-13: qty 1500

## 2014-08-13 MED ORDER — MEPERIDINE HCL 25 MG/ML IJ SOLN: 6.2500 mg | INTRAMUSCULAR | Status: DC | PRN

## 2014-08-13 MED ORDER — OXYCODONE HCL ER 10 MG PO T12A
20.0000 mg | EXTENDED_RELEASE_TABLET | Freq: Two times a day (BID) | ORAL | Status: DC
  Administered 2014-08-13 – 2014-08-18 (×10): 20 mg via ORAL
  Filled 2014-08-13 (×10): qty 2

## 2014-08-13 MED ORDER — PROMETHAZINE HCL 25 MG/ML IJ SOLN: 6.2500 mg | INTRAMUSCULAR | Status: DC | PRN

## 2014-08-13 MED ORDER — DIAZEPAM 5 MG PO TABS
5.0000 mg | ORAL_TABLET | Freq: Three times a day (TID) | ORAL | Status: DC | PRN
  Administered 2014-08-14 – 2014-08-16 (×4): 5 mg via ORAL
  Filled 2014-08-13 (×5): qty 1

## 2014-08-13 MED ORDER — GADOBENATE DIMEGLUMINE 529 MG/ML IV SOLN
15.0000 mL | Freq: Once | INTRAVENOUS | Status: AC | PRN
Start: 2014-08-13 — End: 2014-08-13
  Administered 2014-08-13: 15 mL via INTRAVENOUS

## 2014-08-13 MED ORDER — TOBRAMYCIN-DEXAMETHASONE 0.3-0.1 % OP SUSP
1.0000 [drp] | OPHTHALMIC | Status: DC
  Administered 2014-08-13 – 2014-08-18 (×18): 1 [drp] via OPHTHALMIC
  Filled 2014-08-13: qty 2.5

## 2014-08-13 MED ORDER — PANTOPRAZOLE SODIUM 40 MG PO TBEC
40.0000 mg | DELAYED_RELEASE_TABLET | Freq: Every day | ORAL | Status: DC
  Administered 2014-08-13 – 2014-08-18 (×6): 40 mg via ORAL
  Filled 2014-08-13 (×6): qty 1

## 2014-08-13 MED ORDER — SODIUM CHLORIDE 0.9 % IV SOLN
INTRAVENOUS | Status: AC
  Administered 2014-08-13: 22:00:00 via INTRAVENOUS

## 2014-08-13 NOTE — Progress Notes (Signed)
Dr Zigmund Daniel (radiology) has updated Dr Colin Rhein re MRI results. Pt transported back to ED

## 2014-08-13 NOTE — ED Provider Notes (Signed)
Assumed care of pt from Dr. Regenia Skeeter.  Briefly, pt is a 64 y.o. female presenting with recurrent back pain.  MRI today revealed recurrent epidural abscess with OM.  Vanc/zosyn given.  Consulted NSU and IM.  NSU recommended abx and blood cultures.  These were given.  Consulted IM for admission.  They requested ID evaluation.  ID recommended no further abx and eventual aspiration by IR.  Admitted in stable condition  Debby Freiberg, MD 08/13/14 1900

## 2014-08-13 NOTE — H&P (Signed)
PCP:   [name], MD   Chief Complaint:  Lower back pain  HPI: 64 yo female h/o htn, ckd, MRSA epidural absess and osteomyelitis of L spine treated with iv vanco than transitioned to oral doxycycline for about 3 months per patient report.  Has been following with ID as outpatient.  Was taken off doxycycline several months ago, but her back pain returned more severe she put herself back on the doxy for a couple of weeks (over a month ago).  Pt has had severe lower back pain over the last week,not controlled with her home pain meds.  Denies any fevers.  No n/t/or weakness in her legs but unable to walk due to pain.  Denies any saddle anesthesia or urinary/bowel incontinence.  Repeat mri today shows epidural absess and progression of her osteo c/w previous mri.   Review of Systems:  Positive and negative as per HPI otherwise all other systems are negative  Past Medical History: Past Medical History  Diagnosis Date  . Diverticulosis   . Hemorrhoids   . Hx of adenomatous colonic polyps   . Hiatal hernia   . Hypertension   . Hyperlipidemia   . GERD (gastroesophageal reflux disease)   . Chronic kidney disease     kidney stone s/p stent placement ( removed)  . Complication of anesthesia   . PONV (postoperative nausea and vomiting)   . Bronchitis, chronic   . Chronic back pain   . Lumbar radicular pain   . Compression fracture of L3 lumbar vertebra   . T7 vertebral fracture   . Smoldering myeloma   . Sore throat    Past Surgical History  Procedure Laterality Date  . Cholecystectomy    . Esophagogastroduodenoscopy  12/18/2011    Procedure: ESOPHAGOGASTRODUODENOSCOPY (EGD);  Surgeon: Lafayette Dragon, MD;  Location: [facility];  Service: Endoscopy;  Laterality: N/A;  . Colonoscopy  12/18/2011    Procedure: COLONOSCOPY;  Surgeon: Lafayette Dragon, MD;  Location: [facility];  Service: Endoscopy;  Laterality: N/A;  . Ureteral stent placement    . Vertebroplasty N/A 07/14/2013     Procedure: VERTEBROPLASTY WITH LUMBAR THREE BIOPSY;  Surgeon: Charlie Pitter, MD;  Location: [place] [facility];  Service: Neurosurgery;  Laterality: N/A;  VERTEBROPLASTY WITH LUMBAR THREE BIOPSY  . Back surgery    . Lumbar laminectomy for epidural abscess Left 09/22/2013    Procedure: LUMBAR LAMINECTOMY FOR EPIDURAL ABSCESS LEFT LUMBAR FIVE-SACRAL ONE;  Surgeon: Charlie Pitter, MD;  Location: [place] [facility];  Service: Neurosurgery;  Laterality: Left;  . Bone marrow biopsy Left 02/2014  . Bone marrow aspiration Left 02/2014    Medications: Prior to Admission medications   Medication Sig Start Date End Date Taking? Authorizing Provider  diazepam (VALIUM) 5 MG tablet Take 1 tablet (5 mg total) by mouth every 8 (eight) hours as needed for anxiety. 08/07/14  Yes Patrici Ranks, MD  furosemide (LASIX) 40 MG tablet Take 40 mg by mouth daily as needed for fluid.    Yes Historical Provider, MD  HYDROcodone-acetaminophen (NORCO) 10-325 MG per tablet Take 1 or 2 tablets every 4 hours to control pain 07/15/14  Yes Manon Hilding Kefalas, PA-C  ibuprofen (ADVIL,MOTRIN) 800 MG tablet Take 1 tablet (800 mg total) by mouth every 8 (eight) hours as needed for mild pain. 06/24/14  Yes Baird Cancer, PA-C  magnesium hydroxide (MILK OF MAGNESIA) 400 MG/5ML suspension Take 30 mLs by mouth daily as needed for mild constipation.   Yes  Historical Provider, MD  omeprazole (PRILOSEC) 40 MG capsule Take 40 mg by mouth daily as needed (acid reflux).  08/09/13  Yes Historical Provider, MD  ondansetron (ZOFRAN) 4 MG tablet Take 1 tablet (4 mg total) by mouth every 8 (eight) hours as needed for nausea or vomiting. 03/19/14  Yes Patrici Ranks, MD  OxyCODONE (OXYCONTIN) 20 mg T12A 12 hr tablet Take 1 tablet (20 mg total) by mouth every 12 (twelve) hours. 08/07/14  Yes Patrici Ranks, MD  potassium chloride SA (K-DUR,KLOR-CON) 20 MEQ tablet Take 20 mEq by mouth daily as needed (supplement).  01/29/14  Yes Historical Provider, MD  RABEprazole  (ACIPHEX) 20 MG tablet Take 1 tablet (20 mg total) by mouth daily. 05/08/14  Yes Patrici Ranks, MD  senna (SENOKOT) 8.6 MG tablet Take 1 tablet by mouth as needed.    Yes Historical Provider, MD  tobramycin-dexamethasone [facility]) ophthalmic solution Place 1 drop into the left eye every 4 (four) hours while awake.  01/07/14  Yes Historical Provider, MD    Allergies:   Allergies  Allergen Reactions  . Morphine And Related Other (See Comments)    Headache  . Tetracyclines & Related Nausea And Vomiting    Social History:  reports that she has never smoked. She has never used smokeless tobacco. She reports that she drinks alcohol. She reports that she does not use illicit drugs.  Family History: Family History  Problem Relation Age of Onset  . Breast cancer Sister 73  . Breast cancer Other 76    aunt    Physical Exam: Filed Vitals:   08/13/14 1800 08/13/14 1815 08/13/14 1830 08/13/14 1845  BP: 110/51 120/53 110/55 121/57  Pulse:  81 82 88  Temp:      TempSrc:      Resp: _0 SpO2:  96% 94% 97%   General appearance: alert, cooperative and no distress Head: Normocephalic, without obvious abnormality, atraumatic Eyes: negative Nose: Nares normal. Septum midline. Mucosa normal. No drainage or sinus tenderness. Neck: no JVD and supple, symmetrical, trachea midline Lungs: clear to auscultation bilaterally Heart: regular rate and rhythm, S1, S2 normal, no murmur, click, rub or gallop Abdomen: soft, non-tender; bowel sounds normal; no masses,  no organomegaly Extremities: extremities normal, atraumatic, no cyanosis or edema Pulses: 2+ and symmetric Skin: Skin color, texture, turgor normal. No rashes or lesions Neurologic: Grossly normal   Labs on Admission:   Recent Labs  08/13/14 0804  NA 136  K 4.3  CL 99*  CO2 25  GLUCOSE 126*  BUN 14  CREATININE 0.70  CALCIUM 10.1    Recent Labs  08/13/14 0804  WBC 10.3  NEUTROABS 9.1*  HGB 12.1  HCT 36.7   MCV 84.2  PLT 339    Radiological Exams on Admission: Ct Head Wo Contrast  08/13/2014   CLINICAL DATA:  Headache with nausea vomiting 2 days  EXAM: CT HEAD WITHOUT CONTRAST  TECHNIQUE: Contiguous axial images were obtained from the base of the skull through the vertex without intravenous contrast.  COMPARISON:  None.  FINDINGS: Ventricle size normal. Negative for acute or chronic infarction. Negative for hemorrhage or mass. No edema or shift of the midline structures  Chronic sinusitis with mucosal edema in the paranasal sinuses and associated bony thickening of the maxillary sinus bilaterally. Air-fluid level left maxillary sinus. No skull lesion.  IMPRESSION: Negative CT of the head  Chronic sinusitis.   Electronically Signed   By: Franchot Gallo M.D.  On: 08/13/2014 09:10   Mr Thoracic Spine W Wo Contrast  08/13/2014   CLINICAL DATA:  Chronic progressive back pain. History of epidural abscess. Progressive headache. Bilateral leg pain.  EXAM: MRI THORACIC AND LUMBAR SPINE WITHOUT CONTRAST  TECHNIQUE: Multiplanar and multiecho pulse sequences of the thoracic and lumbar spine were obtained without intravenous contrast.  COMPARISON:  None.  MRIs of the lumbar spine dated 02/24/2014 and 12/25/2013 and 09/21/2013 and CT scan of the chest dated 07/12/2013 and chest x-ray dated 10/17/2007  FINDINGS: MR THORACIC SPINE FINDINGS  There is chronic accentuation of the thoracic kyphosis with an old compression deformity of T7, unchanged since 2009. There is diffuse very low signal intensity from the vertebral bodies throughout the thoracic spine. This is most likely related to the patient's myeloma or possibly a red marrow reactivation secondary to anemia. The thoracic spinal cord is normal. There is no bone destruction. There is no myelopathy or mass lesion. There is consolidation in both lung bases, more extensive on the right than the left. Benign knee hemangiomata are present in T12 and L1. Normal conus tip at  L1.  MR LUMBAR SPINE FINDINGS  Normal conus tip at L1.  T12-L1:  Normal disc.  Benign hemangiomata and T12 and L1.  Psoas muscles bilaterally.  L2-3: There is pus in the disc space and there is a small amount of pus that extends into the ventral epidural space with slight compression of the thecal sac. There is is new since the prior MRI. This inflammatory process extends into the right neural foramen more extensively than on the prior study and extends extra foraminal. There is gas in the disc space and in the superior aspect of the L3 vertebra as previously demonstrated.  There is diffuse abnormal enhancement of the psoas muscles after contrast administration. No discrete psoas abscesses however.  L3-4: The inflammatory changes at the L3-4 level have improved since the prior exam. There is slight enhancement in the disc space and of the endplates to a lesser degree. There is an unchanged right foraminal and extra foraminal disc protrusion.  L4-5:  The disc is normal.  No visible epidural enhancement.  L5-S1: Stable grade 1 spondylolisthesis with a diffuse broad-based bulge of the uncovered disc. Postsurgical changes on the left. Enhancing scar tissue to the expected degree after contrast administration. No evidence of epidural abscess. Diminished enhancement of the left pedicle and facets at L5.  There is increased enhancement in the sacral spinal canal below the terminale shin of the thecal sac at S2. No discrete epidural abscess at that site.  IMPRESSION: MR THORACIC SPINE IMPRESSION  No acute abnormality of the thoracic spine.  Old compression deformity of T7.  Diffuse low signal from the bone marrow of the thoracic spine which could be due to red marrow reactivation or be associated with the patient's myeloma. No pathologic enhancement of the thoracic spine after contrast administration. Specifically, no evidence of epidural abscess, discitis, or osteomyelitis.  Bilateral consolidative infiltrates in both  lower lobes consistent with pneumonia.  MR LUMBAR SPINE IMPRESSION  Progressive osteomyelitis and discitis at L2-3 with a new small ventral epidural abscess at L2-3 extending into the right neural foramen.  New enhancement of the psoas muscles bilaterally suggestive of myositis. No discrete  Improved discitis and osteomyelitis at L3-4.  Critical Value/emergent results were called by telephone at the time of interpretation on 08/13/2014 at 4:41 pm to Dr. Colin Rhein, who verbally acknowledged these results.   Electronically Signed   By:  Lorriane Shire M.D.   On: 08/13/2014 16:11   Mr Lumbar Spine W Wo Contrast  08/13/2014   CLINICAL DATA:  Chronic progressive back pain. History of epidural abscess. Progressive headache. Bilateral leg pain.  EXAM: MRI THORACIC AND LUMBAR SPINE WITHOUT CONTRAST  TECHNIQUE: Multiplanar and multiecho pulse sequences of the thoracic and lumbar spine were obtained without intravenous contrast.  COMPARISON:  None.  MRIs of the lumbar spine dated 02/24/2014 and 12/25/2013 and 09/21/2013 and CT scan of the chest dated 07/12/2013 and chest x-ray dated 10/17/2007  FINDINGS: MR THORACIC SPINE FINDINGS  There is chronic accentuation of the thoracic kyphosis with an old compression deformity of T7, unchanged since 2009. There is diffuse very low signal intensity from the vertebral bodies throughout the thoracic spine. This is most likely related to the patient's myeloma or possibly a red marrow reactivation secondary to anemia. The thoracic spinal cord is normal. There is no bone destruction. There is no myelopathy or mass lesion. There is consolidation in both lung bases, more extensive on the right than the left. Benign knee hemangiomata are present in T12 and L1. Normal conus tip at L1.  MR LUMBAR SPINE FINDINGS  Normal conus tip at L1.  T12-L1:  Normal disc.  Benign hemangiomata and T12 and L1.  Psoas muscles bilaterally.  L2-3: There is pus in the disc space and there is a small amount of pus  that extends into the ventral epidural space with slight compression of the thecal sac. There is is new since the prior MRI. This inflammatory process extends into the right neural foramen more extensively than on the prior study and extends extra foraminal. There is gas in the disc space and in the superior aspect of the L3 vertebra as previously demonstrated.  There is diffuse abnormal enhancement of the psoas muscles after contrast administration. No discrete psoas abscesses however.  L3-4: The inflammatory changes at the L3-4 level have improved since the prior exam. There is slight enhancement in the disc space and of the endplates to a lesser degree. There is an unchanged right foraminal and extra foraminal disc protrusion.  L4-5:  The disc is normal.  No visible epidural enhancement.  L5-S1: Stable grade 1 spondylolisthesis with a diffuse broad-based bulge of the uncovered disc. Postsurgical changes on the left. Enhancing scar tissue to the expected degree after contrast administration. No evidence of epidural abscess. Diminished enhancement of the left pedicle and facets at L5.  There is increased enhancement in the sacral spinal canal below the terminale shin of the thecal sac at S2. No discrete epidural abscess at that site.  IMPRESSION: MR THORACIC SPINE IMPRESSION  No acute abnormality of the thoracic spine.  Old compression deformity of T7.  Diffuse low signal from the bone marrow of the thoracic spine which could be due to red marrow reactivation or be associated with the patient's myeloma. No pathologic enhancement of the thoracic spine after contrast administration. Specifically, no evidence of epidural abscess, discitis, or osteomyelitis.  Bilateral consolidative infiltrates in both lower lobes consistent with pneumonia.  MR LUMBAR SPINE IMPRESSION  Progressive osteomyelitis and discitis at L2-3 with a new small ventral epidural abscess at L2-3 extending into the right neural foramen.  New  enhancement of the psoas muscles bilaterally suggestive of myositis. No discrete  Improved discitis and osteomyelitis at L3-4.  Critical Value/emergent results were called by telephone at the time of interpretation on 08/13/2014 at 4:41 pm to Dr. Colin Rhein, who verbally acknowledged these results.  Electronically Signed   By: Lorriane Shire M.D.   On: 08/13/2014 16:11    Assessment/Plan  64 yo female with severe lower back pain from recurrent epidural absess and progression of osteomyelitis/discitis L2-3  Principal Problem:   Epidural abscess, L2-L3- given dose of vanc/zosyn in ED.  Will hold any further abx in order to get aspirate in am.  Order IR to do drainage of absess to obtain culture data.  Pt is stable no signs of sepsis.  ID and neurosurgery have been called for consultations and will see in am.  Provide prn iv dilaudid for severe pain.  Hold anticoagulants for procedure tomorrow.  Active Problems:   Plasmacytoma of bone   Osteomyelitis of lumbar spine- as above   Hypertension- stable, cont home meds   Acute severe low back pain  Admit to med surg.  Full code.  Autumn Duran A 08/13/2014, 6:56 PM

## 2014-08-13 NOTE — ED Notes (Signed)
Pt. AxO x4 in MRI holding area. Stating that pain medication improved pain score to 6/10 but unable to tolerate MRI due to back pain on table. Administered additional 1 mg dilaudid per Dr. Regenia Skeeter order. Pt. Placed on 2L O2 due to high dose of pain medication.

## 2014-08-13 NOTE — Transfer of Care (Signed)
Immediate Anesthesia Transfer of Care Note  Patient: Autumn Duran  Procedure(s) Performed: Procedure(s): RADIOLOGY WITH ANESTHESIA  (N/A)  Patient Location: PACU  Anesthesia Type:General  Level of Consciousness: awake, alert  and oriented  Airway & Oxygen Therapy: Patient Spontanous Breathing and Patient connected to nasal cannula oxygen  Post-op Assessment: Report given to RN, Post -op Vital signs reviewed and stable and Patient moving all extremities X 4  Post vital signs: Reviewed and stable  Last Vitals:  Filed Vitals:   08/13/14 1630  BP:   Pulse:   Temp: 37.1 C  Resp:     Complications: No apparent anesthesia complications

## 2014-08-13 NOTE — ED Notes (Signed)
MRI contacted RN regarding pt. Pain not improved from first dose of IV dilaudid. Pt. Now in MRI holding area. Dr. Regenia Skeeter notified and verbal order for additional 1 mg of dilaudid given.

## 2014-08-13 NOTE — Anesthesia Preprocedure Evaluation (Signed)
Anesthesia Evaluation  Patient identified by MRN, date of birth, ID band Patient awake    Reviewed: Allergy & Precautions, H&P , NPO status , Patient's Chart, lab work & pertinent test results  History of Anesthesia Complications (+) PONV and history of anesthetic complications  Airway Mallampati: II       Dental  (+) Teeth Intact   Pulmonary neg sleep apnea,  Chronic cough     rales    Cardiovascular hypertension, Pt. on medications - angina- Past MI and - CHF - Valvular Problems/MurmursRhythm:Regular     Neuro/Psych Epidural abscess with left leg symptoms and bowl/bladder dysfunction negative psych ROS   GI/Hepatic Neg liver ROS, hiatal hernia, GERD-  Medicated and Controlled,  Endo/Other  neg diabetesMorbid obesity  Renal/GU negative Renal ROS     Musculoskeletal   Abdominal   Peds  Hematology  (+) anemia , Possible myeloma s/p radiation   Anesthesia Other Findings   Reproductive/Obstetrics                             Anesthesia Physical  Anesthesia Plan  ASA: III  Anesthesia Plan: General   Post-op Pain Management:    Induction: Intravenous  Airway Management Planned: Oral ETT  Additional Equipment: None  Intra-op Plan:   Post-operative Plan: Extubation in OR  Informed Consent: I have reviewed the patients History and Physical, chart, labs and discussed the procedure including the risks, benefits and alternatives for the proposed anesthesia with the patient or authorized representative who has indicated his/her understanding and acceptance.   Dental advisory given  Plan Discussed with: CRNA and Surgeon  Anesthesia Plan Comments:         Anesthesia Quick Evaluation

## 2014-08-13 NOTE — ED Notes (Addendum)
Pt. Complaint of N/V x 2 days and chronic back pain getting worse yesterday and through the night. Pt. 10/10 pain. Pt. On oxycodone and hydrocodone at home. States nauseous improved with some ginger ale. Pt. Ambulatory for EMS to stretcher outside house.

## 2014-08-13 NOTE — ED Notes (Signed)
Pt returned from [facility]

## 2014-08-13 NOTE — ED Provider Notes (Signed)
CSN: [id number]     Arrival date & time 08/13/14  4765 History   First MD Initiated Contact with Patient 08/13/14 [phone]     Chief Complaint  Patient presents with  . Back Pain     (Consider location/radiation/quality/duration/timing/severity/associated sxs/prior Treatment) HPI  64 year old female with a history of chronic back pain but also a history of lumbar epidural abscess presents with acute worsening of her low back pain. Gradually has been worsening over last couple days but last night became much more severe. After the headache worse and she has developed a gradually worsening headache as well. The pain seems to radiate down both legs. The pain is midline lumbar and lower thoracic. Has not had any fevers. Has been having nausea and vomiting over last 24 hours as well. Patient denies any weakness but it hurts to move her legs. Denies any numbness. Patient is been taking hydrocodone and OxyContin without any relief. Rates her pain as severe. Denies saddle anesthesia or bowel/bladder incontinence.  Past Medical History  Diagnosis Date  . Diverticulosis   . Hemorrhoids   . Hx of adenomatous colonic polyps   . Hiatal hernia   . Hypertension   . Hyperlipidemia   . GERD (gastroesophageal reflux disease)   . Chronic kidney disease     kidney stone s/p stent placement ( removed)  . Complication of anesthesia   . PONV (postoperative nausea and vomiting)   . Bronchitis, chronic   . Chronic back pain   . Lumbar radicular pain   . Compression fracture of L3 lumbar vertebra   . T7 vertebral fracture   . Smoldering myeloma   . Sore throat    Past Surgical History  Procedure Laterality Date  . Cholecystectomy    . Esophagogastroduodenoscopy  12/18/2011    Procedure: ESOPHAGOGASTRODUODENOSCOPY (EGD);  Surgeon: Lafayette Dragon, MD;  Location: [facility];  Service: Endoscopy;  Laterality: N/A;  . Colonoscopy  12/18/2011    Procedure: COLONOSCOPY;  Surgeon: Lafayette Dragon, MD;  Location: [facility]  ENDOSCOPY;  Service: Endoscopy;  Laterality: N/A;  . Ureteral stent placement    . Vertebroplasty N/A 07/14/2013    Procedure: VERTEBROPLASTY WITH LUMBAR THREE BIOPSY;  Surgeon: Charlie Pitter, MD;  Location: [facility];  Service: Neurosurgery;  Laterality: N/A;  VERTEBROPLASTY WITH LUMBAR THREE BIOPSY  . Back surgery    . Lumbar laminectomy for epidural abscess Left 09/22/2013    Procedure: LUMBAR LAMINECTOMY FOR EPIDURAL ABSCESS LEFT LUMBAR FIVE-SACRAL ONE;  Surgeon: Charlie Pitter, MD;  Location: [facility];  Service: Neurosurgery;  Laterality: Left;  . Bone marrow biopsy Left 02/2014  . Bone marrow aspiration Left 02/2014   Family History  Problem Relation Age of Onset  . Breast cancer Sister 56  . Breast cancer Other 63    aunt   History  Substance Use Topics  . Smoking status: Never Smoker   . Smokeless tobacco: Never Used  . Alcohol Use: Yes     Comment: social occ   OB History    No data available     Review of Systems  Constitutional: Negative for fever.  Gastrointestinal: Positive for nausea and vomiting. Negative for abdominal pain.  Genitourinary: Negative for dysuria.  Musculoskeletal: Positive for back pain. Negative for neck pain and neck stiffness.  Neurological: Positive for headaches. Negative for weakness and numbness.  All other systems reviewed and are negative.     Allergies  Morphine and related and Tetracyclines & related  Home Medications  Prior to Admission medications   Medication Sig Start Date End Date Taking? Authorizing Provider  diazepam (VALIUM) 5 MG tablet Take 1 tablet (5 mg total) by mouth every 8 (eight) hours as needed for anxiety. 08/07/14   Patrici Ranks, MD  furosemide (LASIX) 40 MG tablet Take 40 mg by mouth as needed.     Historical Provider, MD  HYDROcodone-acetaminophen [facility]) 10-325 MG per tablet Take 1 or 2 tablets every 4 hours to control pain 07/15/14   Baird Cancer, PA-C  ibuprofen (ADVIL,MOTRIN) 800 MG tablet Take  1 tablet (800 mg total) by mouth every 8 (eight) hours as needed for mild pain. 06/24/14   Baird Cancer, PA-C  magnesium hydroxide (MILK OF MAGNESIA) 400 MG/5ML suspension Take 30 mLs by mouth daily as needed for mild constipation.    Historical Provider, MD  omeprazole (PRILOSEC) 40 MG capsule Take 40 mg by mouth daily as needed (acid reflux).  08/09/13   Historical Provider, MD  ondansetron (ZOFRAN) 4 MG tablet Take 1 tablet (4 mg total) by mouth every 8 (eight) hours as needed for nausea or vomiting. Patient not taking: Reported on 05/11/2014 03/19/14   Patrici Ranks, MD  OxyCODONE (OXYCONTIN) 20 mg T12A 12 hr tablet Take 1 tablet (20 mg total) by mouth every 12 (twelve) hours. 08/07/14   Patrici Ranks, MD  potassium chloride SA (K-DUR,KLOR-CON) 20 MEQ tablet  01/29/14   Historical Provider, MD  RABEprazole (ACIPHEX) 20 MG tablet Take 1 tablet (20 mg total) by mouth daily. 05/08/14   Patrici Ranks, MD  senna (SENOKOT) 8.6 MG tablet Take 1 tablet by mouth as needed.     Historical Provider, MD  tobramycin-dexamethasone [facility]) ophthalmic solution Place 1 drop into the left eye every 4 (four) hours while awake.  01/07/14   Historical Provider, MD   BP 128/57 mmHg  Pulse 94  Temp(Src) 99 F (37.2 C) (Oral)  Resp 20  SpO2 99% Physical Exam  Constitutional: She is oriented to person, place, and time. She appears well-developed and well-nourished. She appears distressed.  HENT:  Head: Normocephalic and atraumatic.  Right Ear: External ear normal.  Left Ear: External ear normal.  Nose: Nose normal.  Eyes: EOM are normal. Pupils are equal, round, and reactive to light. Right eye exhibits no discharge. Left eye exhibits no discharge.  Neck: Normal range of motion. Neck supple.  Cardiovascular: Normal rate, regular rhythm and normal heart sounds.   Pulmonary/Chest: Effort normal and breath sounds normal.  Abdominal: Soft. She exhibits no distension. There is no tenderness.   Neurological: She is alert and oriented to person, place, and time. She displays no Babinski's sign on the right side. She displays no Babinski's sign on the left side.  Reflex Scores:      Patellar reflexes are 2+ on the right side and 2+ on the left side.      Achilles reflexes are 1+ on the right side and 2+ on the left side. Both lower extremities able to lift off bed only a few inches, right appears weaker than left. She states this is due to pain. Normal gross sensation  Skin: Skin is warm and dry.  Nursing note and vitals reviewed.   ED Course  Procedures (including critical care time) Labs Review Labs Reviewed  BASIC METABOLIC PANEL - Abnormal; Notable for the following:    Chloride 99 (*)    Glucose, Bld 126 (*)    All other components within normal limits  CBC WITH DIFFERENTIAL/PLATELET - Abnormal; Notable for the following:    Neutrophils Relative % 89 (*)    Neutro Abs 9.1 (*)    Lymphocytes Relative 9 (*)    Monocytes Relative 2 (*)    All other components within normal limits  URINALYSIS, ROUTINE W REFLEX MICROSCOPIC (NOT AT [facility]) - Abnormal; Notable for the following:    APPearance CLOUDY (*)    Ketones, ur >80 (*)    Leukocytes, UA SMALL (*)    All other components within normal limits  URINE MICROSCOPIC-ADD ON - Abnormal; Notable for the following:    Squamous Epithelial / LPF MANY (*)    Bacteria, UA FEW (*)    All other components within normal limits    Imaging Review Ct Head Wo Contrast  08/13/2014   CLINICAL DATA:  Headache with nausea vomiting 2 days  EXAM: CT HEAD WITHOUT CONTRAST  TECHNIQUE: Contiguous axial images were obtained from the base of the skull through the vertex without intravenous contrast.  COMPARISON:  None.  FINDINGS: Ventricle size normal. Negative for acute or chronic infarction. Negative for hemorrhage or mass. No edema or shift of the midline structures  Chronic sinusitis with mucosal edema in the paranasal sinuses and associated bony  thickening of the maxillary sinus bilaterally. Air-fluid level left maxillary sinus. No skull lesion.  IMPRESSION: Negative CT of the head  Chronic sinusitis.   Electronically Signed   By: Franchot Gallo M.D.   On: 08/13/2014 09:10     EKG Interpretation None      MDM   Final diagnoses:  Low back pain  Low back pain    Patient with acute on chronic low back pain that is very similar to when she was diagnosed with epidural abscess. She has a low-grade fever and a left shift but no elevated white blood cell count. Has "weakness" in her legs but seems to be pain related. Despite multiple doses of IV Dilaudid she is unable to lay still in the MRI due to pain. Discussed with anesthesia he was agreed to sedate her for the MRI. Will need reevaluation and interpretation of the lumbar and thoracic MRI and disposition per the findings. Care transferred to Dr. Colin Rhein with MRI pending.    Sherwood Gambler, MD 08/13/14 [phone]

## 2014-08-13 NOTE — ED Notes (Signed)
Pt still in MRI at this time

## 2014-08-13 NOTE — ED Notes (Signed)
Pt undressed, in gown, on continuous pulse oximetry and blood pressure cuff 

## 2014-08-13 NOTE — ED Notes (Signed)
Charted departure conditions and vitals on wrong patient.

## 2014-08-13 NOTE — ED Notes (Signed)
Regenia Skeeter, MD in with pt at this time

## 2014-08-13 NOTE — ED Notes (Signed)
MRI contacted RN stating that Autumn Duran. Refuses to proceed with MRI at this time. Autumn Duran. On the way back to unit. Dr. Regenia Skeeter notified.

## 2014-08-13 NOTE — ED Notes (Signed)
Pt. States she took hydrocodone last night but wasn't able to get up in the night to get more medications.

## 2014-08-13 NOTE — ED Notes (Signed)
MRI contacted RN regarding pt. Inability to tolerate MRI table due to back pain. Dr. Regenia Skeeter notified and to place an order for pain medication.

## 2014-08-14 ENCOUNTER — Encounter (HOSPITAL_COMMUNITY): Payer: Self-pay | Admitting: Radiology

## 2014-08-14 ENCOUNTER — Inpatient Hospital Stay (HOSPITAL_COMMUNITY): Payer: BLUE CROSS/BLUE SHIELD

## 2014-08-14 DIAGNOSIS — K6812 Psoas muscle abscess: Secondary | ICD-10-CM

## 2014-08-14 DIAGNOSIS — M545 Low back pain: Secondary | ICD-10-CM

## 2014-08-14 DIAGNOSIS — B9562 Methicillin resistant Staphylococcus aureus infection as the cause of diseases classified elsewhere: Secondary | ICD-10-CM

## 2014-08-14 DIAGNOSIS — G061 Intraspinal abscess and granuloma: Principal | ICD-10-CM

## 2014-08-14 DIAGNOSIS — M4626 Osteomyelitis of vertebra, lumbar region: Secondary | ICD-10-CM

## 2014-08-14 LAB — BASIC METABOLIC PANEL
Anion gap: 6 (ref 5–15)
BUN: 8 mg/dL (ref 6–20)
CO2: 28 mmol/L (ref 22–32)
Calcium: 9.7 mg/dL (ref 8.9–10.3)
Chloride: 106 mmol/L (ref 101–111)
Creatinine, Ser: 0.51 mg/dL (ref 0.44–1.00)
GFR calc Af Amer: >60 mL/min (ref >60)
GFR calc non Af Amer: >60 mL/min (ref >60)
Glucose, Bld: 103 mg/dL — ABNORMAL HIGH (ref 65–99)
Potassium: 4 mmol/L (ref 3.5–5.1)
Sodium: 140 mmol/L (ref 135–145)

## 2014-08-14 LAB — PROTIME-INR
INR: 1.15 (ref 0.00–1.49)
Prothrombin Time: 14.8 seconds (ref 11.6–15.2)

## 2014-08-14 LAB — CBC
HCT: 31 % — ABNORMAL LOW (ref 36.0–46.0)
Hemoglobin: 9.8 g/dL — ABNORMAL LOW (ref 12.0–15.0)
MCH: 27.6 pg (ref 26.0–34.0)
MCHC: 31.6 g/dL (ref 30.0–36.0)
MCV: 87.3 fL (ref 78.0–100.0)
Platelets: 264 10*3/uL (ref 150–400)
RBC: 3.55 MIL/uL — ABNORMAL LOW (ref 3.87–5.11)
RDW: 14.1 % (ref 11.5–15.5)
WBC: 10.5 10*3/uL (ref 4.0–10.5)

## 2014-08-14 MED ORDER — VANCOMYCIN HCL 1000 MG IV SOLR
750.0000 mg | Freq: Two times a day (BID) | INTRAVENOUS | Status: DC
  Administered 2014-08-14 – 2014-08-16 (×5): 750 mg via INTRAVENOUS
  Filled 2014-08-14 (×6): qty 750

## 2014-08-14 MED ORDER — CHLORHEXIDINE GLUCONATE CLOTH 2 % EX PADS
6.0000 | MEDICATED_PAD | Freq: Every day | CUTANEOUS | Status: DC
Start: 2014-08-14 — End: 2014-08-18
  Administered 2014-08-15 – 2014-08-18 (×4): 6 via TOPICAL

## 2014-08-14 MED ORDER — MUPIROCIN 2 % EX OINT
1.0000 "application " | TOPICAL_OINTMENT | Freq: Two times a day (BID) | CUTANEOUS | Status: DC
  Administered 2014-08-14 – 2014-08-18 (×9): 1 via NASAL
  Filled 2014-08-14: qty 22

## 2014-08-14 MED ORDER — MIDAZOLAM HCL 2 MG/2ML IJ SOLN
INTRAMUSCULAR | Status: AC | PRN
  Administered 2014-08-14: 1 mg via INTRAVENOUS

## 2014-08-14 NOTE — Anesthesia Postprocedure Evaluation (Signed)
Anesthesia Post Note  Patient: Autumn Duran  Procedure(s) Performed: Procedure(s) (LRB): RADIOLOGY WITH ANESTHESIA  (N/A)  Anesthesia type: General  Patient location: PACU  Post pain: Pain level controlled  Post assessment: Post-op Vital signs reviewed  Last Vitals: BP 130/70 mmHg  Pulse 78  Temp(Src) 36.8 C (Oral)  Resp 18  Ht '5\' 1"'$  (1.549 m)  Wt 150 lb (68.04 kg)  BMI 28.36 kg/m2  SpO2 94%  Post vital signs: Reviewed  Level of consciousness: sedated  Complications: No apparent anesthesia complications

## 2014-08-14 NOTE — Progress Notes (Signed)
[facility]                                                                              Patient Demographics  Autumn Duran, is a 64 y.o. female, DOB - 01-Oct-1950, QBH:[id number]  Admit date - 08/13/2014   Admitting Physician Phillips Grout, MD  Outpatient Primary MD for the patient is [name],[name], MD  LOS - 1   Chief Complaint  Patient presents with  . Back Pain      HPI on 08/13/2014 by Dr. 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 Has been following with ID as outpatient. Was taken off doxycycline several months ago, but her back pain returned more severe she put herself back on the doxy for a couple of weeks (over a month ago). Pt has had severe lower back pain over the last week,not controlled with her home pain meds. Denies any fevers. No n/t/or weakness in her legs but unable to walk due to pain. Denies any saddle anesthesia or urinary/bowel incontinence. Repeat mri today shows epidural absess and progression of her osteo c/w previous mri.  Assessment & Plan   Back pain/Epidural Abscess/osteomyelitis/Discitis -MRI: Aggressive osteomyelitis and discitis L2-3, new small ventral epidural abscess L2-3. New enhancement of the sella as muscle suggestive of mild ascites. Improved discitis and osteomyelitis at L3-4. -Infectious disease consulted and appreciated -Neurosurgery consulted and appreciated -Interventional radiology consulted and appreciated -S/p Fluoro-guided L2/3 disk aspiration - 1cc pus -Patient had MRSA epidural abscess 1 year ago s/p lumbar surgery -She was treated with vancomycin and followed by course of doxycycline -Patient received 1 dose of vanc/zosyn in the ER -Continue pain control  Hypertension -Currently stable, on no home medications  GERD -Continue Protonix  Chronic anemia -Appears to be  microcytic and normocytic -Baseline hemoglobin approximately 10-11 -Will continue to monitor CBC  Code Status: Full  Family Communication: Family at bedside  Disposition Plan: Admitted, pending recommendations from infectious disease and neurosurgery  Time Spent in minutes   30 minutes  Procedures  Disc aspiration  Consults   Interventional radiology Infectious disease Neurosurgery  DVT Prophylaxis  SCDs  Lab Results  Component Value Date   PLT 264 08/14/2014    Medications  Scheduled Meds: . Chlorhexidine Gluconate Cloth  6 each Topical Q0600  . mupirocin ointment  1 application Nasal BID  . OxyCODONE  20 mg Oral Q12H  . pantoprazole  40 mg Oral Daily  . tobramycin-dexamethasone  1 drop Left Eye Q4H while awake   Continuous Infusions:  PRN Meds:.diazepam, HYDROmorphone (DILAUDID) injection, ondansetron **OR** ondansetron (ZOFRAN) IV, senna  Antibiotics    Anti-infectives    Start     Dose/Rate Route Frequency Ordered Stop   08/13/14 1800  vancomycin (VANCOCIN) 1,500 mg in sodium chloride 0.9 % 500 mL IVPB     1,500 mg 250 mL/hr over 120 Minutes Intravenous  Once 08/13/14 1721 08/13/14 2035   08/13/14 1730  piperacillin-tazobactam (ZOSYN) IVPB 3.375 g     3.375 g 100 mL/hr over 30 Minutes Intravenous  Once 08/13/14  1721 08/13/14 1835        Subjective:   Autumn Duran seen and examined today.  Patient continues to complain of back pain especially in her legs. She denies any chest pain, shortness of breath, headache, dizziness. States she saw Dr. for approximately 3 months ago and he recommended continuing medications.    Objective:   Filed Vitals:   08/14/14 1128 08/14/14 1227 08/14/14 1314 08/14/14 1424  BP: 131/61 124/52 123/56 135/65  Pulse: 80 80    Temp:  98.5 F (36.9 C)    TempSrc:  Oral    Resp: '10 14 16 16  '$ Height:      Weight:      SpO2: 99% 84%      Wt Readings from Last 3 Encounters:  08/14/14 68.04 kg (150 lb)  08/13/14 68.947 kg  (152 lb)  08/07/14 69.037 kg (152 lb 3.2 oz)     Intake/Output Summary (Last 24 hours) at 08/14/14 1516 Last data filed at 08/14/14 1453  Gross per 24 hour  Intake    650 ml  Output    900 ml  Net   -250 ml    Exam  General: Well developed, well nourished, appears stated age  64: NCAT, mucous membranes moist.   Cardiovascular: S1 S2 auscultated, no rubs, murmurs or gallops. Regular rate and rhythm.  Respiratory: Clear to auscultation bilaterally with equal chest rise  Abdomen: Soft, nontender, nondistended, + bowel sounds  Extremities: warm dry without cyanosis clubbing or edema  Neuro: AAOx3, cranial nerves grossly intact. Decreased LLE strength  Data Review   Micro Results Recent Results (from the past 240 hour(s))  MRSA PCR Screening     Status: Abnormal   Collection Time: 08/13/14  8:58 PM  Result Value Ref Range Status   MRSA by PCR POSITIVE (A) NEGATIVE Final    Comment:        The GeneXpert MRSA Assay (FDA approved for NASAL specimens only), is one component of a comprehensive MRSA colonization surveillance program. It is not intended to diagnose MRSA infection nor to guide or monitor treatment for MRSA infections. RESULT CALLED TO, READ BACK BY AND VERIFIED WITH: Derrel Nip [id number] SKEENP/WILDERK     Radiology Reports Ct Head Wo Contrast  08/13/2014   CLINICAL DATA:  Headache with nausea vomiting 2 days  EXAM: CT HEAD WITHOUT CONTRAST  TECHNIQUE: Contiguous axial images were obtained from the base of the skull through the vertex without intravenous contrast.  COMPARISON:  None.  FINDINGS: Ventricle size normal. Negative for acute or chronic infarction. Negative for hemorrhage or mass. No edema or shift of the midline structures  Chronic sinusitis with mucosal edema in the paranasal sinuses and associated bony thickening of the maxillary sinus bilaterally. Air-fluid level left maxillary sinus. No skull lesion.  IMPRESSION: Negative CT of the head   Chronic sinusitis.   Electronically Signed   By: Franchot Gallo M.D.   On: 08/13/2014 09:10   Mr Thoracic Spine W Wo Contrast  08/13/2014   CLINICAL DATA:  Chronic progressive back pain. History of epidural abscess. Progressive headache. Bilateral leg pain.  EXAM: MRI THORACIC AND LUMBAR SPINE WITHOUT CONTRAST  TECHNIQUE: Multiplanar and multiecho pulse sequences of the thoracic and lumbar spine were obtained without intravenous contrast.  COMPARISON:  None.  MRIs of the lumbar spine dated 02/24/2014 and 12/25/2013 and 09/21/2013 and CT scan of the chest dated 07/12/2013 and chest x-ray dated 10/17/2007  FINDINGS: MR THORACIC SPINE FINDINGS  There is  chronic accentuation of the thoracic kyphosis with an old compression deformity of T7, unchanged since 2009. There is diffuse very low signal intensity from the vertebral bodies throughout the thoracic spine. This is most likely related to the patient's myeloma or possibly a red marrow reactivation secondary to anemia. The thoracic spinal cord is normal. There is no bone destruction. There is no myelopathy or mass lesion. There is consolidation in both lung bases, more extensive on the right than the left. Benign knee hemangiomata are present in T12 and L1. Normal conus tip at L1.  MR LUMBAR SPINE FINDINGS  Normal conus tip at L1.  T12-L1:  Normal disc.  Benign hemangiomata and T12 and L1.  Psoas muscles bilaterally.  L2-3: There is pus in the disc space and there is a small amount of pus that extends into the ventral epidural space with slight compression of the thecal sac. There is is new since the prior MRI. This inflammatory process extends into the right neural foramen more extensively than on the prior study and extends extra foraminal. There is gas in the disc space and in the superior aspect of the L3 vertebra as previously demonstrated.  There is diffuse abnormal enhancement of the psoas muscles after contrast administration. No discrete psoas abscesses  however.  L3-4: The inflammatory changes at the L3-4 level have improved since the prior exam. There is slight enhancement in the disc space and of the endplates to a lesser degree. There is an unchanged right foraminal and extra foraminal disc protrusion.  L4-5:  The disc is normal.  No visible epidural enhancement.  L5-S1: Stable grade 1 spondylolisthesis with a diffuse broad-based bulge of the uncovered disc. Postsurgical changes on the left. Enhancing scar tissue to the expected degree after contrast administration. No evidence of epidural abscess. Diminished enhancement of the left pedicle and facets at L5.  There is increased enhancement in the sacral spinal canal below the terminale shin of the thecal sac at S2. No discrete epidural abscess at that site.  IMPRESSION: MR THORACIC SPINE IMPRESSION  No acute abnormality of the thoracic spine.  Old compression deformity of T7.  Diffuse low signal from the bone marrow of the thoracic spine which could be due to red marrow reactivation or be associated with the patient's myeloma. No pathologic enhancement of the thoracic spine after contrast administration. Specifically, no evidence of epidural abscess, discitis, or osteomyelitis.  Bilateral consolidative infiltrates in both lower lobes consistent with pneumonia.  MR LUMBAR SPINE IMPRESSION  Progressive osteomyelitis and discitis at L2-3 with a new small ventral epidural abscess at L2-3 extending into the right neural foramen.  New enhancement of the psoas muscles bilaterally suggestive of myositis. No discrete  Improved discitis and osteomyelitis at L3-4.  Critical Value/emergent results were called by telephone at the time of interpretation on 08/13/2014 at 4:41 pm to Dr. Colin Rhein, who verbally acknowledged these results.   Electronically Signed   By: Lorriane Shire M.D.   On: 08/13/2014 16:11   Mr Lumbar Spine W Wo Contrast  08/13/2014   CLINICAL DATA:  Chronic progressive back pain. History of epidural abscess.  Progressive headache. Bilateral leg pain.  EXAM: MRI THORACIC AND LUMBAR SPINE WITHOUT CONTRAST  TECHNIQUE: Multiplanar and multiecho pulse sequences of the thoracic and lumbar spine were obtained without intravenous contrast.  COMPARISON:  None.  MRIs of the lumbar spine dated 02/24/2014 and 12/25/2013 and 09/21/2013 and CT scan of the chest dated 07/12/2013 and chest x-ray dated 10/17/2007  FINDINGS: MR  THORACIC SPINE FINDINGS  There is chronic accentuation of the thoracic kyphosis with an old compression deformity of T7, unchanged since 2009. There is diffuse very low signal intensity from the vertebral bodies throughout the thoracic spine. This is most likely related to the patient's myeloma or possibly a red marrow reactivation secondary to anemia. The thoracic spinal cord is normal. There is no bone destruction. There is no myelopathy or mass lesion. There is consolidation in both lung bases, more extensive on the right than the left. Benign knee hemangiomata are present in T12 and L1. Normal conus tip at L1.  MR LUMBAR SPINE FINDINGS  Normal conus tip at L1.  T12-L1:  Normal disc.  Benign hemangiomata and T12 and L1.  Psoas muscles bilaterally.  L2-3: There is pus in the disc space and there is a small amount of pus that extends into the ventral epidural space with slight compression of the thecal sac. There is is new since the prior MRI. This inflammatory process extends into the right neural foramen more extensively than on the prior study and extends extra foraminal. There is gas in the disc space and in the superior aspect of the L3 vertebra as previously demonstrated.  There is diffuse abnormal enhancement of the psoas muscles after contrast administration. No discrete psoas abscesses however.  L3-4: The inflammatory changes at the L3-4 level have improved since the prior exam. There is slight enhancement in the disc space and of the endplates to a lesser degree. There is an unchanged right foraminal and  extra foraminal disc protrusion.  L4-5:  The disc is normal.  No visible epidural enhancement.  L5-S1: Stable grade 1 spondylolisthesis with a diffuse broad-based bulge of the uncovered disc. Postsurgical changes on the left. Enhancing scar tissue to the expected degree after contrast administration. No evidence of epidural abscess. Diminished enhancement of the left pedicle and facets at L5.  There is increased enhancement in the sacral spinal canal below the terminale shin of the thecal sac at S2. No discrete epidural abscess at that site.  IMPRESSION: MR THORACIC SPINE IMPRESSION  No acute abnormality of the thoracic spine.  Old compression deformity of T7.  Diffuse low signal from the bone marrow of the thoracic spine which could be due to red marrow reactivation or be associated with the patient's myeloma. No pathologic enhancement of the thoracic spine after contrast administration. Specifically, no evidence of epidural abscess, discitis, or osteomyelitis.  Bilateral consolidative infiltrates in both lower lobes consistent with pneumonia.  MR LUMBAR SPINE IMPRESSION  Progressive osteomyelitis and discitis at L2-3 with a new small ventral epidural abscess at L2-3 extending into the right neural foramen.  New enhancement of the psoas muscles bilaterally suggestive of myositis. No discrete  Improved discitis and osteomyelitis at L3-4.  Critical Value/emergent results were called by telephone at the time of interpretation on 08/13/2014 at 4:41 pm to Dr. Colin Rhein, who verbally acknowledged these results.   Electronically Signed   By: Lorriane Shire M.D.   On: 08/13/2014 16:11   Ir Fluoro Guide Ndl Plmt / Bx  08/14/2014   CLINICAL DATA:  L2-3 discitis  EXAM: IR FLUORO GUIDE NEEDLE PLACEMENT /BIOPSY  FLUOROSCOPY TIME:  2 minutes and 24 seconds  MEDICATIONS AND MEDICAL HISTORY: Versed 1 mg, Fentanyl none mcg.  Additional Medications: None.  ANESTHESIA/SEDATION: Moderate sedation time: 15 minutes  CONTRAST:  None   PROCEDURE: The procedure, risks, benefits, and alternatives were explained to the patient. Questions regarding the procedure were encouraged and  answered. The patient understands and consents to the procedure.  The back was prepped with Betadine in a sterile fashion, and a sterile drape was applied covering the operative field. A sterile gown and sterile gloves were used for the procedure.  Under fluoroscopic guidance, an 18 gauge needle was inserted into the L2-3 disc via a right posterior lateral approach. Aspiration yielded a cc of pus.  FINDINGS: Image documents needle placement in the W7-3 disc  COMPLICATIONS: None  IMPRESSION: Successful L2-3 disc aspiration.   Electronically Signed   By: Marybelle Killings M.D.   On: 08/14/2014 13:24    CBC  Recent Labs Lab 08/13/14 0804 08/14/14 0314  WBC 10.3 10.5  HGB 12.1 9.8*  HCT 36.7 31.0*  PLT 339 264  MCV 84.2 87.3  MCH 27.8 27.6  MCHC 33.0 31.6  RDW 13.7 14.1  LYMPHSABS 1.0  --   MONOABS 0.2  --   EOSABS 0.0  --   BASOSABS 0.0  --     Chemistries   Recent Labs Lab 08/13/14 0804 08/14/14 0314  NA 136 140  K 4.3 4.0  CL 99* 106  CO2 25 28  GLUCOSE 126* 103*  BUN 14 8  CREATININE 0.70 0.51  CALCIUM 10.1 9.7   ------------------------------------------------------------------------------------------------------------------ estimated creatinine clearance is 63.5 mL/min (by C-G formula based on Cr of 0.51). ------------------------------------------------------------------------------------------------------------------ No results for input(s): HGBA1C in the last 72 hours. ------------------------------------------------------------------------------------------------------------------ No results for input(s): CHOL, HDL, LDLCALC, TRIG, CHOLHDL, LDLDIRECT in the last 72 hours. ------------------------------------------------------------------------------------------------------------------ No results for input(s): TSH, T4TOTAL,  T3FREE, THYROIDAB in the last 72 hours.  Invalid input(s): FREET3 ------------------------------------------------------------------------------------------------------------------ No results for input(s): VITAMINB12, FOLATE, FERRITIN, TIBC, IRON, RETICCTPCT in the last 72 hours.  Coagulation profile  Recent Labs Lab 08/14/14 0314  INR 1.15    No results for input(s): DDIMER in the last 72 hours.  Cardiac Enzymes No results for input(s): CKMB, TROPONINI, MYOGLOBIN in the last 168 hours.  Invalid input(s): CK ------------------------------------------------------------------------------------------------------------------ Invalid input(s): POCBNP    Brees Hounshell D.O. on 08/14/2014 at 3:16 PM  Between 7am to 7pm - Pager - [phone]  After 7pm go to [email or web address] - password [email or web address]  And look for the night coverage person covering for me after hours  [facility] Office  [phone]

## 2014-08-14 NOTE — Consult Note (Signed)
Reason for Consult:recurrent L2-L3 discitis Referring Physician: [name] is an 65 y.o. female.  HPI: 64 year old female with complicated lumbar history. Patient presented with atypical pathologic fracture of L3 in May 2015. Patient underwent vertebral biopsy and vertebral plasty of L3. Initially diagnosed with possible plasmacytoma. Patient underwent subsequent radiation treatment and chemotherapy. Subsequently diagnosis of plasmacytoma brought into question. Patient developed late L5 osteomyelitis with epidural abscess and radiculopathy requiring emergent laminectomy and evacuation of epidural abscess in July 2015. Patient placed on long-term IV antibody therapy. Osteomyelitis discitis felt to have cleared. Patient's antibody stopped and she developed recurrent L2-3 osteomyelitis discitis. Patient underwent repeat treatment with IV antibody and was placed on suppression therapy with oral doxycycline. This was ceased approximately 2 months ago. Patient presents with recurrent severe lumbar pain. Workup demonstrates evidence of recurrent/worsening L2-3 osteomyelitis discitis with small right ventral epidural abscess. Disc space has subsequently been aspirated and cultures are pending.  Past Medical History  Diagnosis Date  . Diverticulosis   . Hemorrhoids   . Hx of adenomatous colonic polyps   . Hiatal hernia   . Hypertension   . Hyperlipidemia   . GERD (gastroesophageal reflux disease)   . Chronic kidney disease     kidney stone s/p stent placement ( removed)  . Complication of anesthesia   . PONV (postoperative nausea and vomiting)   . Bronchitis, chronic   . Chronic back pain   . Lumbar radicular pain   . Compression fracture of L3 lumbar vertebra   . T7 vertebral fracture   . Smoldering myeloma   . Sore throat     Past Surgical History  Procedure Laterality Date  . Cholecystectomy    . Esophagogastroduodenoscopy  12/18/2011    Procedure: ESOPHAGOGASTRODUODENOSCOPY  (EGD);  Surgeon: Lafayette Dragon, MD;  Location: [facility];  Service: Endoscopy;  Laterality: N/A;  . Colonoscopy  12/18/2011    Procedure: COLONOSCOPY;  Surgeon: Lafayette Dragon, MD;  Location: [facility];  Service: Endoscopy;  Laterality: N/A;  . Ureteral stent placement    . Vertebroplasty N/A 07/14/2013    Procedure: VERTEBROPLASTY WITH LUMBAR THREE BIOPSY;  Surgeon: Charlie Pitter, MD;  Location: [facility];  Service: Neurosurgery;  Laterality: N/A;  VERTEBROPLASTY WITH LUMBAR THREE BIOPSY  . Back surgery    . Lumbar laminectomy for epidural abscess Left 09/22/2013    Procedure: LUMBAR LAMINECTOMY FOR EPIDURAL ABSCESS LEFT LUMBAR FIVE-SACRAL ONE;  Surgeon: Charlie Pitter, MD;  Location: [facility];  Service: Neurosurgery;  Laterality: Left;  . Bone marrow biopsy Left 02/2014  . Bone marrow aspiration Left 02/2014  . Radiology with anesthesia N/A 08/13/2014    Procedure: RADIOLOGY WITH ANESTHESIA ;  Surgeon: Medication Radiologist, MD;  Location: [facility];  Service: Radiology;  Laterality: N/A;    Family History  Problem Relation Age of Onset  . Breast cancer Sister 29  . Breast cancer Other 15    aunt    Social History:  reports that she has never smoked. She has never used smokeless tobacco. She reports that she drinks alcohol. She reports that she does not use illicit drugs.  Allergies:  Allergies  Allergen Reactions  . Morphine And Related Other (See Comments)    Headache  . Tetracyclines & Related Nausea And Vomiting    Medications: I have reviewed the patient's current medications.  Results for orders placed or performed during the hospital encounter of 08/13/14 (from the past 48 hour(s))  Basic metabolic panel  Status: Abnormal   Collection Time: 08/13/14  8:04 AM  Result Value Ref Range   Sodium 136 135 - 145 mmol/L   Potassium 4.3 3.5 - 5.1 mmol/L    Comment: SPECIMEN HEMOLYZED. HEMOLYSIS MAY AFFECT INTEGRITY OF RESULTS.   Chloride 99 (L) 101 - 111 mmol/L    CO2 25 22 - 32 mmol/L   Glucose, Bld 126 (H) 65 - 99 mg/dL   BUN 14 6 - 20 mg/dL   Creatinine, Ser 0.70 0.44 - 1.00 mg/dL   Calcium 10.1 8.9 - 10.3 mg/dL   GFR calc non Af Amer >60 >60 mL/min   GFR calc Af Amer >60 >60 mL/min    Comment: (NOTE) The eGFR has been calculated using the CKD EPI equation. This calculation has not been validated in all clinical situations. eGFR's persistently <60 mL/min signify possible Chronic Kidney Disease.    Anion gap 12 5 - 15  CBC with Differential     Status: Abnormal   Collection Time: 08/13/14  8:04 AM  Result Value Ref Range   WBC 10.3 4.0 - 10.5 K/uL   RBC 4.36 3.87 - 5.11 MIL/uL   Hemoglobin 12.1 12.0 - 15.0 g/dL   HCT 36.7 36.0 - 46.0 %   MCV 84.2 78.0 - 100.0 fL   MCH 27.8 26.0 - 34.0 pg   MCHC 33.0 30.0 - 36.0 g/dL   RDW 13.7 11.5 - 15.5 %   Platelets 339 150 - 400 K/uL   Neutrophils Relative % 89 (H) 43 - 77 %   Neutro Abs 9.1 (H) 1.7 - 7.7 K/uL   Lymphocytes Relative 9 (L) 12 - 46 %   Lymphs Abs 1.0 0.7 - 4.0 K/uL   Monocytes Relative 2 (L) 3 - 12 %   Monocytes Absolute 0.2 0.1 - 1.0 K/uL   Eosinophils Relative 0 0 - 5 %   Eosinophils Absolute 0.0 0.0 - 0.7 K/uL   Basophils Relative 0 0 - 1 %   Basophils Absolute 0.0 0.0 - 0.1 K/uL  Urinalysis, Routine w reflex microscopic (not at [facility])     Status: Abnormal   Collection Time: 08/13/14  8:50 AM  Result Value Ref Range   Color, Urine YELLOW YELLOW   APPearance CLOUDY (A) CLEAR   Specific Gravity, Urine 1.017 1.005 - 1.030   pH 6.5 5.0 - 8.0   Glucose, UA NEGATIVE NEGATIVE mg/dL   Hgb urine dipstick NEGATIVE NEGATIVE   Bilirubin Urine NEGATIVE NEGATIVE   Ketones, ur >80 (A) NEGATIVE mg/dL   Protein, ur NEGATIVE NEGATIVE mg/dL   Urobilinogen, UA 0.2 0.0 - 1.0 mg/dL   Nitrite NEGATIVE NEGATIVE   Leukocytes, UA SMALL (A) NEGATIVE  Urine microscopic-add on     Status: Abnormal   Collection Time: 08/13/14  8:50 AM  Result Value Ref Range   Squamous Epithelial / LPF MANY (A)  RARE   WBC, UA 11-20 <3 WBC/hpf   RBC / HPF 0-2 <3 RBC/hpf   Bacteria, UA FEW (A) RARE   Urine-Other MUCOUS PRESENT   MRSA PCR Screening     Status: Abnormal   Collection Time: 08/13/14  8:58 PM  Result Value Ref Range   MRSA by PCR POSITIVE (A) NEGATIVE    Comment:        The GeneXpert MRSA Assay (FDA approved for NASAL specimens only), is one component of a comprehensive MRSA colonization surveillance program. It is not intended to diagnose MRSA infection nor to guide or monitor treatment for MRSA infections.  RESULT CALLED TO, READ BACK BY AND VERIFIED WITH: Derrel Nip [id number] SKEENP/WILDERK   Basic metabolic panel     Status: Abnormal   Collection Time: 08/14/14  3:14 AM  Result Value Ref Range   Sodium 140 135 - 145 mmol/L   Potassium 4.0 3.5 - 5.1 mmol/L   Chloride 106 101 - 111 mmol/L   CO2 28 22 - 32 mmol/L   Glucose, Bld 103 (H) 65 - 99 mg/dL   BUN 8 6 - 20 mg/dL   Creatinine, Ser 0.51 0.44 - 1.00 mg/dL   Calcium 9.7 8.9 - 10.3 mg/dL   GFR calc non Af Amer >60 >60 mL/min   GFR calc Af Amer >60 >60 mL/min    Comment: (NOTE) The eGFR has been calculated using the CKD EPI equation. This calculation has not been validated in all clinical situations. eGFR's persistently <60 mL/min signify possible Chronic Kidney Disease.    Anion gap 6 5 - 15  CBC     Status: Abnormal   Collection Time: 08/14/14  3:14 AM  Result Value Ref Range   WBC 10.5 4.0 - 10.5 K/uL   RBC 3.55 (L) 3.87 - 5.11 MIL/uL   Hemoglobin 9.8 (L) 12.0 - 15.0 g/dL   HCT 31.0 (L) 36.0 - 46.0 %   MCV 87.3 78.0 - 100.0 fL   MCH 27.6 26.0 - 34.0 pg   MCHC 31.6 30.0 - 36.0 g/dL   RDW 14.1 11.5 - 15.5 %   Platelets 264 150 - 400 K/uL  Protime-INR     Status: None   Collection Time: 08/14/14  3:14 AM  Result Value Ref Range   Prothrombin Time 14.8 11.6 - 15.2 seconds   INR 1.15 0.00 - 1.49    Ct Head Wo Contrast  08/13/2014   CLINICAL DATA:  Headache with nausea vomiting 2 days  EXAM: CT HEAD  WITHOUT CONTRAST  TECHNIQUE: Contiguous axial images were obtained from the base of the skull through the vertex without intravenous contrast.  COMPARISON:  None.  FINDINGS: Ventricle size normal. Negative for acute or chronic infarction. Negative for hemorrhage or mass. No edema or shift of the midline structures  Chronic sinusitis with mucosal edema in the paranasal sinuses and associated bony thickening of the maxillary sinus bilaterally. Air-fluid level left maxillary sinus. No skull lesion.  IMPRESSION: Negative CT of the head  Chronic sinusitis.   Electronically Signed   By: Franchot Gallo M.D.   On: 08/13/2014 09:10   Mr Thoracic Spine W Wo Contrast  08/13/2014   CLINICAL DATA:  Chronic progressive back pain. History of epidural abscess. Progressive headache. Bilateral leg pain.  EXAM: MRI THORACIC AND LUMBAR SPINE WITHOUT CONTRAST  TECHNIQUE: Multiplanar and multiecho pulse sequences of the thoracic and lumbar spine were obtained without intravenous contrast.  COMPARISON:  None.  MRIs of the lumbar spine dated 02/24/2014 and 12/25/2013 and 09/21/2013 and CT scan of the chest dated 07/12/2013 and chest x-ray dated 10/17/2007  FINDINGS: MR THORACIC SPINE FINDINGS  There is chronic accentuation of the thoracic kyphosis with an old compression deformity of T7, unchanged since 2009. There is diffuse very low signal intensity from the vertebral bodies throughout the thoracic spine. This is most likely related to the patient's myeloma or possibly a red marrow reactivation secondary to anemia. The thoracic spinal cord is normal. There is no bone destruction. There is no myelopathy or mass lesion. There is consolidation in both lung bases, more extensive on the right than  the left. Benign knee hemangiomata are present in T12 and L1. Normal conus tip at L1.  MR LUMBAR SPINE FINDINGS  Normal conus tip at L1.  T12-L1:  Normal disc.  Benign hemangiomata and T12 and L1.  Psoas muscles bilaterally.  L2-3: There is pus  in the disc space and there is a small amount of pus that extends into the ventral epidural space with slight compression of the thecal sac. There is is new since the prior MRI. This inflammatory process extends into the right neural foramen more extensively than on the prior study and extends extra foraminal. There is gas in the disc space and in the superior aspect of the L3 vertebra as previously demonstrated.  There is diffuse abnormal enhancement of the psoas muscles after contrast administration. No discrete psoas abscesses however.  L3-4: The inflammatory changes at the L3-4 level have improved since the prior exam. There is slight enhancement in the disc space and of the endplates to a lesser degree. There is an unchanged right foraminal and extra foraminal disc protrusion.  L4-5:  The disc is normal.  No visible epidural enhancement.  L5-S1: Stable grade 1 spondylolisthesis with a diffuse broad-based bulge of the uncovered disc. Postsurgical changes on the left. Enhancing scar tissue to the expected degree after contrast administration. No evidence of epidural abscess. Diminished enhancement of the left pedicle and facets at L5.  There is increased enhancement in the sacral spinal canal below the terminale shin of the thecal sac at S2. No discrete epidural abscess at that site.  IMPRESSION: MR THORACIC SPINE IMPRESSION  No acute abnormality of the thoracic spine.  Old compression deformity of T7.  Diffuse low signal from the bone marrow of the thoracic spine which could be due to red marrow reactivation or be associated with the patient's myeloma. No pathologic enhancement of the thoracic spine after contrast administration. Specifically, no evidence of epidural abscess, discitis, or osteomyelitis.  Bilateral consolidative infiltrates in both lower lobes consistent with pneumonia.  MR LUMBAR SPINE IMPRESSION  Progressive osteomyelitis and discitis at L2-3 with a new small ventral epidural abscess at L2-3  extending into the right neural foramen.  New enhancement of the psoas muscles bilaterally suggestive of myositis. No discrete  Improved discitis and osteomyelitis at L3-4.  Critical Value/emergent results were called by telephone at the time of interpretation on 08/13/2014 at 4:41 pm to Dr. Colin Rhein, who verbally acknowledged these results.   Electronically Signed   By: Lorriane Shire M.D.   On: 08/13/2014 16:11   Mr Lumbar Spine W Wo Contrast  08/13/2014   CLINICAL DATA:  Chronic progressive back pain. History of epidural abscess. Progressive headache. Bilateral leg pain.  EXAM: MRI THORACIC AND LUMBAR SPINE WITHOUT CONTRAST  TECHNIQUE: Multiplanar and multiecho pulse sequences of the thoracic and lumbar spine were obtained without intravenous contrast.  COMPARISON:  None.  MRIs of the lumbar spine dated 02/24/2014 and 12/25/2013 and 09/21/2013 and CT scan of the chest dated 07/12/2013 and chest x-ray dated 10/17/2007  FINDINGS: MR THORACIC SPINE FINDINGS  There is chronic accentuation of the thoracic kyphosis with an old compression deformity of T7, unchanged since 2009. There is diffuse very low signal intensity from the vertebral bodies throughout the thoracic spine. This is most likely related to the patient's myeloma or possibly a red marrow reactivation secondary to anemia. The thoracic spinal cord is normal. There is no bone destruction. There is no myelopathy or mass lesion. There is consolidation in both lung bases,  more extensive on the right than the left. Benign knee hemangiomata are present in T12 and L1. Normal conus tip at L1.  MR LUMBAR SPINE FINDINGS  Normal conus tip at L1.  T12-L1:  Normal disc.  Benign hemangiomata and T12 and L1.  Psoas muscles bilaterally.  L2-3: There is pus in the disc space and there is a small amount of pus that extends into the ventral epidural space with slight compression of the thecal sac. There is is new since the prior MRI. This inflammatory process extends into the  right neural foramen more extensively than on the prior study and extends extra foraminal. There is gas in the disc space and in the superior aspect of the L3 vertebra as previously demonstrated.  There is diffuse abnormal enhancement of the psoas muscles after contrast administration. No discrete psoas abscesses however.  L3-4: The inflammatory changes at the L3-4 level have improved since the prior exam. There is slight enhancement in the disc space and of the endplates to a lesser degree. There is an unchanged right foraminal and extra foraminal disc protrusion.  L4-5:  The disc is normal.  No visible epidural enhancement.  L5-S1: Stable grade 1 spondylolisthesis with a diffuse broad-based bulge of the uncovered disc. Postsurgical changes on the left. Enhancing scar tissue to the expected degree after contrast administration. No evidence of epidural abscess. Diminished enhancement of the left pedicle and facets at L5.  There is increased enhancement in the sacral spinal canal below the terminale shin of the thecal sac at S2. No discrete epidural abscess at that site.  IMPRESSION: MR THORACIC SPINE IMPRESSION  No acute abnormality of the thoracic spine.  Old compression deformity of T7.  Diffuse low signal from the bone marrow of the thoracic spine which could be due to red marrow reactivation or be associated with the patient's myeloma. No pathologic enhancement of the thoracic spine after contrast administration. Specifically, no evidence of epidural abscess, discitis, or osteomyelitis.  Bilateral consolidative infiltrates in both lower lobes consistent with pneumonia.  MR LUMBAR SPINE IMPRESSION  Progressive osteomyelitis and discitis at L2-3 with a new small ventral epidural abscess at L2-3 extending into the right neural foramen.  New enhancement of the psoas muscles bilaterally suggestive of myositis. No discrete  Improved discitis and osteomyelitis at L3-4.  Critical Value/emergent results were called by  telephone at the time of interpretation on 08/13/2014 at 4:41 pm to Dr. Colin Rhein, who verbally acknowledged these results.   Electronically Signed   By: Lorriane Shire M.D.   On: 08/13/2014 16:11   Ir Fluoro Guide Ndl Plmt / Bx  08/14/2014   CLINICAL DATA:  L2-3 discitis  EXAM: IR FLUORO GUIDE NEEDLE PLACEMENT /BIOPSY  FLUOROSCOPY TIME:  2 minutes and 24 seconds  MEDICATIONS AND MEDICAL HISTORY: Versed 1 mg, Fentanyl none mcg.  Additional Medications: None.  ANESTHESIA/SEDATION: Moderate sedation time: 15 minutes  CONTRAST:  None  PROCEDURE: The procedure, risks, benefits, and alternatives were explained to the patient. Questions regarding the procedure were encouraged and answered. The patient understands and consents to the procedure.  The back was prepped with Betadine in a sterile fashion, and a sterile drape was applied covering the operative field. A sterile gown and sterile gloves were used for the procedure.  Under fluoroscopic guidance, an 18 gauge needle was inserted into the L2-3 disc via a right posterior lateral approach. Aspiration yielded a cc of pus.  FINDINGS: Image documents needle placement in the B1-5 disc  COMPLICATIONS: None  IMPRESSION: Successful L2-3 disc aspiration.   Electronically Signed   By: Marybelle Killings M.D.   On: 08/14/2014 13:24    Patient is awake and alert. She is obviously in great deal of discomfort. She is moderately toxic. Examination in general finds her to be awake and alert. She is oriented and appropriate. Her cranial nerve function is intact. Motor and sensory function of all 4 extremities is normal. Patient with significant lumbar tenderness. Pain with straight leg raising bilaterally. Sensory examination nonfocal. Reflexes stable. No nuchal rigidity. Blood pressure 135/65, pulse 80, temperature 98.5 F (36.9 C), temperature source Oral, resp. rate 16, height _0  (1.549 m), weight 68.04 kg (150 lb), SpO2 84 %.   Assessment/Plan: Recurrent osteomyelitis discitis  at L2-3. Recommend resumption of IV antibody. Await results of disc space aspiration. I will follow.  Autumn Duran A 08/14/2014, 3:43 PM

## 2014-08-14 NOTE — Progress Notes (Signed)
Patient with history of MRSA, nasal swab result positive for MRSA, contact isolation initiated.

## 2014-08-14 NOTE — Consult Note (Signed)
Chief Complaint: Chief Complaint  Patient presents with  . Back Pain    Referring Physician(s): Pool TRH  History of Present Illness: Autumn Duran is a 64 y.o. female   Pt with lumbar surgery approx 1 yr ago Post op abscess +MRSA Has been treated IV and po Vanco Most recently just weeks ago Continued worsening back pain MRI does reveal L2-3 discitis/abscess Request for aspiration Dr Barbie Banner has reviewed imaging and approves procedure   Past Medical History  Diagnosis Date  . Diverticulosis   . Hemorrhoids   . Hx of adenomatous colonic polyps   . Hiatal hernia   . Hypertension   . Hyperlipidemia   . GERD (gastroesophageal reflux disease)   . Chronic kidney disease     kidney stone s/p stent placement ( removed)  . Complication of anesthesia   . PONV (postoperative nausea and vomiting)   . Bronchitis, chronic   . Chronic back pain   . Lumbar radicular pain   . Compression fracture of L3 lumbar vertebra   . T7 vertebral fracture   . Smoldering myeloma   . Sore throat     Past Surgical History  Procedure Laterality Date  . Cholecystectomy    . Esophagogastroduodenoscopy  12/18/2011    Procedure: ESOPHAGOGASTRODUODENOSCOPY (EGD);  Surgeon: Lafayette Dragon, MD;  Location: [facility];  Service: Endoscopy;  Laterality: N/A;  . Colonoscopy  12/18/2011    Procedure: COLONOSCOPY;  Surgeon: Lafayette Dragon, MD;  Location: [facility];  Service: Endoscopy;  Laterality: N/A;  . Ureteral stent placement    . Vertebroplasty N/A 07/14/2013    Procedure: VERTEBROPLASTY WITH LUMBAR THREE BIOPSY;  Surgeon: Charlie Pitter, MD;  Location: [facility];  Service: Neurosurgery;  Laterality: N/A;  VERTEBROPLASTY WITH LUMBAR THREE BIOPSY  . Back surgery    . Lumbar laminectomy for epidural abscess Left 09/22/2013    Procedure: LUMBAR LAMINECTOMY FOR EPIDURAL ABSCESS LEFT LUMBAR FIVE-SACRAL ONE;  Surgeon: Charlie Pitter, MD;  Location: [facility];  Service: Neurosurgery;  Laterality:  Left;  . Bone marrow biopsy Left 02/2014  . Bone marrow aspiration Left 02/2014    Allergies: Morphine and related and Tetracyclines & related  Medications: Prior to Admission medications   Medication Sig Start Date End Date Taking? Authorizing Provider  diazepam (VALIUM) 5 MG tablet Take 1 tablet (5 mg total) by mouth every 8 (eight) hours as needed for anxiety. 08/07/14  Yes Patrici Ranks, MD  furosemide (LASIX) 40 MG tablet Take 40 mg by mouth daily as needed for fluid.    Yes Historical Provider, MD  HYDROcodone-acetaminophen (NORCO) 10-325 MG per tablet Take 1 or 2 tablets every 4 hours to control pain 07/15/14  Yes Manon Hilding Kefalas, PA-C  ibuprofen (ADVIL,MOTRIN) 800 MG tablet Take 1 tablet (800 mg total) by mouth every 8 (eight) hours as needed for mild pain. 06/24/14  Yes Baird Cancer, PA-C  magnesium hydroxide (MILK OF MAGNESIA) 400 MG/5ML suspension Take 30 mLs by mouth daily as needed for mild constipation.   Yes Historical Provider, MD  omeprazole (PRILOSEC) 40 MG capsule Take 40 mg by mouth daily as needed (acid reflux).  08/09/13  Yes Historical Provider, MD  ondansetron (ZOFRAN) 4 MG tablet Take 1 tablet (4 mg total) by mouth every 8 (eight) hours as needed for nausea or vomiting. 03/19/14  Yes Patrici Ranks, MD  OxyCODONE (OXYCONTIN) 20 mg T12A 12 hr tablet Take 1 tablet (20 mg total) by mouth  every 12 (twelve) hours. 08/07/14  Yes Patrici Ranks, MD  potassium chloride SA (K-DUR,KLOR-CON) 20 MEQ tablet Take 20 mEq by mouth daily as needed (supplement).  01/29/14  Yes Historical Provider, MD  RABEprazole (ACIPHEX) 20 MG tablet Take 1 tablet (20 mg total) by mouth daily. 05/08/14  Yes Patrici Ranks, MD  senna (SENOKOT) 8.6 MG tablet Take 1 tablet by mouth as needed.    Yes Historical Provider, MD  tobramycin-dexamethasone [facility]) ophthalmic solution Place 1 drop into the left eye every 4 (four) hours while awake.  01/07/14  Yes Historical Provider, MD     Family  History  Problem Relation Age of Onset  . Breast cancer Sister 41  . Breast cancer Other 60    aunt    History   Social History  . Marital Status: Single    Spouse Name: N/A  . Number of Children: N/A  . Years of Education: N/A   Occupational History  . Clerk     [employer]   Social History Main Topics  . Smoking status: Never Smoker   . Smokeless tobacco: Never Used  . Alcohol Use: Yes     Comment: social occ  . Drug Use: No  . Sexual Activity: Not on file   Other Topics Concern  . None   Social History Narrative      Review of Systems: A 12 point ROS discussed and pertinent positives are indicated in the HPI above.  All other systems are negative.  Review of Systems  Constitutional: Positive for activity change and appetite change. Negative for fever.  Respiratory: Negative for shortness of breath.   Musculoskeletal: Positive for back pain and gait problem.  Neurological: Positive for weakness.  Psychiatric/Behavioral: Negative for behavioral problems and confusion.    Vital Signs: BP 137/59 mmHg  Pulse 86  Temp(Src) 97.6 F (36.4 C) (Oral)  Resp 18  Ht '5\' 1"'  (1.549 m)  Wt 150 lb (68.04 kg)  BMI 28.36 kg/m2  SpO2 95%  Physical Exam  Constitutional: She is oriented to person, place, and time. She appears well-nourished.  Cardiovascular: Normal rate and regular rhythm.   Pulmonary/Chest: Effort normal and breath sounds normal.  Abdominal: Soft. Bowel sounds are normal.  Musculoskeletal: Normal range of motion.  Severe low back pain  Neurological: She is alert and oriented to person, place, and time.  Skin: Skin is warm and dry.  Psychiatric: She has a normal mood and affect. Her behavior is normal. Judgment and thought content normal.  Nursing note and vitals reviewed.   Mallampati Score:  MD Evaluation Airway: WNL Heart: WNL Abdomen: WNL Chest/ Lungs: WNL ASA  Classification: 3  Imaging: Ct Head Wo Contrast  08/13/2014   CLINICAL  DATA:  Headache with nausea vomiting 2 days  EXAM: CT HEAD WITHOUT CONTRAST  TECHNIQUE: Contiguous axial images were obtained from the base of the skull through the vertex without intravenous contrast.  COMPARISON:  None.  FINDINGS: Ventricle size normal. Negative for acute or chronic infarction. Negative for hemorrhage or mass. No edema or shift of the midline structures  Chronic sinusitis with mucosal edema in the paranasal sinuses and associated bony thickening of the maxillary sinus bilaterally. Air-fluid level left maxillary sinus. No skull lesion.  IMPRESSION: Negative CT of the head  Chronic sinusitis.   Electronically Signed   By: Franchot Gallo M.D.   On: 08/13/2014 09:10   Mr Thoracic Spine W Wo Contrast  08/13/2014   CLINICAL DATA:  Chronic  progressive back pain. History of epidural abscess. Progressive headache. Bilateral leg pain.  EXAM: MRI THORACIC AND LUMBAR SPINE WITHOUT CONTRAST  TECHNIQUE: Multiplanar and multiecho pulse sequences of the thoracic and lumbar spine were obtained without intravenous contrast.  COMPARISON:  None.  MRIs of the lumbar spine dated 02/24/2014 and 12/25/2013 and 09/21/2013 and CT scan of the chest dated 07/12/2013 and chest x-ray dated 10/17/2007  FINDINGS: MR THORACIC SPINE FINDINGS  There is chronic accentuation of the thoracic kyphosis with an old compression deformity of T7, unchanged since 2009. There is diffuse very low signal intensity from the vertebral bodies throughout the thoracic spine. This is most likely related to the patient's myeloma or possibly a red marrow reactivation secondary to anemia. The thoracic spinal cord is normal. There is no bone destruction. There is no myelopathy or mass lesion. There is consolidation in both lung bases, more extensive on the right than the left. Benign knee hemangiomata are present in T12 and L1. Normal conus tip at L1.  MR LUMBAR SPINE FINDINGS  Normal conus tip at L1.  T12-L1:  Normal disc.  Benign hemangiomata and  T12 and L1.  Psoas muscles bilaterally.  L2-3: There is pus in the disc space and there is a small amount of pus that extends into the ventral epidural space with slight compression of the thecal sac. There is is new since the prior MRI. This inflammatory process extends into the right neural foramen more extensively than on the prior study and extends extra foraminal. There is gas in the disc space and in the superior aspect of the L3 vertebra as previously demonstrated.  There is diffuse abnormal enhancement of the psoas muscles after contrast administration. No discrete psoas abscesses however.  L3-4: The inflammatory changes at the L3-4 level have improved since the prior exam. There is slight enhancement in the disc space and of the endplates to a lesser degree. There is an unchanged right foraminal and extra foraminal disc protrusion.  L4-5:  The disc is normal.  No visible epidural enhancement.  L5-S1: Stable grade 1 spondylolisthesis with a diffuse broad-based bulge of the uncovered disc. Postsurgical changes on the left. Enhancing scar tissue to the expected degree after contrast administration. No evidence of epidural abscess. Diminished enhancement of the left pedicle and facets at L5.  There is increased enhancement in the sacral spinal canal below the terminale shin of the thecal sac at S2. No discrete epidural abscess at that site.  IMPRESSION: MR THORACIC SPINE IMPRESSION  No acute abnormality of the thoracic spine.  Old compression deformity of T7.  Diffuse low signal from the bone marrow of the thoracic spine which could be due to red marrow reactivation or be associated with the patient's myeloma. No pathologic enhancement of the thoracic spine after contrast administration. Specifically, no evidence of epidural abscess, discitis, or osteomyelitis.  Bilateral consolidative infiltrates in both lower lobes consistent with pneumonia.  MR LUMBAR SPINE IMPRESSION  Progressive osteomyelitis and discitis  at L2-3 with a new small ventral epidural abscess at L2-3 extending into the right neural foramen.  New enhancement of the psoas muscles bilaterally suggestive of myositis. No discrete  Improved discitis and osteomyelitis at L3-4.  Critical Value/emergent results were called by telephone at the time of interpretation on 08/13/2014 at 4:41 pm to Dr. Colin Rhein, who verbally acknowledged these results.   Electronically Signed   By: Lorriane Shire M.D.   On: 08/13/2014 16:11   Mr Lumbar Spine W Wo Contrast  08/13/2014  CLINICAL DATA:  Chronic progressive back pain. History of epidural abscess. Progressive headache. Bilateral leg pain.  EXAM: MRI THORACIC AND LUMBAR SPINE WITHOUT CONTRAST  TECHNIQUE: Multiplanar and multiecho pulse sequences of the thoracic and lumbar spine were obtained without intravenous contrast.  COMPARISON:  None.  MRIs of the lumbar spine dated 02/24/2014 and 12/25/2013 and 09/21/2013 and CT scan of the chest dated 07/12/2013 and chest x-ray dated 10/17/2007  FINDINGS: MR THORACIC SPINE FINDINGS  There is chronic accentuation of the thoracic kyphosis with an old compression deformity of T7, unchanged since 2009. There is diffuse very low signal intensity from the vertebral bodies throughout the thoracic spine. This is most likely related to the patient's myeloma or possibly a red marrow reactivation secondary to anemia. The thoracic spinal cord is normal. There is no bone destruction. There is no myelopathy or mass lesion. There is consolidation in both lung bases, more extensive on the right than the left. Benign knee hemangiomata are present in T12 and L1. Normal conus tip at L1.  MR LUMBAR SPINE FINDINGS  Normal conus tip at L1.  T12-L1:  Normal disc.  Benign hemangiomata and T12 and L1.  Psoas muscles bilaterally.  L2-3: There is pus in the disc space and there is a small amount of pus that extends into the ventral epidural space with slight compression of the thecal sac. There is is new since  the prior MRI. This inflammatory process extends into the right neural foramen more extensively than on the prior study and extends extra foraminal. There is gas in the disc space and in the superior aspect of the L3 vertebra as previously demonstrated.  There is diffuse abnormal enhancement of the psoas muscles after contrast administration. No discrete psoas abscesses however.  L3-4: The inflammatory changes at the L3-4 level have improved since the prior exam. There is slight enhancement in the disc space and of the endplates to a lesser degree. There is an unchanged right foraminal and extra foraminal disc protrusion.  L4-5:  The disc is normal.  No visible epidural enhancement.  L5-S1: Stable grade 1 spondylolisthesis with a diffuse broad-based bulge of the uncovered disc. Postsurgical changes on the left. Enhancing scar tissue to the expected degree after contrast administration. No evidence of epidural abscess. Diminished enhancement of the left pedicle and facets at L5.  There is increased enhancement in the sacral spinal canal below the terminale shin of the thecal sac at S2. No discrete epidural abscess at that site.  IMPRESSION: MR THORACIC SPINE IMPRESSION  No acute abnormality of the thoracic spine.  Old compression deformity of T7.  Diffuse low signal from the bone marrow of the thoracic spine which could be due to red marrow reactivation or be associated with the patient's myeloma. No pathologic enhancement of the thoracic spine after contrast administration. Specifically, no evidence of epidural abscess, discitis, or osteomyelitis.  Bilateral consolidative infiltrates in both lower lobes consistent with pneumonia.  MR LUMBAR SPINE IMPRESSION  Progressive osteomyelitis and discitis at L2-3 with a new small ventral epidural abscess at L2-3 extending into the right neural foramen.  New enhancement of the psoas muscles bilaterally suggestive of myositis. No discrete  Improved discitis and osteomyelitis  at L3-4.  Critical Value/emergent results were called by telephone at the time of interpretation on 08/13/2014 at 4:41 pm to Dr. Colin Rhein, who verbally acknowledged these results.   Electronically Signed   By: Lorriane Shire M.D.   On: 08/13/2014 16:11    Labs:  CBC:  Recent Labs  06/18/14 1331 08/07/14 1450 08/13/14 0804 08/14/14 0314  WBC 7.0 7.3 10.3 10.5  HGB 12.3 12.3 12.1 9.8*  HCT 38.7 39.4 36.7 31.0*  PLT 395 427* 339 264    COAGS:  Recent Labs  08/14/14 0314  INR 1.15    BMP:  Recent Labs  05/08/14 1310 08/07/14 1450 08/13/14 0804 08/14/14 0314  NA 141 141 136 140  K 3.4* 4.1 4.3 4.0  CL 107 107 99* 106  CO2 '28 29 25 28  ' GLUCOSE 99 73 126* 103*  BUN '13 17 14 8  ' CALCIUM 9.7 9.9 10.1 9.7  CREATININE 0.64 0.68 0.70 0.51  GFRNONAA >90 >60 >60 >60  GFRAA >90 >60 >60 >60    LIVER FUNCTION TESTS:  Recent Labs  02/05/14 1029 03/19/14 1005 05/08/14 1310 08/07/14 1450  BILITOT 0.4 0.6 0.4 0.2*  AST '29 20 25 21  ' ALT '11 12 19 ' 10*  ALKPHOS 63 68 90 85  PROT 6.4  7.0 6.5  6.6 6.9 7.4  ALBUMIN 3.5 3.7 3.7 4.1    TUMOR MARKERS: No results for input(s): AFPTM, CEA, CA199, CHROMGRNA in the last 8760 hours.  Assessment and Plan:  Severe back pain L2-3 discitis Previous MRSA epid abscess post lumbar surgery 1 yr ago Scheduled now for disc aspiration Risks and Benefits discussed with the patient including, but not limited to bleeding, infection, damage to adjacent structures or low yield requiring additional tests. All of the patient's questions were answered, patient is agreeable to proceed. Consent signed and in chart.   Thank you for this interesting consult.  I greatly enjoyed meeting Autumn Duran and look forward to participating in their care.  Signed: Jarell Mcewen A 08/14/2014, 8:32 AM   I spent a total of 40 min   in face to face in clinical consultation, greater than 50% of which was counseling/coordinating care for L2-3 disc  asp

## 2014-08-14 NOTE — Procedures (Signed)
L2/3 Disk aspiration 1 cc pus No comp/EBL

## 2014-08-14 NOTE — Consult Note (Signed)
ANTIBIOTIC CONSULT NOTE - INITIAL  Pharmacy Consult :  Vancomycin Indication : Osteomyelitis of Lumbar Spine  Allergies  Allergen Reactions  . Morphine And Related Other (See Comments)    Headache  . Tetracyclines & Related Nausea And Vomiting    Vancomycin Dosing Weight : 68 kg  VITALS: Temp: 98.5 F (36.9 C) (06/17 1227) Temp Source: Oral (06/17 1227) BP: 135/65 mmHg (06/17 1424) Pulse Rate: 80 (06/17 1227)   Recent Labs  08/13/14 0804 08/14/14 0314  WBC 10.3 10.5  HGB 12.1 9.8*  PLT 339 264  CREATININE 0.70 0.51    MICRO: Recent Results (from the past 720 hour(s))  MRSA PCR Screening     Status: Abnormal   Collection Time: 08/13/14  8:58 PM  Result Value Ref Range Status   MRSA by PCR POSITIVE (A) NEGATIVE Final    Comment:        The GeneXpert MRSA Assay (FDA approved for NASAL specimens only), is one component of a comprehensive MRSA colonization surveillance program. It is not intended to diagnose MRSA infection nor to guide or monitor treatment for MRSA infections. RESULT CALLED TO, READ BACK BY AND VERIFIED WITH: Derrel Nip [id number] SKEENP/WILDERK     ASSESSMENT: 64 y.o. 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  Pharmacy to continue Vancomycin for treatment of MRSA osteomyelitis.  Recently on prolonged therapy with Vancomycin and Doxycycline. Patient received single doses of Vancomycin and Zosyn yesterday.  MEDICATION: Home Medication: Prescriptions prior to admission  Medication Sig Dispense Refill Last Dose  . diazepam (VALIUM) 5 MG tablet Take 1 tablet (5 mg total) by mouth every 8 (eight) hours as needed for anxiety. 80 tablet 0 Past Week at Unknown time  . furosemide (LASIX) 40 MG tablet Take 40 mg by mouth daily as needed for fluid.    Past Week at Unknown time  . HYDROcodone-acetaminophen  (NORCO) 10-325 MG per tablet Take 1 or 2 tablets every 4 hours to control pain 120 tablet 0 08/12/2014 at Unknown time  . ibuprofen (ADVIL,MOTRIN) 800 MG tablet Take 1 tablet (800 mg total) by mouth every 8 (eight) hours as needed for mild pain. 60 tablet 1 08/12/2014 at Unknown time  . magnesium hydroxide (MILK OF MAGNESIA) 400 MG/5ML suspension Take 30 mLs by mouth daily as needed for mild constipation.   08/12/2014 at Unknown time  . omeprazole (PRILOSEC) 40 MG capsule Take 40 mg by mouth daily as needed (acid reflux).    08/12/2014 at Unknown time  . ondansetron (ZOFRAN) 4 MG tablet Take 1 tablet (4 mg total) by mouth every 8 (eight) hours as needed for nausea or vomiting. 20 tablet 1 Past Week at Unknown time  . OxyCODONE (OXYCONTIN) 20 mg T12A 12 hr tablet Take 1 tablet (20 mg total) by mouth every 12 (twelve) hours. 60 tablet 0 Past Week at Unknown time  . potassium chloride SA (K-DUR,KLOR-CON) 20 MEQ tablet Take 20 mEq by mouth daily as needed (supplement).   1 Past Week at Unknown time  . RABEprazole (ACIPHEX) 20 MG tablet Take 1 tablet (20 mg total) by mouth daily. 30 tablet 3 Past Week at Unknown time  . senna (SENOKOT) 8.6 MG tablet Take 1 tablet by mouth as needed.    Past Week at Unknown time  . tobramycin-dexamethasone (TOBRADEX) ophthalmic solution Place 1 drop into the left eye every 4 (four)  hours while awake.   0 Past Week at Unknown time   Scheduled:  Scheduled:  . Chlorhexidine Gluconate Cloth  6 each Topical Q0600  . mupirocin ointment  1 application Nasal BID  . OxyCODONE  20 mg Oral Q12H  . pantoprazole  40 mg Oral Daily  . tobramycin-dexamethasone  1 drop Left Eye Q4H while awake   Antibiotic[s]: Anti-infectives    Start     Dose/Rate Route Frequency Ordered Stop   08/13/14 1800  vancomycin (VANCOCIN) 1,500 mg in sodium chloride 0.9 % 500 mL IVPB     1,500 mg 250 mL/hr over 120 Minutes Intravenous  Once 08/13/14 1721 08/13/14 2035   08/13/14 1730  piperacillin-tazobactam  (ZOSYN) IVPB 3.375 g     3.375 g 100 mL/hr over 30 Minutes Intravenous  Once 08/13/14 1721 08/13/14 1835     GOAL:  Vancomycin Trough 15 - 20 mcg/ml  PLAN: 1. Begin Vancomycin 750 mg IV q 12 hours. 2. Monitor renal function, WBC, fever curve, any cultures/sensitivities, Steady state Vancomycin levels as clinically indicated, and monitor clinical progression.  Shrihan Putt, Craig Guess, Pharm. D. 08/14/2014,3:28 PM

## 2014-08-14 NOTE — Consult Note (Signed)
[facility]     Reason for Consult: discitis, myositis    Referring Physician: Dr. Ree Kida  Principal Problem:   Epidural abscess, L2-L5 Active Problems:   Plasmacytoma of bone   Osteomyelitis of lumbar spine   Hypertension   Acute low back pain   Epidural abscess   . Chlorhexidine Gluconate Cloth  6 each Topical Q0600  . mupirocin ointment  1 application Nasal BID  . OxyCODONE  20 mg Oral Q12H  . pantoprazole  40 mg Oral Daily  . 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  Also with psoas muslce myositis.   She may be getting nausea due to pain medications.    Dr. Tommy Medal to see over the weekend.   Antibiotics: Received vancomycin and zosyn in ED  HPI: Autumn Duran is a 64 y.o. female with hsitory of disc osteomyelitis treated and now with recurrent pain and MRI findings consistent with L2-3 osteomyelitis and psoas myositis.  Has had progressive pain of the last several weeks, worsening over the last week.  Was evaluated by Dr. Megan Salon with inflammatory markers due to pain in May and ESR and CRP were normal.  No fever, no chills.  Has had significant nausea and has been taking more pain medications.     Review of Systems: A comprehensive review of systems was negative.  Past Medical History  Diagnosis Date  . Diverticulosis   . Hemorrhoids   . Hx of adenomatous colonic polyps   . Hiatal hernia   . Hypertension   . Hyperlipidemia   . GERD (gastroesophageal reflux disease)   . Chronic kidney disease     kidney stone s/p stent placement ( removed)  . Complication of anesthesia   . PONV (postoperative nausea and vomiting)   . Bronchitis, chronic   .  Chronic back pain   . Lumbar radicular pain   . Compression fracture of L3 lumbar vertebra   . T7 vertebral fracture   . Smoldering myeloma   . Sore throat     History  Substance Use Topics  . Smoking status: Never Smoker   . Smokeless tobacco: Never Used  . Alcohol Use: Yes     Comment: social occ    Family History  Problem Relation Age of Onset  . Breast cancer Sister 52  . Breast cancer Other 14    aunt   Allergies  Allergen Reactions  . Morphine And Related Other (See Comments)    Headache  . Tetracyclines & Related Nausea And Vomiting    OBJECTIVE: Blood pressure 135/65, pulse 80, temperature 98.5 F (36.9 C), temperature source Oral, resp. rate 16, height _0  (1.549 m), weight 150 lb (68.04 kg), SpO2 84 %. General: awake, mild distress due to nausea Skin: no rashes Lungs: CTA B Cor: RRR Abdomen: soft, nt   Microbiology: Recent Results (from the past 240 hour(s))  MRSA PCR Screening     Status: Abnormal   Collection Time: 08/13/14  8:58 PM  Result Value Ref Range Status   MRSA by PCR POSITIVE (A) NEGATIVE Final    Comment:        The GeneXpert MRSA Assay (FDA approved for NASAL specimens only), is one  component of a comprehensive MRSA colonization surveillance program. It is not intended to diagnose MRSA infection nor to guide or monitor treatment for MRSA infections. RESULT CALLED TO, READ BACK BY AND VERIFIED WITH: Derrel Nip [id number] SKEENP/WILDERK     Scharlene Gloss, [email or web address] [phone] pager  [phone] cell 08/14/2014, 2:26 PM

## 2014-08-15 DIAGNOSIS — G062 Extradural and subdural abscess, unspecified: Secondary | ICD-10-CM

## 2014-08-15 DIAGNOSIS — I10 Essential (primary) hypertension: Secondary | ICD-10-CM

## 2014-08-15 DIAGNOSIS — Z8614 Personal history of Methicillin resistant Staphylococcus aureus infection: Secondary | ICD-10-CM

## 2014-08-15 DIAGNOSIS — M4646 Discitis, unspecified, lumbar region: Secondary | ICD-10-CM

## 2014-08-15 LAB — CBC
HCT: 31.4 % — ABNORMAL LOW (ref 36.0–46.0)
Hemoglobin: 9.8 g/dL — ABNORMAL LOW (ref 12.0–15.0)
MCH: 27.2 pg (ref 26.0–34.0)
MCHC: 31.2 g/dL (ref 30.0–36.0)
MCV: 87.2 fL (ref 78.0–100.0)
Platelets: 288 10*3/uL (ref 150–400)
RBC: 3.6 MIL/uL — ABNORMAL LOW (ref 3.87–5.11)
RDW: 13.5 % (ref 11.5–15.5)
WBC: 7.4 10*3/uL (ref 4.0–10.5)

## 2014-08-15 LAB — BASIC METABOLIC PANEL
Anion gap: 7 (ref 5–15)
BUN: 13 mg/dL (ref 6–20)
CO2: 27 mmol/L (ref 22–32)
Calcium: 9.7 mg/dL (ref 8.9–10.3)
Chloride: 105 mmol/L (ref 101–111)
Creatinine, Ser: 0.5 mg/dL (ref 0.44–1.00)
GFR calc Af Amer: >60 mL/min (ref >60)
GFR calc non Af Amer: >60 mL/min (ref >60)
Glucose, Bld: 77 mg/dL (ref 65–99)
Potassium: 3.9 mmol/L (ref 3.5–5.1)
Sodium: 139 mmol/L (ref 135–145)

## 2014-08-15 NOTE — Progress Notes (Signed)
[facility]                                                                              Patient Demographics  Autumn Duran, is a 64 y.o. female, DOB - 11/28/1950, CHE:[id number]  Admit date - 08/13/2014   Admitting Physician Phillips Grout, MD  Outpatient Primary MD for the patient is [name],[name], MD  LOS - 2   Chief Complaint  Patient presents with  . Back Pain      HPI on 08/13/2014 by Dr. 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 Has been following with ID as outpatient. Was taken off doxycycline several months ago, but her back pain returned more severe she put herself back on the doxy for a couple of weeks (over a month ago). Pt has had severe lower back pain over the last week,not controlled with her home pain meds. Denies any fevers. No n/t/or weakness in her legs but unable to walk due to pain. Denies any saddle anesthesia or urinary/bowel incontinence. Repeat mri today shows epidural absess and progression of her osteo c/w previous mri.  Assessment & Plan   Back pain/Epidural Abscess/osteomyelitis/Discitis -MRI: Aggressive osteomyelitis and discitis L2-3, new small ventral epidural abscess L2-3. New enhancement of the sella as muscle suggestive of mild ascites. Improved discitis and osteomyelitis at L3-4. -Infectious disease consulted and appreciated -Neurosurgery consulted and appreciated -Interventional radiology consulted and appreciated -S/p Fluoro-guided L2/3 disk aspiration - 1cc pus -Patient had MRSA epidural abscess 1 year ago s/p lumbar surgery -She was treated with vancomycin and followed by course of doxycycline -Patient received 1 dose of vanc/zosyn in the ER -Continue vancomycin -Continue pain control -Culture: No AFB seen, no growth, no yeast or fungal elements  Hypertension -Currently stable, on  no home medications  GERD -Continue Protonix  Chronic anemia -Appears to be microcytic and normocytic -Baseline hemoglobin approximately 10-11, currently 9.8 -Will continue to monitor CBC  Code Status: Full  Family Communication: Family at bedside  Disposition Plan: Admitted, Continue vancomycin  Time Spent in minutes   30 minutes  Procedures  Disc aspiration  Consults   Interventional radiology Infectious disease Neurosurgery  DVT Prophylaxis  SCDs  Lab Results  Component Value Date   PLT 288 08/15/2014    Medications  Scheduled Meds: . Chlorhexidine Gluconate Cloth  6 each Topical Q0600  . mupirocin ointment  1 application Nasal BID  . OxyCODONE  20 mg Oral Q12H  . pantoprazole  40 mg Oral Daily  . tobramycin-dexamethasone  1 drop Left Eye Q4H while awake  . vancomycin (VANCOCIN) 750 mg IVPB  750 mg Intravenous Q12H   Continuous Infusions:  PRN Meds:.diazepam, HYDROmorphone (DILAUDID) injection, ondansetron **OR** ondansetron (ZOFRAN) IV, senna  Antibiotics    Anti-infectives    Start     Dose/Rate Route Frequency Ordered Stop   08/14/14 1600  vancomycin (VANCOCIN) 750 mg in sodium chloride 0.9 % 150 mL IVPB     750 mg 150 mL/hr over 60 Minutes Intravenous Every 12 hours 08/14/14 1537  08/13/14 1800  vancomycin (VANCOCIN) 1,500 mg in sodium chloride 0.9 % 500 mL IVPB     1,500 mg 250 mL/hr over 120 Minutes Intravenous  Once 08/13/14 1721 08/13/14 2035   08/13/14 1730  piperacillin-tazobactam (ZOSYN) IVPB 3.375 g     3.375 g 100 mL/hr over 30 Minutes Intravenous  Once 08/13/14 1721 08/13/14 1835        Subjective:   Autumn Duran seen and examined today.  Patient states she was able to sleep last night. Still complains of back pain.  Currently denies chest pain, shortness of breath, headache, dizziness.   Objective:   Filed Vitals:   08/14/14 1548 08/14/14 2130 08/15/14 0500 08/15/14 0620  BP: 130/70 127/52  116/48  Pulse: 78 77  70  Temp:  98.3 F (36.8 C) 97.9 F (36.6 C)  99.9 F (37.7 C)  TempSrc: Oral Oral  Oral  Resp: '18 18  18  '$ Height:      Weight:   69.945 kg (154 lb 3.2 oz)   SpO2: 94% 95%  97%    Wt Readings from Last 3 Encounters:  08/15/14 69.945 kg (154 lb 3.2 oz)  08/13/14 68.947 kg (152 lb)  08/07/14 69.037 kg (152 lb 3.2 oz)     Intake/Output Summary (Last 24 hours) at 08/15/14 1202 Last data filed at 08/15/14 0915  Gross per 24 hour  Intake   2135 ml  Output   1100 ml  Net   1035 ml    Exam  General: Well developed, well nourished, no distress, resting comfortably  HEENT: NCAT, mucous membranes moist.   Cardiovascular: S1 S2 auscultated, RRR, no murmurs  Respiratory: Clear to auscultation   Abdomen: Soft, nontender, nondistended, + bowel sounds  Extremities: warm dry without cyanosis clubbing or edema  Data Review   Micro Results Recent Results (from the past 240 hour(s))  MRSA PCR Screening     Status: Abnormal   Collection Time: 08/13/14  8:58 PM  Result Value Ref Range Status   MRSA by PCR POSITIVE (A) NEGATIVE Final    Comment:        The GeneXpert MRSA Assay (FDA approved for NASAL specimens only), is one component of a comprehensive MRSA colonization surveillance program. It is not intended to diagnose MRSA infection nor to guide or monitor treatment for MRSA infections. RESULT CALLED TO, READ BACK BY AND VERIFIED WITHDerrel Nip [id number] SKEENP/WILDERK   Fungus Culture with Smear     Status: None (Preliminary result)   Collection Time: 08/14/14 12:54 PM  Result Value Ref Range Status   Specimen Description FLUID  Final   Special Requests INTERVERTEBRAL DISC  Final   Fungal Smear   Final    NO YEAST OR FUNGAL ELEMENTS SEEN Performed at [facility]    Culture   Final    CULTURE IN PROGRESS FOR FOUR WEEKS Performed at [facility]    Report Status PENDING  Incomplete  AFB culture with smear     Status: None (Preliminary result)    Collection Time: 08/14/14 12:54 PM  Result Value Ref Range Status   Specimen Description FLUID  Final   Special Requests INTERVERTEBRAL DISC  Final   Acid Fast Smear   Final    NO ACID FAST BACILLI SEEN Performed at [facility]    Culture   Final    CULTURE WILL BE EXAMINED FOR 6 WEEKS BEFORE ISSUING A FINAL REPORT Performed at [facility]  Report Status PENDING  Incomplete  Culture, routine-abscess     Status: None (Preliminary result)   Collection Time: 08/14/14 12:54 PM  Result Value Ref Range Status   Specimen Description ABSCESS  Final   Special Requests DISK ASPIRATION L2/3  Final   Gram Stain   Final    ABUNDANT WBC PRESENT, PREDOMINANTLY PMN NO SQUAMOUS EPITHELIAL CELLS SEEN NO ORGANISMS SEEN Performed at [facility]    Culture   Final    NO GROWTH 1 DAY Performed at [facility]    Report Status PENDING  Incomplete    Radiology Reports Ct Head Wo Contrast  08/13/2014   CLINICAL DATA:  Headache with nausea vomiting 2 days  EXAM: CT HEAD WITHOUT CONTRAST  TECHNIQUE: Contiguous axial images were obtained from the base of the skull through the vertex without intravenous contrast.  COMPARISON:  None.  FINDINGS: Ventricle size normal. Negative for acute or chronic infarction. Negative for hemorrhage or mass. No edema or shift of the midline structures  Chronic sinusitis with mucosal edema in the paranasal sinuses and associated bony thickening of the maxillary sinus bilaterally. Air-fluid level left maxillary sinus. No skull lesion.  IMPRESSION: Negative CT of the head  Chronic sinusitis.   Electronically Signed   By: Franchot Gallo M.D.   On: 08/13/2014 09:10   Mr Thoracic Spine W Wo Contrast  08/13/2014   CLINICAL DATA:  Chronic progressive back pain. History of epidural abscess. Progressive headache. Bilateral leg pain.  EXAM: MRI THORACIC AND LUMBAR SPINE WITHOUT CONTRAST  TECHNIQUE: Multiplanar and multiecho pulse sequences of the  thoracic and lumbar spine were obtained without intravenous contrast.  COMPARISON:  None.  MRIs of the lumbar spine dated 02/24/2014 and 12/25/2013 and 09/21/2013 and CT scan of the chest dated 07/12/2013 and chest x-ray dated 10/17/2007  FINDINGS: MR THORACIC SPINE FINDINGS  There is chronic accentuation of the thoracic kyphosis with an old compression deformity of T7, unchanged since 2009. There is diffuse very low signal intensity from the vertebral bodies throughout the thoracic spine. This is most likely related to the patient's myeloma or possibly a red marrow reactivation secondary to anemia. The thoracic spinal cord is normal. There is no bone destruction. There is no myelopathy or mass lesion. There is consolidation in both lung bases, more extensive on the right than the left. Benign knee hemangiomata are present in T12 and L1. Normal conus tip at L1.  MR LUMBAR SPINE FINDINGS  Normal conus tip at L1.  T12-L1:  Normal disc.  Benign hemangiomata and T12 and L1.  Psoas muscles bilaterally.  L2-3: There is pus in the disc space and there is a small amount of pus that extends into the ventral epidural space with slight compression of the thecal sac. There is is new since the prior MRI. This inflammatory process extends into the right neural foramen more extensively than on the prior study and extends extra foraminal. There is gas in the disc space and in the superior aspect of the L3 vertebra as previously demonstrated.  There is diffuse abnormal enhancement of the psoas muscles after contrast administration. No discrete psoas abscesses however.  L3-4: The inflammatory changes at the L3-4 level have improved since the prior exam. There is slight enhancement in the disc space and of the endplates to a lesser degree. There is an unchanged right foraminal and extra foraminal disc protrusion.  L4-5:  The disc is normal.  No visible epidural enhancement.  L5-S1: Stable grade 1  spondylolisthesis with a diffuse  broad-based bulge of the uncovered disc. Postsurgical changes on the left. Enhancing scar tissue to the expected degree after contrast administration. No evidence of epidural abscess. Diminished enhancement of the left pedicle and facets at L5.  There is increased enhancement in the sacral spinal canal below the terminale shin of the thecal sac at S2. No discrete epidural abscess at that site.  IMPRESSION: MR THORACIC SPINE IMPRESSION  No acute abnormality of the thoracic spine.  Old compression deformity of T7.  Diffuse low signal from the bone marrow of the thoracic spine which could be due to red marrow reactivation or be associated with the patient's myeloma. No pathologic enhancement of the thoracic spine after contrast administration. Specifically, no evidence of epidural abscess, discitis, or osteomyelitis.  Bilateral consolidative infiltrates in both lower lobes consistent with pneumonia.  MR LUMBAR SPINE IMPRESSION  Progressive osteomyelitis and discitis at L2-3 with a new small ventral epidural abscess at L2-3 extending into the right neural foramen.  New enhancement of the psoas muscles bilaterally suggestive of myositis. No discrete  Improved discitis and osteomyelitis at L3-4.  Critical Value/emergent results were called by telephone at the time of interpretation on 08/13/2014 at 4:41 pm to Dr. Colin Rhein, who verbally acknowledged these results.   Electronically Signed   By: Lorriane Shire M.D.   On: 08/13/2014 16:11   Mr Lumbar Spine W Wo Contrast  08/13/2014   CLINICAL DATA:  Chronic progressive back pain. History of epidural abscess. Progressive headache. Bilateral leg pain.  EXAM: MRI THORACIC AND LUMBAR SPINE WITHOUT CONTRAST  TECHNIQUE: Multiplanar and multiecho pulse sequences of the thoracic and lumbar spine were obtained without intravenous contrast.  COMPARISON:  None.  MRIs of the lumbar spine dated 02/24/2014 and 12/25/2013 and 09/21/2013 and CT scan of the chest dated 07/12/2013 and chest  x-ray dated 10/17/2007  FINDINGS: MR THORACIC SPINE FINDINGS  There is chronic accentuation of the thoracic kyphosis with an old compression deformity of T7, unchanged since 2009. There is diffuse very low signal intensity from the vertebral bodies throughout the thoracic spine. This is most likely related to the patient's myeloma or possibly a red marrow reactivation secondary to anemia. The thoracic spinal cord is normal. There is no bone destruction. There is no myelopathy or mass lesion. There is consolidation in both lung bases, more extensive on the right than the left. Benign knee hemangiomata are present in T12 and L1. Normal conus tip at L1.  MR LUMBAR SPINE FINDINGS  Normal conus tip at L1.  T12-L1:  Normal disc.  Benign hemangiomata and T12 and L1.  Psoas muscles bilaterally.  L2-3: There is pus in the disc space and there is a small amount of pus that extends into the ventral epidural space with slight compression of the thecal sac. There is is new since the prior MRI. This inflammatory process extends into the right neural foramen more extensively than on the prior study and extends extra foraminal. There is gas in the disc space and in the superior aspect of the L3 vertebra as previously demonstrated.  There is diffuse abnormal enhancement of the psoas muscles after contrast administration. No discrete psoas abscesses however.  L3-4: The inflammatory changes at the L3-4 level have improved since the prior exam. There is slight enhancement in the disc space and of the endplates to a lesser degree. There is an unchanged right foraminal and extra foraminal disc protrusion.  L4-5:  The disc is normal.  No visible epidural  enhancement.  L5-S1: Stable grade 1 spondylolisthesis with a diffuse broad-based bulge of the uncovered disc. Postsurgical changes on the left. Enhancing scar tissue to the expected degree after contrast administration. No evidence of epidural abscess. Diminished enhancement of the left  pedicle and facets at L5.  There is increased enhancement in the sacral spinal canal below the terminale shin of the thecal sac at S2. No discrete epidural abscess at that site.  IMPRESSION: MR THORACIC SPINE IMPRESSION  No acute abnormality of the thoracic spine.  Old compression deformity of T7.  Diffuse low signal from the bone marrow of the thoracic spine which could be due to red marrow reactivation or be associated with the patient's myeloma. No pathologic enhancement of the thoracic spine after contrast administration. Specifically, no evidence of epidural abscess, discitis, or osteomyelitis.  Bilateral consolidative infiltrates in both lower lobes consistent with pneumonia.  MR LUMBAR SPINE IMPRESSION  Progressive osteomyelitis and discitis at L2-3 with a new small ventral epidural abscess at L2-3 extending into the right neural foramen.  New enhancement of the psoas muscles bilaterally suggestive of myositis. No discrete  Improved discitis and osteomyelitis at L3-4.  Critical Value/emergent results were called by telephone at the time of interpretation on 08/13/2014 at 4:41 pm to Dr. Colin Rhein, who verbally acknowledged these results.   Electronically Signed   By: Lorriane Shire M.D.   On: 08/13/2014 16:11   Ir Fluoro Guide Ndl Plmt / Bx  08/14/2014   CLINICAL DATA:  L2-3 discitis  EXAM: IR FLUORO GUIDE NEEDLE PLACEMENT /BIOPSY  FLUOROSCOPY TIME:  2 minutes and 24 seconds  MEDICATIONS AND MEDICAL HISTORY: Versed 1 mg, Fentanyl none mcg.  Additional Medications: None.  ANESTHESIA/SEDATION: Moderate sedation time: 15 minutes  CONTRAST:  None  PROCEDURE: The procedure, risks, benefits, and alternatives were explained to the patient. Questions regarding the procedure were encouraged and answered. The patient understands and consents to the procedure.  The back was prepped with Betadine in a sterile fashion, and a sterile drape was applied covering the operative field. A sterile gown and sterile gloves were used  for the procedure.  Under fluoroscopic guidance, an 18 gauge needle was inserted into the L2-3 disc via a right posterior lateral approach. Aspiration yielded a cc of pus.  FINDINGS: Image documents needle placement in the W9-7 disc  COMPLICATIONS: None  IMPRESSION: Successful L2-3 disc aspiration.   Electronically Signed   By: Marybelle Killings M.D.   On: 08/14/2014 13:24    CBC  Recent Labs Lab 08/13/14 0804 08/14/14 0314 08/15/14 0333  WBC 10.3 10.5 7.4  HGB 12.1 9.8* 9.8*  HCT 36.7 31.0* 31.4*  PLT 339 264 288  MCV 84.2 87.3 87.2  MCH 27.8 27.6 27.2  MCHC 33.0 31.6 31.2  RDW 13.7 14.1 13.5  LYMPHSABS 1.0  --   --   MONOABS 0.2  --   --   EOSABS 0.0  --   --   BASOSABS 0.0  --   --     Chemistries   Recent Labs Lab 08/13/14 0804 08/14/14 0314 08/15/14 0333  NA 136 140 139  K 4.3 4.0 3.9  CL 99* 106 105  CO2 '25 28 27  '$ GLUCOSE 126* 103* 77  BUN '14 8 13  '$ CREATININE 0.70 0.51 0.50  CALCIUM 10.1 9.7 9.7   ------------------------------------------------------------------------------------------------------------------ estimated creatinine clearance is 64.3 mL/min (by C-G formula based on Cr of 0.5). ------------------------------------------------------------------------------------------------------------------ No results for input(s): HGBA1C in the last 72 hours. ------------------------------------------------------------------------------------------------------------------ No results  for input(s): CHOL, HDL, LDLCALC, TRIG, CHOLHDL, LDLDIRECT in the last 72 hours. ------------------------------------------------------------------------------------------------------------------ No results for input(s): TSH, T4TOTAL, T3FREE, THYROIDAB in the last 72 hours.  Invalid input(s): FREET3 ------------------------------------------------------------------------------------------------------------------ No results for input(s): VITAMINB12, FOLATE, FERRITIN, TIBC, IRON, RETICCTPCT  in the last 72 hours.  Coagulation profile  Recent Labs Lab 08/14/14 0314  INR 1.15    No results for input(s): DDIMER in the last 72 hours.  Cardiac Enzymes No results for input(s): CKMB, TROPONINI, MYOGLOBIN in the last 168 hours.  Invalid input(s): CK ------------------------------------------------------------------------------------------------------------------ Invalid input(s): POCBNP    Autumn Duran D.O. on 08/15/2014 at 12:02 PM  Between 7am to 7pm - Pager - [phone]  After 7pm go to [email or web address] - password [email or web address]  And look for the night coverage person covering for me after hours  [facility] Office  [phone]

## 2014-08-16 ENCOUNTER — Encounter (HOSPITAL_COMMUNITY): Payer: Self-pay | Admitting: Hematology & Oncology

## 2014-08-16 DIAGNOSIS — A4902 Methicillin resistant Staphylococcus aureus infection, unspecified site: Secondary | ICD-10-CM | POA: Insufficient documentation

## 2014-08-16 LAB — VANCOMYCIN, TROUGH: Vancomycin Tr: 15 ug/mL (ref 10.0–20.0)

## 2014-08-16 MED ORDER — SENNOSIDES-DOCUSATE SODIUM 8.6-50 MG PO TABS
1.0000 | ORAL_TABLET | Freq: Two times a day (BID) | ORAL | Status: DC
  Administered 2014-08-16 – 2014-08-18 (×5): 1 via ORAL
  Filled 2014-08-16 (×5): qty 1

## 2014-08-16 MED ORDER — MAGNESIUM HYDROXIDE 400 MG/5ML PO SUSP
30.0000 mL | Freq: Once | ORAL | Status: AC
  Administered 2014-08-16: 30 mL via ORAL
  Filled 2014-08-16: qty 30

## 2014-08-16 MED ORDER — VANCOMYCIN HCL IN DEXTROSE 1-5 GM/200ML-% IV SOLN
1000.0000 mg | Freq: Two times a day (BID) | INTRAVENOUS | Status: DC
  Administered 2014-08-17 – 2014-08-18 (×3): 1000 mg via INTRAVENOUS
  Filled 2014-08-16 (×6): qty 200

## 2014-08-16 NOTE — Progress Notes (Signed)
[facility]    Subjective: No new complaints   Antibiotics:  Anti-infectives    Start     Dose/Rate Route Frequency Ordered Stop   08/14/14 1600  vancomycin (VANCOCIN) 750 mg in sodium chloride 0.9 % 150 mL IVPB     750 mg 150 mL/hr over 60 Minutes Intravenous Every 12 hours 08/14/14 1537     08/13/14 1800  vancomycin (VANCOCIN) 1,500 mg in sodium chloride 0.9 % 500 mL IVPB     1,500 mg 250 mL/hr over 120 Minutes Intravenous  Once 08/13/14 1721 08/13/14 2035   08/13/14 1730  piperacillin-tazobactam (ZOSYN) IVPB 3.375 g     3.375 g 100 mL/hr over 30 Minutes Intravenous  Once 08/13/14 1721 08/13/14 1835      Medications: Scheduled Meds: . Chlorhexidine Gluconate Cloth  6 each Topical Q0600  . mupirocin ointment  1 application Nasal BID  . OxyCODONE  20 mg Oral Q12H  . pantoprazole  40 mg Oral Daily  . senna-docusate  1 tablet Oral BID  . tobramycin-dexamethasone  1 drop Left Eye Q4H while awake  . vancomycin (VANCOCIN) 750 mg IVPB  750 mg Intravenous Q12H   Continuous Infusions:  PRN Meds:.diazepam, HYDROmorphone (DILAUDID) injection, ondansetron **OR** ondansetron (ZOFRAN) IV    Objective: Weight change:   Intake/Output Summary (Last 24 hours) at 08/16/14 1509 Last data filed at 08/16/14 1415  Gross per 24 hour  Intake    840 ml  Output    250 ml  Net    590 ml   Blood pressure 127/72, pulse 67, temperature 98.5 F (36.9 C), temperature source Oral, resp. rate 16, height '5\' 1"'$  (1.549 m), weight 154 lb 3.2 oz (69.945 kg), SpO2 97 %. Temp:  [97.3 F (36.3 C)-98.7 F (37.1 C)] 98.5 F (36.9 C) (06/19 1414) Pulse Rate:  [63-67] 67 (06/19 1414) Resp:  [14-16] 16 (06/19 1414) BP: (109-127)/(49-72) 127/72 mmHg (06/19 1414) SpO2:  [97 %-100 %] 97 % (06/19 1414)  Physical Exam: General: Alert and awake, oriented x3, not in any acute distress. HEENT: anicteric sclera, pupils reactive to light and accommodation, EOMI CVS regular  rate,  Chest:, no wheezing, rales or rhonchi Abdomen: softnondistended, normal bowel sounds, Extremities: no  clubbing or edema noted bilaterally Skin: no rashes Neuro: nonfocal  CBC: CBC Latest Ref Rng 08/15/2014 08/14/2014 08/13/2014  WBC 4.0 - 10.5 K/uL 7.4 10.5 10.3  Hemoglobin 12.0 - 15.0 g/dL 9.8(L) 9.8(L) 12.1  Hematocrit 36.0 - 46.0 % 31.4(L) 31.0(L) 36.7  Platelets 150 - 400 K/uL 288 264 339       BMET  Recent Labs  08/14/14 0314 08/15/14 0333  NA 140 139  K 4.0 3.9  CL 106 105  CO2 28 27  GLUCOSE 103* 77  BUN 8 13  CREATININE 0.51 0.50  CALCIUM 9.7 9.7     Liver Panel  No results for input(s): PROT, ALBUMIN, AST, ALT, ALKPHOS, BILITOT, BILIDIR, IBILI in the last 72 hours.     Sedimentation Rate No results for input(s): ESRSEDRATE in the last 72 hours. C-Reactive Protein No results for input(s): CRP in the last 72 hours.  Micro Results: Recent Results (from the past 720 hour(s))  MRSA PCR Screening     Status: Abnormal   Collection Time: 08/13/14  8:58 PM  Result Value Ref Range Status   MRSA by PCR POSITIVE (A) NEGATIVE Final    Comment:        The GeneXpert  MRSA Assay (FDA approved for NASAL specimens only), is one component of a comprehensive MRSA colonization surveillance program. It is not intended to diagnose MRSA infection nor to guide or monitor treatment for MRSA infections. RESULT CALLED TO, READ BACK BY AND VERIFIED WITH: Derrel Nip [id number] SKEENP/WILDERK   Fungus Culture with Smear     Status: None (Preliminary result)   Collection Time: 08/14/14 12:54 PM  Result Value Ref Range Status   Specimen Description FLUID  Final   Special Requests INTERVERTEBRAL DISC  Final   Fungal Smear   Final    NO YEAST OR FUNGAL ELEMENTS SEEN Performed at [facility]    Culture   Final    CULTURE IN PROGRESS FOR FOUR WEEKS Performed at [facility]    Report Status PENDING  Incomplete  AFB culture with smear      Status: None (Preliminary result)   Collection Time: 08/14/14 12:54 PM  Result Value Ref Range Status   Specimen Description FLUID  Final   Special Requests INTERVERTEBRAL DISC  Final   Acid Fast Smear   Final    NO ACID FAST BACILLI SEEN Performed at [facility]    Culture   Final    CULTURE WILL BE EXAMINED FOR 6 WEEKS BEFORE ISSUING A FINAL REPORT Performed at [facility]    Report Status PENDING  Incomplete  Culture, routine-abscess     Status: None (Preliminary result)   Collection Time: 08/14/14 12:54 PM  Result Value Ref Range Status   Specimen Description ABSCESS  Final   Special Requests DISK ASPIRATION L2/3  Final   Gram Stain   Final    ABUNDANT WBC PRESENT, PREDOMINANTLY PMN NO SQUAMOUS EPITHELIAL CELLS SEEN NO ORGANISMS SEEN Performed at [facility]    Culture   Final    Culture reincubated for better growth Performed at [facility]    Report Status PENDING  Incomplete    Studies/Results: No results found.    Assessment/Plan:  Principal Problem:   Epidural abscess, L2-L5 Active Problems:   Plasmacytoma of bone   Osteomyelitis of lumbar spine   Hypertension   Acute low back pain   Epidural abscess    Autumn Duran is a 64 y.o. female with  Hx of MRSA diskitis, osteomyelitis readmitted with MRI findings c/w L2-3 osteomyelitis and diskitis, unfortunately given vancomycin/zosyn PRIOR to aspirate being obtained  Cultures NGTD so far  Agree with continued vancomycin, followup culture data  If cultures no grwoth would retreat for MRSA with protracted IV 8 week course followed by a year of oral abx Neuro signs symptoms need to be monitored  Dr. Linus Salmons is back on Monday.   LOS: 3 days   Autumn Duran 08/16/2014, 3:09 PM

## 2014-08-16 NOTE — Progress Notes (Signed)
[facility]                                                                              Patient Demographics  Autumn Duran, is a 64 y.o. female, DOB - 12-Aug-1950, EOF:[id number]  Admit date - 08/13/2014   Admitting Physician Phillips Grout, MD  Outpatient Primary MD for the patient is Autumn B., MD  LOS - 3   Chief Complaint  Patient presents with  . Back Pain      HPI on 08/13/2014 by Dr. 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 Has been following with ID as outpatient. Was taken off doxycycline several months ago, but her back pain returned more severe she put herself back on the doxy for a couple of weeks (over a month ago). Pt has had severe lower back pain over the last week,not controlled with her home pain meds. Denies any fevers. No n/t/or weakness in her legs but unable to walk due to pain. Denies any saddle anesthesia or urinary/bowel incontinence. Repeat mri today shows epidural absess and progression of her osteo c/w previous mri.  Assessment & Plan   Back pain/Epidural Abscess/osteomyelitis/Discitis -MRI: Aggressive osteomyelitis and discitis L2-3, new small ventral epidural abscess L2-3. New enhancement of the sella as muscle suggestive of mild ascites. Improved discitis and osteomyelitis at L3-4. -Infectious disease consulted and appreciated -Neurosurgery consulted and appreciated -Interventional radiology consulted and appreciated -S/p Fluoro-guided L2/3 disk aspiration - 1cc pus -Patient had MRSA epidural abscess 1 year ago s/p lumbar surgery -She was treated with vancomycin and followed by course of doxycycline -Patient received 1 dose of vanc/zosyn in the ER -Continue vancomycin- will speak with ID/neurosurgery regarding length of treatment on 6/20 -Continue pain control -Culture: No AFB seen, no  growth, no yeast or fungal elements -Will consult PT for evaluation   Hypertension -Currently stable, on no home medications  GERD -Continue Protonix  Chronic anemia -Appears to be microcytic and normocytic -Baseline hemoglobin approximately 10-11, currently 9.8 -Will continue to monitor CBC  Code Status: Full  Family Communication: None at bedside  Disposition Plan: Admitted, Continue vancomycin. Pending PT eval.  Time Spent in minutes   30 minutes  Procedures  Disc aspiration  Consults   Interventional radiology Infectious disease Neurosurgery  DVT Prophylaxis  SCDs  Lab Results  Component Value Date   PLT 288 08/15/2014    Medications  Scheduled Meds: . Chlorhexidine Gluconate Cloth  6 each Topical Q0600  . mupirocin ointment  1 application Nasal BID  . OxyCODONE  20 mg Oral Q12H  . pantoprazole  40 mg Oral Daily  . senna-docusate  1 tablet Oral BID  . tobramycin-dexamethasone  1 drop Left Eye Q4H while awake  . vancomycin (VANCOCIN) 750 mg IVPB  750 mg Intravenous Q12H   Continuous Infusions:  PRN Meds:.diazepam, HYDROmorphone (DILAUDID) injection, ondansetron **OR** ondansetron (ZOFRAN) IV  Antibiotics    Anti-infectives    Start     Dose/Rate Route Frequency Ordered Stop   08/14/14 1600  vancomycin (VANCOCIN) 750 mg in sodium chloride  0.9 % 150 mL IVPB     750 mg 150 mL/hr over 60 Minutes Intravenous Every 12 hours 08/14/14 1537     08/13/14 1800  vancomycin (VANCOCIN) 1,500 mg in sodium chloride 0.9 % 500 mL IVPB     1,500 mg 250 mL/hr over 120 Minutes Intravenous  Once 08/13/14 1721 08/13/14 2035   08/13/14 1730  piperacillin-tazobactam (ZOSYN) IVPB 3.375 g     3.375 g 100 mL/hr over 30 Minutes Intravenous  Once 08/13/14 1721 08/13/14 1835        Subjective:   Autumn Duran seen and examined today.  Patient feels slightly better, but states the bed is not comfortable.  Denies chest pain, shortness of breath, headache, dizziness.    Objective:   Filed Vitals:   08/15/14 0500 08/15/14 0620 08/15/14 2129 08/16/14 0423  BP:  116/48 109/49 125/68  Pulse:  70 63 67  Temp:  99.9 F (37.7 C) 97.3 F (36.3 C) 98.7 F (37.1 C)  TempSrc:  Oral Axillary Oral  Resp:  '18 14 16  '$ Height:      Weight: 69.945 kg (154 lb 3.2 oz)     SpO2:  97% 99% 100%    Wt Readings from Last 3 Encounters:  08/15/14 69.945 kg (154 lb 3.2 oz)  08/13/14 68.947 kg (152 lb)  08/07/14 69.037 kg (152 lb 3.2 oz)     Intake/Output Summary (Last 24 hours) at 08/16/14 1146 Last data filed at 08/16/14 0900  Gross per 24 hour  Intake    720 ml  Output    250 ml  Net    470 ml    Exam  General: Well developed, well nourished, NAD  HEENT: NCAT, mucous membranes moist.   Cardiovascular: S1 S2 auscultated, RRR, no murmurs  Respiratory: Clear to auscultation   Abdomen: Soft, nontender, nondistended, + bowel sounds  Extremities: warm dry without cyanosis clubbing or edema  Neuro: AAOx3, nonfocal  Psych: appropriate mood and affect  Data Review   Micro Results Recent Results (from the past 240 hour(s))  MRSA PCR Screening     Status: Abnormal   Collection Time: 08/13/14  8:58 PM  Result Value Ref Range Status   MRSA by PCR POSITIVE (A) NEGATIVE Final    Comment:        The GeneXpert MRSA Assay (FDA approved for NASAL specimens only), is one component of a comprehensive MRSA colonization surveillance program. It is not intended to diagnose MRSA infection nor to guide or monitor treatment for MRSA infections. RESULT CALLED TO, READ BACK BY AND VERIFIED WITHDerrel Nip [id number] SKEENP/WILDERK   Fungus Culture with Smear     Status: None (Preliminary result)   Collection Time: 08/14/14 12:54 PM  Result Value Ref Range Status   Specimen Description FLUID  Final   Special Requests INTERVERTEBRAL DISC  Final   Fungal Smear   Final    NO YEAST OR FUNGAL ELEMENTS SEEN Performed at [facility]    Culture    Final    CULTURE IN PROGRESS FOR FOUR WEEKS Performed at [facility]    Report Status PENDING  Incomplete  AFB culture with smear     Status: None (Preliminary result)   Collection Time: 08/14/14 12:54 PM  Result Value Ref Range Status   Specimen Description FLUID  Final   Special Requests INTERVERTEBRAL DISC  Final   Acid Fast Smear   Final    NO ACID FAST BACILLI SEEN Performed at [facility]    Culture   Final    CULTURE WILL BE EXAMINED FOR 6 WEEKS BEFORE ISSUING A FINAL REPORT Performed at [facility]    Report Status PENDING  Incomplete  Culture, routine-abscess     Status: None (Preliminary result)   Collection Time: 08/14/14 12:54 PM  Result Value Ref Range Status   Specimen Description ABSCESS  Final   Special Requests DISK ASPIRATION L2/3  Final   Gram Stain   Final    ABUNDANT WBC PRESENT, PREDOMINANTLY PMN NO SQUAMOUS EPITHELIAL CELLS SEEN NO ORGANISMS SEEN Performed at [facility]    Culture   Final    Culture reincubated for better growth Performed at [facility]    Report Status PENDING  Incomplete    Radiology Reports Ct Head Wo Contrast  08/13/2014   CLINICAL DATA:  Headache with nausea vomiting 2 days  EXAM: CT HEAD WITHOUT CONTRAST  TECHNIQUE: Contiguous axial images were obtained from the base of the skull through the vertex without intravenous contrast.  COMPARISON:  None.  FINDINGS: Ventricle size normal. Negative for acute or chronic infarction. Negative for hemorrhage or mass. No edema or shift of the midline structures  Chronic sinusitis with mucosal edema in the paranasal sinuses and associated bony thickening of the maxillary sinus bilaterally. Air-fluid level left maxillary sinus. No skull lesion.  IMPRESSION: Negative CT of the head  Chronic sinusitis.   Electronically Signed   By: Franchot Gallo M.D.   On: 08/13/2014 09:10   Mr Thoracic Spine W Wo Contrast  08/13/2014   CLINICAL DATA:  Chronic  progressive back pain. History of epidural abscess. Progressive headache. Bilateral leg pain.  EXAM: MRI THORACIC AND LUMBAR SPINE WITHOUT CONTRAST  TECHNIQUE: Multiplanar and multiecho pulse sequences of the thoracic and lumbar spine were obtained without intravenous contrast.  COMPARISON:  None.  MRIs of the lumbar spine dated 02/24/2014 and 12/25/2013 and 09/21/2013 and CT scan of the chest dated 07/12/2013 and chest x-ray dated 10/17/2007  FINDINGS: MR THORACIC SPINE FINDINGS  There is chronic accentuation of the thoracic kyphosis with an old compression deformity of T7, unchanged since 2009. There is diffuse very low signal intensity from the vertebral bodies throughout the thoracic spine. This is most likely related to the patient's myeloma or possibly a red marrow reactivation secondary to anemia. The thoracic spinal cord is normal. There is no bone destruction. There is no myelopathy or mass lesion. There is consolidation in both lung bases, more extensive on the right than the left. Benign knee hemangiomata are present in T12 and L1. Normal conus tip at L1.  MR LUMBAR SPINE FINDINGS  Normal conus tip at L1.  T12-L1:  Normal disc.  Benign hemangiomata and T12 and L1.  Psoas muscles bilaterally.  L2-3: There is pus in the disc space and there is a small amount of pus that extends into the ventral epidural space with slight compression of the thecal sac. There is is new since the prior MRI. This inflammatory process extends into the right neural foramen more extensively than on the prior study and extends extra foraminal. There is gas in the disc space and in the superior aspect of the L3 vertebra as previously demonstrated.  There is diffuse abnormal enhancement of the psoas muscles after contrast administration. No discrete psoas abscesses however.  L3-4: The inflammatory changes at the L3-4 level have improved since the prior exam. There is slight enhancement in the disc space and of the  endplates to a  lesser degree. There is an unchanged right foraminal and extra foraminal disc protrusion.  L4-5:  The disc is normal.  No visible epidural enhancement.  L5-S1: Stable grade 1 spondylolisthesis with a diffuse broad-based bulge of the uncovered disc. Postsurgical changes on the left. Enhancing scar tissue to the expected degree after contrast administration. No evidence of epidural abscess. Diminished enhancement of the left pedicle and facets at L5.  There is increased enhancement in the sacral spinal canal below the terminale shin of the thecal sac at S2. No discrete epidural abscess at that site.  IMPRESSION: MR THORACIC SPINE IMPRESSION  No acute abnormality of the thoracic spine.  Old compression deformity of T7.  Diffuse low signal from the bone marrow of the thoracic spine which could be due to red marrow reactivation or be associated with the patient's myeloma. No pathologic enhancement of the thoracic spine after contrast administration. Specifically, no evidence of epidural abscess, discitis, or osteomyelitis.  Bilateral consolidative infiltrates in both lower lobes consistent with pneumonia.  MR LUMBAR SPINE IMPRESSION  Progressive osteomyelitis and discitis at L2-3 with a new small ventral epidural abscess at L2-3 extending into the right neural foramen.  New enhancement of the psoas muscles bilaterally suggestive of myositis. No discrete  Improved discitis and osteomyelitis at L3-4.  Critical Value/emergent results were called by telephone at the time of interpretation on 08/13/2014 at 4:41 pm to Dr. Colin Rhein, who verbally acknowledged these results.   Electronically Signed   By: Lorriane Shire M.D.   On: 08/13/2014 16:11   Mr Lumbar Spine W Wo Contrast  08/13/2014   CLINICAL DATA:  Chronic progressive back pain. History of epidural abscess. Progressive headache. Bilateral leg pain.  EXAM: MRI THORACIC AND LUMBAR SPINE WITHOUT CONTRAST  TECHNIQUE: Multiplanar and multiecho pulse sequences of the thoracic  and lumbar spine were obtained without intravenous contrast.  COMPARISON:  None.  MRIs of the lumbar spine dated 02/24/2014 and 12/25/2013 and 09/21/2013 and CT scan of the chest dated 07/12/2013 and chest x-ray dated 10/17/2007  FINDINGS: MR THORACIC SPINE FINDINGS  There is chronic accentuation of the thoracic kyphosis with an old compression deformity of T7, unchanged since 2009. There is diffuse very low signal intensity from the vertebral bodies throughout the thoracic spine. This is most likely related to the patient's myeloma or possibly a red marrow reactivation secondary to anemia. The thoracic spinal cord is normal. There is no bone destruction. There is no myelopathy or mass lesion. There is consolidation in both lung bases, more extensive on the right than the left. Benign knee hemangiomata are present in T12 and L1. Normal conus tip at L1.  MR LUMBAR SPINE FINDINGS  Normal conus tip at L1.  T12-L1:  Normal disc.  Benign hemangiomata and T12 and L1.  Psoas muscles bilaterally.  L2-3: There is pus in the disc space and there is a small amount of pus that extends into the ventral epidural space with slight compression of the thecal sac. There is is new since the prior MRI. This inflammatory process extends into the right neural foramen more extensively than on the prior study and extends extra foraminal. There is gas in the disc space and in the superior aspect of the L3 vertebra as previously demonstrated.  There is diffuse abnormal enhancement of the psoas muscles after contrast administration. No discrete psoas abscesses however.  L3-4: The inflammatory changes at the L3-4 level have improved since the prior exam. There is slight enhancement in  the disc space and of the endplates to a lesser degree. There is an unchanged right foraminal and extra foraminal disc protrusion.  L4-5:  The disc is normal.  No visible epidural enhancement.  L5-S1: Stable grade 1 spondylolisthesis with a diffuse broad-based  bulge of the uncovered disc. Postsurgical changes on the left. Enhancing scar tissue to the expected degree after contrast administration. No evidence of epidural abscess. Diminished enhancement of the left pedicle and facets at L5.  There is increased enhancement in the sacral spinal canal below the terminale shin of the thecal sac at S2. No discrete epidural abscess at that site.  IMPRESSION: MR THORACIC SPINE IMPRESSION  No acute abnormality of the thoracic spine.  Old compression deformity of T7.  Diffuse low signal from the bone marrow of the thoracic spine which could be due to red marrow reactivation or be associated with the patient's myeloma. No pathologic enhancement of the thoracic spine after contrast administration. Specifically, no evidence of epidural abscess, discitis, or osteomyelitis.  Bilateral consolidative infiltrates in both lower lobes consistent with pneumonia.  MR LUMBAR SPINE IMPRESSION  Progressive osteomyelitis and discitis at L2-3 with a new small ventral epidural abscess at L2-3 extending into the right neural foramen.  New enhancement of the psoas muscles bilaterally suggestive of myositis. No discrete  Improved discitis and osteomyelitis at L3-4.  Critical Value/emergent results were called by telephone at the time of interpretation on 08/13/2014 at 4:41 pm to Dr. Colin Rhein, who verbally acknowledged these results.   Electronically Signed   By: Lorriane Shire M.D.   On: 08/13/2014 16:11   Ir Fluoro Guide Ndl Plmt / Bx  08/14/2014   CLINICAL DATA:  L2-3 discitis  EXAM: IR FLUORO GUIDE NEEDLE PLACEMENT /BIOPSY  FLUOROSCOPY TIME:  2 minutes and 24 seconds  MEDICATIONS AND MEDICAL HISTORY: Versed 1 mg, Fentanyl none mcg.  Additional Medications: None.  ANESTHESIA/SEDATION: Moderate sedation time: 15 minutes  CONTRAST:  None  PROCEDURE: The procedure, risks, benefits, and alternatives were explained to the patient. Questions regarding the procedure were encouraged and answered. The patient  understands and consents to the procedure.  The back was prepped with Betadine in a sterile fashion, and a sterile drape was applied covering the operative field. A sterile gown and sterile gloves were used for the procedure.  Under fluoroscopic guidance, an 18 gauge needle was inserted into the L2-3 disc via a right posterior lateral approach. Aspiration yielded a cc of pus.  FINDINGS: Image documents needle placement in the D6-3 disc  COMPLICATIONS: None  IMPRESSION: Successful L2-3 disc aspiration.   Electronically Signed   By: Marybelle Killings M.D.   On: 08/14/2014 13:24    CBC  Recent Labs Lab 08/13/14 0804 08/14/14 0314 08/15/14 0333  WBC 10.3 10.5 7.4  HGB 12.1 9.8* 9.8*  HCT 36.7 31.0* 31.4*  PLT 339 264 288  MCV 84.2 87.3 87.2  MCH 27.8 27.6 27.2  MCHC 33.0 31.6 31.2  RDW 13.7 14.1 13.5  LYMPHSABS 1.0  --   --   MONOABS 0.2  --   --   EOSABS 0.0  --   --   BASOSABS 0.0  --   --     Chemistries   Recent Labs Lab 08/13/14 0804 08/14/14 0314 08/15/14 0333  NA 136 140 139  K 4.3 4.0 3.9  CL 99* 106 105  CO2 '25 28 27  '$ GLUCOSE 126* 103* 77  BUN '14 8 13  '$ CREATININE 0.70 0.51 0.50  CALCIUM 10.1  9.7 9.7   ------------------------------------------------------------------------------------------------------------------ estimated creatinine clearance is 64.3 mL/min (by C-G formula based on Cr of 0.5). ------------------------------------------------------------------------------------------------------------------ No results for input(s): HGBA1C in the last 72 hours. ------------------------------------------------------------------------------------------------------------------ No results for input(s): CHOL, HDL, LDLCALC, TRIG, CHOLHDL, LDLDIRECT in the last 72 hours. ------------------------------------------------------------------------------------------------------------------ No results for input(s): TSH, T4TOTAL, T3FREE, THYROIDAB in the last 72 hours.  Invalid  input(s): FREET3 ------------------------------------------------------------------------------------------------------------------ No results for input(s): VITAMINB12, FOLATE, FERRITIN, TIBC, IRON, RETICCTPCT in the last 72 hours.  Coagulation profile  Recent Labs Lab 08/14/14 0314  INR 1.15    No results for input(s): DDIMER in the last 72 hours.  Cardiac Enzymes No results for input(s): CKMB, TROPONINI, MYOGLOBIN in the last 168 hours.  Invalid input(s): CK ------------------------------------------------------------------------------------------------------------------ Invalid input(s): POCBNP    Naftuli Dalsanto D.O. on 08/16/2014 at 11:46 AM  Between 7am to 7pm - Pager - [phone]  After 7pm go to [email or web address] - password [email or web address]  And look for the night coverage person covering for me after hours  [facility] Office  [phone]

## 2014-08-16 NOTE — Progress Notes (Signed)
ANTIBIOTIC CONSULT NOTE - FOLLOW UP  Pharmacy Consult for Vancomycin Indication: osteomyelitis of lumbar spine  Allergies  Allergen Reactions  . Morphine And Related Other (See Comments)    Headache  . Tetracyclines & Related Nausea And Vomiting    Patient Measurements: Height: '5\' 1"'$  (154.9 cm) Weight: 154 lb 3.2 oz (69.945 kg) IBW/kg (Calculated) : 47.8 Adjusted Body Weight:   Vital Signs: Temp: 98.5 F (36.9 C) (06/19 1414) Temp Source: Oral (06/19 1414) BP: 127/72 mmHg (06/19 1414) Pulse Rate: 67 (06/19 1414) Intake/Output from previous day: 06/18 0701 - 06/19 0700 In: 720 [P.O.:720] Out: 250 [Urine:250] Intake/Output from this shift: Total I/O In: 600 [P.O.:600] Out: -   Labs:  Recent Labs  08/14/14 0314 08/15/14 0333  WBC 10.5 7.4  HGB 9.8* 9.8*  PLT 264 288  CREATININE 0.51 0.50   Estimated Creatinine Clearance: 64.3 mL/min (by C-G formula based on Cr of 0.5).  Recent Labs  08/16/14 1500  [place] [facility]     Microbiology: Recent Results (from the past 720 hour(s))  MRSA PCR Screening     Status: Abnormal   Collection Time: 08/13/14  8:58 PM  Result Value Ref Range Status   MRSA by PCR POSITIVE (A) NEGATIVE Final    Comment:        The GeneXpert MRSA Assay (FDA approved for NASAL specimens only), is one component of a comprehensive MRSA colonization surveillance program. It is not intended to diagnose MRSA infection nor to guide or monitor treatment for MRSA infections. RESULT CALLED TO, READ BACK BY AND VERIFIED WITH: Derrel Nip [id number] SKEENP/WILDERK   Fungus Culture with Smear     Status: None (Preliminary result)   Collection Time: 08/14/14 12:54 PM  Result Value Ref Range Status   Specimen Description FLUID  Final   Special Requests INTERVERTEBRAL DISC  Final   Fungal Smear   Final    NO YEAST OR FUNGAL ELEMENTS SEEN Performed at [facility]    Culture   Final    CULTURE IN PROGRESS FOR FOUR WEEKS Performed at  [facility]    Report Status PENDING  Incomplete  AFB culture with smear     Status: None (Preliminary result)   Collection Time: 08/14/14 12:54 PM  Result Value Ref Range Status   Specimen Description FLUID  Final   Special Requests INTERVERTEBRAL DISC  Final   Acid Fast Smear   Final    NO ACID FAST BACILLI SEEN Performed at [facility]    Culture   Final    CULTURE WILL BE EXAMINED FOR 6 WEEKS BEFORE ISSUING A FINAL REPORT Performed at [facility]    Report Status PENDING  Incomplete  Culture, routine-abscess     Status: None (Preliminary result)   Collection Time: 08/14/14 12:54 PM  Result Value Ref Range Status   Specimen Description ABSCESS  Final   Special Requests DISK ASPIRATION L2/3  Final   Gram Stain   Final    ABUNDANT WBC PRESENT, PREDOMINANTLY PMN NO SQUAMOUS EPITHELIAL CELLS SEEN NO ORGANISMS SEEN Performed at [facility]    Culture   Final    Culture reincubated for better growth Performed at [facility]    Report Status PENDING  Incomplete    Anti-infectives    Start     Dose/Rate Route Frequency Ordered Stop   08/14/14 1600  vancomycin (VANCOCIN) 750 mg in sodium chloride 0.9 % 150 mL IVPB     750  mg 150 mL/hr over 60 Minutes Intravenous Every 12 hours 08/14/14 1537     08/13/14 1800  vancomycin (VANCOCIN) 1,500 mg in sodium chloride 0.9 % 500 mL IVPB     1,500 mg 250 mL/hr over 120 Minutes Intravenous  Once 08/13/14 1721 08/13/14 2035   08/13/14 1730  piperacillin-tazobactam (ZOSYN) IVPB 3.375 g     3.375 g 100 mL/hr over 30 Minutes Intravenous  Once 08/13/14 1721 08/13/14 1835      Assessment: 64 y.o. 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 Pharmacy to continue Vancomycin for treatment of MRSA osteomyelitis.  VT is therapeutic at 15 on vancomycin '750mg'$  IV  q12h, though on the low end of therapeutic. Will increase slightly to reach trough level closer to upper limit of 20 mcg/ml. Pt is afebrile, WBC wnl, sCr wnl.  Goal of Therapy:  Vancomycin trough level 15-20 mcg/ml  Plan:  Vancomycin 1g IV q12h Measure antibiotic drug levels at steady state Follow up culture results, renal function, and clinical course  Andrey Cota. Diona Foley, PharmD Clinical Pharmacist Pager [phone] 08/16/2014,4:33 PM

## 2014-08-17 DIAGNOSIS — A4902 Methicillin resistant Staphylococcus aureus infection, unspecified site: Secondary | ICD-10-CM

## 2014-08-17 DIAGNOSIS — M549 Dorsalgia, unspecified: Secondary | ICD-10-CM

## 2014-08-17 DIAGNOSIS — G8929 Other chronic pain: Secondary | ICD-10-CM

## 2014-08-17 LAB — CBC
HCT: 34 % — ABNORMAL LOW (ref 36.0–46.0)
Hemoglobin: 11.1 g/dL — ABNORMAL LOW (ref 12.0–15.0)
MCH: 27.6 pg (ref 26.0–34.0)
MCHC: 32.6 g/dL (ref 30.0–36.0)
MCV: 84.6 fL (ref 78.0–100.0)
Platelets: 307 10*3/uL (ref 150–400)
RBC: 4.02 MIL/uL (ref 3.87–5.11)
RDW: 13.3 % (ref 11.5–15.5)
WBC: 5.9 10*3/uL (ref 4.0–10.5)

## 2014-08-17 LAB — BASIC METABOLIC PANEL
Anion gap: 6 (ref 5–15)
BUN: 8 mg/dL (ref 6–20)
CO2: 32 mmol/L (ref 22–32)
Calcium: 9.9 mg/dL (ref 8.9–10.3)
Chloride: 100 mmol/L — ABNORMAL LOW (ref 101–111)
Creatinine, Ser: 0.61 mg/dL (ref 0.44–1.00)
GFR calc Af Amer: >60 mL/min (ref >60)
GFR calc non Af Amer: >60 mL/min (ref >60)
Glucose, Bld: 101 mg/dL — ABNORMAL HIGH (ref 65–99)
Potassium: 3.1 mmol/L — ABNORMAL LOW (ref 3.5–5.1)
Sodium: 138 mmol/L (ref 135–145)

## 2014-08-17 LAB — PREALBUMIN: Prealbumin: 9.8 mg/dL — ABNORMAL LOW (ref 18–38)

## 2014-08-17 LAB — C-REACTIVE PROTEIN: CRP: 4.5 mg/dL — ABNORMAL HIGH (ref <1.0)

## 2014-08-17 LAB — SEDIMENTATION RATE: Sed Rate: 75 mm/hr — ABNORMAL HIGH (ref 0–22)

## 2014-08-17 LAB — HIV ANTIBODY (ROUTINE TESTING W REFLEX): HIV Screen 4th Generation wRfx: NONREACTIVE

## 2014-08-17 MED ORDER — POTASSIUM CHLORIDE CRYS ER 20 MEQ PO TBCR
60.0000 meq | EXTENDED_RELEASE_TABLET | Freq: Once | ORAL | Status: AC
  Administered 2014-08-17: 60 meq via ORAL
  Filled 2014-08-17: qty 3

## 2014-08-17 MED ORDER — SODIUM CHLORIDE 0.9 % IJ SOLN
10.0000 mL | INTRAMUSCULAR | Status: DC | PRN
  Administered 2014-08-18 (×2): 10 mL
  Filled 2014-08-17 (×2): qty 40

## 2014-08-17 NOTE — Progress Notes (Signed)
[facility]  Date of Admission:  08/13/2014  Antibiotics: vancomycin  Subjective: Her pain is much improved, less nausea  Objective: Temp:  [98.5 F (36.9 C)-99.5 F (37.5 C)] 99.3 F (37.4 C) (06/20 0534) Pulse Rate:  [65-70] 65 (06/20 0534) Resp:  [14-16] 16 (06/20 0534) BP: (123-140)/(64-72) 123/64 mmHg (06/20 0534) SpO2:  [94 %-97 %] 94 % (06/20 0534) Weight:  [163 lb 11.2 oz (74.254 kg)] 163 lb 11.2 oz (74.254 kg) (06/20 0534)  General: awake, alert, nad Skin: no rashes Lungs: CTA B Cor: RRR Abdomen: soft, nt, nd   Lab Results Lab Results  Component Value Date   WBC 5.9 08/17/2014   HGB 11.1* 08/17/2014   HCT 34.0* 08/17/2014   MCV 84.6 08/17/2014   PLT 307 08/17/2014    Lab Results  Component Value Date   CREATININE 0.61 08/17/2014   BUN 8 08/17/2014   NA 138 08/17/2014   K 3.1* 08/17/2014   CL 100* 08/17/2014   CO2 32 08/17/2014    Lab Results  Component Value Date   ALT 10* 08/07/2014   AST 21 08/07/2014   ALKPHOS 85 08/07/2014   BILITOT 0.2* 08/07/2014      Microbiology: Recent Results (from the past 240 hour(s))  MRSA PCR Screening     Status: Abnormal   Collection Time: 08/13/14  8:58 PM  Result Value Ref Range Status   MRSA by PCR POSITIVE (A) NEGATIVE Final    Comment:        The GeneXpert MRSA Assay (FDA approved for NASAL specimens only), is one component of a comprehensive MRSA colonization surveillance program. It is not intended to diagnose MRSA infection nor to guide or monitor treatment for MRSA infections. RESULT CALLED TO, READ BACK BY AND VERIFIED WITH: Derrel Nip [phone] SKEENP/WILDERK   Fungus Culture with Smear     Status: None (Preliminary result)   Collection Time: 08/14/14 12:54 PM  Result Value Ref Range Status   Specimen Description FLUID  Final   Special Requests INTERVERTEBRAL DISC  Final   Fungal Smear   Final    NO YEAST OR FUNGAL ELEMENTS SEEN Performed at [facility]    Culture   Final    CULTURE IN PROGRESS FOR FOUR WEEKS Performed at [facility]    Report Status PENDING  Incomplete  AFB culture with smear     Status: None (Preliminary result)   Collection Time: 08/14/14 12:54 PM  Result Value Ref Range Status   Specimen Description FLUID  Final   Special Requests INTERVERTEBRAL DISC  Final   Acid Fast Smear   Final    NO ACID FAST BACILLI SEEN Performed at [facility]    Culture   Final    CULTURE WILL BE EXAMINED FOR 6 WEEKS BEFORE ISSUING A FINAL REPORT Performed at [facility]    Report Status PENDING  Incomplete  Culture, routine-abscess     Status: None (Preliminary result)   Collection Time: 08/14/14 12:54 PM  Result Value Ref Range Status   Specimen Description ABSCESS  Final   Special Requests DISK ASPIRATION L2/3  Final   Gram Stain   Final    ABUNDANT WBC PRESENT, PREDOMINANTLY PMN NO SQUAMOUS EPITHELIAL CELLS SEEN NO ORGANISMS SEEN Performed at [facility]    Culture   Final    Culture reincubated for better growth Performed at [facility]    Report Status PENDING  Incomplete  Studies/Results: No results found.  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  Did unfortunately receive IV vancomycin and zosyn in ED prior to aspiration.  -picc line ordered -continue vancomycin with trough goal of 15-20 for 8 weeks through August 4th -antibiotics per home health protocol -weekly cbc, cmp, vancomycin trough to [facility] -we will arrange follow up in [facility] in about 3-4 weeks. -will need prolonged oral antibiotics following IV course of 6-12 months. May consider Bactrim then based on previous sensitivities.   Thanks for consult  Scharlene Gloss, [facility] [place] [email or web address] [phone] pager   [phone] cell 08/17/2014, 10:01 AM

## 2014-08-17 NOTE — Care Management Note (Signed)
Case Management Note  Patient Details  Name: [name] MRN: [id number] Date of Birth: [birth year]  Subjective/Objective:                    Action/Plan: Confirmed face sheet information with patient . She has done IV  ABX at home Aug 2015 , She would like to use [facility] again . Will need HHRN order and prescription for IV ABX . Magdalen Spatz RN BSN [phone]   Expected Discharge Date:                  Expected Discharge Plan:  [place]  In-House Referral:     Discharge planning Services     Post Acute Care Choice:    Choice offered to:     DME Arranged:    DME Agency:     HH Arranged:    [place] Agency:     Status of Service:     Medicare Important Message Given:    Date Medicare IM Given:    Medicare IM give by:    Date Additional Medicare IM Given:    Additional Medicare Important Message give by:     If discussed at [place] of Stay Meetings, dates discussed:    Additional Comments:  Marilu Favre, RN 08/17/2014, 11:04 AM

## 2014-08-17 NOTE — Care Management Note (Addendum)
Case Management Note  Patient Details  Name: Autumn Duran MRN: [id number] Date of Birth: 08/21/1950  Subjective/Objective:                    Action/Plan: SW aware SNF recommendation . SW spoke with patient regarding SNF , now patient would like to go home with home health Pam with [facility] aware.   Expected Discharge Date:     08-18-14              Expected Discharge Plan:  Home health  In-House Referral:  Clinical Social Work  Discharge planning Services     Post Acute Care Choice:    Choice offered to:     DME Arranged:    DME Agency:     HH Arranged:    [place] Agency:     Status of Service:  In process, will continue to follow  Medicare Important Message Given:    Date Medicare IM Given:    Medicare IM give by:    Date Additional Medicare IM Given:    Additional Medicare Important Message give by:     If discussed at [place] of Stay Meetings, dates discussed:    Additional Comments:  Marilu Favre, RN 08/17/2014, 2:11 PM

## 2014-08-17 NOTE — Progress Notes (Signed)
Pt was referred by consult. Pt was concerned about her health and the diagnosis. Pt was teary as the Chaplain entered  the room. Chaplain asked Pt  about her family and Pt talked about her son's\  family and grandson. Pt seem to responded with hope about going home to see her grandson. Chaplain prayed with Pt. Pt begin to smile after the prayer and said the prayer was helpful. Chaplain spoke with the doctor and assured him Chaplain will follow up regularly.    08/17/14 0900  Clinical Encounter Type  Visited With Patient  Visit Type Spiritual support  Referral From Physician  Spiritual Encounters  Spiritual Needs Prayer;Emotional  Stress Factors  Patient Stress Factors Major life changes

## 2014-08-17 NOTE — Evaluation (Signed)
Physical Therapy Evaluation Patient Details Name: Autumn Duran MRN: [id number] DOB: Feb 24, 1951 Today's Date: 08/17/2014   History of Present Illness  pt presents with Epidural Abscess with recent back surgery and treatment for abscess.    Clinical Impression  Pt concerned about D/C to home at this time as her family is away for the week and she is unsure if she can get enough assistance at this time.  Also pt had previously had a bed on the first floor and now only has a bed upstairs and is concerned about managing this at home.  Discussed D/C options with pt and at this time feel she would benefit from ST-SNF at D/C to maximize independence prior to returning to home.  Will continue to follow.      Follow Up Recommendations SNF    Equipment Recommendations  None recommended by PT    Recommendations for Other Services       Precautions / Restrictions Precautions Precautions: Fall;Back Precaution Booklet Issued: No Precaution Comments: pt re-educated on back precautions for comfort Restrictions Weight Bearing Restrictions: No      Mobility  Bed Mobility Overal bed mobility: Needs Assistance Bed Mobility: Sidelying to Sit;Sit to Sidelying   Sidelying to sit: Min guard     Sit to sidelying: Min guard General bed mobility comments: pt demonstrates good technique without cueing.    Transfers Overall transfer level: Needs assistance Equipment used: Rolling walker (2 wheeled) Transfers: Sit to/from Stand Sit to Stand: Min guard         General transfer comment: cues for UE use.    Ambulation/Gait Ambulation/Gait assistance: Min guard Ambulation Distance (Feet): 15 Feet (x2) Assistive device: Rolling walker (2 wheeled) Gait Pattern/deviations: Step-through pattern;Decreased stride length     General Gait Details: cues for staying closer to RW and upright posture.  pt fatigues easily.    Stairs            Wheelchair Mobility    Modified Rankin (Stroke  Patients Only)       Balance Overall balance assessment: Needs assistance Sitting-balance support: No upper extremity supported;Feet supported Sitting balance-Leahy Scale: Fair     Standing balance support: No upper extremity supported;During functional activity Standing balance-Leahy Scale: Fair                               Pertinent Vitals/Pain Pain Assessment: 0-10 Pain Score: 5  Pain Location: Back Pain Descriptors / Indicators: Aching Pain Intervention(s): Monitored during session;Premedicated before session;Repositioned    Home Living Family/patient expects to be discharged to:: Unsure Living Arrangements: Alone Available Help at Discharge: Family;Available PRN/intermittently Type of Home: House Home Access: Level entry     Home Layout: Two level;Bed/bath upstairs Home Equipment: Walker - 2 wheels;Bedside commode;Cane - single point Additional Comments: pt reports some of her family is out of town this week and that she no longer has a bed on the first floor.      Prior Function Level of Independence: Independent with assistive device(s)         Comments: pt uses cane, sister does grocery shopping     Hand Dominance   Dominant Hand: Right    Extremity/Trunk Assessment   Upper Extremity Assessment: Generalized weakness           Lower Extremity Assessment: Generalized weakness      Cervical / Trunk Assessment: Normal  Communication   Communication: No difficulties  Cognition Arousal/Alertness:  Awake/alert Behavior During Therapy: WFL for tasks assessed/performed Overall Cognitive Status: No family/caregiver present to determine baseline cognitive functioning                      General Comments      Exercises        Assessment/Plan    PT Assessment Patient needs continued PT services  PT Diagnosis Difficulty walking;Acute pain   PT Problem List Decreased strength;Decreased activity tolerance;Decreased  balance;Decreased mobility;Decreased knowledge of use of DME;Pain  PT Treatment Interventions DME instruction;Gait training;Stair training;Functional mobility training;Therapeutic exercise;Therapeutic activities;Balance training;Neuromuscular re-education;Patient/family education   PT Goals (Current goals can be found in the Care Plan section) Acute Rehab PT Goals Patient Stated Goal: to get better and go home PT Goal Formulation: With patient Time For Goal Achievement: 08/31/14 Potential to Achieve Goals: Good    Frequency Min 3X/week   Barriers to discharge Decreased caregiver support pt indicates her family is away for the week and is unsure if she will have A other than her sister this week.      Co-evaluation               End of Session Equipment Utilized During Treatment: Gait belt Activity Tolerance: Patient limited by fatigue;Patient limited by pain Patient left: in bed;with call bell/phone within reach Nurse Communication: Mobility status         Time: 8563-1497 PT Time Calculation (min) (ACUTE ONLY): 20 min   Charges:   PT Evaluation $Initial PT Evaluation Tier I: 1 Procedure     PT G CodesCatarina Duran, [place] 08/17/2014, 12:10 PM

## 2014-08-17 NOTE — Progress Notes (Signed)
[facility]                                                                              Patient Demographics  Autumn Duran, is a 64 y.o. female, DOB - 1950-12-30, XFG:[id number]  Admit date - 08/13/2014   Admitting Physician Phillips Grout, MD  Outpatient Primary MD for the patient is [name],[name], MD  LOS - 4   Chief Complaint  Patient presents with  . Back Pain      HPI on 08/13/2014 by Dr. [name] 65 yo female h/o htn, ckd, MRSA epidural absess and osteomyelitis of L spine treated with iv vanco than transitioned to oral doxycycline for about 3 months per patient report. Has been following with ID as outpatient. Was taken off doxycycline several months ago, but her back pain returned more severe she put herself back on the doxy for a couple of weeks (over a month ago). Pt has had severe lower back pain over the last week,not controlled with her home pain meds. Denies any fevers. No n/t/or weakness in her legs but unable to walk due to pain. Denies any saddle anesthesia or urinary/bowel incontinence. Repeat mri today shows epidural absess and progression of her osteo c/w previous mri.  Assessment & Plan   Back pain/Epidural Abscess/osteomyelitis/Discitis -MRI: Aggressive osteomyelitis and discitis L2-3, new small ventral epidural abscess L2-3. New enhancement of the sella as muscle suggestive of mild ascites. Improved discitis and osteomyelitis at L3-4. -Infectious disease consulted and appreciated -Neurosurgery consulted and appreciated -Interventional radiology consulted and appreciated -S/p Fluoro-guided L2/3 disk aspiration - 1cc pus -Patient had MRSA epidural abscess 1 year ago s/p lumbar surgery -She was treated with vancomycin and followed by course of doxycycline -Patient received 1 dose of vanc/zosyn in the ER -Continue vancomycin- Will need IV vanc for 8 weeks- through August 4th.  Trough goal 15-20 -After completion of IV antibiotics, patient will need  prolonged course of PO antibiotics from 6-12 months -Continue pain control -Culture: No AFB seen, no growth, no yeast or fungal elements -Will order PICC -Pending PT eval -Will ask the Chaplain to speak with patient, as she is fearful  Hypertension -Currently stable, on no home medications  GERD -Continue Protonix  Chronic anemia -Appears to be microcytic and normocytic -Baseline hemoglobin approximately 10-11, currently 11.1 -Will continue to monitor CBC  Hypokalemia -Potassium 3.1 -Will replace and continue to monitor BMP  Constipation -Continue senokot -Will add on enema  Code Status: Full  Family Communication: None at bedside  Disposition Plan: Admitted, Continue vancomycin. Pending PT eval.  Will obtain PICC line today.  Possible d/c 08/18/2014  Time Spent in minutes   30 minutes  Procedures  Disc aspiration  Consults   Interventional radiology Infectious disease Neurosurgery  DVT Prophylaxis  SCDs  Lab Results  Component Value Date   PLT 307 08/17/2014    Medications  Scheduled Meds: . Chlorhexidine Gluconate Cloth  6 each Topical Q0600  . mupirocin ointment  1 application Nasal BID  . OxyCODONE  20 mg Oral Q12H  . pantoprazole  40 mg Oral Daily  . senna-docusate  1 tablet Oral BID  . tobramycin-dexamethasone  1 drop Left Eye  Q4H while awake  . vancomycin  1,000 mg Intravenous Q12H   Continuous Infusions:  PRN Meds:.diazepam, HYDROmorphone (DILAUDID) injection, ondansetron **OR** ondansetron (ZOFRAN) IV  Antibiotics    Anti-infectives    Start     Dose/Rate Route Frequency Ordered Stop   08/16/14 1700  vancomycin (VANCOCIN) IVPB 1000 mg/200 mL premix     1,000 mg 200 mL/hr over 60 Minutes Intravenous Every 12 hours 08/16/14 1638     08/14/14 1600  vancomycin (VANCOCIN) 750 mg in sodium chloride 0.9 % 150 mL IVPB  Status:  Discontinued     750 mg 150 mL/hr over 60 Minutes Intravenous Every 12 hours 08/14/14 1537 08/16/14 1638   08/13/14  1800  vancomycin (VANCOCIN) 1,500 mg in sodium chloride 0.9 % 500 mL IVPB     1,500 mg 250 mL/hr over 120 Minutes Intravenous  Once 08/13/14 1721 08/13/14 2035   08/13/14 1730  piperacillin-tazobactam (ZOSYN) IVPB 3.375 g     3.375 g 100 mL/hr over 30 Minutes Intravenous  Once 08/13/14 1721 08/13/14 1835        Subjective:   [name] seen and examined today.  Patient feels slightly better, but is very fearful.  She also complains of constipation.  Denies chest pain, shortness of breath, headache, dizziness.   Objective:   Filed Vitals:   08/16/14 0423 08/16/14 1414 08/16/14 2125 08/17/14 0534  BP: 125/68 127/72 140/66 123/64  Pulse: 67 67 70 65  Temp: 98.7 F (37.1 C) 98.5 F (36.9 C) 99.5 F (37.5 C) 99.3 F (37.4 C)  TempSrc: Oral Oral Oral Oral  Resp: '16 16 14 16  '$ Height:      Weight:    74.254 kg (163 lb 11.2 oz)  SpO2: 100% 97% 95% 94%    Wt Readings from Last 3 Encounters:  08/17/14 74.254 kg (163 lb 11.2 oz)  08/13/14 68.947 kg (152 lb)  08/07/14 69.037 kg (152 lb 3.2 oz)     Intake/Output Summary (Last 24 hours) at 08/17/14 1040 Last data filed at 08/17/14 0843  Gross per 24 hour  Intake    600 ml  Output      0 ml  Net    600 ml    Exam  General: Well developed, well nourished, NAD  HEENT: NCAT, mucous membranes moist.   Cardiovascular: S1 S2 auscultated, RRR, no murmurs  Respiratory: Clear to auscultation   Abdomen: Soft, nontender, nondistended, + bowel sounds  Extremities: warm dry without cyanosis clubbing or edema  Neuro: AAOx3, nonfocal  Psych: Anxious, tearful  Data Review   Micro Results Recent Results (from the past 240 hour(s))  MRSA PCR Screening     Status: Abnormal   Collection Time: 08/13/14  8:58 PM  Result Value Ref Range Status   MRSA by PCR POSITIVE (A) NEGATIVE Final    Comment:        The GeneXpert MRSA Assay (FDA approved for NASAL specimens only), is one component of a comprehensive MRSA  colonization surveillance program. It is not intended to diagnose MRSA infection nor to guide or monitor treatment for MRSA infections. RESULT CALLED TO, READ BACK BY AND VERIFIED WITHDerrel Nip [id number] SKEENP/WILDERK   Fungus Culture with Smear     Status: None (Preliminary result)   Collection Time: 08/14/14 12:54 PM  Result Value Ref Range Status   Specimen Description FLUID  Final   Special Requests INTERVERTEBRAL DISC  Final   Fungal Smear   Final    NO YEAST  OR FUNGAL ELEMENTS SEEN Performed at [facility]    Culture   Final    CULTURE IN PROGRESS FOR FOUR WEEKS Performed at [facility]    Report Status PENDING  Incomplete  AFB culture with smear     Status: None (Preliminary result)   Collection Time: 08/14/14 12:54 PM  Result Value Ref Range Status   Specimen Description FLUID  Final   Special Requests INTERVERTEBRAL DISC  Final   Acid Fast Smear   Final    NO ACID FAST BACILLI SEEN Performed at [facility]    Culture   Final    CULTURE WILL BE EXAMINED FOR 6 WEEKS BEFORE ISSUING A FINAL REPORT Performed at [facility]    Report Status PENDING  Incomplete  Culture, routine-abscess     Status: None (Preliminary result)   Collection Time: 08/14/14 12:54 PM  Result Value Ref Range Status   Specimen Description ABSCESS  Final   Special Requests DISK ASPIRATION L2/3  Final   Gram Stain   Final    ABUNDANT WBC PRESENT, PREDOMINANTLY PMN NO SQUAMOUS EPITHELIAL CELLS SEEN NO ORGANISMS SEEN Performed at [facility]   Final    Culture reincubated for better growth Performed at [facility]    Report Status PENDING  Incomplete    Radiology Reports Ct Head Wo Contrast  08/13/2014   CLINICAL DATA:  Headache with nausea vomiting 2 days  EXAM: CT HEAD WITHOUT CONTRAST  TECHNIQUE: Contiguous axial images were obtained from the base of the skull through the vertex without intravenous contrast.   COMPARISON:  None.  FINDINGS: Ventricle size normal. Negative for acute or chronic infarction. Negative for hemorrhage or mass. No edema or shift of the midline structures  Chronic sinusitis with mucosal edema in the paranasal sinuses and associated bony thickening of the maxillary sinus bilaterally. Air-fluid level left maxillary sinus. No skull lesion.  IMPRESSION: Negative CT of the head  Chronic sinusitis.   Electronically Signed   By: Franchot Gallo M.D.   On: 08/13/2014 09:10   Mr Thoracic Spine W Wo Contrast  08/13/2014   CLINICAL DATA:  Chronic progressive back pain. History of epidural abscess. Progressive headache. Bilateral leg pain.  EXAM: MRI THORACIC AND LUMBAR SPINE WITHOUT CONTRAST  TECHNIQUE: Multiplanar and multiecho pulse sequences of the thoracic and lumbar spine were obtained without intravenous contrast.  COMPARISON:  None.  MRIs of the lumbar spine dated 02/24/2014 and 12/25/2013 and 09/21/2013 and CT scan of the chest dated 07/12/2013 and chest x-ray dated 10/17/2007  FINDINGS: MR THORACIC SPINE FINDINGS  There is chronic accentuation of the thoracic kyphosis with an old compression deformity of T7, unchanged since 2009. There is diffuse very low signal intensity from the vertebral bodies throughout the thoracic spine. This is most likely related to the patient's myeloma or possibly a red marrow reactivation secondary to anemia. The thoracic spinal cord is normal. There is no bone destruction. There is no myelopathy or mass lesion. There is consolidation in both lung bases, more extensive on the right than the left. Benign knee hemangiomata are present in T12 and L1. Normal conus tip at L1.  MR LUMBAR SPINE FINDINGS  Normal conus tip at L1.  T12-L1:  Normal disc.  Benign hemangiomata and T12 and L1.  Psoas muscles bilaterally.  L2-3: There is pus in the disc space and there is a small amount of pus that extends into the ventral epidural space  with slight compression of the thecal sac.  There is is new since the prior MRI. This inflammatory process extends into the right neural foramen more extensively than on the prior study and extends extra foraminal. There is gas in the disc space and in the superior aspect of the L3 vertebra as previously demonstrated.  There is diffuse abnormal enhancement of the psoas muscles after contrast administration. No discrete psoas abscesses however.  L3-4: The inflammatory changes at the L3-4 level have improved since the prior exam. There is slight enhancement in the disc space and of the endplates to a lesser degree. There is an unchanged right foraminal and extra foraminal disc protrusion.  L4-5:  The disc is normal.  No visible epidural enhancement.  L5-S1: Stable grade 1 spondylolisthesis with a diffuse broad-based bulge of the uncovered disc. Postsurgical changes on the left. Enhancing scar tissue to the expected degree after contrast administration. No evidence of epidural abscess. Diminished enhancement of the left pedicle and facets at L5.  There is increased enhancement in the sacral spinal canal below the terminale shin of the thecal sac at S2. No discrete epidural abscess at that site.  IMPRESSION: MR THORACIC SPINE IMPRESSION  No acute abnormality of the thoracic spine.  Old compression deformity of T7.  Diffuse low signal from the bone marrow of the thoracic spine which could be due to red marrow reactivation or be associated with the patient's myeloma. No pathologic enhancement of the thoracic spine after contrast administration. Specifically, no evidence of epidural abscess, discitis, or osteomyelitis.  Bilateral consolidative infiltrates in both lower lobes consistent with pneumonia.  MR LUMBAR SPINE IMPRESSION  Progressive osteomyelitis and discitis at L2-3 with a new small ventral epidural abscess at L2-3 extending into the right neural foramen.  New enhancement of the psoas muscles bilaterally suggestive of myositis. No discrete  Improved  discitis and osteomyelitis at L3-4.  Critical Value/emergent results were called by telephone at the time of interpretation on 08/13/2014 at 4:41 pm to Dr. Colin Rhein, who verbally acknowledged these results.   Electronically Signed   By: Lorriane Shire M.D.   On: 08/13/2014 16:11   Mr Lumbar Spine W Wo Contrast  08/13/2014   CLINICAL DATA:  Chronic progressive back pain. History of epidural abscess. Progressive headache. Bilateral leg pain.  EXAM: MRI THORACIC AND LUMBAR SPINE WITHOUT CONTRAST  TECHNIQUE: Multiplanar and multiecho pulse sequences of the thoracic and lumbar spine were obtained without intravenous contrast.  COMPARISON:  None.  MRIs of the lumbar spine dated 02/24/2014 and 12/25/2013 and 09/21/2013 and CT scan of the chest dated 07/12/2013 and chest x-ray dated 10/17/2007  FINDINGS: MR THORACIC SPINE FINDINGS  There is chronic accentuation of the thoracic kyphosis with an old compression deformity of T7, unchanged since 2009. There is diffuse very low signal intensity from the vertebral bodies throughout the thoracic spine. This is most likely related to the patient's myeloma or possibly a red marrow reactivation secondary to anemia. The thoracic spinal cord is normal. There is no bone destruction. There is no myelopathy or mass lesion. There is consolidation in both lung bases, more extensive on the right than the left. Benign knee hemangiomata are present in T12 and L1. Normal conus tip at L1.  MR LUMBAR SPINE FINDINGS  Normal conus tip at L1.  T12-L1:  Normal disc.  Benign hemangiomata and T12 and L1.  Psoas muscles bilaterally.  L2-3: There is pus in the disc space and there is a small amount of pus that  extends into the ventral epidural space with slight compression of the thecal sac. There is is new since the prior MRI. This inflammatory process extends into the right neural foramen more extensively than on the prior study and extends extra foraminal. There is gas in the disc space and in the  superior aspect of the L3 vertebra as previously demonstrated.  There is diffuse abnormal enhancement of the psoas muscles after contrast administration. No discrete psoas abscesses however.  L3-4: The inflammatory changes at the L3-4 level have improved since the prior exam. There is slight enhancement in the disc space and of the endplates to a lesser degree. There is an unchanged right foraminal and extra foraminal disc protrusion.  L4-5:  The disc is normal.  No visible epidural enhancement.  L5-S1: Stable grade 1 spondylolisthesis with a diffuse broad-based bulge of the uncovered disc. Postsurgical changes on the left. Enhancing scar tissue to the expected degree after contrast administration. No evidence of epidural abscess. Diminished enhancement of the left pedicle and facets at L5.  There is increased enhancement in the sacral spinal canal below the terminale shin of the thecal sac at S2. No discrete epidural abscess at that site.  IMPRESSION: MR THORACIC SPINE IMPRESSION  No acute abnormality of the thoracic spine.  Old compression deformity of T7.  Diffuse low signal from the bone marrow of the thoracic spine which could be due to red marrow reactivation or be associated with the patient's myeloma. No pathologic enhancement of the thoracic spine after contrast administration. Specifically, no evidence of epidural abscess, discitis, or osteomyelitis.  Bilateral consolidative infiltrates in both lower lobes consistent with pneumonia.  MR LUMBAR SPINE IMPRESSION  Progressive osteomyelitis and discitis at L2-3 with a new small ventral epidural abscess at L2-3 extending into the right neural foramen.  New enhancement of the psoas muscles bilaterally suggestive of myositis. No discrete  Improved discitis and osteomyelitis at L3-4.  Critical Value/emergent results were called by telephone at the time of interpretation on 08/13/2014 at 4:41 pm to Dr. Colin Rhein, who verbally acknowledged these results.    Electronically Signed   By: Lorriane Shire M.D.   On: 08/13/2014 16:11   Ir Fluoro Guide Ndl Plmt / Bx  08/14/2014   CLINICAL DATA:  L2-3 discitis  EXAM: IR FLUORO GUIDE NEEDLE PLACEMENT /BIOPSY  FLUOROSCOPY TIME:  2 minutes and 24 seconds  MEDICATIONS AND MEDICAL HISTORY: Versed 1 mg, Fentanyl none mcg.  Additional Medications: None.  ANESTHESIA/SEDATION: Moderate sedation time: 15 minutes  CONTRAST:  None  PROCEDURE: The procedure, risks, benefits, and alternatives were explained to the patient. Questions regarding the procedure were encouraged and answered. The patient understands and consents to the procedure.  The back was prepped with Betadine in a sterile fashion, and a sterile drape was applied covering the operative field. A sterile gown and sterile gloves were used for the procedure.  Under fluoroscopic guidance, an 18 gauge needle was inserted into the L2-3 disc via a right posterior lateral approach. Aspiration yielded a cc of pus.  FINDINGS: Image documents needle placement in the I9-6 disc  COMPLICATIONS: None  IMPRESSION: Successful L2-3 disc aspiration.   Electronically Signed   By: Marybelle Killings M.D.   On: 08/14/2014 13:24    CBC  Recent Labs Lab 08/13/14 0804 08/14/14 0314 08/15/14 0333 08/17/14 0545  WBC 10.3 10.5 7.4 5.9  HGB 12.1 9.8* 9.8* 11.1*  HCT 36.7 31.0* 31.4* 34.0*  PLT 339 264 288 307  MCV 84.2 87.3 87.2  84.6  MCH 27.8 27.6 27.2 27.6  MCHC 33.0 31.6 31.2 32.6  RDW 13.7 14.1 13.5 13.3  LYMPHSABS 1.0  --   --   --   MONOABS 0.2  --   --   --   EOSABS 0.0  --   --   --   BASOSABS 0.0  --   --   --     Chemistries   Recent Labs Lab 08/13/14 0804 08/14/14 0314 08/15/14 0333 08/17/14 0545  NA 136 140 139 138  K 4.3 4.0 3.9 3.1*  CL 99* 106 105 100*  CO2 '25 28 27 '$ 32  GLUCOSE 126* 103* 77 101*  BUN '14 8 13 8  '$ CREATININE 0.70 0.51 0.50 0.61  CALCIUM 10.1 9.7 9.7 9.9    ------------------------------------------------------------------------------------------------------------------ estimated creatinine clearance is 66.4 mL/min (by C-G formula based on Cr of 0.61). ------------------------------------------------------------------------------------------------------------------ No results for input(s): HGBA1C in the last 72 hours. ------------------------------------------------------------------------------------------------------------------ No results for input(s): CHOL, HDL, LDLCALC, TRIG, CHOLHDL, LDLDIRECT in the last 72 hours. ------------------------------------------------------------------------------------------------------------------ No results for input(s): TSH, T4TOTAL, T3FREE, THYROIDAB in the last 72 hours.  Invalid input(s): FREET3 ------------------------------------------------------------------------------------------------------------------ No results for input(s): VITAMINB12, FOLATE, FERRITIN, TIBC, IRON, RETICCTPCT in the last 72 hours.  Coagulation profile  Recent Labs Lab 08/14/14 0314  INR 1.15    No results for input(s): DDIMER in the last 72 hours.  Cardiac Enzymes No results for input(s): CKMB, TROPONINI, MYOGLOBIN in the last 168 hours.  Invalid input(s): CK ------------------------------------------------------------------------------------------------------------------ Invalid input(s): POCBNP    Whitaker Holderman D.O. on 08/17/2014 at 10:40 AM  Between 7am to 7pm - Pager - [phone]  After 7pm go to [email or web address] - password [email or web address]  And look for the night coverage person covering for me after hours  [facility] Office  [phone]

## 2014-08-17 NOTE — Clinical Social Work Note (Signed)
CSW received referral for SNF.  Case discussed with patient and case manager, plan is to discharge home with home health.  Case manager made aware, CSW to sign off please re-consult if social work needs arise.  Jones Broom. [place], MSW, [place]

## 2014-08-17 NOTE — Progress Notes (Signed)
Peripherally Inserted Central Catheter/Midline Placement  The IV Nurse has discussed with the patient and/or persons authorized to consent for the patient, the purpose of this procedure and the potential benefits and risks involved with this procedure.  The benefits include less needle sticks, lab draws from the catheter and patient may be discharged home with the catheter.  Risks include, but not limited to, infection, bleeding, blood clot (thrombus formation), and puncture of an artery; nerve damage and irregular heat beat.  Alternatives to this procedure were also discussed.  PICC/Midline Placement Documentation        Khilee Hendricksen, [name] 08/17/2014, 1:55 PM

## 2014-08-18 LAB — BASIC METABOLIC PANEL
Anion gap: 7 (ref 5–15)
BUN: 8 mg/dL (ref 6–20)
CO2: 30 mmol/L (ref 22–32)
Calcium: 10 mg/dL (ref 8.9–10.3)
Chloride: 100 mmol/L — ABNORMAL LOW (ref 101–111)
Creatinine, Ser: 0.6 mg/dL (ref 0.44–1.00)
GFR calc Af Amer: >60 mL/min (ref >60)
GFR calc non Af Amer: >60 mL/min (ref >60)
Glucose, Bld: 87 mg/dL (ref 65–99)
Potassium: 4 mmol/L (ref 3.5–5.1)
Sodium: 137 mmol/L (ref 135–145)

## 2014-08-18 LAB — CBC
HCT: 32.9 % — ABNORMAL LOW (ref 36.0–46.0)
Hemoglobin: 10.8 g/dL — ABNORMAL LOW (ref 12.0–15.0)
MCH: 27.5 pg (ref 26.0–34.0)
MCHC: 32.8 g/dL (ref 30.0–36.0)
MCV: 83.7 fL (ref 78.0–100.0)
Platelets: 309 10*3/uL (ref 150–400)
RBC: 3.93 MIL/uL (ref 3.87–5.11)
RDW: 13.4 % (ref 11.5–15.5)
WBC: 5 10*3/uL (ref 4.0–10.5)

## 2014-08-18 LAB — HEPATITIS C ANTIBODY (REFLEX): HCV Ab: <0.1 s/co ratio (ref 0.0–0.9)

## 2014-08-18 LAB — CULTURE, ROUTINE-ABSCESS

## 2014-08-18 LAB — HCV COMMENT:

## 2014-08-18 MED ORDER — HEPARIN SOD (PORK) LOCK FLUSH 100 UNIT/ML IV SOLN
250.0000 [IU] | INTRAVENOUS | Status: AC | PRN
  Administered 2014-08-18: 250 [IU]

## 2014-08-18 MED ORDER — BUPIVACAINE-EPINEPHRINE (PF) 0.25% -1:200000 IJ SOLN
INTRAMUSCULAR | Status: AC
  Filled 2014-08-18: qty 30

## 2014-08-18 MED ORDER — VANCOMYCIN HCL IN DEXTROSE 1-5 GM/200ML-% IV SOLN: 1000.0000 mg | Freq: Two times a day (BID) | INTRAVENOUS | Status: DC

## 2014-08-18 MED ORDER — FUROSEMIDE 40 MG PO TABS: 20.0000 mg | ORAL_TABLET | Freq: Every day | ORAL | Status: DC | PRN

## 2014-08-18 MED FILL — Succinylcholine Chloride Inj 20 MG/ML: INTRAMUSCULAR | Qty: 10 | Status: AC

## 2014-08-18 MED FILL — Hydromorphone HCl Inj 1 MG/ML: INTRAMUSCULAR | Qty: 1 | Status: AC

## 2014-08-18 MED FILL — Ondansetron HCl Inj 4 MG/2ML (2 MG/ML): INTRAMUSCULAR | Qty: 2 | Status: AC

## 2014-08-18 MED FILL — Fentanyl Citrate Preservative Free (PF) Inj 100 MCG/2ML: INTRAMUSCULAR | Qty: 2 | Status: AC

## 2014-08-18 MED FILL — Glycopyrrolate Inj 0.4 MG/2ML (0.2 MG/ML): INTRAMUSCULAR | Qty: 2 | Status: AC

## 2014-08-18 MED FILL — Propofol IV Emul 200 MG/20ML (10 MG/ML): INTRAVENOUS | Qty: 20 | Status: AC

## 2014-08-18 MED FILL — Lidocaine HCl IV Inj 20 MG/ML: INTRAVENOUS | Qty: 5 | Status: AC

## 2014-08-18 MED FILL — Lactated Ringer's Solution: INTRAVENOUS | Qty: 1000 | Status: AC

## 2014-08-18 MED FILL — Rocuronium Bromide IV Soln 50 MG/5ML (10 MG/ML): INTRAVENOUS | Qty: 5 | Status: AC

## 2014-08-18 MED FILL — Ephedrine Sulfate Inj 50 MG/ML: INTRAMUSCULAR | Qty: 1 | Status: AC

## 2014-08-18 MED FILL — Neostigmine Methylsulfate IV Soln 10 MG/10 ML (1 MG/ML): INTRAVENOUS | Qty: 3 | Status: AC

## 2014-08-18 NOTE — Progress Notes (Signed)
CRITICAL VALUE ALERT  Critical value received:  Abscess culture growing MRSA  Date of notification:  08/18/14  Time of notification:  0805  Critical value read back:Yes.    Nurse who received alert:  Kathrine Cords, RN  MD notified (1st page):  Dr. Ree Kida  Time of first page:  [phone]  Responding MD:  Dr. Ree Kida  Time MD responded:  [phone]

## 2014-08-18 NOTE — Progress Notes (Signed)
Althea Charon to be D/C'd Home with Home Health per MD order.  Discussed with the patient and all questions fully answered.  VSS, Incision site to lower back is clean, dry, intact.   PICC line flushed and capped by IV team for home abx therapy.   An After Visit Summary was printed and given to the patient. Follow up appointment made with patient's primary care doctor and message left with Infectious Disease clinic so that they can call patient to arrange follow up.  D/c education completed with patient/family including follow up instructions, medication list, d/c activities limitations if indicated, with other d/c instructions as indicated by MD - patient able to verbalize understanding, all questions fully answered.   Patient instructed to return to ED, call 911, or call MD for any changes in condition.   Patient escorted via [place], and D/C home via private auto.  Micki Riley 08/18/2014 10:27 AM

## 2014-08-18 NOTE — Care Management Note (Addendum)
Case Management Note  Patient Details  Name: KRITIKA STUKES MRN: [id number] Date of Birth: 04-22-1950  Subjective/Objective:                    Action/Plan:   Expected Discharge Date:     08-18-14             Expected Discharge Plan:  [place]  In-House Referral:     Discharge planning Services  CM Consult  Post Acute Care Choice:  Home Health Choice offered to:  Patient  DME Arranged:    DME Agency:     HH Arranged:  RN, PT, OT, Nurse's Aide [place] Agency:  [place]  Status of Service:  Completed, signed off  Medicare Important Message Given:    Date Medicare IM Given:    Medicare IM give by:    Date Additional Medicare IM Given:    Additional Medicare Important Message give by:     If discussed at [place] of Stay Meetings, dates discussed:  08-18-14  Additional Comments:  Marilu Favre, RN 08/18/2014, 10:20 AM

## 2014-08-18 NOTE — Discharge Summary (Signed)
Physician Discharge Summary  Autumn Duran:[id number] DOB: 03-23-1950 DOA: 08/13/2014  PCP: [name], MD  Admit date: 08/13/2014 Discharge date: 08/18/2014  Time spent: 45 minutes  Recommendations for Outpatient Follow-up:  Patient will be discharged to home with home health services.  Patient will need to follow up with primary care provider within one week of discharge.  She will also need to follow up with Dr. Talbot Grumbling or Michel Bickers, Infectious Disease, in 3-4 weeks.  Patient will need to have vancomycin trough, CBC, CMP weekly and sent to Dr. Linus Salmons. Patient should continue medications as prescribed.  Patient should follow a heart healthy diet.   Discharge Diagnoses:  Back pain/Epidural Abscess/osteomyelitis/Discitis Hypertension GERD Chronic anemia Hypokalemia Constipation  Discharge Condition: Stable  Diet recommendation: Heart healthy  Filed Weights   08/14/14 0500 08/15/14 0500 08/17/14 0534  Weight: 68.04 kg (150 lb) 69.945 kg (154 lb 3.2 oz) 74.254 kg (163 lb 11.2 oz)    History of present illness:  on 08/13/2014 by Dr. 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 Has been following with ID as outpatient. Was taken off doxycycline several months ago, but her back pain returned more severe she put herself back on the doxy for a couple of weeks (over a month ago). Pt has had severe lower back pain over the last week,not controlled with her home pain meds. Denies any fevers. No n/t/or weakness in her legs but unable to walk due to pain. Denies any saddle anesthesia or urinary/bowel incontinence. Repeat mri today shows epidural absess and progression of her osteo c/w previous mri.  Hospital Course:  Back pain/Epidural Abscess/osteomyelitis/Discitis -MRI: Aggressive osteomyelitis and discitis L2-3, new small ventral  epidural abscess L2-3. New enhancement of the sella as muscle suggestive of mild ascites. Improved discitis and osteomyelitis at L3-4. -Infectious disease consulted and appreciated -Neurosurgery consulted and appreciated -Interventional radiology consulted and appreciated -S/p Fluoro-guided L2/3 disk aspiration - 1cc pus -Patient had MRSA epidural abscess 1 year ago s/p lumbar surgery -She was treated with vancomycin and followed by course of doxycycline -Patient received 1 dose of vanc/zosyn in the ER -Continue vancomycin- Will need IV vanc for 8 weeks- through August 4th. Trough goal 15-20 -After completion of IV antibiotics, patient will need prolonged course of PO antibiotics from 6-12 months.  Patient will need weekly CBC, CMP, vanc trough levels. -Continue pain control -Culture: No AFB seen, no yeast or fungal elements; +MRSA -PICC line placed -PT recommended SNF, however patient has decided she wants to go home with home health.  Hypertension -Currently stable, on no home medications  GERD -Continue Protonix  Chronic anemia -Appears to be microcytic and normocytic -Baseline hemoglobin approximately 10-11, currently 10.8  Hypokalemia -Resolved, Potassium 4  Constipation -Resolved, Continue senokot  Procedures  Disc aspiration  Consults  Interventional radiology Infectious disease Neurosurgery  Discharge Exam: Filed Vitals:   08/18/14 0524  BP: 117/65  Pulse: 59  Temp: 98.6 F (37 C)  Resp: 16   Exam  General: Well developed, well nourished, NAD  HEENT: NCAT, mucous membranes moist.   Cardiovascular: S1 S2 auscultated, RRR, no murmurs  Respiratory: Clear to auscultation  Abdomen: Soft, nontender, nondistended, + bowel sounds  Extremities: warm dry without cyanosis clubbing or edema  Neuro: AAOx3, nonfocal  Psych: Appropriate mood and affect  Discharge Instructions      Discharge  Instructions    Discharge instructions    Complete by:  As  directed   Patient will be discharged to home with home health services.  Patient will need to follow up with primary care provider within one week of discharge.  She will also need to follow up with Dr. Talbot Grumbling or Michel Bickers, Infectious Disease, in 3-4 weeks.  Patient will need to have vancomycin trough, CBC, CMP weekly and sent to Dr. Linus Salmons. Patient should continue medications as prescribed.  Patient should follow a heart healthy diet.   You were cared for by a hospitalist during your hospital stay. If you have any questions about your discharge medications or the care you received while you were in the hospital after you are discharged, you can call the unit and asked to speak with the hospitalist on call if the hospitalist that took care of you is not available. Once you are discharged, your primary care physician will handle any further medical issues. Please note that NO REFILLS for any discharge medications will be authorized once you are discharged, as it is imperative that you return to your primary care physician (or establish a relationship with a primary care physician if you do not have one) for your aftercare needs so that they can reassess your need for medications and monitor your lab values.            Medication List    TAKE these medications        diazepam 5 MG tablet  Commonly known as:  VALIUM  Take 1 tablet (5 mg total) by mouth every 8 (eight) hours as needed for anxiety.     furosemide 40 MG tablet  Commonly known as:  LASIX  Take 0.5 tablets (20 mg total) by mouth daily as needed for fluid.     HYDROcodone-acetaminophen 10-325 MG per tablet  Commonly known as:  NORCO  Take 1 or 2 tablets every 4 hours to control pain     ibuprofen 800 MG tablet  Commonly known as:  ADVIL,MOTRIN  Take 1 tablet (800 mg total) by mouth every 8 (eight) hours as needed for mild pain.     magnesium hydroxide 400 MG/5ML suspension  Commonly known as:  MILK OF MAGNESIA  Take 30  mLs by mouth daily as needed for mild constipation.     omeprazole 40 MG capsule  Commonly known as:  PRILOSEC  Take 40 mg by mouth daily as needed (acid reflux).     ondansetron 4 MG tablet  Commonly known as:  ZOFRAN  Take 1 tablet (4 mg total) by mouth every 8 (eight) hours as needed for nausea or vomiting.     OxyCODONE 20 mg T12a 12 hr tablet  Commonly known as:  OXYCONTIN  Take 1 tablet (20 mg total) by mouth every 12 (twelve) hours.     potassium chloride SA 20 MEQ tablet  Commonly known as:  K-DUR,KLOR-CON  Take 20 mEq by mouth daily as needed (supplement).     RABEprazole 20 MG tablet  Commonly known as:  ACIPHEX  Take 1 tablet (20 mg total) by mouth daily.     senna 8.6 MG tablet  Commonly known as:  SENOKOT  Take 1 tablet by mouth as needed.     tobramycin-dexamethasone ophthalmic solution  Commonly known as:  TOBRADEX  Place 1 drop into the left eye every 4 (four) hours while awake.     vancomycin 1 GM/200ML Soln  Commonly known as:  VANCOCIN  Inject 200 mLs (1,000 mg total) into the vein every 12 (twelve) hours. Use through October 01, 2014       Allergies  Allergen Reactions  . Morphine And Related Other (See Comments)    Headache  . Tetracyclines & Related Nausea And Vomiting   Follow-up Information    Follow up with [name],[name], MD. Schedule an appointment as soon as possible for a visit in 1 week.   Specialty:  Internal Medicine   Why:  Hospital follow up   Contact information:   [place] [address] [phone]       Follow up with Scharlene Gloss, MD. Schedule an appointment as soon as possible for a visit in 3 weeks.   Specialty:  Infectious Diseases   Why:  Hospital follow up   Contact information:   [address] [place] [address] [phone]       Schedule an appointment as soon as possible for a visit with Charlie Pitter, MD.   Specialty:  Neurosurgery   Why:  As needed   Contact information:   [place]  [address] [place] [address] [phone]        The results of significant diagnostics from this hospitalization (including imaging, microbiology, ancillary and laboratory) are listed below for reference.    Significant Diagnostic Studies: Ct Head Wo Contrast  08/13/2014   CLINICAL DATA:  Headache with nausea vomiting 2 days  EXAM: CT HEAD WITHOUT CONTRAST  TECHNIQUE: Contiguous axial images were obtained from the base of the skull through the vertex without intravenous contrast.  COMPARISON:  None.  FINDINGS: Ventricle size normal. Negative for acute or chronic infarction. Negative for hemorrhage or mass. No edema or shift of the midline structures  Chronic sinusitis with mucosal edema in the paranasal sinuses and associated bony thickening of the maxillary sinus bilaterally. Air-fluid level left maxillary sinus. No skull lesion.  IMPRESSION: Negative CT of the head  Chronic sinusitis.   Electronically Signed   By: Franchot Gallo M.D.   On: 08/13/2014 09:10   Mr Thoracic Spine W Wo Contrast  08/13/2014   CLINICAL DATA:  Chronic progressive back pain. History of epidural abscess. Progressive headache. Bilateral leg pain.  EXAM: MRI THORACIC AND LUMBAR SPINE WITHOUT CONTRAST  TECHNIQUE: Multiplanar and multiecho pulse sequences of the thoracic and lumbar spine were obtained without intravenous contrast.  COMPARISON:  None.  MRIs of the lumbar spine dated 02/24/2014 and 12/25/2013 and 09/21/2013 and CT scan of the chest dated 07/12/2013 and chest x-ray dated 10/17/2007  FINDINGS: MR THORACIC SPINE FINDINGS  There is chronic accentuation of the thoracic kyphosis with an old compression deformity of T7, unchanged since 2009. There is diffuse very low signal intensity from the vertebral bodies throughout the thoracic spine. This is most likely related to the patient's myeloma or possibly a red marrow reactivation secondary to anemia. The thoracic spinal cord is normal. There is no bone  destruction. There is no myelopathy or mass lesion. There is consolidation in both lung bases, more extensive on the right than the left. Benign knee hemangiomata are present in T12 and L1. Normal conus tip at L1.  MR LUMBAR SPINE FINDINGS  Normal conus tip at L1.  T12-L1:  Normal disc.  Benign hemangiomata and T12 and L1.  Psoas muscles bilaterally.  L2-3: There is pus in the disc space and there is a small amount of pus that extends into the ventral epidural space  with slight compression of the thecal sac. There is is new since the prior MRI. This inflammatory process extends into the right neural foramen more extensively than on the prior study and extends extra foraminal. There is gas in the disc space and in the superior aspect of the L3 vertebra as previously demonstrated.  There is diffuse abnormal enhancement of the psoas muscles after contrast administration. No discrete psoas abscesses however.  L3-4: The inflammatory changes at the L3-4 level have improved since the prior exam. There is slight enhancement in the disc space and of the endplates to a lesser degree. There is an unchanged right foraminal and extra foraminal disc protrusion.  L4-5:  The disc is normal.  No visible epidural enhancement.  L5-S1: Stable grade 1 spondylolisthesis with a diffuse broad-based bulge of the uncovered disc. Postsurgical changes on the left. Enhancing scar tissue to the expected degree after contrast administration. No evidence of epidural abscess. Diminished enhancement of the left pedicle and facets at L5.  There is increased enhancement in the sacral spinal canal below the terminale shin of the thecal sac at S2. No discrete epidural abscess at that site.  IMPRESSION: MR THORACIC SPINE IMPRESSION  No acute abnormality of the thoracic spine.  Old compression deformity of T7.  Diffuse low signal from the bone marrow of the thoracic spine which could be due to red marrow reactivation or be associated with the patient's  myeloma. No pathologic enhancement of the thoracic spine after contrast administration. Specifically, no evidence of epidural abscess, discitis, or osteomyelitis.  Bilateral consolidative infiltrates in both lower lobes consistent with pneumonia.  MR LUMBAR SPINE IMPRESSION  Progressive osteomyelitis and discitis at L2-3 with a new small ventral epidural abscess at L2-3 extending into the right neural foramen.  New enhancement of the psoas muscles bilaterally suggestive of myositis. No discrete  Improved discitis and osteomyelitis at L3-4.  Critical Value/emergent results were called by telephone at the time of interpretation on 08/13/2014 at 4:41 pm to Dr. Colin Rhein, who verbally acknowledged these results.   Electronically Signed   By: Lorriane Shire M.D.   On: 08/13/2014 16:11   Mr Lumbar Spine W Wo Contrast  08/13/2014   CLINICAL DATA:  Chronic progressive back pain. History of epidural abscess. Progressive headache. Bilateral leg pain.  EXAM: MRI THORACIC AND LUMBAR SPINE WITHOUT CONTRAST  TECHNIQUE: Multiplanar and multiecho pulse sequences of the thoracic and lumbar spine were obtained without intravenous contrast.  COMPARISON:  None.  MRIs of the lumbar spine dated 02/24/2014 and 12/25/2013 and 09/21/2013 and CT scan of the chest dated 07/12/2013 and chest x-ray dated 10/17/2007  FINDINGS: MR THORACIC SPINE FINDINGS  There is chronic accentuation of the thoracic kyphosis with an old compression deformity of T7, unchanged since 2009. There is diffuse very low signal intensity from the vertebral bodies throughout the thoracic spine. This is most likely related to the patient's myeloma or possibly a red marrow reactivation secondary to anemia. The thoracic spinal cord is normal. There is no bone destruction. There is no myelopathy or mass lesion. There is consolidation in both lung bases, more extensive on the right than the left. Benign knee hemangiomata are present in T12 and L1. Normal conus tip at L1.  MR  LUMBAR SPINE FINDINGS  Normal conus tip at L1.  T12-L1:  Normal disc.  Benign hemangiomata and T12 and L1.  Psoas muscles bilaterally.  L2-3: There is pus in the disc space and there is a small amount of pus that  extends into the ventral epidural space with slight compression of the thecal sac. There is is new since the prior MRI. This inflammatory process extends into the right neural foramen more extensively than on the prior study and extends extra foraminal. There is gas in the disc space and in the superior aspect of the L3 vertebra as previously demonstrated.  There is diffuse abnormal enhancement of the psoas muscles after contrast administration. No discrete psoas abscesses however.  L3-4: The inflammatory changes at the L3-4 level have improved since the prior exam. There is slight enhancement in the disc space and of the endplates to a lesser degree. There is an unchanged right foraminal and extra foraminal disc protrusion.  L4-5:  The disc is normal.  No visible epidural enhancement.  L5-S1: Stable grade 1 spondylolisthesis with a diffuse broad-based bulge of the uncovered disc. Postsurgical changes on the left. Enhancing scar tissue to the expected degree after contrast administration. No evidence of epidural abscess. Diminished enhancement of the left pedicle and facets at L5.  There is increased enhancement in the sacral spinal canal below the terminale shin of the thecal sac at S2. No discrete epidural abscess at that site.  IMPRESSION: MR THORACIC SPINE IMPRESSION  No acute abnormality of the thoracic spine.  Old compression deformity of T7.  Diffuse low signal from the bone marrow of the thoracic spine which could be due to red marrow reactivation or be associated with the patient's myeloma. No pathologic enhancement of the thoracic spine after contrast administration. Specifically, no evidence of epidural abscess, discitis, or osteomyelitis.  Bilateral consolidative infiltrates in both lower lobes  consistent with pneumonia.  MR LUMBAR SPINE IMPRESSION  Progressive osteomyelitis and discitis at L2-3 with a new small ventral epidural abscess at L2-3 extending into the right neural foramen.  New enhancement of the psoas muscles bilaterally suggestive of myositis. No discrete  Improved discitis and osteomyelitis at L3-4.  Critical Value/emergent results were called by telephone at the time of interpretation on 08/13/2014 at 4:41 pm to Dr. Colin Rhein, who verbally acknowledged these results.   Electronically Signed   By: Lorriane Shire M.D.   On: 08/13/2014 16:11   Ir Fluoro Guide Ndl Plmt / Bx  08/14/2014   CLINICAL DATA:  L2-3 discitis  EXAM: IR FLUORO GUIDE NEEDLE PLACEMENT /BIOPSY  FLUOROSCOPY TIME:  2 minutes and 24 seconds  MEDICATIONS AND MEDICAL HISTORY: Versed 1 mg, Fentanyl none mcg.  Additional Medications: None.  ANESTHESIA/SEDATION: Moderate sedation time: 15 minutes  CONTRAST:  None  PROCEDURE: The procedure, risks, benefits, and alternatives were explained to the patient. Questions regarding the procedure were encouraged and answered. The patient understands and consents to the procedure.  The back was prepped with Betadine in a sterile fashion, and a sterile drape was applied covering the operative field. A sterile gown and sterile gloves were used for the procedure.  Under fluoroscopic guidance, an 18 gauge needle was inserted into the L2-3 disc via a right posterior lateral approach. Aspiration yielded a cc of pus.  FINDINGS: Image documents needle placement in the E3-1 disc  COMPLICATIONS: None  IMPRESSION: Successful L2-3 disc aspiration.   Electronically Signed   By: Marybelle Killings M.D.   On: 08/14/2014 13:24    Microbiology: Recent Results (from the past 240 hour(s))  MRSA PCR Screening     Status: Abnormal   Collection Time: 08/13/14  8:58 PM  Result Value Ref Range Status   MRSA by PCR POSITIVE (A) NEGATIVE Final  Comment:        The GeneXpert MRSA Assay (FDA approved for NASAL  specimens only), is one component of a comprehensive MRSA colonization surveillance program. It is not intended to diagnose MRSA infection nor to guide or monitor treatment for MRSA infections. RESULT CALLED TO, READ BACK BY AND VERIFIED WITH: Derrel Nip [id number] SKEENP/WILDERK   Fungus Culture with Smear     Status: None (Preliminary result)   Collection Time: 08/14/14 12:54 PM  Result Value Ref Range Status   Specimen Description FLUID  Final   Special Requests INTERVERTEBRAL DISC  Final   Fungal Smear   Final    NO YEAST OR FUNGAL ELEMENTS SEEN Performed at [facility]    Culture   Final    CULTURE IN PROGRESS FOR FOUR WEEKS Performed at [facility]    Report Status PENDING  Incomplete  AFB culture with smear     Status: None (Preliminary result)   Collection Time: 08/14/14 12:54 PM  Result Value Ref Range Status   Specimen Description FLUID  Final   Special Requests INTERVERTEBRAL DISC  Final   Acid Fast Smear   Final    NO ACID FAST BACILLI SEEN Performed at [facility]    Culture   Final    CULTURE WILL BE EXAMINED FOR 6 WEEKS BEFORE ISSUING A FINAL REPORT Performed at [facility]    Report Status PENDING  Incomplete  Culture, routine-abscess     Status: None   Collection Time: 08/14/14 12:54 PM  Result Value Ref Range Status   Specimen Description ABSCESS  Final   Special Requests DISK ASPIRATION L2/3  Final   Gram Stain   Final    ABUNDANT WBC PRESENT, PREDOMINANTLY PMN NO SQUAMOUS EPITHELIAL CELLS SEEN NO ORGANISMS SEEN Performed at [facility]    Culture   Final    MODERATE METHICILLIN RESISTANT STAPHYLOCOCCUS AUREUS Note: RIFAMPIN AND GENTAMICIN SHOULD NOT BE USED AS SINGLE DRUGS FOR TREATMENT OF STAPH INFECTIONS. CRITICAL RESULT CALLED TO, READ BACK BY AND VERIFIED WITH: TAYLOR COOK ON 08/18/14 '@0800'$  BY KENDR Performed at [facility]    Report Status 08/18/2014 FINAL  Final   Organism ID,  Bacteria METHICILLIN RESISTANT STAPHYLOCOCCUS AUREUS  Final      Susceptibility   Methicillin resistant staphylococcus aureus - MIC*    CLINDAMYCIN <=0.25 SENSITIVE Sensitive     ERYTHROMYCIN <=0.25 SENSITIVE Sensitive     GENTAMICIN <=0.5 SENSITIVE Sensitive     LEVOFLOXACIN <=0.12 SENSITIVE Sensitive     OXACILLIN >=4 RESISTANT Resistant     PENICILLIN >=0.5 RESISTANT Resistant     RIFAMPIN <=0.5 SENSITIVE Sensitive     TRIMETH/SULFA <=10 SENSITIVE Sensitive     VANCOMYCIN 1 SENSITIVE Sensitive     TETRACYCLINE 2 SENSITIVE Sensitive     * MODERATE METHICILLIN RESISTANT STAPHYLOCOCCUS AUREUS     Labs: Basic Metabolic Panel:  Recent Labs Lab 08/13/14 0804 08/14/14 0314 08/15/14 0333 08/17/14 0545 08/18/14 0430  NA 136 140 139 138 137  K 4.3 4.0 3.9 3.1* 4.0  CL 99* 106 105 100* 100*  CO2 '25 28 27 '$ 32 30  GLUCOSE 126* 103* 77 101* 87  BUN '14 8 13 8 8  '$ CREATININE 0.70 0.51 0.50 0.61 0.60  CALCIUM 10.1 9.7 9.7 9.9 10.0   Liver Function Tests: No results for input(s): AST, ALT, ALKPHOS, BILITOT, PROT, ALBUMIN in the last 168 hours. No results for input(s): LIPASE, AMYLASE in the last 168  hours. No results for input(s): AMMONIA in the last 168 hours. CBC:  Recent Labs Lab 08/13/14 0804 08/14/14 0314 08/15/14 0333 08/17/14 0545 08/18/14 0430  WBC 10.3 10.5 7.4 5.9 5.0  NEUTROABS 9.1*  --   --   --   --   HGB 12.1 9.8* 9.8* 11.1* 10.8*  HCT 36.7 31.0* 31.4* 34.0* 32.9*  MCV 84.2 87.3 87.2 84.6 83.7  PLT 339 264 288 307 309   Cardiac Enzymes: No results for input(s): CKTOTAL, CKMB, CKMBINDEX, TROPONINI in the last 168 hours. BNP: BNP (last 3 results) No results for input(s): BNP in the last 8760 hours.  ProBNP (last 3 results) No results for input(s): PROBNP in the last 8760 hours.  CBG: No results for input(s): GLUCAP in the last 168 hours.     SignedCristal Ford  [facility] 08/18/2014, 9:59 AM

## 2014-08-18 NOTE — Discharge Instructions (Signed)
Bone and Joint Infections °Joint infections are called septic or infectious arthritis. An infected joint may damage cartilage and tissue very quickly. This may destroy the joint. Bone infections (osteomyelitis) may last for years. Joints may become stiff if left untreated. Bacteria are the most common cause. Other causes include viruses and fungi, but these are more rare. Bone and joint infections usually come from injury or infection elsewhere in your body; the germs are carried to your bones or joints through the bloodstream.  °CAUSES  °· Blood-carried germs from an infection elsewhere in your body can eventually spread to a bone or joint. The germ staphylococcus is the most common cause of both osteomyelitis and septic arthritis. °· An injury can introduce germs into your bones or joints. °SYMPTOMS  °· Weight loss. °· Tiredness. °· Chills and fever. °· Bone or joint pain at rest and with activity. °· Tenderness when touching the area or bending the joint. °· Refusal to bear weight on a leg or inability to use an arm due to pain. °· Decreased range of motion in a joint. °· Skin redness, warmth, and tenderness. °· Open skin sores and drainage. °RISK FACTORS °Children, the elderly, and those with weak immune systems are at increased risk of bone and joint infections. It is more common in people with HIV infections and with people on chemotherapy. People are also at increased risk if they have surgery where metal implants are used to stabilize the bone. Plates, screws, or artificial joints provide a surface that bacteria can stick on. Such a growth of bacteria is called biofilm. The biofilm protects bacteria from antibiotics and bodily defenses. This allows germs to multiply. Other reasons for increased risks include:  °· Having previous surgery or injury of a bone or joint. °· Being on high-dose corticosteroids and immunosuppressive medications that weaken your body's resistance to germs. °· Diabetes and  long-standing diseases. °· Use of intravenous street drugs. °· Being on hemodialysis. °· Having a history of urinary tract infections. °· Removal of your spleen (splenectomy). This weakens your immunity. °· Chronic viral infections such as HIV or AIDS. °· Lack of sensation such as paraplegia, quadriplegia, or spina bifida. °DIAGNOSIS  °· Increased numbers of white blood cells in your blood may indicate infection. Some times your caregivers are able to identify the infecting germs by testing your blood. Inflammatory markers present in your bloodstream such as an erythrocyte sedimentation rate (ESR or sed rate) or c-reactive protein (CRP) can be indicators of deep infection. °· Bone scans and X-ray exams are necessary for diagnosing osteomyelitis. They may help your caregiver find the infected areas. Other studies may give more detailed information. They may help detect fluid collections around a joint, abnormal bone surfaces, or be useful in diagnosing septic arthritis. They can find soft tissue swelling and find excess fluid in an infected joint or the adjacent bone. These tests include: °¨ Ultrasound. °¨ CT (computerized tomography). °¨ MRI (magnetic resonance imaging). °· The best test for diagnosing a bone or joint infection is an aspiration or biopsy. Your caregiver will usually use a local anesthetic. He or she can then remove tissue from a bone injury or use a needle to take fluids from an infected joint. A local anesthetic medication numbs the area to be biopsied. Often biopsies are done in the operating room under general anesthesia. This means you will be asleep during the procedure. Tests performed on these samples can identify an infection. °TREATMENT  °· Treatment can help control long-standing   infections, but infections may come back.  Infections can infect any bone or joint at any age.  Bone and joint infections are rarely fatal.  Bone infection left untreated can become a never-ending infection.  It can spread to other areas of your body. It may eventually cause bone death. Reduced limb or joint function can result. In severe cases, this may require removal of a limb. Spinal osteomyelitis is very dangerous. Untreated, it may damage spinal nerves and cause death.  The most common complication of septic arthritis is osteoarthritis with pain and decreased range of motion of the joint. Some forms of treatment may include:  If the infection is caused by bacteria, it is generally treated with antibiotics. You will likely receive the drugs through a vein (intravenously) for anywhere from 2 to 6 weeks. In some cases, especially with children, oral antibiotics following an initial intravenous dose may be effective. The treatment you receive depends on the:  Type of bacteria.  Location of the infection.  Type of surgery that might be done.  Other health conditions or issues you might have.  Your caregiver may drain soft tissue abscesses or pockets of fluid around infected bones or joints. If you have septic arthritis, your caregiver may use a needle to drain pus from the joint on a daily basis. He or she may use an arthroscope to clean the joint or may need to open the joint surgically to remove damaged tissue and infection. An arthroscope is an instrument like a thin lighted telescope. It can be used to look inside the joint.  Surgery is usually needed if the infection has become long-standing. It may also be needed if there is hardware (such as metal plates, screws, or artificial joints) inside the patient. Sometimes a bone or muscle graft is needed to fill in the open space. This promotes growth of new tissues and better blood flow to the area. PREVENTION   Clean and disinfect wounds quickly to help prevent the start of a bone or joint infection. Get treatment for any infections to prevent spread to a bone or joint.  Do not smoke. Smoking decreases healing rates of bone and predisposes to  infection.  When given medications that suppress your immune system, use them according to your caregiver's instructions. Do not take more than prescribed for your condition.  Take good care of your feet and skin, especially if you have diabetes, decreased sensation or circulation problems. SEEK IMMEDIATE MEDICAL CARE IF:   You cannot bear weight on a leg or use an arm, especially following a minor injury. This can be a sign of bone or joint infection.  You think you may have signs or symptoms of a bone or joint infection. Your chance of getting rid of an infection is better if treated early. Document Released: 02/13/2005 Document Revised: 05/08/2011 Document Reviewed: 01/13/2009 [facility] Patient Information 2015 [place], [place]. This information is not intended to replace advice given to you by your health care provider. Make sure you discuss any questions you have with your health care provider.

## 2014-09-02 ENCOUNTER — Telehealth (HOSPITAL_COMMUNITY): Payer: Self-pay | Admitting: Physical Therapy

## 2014-09-02 ENCOUNTER — Ambulatory Visit (HOSPITAL_COMMUNITY): Payer: BLUE CROSS/BLUE SHIELD | Attending: Hematology & Oncology | Admitting: Physical Therapy

## 2014-09-02 NOTE — Telephone Encounter (Signed)
Patient is on pic line due to infection and will come to PT after she is cleared from the infection. NF

## 2014-09-10 LAB — FUNGUS CULTURE W SMEAR: Fungal Smear: NONE SEEN

## 2014-09-15 ENCOUNTER — Ambulatory Visit (INDEPENDENT_AMBULATORY_CARE_PROVIDER_SITE_OTHER): Payer: BLUE CROSS/BLUE SHIELD | Admitting: Internal Medicine

## 2014-09-15 ENCOUNTER — Encounter: Payer: Self-pay | Admitting: Internal Medicine

## 2014-09-15 DIAGNOSIS — G061 Intraspinal abscess and granuloma: Secondary | ICD-10-CM | POA: Diagnosis not present

## 2014-09-15 MED ORDER — DOXYCYCLINE HYCLATE 100 MG PO TABS: 100.0000 mg | ORAL_TABLET | Freq: Two times a day (BID) | ORAL | Status: DC

## 2014-09-15 NOTE — Addendum Note (Signed)
Addended by: Michel Bickers on: 09/15/2014 02:22 PM   Modules accepted: Level of Service

## 2014-09-15 NOTE — Progress Notes (Signed)
Patient ID: Autumn Duran, female   DOB: [birth year], 64 y.o.   MRN: [id number]         [facility] for Infectious Disease  Patient Active Problem List   Diagnosis Date Noted  . Right hip pain 12/23/2013    Priority: High  . Epidural abscess, L2-L5 11/04/2013    Priority: High  . MRSA infection   . Osteomyelitis of lumbar spine 08/13/2014  . Acute low back pain 08/13/2014  . Epidural abscess 08/13/2014  . Hypertension   . Chronic back pain   . Essential hypertension   . Other iron deficiency anemias 04/10/2014  . 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 Sepsis 09/21/2013  . Hypercalcemia 08/20/2013  . Hypokalemia 08/20/2013  . Lumbar compression fracture 07/12/2013  . Compression fracture 07/12/2013  . Benign neoplasm of colon 12/18/2011  . Esophageal reflux 12/18/2011  . Other dysphagia 12/18/2011    Patient's Medications  New Prescriptions   DOXYCYCLINE (VIBRA-TABS) 100 MG TABLET    Take 1 tablet (100 mg total) by mouth 2 (two) times daily.  Previous Medications   DIAZEPAM (VALIUM) 5 MG TABLET    Take 1 tablet (5 mg total) by mouth every 8 (eight) hours as needed for anxiety.   FUROSEMIDE (LASIX) 40 MG TABLET    Take 0.5 tablets (20 mg total) by mouth daily as needed for fluid.   HYDROCODONE-ACETAMINOPHEN (NORCO) 10-325 MG PER TABLET    Take 1 or 2 tablets every 4 hours to control pain   IBUPROFEN (ADVIL,MOTRIN) 800 MG TABLET    Take 1 tablet (800 mg total) by mouth every 8 (eight) hours as needed for mild pain.   MAGNESIUM HYDROXIDE (MILK OF MAGNESIA) 400 MG/5ML SUSPENSION    Take 30 mLs by mouth daily as needed for mild constipation.   OMEPRAZOLE (PRILOSEC) 40 MG CAPSULE    Take 40 mg by mouth daily as needed (acid reflux).    ONDANSETRON (ZOFRAN) 4 MG TABLET    Take 1 tablet (4 mg total) by mouth every 8 (eight) hours as needed for nausea or vomiting.   OXYCODONE (OXYCONTIN) 20 MG T12A 12 HR TABLET    Take 1 tablet (20 mg total) by mouth every 12 (twelve) hours.   POTASSIUM CHLORIDE SA (K-DUR,KLOR-CON) 20 MEQ TABLET    Take 20 mEq by mouth daily as needed (supplement).    RABEPRAZOLE (ACIPHEX) 20 MG TABLET    Take 1 tablet (20 mg total) by mouth daily.   SENNA (SENOKOT) 8.6 MG TABLET    Take 1 tablet by mouth as needed.    TOBRAMYCIN-DEXAMETHASONE (TOBRADEX) OPHTHALMIC SOLUTION    Place 1 drop into the left eye every 4 (four) hours while awake.    VANCOMYCIN (VANCOCIN) 1 GM/200ML SOLN    Inject 200 mLs (1,000 mg total) into the vein every 12 (twelve) hours. Use through October 01, 2014  Modified Medications   No medications on file  Discontinued Medications   No medications on file    Subjective: Autumn Duran is in for her hospital follow-up visit. Last month she had recurrent severe back pain and was found to have a relapse of her lumbar vertebral infection. A fluid collection at L2-3 was aspirated and grew MRSA again. She is now completed about 6 weeks of IV vancomycin. She's had no problems tolerating vancomycin or her PICC. Her back and leg pain is improving slowly. Review of Systems: Pertinent items are noted in HPI.  Past Medical History  Diagnosis Date  . Diverticulosis   .  Hemorrhoids   . Hx of adenomatous colonic polyps   . Hiatal hernia   . Hypertension   . Hyperlipidemia   . GERD (gastroesophageal reflux disease)   . Chronic kidney disease     kidney stone s/p stent placement ( removed)  . Complication of anesthesia   . PONV (postoperative nausea and vomiting)   . Bronchitis, chronic   . Chronic back pain   . Lumbar radicular pain   . Compression fracture of L3 lumbar vertebra   . T7 vertebral fracture   . Smoldering myeloma   . Sore throat     History  Substance Use Topics  . Smoking status: Never Smoker   . Smokeless tobacco: Never Used  . Alcohol Use: Yes     Comment: social occ    Family History  Problem Relation Age of Onset  . Breast cancer Sister 19  . Breast cancer Other 64    aunt    Allergies  Allergen  Reactions  . Morphine And Related Other (See Comments)    Headache    Objective: Temp: 98 F (36.7 C) (07/19 1354) Temp Source: Oral (07/19 1354) BP: 149/72 mmHg (07/19 1354) Pulse Rate: 57 (07/19 1354)  General: she is smiling and in good spirits Skin: right arm PICC site normal Lungs: clear Cor: regular S1 and S2 no murmurs  Lab Results SED RATE (mm/hr)  Date Value  08/17/2014 75*  07/14/2014 27  12/23/2013 125*   CRP (mg/dL)  Date Value  08/17/2014 4.5*  07/14/2014 <0.5  12/23/2013 14.6*     Assessment: Recurrent MRSA vertebral infection.  Plan: 1. Complete 8 weeks of IV vancomycin on 10/01/2014 then transition to long-term suppressive oral doxycycline 100 mg twice daily 2. Follow-up in one month   Michel Bickers, MD [facility] for [place] [phone] pager   [phone] cell 09/15/2014, 2:09 PM

## 2014-09-27 LAB — AFB CULTURE WITH SMEAR (NOT AT ARMC): Acid Fast Smear: NONE SEEN

## 2014-10-12 ENCOUNTER — Ambulatory Visit (INDEPENDENT_AMBULATORY_CARE_PROVIDER_SITE_OTHER): Payer: BLUE CROSS/BLUE SHIELD | Admitting: Internal Medicine

## 2014-10-12 VITALS — BP 136/69 | HR 53 | Temp 98.0°F | Wt 149.0 lb

## 2014-10-12 DIAGNOSIS — G061 Intraspinal abscess and granuloma: Secondary | ICD-10-CM | POA: Diagnosis not present

## 2014-10-12 DIAGNOSIS — R112 Nausea with vomiting, unspecified: Secondary | ICD-10-CM

## 2014-10-12 MED ORDER — DOXYCYCLINE HYCLATE 100 MG PO TABS: 100.0000 mg | ORAL_TABLET | Freq: Two times a day (BID) | ORAL | Status: DC

## 2014-10-12 MED ORDER — PROMETHAZINE HCL 12.5 MG PO TABS: 12.5000 mg | ORAL_TABLET | Freq: Four times a day (QID) | ORAL | Status: DC | PRN

## 2014-10-12 NOTE — Progress Notes (Signed)
Patient ID: Autumn Duran, female   DOB: 01-Jan-1951, 64 y.o.   MRN: [id number]         [facility] for Infectious Disease  Patient Active Problem List   Diagnosis Date Noted  . Right hip pain 12/23/2013    Priority: High  . Epidural abscess, L2-L5 11/04/2013    Priority: High  . Nausea with vomiting 10/12/2014  . MRSA infection   . Osteomyelitis of lumbar spine 08/13/2014  . Acute low back pain 08/13/2014  . Epidural abscess 08/13/2014  . Hypertension   . Chronic back pain   . Essential hypertension   . Other iron deficiency anemias 04/10/2014  . 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 Sepsis 09/21/2013  . Hypercalcemia 08/20/2013  . Hypokalemia 08/20/2013  . Lumbar compression fracture 07/12/2013  . Compression fracture 07/12/2013  . Benign neoplasm of colon 12/18/2011  . Esophageal reflux 12/18/2011  . Other dysphagia 12/18/2011    Patient's Medications  New Prescriptions   PROMETHAZINE (PHENERGAN) 12.5 MG TABLET    Take 1 tablet (12.5 mg total) by mouth every 6 (six) hours as needed for nausea or vomiting.  Previous Medications   DIAZEPAM (VALIUM) 5 MG TABLET    Take 1 tablet (5 mg total) by mouth every 8 (eight) hours as needed for anxiety.   OXYCODONE (OXYCONTIN) 20 MG T12A 12 HR TABLET    Take 1 tablet (20 mg total) by mouth every 12 (twelve) hours.   RABEPRAZOLE (ACIPHEX) 20 MG TABLET    Take 1 tablet (20 mg total) by mouth daily.  Modified Medications   Modified Medication Previous Medication   DOXYCYCLINE (VIBRA-TABS) 100 MG TABLET doxycycline (VIBRA-TABS) 100 MG tablet      Take 1 tablet (100 mg total) by mouth 2 (two) times daily.    Take 1 tablet (100 mg total) by mouth 2 (two) times daily.  Discontinued Medications   FUROSEMIDE (LASIX) 40 MG TABLET    Take 0.5 tablets (20 mg total) by mouth daily as needed for fluid.   HYDROCODONE-ACETAMINOPHEN (NORCO) 10-325 MG PER TABLET    Take 1 or 2 tablets every 4 hours to control pain   IBUPROFEN (ADVIL,MOTRIN) 800  MG TABLET    Take 1 tablet (800 mg total) by mouth every 8 (eight) hours as needed for mild pain.   MAGNESIUM HYDROXIDE (MILK OF MAGNESIA) 400 MG/5ML SUSPENSION    Take 30 mLs by mouth daily as needed for mild constipation.   OMEPRAZOLE (PRILOSEC) 40 MG CAPSULE    Take 40 mg by mouth daily as needed (acid reflux).    ONDANSETRON (ZOFRAN) 4 MG TABLET    Take 1 tablet (4 mg total) by mouth every 8 (eight) hours as needed for nausea or vomiting.   POTASSIUM CHLORIDE SA (K-DUR,KLOR-CON) 20 MEQ TABLET    Take 20 mEq by mouth daily as needed (supplement).    SENNA (SENOKOT) 8.6 MG TABLET    Take 1 tablet by mouth as needed.    TOBRAMYCIN-DEXAMETHASONE (TOBRADEX) OPHTHALMIC SOLUTION    Place 1 drop into the left eye every 4 (four) hours while awake.    VANCOMYCIN (VANCOCIN) 1 GM/200ML SOLN    Inject 200 mLs (1,000 mg total) into the vein every 12 (twelve) hours. Use through October 01, 2014    Subjective: Mr. Molner is in for her routine follow-up visit. She completed 8 weeks of IV vancomycin for her relapsed MRSA vertebral infection on 10/01/2014. She is back on oral doxycycline. She has had some intermittent  nausea with her morning dose of doxycycline and occasionally had some vomiting. She tried taking Zofran but did not feel like this helped. She is requesting Phenergan. Otherwise, she is feeling much better. Her right hip pain has resolved.  Review of Systems: Pertinent items are noted in HPI.  Past Medical History  Diagnosis Date  . Diverticulosis   . Hemorrhoids   . Hx of adenomatous colonic polyps   . Hiatal hernia   . Hypertension   . Hyperlipidemia   . GERD (gastroesophageal reflux disease)   . Chronic kidney disease     kidney stone s/p stent placement ( removed)  . Complication of anesthesia   . PONV (postoperative nausea and vomiting)   . Bronchitis, chronic   . Chronic back pain   . Lumbar radicular pain   . Compression fracture of L3 lumbar vertebra   . T7 vertebral fracture     . Smoldering myeloma   . Sore throat     Social History  Substance Use Topics  . Smoking status: Never Smoker   . Smokeless tobacco: Never Used  . Alcohol Use: Yes     Comment: social occ    Family History  Problem Relation Age of Onset  . Breast cancer Sister 65  . Breast cancer Other 73    aunt    Allergies  Allergen Reactions  . Morphine And Related Other (See Comments)    Headache    Objective: Filed Vitals:   10/12/14 1512  BP: 136/69  Pulse: 53  Temp: 98 F (36.7 C)  TempSrc: Oral  Weight: 149 lb (67.586 kg)   Body mass index is 28.17 kg/(m^2).  General: She is smiling and in good spirits Gaait: Normal    Problem List Items Addressed This Visit      High   Epidural abscess, L2-L5    Her recurrent vertebral infection with epidural abscess has responded to another round of IV antibiotics therapy. She will stay on chronic suppressive antibiotics. I will continue doxycycline along with low-dose Phenergan as needed. If her nausea and vomiting persist or she becomes excessively sleepy with Phenergan I will consider switching her to oral trimethoprim sulfamethoxazole. She will follow-up in 2 months      Relevant Medications   doxycycline (VIBRA-TABS) 100 MG tablet     Unprioritized   Nausea with vomiting - Primary   Relevant Medications   promethazine (PHENERGAN) 12.5 MG tablet       Michel Bickers, MD [facility] for [facility] [place] [facility] [phone] pager   [phone] cell 10/12/2014, 3:39 PM

## 2014-10-12 NOTE — Assessment & Plan Note (Signed)
Her recurrent vertebral infection with epidural abscess has responded to another round of IV antibiotics therapy. She will stay on chronic suppressive antibiotics. I will continue doxycycline along with low-dose Phenergan as needed. If her nausea and vomiting persist or she becomes excessively sleepy with Phenergan I will consider switching her to oral trimethoprim sulfamethoxazole. She will follow-up in 2 months

## 2014-11-09 ENCOUNTER — Other Ambulatory Visit (HOSPITAL_COMMUNITY): Payer: Self-pay | Admitting: Neurosurgery

## 2014-11-09 DIAGNOSIS — M4626 Osteomyelitis of vertebra, lumbar region: Secondary | ICD-10-CM

## 2014-11-17 ENCOUNTER — Ambulatory Visit (HOSPITAL_COMMUNITY)
Admission: RE | Admit: 2014-11-17 | Discharge: 2014-11-17 | Disposition: A | Discharge status: Home / Self Care | Payer: BLUE CROSS/BLUE SHIELD | Source: Ambulatory Visit | Attending: Neurosurgery | Admitting: Neurosurgery

## 2014-11-17 DIAGNOSIS — M4626 Osteomyelitis of vertebra, lumbar region: Secondary | ICD-10-CM | POA: Diagnosis not present

## 2014-11-17 DIAGNOSIS — G062 Extradural and subdural abscess, unspecified: Secondary | ICD-10-CM | POA: Diagnosis not present

## 2014-11-17 DIAGNOSIS — M4646 Discitis, unspecified, lumbar region: Secondary | ICD-10-CM | POA: Insufficient documentation

## 2014-11-17 DIAGNOSIS — Z09 Encounter for follow-up examination after completed treatment for conditions other than malignant neoplasm: Secondary | ICD-10-CM | POA: Diagnosis present

## 2014-11-17 LAB — POCT I-STAT CREATININE: Creatinine, Ser: 1.1 mg/dL — ABNORMAL HIGH (ref 0.44–1.00)

## 2014-11-17 MED ORDER — GADOBENATE DIMEGLUMINE 529 MG/ML IV SOLN
13.0000 mL | Freq: Once | INTRAVENOUS | Status: AC | PRN
Start: 2014-11-17 — End: 2014-11-17
  Administered 2014-11-17: 13 mL via INTRAVENOUS

## 2014-12-07 ENCOUNTER — Encounter (HOSPITAL_COMMUNITY): Payer: BLUE CROSS/BLUE SHIELD | Attending: Hematology & Oncology | Admitting: Hematology & Oncology

## 2014-12-07 ENCOUNTER — Encounter (HOSPITAL_COMMUNITY): Payer: BLUE CROSS/BLUE SHIELD

## 2014-12-07 VITALS — BP 151/64 | HR 61 | Temp 98.2°F | Resp 18 | Wt 150.0 lb

## 2014-12-07 DIAGNOSIS — Z780 Asymptomatic menopausal state: Secondary | ICD-10-CM

## 2014-12-07 DIAGNOSIS — M869 Osteomyelitis, unspecified: Secondary | ICD-10-CM | POA: Diagnosis not present

## 2014-12-07 DIAGNOSIS — M4626 Osteomyelitis of vertebra, lumbar region: Secondary | ICD-10-CM

## 2014-12-07 DIAGNOSIS — M810 Age-related osteoporosis without current pathological fracture: Secondary | ICD-10-CM | POA: Diagnosis not present

## 2014-12-07 DIAGNOSIS — D509 Iron deficiency anemia, unspecified: Secondary | ICD-10-CM

## 2014-12-07 DIAGNOSIS — C9030 Solitary plasmacytoma not having achieved remission: Secondary | ICD-10-CM

## 2014-12-07 DIAGNOSIS — G8929 Other chronic pain: Secondary | ICD-10-CM | POA: Diagnosis not present

## 2014-12-07 LAB — CBC WITH DIFFERENTIAL/PLATELET
Basophils Absolute: 0.1 10*3/uL (ref 0.0–0.1)
Basophils Relative: 1 %
Eosinophils Absolute: 0.1 10*3/uL (ref 0.0–0.7)
Eosinophils Relative: 3 %
HCT: 45.6 % (ref 36.0–46.0)
Hemoglobin: 15 g/dL (ref 12.0–15.0)
Lymphocytes Relative: 33 %
Lymphs Abs: 1.6 10*3/uL (ref 0.7–4.0)
MCH: 28.1 pg (ref 26.0–34.0)
MCHC: 32.9 g/dL (ref 30.0–36.0)
MCV: 85.6 fL (ref 78.0–100.0)
Monocytes Absolute: 0.2 10*3/uL (ref 0.1–1.0)
Monocytes Relative: 5 %
Neutro Abs: 2.8 10*3/uL (ref 1.7–7.7)
Neutrophils Relative %: 58 %
Platelets: 257 10*3/uL (ref 150–400)
RBC: 5.33 MIL/uL — ABNORMAL HIGH (ref 3.87–5.11)
RDW: 13.7 % (ref 11.5–15.5)
WBC: 4.8 10*3/uL (ref 4.0–10.5)

## 2014-12-07 LAB — COMPREHENSIVE METABOLIC PANEL
ALT: 13 U/L — ABNORMAL LOW (ref 14–54)
AST: 24 U/L (ref 15–41)
Albumin: 4.2 g/dL (ref 3.5–5.0)
Alkaline Phosphatase: 93 U/L (ref 38–126)
Anion gap: 7 (ref 5–15)
BUN: 14 mg/dL (ref 6–20)
CO2: 25 mmol/L (ref 22–32)
Calcium: 9.8 mg/dL (ref 8.9–10.3)
Chloride: 104 mmol/L (ref 101–111)
Creatinine, Ser: 0.75 mg/dL (ref 0.44–1.00)
GFR calc Af Amer: >60 mL/min (ref >60)
GFR calc non Af Amer: >60 mL/min (ref >60)
Glucose, Bld: 86 mg/dL (ref 65–99)
Potassium: 4.3 mmol/L (ref 3.5–5.1)
Sodium: 136 mmol/L (ref 135–145)
Total Bilirubin: 0.7 mg/dL (ref 0.3–1.2)
Total Protein: 7.6 g/dL (ref 6.5–8.1)

## 2014-12-07 NOTE — Progress Notes (Signed)
Autumn Duran presented for labwork. Labs per MD order drawn via Peripheral Line 23 gauge needle inserted in right antecubital.  Good blood return present. Procedure without incident.  Needle removed intact. Patient tolerated procedure well.

## 2014-12-07 NOTE — Progress Notes (Signed)
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 On 01/06/2014 she was seen by infectious disease who recommended continuing doxycycline therapy.  Osteomyelitis lumbar spine L2-L5 MRSA infection Relapsed infection 10/01/2014 8 weeks IV Vancomycin  Diagnosis Bone, biopsy, lumbar three - LAMELLAR BONE WITH ASSOCIATED FIBROUS TISSUE AND INCREASED PLASMA CELLS. - THERE IS NO EVIDENCE OF MALIGNANCY. - SEE COMMENT. Microscopic Comment Pending immunohistochemical stains. Diagnosis Note There are scattered plasma cells (approximately 5-10%), however, no aggregates are seen. Light chain immunohistochemistry is technically unsatisfactory. Pancytokeratin is negative. Correlation with clinical, radiographic, and laboratory data (SPEP, etc.) is recommended. Vicente Males MD Pathologist, Electronic Signature (Case signed 07/18/2013)  Hypercalcemia mild, normal PTH  Microcytic anemia, Colonoscopy and EGD 11/2011. History of multiple polyps (tubular adenomas, serrated adenomas, hyperplastic polyps)  No evidence of an M spike, normal immunoglobulin levels, normal Kappa/Lambda ratio  Bone marrow biopsy and aspirate on 03/26/2014 showing no diagnostic morphologic evidence of plasma cell neoplasm. Normocellular to slightly hypercellular marrow particles with maturing trilineage hematopoiesis, and mild nonspecific dyserythroipoiesis. No significant iron stores  Iron deficiency anemia, colonoscopy in 2013 with polyps and internal hemorrhoids, on recall in 2018. EGD in 2013 with irregular Z line and stricture. Pathology without evidence of  Barrett's  IV iron replacement 2/19, 2/26, 3/4   CURRENT THERAPY: Observation  INTERVAL HISTORY: Autumn Duran 64 y.o. 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  She has osteomyelitis and continues on doxycycline daily. She realistically has few complaints today. Her mood is good.   Autumn Duran is here with her sister. She continues to experience back, hip, and joint pain more on the right than the left, taking pain medication regularly to manage it. The pain limits her from doing things she enjoys. She is afraid of taking pain medications chronically as she if afraid of becoming addicted. She would like to participate in physical therapy. She experiences discomfort when first standing but is okay "once she gets going". She denies any recent falls.  She continues to take doxycycline twice daily with food. This morning she forgot to eat when taking the doxycycline and vomited.  She complains of a knot on her back that makes it difficult for her to sleep on her back.She recently took a long trip to the beach and noted the back pain after sitting in the care for several hours.  She had a bone density test in [place] a few years ago. She states that she has osteoporosis. Her previous doctor mentioned having infusions administered.  She has not received a flu shot this year. She does not want one after her mother got sick from it.  The patient does not take calcium supplements because she is afraid they will worsen her kidney stones.   MEDICAL HISTORY: Past Medical History  Diagnosis Date  . Diverticulosis   . Hemorrhoids   . Hx of adenomatous colonic polyps   . Hiatal hernia   . Hypertension   . Hyperlipidemia   . GERD (gastroesophageal reflux disease)   . Chronic kidney disease     kidney stone s/p stent placement ( removed)  . Complication of anesthesia   .  PONV (postoperative nausea and vomiting)   . Bronchitis, chronic   . Chronic back pain   . Lumbar radicular pain     . Compression fracture of L3 lumbar vertebra   . T7 vertebral fracture   . Smoldering myeloma   . Sore throat     has Benign neoplasm of colon; Esophageal reflux; Other dysphagia; Lumbar compression fracture ([place]); Compression fracture; Hypercalcemia; Hypokalemia; Sepsis ([place]); Epidural abscess, L2-L5; Right hip pain; 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[place]); Other iron deficiency anemias; Osteomyelitis of lumbar spine ([place]); Hypertension; Chronic back pain; Acute low back pain; Epidural abscess; Essential hypertension; MRSA infection; and Nausea with vomiting on her problem list.     No history exists.     is allergic to morphine and related.  Autumn Duran had no medications administered during this visit.  SURGICAL HISTORY: Past Surgical History  Procedure Laterality Date  . Cholecystectomy    . Esophagogastroduodenoscopy  12/18/2011    Procedure: ESOPHAGOGASTRODUODENOSCOPY (EGD);  Surgeon: Lafayette Dragon, MD;  Location: [facility];  Service: Endoscopy;  Laterality: N/A;  . Colonoscopy  12/18/2011    Procedure: COLONOSCOPY;  Surgeon: Lafayette Dragon, MD;  Location: [facility];  Service: Endoscopy;  Laterality: N/A;  . Ureteral stent placement    . Vertebroplasty N/A 07/14/2013    Procedure: VERTEBROPLASTY WITH LUMBAR THREE BIOPSY;  Surgeon: Charlie Pitter, MD;  Location: [facility];  Service: Neurosurgery;  Laterality: N/A;  VERTEBROPLASTY WITH LUMBAR THREE BIOPSY  . Back surgery    . Lumbar laminectomy for epidural abscess Left 09/22/2013    Procedure: LUMBAR LAMINECTOMY FOR EPIDURAL ABSCESS LEFT LUMBAR FIVE-SACRAL ONE;  Surgeon: Charlie Pitter, MD;  Location: [facility];  Service: Neurosurgery;  Laterality: Left;  . Bone marrow biopsy Left 02/2014  . Bone marrow aspiration Left 02/2014  . Radiology with anesthesia N/A 08/13/2014    Procedure: RADIOLOGY WITH ANESTHESIA ;  Surgeon: Medication Radiologist, MD;  Location: [facility];  Service: Radiology;  Laterality: N/A;    SOCIAL HISTORY: Social  History   Social History  . Marital Status: Single    Spouse Name: N/A  . Number of Children: N/A  . Years of Education: N/A   Occupational History  . Clerk     [employer]   Social History Main Topics  . Smoking status: Never Smoker   . Smokeless tobacco: Never Used  . Alcohol Use: Yes     Comment: social occ  . Drug Use: No  . Sexual Activity: Not on file   Other Topics Concern  . Not on file   Social History Narrative    FAMILY HISTORY: Family History  Problem Relation Age of Onset  . Breast cancer Sister 53  . Breast cancer Other 74    aunt    Review of Systems  Constitutional: Negative for malaise/fatigue. Negative for fever, chills and weight loss.  HENT: Negative for congestion, hearing loss, nosebleeds, sore throat and tinnitus.   Eyes: Negative for blurred vision, double vision, pain and discharge.  Respiratory: Negative for cough, hemoptysis, sputum production, shortness of breath and wheezing.   Cardiovascular: Negative for chest pain, palpitations, claudication, leg swelling and PND.  Gastrointestinal: Negative for heartburn, nausea, vomiting, abdominal pain, diarrhea, constipation, blood in stool and melena.  Genitourinary: Negative for dysuria, urgency, frequency and hematuria.  Musculoskeletal: Positive for back pain, hip pain, and joint pain. Negative for myalgias and falls.  Skin: Negative for itching and rash.  Neurological: Negative for dizziness, tingling, tremors,  sensory change, speech change, focal weakness, seizures, loss of consciousness, weakness and headaches.  Endo/Heme/Allergies: Does not bruise/bleed easily.  Psychiatric/Behavioral: Negative for depression, suicidal ideas, memory loss and substance abuse. The patient is not nervous/anxious and does not have insomnia.   14 point review of systems was performed and is negative except as detailed under history of present illness and above   PHYSICAL EXAMINATION  ECOG PERFORMANCE STATUS:  1  Filed Vitals:   12/07/14 1420  BP: 151/64  Pulse: 61  Temp: 98.2 F (36.8 C)  Resp: 18    Physical Exam  Constitutional: She is oriented to person, place, and time and well-developed, well-nourished, and in no distress. Well groomed HENT:  Head: Normocephalic and atraumatic.  Nose: Nose normal.  Mouth/Throat: Oropharynx is clear and moist. No oropharyngeal exudate.  Eyes: Conjunctivae and EOM are normal. Pupils are equal, round, and reactive to light. Right eye exhibits no discharge. Left eye exhibits no discharge. No scleral icterus.  Neck: Normal range of motion. Neck supple. No tracheal deviation present. No thyromegaly present.  Cardiovascular: Normal rate, regular rhythm and normal heart sounds.  Exam reveals no gallop and no friction rub.   No murmur heard. Pulmonary/Chest: Effort normal and breath sounds normal. She has no wheezes. She has no rales.  Abdominal: Soft. Bowel sounds are normal. She exhibits no distension and no mass. There is no tenderness. There is no rebound and no guarding.  Musculoskeletal: She exhibits no edema.  Lymphadenopathy:    She has no cervical adenopathy.  Neurological: She is alert and oriented to person, place, and time. She has normal reflexes. No cranial nerve deficit. Coordination normal.  Skin: Skin is warm and dry. No rash noted.  Psychiatric: Mood, memory, affect and judgment normal.  Nursing note and vitals reviewed.   LABORATORY DATA: I have reviewed the labs below. Results for JEREMIE, ABDELAZIZ (MRN [id number]) a  Ref. Range 12/07/2014 14:30 12/07/2014 14:30  Sodium Latest Ref Range: 135-145 mmol/L 136   Potassium Latest Ref Range: 3.5-5.1 mmol/L 4.3   Chloride Latest Ref Range: 101-111 mmol/L 104   CO2 Latest Ref Range: 22-32 mmol/L 25   BUN Latest Ref Range: 6-20 mg/dL 14   Creatinine Latest Ref Range: 0.44-1.00 mg/dL 0.75   Calcium Latest Ref Range: 8.9-10.3 mg/dL 9.8   EGFR (Non-African Amer.) Latest Ref Range: >60 mL/min  >60   EGFR (African American) Latest Ref Range: >60 mL/min >60   Glucose Latest Ref Range: 65-99 mg/dL 86   Anion gap Latest Ref Range: 5-15  7   Alkaline Phosphatase Latest Ref Range: 38-126 U/L 93   Albumin Latest Ref Range: 3.5-5.0 g/dL 4.2   AST Latest Ref Range: 15-41 U/L 24   ALT Latest Ref Range: 14-54 U/L 13 (L)   Total Protein Latest Ref Range: 6.5-8.1 g/dL 7.6   Total Bilirubin Latest Ref Range: 0.3-1.2 mg/dL 0.7   Total Protein ELP Latest Ref Range: 6.0-8.5 g/dL 6.8 6.7  Albumin ELP Latest Ref Range: 2.9-4.4 g/dL 3.6   Globulin, Total Latest Ref Range: 2.2-3.9 g/dL 3.2   A/G Ratio Latest Ref Range: 0.7-1.7  1.1   Alpha-1 Glubulin Latest Ref Range: 0.0-0.4 g/dL 0.3   Alpha-2 Globulin Latest Ref Range: 0.4-1.0 g/dL 0.6   Beta Globulin Latest Ref Range: 0.7-1.3 g/dL 1.0   Gamma Globulin Latest Ref Range: 0.4-1.8 g/dL 1.3   M-SPIKE, % Latest Ref Range: Not Observed g/dL Not Observed   SPE Interp. Unknown Comment   Comment Unknown Comment  IgG (Immunoglobin G), Serum Latest Ref Range: [phone] mg/dL 1229 1203  IgA Latest Ref Range: 87-352 mg/dL 220 221  IgM, Serum Latest Ref Range: 26-217 mg/dL 120 122  WBC Latest Ref Range: 4.0-10.5 K/uL 4.8   RBC Latest Ref Range: 3.87-5.11 MIL/uL 5.33 (H)   Hemoglobin Latest Ref Range: 12.0-15.0 g/dL 15.0   HCT Latest Ref Range: 36.0-46.0 % 45.6   MCV Latest Ref Range: 78.0-100.0 fL 85.6   MCH Latest Ref Range: 26.0-34.0 pg 28.1   MCHC Latest Ref Range: 30.0-36.0 g/dL 32.9   RDW Latest Ref Range: 11.5-15.5 % 13.7   Platelets Latest Ref Range: 150-400 K/uL 257   Neutrophils Latest Units: % 58   Lymphocytes Latest Units: % 33   Monocytes Relative Latest Units: % 5   Eosinophil Latest Units: % 3   Basophil Latest Units: % 1   NEUT# Latest Ref Range: 1.7-7.7 K/uL 2.8   Lymphocyte # Latest Ref Range: 0.7-4.0 K/uL 1.6   Monocyte # Latest Ref Range: 0.1-1.0 K/uL 0.2   Eosinophils Absolute Latest Ref Range: 0.0-0.7 K/uL 0.1   Basophils  Absolute Latest Ref Range: 0.0-0.1 K/uL 0.1   Immunofixation Result, Serum Unknown  Comment    ASSESSMENT and THERAPY PLAN:   Plasmacytoma Normal bone marrow biopsy No M spike  She received radiation to the lumbar spine. Bone marrow biopsy has been performed showed no evidence of a plasma cell neoplasm. Bone marrow biopsy has normal cytogenetics. Kappa lambda light chain ratio has been normal, SPEP/IEP has been normal. I recommended ongoing observation and monitoring of laboratory studies. We will see her back in 3 months.  Iron deficiency anemia  Colonoscopy was performed in October 2013. 8 polyps were removed. Pathology was consistent with tubular adenomas, hyperplastic polyps, and 3 serrated adenomas. No dysplasia or malignancy was found. She is due for recall in 2018. We will check iron levels again in 3 months.  Chronic Back Pain Osteomyelitis  We can refer to PT In addition if her pain does not improve we could consider referral to pain medicine.  She continues to follow with ID.   She is currently on [employer] after being terminated from her job. This insurance will only last until February. She will turn 37 in September.  History of Osteoporosis  She has a hard time tolerating calcium pills. We discussed caltrate gummies as an easy to take calcium supplement and that calcium supplements would not worsen her kidney stones.  I have ordered for a bone density test to be done before the end of the year, we will follow up to discuss these results.  All questions were answered. The patient knows to call the clinic with any problems, questions or concerns. We can certainly see the patient much sooner if necessary.   This note was electronically signed by:  This document serves as a record of services personally performed by Ancil Linsey, MD. It was created on her behalf by Arlyce Harman, a trained medical scribe. The creation of this record is based on the scribe's  personal observations and the provider's statements to them. This document has been checked and approved by the attending provider.  I have reviewed the above documentation for accuracy and completeness, and I agree with the above.  Kelby Fam. Yasemin Rabon, MD 12/07/2014

## 2014-12-07 NOTE — Patient Instructions (Signed)
[place] at [facility] Discharge Instructions  RECOMMENDATIONS MADE BY THE CONSULTANT AND ANY TEST RESULTS WILL BE SENT TO YOUR REFERRING PHYSICIAN.  Lab work today as ordered. Exam per Dr.Penland. We will make a referral for Physical Therapy, someone from their office will call to schedule an appointment with you. Return as scheduled.  Thank you for choosing [place] at [facility] to provide your oncology and hematology care.  To afford each patient quality time with our provider, please arrive at least 15 minutes before your scheduled appointment time.    You need to re-schedule your appointment should you arrive 10 or more minutes late.  We strive to give you quality time with our providers, and arriving late affects you and other patients whose appointments are after yours.  Also, if you no show three or more times for appointments you may be dismissed from the clinic at the providers discretion.     Again, thank you for choosing [facility].  Our hope is that these requests will decrease the amount of time that you wait before being seen by our physicians.       _____________________________________________________________  Should you have questions after your visit to [facility], please contact our office at [phone] between the hours of 8:30 a.m. and 4:30 p.m.  Voicemails left after 4:30 p.m. will not be returned until the following business day.  For prescription refill requests, have your pharmacy contact our office.

## 2014-12-08 LAB — IMMUNOFIXATION ELECTROPHORESIS
IgA: 221 mg/dL (ref 87–352)
IgG (Immunoglobin G), Serum: 1203 mg/dL (ref 700–1600)
IgM, Serum: 122 mg/dL (ref 26–217)
Total Protein ELP: 6.7 g/dL (ref 6.0–8.5)

## 2014-12-08 LAB — PROTEIN ELECTROPHORESIS, SERUM
A/G Ratio: 1.1 (ref 0.7–1.7)
Albumin ELP: 3.6 g/dL (ref 2.9–4.4)
Alpha-1-Globulin: 0.3 g/dL (ref 0.0–0.4)
Alpha-2-Globulin: 0.6 g/dL (ref 0.4–1.0)
Beta Globulin: 1 g/dL (ref 0.7–1.3)
Gamma Globulin: 1.3 g/dL (ref 0.4–1.8)
Globulin, Total: 3.2 g/dL (ref 2.2–3.9)
Total Protein ELP: 6.8 g/dL (ref 6.0–8.5)

## 2014-12-08 LAB — IGG, IGA, IGM
IgA: 220 mg/dL (ref 87–352)
IgG (Immunoglobin G), Serum: 1229 mg/dL (ref 700–1600)
IgM, Serum: 120 mg/dL (ref 26–217)

## 2014-12-08 NOTE — Progress Notes (Signed)
See office visit notes. 

## 2014-12-09 ENCOUNTER — Encounter: Payer: Self-pay | Admitting: Internal Medicine

## 2014-12-09 ENCOUNTER — Encounter (HOSPITAL_COMMUNITY): Payer: Self-pay | Admitting: Hematology & Oncology

## 2014-12-10 ENCOUNTER — Ambulatory Visit (INDEPENDENT_AMBULATORY_CARE_PROVIDER_SITE_OTHER): Payer: BLUE CROSS/BLUE SHIELD | Admitting: Internal Medicine

## 2014-12-10 ENCOUNTER — Encounter: Payer: Self-pay | Admitting: Internal Medicine

## 2014-12-10 DIAGNOSIS — G061 Intraspinal abscess and granuloma: Secondary | ICD-10-CM

## 2014-12-10 LAB — C-REACTIVE PROTEIN: CRP: <0.5 mg/dL (ref <0.60)

## 2014-12-10 NOTE — Assessment & Plan Note (Signed)
She continues to improve on antibiotic therapy for her relapse to lumbar infection. Both bouts of infection have been very severe and debilitating. She is very reluctant to consider stopping antibiotic therapy. She is tolerating doxycycline reasonably well. Her recent MRI shows improvement. We both agree that it is best to continue doxycycline for now. I will repeat her inflammatory markers and see her back in 2 months.

## 2014-12-10 NOTE — Progress Notes (Signed)
Patient ID: Autumn Duran, female   DOB: 03/29/1950, 64 y.o.   MRN: [id number]         [facility] for Infectious Disease  Patient Active Problem List   Diagnosis Date Noted  . Right hip pain 12/23/2013    Priority: High  . Epidural abscess, L2-L5 11/04/2013    Priority: High  . Nausea with vomiting 10/12/2014  . MRSA infection   . Osteomyelitis of lumbar spine ([place]) 08/13/2014  . Acute low back pain 08/13/2014  . Epidural abscess 08/13/2014  . Hypertension   . Chronic back pain   . Essential hypertension   . Other iron deficiency anemias 04/10/2014  . 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[place]) 03/23/2014  . Sepsis ([place]) 09/21/2013  . Hypercalcemia 08/20/2013  . Hypokalemia 08/20/2013  . Lumbar compression fracture ([place]) 07/12/2013  . Compression fracture 07/12/2013  . Benign neoplasm of colon 12/18/2011  . Esophageal reflux 12/18/2011  . Other dysphagia 12/18/2011    Patient's Medications  New Prescriptions   No medications on file  Previous Medications   DIAZEPAM (VALIUM) 5 MG TABLET    Take 1 tablet (5 mg total) by mouth every 8 (eight) hours as needed for anxiety.   DOXYCYCLINE (VIBRA-TABS) 100 MG TABLET    Take 1 tablet (100 mg total) by mouth 2 (two) times daily.   OXYCODONE (OXYCONTIN) 20 MG T12A 12 HR TABLET    Take 1 tablet (20 mg total) by mouth every 12 (twelve) hours.   PROMETHAZINE (PHENERGAN) 12.5 MG TABLET    Take 1 tablet (12.5 mg total) by mouth every 6 (six) hours as needed for nausea or vomiting.   RABEPRAZOLE (ACIPHEX) 20 MG TABLET    Take 1 tablet (20 mg total) by mouth daily.  Modified Medications   No medications on file  Discontinued Medications   No medications on file    Subjective: Autumn Duran is in for her routine follow-up visit. She is now completed 4 months of antibiotic therapy for her first relapse of MRSA lumbar infection. She continues to have occasional nausea and vomiting with her morning dose of doxycycline. However this is only occurred twice  in the past month. She finds that if she eats a full breakfast she is not bothered by nausea and vomiting. She has no problem tolerating her evening dose. She has been careful to wear sunscreen. She has not had any fever, chills or sweats. She's not had any diarrhea. Her back pain continues to slowly improve. She is requiring less pain medication and she has been able to be more active.  Review of Systems: Pertinent items are noted in HPI.  Past Medical History  Diagnosis Date  . Diverticulosis   . Hemorrhoids   . Hx of adenomatous colonic polyps   . Hiatal hernia   . Hypertension   . Hyperlipidemia   . GERD (gastroesophageal reflux disease)   . Chronic kidney disease     kidney stone s/p stent placement ( removed)  . Complication of anesthesia   . PONV (postoperative nausea and vomiting)   . Bronchitis, chronic ([place])   . Chronic back pain   . Lumbar radicular pain   . Compression fracture of L3 lumbar vertebra ([facility])   . T7 vertebral fracture ([place])   . Smoldering myeloma ([place])   . Sore throat     Social History  Substance Use Topics  . Smoking status: Never Smoker   . Smokeless tobacco: Never Used  . Alcohol Use: 0.0 oz/week  0 Standard drinks or equivalent per week     Comment: social occ    Family History  Problem Relation Age of Onset  . Breast cancer Sister 58  . Breast cancer Other 39    aunt    Allergies  Allergen Reactions  . Morphine And Related Other (See Comments)    Headache    Objective: Filed Vitals:   12/10/14 1438  BP: 146/81  Pulse: 59  Temp: 97.7 F (36.5 C)  TempSrc: Oral  Height: 5' 1.5" (1.562 m)  Weight: 148 lb (67.132 kg)   Body mass index is 27.51 kg/(m^2).  General: She is smiling and in good spirits Skin: A few red spots on right forearm that developed after she recently went to the beach. She has been wearing sunscreen Lungs: Clear Cor: Regular S1 and S2 with no murmur No focal tenderness over her lumbar spine  Lab  Results SED RATE (mm/hr)  Date Value  08/17/2014 75*  07/14/2014 27  12/23/2013 125*   CRP (mg/dL)  Date Value  08/17/2014 4.5*  07/14/2014 <0.5  12/23/2013 14.6*   Lumbar MRI 11/18/2014   IMPRESSION: 1. L2-L3 discitis osteomyelitis with positive response to treatment since 08/13/2014. Confluent right psoas muscle inflammation but no intramuscular abscess and resolved small ventral epidural abscess. Continued marrow edema and enhancement. 2. Increased marrow edema and enhancement of the left L5-S1 facets may be associated with a pathologic L5 pars fracture which is new since 2015. However, no associated fluid collection to indicate continued or recurrent spinal infection at this site. Regional postoperative enhancing granulation tissue.   Electronically Signed  By: Genevie Ann M.D.  On: 11/18/2014 08:10   Problem List Items Addressed This Visit      High   Epidural abscess, L2-L5    She continues to improve on antibiotic therapy for her relapse to lumbar infection. Both bouts of infection have been very severe and debilitating. She is very reluctant to consider stopping antibiotic therapy. She is tolerating doxycycline reasonably well. Her recent MRI shows improvement. We both agree that it is best to continue doxycycline for now. I will repeat her inflammatory markers and see her back in 2 months.      Relevant Orders   C-reactive protein   Sedimentation rate       Michel Bickers, MD [facility] for [facility] [place] [phone] pager   [phone] cell 12/10/2014, 2:54 PM

## 2014-12-11 ENCOUNTER — Ambulatory Visit (HOSPITAL_COMMUNITY): Payer: BLUE CROSS/BLUE SHIELD | Attending: Hematology & Oncology | Admitting: Physical Therapy

## 2014-12-11 DIAGNOSIS — R29898 Other symptoms and signs involving the musculoskeletal system: Secondary | ICD-10-CM

## 2014-12-11 DIAGNOSIS — R198 Other specified symptoms and signs involving the digestive system and abdomen: Secondary | ICD-10-CM | POA: Diagnosis present

## 2014-12-11 DIAGNOSIS — Z7409 Other reduced mobility: Secondary | ICD-10-CM | POA: Diagnosis not present

## 2014-12-11 LAB — SEDIMENTATION RATE: Sed Rate: 6 mm/h (ref 0–30)

## 2014-12-11 NOTE — Therapy (Signed)
[place], [place], [address] Phone: [phone]   Fax:  [phone]  Physical Therapy Evaluation  Patient Details  Name: Autumn Duran MRN: [id number] Date of Birth: September 28, 1950 No Data Recorded  Encounter Date: 12/11/2014      PT End of Session - 12/11/14 1632    Visit Number 1   Number of Visits 10   Date for PT Re-Evaluation 01/08/15   Authorization Time Period 12/11/14-02/08/15   PT Start Time 1345   PT Stop Time 1430   PT Time Calculation (min) 45 min   Equipment Utilized During Treatment Gait belt   Activity Tolerance Patient tolerated treatment well   Behavior During Therapy [facility] for tasks assessed/performed      Past Medical History  Diagnosis Date  . Diverticulosis   . Hemorrhoids   . Hx of adenomatous colonic polyps   . Hiatal hernia   . Hypertension   . Hyperlipidemia   . GERD (gastroesophageal reflux disease)   . Chronic kidney disease     kidney stone s/p stent placement ( removed)  . Complication of anesthesia   . PONV (postoperative nausea and vomiting)   . Bronchitis, chronic ([place])   . Chronic back pain   . Lumbar radicular pain   . Compression fracture of L3 lumbar vertebra (HCC)   . T7 vertebral fracture ([place])   . Smoldering myeloma ([place])   . Sore throat     Past Surgical History  Procedure Laterality Date  . Cholecystectomy    . Esophagogastroduodenoscopy  12/18/2011    Procedure: ESOPHAGOGASTRODUODENOSCOPY (EGD);  Surgeon: Lafayette Dragon, MD;  [facility];  Service: Endoscopy;  Laterality: N/A;  . Colonoscopy  12/18/2011    Procedure: COLONOSCOPY;  Surgeon: Lafayette Dragon, MD;  [facility];  Service: Endoscopy;  Laterality: N/A;  . Ureteral stent placement    . Vertebroplasty N/A 07/14/2013    Procedure: VERTEBROPLASTY WITH LUMBAR THREE BIOPSY;  Surgeon: Charlie Pitter, MD;  Location: [place] [facility];  Service: Neurosurgery;  Laterality: N/A;  VERTEBROPLASTY  WITH LUMBAR THREE BIOPSY  . Back surgery    . Lumbar laminectomy for epidural abscess Left 09/22/2013    Procedure: LUMBAR LAMINECTOMY FOR EPIDURAL ABSCESS LEFT LUMBAR FIVE-SACRAL ONE;  Surgeon: Charlie Pitter, MD;  Location: [place] [facility];  Service: Neurosurgery;  Laterality: Left;  . Bone marrow biopsy Left 02/2014  . Bone marrow aspiration Left 02/2014  . Radiology with anesthesia N/A 08/13/2014    Procedure: RADIOLOGY WITH ANESTHESIA ;  Surgeon: Medication Radiologist, MD;  Location: [place] [facility];  Service: Radiology;  Laterality: N/A;    There were no vitals filed for this visit.  Visit Diagnosis:  Impaired functional mobility and activity tolerance - Plan: PT plan of care cert/re-cert  Weakness of both legs - Plan: PT plan of care cert/re-cert  Abdominal weakness - Plan: PT plan of care cert/re-cert      Subjective Assessment - 12/11/14 1350    Subjective Pt underwent hemilaminectomy last April, and experienced several infections since that time. She went into the hospital on June 16th for MRSA, and was on PICC line for 8 weeks. She is now on antibiotics to deal with the infection. She now reports that her legs are weak, she is fearful of bending, squatting, or lifting, and that she has not been as mobile as she used to be. Pt's biggest complaint is that she is not as mobile as she used  to be, and she feels her endurance has decreased.    Pertinent History Decompression laminectomy on 09/22/2013 with MRSA epidural abscess; relapse of MRSA infection on 10/01/14.    How long can you sit comfortably? no limitations   How long can you stand comfortably? 30 minutes before fatigue   How long can you walk comfortably? 20 minutes before fatigue   Patient Stated Goals Improve mobility and endurance   Currently in Pain? No/denies   Pain Score 0-No pain  pt has taken pain pill            [facility] PT Assessment - 12/11/14 0001    Assessment   Medical Diagnosis Osteomyelitis of lumbar spine   Next  MD Visit [place]- February. Bone density test- 12/14/14   Precautions   Precautions None   Balance Screen   Has the patient fallen in the past 6 months No   Has the patient had a decrease in activity level because of a fear of falling?  No   Is the patient reluctant to leave their home because of a fear of falling?  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   Observation/Other Assessments   Other Surveys  Other Surveys  FACIT total: 109   Functional Tests   Functional tests Single leg stance   Single Leg Stance   Comments 20" bilaterally   ROM / Strength   AROM / PROM / Strength AROM;Strength   AROM   AROM Assessment Site Lumbar;Hip   Right/Left Hip Right;Left   Right Hip External Rotation  36   Right Hip Internal Rotation  35   Left Hip External Rotation  37   Left Hip Internal Rotation  34   Lumbar Flexion 85   Lumbar Extension 23   Lumbar - Right Side Bend 18   Lumbar - Left Side Bend 29   Strength   Strength Assessment Site Hip;Knee   Right/Left Hip Right;Left   Right Hip Flexion 4/5   Right Hip Extension 3/5   Right Hip ABduction 3+/5   Left Hip Flexion 4/5   Left Hip Extension 3/5   Left Hip ABduction 4/5   Right/Left Knee Right;Left   Right Knee Flexion 4/5   Right Knee Extension 4+/5   Left Knee Flexion 4/5   Left Knee Extension 4+/5   Transfers   Five time sit to stand comments  15.08   6 minute walk test results    Endurance additional comments 1,200 ft  1.0 m/s                           PT Education - 12/11/14 1631    Education provided Yes   Education Details Given STAR booklet, educated on use of  pedometer   Person(s) Educated Patient   Methods Explanation;Handout   Comprehension Verbalized understanding;Returned demonstration          PT Short Term Goals - 12/11/14 1641    PT SHORT TERM GOAL #1   Title Pt will be independent with HEP.   Time 2   Period Weeks   Status New   PT  SHORT TERM GOAL #2   Title Improve hip extensor and abductor strength to 4/5 or greater to improve gait mechanics.    Time 2   Period Weeks   Status New   PT SHORT TERM GOAL #3   Title Pt will complete five time sit to stand in 13 seconds or less to demonstrate improved functional mobility.    Time 2   Period Weeks   Status New           PT Long Term Goals - 12/11/14 1643    PT LONG TERM GOAL #1   Title Pt will be independent with advanced HEP.    Time 4   Period Weeks   Status New   PT LONG TERM GOAL #2   Title Pt will complete five time sit to stand in 11 seconds or less to demonstrate improved functional mobility.   Time 4   Period Weeks   Status New   PT LONG TERM GOAL #3   Title Improve hip abductor and extensor strength to 4+/5 or greater to improve gait mechanics.    Time 4   Period Weeks   Status New   PT LONG TERM GOAL #4   Title Pt will report that she is ambulating 30 minutes or more per day to improve functional activity tolerance and endurance.    Time 4   Period Weeks   Status New               Plan - 12/11/14 1637    Clinical Impression Statement Pt presents to PT following MRSA infection that has been treated with IV and oral antibiotics. Pt underwent laminectomy last year due to plasmacytoma and multiple myeloma in L3 vertebra. She now presents with decreased BLE strength, impaired functional activity tolerance, impaired balance, and impaired functional mobility. Pt will benefit from physical therapy services at this time to improve strength, functional activity tolerance, and mobility in order to return pt to PLOF. Pt was given a pedometer and a STAR  booklet and was encouraged to keep track of her steps every day.    Pt will benefit from skilled therapeutic intervention in order to improve on the following deficits Abnormal gait;Decreased activity tolerance;Decreased endurance;Decreased mobility;Decreased range of motion;Decreased strength;Difficulty walking   Rehab Potential Good   PT Frequency 2x / week   PT Duration 4 weeks   PT Treatment/Interventions ADLs/Self Care Home Management;Gait training;Stair training;Functional mobility training;Therapeutic activities;Therapeutic exercise;Balance training;Neuromuscular re-education;Patient/family education;Manual techniques;Energy conservation   PT Next Visit Plan Follow up regarding pedometer use, begin core strengthening program and BLE strengthening         Problem List Patient Active Problem List   Diagnosis Date Noted  . Nausea with vomiting 10/12/2014  . MRSA infection   . Osteomyelitis of lumbar spine ([place]) 08/13/2014  . Acute low back pain 08/13/2014  . Epidural abscess 08/13/2014  . Hypertension   . Chronic back pain   . Essential hypertension   . Other iron deficiency anemias 04/10/2014  . 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 Right hip pain 12/23/2013  . Epidural abscess, L2-L5 11/04/2013  . Sepsis ([place]) 09/21/2013  . Hypercalcemia 08/20/2013  . Hypokalemia 08/20/2013  . Lumbar compression fracture ([place]) 07/12/2013  . Compression fracture 07/12/2013  . Benign neoplasm of colon 12/18/2011  . Esophageal reflux 12/18/2011  . Other dysphagia 12/18/2011    Hilma Favors, PT, DPT [phone] 12/11/2014, 4:49 PM  [place] [address]  [place], [place], [address] Phone: [phone]   Fax:  [phone]  Name: Autumn Duran MRN: [id number] Date of Birth: 1950/08/18

## 2014-12-14 ENCOUNTER — Ambulatory Visit (HOSPITAL_COMMUNITY)
Admission: RE | Admit: 2014-12-14 | Discharge: 2014-12-14 | Disposition: A | Discharge status: Home / Self Care | Payer: BLUE CROSS/BLUE SHIELD | Source: Ambulatory Visit | Attending: Hematology & Oncology | Admitting: Hematology & Oncology

## 2014-12-14 DIAGNOSIS — M4626 Osteomyelitis of vertebra, lumbar region: Secondary | ICD-10-CM | POA: Insufficient documentation

## 2014-12-14 DIAGNOSIS — C9030 Solitary plasmacytoma not having achieved remission: Secondary | ICD-10-CM | POA: Insufficient documentation

## 2014-12-14 DIAGNOSIS — Z780 Asymptomatic menopausal state: Secondary | ICD-10-CM | POA: Diagnosis not present

## 2014-12-16 ENCOUNTER — Ambulatory Visit (HOSPITAL_COMMUNITY): Payer: BLUE CROSS/BLUE SHIELD

## 2014-12-16 DIAGNOSIS — Z7409 Other reduced mobility: Secondary | ICD-10-CM | POA: Diagnosis not present

## 2014-12-16 DIAGNOSIS — R198 Other specified symptoms and signs involving the digestive system and abdomen: Secondary | ICD-10-CM

## 2014-12-16 DIAGNOSIS — R29898 Other symptoms and signs involving the musculoskeletal system: Secondary | ICD-10-CM

## 2014-12-16 NOTE — Therapy (Signed)
[place], [place], [address] Phone: [phone]   Fax:  [phone]  Physical Therapy Treatment  Patient Details  Name: Autumn Duran MRN: [id number] Date of Birth: Nov 16, 1950 No Data Recorded  Encounter Date: 12/16/2014      PT End of Session - 12/16/14 1140    Visit Number 2   Number of Visits 10   Date for PT Re-Evaluation 01/08/15   Authorization Type BCBS   Authorization Time Period 12/11/14-02/08/15   PT Start Time 1104   PT Stop Time 1146   PT Time Calculation (min) 42 min   Activity Tolerance Patient tolerated treatment well   Behavior During Therapy [facility] for tasks assessed/performed      Past Medical History  Diagnosis Date  . Diverticulosis   . Hemorrhoids   . Hx of adenomatous colonic polyps   . Hiatal hernia   . Hypertension   . Hyperlipidemia   . GERD (gastroesophageal reflux disease)   . Chronic kidney disease     kidney stone s/p stent placement ( removed)  . Complication of anesthesia   . PONV (postoperative nausea and vomiting)   . Bronchitis, chronic ([place])   . Chronic back pain   . Lumbar radicular pain   . Compression fracture of L3 lumbar vertebra (HCC)   . T7 vertebral fracture ([place])   . Smoldering myeloma ([place])   . Sore throat     Past Surgical History  Procedure Laterality Date  . Cholecystectomy    . Esophagogastroduodenoscopy  12/18/2011    Procedure: ESOPHAGOGASTRODUODENOSCOPY (EGD);  Surgeon: Lafayette Dragon, MD;  [facility];  Service: Endoscopy;  Laterality: N/A;  . Colonoscopy  12/18/2011    Procedure: COLONOSCOPY;  Surgeon: Lafayette Dragon, MD;  [facility];  Service: Endoscopy;  Laterality: N/A;  . Ureteral stent placement    . Vertebroplasty N/A 07/14/2013    Procedure: VERTEBROPLASTY WITH LUMBAR THREE BIOPSY;  Surgeon: Charlie Pitter, MD;  Location: [place] [facility];  Service: Neurosurgery;  Laterality: N/A;  VERTEBROPLASTY WITH LUMBAR THREE  BIOPSY  . Back surgery    . Lumbar laminectomy for epidural abscess Left 09/22/2013    Procedure: LUMBAR LAMINECTOMY FOR EPIDURAL ABSCESS LEFT LUMBAR FIVE-SACRAL ONE;  Surgeon: Charlie Pitter, MD;  Location: [place] [facility];  Service: Neurosurgery;  Laterality: Left;  . Bone marrow biopsy Left 02/2014  . Bone marrow aspiration Left 02/2014  . Radiology with anesthesia N/A 08/13/2014    Procedure: RADIOLOGY WITH ANESTHESIA ;  Surgeon: Medication Radiologist, MD;  Location: [place] [facility] OR[facility];  Service: Radiology;  Laterality: N/A;    There were no vitals filed for this visit.  Visit Diagnosis:  Impaired functional mobility and activity tolerance  Weakness of both legs  Abdominal weakness      Subjective Assessment - 12/16/14 1114    Subjective Pain free today, has started wearing pedimeter today, no questions concerning STAR book or pedimeter.   Pertinent History Decompression laminectomy on 09/22/2013 with MRSA epidural abscess; relapse of MRSA infection on 10/01/14.    Currently in Pain? No/denies           [facility] Adult PT Treatment/Exercise - 12/16/14 0001    Exercises   Exercises Lumbar   Lumbar Exercises: Standing   Other Standing Lumbar Exercises 3D hip excursion   Lumbar Exercises: Seated   Sit to Stand 10 reps   Sit to Stand Limitations no HHA   Lumbar Exercises: Supine  Ab Set 10 reps;5 seconds   AB Set Limitations verbal and tactile cueing for TrA activation   Bent Knee Raise 10 reps;5 seconds   Bent Knee Raise Limitations with ab set   Bridge 10 reps   Straight Leg Raise 10 reps   Lumbar Exercises: Sidelying   Clam 10 reps;5 seconds   Hip Abduction 10 reps   Lumbar Exercises: Prone   Straight Leg Raise 10 reps              PT Short Term Goals - 12/11/14 1641    PT SHORT TERM GOAL #1   Title Pt will be independent with HEP.   Time 2   Period Weeks   Status New   PT SHORT TERM GOAL #2   Title Improve hip extensor and abductor strength to 4/5 or greater to  improve gait mechanics.    Time 2   Period Weeks   Status New   PT SHORT TERM GOAL #3   Title Pt will complete five time sit to stand in 13 seconds or less to demonstrate improved functional mobility.    Time 2   Period Weeks   Status New           PT Long Term Goals - 12/11/14 1643    PT LONG TERM GOAL #1   Title Pt will be independent with advanced HEP.    Time 4   Period Weeks   Status New   PT LONG TERM GOAL #2   Title Pt will complete five time sit to stand in 11 seconds or less to demonstrate improved functional mobility.   Time 4   Period Weeks   Status New   PT LONG TERM GOAL #3   Title Improve hip abductor and extensor strength to 4+/5 or greater to improve gait mechanics.    Time 4   Period Weeks   Status New   PT LONG TERM GOAL #4   Title Pt will report that she is ambulating 30 minutes or more per day to improve functional activity tolerance and endurance.    Time 4   Period Weeks   Status New               Plan - 12/16/14 1148    Clinical Impression Statement Reviewed goals, pedometer use and copy of evaluation given Session focus on core strengthening exercises and establishing HEP.  Minimal verbal cueing required for proper TrA contraction and stabilty wtih mat activities. No reports of pain through session.  Pt encouraged to continue walking with pedometer and document steps per day in STAR booklet.  Pt given HEP printout to begin at home.    PT Next Visit Plan Follow up regarding pedometer steps per day, continue core strengthening program and BLE strengthening   PT Home Exercise Plan Given        Problem List Patient Active Problem List   Diagnosis Date Noted  . Nausea with vomiting 10/12/2014  . MRSA infection   . Osteomyelitis of lumbar spine ([place]) 08/13/2014  . Acute low back pain 08/13/2014  . Epidural abscess 08/13/2014  . Hypertension   . Chronic back pain   . Essential hypertension   . Other iron deficiency anemias 04/10/2014   . 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 Right hip pain 12/23/2013  . Epidural abscess, L2-L5 11/04/2013  . Sepsis ([place]) 09/21/2013  . Hypercalcemia 08/20/2013  . Hypokalemia 08/20/2013  . Lumbar compression fracture ([place]) 07/12/2013  . Compression  fracture 07/12/2013  . Benign neoplasm of colon 12/18/2011  . Esophageal reflux 12/18/2011  . Other dysphagia 12/18/2011   Ihor Austin, [place]; [place]  Aldona Lento 12/16/2014, 11:59 AM  [place], [place], [address] Phone: [phone]   Fax:  [phone]  Name: Autumn Duran MRN: [id number] Date of Birth: 1950-05-02

## 2014-12-16 NOTE — Patient Instructions (Signed)
Isometric Abdominal    Lying on back with knees bent, tighten stomach by pressing elbows down. Hold 10 seconds. Repeat 10 times per set. Do 1-2 sets per session. Do 2 sessions per day.  http://orth.exer.us/1087   Copyright  VHI. All rights reserved.      Bridge    Lie back, legs bent. Inhale, pressing hips up. Keeping ribs in, lengthen lower back. Exhale, rolling down along spine from top. Repeat 10 times. Do 1-2 sessions per day.  http://pm.exer.us/55   Copyright  VHI. All rights reserved.   Small Ball Marching Supine    Lie supine with small ball under tailbone, knees flexed. Raise one leg a few inches. Hold briefly, return and raise other leg. Keep hips stationary. Do 2 sets of 10 repetitions.  Copyright  VHI. All rights reserved.   HIP: Flexion / KNEE: Extension, Straight Leg Raise    Raise leg, keeping knee straight. Perform slowly.10 reps per set, 1-2 sets per day, 3-5 days per week   Copyright  VHI. All rights reserved.   Abduction: Side Leg Lift (Eccentric) - Side-Lying    Lie on side. Lift top leg slightly higher than shoulder level. Keep top leg straight with body, toes pointing forward. Slowly lower for 3-5 seconds. 10 reps per set, 1-2 sets per day. http://ecce.exer.us/63   Copyright  VHI. All rights reserved.

## 2014-12-17 ENCOUNTER — Ambulatory Visit (HOSPITAL_COMMUNITY): Payer: BLUE CROSS/BLUE SHIELD | Admitting: Physical Therapy

## 2014-12-17 ENCOUNTER — Telehealth (HOSPITAL_COMMUNITY): Payer: Self-pay | Admitting: Physical Therapy

## 2014-12-17 NOTE — Telephone Encounter (Signed)
She can not come in today

## 2014-12-21 ENCOUNTER — Ambulatory Visit (HOSPITAL_COMMUNITY): Payer: BLUE CROSS/BLUE SHIELD | Admitting: Physical Therapy

## 2014-12-21 DIAGNOSIS — R29898 Other symptoms and signs involving the musculoskeletal system: Secondary | ICD-10-CM

## 2014-12-21 DIAGNOSIS — Z7409 Other reduced mobility: Secondary | ICD-10-CM | POA: Diagnosis not present

## 2014-12-21 DIAGNOSIS — R198 Other specified symptoms and signs involving the digestive system and abdomen: Secondary | ICD-10-CM

## 2014-12-21 NOTE — Therapy (Signed)
[facility] [address] [place], [place], [address] Phone: [phone]   Fax:  [phone]  Physical Therapy Treatment  Patient Details  Name: Autumn Duran MRN: [id number] Date of Birth: 1950-09-24 No Data Recorded  Encounter Date: 12/21/2014      PT End of Session - 12/21/14 1616    Visit Number 3   Number of Visits 10   Date for PT Re-Evaluation 01/08/15   Authorization Type BCBS   Authorization Time Period 12/11/14-02/08/15   PT Start Time 1350   PT Stop Time 1436   PT Time Calculation (min) 46 min   Activity Tolerance Patient tolerated treatment well   Behavior During Therapy [facility] for tasks assessed/performed      Past Medical History  Diagnosis Date  . Diverticulosis   . Hemorrhoids   . Hx of adenomatous colonic polyps   . Hiatal hernia   . Hypertension   . Hyperlipidemia   . GERD (gastroesophageal reflux disease)   . Chronic kidney disease     kidney stone s/p stent placement ( removed)  . Complication of anesthesia   . PONV (postoperative nausea and vomiting)   . Bronchitis, chronic ([place])   . Chronic back pain   . Lumbar radicular pain   . Compression fracture of L3 lumbar vertebra (HCC)   . T7 vertebral fracture ([place])   . Smoldering myeloma ([place])   . Sore throat     Past Surgical History  Procedure Laterality Date  . Cholecystectomy    . Esophagogastroduodenoscopy  12/18/2011    Procedure: ESOPHAGOGASTRODUODENOSCOPY (EGD);  Surgeon: Lafayette Dragon, MD;  [facility];  Service: Endoscopy;  Laterality: N/A;  . Colonoscopy  12/18/2011    Procedure: COLONOSCOPY;  Surgeon: Lafayette Dragon, MD;  [facility];  Service: Endoscopy;  Laterality: N/A;  . Ureteral stent placement    . Vertebroplasty N/A 07/14/2013    Procedure: VERTEBROPLASTY WITH LUMBAR THREE BIOPSY;  Surgeon: Charlie Pitter, MD;  Location: [place] [facility];  Service: Neurosurgery;  Laterality: N/A;  VERTEBROPLASTY WITH LUMBAR THREE  BIOPSY  . Back surgery    . Lumbar laminectomy for epidural abscess Left 09/22/2013    Procedure: LUMBAR LAMINECTOMY FOR EPIDURAL ABSCESS LEFT LUMBAR FIVE-SACRAL ONE;  Surgeon: Charlie Pitter, MD;  Location: [place] [facility];  Service: Neurosurgery;  Laterality: Left;  . Bone marrow biopsy Left 02/2014  . Bone marrow aspiration Left 02/2014  . Radiology with anesthesia N/A 08/13/2014    Procedure: RADIOLOGY WITH ANESTHESIA ;  Surgeon: Medication Radiologist, MD;  Location: [place] [facility];  Service: Radiology;  Laterality: N/A;    There were no vitals filed for this visit.  Visit Diagnosis:  Impaired functional mobility and activity tolerance  Weakness of both legs  Abdominal weakness      Subjective Assessment - 12/21/14 1407    Subjective Pt states she went to a corn maze over the weekend and walked about 30 minutes.  Pt reports compliance with pedometer but has not worked in her booklet yet.                          [facility] Adult PT Treatment/Exercise - 12/21/14 1408    Lumbar Exercises: Stretches   Active Hamstring Stretch 3 reps;30 seconds;Limitations   Active Hamstring Stretch Limitations 12" step   Passive Hamstring Stretch 3 reps;30 seconds   Passive Hamstring Stretch Limitations slant board   Piriformis Stretch 3 reps;30  seconds   Piriformis Stretch Limitations seated   Lumbar Exercises: Seated   Sit to Stand 10 reps   Sit to Stand Limitations no HHA   Lumbar Exercises: Supine   Ab Set 10 reps;5 seconds   AB Set Limitations verbal and tactile cueing for TrA activation   Bent Knee Raise 10 reps;5 seconds   Bent Knee Raise Limitations with ab set   Bridge 10 reps   Straight Leg Raise 10 reps   Lumbar Exercises: Sidelying   Clam 10 reps;5 seconds   Hip Abduction 10 reps                  PT Short Term Goals - 12/21/14 1615    PT SHORT TERM GOAL #1   Title Pt will be independent with HEP.   Time 2   Period Weeks   Status On-going   PT SHORT TERM  GOAL #2   Title Improve hip extensor and abductor strength to 4/5 or greater to improve gait mechanics.    Time 2   Period Weeks   Status On-going   PT SHORT TERM GOAL #3   Title Pt will complete five time sit to stand in 13 seconds or less to demonstrate improved functional mobility.    Time 2   Period Weeks   Status On-going           PT Long Term Goals - 12/21/14 1615    PT LONG TERM GOAL #1   Title Pt will be independent with advanced HEP.    Time 4   Period Weeks   Status On-going   PT LONG TERM GOAL #2   Title Pt will complete five time sit to stand in 11 seconds or less to demonstrate improved functional mobility.   Time 4   Period Weeks   Status On-going   PT LONG TERM GOAL #3   Title Improve hip abductor and extensor strength to 4+/5 or greater to improve gait mechanics.    Time 4   Period Weeks   Status On-going   PT LONG TERM GOAL #4   Title Pt will report that she is ambulating 30 minutes or more per day to improve functional activity tolerance and endurance.    Time 4   Period Weeks   Status On-going               Plan - 12/21/14 1616    Clinical Impression Statement Continued focus on core stregnthening and independence with HEP.  Added hamstring, gastroc and piriformis stretches to POC today with noted tightness in all musculature.  pt  required therapist cues to complete stablilizaiton exericses with correct control and form.  PT reported overall pain reduction and improved mobility at end of session.    PT Next Visit Plan Continue core strengthening program and BLE strengthening   PT Home Exercise Plan Given        Problem List Patient Active Problem List   Diagnosis Date Noted  . Nausea with vomiting 10/12/2014  . MRSA infection   . Osteomyelitis of lumbar spine ([place]) 08/13/2014  . Acute low back pain 08/13/2014  . Epidural abscess 08/13/2014  . Hypertension   . Chronic back pain   . Essential hypertension   . Other iron deficiency  anemias 04/10/2014  . 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 Right hip pain 12/23/2013  . Epidural abscess, L2-L5 11/04/2013  . Sepsis ([place]) 09/21/2013  . Hypercalcemia 08/20/2013  . Hypokalemia 08/20/2013  .  Lumbar compression fracture ([place]) 07/12/2013  . Compression fracture 07/12/2013  . Benign neoplasm of colon 12/18/2011  . Esophageal reflux 12/18/2011  . Other dysphagia 12/18/2011    Teena Irani, PTA/CLT [phone]  12/21/2014, 4:18 PM  [facility] [address] [place], [place], [address] Phone: [phone]   Fax:  [phone]  Name: Autumn Duran MRN: [id number] Date of Birth: 24-Aug-1950

## 2014-12-23 ENCOUNTER — Ambulatory Visit (HOSPITAL_COMMUNITY): Payer: BLUE CROSS/BLUE SHIELD | Admitting: Physical Therapy

## 2014-12-23 DIAGNOSIS — R29898 Other symptoms and signs involving the musculoskeletal system: Secondary | ICD-10-CM

## 2014-12-23 DIAGNOSIS — Z7409 Other reduced mobility: Secondary | ICD-10-CM

## 2014-12-23 DIAGNOSIS — R198 Other specified symptoms and signs involving the digestive system and abdomen: Secondary | ICD-10-CM

## 2014-12-23 NOTE — Therapy (Signed)
[place], [place], [address] Phone: [phone]   Fax:  [phone]  Physical Therapy Treatment  Patient Details  Name: Autumn Duran MRN: [id number] Date of Birth: [birth year] No Data Recorded  Encounter Date: 12/23/2014      PT End of Session - 12/23/14 1150    Visit Number 4   Number of Visits 10   Date for PT Re-Evaluation 01/08/15   Authorization Type BCBS   Authorization Time Period 12/11/14-02/08/15   PT Start Time 1101   PT Stop Time 1142   PT Time Calculation (min) 41 min   Activity Tolerance Patient tolerated treatment well   Behavior During Therapy [facility] for tasks assessed/performed      Past Medical History  Diagnosis Date  . Diverticulosis   . Hemorrhoids   . Hx of adenomatous colonic polyps   . Hiatal hernia   . Hypertension   . Hyperlipidemia   . GERD (gastroesophageal reflux disease)   . Chronic kidney disease     kidney stone s/p stent placement ( removed)  . Complication of anesthesia   . PONV (postoperative nausea and vomiting)   . Bronchitis, chronic ([place])   . Chronic back pain   . Lumbar radicular pain   . Compression fracture of L3 lumbar vertebra (HCC)   . T7 vertebral fracture ([place])   . Smoldering myeloma ([place])   . Sore throat     Past Surgical History  Procedure Laterality Date  . Cholecystectomy    . Esophagogastroduodenoscopy  12/18/2011    Procedure: ESOPHAGOGASTRODUODENOSCOPY (EGD);  Surgeon: Lafayette Dragon, MD;  Location: [facility];  Service: Endoscopy;  Laterality: N/A;  . Colonoscopy  12/18/2011    Procedure: COLONOSCOPY;  Surgeon: Lafayette Dragon, MD;  Location: [facility];  Service: Endoscopy;  Laterality: N/A;  . Ureteral stent placement    . Vertebroplasty N/A 07/14/2013    Procedure: VERTEBROPLASTY WITH LUMBAR THREE BIOPSY;  Surgeon: Charlie Pitter, MD;  Location: [place] [facility];  Service: Neurosurgery;  Laterality: N/A;  VERTEBROPLASTY WITH LUMBAR THREE  BIOPSY  . Back surgery    . Lumbar laminectomy for epidural abscess Left 09/22/2013    Procedure: LUMBAR LAMINECTOMY FOR EPIDURAL ABSCESS LEFT LUMBAR FIVE-SACRAL ONE;  Surgeon: Charlie Pitter, MD;  Location: [place] [facility];  Service: Neurosurgery;  Laterality: Left;  . Bone marrow biopsy Left 02/2014  . Bone marrow aspiration Left 02/2014  . Radiology with anesthesia N/A 08/13/2014    Procedure: RADIOLOGY WITH ANESTHESIA ;  Surgeon: Medication Radiologist, MD;  Location: [place] [facility]UR[facility] ORS;  Service: Radiology;  Laterality: N/A;    There were no vitals filed for this visit.  Visit Diagnosis:  Impaired functional mobility and activity tolerance  Weakness of both legs  Abdominal weakness      Subjective Assessment - 12/23/14 1106    Subjective Pt reports that her back is bothering her a little bit today, rates pain as a 4/10 in her low back    Currently in Pain? Yes   Pain Score 4                   [facility] Adult PT Treatment/Exercise - 12/23/14 0001    Lumbar Exercises: Stretches   Active Hamstring Stretch 3 reps;30 seconds;Limitations   Active Hamstring Stretch Limitations 12" step   Piriformis Stretch 3 reps;30 seconds   Piriformis Stretch Limitations seated   Lumbar Exercises: Standing   Shoulder Extension 15 reps;Theraband  Theraband Level (Shoulder Extension) Level 2 (Red)   Other Standing Lumbar Exercises sidestepping with RTB x 2 RT   Lumbar Exercises: Supine   Ab Set 15 reps;3 seconds   AB Set Limitations verbal and tactile cueing for TrA activation   Bent Knee Raise 10 reps;5 seconds   Bent Knee Raise Limitations with ab set   Bridge 10 reps   Straight Leg Raise 10 reps   Other Supine Lumbar Exercises Bent knee fall out with ab set x 10 bilat   Lumbar Exercises: Sidelying   Hip Abduction 15 reps   Lumbar Exercises: Prone   Straight Leg Raise 10 reps   Lumbar Exercises: Quadruped   Straight Leg Raise 10 reps                  PT Short Term Goals -  12/21/14 1615    PT SHORT TERM GOAL #1   Title Pt will be independent with HEP.   Time 2   Period Weeks   Status On-going   PT SHORT TERM GOAL #2   Title Improve hip extensor and abductor strength to 4/5 or greater to improve gait mechanics.    Time 2   Period Weeks   Status On-going   PT SHORT TERM GOAL #3   Title Pt will complete five time sit to stand in 13 seconds or less to demonstrate improved functional mobility.    Time 2   Period Weeks   Status On-going           PT Long Term Goals - 12/21/14 1615    PT LONG TERM GOAL #1   Title Pt will be independent with advanced HEP.    Time 4   Period Weeks   Status On-going   PT LONG TERM GOAL #2   Title Pt will complete five time sit to stand in 11 seconds or less to demonstrate improved functional mobility.   Time 4   Period Weeks   Status On-going   PT LONG TERM GOAL #3   Title Improve hip abductor and extensor strength to 4+/5 or greater to improve gait mechanics.    Time 4   Period Weeks   Status On-going   PT LONG TERM GOAL #4   Title Pt will report that she is ambulating 30 minutes or more per day to improve functional activity tolerance and endurance.    Time 4   Period Weeks   Status On-going               Plan - 12/23/14 1150    Clinical Impression Statement Treatment session focused on core and hip strengthening in order to decrease LBP. Bent leg fall out with TrA contraction was added today to improve core stability, pt required verbal and tactile cueing to ensure proper form. Quadruped hip extension and sidestepping with RTB were completed to increase hip strength, pt required tactile cueing with quadruped hip extension to avoid rotation of hips.    PT Next Visit Plan Continue core strengthening program and BLE strengthening        Problem List Patient Active Problem List   Diagnosis Date Noted  . Nausea with vomiting 10/12/2014  . MRSA infection   . Osteomyelitis of lumbar spine ([place])  08/13/2014  . Acute low back pain 08/13/2014  . Epidural abscess 08/13/2014  . Hypertension   . Chronic back pain   . Essential hypertension   . Other iron deficiency anemias 04/10/2014  . 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 Right hip pain 12/23/2013  . Epidural abscess, L2-L5 11/04/2013  . Sepsis ([place]) 09/21/2013  . Hypercalcemia 08/20/2013  . Hypokalemia 08/20/2013  . Lumbar compression fracture ([place]) 07/12/2013  . Compression fracture 07/12/2013  . Benign neoplasm of colon 12/18/2011  . Esophageal reflux 12/18/2011  . Other dysphagia 12/18/2011    Hilma Favors, PT, DPT [phone] 12/23/2014, 12:01 PM  [place] [address] [place], [place], [address] Phone: [phone]   Fax:  [phone]  Name: SANIAH SCHROETER MRN: [id number] Date of Birth: Apr 25, 1950

## 2014-12-25 ENCOUNTER — Other Ambulatory Visit (HOSPITAL_COMMUNITY): Payer: Self-pay

## 2014-12-30 ENCOUNTER — Telehealth (HOSPITAL_COMMUNITY): Payer: Self-pay | Admitting: Hematology & Oncology

## 2014-12-30 ENCOUNTER — Ambulatory Visit (HOSPITAL_COMMUNITY): Payer: BLUE CROSS/BLUE SHIELD | Attending: Hematology & Oncology | Admitting: Physical Therapy

## 2014-12-30 DIAGNOSIS — R198 Other specified symptoms and signs involving the digestive system and abdomen: Secondary | ICD-10-CM

## 2014-12-30 DIAGNOSIS — R29898 Other symptoms and signs involving the musculoskeletal system: Secondary | ICD-10-CM | POA: Insufficient documentation

## 2014-12-30 DIAGNOSIS — Z7409 Other reduced mobility: Secondary | ICD-10-CM

## 2014-12-30 NOTE — Therapy (Signed)
[place], [place], [address] Phone: [phone]   Fax:  [phone]  Physical Therapy Treatment  Patient Details  Name: Autumn Duran MRN: [id number] Date of Birth: 09-09-1950 No Data Recorded  Encounter Date: 12/30/2014      PT End of Session - 12/30/14 1155    Visit Number 5   Number of Visits 10   Date for PT Re-Evaluation 01/08/15   Authorization Type BCBS   Authorization Time Period 12/11/14-02/08/15   PT Start Time 1105   PT Stop Time 1145   PT Time Calculation (min) 40 min   Activity Tolerance Patient tolerated treatment well   Behavior During Therapy [facility] for tasks assessed/performed      Past Medical History  Diagnosis Date  . Diverticulosis   . Hemorrhoids   . Hx of adenomatous colonic polyps   . Hiatal hernia   . Hypertension   . Hyperlipidemia   . GERD (gastroesophageal reflux disease)   . Chronic kidney disease     kidney stone s/p stent placement ( removed)  . Complication of anesthesia   . PONV (postoperative nausea and vomiting)   . Bronchitis, chronic ([place])   . Chronic back pain   . Lumbar radicular pain   . Compression fracture of L3 lumbar vertebra (HCC)   . T7 vertebral fracture ([place])   . Smoldering myeloma ([place])   . Sore throat     Past Surgical History  Procedure Laterality Date  . Cholecystectomy    . Esophagogastroduodenoscopy  12/18/2011    Procedure: ESOPHAGOGASTRODUODENOSCOPY (EGD);  Surgeon: Lafayette Dragon, MD;  Location: [facility];  Service: Endoscopy;  Laterality: N/A;  . Colonoscopy  12/18/2011    Procedure: COLONOSCOPY;  Surgeon: Lafayette Dragon, MD;  [facility];  Service: Endoscopy;  Laterality: N/A;  . Ureteral stent placement    . Vertebroplasty N/A 07/14/2013    Procedure: VERTEBROPLASTY WITH LUMBAR THREE BIOPSY;  Surgeon: Charlie Pitter, MD;  Location: [place] NEURO ORS;  Service: Neurosurgery;  Laterality: N/A;  VERTEBROPLASTY WITH LUMBAR THREE  BIOPSY  . Back surgery    . Lumbar laminectomy for epidural abscess Left 09/22/2013    Procedure: LUMBAR LAMINECTOMY FOR EPIDURAL ABSCESS LEFT LUMBAR FIVE-SACRAL ONE;  Surgeon: Charlie Pitter, MD;  Location: [place] NEURO ORS;  Service: Neurosurgery;  Laterality: Left;  . Bone marrow biopsy Left 02/2014  . Bone marrow aspiration Left 02/2014  . Radiology with anesthesia N/A 08/13/2014    Procedure: RADIOLOGY WITH ANESTHESIA ;  Surgeon: Medication Radiologist, MD;  Location: [place] NEURO ORS;  Service: Radiology;  Laterality: N/A;    There were no vitals filed for this visit.  Visit Diagnosis:  Impaired functional mobility and activity tolerance  Weakness of both legs  Abdominal weakness      Subjective Assessment - 12/30/14 1158    Subjective Pt reports she is getting better without pain today.    Currently in Pain? No/denies                         [facility] Adult PT Treatment/Exercise - 12/30/14 0001    Lumbar Exercises: Stretches   Active Hamstring Stretch 3 reps;30 seconds;Limitations   Active Hamstring Stretch Limitations 12" step   Passive Hamstring Stretch 3 reps;30 seconds   Passive Hamstring Stretch Limitations slant board   Piriformis Stretch 3 reps;30 seconds   Piriformis Stretch Limitations seated   Lumbar Exercises: Standing   Row  10 reps;Theraband   Theraband Level (Row) Level 2 (Red)   Shoulder Extension 10 reps;Theraband   Theraband Level (Shoulder Extension) Level 2 (Red)   Lumbar Exercises: Seated   Sit to Stand 10 reps   Sit to Stand Limitations no HHA   Lumbar Exercises: Supine   Ab Set 15 reps;3 seconds   Bent Knee Raise 15 reps   Bent Knee Raise Limitations with ab set   Bridge 15 reps   Straight Leg Raise 15 reps   Lumbar Exercises: Sidelying   Clam 15 reps   Hip Abduction 15 reps   Lumbar Exercises: Prone   Straight Leg Raise 15 reps                  PT Short Term Goals - 12/21/14 1615    PT SHORT TERM GOAL #1   Title Pt will be  independent with HEP.   Time 2   Period Weeks   Status On-going   PT SHORT TERM GOAL #2   Title Improve hip extensor and abductor strength to 4/5 or greater to improve gait mechanics.    Time 2   Period Weeks   Status On-going   PT SHORT TERM GOAL #3   Title Pt will complete five time sit to stand in 13 seconds or less to demonstrate improved functional mobility.    Time 2   Period Weeks   Status On-going           PT Long Term Goals - 12/21/14 1615    PT LONG TERM GOAL #1   Title Pt will be independent with advanced HEP.    Time 4   Period Weeks   Status On-going   PT LONG TERM GOAL #2   Title Pt will complete five time sit to stand in 11 seconds or less to demonstrate improved functional mobility.   Time 4   Period Weeks   Status On-going   PT LONG TERM GOAL #3   Title Improve hip abductor and extensor strength to 4+/5 or greater to improve gait mechanics.    Time 4   Period Weeks   Status On-going   PT LONG TERM GOAL #4   Title Pt will report that she is ambulating 30 minutes or more per day to improve functional activity tolerance and endurance.    Time 4   Period Weeks   Status On-going               Plan - 12/30/14 1155    Clinical Impression Statement Continued focus on increasing core and LE strength.  Pt is overall progressing well with decreasing pain and improving strength.  Pt continues to require cues to complete 50% of therex due to incorrect form and speed fo therex.  Difficulty keeping onto side with sidelying hip abduction.     PT Next Visit Plan Continue core strengthening program and BLE strengthening        Problem List Patient Active Problem List   Diagnosis Date Noted  . Nausea with vomiting 10/12/2014  . MRSA infection   . Osteomyelitis of lumbar spine ([place]) 08/13/2014  . Acute low back pain 08/13/2014  . Epidural abscess 08/13/2014  . Hypertension   . Chronic back pain   . Essential hypertension   . Other iron deficiency  anemias 04/10/2014  . 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 Right hip pain 12/23/2013  . Epidural abscess, L2-L5 11/04/2013  . Sepsis ([place]) 09/21/2013  . Hypercalcemia 08/20/2013  .  Hypokalemia 08/20/2013  . Lumbar compression fracture ([place]) 07/12/2013  . Compression fracture 07/12/2013  . Benign neoplasm of colon 12/18/2011  . Esophageal reflux 12/18/2011  . Other dysphagia 12/18/2011    Teena Irani, PTA/CLT [phone] 12/30/2014, 11:58 AM  [place] [address] [place], [place], [address] Phone: [phone]   Fax:  [phone]  Name: CAMILA MAITA MRN: [id number] Date of Birth: 11/09/1950

## 2014-12-30 NOTE — Telephone Encounter (Signed)
PC TO [employer] SPOKE WITH SARAH B. ?'D IF CPT [phone] [place] NEEDED AUTH. PER SARAH IT DOES NOT

## 2014-12-31 ENCOUNTER — Ambulatory Visit (HOSPITAL_COMMUNITY): Payer: BLUE CROSS/BLUE SHIELD | Admitting: Physical Therapy

## 2014-12-31 DIAGNOSIS — R198 Other specified symptoms and signs involving the digestive system and abdomen: Secondary | ICD-10-CM

## 2014-12-31 DIAGNOSIS — R29898 Other symptoms and signs involving the musculoskeletal system: Secondary | ICD-10-CM

## 2014-12-31 DIAGNOSIS — Z7409 Other reduced mobility: Secondary | ICD-10-CM | POA: Diagnosis not present

## 2014-12-31 NOTE — Therapy (Signed)
[place], [place], [address] Phone: [phone]   Fax:  [phone]  Physical Therapy Treatment  Patient Details  Name: Autumn Duran MRN: [id number] Date of Birth: [birth year] No Data Recorded  Encounter Date: 12/31/2014      PT End of Session - 12/31/14 1148    Visit Number 6   Number of Visits 10   Date for PT Re-Evaluation 01/08/15   Authorization Type BCBS   Authorization Time Period 12/11/14-02/08/15   PT Start Time 1100   PT Stop Time 1141   PT Time Calculation (min) 41 min   Equipment Utilized During Treatment Gait belt   Activity Tolerance Patient tolerated treatment well   Behavior During Therapy [facility] for tasks assessed/performed      Past Medical History  Diagnosis Date  . Diverticulosis   . Hemorrhoids   . Hx of adenomatous colonic polyps   . Hiatal hernia   . Hypertension   . Hyperlipidemia   . GERD (gastroesophageal reflux disease)   . Chronic kidney disease     kidney stone s/p stent placement ( removed)  . Complication of anesthesia   . PONV (postoperative nausea and vomiting)   . Bronchitis, chronic ([place])   . Chronic back pain   . Lumbar radicular pain   . Compression fracture of L3 lumbar vertebra (HCC)   . T7 vertebral fracture ([place])   . Smoldering myeloma ([place])   . Sore throat     Past Surgical History  Procedure Laterality Date  . Cholecystectomy    . Esophagogastroduodenoscopy  12/18/2011    Procedure: ESOPHAGOGASTRODUODENOSCOPY (EGD);  Surgeon: Lafayette Dragon, MD;  [facility];  Service: Endoscopy;  Laterality: N/A;  . Colonoscopy  12/18/2011    Procedure: COLONOSCOPY;  Surgeon: Lafayette Dragon, MD;  [facility];  Service: Endoscopy;  Laterality: N/A;  . Ureteral stent placement    . Vertebroplasty N/A 07/14/2013    Procedure: VERTEBROPLASTY WITH LUMBAR THREE BIOPSY;  Surgeon: Charlie Pitter, MD;  Location: [place] NEURO ORS;  Service: Neurosurgery;   Laterality: N/A;  VERTEBROPLASTY WITH LUMBAR THREE BIOPSY  . Back surgery    . Lumbar laminectomy for epidural abscess Left 09/22/2013    Procedure: LUMBAR LAMINECTOMY FOR EPIDURAL ABSCESS LEFT LUMBAR FIVE-SACRAL ONE;  Surgeon: Charlie Pitter, MD;  Location: [place] [facility]S;  Service: Neurosurgery;  Laterality: Left;  . Bone marrow biopsy Left 02/2014  . Bone marrow aspiration Left 02/2014  . Radiology with anesthesia N/A 08/13/2014    Procedure: RADIOLOGY WITH ANESTHESIA ;  Surgeon: Medication Radiologist, MD;  Location: [place] NEURO ORS;  Service: Radiology;  Laterality: N/A;    There were no vitals filed for this visit.  Visit Diagnosis:  Impaired functional mobility and activity tolerance  Weakness of both legs  Abdominal weakness      Subjective Assessment - 12/31/14 1103    Subjective Pt reports that she has some discomfort in her low back today.    Currently in Pain? Yes   Pain Score 3    Pain Location Back   Pain Orientation Lower                         [facility] Adult PT Treatment/Exercise - 12/31/14 0001    Lumbar Exercises: Stretches   Active Hamstring Stretch 3 reps;30 seconds;Limitations   Active Hamstring Stretch Limitations 12" step   Piriformis Stretch 3 reps;30 seconds   Piriformis  Stretch Limitations seated   Lumbar Exercises: Standing   Functional Squats 10 reps   Row 15 reps   Theraband Level (Row) Level 2 (Red)   Shoulder Extension 15 reps   Theraband Level (Shoulder Extension) Level 2 (Red)   Other Standing Lumbar Exercises oblique punches with RTB x 10 bilat   Other Standing Lumbar Exercises sidestepping with RTB x 2 RT, tandem gait x 2 RT   Lumbar Exercises: Seated   Long Arc Quad on [place] 10 reps   Hip Flexion on Ball 10 reps   Lumbar Exercises: Supine   Ab Set 15 reps;3 seconds   Bent Knee Raise 15 reps   Bent Knee Raise Limitations with ab set   Bridge 15 reps   Straight Leg Raise 10 reps   Lumbar Exercises: Sidelying   Hip Abduction 10  reps   Lumbar Exercises: Prone   Straight Leg Raise 15 reps   Lumbar Exercises: Quadruped   Straight Leg Raise 10 reps                  PT Short Term Goals - 12/21/14 1615    PT SHORT TERM GOAL #1   Title Pt will be independent with HEP.   Time 2   Period Weeks   Status On-going   PT SHORT TERM GOAL #2   Title Improve hip extensor and abductor strength to 4/5 or greater to improve gait mechanics.    Time 2   Period Weeks   Status On-going   PT SHORT TERM GOAL #3   Title Pt will complete five time sit to stand in 13 seconds or less to demonstrate improved functional mobility.    Time 2   Period Weeks   Status On-going           PT Long Term Goals - 12/21/14 1615    PT LONG TERM GOAL #1   Title Pt will be independent with advanced HEP.    Time 4   Period Weeks   Status On-going   PT LONG TERM GOAL #2   Title Pt will complete five time sit to stand in 11 seconds or less to demonstrate improved functional mobility.   Time 4   Period Weeks   Status On-going   PT LONG TERM GOAL #3   Title Improve hip abductor and extensor strength to 4+/5 or greater to improve gait mechanics.    Time 4   Period Weeks   Status On-going   PT LONG TERM GOAL #4   Title Pt will report that she is ambulating 30 minutes or more per day to improve functional activity tolerance and endurance.    Time 4   Period Weeks   Status On-going               Plan - 12/31/14 1150    Clinical Impression Statement Continued with core and hip strengthening today, added quadruped hip extension, seated hip flexion and LAQ on swiss ball, and oblique punches to improve abdominal and hip strength. Pt required CGA for safety with seated exercises on swiss ball, was able to complete all therex without any c/o increasd pain today.    PT Next Visit Plan Continue core strengthening program and BLE strengthening, progress to green theraband with strengthening exercises        Problem  List Patient Active Problem List   Diagnosis Date Noted  . Nausea with vomiting 10/12/2014  . MRSA infection   . Osteomyelitis of lumbar  spine ([place]) 08/13/2014  . Acute low back pain 08/13/2014  . Epidural abscess 08/13/2014  . Hypertension   . Chronic back pain   . Essential hypertension   . Other iron deficiency anemias 04/10/2014  . 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 Right hip pain 12/23/2013  . Epidural abscess, L2-L5 11/04/2013  . Sepsis ([place]) 09/21/2013  . Hypercalcemia 08/20/2013  . Hypokalemia 08/20/2013  . Lumbar compression fracture ([place]) 07/12/2013  . Compression fracture 07/12/2013  . Benign neoplasm of colon 12/18/2011  . Esophageal reflux 12/18/2011  . Other dysphagia 12/18/2011    Hilma Favors, PT, DPT [phone] 12/31/2014, 12:03 PM  [place] [address] [place], [place], [address] Phone: [phone]   Fax:  [phone]  Name: Autumn Duran MRN: [id number] Date of Birth: 1951/02/20

## 2015-01-04 ENCOUNTER — Ambulatory Visit (HOSPITAL_COMMUNITY): Payer: BLUE CROSS/BLUE SHIELD | Admitting: Physical Therapy

## 2015-01-04 DIAGNOSIS — Z7409 Other reduced mobility: Secondary | ICD-10-CM

## 2015-01-04 DIAGNOSIS — R29898 Other symptoms and signs involving the musculoskeletal system: Secondary | ICD-10-CM

## 2015-01-04 DIAGNOSIS — R198 Other specified symptoms and signs involving the digestive system and abdomen: Secondary | ICD-10-CM

## 2015-01-04 NOTE — Therapy (Signed)
[place], [place], [address] Phone: [phone]   Fax:  [phone]  Physical Therapy Treatment  Patient Details  Name: Autumn Duran MRN: [id number] Date of Birth: 10-Mar-1950 No Data Recorded  Encounter Date: 01/04/2015      PT End of Session - 01/04/15 1201    Visit Number 7   Number of Visits 10   Date for PT Re-Evaluation 01/08/15   Authorization Type BCBS   Authorization Time Period 12/11/14-02/08/15   PT Start Time 1108   PT Stop Time 1146   PT Time Calculation (min) 38 min   Equipment Utilized During Treatment Gait belt   Activity Tolerance Patient tolerated treatment well   Behavior During Therapy [facility] for tasks assessed/performed      Past Medical History  Diagnosis Date  . Diverticulosis   . Hemorrhoids   . Hx of adenomatous colonic polyps   . Hiatal hernia   . Hypertension   . Hyperlipidemia   . GERD (gastroesophageal reflux disease)   . Chronic kidney disease     kidney stone s/p stent placement ( removed)  . Complication of anesthesia   . PONV (postoperative nausea and vomiting)   . Bronchitis, chronic ([place])   . Chronic back pain   . Lumbar radicular pain   . Compression fracture of L3 lumbar vertebra (HCC)   . T7 vertebral fracture ([place])   . Smoldering myeloma ([place])   . Sore throat     Past Surgical History  Procedure Laterality Date  . Cholecystectomy    . Esophagogastroduodenoscopy  12/18/2011    Procedure: ESOPHAGOGASTRODUODENOSCOPY (EGD);  Surgeon: Lafayette Dragon, MD;  Location: [facility];  Service: Endoscopy;  Laterality: N/A;  . Colonoscopy  12/18/2011    Procedure: COLONOSCOPY;  Surgeon: Lafayette Dragon, MD;  Location: [facility];  Service: Endoscopy;  Laterality: N/A;  . Ureteral stent placement    . Vertebroplasty N/A 07/14/2013    Procedure: VERTEBROPLASTY WITH LUMBAR THREE BIOPSY;  Surgeon: Charlie Pitter, MD;  Location: [place] [facility];  Service: Neurosurgery;   Laterality: N/A;  VERTEBROPLASTY WITH LUMBAR THREE BIOPSY  . Back surgery    . Lumbar laminectomy for epidural abscess Left 09/22/2013    Procedure: LUMBAR LAMINECTOMY FOR EPIDURAL ABSCESS LEFT LUMBAR FIVE-SACRAL ONE;  Surgeon: Charlie Pitter, MD;  Location: [place] [facility];  Service: Neurosurgery;  Laterality: Left;  . Bone marrow biopsy Left 02/2014  . Bone marrow aspiration Left 02/2014  . Radiology with anesthesia N/A 08/13/2014    Procedure: RADIOLOGY WITH ANESTHESIA ;  Surgeon: Medication Radiologist, MD;  Location: [place] [facility]UR[facility] ORS;  Service: Radiology;  Laterality: N/A;    There were no vitals filed for this visit.  Visit Diagnosis:  Impaired functional mobility and activity tolerance  Weakness of both legs  Abdominal weakness                       [facility] Adult PT Treatment/Exercise - 01/04/15 0001    Lumbar Exercises: Stretches   Active Hamstring Stretch 3 reps;30 seconds;Limitations   Active Hamstring Stretch Limitations 12" step   Piriformis Stretch 3 reps;30 seconds   Piriformis Stretch Limitations seated   Lumbar Exercises: Standing   Functional Squats 15 reps   Forward Lunge 10 reps   Forward Lunge Limitations 6" step   Side Lunge 10 reps;Limitations   Side Lunge Limitations 6" step   Row 15 reps   Theraband Level (Row) Level  3 (Green)   Shoulder Extension 15 reps   Theraband Level (Shoulder Extension) Level 3 (Green)   Other Standing Lumbar Exercises oblique punches GTB x 15 bilat   Other Standing Lumbar Exercises sidestepping with RTB x 2 RT, tandem gait x 2 RT   Lumbar Exercises: Seated   Long Arc Quad on [place] 10 reps   Hip Flexion on [place] 10 reps   Lumbar Exercises: Quadruped   Straight Leg Raise 10 reps   Opposite Arm/Leg Raise 10 reps                  PT Short Term Goals - 12/21/14 1615    PT SHORT TERM GOAL #1   Title Pt will be independent with HEP.   Time 2   Period Weeks   Status On-going   PT SHORT TERM GOAL #2   Title Improve  hip extensor and abductor strength to 4/5 or greater to improve gait mechanics.    Time 2   Period Weeks   Status On-going   PT SHORT TERM GOAL #3   Title Pt will complete five time sit to stand in 13 seconds or less to demonstrate improved functional mobility.    Time 2   Period Weeks   Status On-going           PT Long Term Goals - 12/21/14 1615    PT LONG TERM GOAL #1   Title Pt will be independent with advanced HEP.    Time 4   Period Weeks   Status On-going   PT LONG TERM GOAL #2   Title Pt will complete five time sit to stand in 11 seconds or less to demonstrate improved functional mobility.   Time 4   Period Weeks   Status On-going   PT LONG TERM GOAL #3   Title Improve hip abductor and extensor strength to 4+/5 or greater to improve gait mechanics.    Time 4   Period Weeks   Status On-going   PT LONG TERM GOAL #4   Title Pt will report that she is ambulating 30 minutes or more per day to improve functional activity tolerance and endurance.    Time 4   Period Weeks   Status On-going               Plan - 01/04/15 1203    Clinical Impression Statement Pt 8 minutes late to treatment session today. Core and hip strengthening was progressed today, with the addition of forward and side lunges, quadruped arm/leg extension, and progression of theraband exercises to green tband. Pt required verbal and tactile cueing to complete forward and side lunges properly and to avoid excessive knee flexion. Pt was able to complete all added exercises without c/o increased pain today.    PT Next Visit Plan Continue with standing and seated core strengthening, add fire hydrant next session.         Problem List Patient Active Problem List   Diagnosis Date Noted  . Nausea with vomiting 10/12/2014  . MRSA infection   . Osteomyelitis of lumbar spine ([place]) 08/13/2014  . Acute low back pain 08/13/2014  . Epidural abscess 08/13/2014  . Hypertension   . Chronic back pain   .  Essential hypertension   . Other iron deficiency anemias 04/10/2014  . 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 Right hip pain 12/23/2013  . Epidural abscess, L2-L5 11/04/2013  . Sepsis ([place]) 09/21/2013  . Hypercalcemia 08/20/2013  . Hypokalemia  08/20/2013  . Lumbar compression fracture ([place]) 07/12/2013  . Compression fracture 07/12/2013  . Benign neoplasm of colon 12/18/2011  . Esophageal reflux 12/18/2011  . Other dysphagia 12/18/2011    Hilma Favors, PT, DPT [phone] 01/04/2015, 12:09 PM  [place] [address] [place], [place], [address] Phone: [phone]   Fax:  [phone]  Name: JULIYAH MERGEN MRN: [id number] Date of Birth: 1950/05/01

## 2015-01-05 ENCOUNTER — Ambulatory Visit (HOSPITAL_COMMUNITY): Payer: BLUE CROSS/BLUE SHIELD | Admitting: Physical Therapy

## 2015-01-05 ENCOUNTER — Ambulatory Visit (HOSPITAL_COMMUNITY): Payer: BLUE CROSS/BLUE SHIELD

## 2015-01-13 ENCOUNTER — Ambulatory Visit (HOSPITAL_COMMUNITY): Payer: BLUE CROSS/BLUE SHIELD | Admitting: Physical Therapy

## 2015-01-13 DIAGNOSIS — R198 Other specified symptoms and signs involving the digestive system and abdomen: Secondary | ICD-10-CM

## 2015-01-13 DIAGNOSIS — R29898 Other symptoms and signs involving the musculoskeletal system: Secondary | ICD-10-CM

## 2015-01-13 DIAGNOSIS — Z7409 Other reduced mobility: Secondary | ICD-10-CM | POA: Diagnosis not present

## 2015-01-13 NOTE — Therapy (Signed)
[place], [place], [address] Phone: [phone]   Fax:  [phone]  Physical Therapy Treatment  Patient Details  Name: Autumn Duran MRN: [id number] Date of Birth: 01-17-1951 No Data Recorded  Encounter Date: 01/13/2015      PT End of Session - 01/13/15 1254    Visit Number 8   Number of Visits 10   Date for PT Re-Evaluation 01/20/15   Authorization Type BCBS   Authorization Time Period 12/11/14-02/08/15   PT Start Time 1102   PT Stop Time 1140   PT Time Calculation (min) 38 min   Activity Tolerance Patient tolerated treatment well   Behavior During Therapy [facility] for tasks assessed/performed      Past Medical History  Diagnosis Date  . Diverticulosis   . Hemorrhoids   . Hx of adenomatous colonic polyps   . Hiatal hernia   . Hypertension   . Hyperlipidemia   . GERD (gastroesophageal reflux disease)   . Chronic kidney disease     kidney stone s/p stent placement ( removed)  . Complication of anesthesia   . PONV (postoperative nausea and vomiting)   . Bronchitis, chronic ([place])   . Chronic back pain   . Lumbar radicular pain   . Compression fracture of L3 lumbar vertebra (HCC)   . T7 vertebral fracture ([place])   . Smoldering myeloma ([place])   . Sore throat     Past Surgical History  Procedure Laterality Date  . Cholecystectomy    . Esophagogastroduodenoscopy  12/18/2011    Procedure: ESOPHAGOGASTRODUODENOSCOPY (EGD);  Surgeon: Lafayette Dragon, MD;  Location: [facility];  Service: Endoscopy;  Laterality: N/A;  . Colonoscopy  12/18/2011    Procedure: COLONOSCOPY;  Surgeon: Lafayette Dragon, MD;  [facility];  Service: Endoscopy;  Laterality: N/A;  . Ureteral stent placement    . Vertebroplasty N/A 07/14/2013    Procedure: VERTEBROPLASTY WITH LUMBAR THREE BIOPSY;  Surgeon: Charlie Pitter, MD;  Location: [place] [facility]S;  Service: Neurosurgery;  Laterality: N/A;  VERTEBROPLASTY WITH LUMBAR THREE  BIOPSY  . Back surgery    . Lumbar laminectomy for epidural abscess Left 09/22/2013    Procedure: LUMBAR LAMINECTOMY FOR EPIDURAL ABSCESS LEFT LUMBAR FIVE-SACRAL ONE;  Surgeon: Charlie Pitter, MD;  Location: [place] [facility];  Service: Neurosurgery;  Laterality: Left;  . Bone marrow biopsy Left 02/2014  . Bone marrow aspiration Left 02/2014  . Radiology with anesthesia N/A 08/13/2014    Procedure: RADIOLOGY WITH ANESTHESIA ;  Surgeon: Medication Radiologist, MD;  Location: [place] [facility]UR[facility];  Service: Radiology;  Laterality: N/A;    There were no vitals filed for this visit.  Visit Diagnosis:  Impaired functional mobility and activity tolerance  Weakness of both legs  Abdominal weakness      Subjective Assessment - 01/13/15 1107    Subjective Pt reports that she is having a good day today, denies having any pain.    Currently in Pain? No/denies   Pain Score 0-No pain                 [facility] Adult PT Treatment/Exercise - 01/13/15 0001    Lumbar Exercises: Stretches   Active Hamstring Stretch 3 reps;30 seconds;Limitations   Active Hamstring Stretch Limitations 12" step   Piriformis Stretch 3 reps;30 seconds   Piriformis Stretch Limitations seated   Lumbar Exercises: Standing   Functional Squats 15 reps   Forward Lunge 10 reps   Side Lunge  10 reps   Row 15 reps   Theraband Level (Row) Level 3 (Green)   Shoulder Extension 15 reps   Theraband Level (Shoulder Extension) Level 3 (Green)   Other Standing Lumbar Exercises oblique punches GTB x 15 bilat, standing hip extension x 15 bilat   Other Standing Lumbar Exercises sidestepping with GTB x 2 RT, monster walk with GTB x 2 RT, tandem gait x 2RT   Lumbar Exercises: Seated   Long Arc Quad on [place] 10 reps   Hip Flexion on [place] 10 reps   Lumbar Exercises: Quadruped   Straight Leg Raise 10 reps   Opposite Arm/Leg Raise 10 reps                  PT Short Term Goals - 12/21/14 1615    PT SHORT TERM GOAL #1   Title Pt will be  independent with HEP.   Time 2   Period Weeks   Status On-going   PT SHORT TERM GOAL #2   Title Improve hip extensor and abductor strength to 4/5 or greater to improve gait mechanics.    Time 2   Period Weeks   Status On-going   PT SHORT TERM GOAL #3   Title Pt will complete five time sit to stand in 13 seconds or less to demonstrate improved functional mobility.    Time 2   Period Weeks   Status On-going           PT Long Term Goals - 12/21/14 1615    PT LONG TERM GOAL #1   Title Pt will be independent with advanced HEP.    Time 4   Period Weeks   Status On-going   PT LONG TERM GOAL #2   Title Pt will complete five time sit to stand in 11 seconds or less to demonstrate improved functional mobility.   Time 4   Period Weeks   Status On-going   PT LONG TERM GOAL #3   Title Improve hip abductor and extensor strength to 4+/5 or greater to improve gait mechanics.    Time 4   Period Weeks   Status On-going   PT LONG TERM GOAL #4   Title Pt will report that she is ambulating 30 minutes or more per day to improve functional activity tolerance and endurance.    Time 4   Period Weeks   Status On-going               Plan - 01/13/15 1410    Clinical Impression Statement Continued with core and hip strengthening today. Pt required CGA with seated exercises on swiss ball for safety, with verbal cueing given for proper form. Sidestepping was progressed with GTB, monster walks with GTB added to improve glut med strength and increase pelvic stabilization. Pt denied any pain post treatment.    PT Next Visit Plan Continue with standing and seated core strengthening, add fire hydrant next session.         Problem List Patient Active Problem List   Diagnosis Date Noted  . Nausea with vomiting 10/12/2014  . MRSA infection   . Osteomyelitis of lumbar spine ([place]) 08/13/2014  . Acute low back pain 08/13/2014  . Epidural abscess 08/13/2014  . Hypertension   . Chronic back  pain   . Essential hypertension   . Other iron deficiency anemias 04/10/2014  . 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 Right hip pain 12/23/2013  . Epidural abscess, L2-L5 11/04/2013  . Sepsis (  [place]) 09/21/2013  . Hypercalcemia 08/20/2013  . Hypokalemia 08/20/2013  . Lumbar compression fracture ([place]) 07/12/2013  . Compression fracture 07/12/2013  . Benign neoplasm of colon 12/18/2011  . Esophageal reflux 12/18/2011  . Other dysphagia 12/18/2011    Hilma Favors, PT, DPT [phone] 01/13/2015, 2:12 PM  [place], [place], [address] Phone: [phone]   Fax:  [phone]  Name: CHRISTALYN GOERTZ MRN: [id number] Date of Birth: 1950-07-26

## 2015-01-14 ENCOUNTER — Encounter: Payer: Self-pay | Admitting: Internal Medicine

## 2015-01-14 ENCOUNTER — Ambulatory Visit (HOSPITAL_COMMUNITY): Payer: BLUE CROSS/BLUE SHIELD | Admitting: Physical Therapy

## 2015-01-14 DIAGNOSIS — Z7409 Other reduced mobility: Secondary | ICD-10-CM

## 2015-01-14 DIAGNOSIS — R198 Other specified symptoms and signs involving the digestive system and abdomen: Secondary | ICD-10-CM

## 2015-01-14 DIAGNOSIS — R29898 Other symptoms and signs involving the musculoskeletal system: Secondary | ICD-10-CM

## 2015-01-14 NOTE — Therapy (Signed)
[place], [place], [address] Phone: [phone]   Fax:  [phone]  Physical Therapy Treatment  Patient Details  Name: Autumn Duran MRN: [id number] Date of Birth: November 30, 1950 No Data Recorded  Encounter Date: 01/14/2015      PT End of Session - 01/14/15 1347    Visit Number 9  progress note sent on visit 9    Number of Visits 17   Date for PT Re-Evaluation 02/03/15   Authorization Type BCBS   Authorization Time Period 12/11/14-02/08/15   PT Start Time 1302   PT Stop Time 1342   PT Time Calculation (min) 40 min   Activity Tolerance Patient tolerated treatment well      Past Medical History  Diagnosis Date  . Diverticulosis   . Hemorrhoids   . Hx of adenomatous colonic polyps   . Hiatal hernia   . Hypertension   . Hyperlipidemia   . GERD (gastroesophageal reflux disease)   . Chronic kidney disease     kidney stone s/p stent placement ( removed)  . Complication of anesthesia   . PONV (postoperative nausea and vomiting)   . Bronchitis, chronic ([place])   . Chronic back pain   . Lumbar radicular pain   . Compression fracture of L3 lumbar vertebra (HCC)   . T7 vertebral fracture ([place])   . Smoldering myeloma ([place])   . Sore throat     Past Surgical History  Procedure Laterality Date  . Cholecystectomy    . Esophagogastroduodenoscopy  12/18/2011    Procedure: ESOPHAGOGASTRODUODENOSCOPY (EGD);  Surgeon: Lafayette Dragon, MD;  Location: [facility];  Service: Endoscopy;  Laterality: N/A;  . Colonoscopy  12/18/2011    Procedure: COLONOSCOPY;  Surgeon: Lafayette Dragon, MD;  [facility];  Service: Endoscopy;  Laterality: N/A;  . Ureteral stent placement    . Vertebroplasty N/A 07/14/2013    Procedure: VERTEBROPLASTY WITH LUMBAR THREE BIOPSY;  Surgeon: Charlie Pitter, MD;  Location: [facility];  Service: Neurosurgery;  Laterality: N/A;  VERTEBROPLASTY WITH LUMBAR THREE BIOPSY  . Back surgery    .  Lumbar laminectomy for epidural abscess Left 09/22/2013    Procedure: LUMBAR LAMINECTOMY FOR EPIDURAL ABSCESS LEFT LUMBAR FIVE-SACRAL ONE;  Surgeon: Charlie Pitter, MD;  Location: [facility] [facility];  Service: Neurosurgery;  Laterality: Left;  . Bone marrow biopsy Left 02/2014  . Bone marrow aspiration Left 02/2014  . Radiology with anesthesia N/A 08/13/2014    Procedure: RADIOLOGY WITH ANESTHESIA ;  Surgeon: Medication Radiologist, MD;  Location: [facility] [facility];  Service: Radiology;  Laterality: N/A;    There were no vitals filed for this visit.  Visit Diagnosis:  Impaired functional mobility and activity tolerance  Weakness of both legs  Abdominal weakness      Subjective Assessment - 01/14/15 1303    Subjective Pt states she is doing about 30% better than she was before therapy.    Pertinent History Decompression laminectomy on 09/22/2013 with MRSA epidural abscess; relapse of MRSA infection on 10/01/14.    How long can you sit comfortably? no limitations   How long can you stand comfortably? pt states she never stands very much.  30 minutes at the maximum.    How long can you walk comfortably? able to walk 20 minutes was 20 minutes    Patient Stated Goals Improve mobility and endurance   Currently in Pain? No/denies  [facility] PT Assessment - 01/14/15 0001    Assessment   Medical Diagnosis Osteomyelitis of lumbar spine   Next MD Visit [place]- February. Bone density test- 12/14/14   Precautions   Precautions None   Balance Screen   Has the patient fallen in the past 6 months No   Has the patient had a decrease in activity level because of a fear of falling?  No   Is the patient reluctant to leave their home because of a fear of falling?  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   Observation/Other Assessments   Other Surveys  Other Surveys  FACIT total: 109   Functional Tests   Functional tests Single leg stance   Single Leg Stance   Comments Lt 15" , Rt 13" was 20" bilaterally   AROM   Right Hip External Rotation  36   Right Hip Internal Rotation  35   Left Hip External Rotation  37   Left Hip Internal Rotation  34   Lumbar Flexion 85   Lumbar Extension 23   Lumbar - Right Side Bend 18   Lumbar - Left Side Bend 29   Strength   Right Hip Flexion 4/5   Right Hip Extension 4/5  was 3/5   Right Hip ABduction 4+/5  was 3/+    Left Hip Flexion 4+/5  was 4/5    Left Hip Extension 3+/5  was3/5   Left Hip ABduction 4/5  was 4/5   Right Knee Flexion 5/5  as 4/5    Right Knee Extension 5/5  was 4+   Left Knee Flexion 4+/5  ws 4/5    Left Knee Extension 5/5  was 4+   Transfers   Five time sit to stand comments  12.13   6 minute walk test results    Endurance additional comments 1582  was 1200                     [facility] Adult PT Treatment/Exercise - 01/14/15 0001    Lumbar Exercises: Sidelying   Hip Abduction 10 reps   Hip Abduction Weights (lbs) 3   Other Sidelying Lumbar Exercises circles forward and backward x 10 reps B    Lumbar Exercises: Prone   Straight Leg Raise 10 reps   Lumbar Exercises: Quadruped   Straight Leg Raise 15 reps   Opposite Arm/Leg Raise 15 reps   Opposite Arm/Leg Raise Limitations fire hydrant x 10                   PT Short Term Goals - 01/14/15 1338    PT SHORT TERM GOAL #1   Title Pt will be independent with HEP.   Time 2   Period Weeks   Status Partially Met   PT SHORT TERM GOAL #2   Title Improve hip extensor and abductor strength to 4/5 or greater to improve gait mechanics.    Time 2   Period Weeks   Status Partially Met   PT SHORT TERM GOAL #3    Title Pt will complete five time sit to stand in 13 seconds or less  to demonstrate improved functional mobility.    Time 2   Period Weeks   Status Achieved           PT Long Term Goals - 01/14/15 1339    PT LONG TERM GOAL #1   Title Pt will be independent with advanced HEP.    Time 4   Period Weeks   PT LONG TERM GOAL #2   Title Pt will complete five time sit to stand in 11 seconds or less to demonstrate improved functional mobility.   Time 4   Period Weeks   Status Not Met   PT LONG TERM GOAL #3   Title Improve hip abductor and extensor strength to 4+/5 or greater to improve gait mechanics.    Time 4   Period Weeks   Status Not Met   PT LONG TERM GOAL #4   Title Pt will report that she is ambulating 30 minutes or more per day to improve functional activity tolerance and endurance.    Time 4   Period Weeks   Status On-going               Plan - 01/14/15 1343    Clinical Impression Statement Pt reassessed today with noted improvement but continued deficits in strength, power and endurance.  Pt has not shown imporvement in balance activities.  Pt will continue to benefit from skilled PT to improve balance and strength to reduce risk of falling.    Pt will benefit from skilled therapeutic intervention in order to improve on the following deficits Abnormal gait;Decreased activity tolerance;Decreased endurance;Decreased mobility;Decreased range of motion;Decreased strength;Difficulty walking   PT Frequency 2x / week   PT Duration --  Total of 9 weeks    PT Treatment/Interventions ADLs/Self Care Home Management;Gait training;Stair training;Functional mobility training;Therapeutic activities;Therapeutic exercise;Balance training;Neuromuscular re-education;Patient/family education;Manual techniques;Energy conservation   PT Next Visit Plan Begin yoga poses to work on both srength and balance.         Problem List Patient Active Problem List   Diagnosis Date Noted  .  Nausea with vomiting 10/12/2014  . MRSA infection   . Osteomyelitis of lumbar spine ([place]) 08/13/2014  . Acute low back pain 08/13/2014  . Epidural abscess 08/13/2014  . Hypertension   . Chronic back pain   . Essential hypertension   . Other iron deficiency anemias 04/10/2014  . 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 Right hip pain 12/23/2013  . Epidural abscess, L2-L5 11/04/2013  . Sepsis ([place]) 09/21/2013  . Hypercalcemia 08/20/2013  . Hypokalemia 08/20/2013  . Lumbar compression fracture ([place]) 07/12/2013  . Compression fracture 07/12/2013  . Benign neoplasm of colon 12/18/2011  . Esophageal reflux 12/18/2011  . Other dysphagia 12/18/2011  Rayetta Humphrey, PT CLT [phone] 01/14/2015, 1:48 PM  [place] [address] [place], [place], [address] Phone: [phone]   Fax:  [phone]  Name: Autumn Duran MRN: [id number] Date of Birth: Jul 04, 1950  Physical Therapy Progress Note  Dates of Reporting Period:12/11/2014  to 01/13/2013  Objective Reports of Subjective Statement: Pt feels 30% better   Objective Measurements: see above  Goal Update: see above   Plan: continue 2x a week for 4 more weeks   Reason Skilled Services are Required: Pt continues to have decreased strength and balance leaving her at a fall risk.   Rayetta Humphrey, [place] CLT [phone]

## 2015-01-18 ENCOUNTER — Ambulatory Visit (HOSPITAL_COMMUNITY): Payer: BLUE CROSS/BLUE SHIELD | Admitting: Physical Therapy

## 2015-01-18 DIAGNOSIS — R198 Other specified symptoms and signs involving the digestive system and abdomen: Secondary | ICD-10-CM

## 2015-01-18 DIAGNOSIS — R29898 Other symptoms and signs involving the musculoskeletal system: Secondary | ICD-10-CM

## 2015-01-18 DIAGNOSIS — Z7409 Other reduced mobility: Secondary | ICD-10-CM

## 2015-01-18 NOTE — Therapy (Signed)
[place], [place], [address] Phone: [phone]   Fax:  [phone]  Physical Therapy Treatment  Patient Details  Name: TAELOR WAYMIRE MRN: [id number] Date of Birth: 23-Jul-1950 No Data Recorded  Encounter Date: 01/18/2015      PT End of Session - 01/18/15 1354    Visit Number 10   Number of Visits 17   Date for PT Re-Evaluation 02/03/15   Authorization Type BCBS   Authorization Time Period 12/11/14-02/08/15   PT Start Time 1018   PT Stop Time 1100   PT Time Calculation (min) 42 min   Equipment Utilized During Treatment Gait belt   Activity Tolerance Patient tolerated treatment well   Behavior During Therapy [facility] for tasks assessed/performed      Past Medical History  Diagnosis Date  . Diverticulosis   . Hemorrhoids   . Hx of adenomatous colonic polyps   . Hiatal hernia   . Hypertension   . Hyperlipidemia   . GERD (gastroesophageal reflux disease)   . Chronic kidney disease     kidney stone s/p stent placement ( removed)  . Complication of anesthesia   . PONV (postoperative nausea and vomiting)   . Bronchitis, chronic ([place])   . Chronic back pain   . Lumbar radicular pain   . Compression fracture of L3 lumbar vertebra (HCC)   . T7 vertebral fracture ([place])   . Smoldering myeloma ([place])   . Sore throat     Past Surgical History  Procedure Laterality Date  . Cholecystectomy    . Esophagogastroduodenoscopy  12/18/2011    Procedure: ESOPHAGOGASTRODUODENOSCOPY (EGD);  Surgeon: Lafayette Dragon, MD;  Location: [facility];  Service: Endoscopy;  Laterality: N/A;  . Colonoscopy  12/18/2011    Procedure: COLONOSCOPY;  Surgeon: Lafayette Dragon, MD;  Location: [facility];  Service: Endoscopy;  Laterality: N/A;  . Ureteral stent placement    . Vertebroplasty N/A 07/14/2013    Procedure: VERTEBROPLASTY WITH LUMBAR THREE BIOPSY;  Surgeon: Charlie Pitter, MD;  Location: [place] [facility];  Service: Neurosurgery;   Laterality: N/A;  VERTEBROPLASTY WITH LUMBAR THREE BIOPSY  . Back surgery    . Lumbar laminectomy for epidural abscess Left 09/22/2013    Procedure: LUMBAR LAMINECTOMY FOR EPIDURAL ABSCESS LEFT LUMBAR FIVE-SACRAL ONE;  Surgeon: Charlie Pitter, MD;  Location: [place] [facility];  Service: Neurosurgery;  Laterality: Left;  . Bone marrow biopsy Left 02/2014  . Bone marrow aspiration Left 02/2014  . Radiology with anesthesia N/A 08/13/2014    Procedure: RADIOLOGY WITH ANESTHESIA ;  Surgeon: Medication Radiologist, MD;  Location: [place] [facility];  Service: Radiology;  Laterality: N/A;    There were no vitals filed for this visit.  Visit Diagnosis:  Impaired functional mobility and activity tolerance  Weakness of both legs  Abdominal weakness      Subjective Assessment - 01/18/15 1019    Subjective Pt reports that she was feeling really good this morning, but after the drive to therapy, she feels a little stiff.    Currently in Pain? Yes   Pain Score 3    Pain Location Back   Pain Orientation Lower                  [facility] Adult PT Treatment/Exercise - 01/18/15 0001    Lumbar Exercises: Stretches   Active Hamstring Stretch 3 reps;30 seconds;Limitations   Active Hamstring Stretch Limitations 14" step   Piriformis Stretch 3 reps;30 seconds  Piriformis Stretch Limitations seated   Lumbar Exercises: Standing   Other Standing Lumbar Exercises sidestepping with GTB x 2RT   Lumbar Exercises: Seated   Long Arc Quad on [place] 15 reps   Hip Flexion on Ball 15 reps   Lumbar Exercises: Supine   Bridge 15 reps   Bridge Limitations 2 sets   Lumbar Exercises: Sidelying   Hip Abduction 15 reps   Other Sidelying Lumbar Exercises circles forward and backward x 10 reps B    Lumbar Exercises: Prone   Straight Leg Raise 10 reps   Straight Leg Raises Limitations 2 sets             Balance Exercises - 01/18/15 1351    Balance Exercises: Standing   Balance Beam 2RT tandem gait, sidestepping x 2RT              PT Short Term Goals - 01/14/15 1338    PT SHORT TERM GOAL #1   Title Pt will be independent with HEP.   Time 2   Period Weeks   Status Partially Met   PT SHORT TERM GOAL #2   Title Improve hip extensor and abductor strength to 4/5 or greater to improve gait mechanics.    Time 2   Period Weeks   Status Partially Met   PT SHORT TERM GOAL #3   Title Pt will complete five time sit to stand in 13 seconds or less to demonstrate improved functional mobility.    Time 2   Period Weeks   Status Achieved           PT Long Term Goals - 01/14/15 1339    PT LONG TERM GOAL #1   Title Pt will be independent with advanced HEP.    Time 4   Period Weeks   PT LONG TERM GOAL #2   Title Pt will complete five time sit to stand in 11 seconds or less to demonstrate improved functional mobility.   Time 4   Period Weeks   Status Not Met   PT LONG TERM GOAL #3   Title Improve hip abductor and extensor strength to 4+/5 or greater to improve gait mechanics.    Time 4   Period Weeks   Status Not Met   PT LONG TERM GOAL #4   Title Pt will report that she is ambulating 30 minutes or more per day to improve functional activity tolerance and endurance.    Time 4   Period Weeks   Status On-going               Plan - 01/18/15 1355    Clinical Impression Statement Continued with core and LE strengthening in today's treatment, added balance beam activities to challenge dynamic balance on unstable surface. Pt required CGA for safety during balance beam activities, experienced LOB x 2 that she was able to recover with CGA. Pt required cueing during bridging to complete full motion. Pt denied any increased pain post treatment.    PT Next Visit Plan Continue with core and LE strengthening, balance training        Problem List Patient Active Problem List   Diagnosis Date Noted  . Nausea with vomiting 10/12/2014  . MRSA infection   . Osteomyelitis of lumbar spine ([place])  08/13/2014  . Acute low back pain 08/13/2014  . Epidural abscess 08/13/2014  . Hypertension   . Chronic back pain   . Essential hypertension   . Other iron deficiency anemias 04/10/2014  .  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 Right hip pain 12/23/2013  . Epidural abscess, L2-L5 11/04/2013  . Sepsis ([place]) 09/21/2013  . Hypercalcemia 08/20/2013  . Hypokalemia 08/20/2013  . Lumbar compression fracture ([place]) 07/12/2013  . Compression fracture 07/12/2013  . Benign neoplasm of colon 12/18/2011  . Esophageal reflux 12/18/2011  . Other dysphagia 12/18/2011    Hilma Favors, PT, DPT [phone] 01/18/2015, 1:58 PM  [place] [address] [place], [place], [address] Phone: [phone]   Fax:  [phone]  Name: VALLA PACEY MRN: [id number] Date of Birth: January 12, 1951

## 2015-01-20 ENCOUNTER — Ambulatory Visit (HOSPITAL_COMMUNITY): Payer: BLUE CROSS/BLUE SHIELD | Admitting: Physical Therapy

## 2015-01-20 ENCOUNTER — Telehealth (HOSPITAL_COMMUNITY): Payer: Self-pay | Admitting: Physical Therapy

## 2015-01-20 NOTE — Telephone Encounter (Signed)
She had to babysit

## 2015-01-26 ENCOUNTER — Ambulatory Visit (HOSPITAL_COMMUNITY): Payer: BLUE CROSS/BLUE SHIELD | Admitting: Physical Therapy

## 2015-01-28 ENCOUNTER — Ambulatory Visit (HOSPITAL_COMMUNITY): Payer: BLUE CROSS/BLUE SHIELD | Admitting: Physical Therapy

## 2015-01-29 ENCOUNTER — Ambulatory Visit (HOSPITAL_COMMUNITY): Payer: BLUE CROSS/BLUE SHIELD | Attending: Hematology & Oncology | Admitting: Physical Therapy

## 2015-01-29 ENCOUNTER — Ambulatory Visit (HOSPITAL_COMMUNITY): Payer: BLUE CROSS/BLUE SHIELD | Admitting: Physical Therapy

## 2015-01-29 DIAGNOSIS — R29898 Other symptoms and signs involving the musculoskeletal system: Secondary | ICD-10-CM

## 2015-01-29 DIAGNOSIS — Z7409 Other reduced mobility: Secondary | ICD-10-CM

## 2015-01-29 DIAGNOSIS — R198 Other specified symptoms and signs involving the digestive system and abdomen: Secondary | ICD-10-CM

## 2015-01-29 NOTE — Therapy (Signed)
[place], [place], [address] Phone: [phone]   Fax:  [phone]  Physical Therapy Treatment  Patient Details  Name: [place] Duran MRN: [id number] Date of Birth: [birth year] No Data Recorded  Encounter Date: 01/29/2015      PT End of Session - 01/29/15 1625    Visit Number 11   Number of Visits 17   Date for PT Re-Evaluation 02/03/15   Authorization Type BCBS   Authorization Time Period 12/11/14-02/08/15   PT Start Time 1303   PT Stop Time 1345   PT Time Calculation (min) 42 min   Equipment Utilized During Treatment Gait belt   Activity Tolerance Patient tolerated treatment well   Behavior During Therapy [facility] for tasks assessed/performed      Past Medical History  Diagnosis Date  . Diverticulosis   . Hemorrhoids   . Hx of adenomatous colonic polyps   . Hiatal hernia   . Hypertension   . Hyperlipidemia   . GERD (gastroesophageal reflux disease)   . Chronic kidney disease     kidney stone s/p stent placement ( removed)  . Complication of anesthesia   . PONV (postoperative nausea and vomiting)   . Bronchitis, chronic ([place])   . Chronic back pain   . Lumbar radicular pain   . Compression fracture of L3 lumbar vertebra (HCC)   . T7 vertebral fracture ([place])   . Smoldering myeloma ([place])   . Sore throat     Past Surgical History  Procedure Laterality Date  . Cholecystectomy    . Esophagogastroduodenoscopy  12/18/2011    Procedure: ESOPHAGOGASTRODUODENOSCOPY (EGD);  Surgeon: Lafayette Dragon, MD;  Location: [facility];  Service: Endoscopy;  Laterality: N/A;  . Colonoscopy  12/18/2011    Procedure: COLONOSCOPY;  Surgeon: Lafayette Dragon, MD;  [facility];  Service: Endoscopy;  Laterality: N/A;  . Ureteral stent placement    . Vertebroplasty N/A 07/14/2013    Procedure: VERTEBROPLASTY WITH LUMBAR THREE BIOPSY;  Surgeon: Charlie Pitter, MD;  Location: [place] [facility];  Service: Neurosurgery;   Laterality: N/A;  VERTEBROPLASTY WITH LUMBAR THREE BIOPSY  . Back surgery    . Lumbar laminectomy for epidural abscess Left 09/22/2013    Procedure: LUMBAR LAMINECTOMY FOR EPIDURAL ABSCESS LEFT LUMBAR FIVE-SACRAL ONE;  Surgeon: Charlie Pitter, MD;  Location: [place] [facility];  Service: Neurosurgery;  Laterality: Left;  . Bone marrow biopsy Left 02/2014  . Bone marrow aspiration Left 02/2014  . Radiology with anesthesia N/A 08/13/2014    Procedure: RADIOLOGY WITH ANESTHESIA ;  Surgeon: Medication Radiologist, MD;  Location: [place] [facility];  Service: Radiology;  Laterality: N/A;    There were no vitals filed for this visit.  Visit Diagnosis:  Impaired functional mobility and activity tolerance  Weakness of both legs  Abdominal weakness      Subjective Assessment - 01/29/15 1308    Subjective Pt reports that her pain has been much better, but she has been very lazy lately.    Currently in Pain? No/denies   Pain Score 0-No pain                         [facility] Adult PT Treatment/Exercise - 01/29/15 0001    Lumbar Exercises: Stretches   Active Hamstring Stretch 3 reps;30 seconds;Limitations   Active Hamstring Stretch Limitations 14" step   Piriformis Stretch 3 reps;30 seconds   Piriformis Stretch Limitations seated   Lumbar  Exercises: Standing   Other Standing Lumbar Exercises sidestepping with GTB x 2RT   Lumbar Exercises: Supine   Bridge 15 reps   Bridge Limitations 2 sets   Lumbar Exercises: Sidelying   Hip Abduction 15 reps   Other Sidelying Lumbar Exercises circles forward and backward x 10 reps B    Lumbar Exercises: Prone   Straight Leg Raise 15 reps   Lumbar Exercises: Quadruped   Opposite Arm/Leg Raise 15 reps   Opposite Arm/Leg Raise Limitations fire hydrant x 10              Balance Exercises - 01/29/15 1346    Balance Exercises: Standing   Balance Beam 2RT tandem gait, sidestepping x 2RT             PT Short Term Goals - 01/14/15 1338    PT  SHORT TERM GOAL #1   Title Pt will be independent with HEP.   Time 2   Period Weeks   Status Partially Met   PT SHORT TERM GOAL #2   Title Improve hip extensor and abductor strength to 4/5 or greater to improve gait mechanics.    Time 2   Period Weeks   Status Partially Met   PT SHORT TERM GOAL #3   Title Pt will complete five time sit to stand in 13 seconds or less to demonstrate improved functional mobility.    Time 2   Period Weeks   Status Achieved           PT Long Term Goals - 01/14/15 1339    PT LONG TERM GOAL #1   Title Pt will be independent with advanced HEP.    Time 4   Period Weeks   PT LONG TERM GOAL #2   Title Pt will complete five time sit to stand in 11 seconds or less to demonstrate improved functional mobility.   Time 4   Period Weeks   Status Not Met   PT LONG TERM GOAL #3   Title Improve hip abductor and extensor strength to 4+/5 or greater to improve gait mechanics.    Time 4   Period Weeks   Status Not Met   PT LONG TERM GOAL #4   Title Pt will report that she is ambulating 30 minutes or more per day to improve functional activity tolerance and endurance.    Time 4   Period Weeks   Status On-going               Plan - 01/29/15 1626    Clinical Impression Statement Continued with functional strengthening and balance training in today's treatment. Pt had increased fatigue during today's session, with reports that she hasn't been as compliant with her HEP. Pt was educated on importance of maintaining gains made in PT by copmlying with HEP, pt reported understanding. She required verbal and tactile cueing during quadruped arm/leg extension to maintain neutral spine and to avoid compensation.    PT Next Visit Plan Progress core strengthening, add oblique punches.         Problem List Patient Active Problem List   Diagnosis Date Noted  . Nausea with vomiting 10/12/2014  . MRSA infection   . Osteomyelitis of lumbar spine ([place]) 08/13/2014   . Acute low back pain 08/13/2014  . Epidural abscess 08/13/2014  . Hypertension   . Chronic back pain   . Essential hypertension   . Other iron deficiency anemias 04/10/2014  . 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  Right hip pain 12/23/2013  . Epidural abscess, L2-L5 11/04/2013  . Sepsis ([place]) 09/21/2013  . Hypercalcemia 08/20/2013  . Hypokalemia 08/20/2013  . Lumbar compression fracture ([place]) 07/12/2013  . Compression fracture 07/12/2013  . Benign neoplasm of colon 12/18/2011  . Esophageal reflux 12/18/2011  . Other dysphagia 12/18/2011    Hilma Favors, PT, DPT [phone] 01/29/2015, 4:32 PM  [place] [address] [place], [place], [address] Phone: [phone]   Fax:  [phone]  Name: VIOLANDA BOBECK MRN: [id number] Date of Birth: Jan 12, 1951

## 2015-02-02 ENCOUNTER — Ambulatory Visit (HOSPITAL_COMMUNITY): Payer: BLUE CROSS/BLUE SHIELD | Admitting: Physical Therapy

## 2015-02-02 DIAGNOSIS — R29898 Other symptoms and signs involving the musculoskeletal system: Secondary | ICD-10-CM

## 2015-02-02 DIAGNOSIS — Z7409 Other reduced mobility: Secondary | ICD-10-CM | POA: Diagnosis not present

## 2015-02-02 DIAGNOSIS — R198 Other specified symptoms and signs involving the digestive system and abdomen: Secondary | ICD-10-CM

## 2015-02-02 NOTE — Therapy (Signed)
[place], [place], [address] Phone: [phone]   Fax:  [phone]  Physical Therapy Treatment  Patient Details  Name: [place] Duran MRN: [id number] Date of Birth: 1950-08-01 No Data Recorded  Encounter Date: 02/02/2015      PT End of Session - 02/02/15 1305    Visit Number 12   Number of Visits 17   Authorization Type BCBS   Authorization Time Period 12/11/14-02/08/15   PT Start Time 0845   PT Stop Time 0920   PT Time Calculation (min) 35 min   Activity Tolerance Patient tolerated treatment well   Behavior During Therapy [facility] for tasks assessed/performed      Past Medical History  Diagnosis Date  . Diverticulosis   . Hemorrhoids   . Hx of adenomatous colonic polyps   . Hiatal hernia   . Hypertension   . Hyperlipidemia   . GERD (gastroesophageal reflux disease)   . Chronic kidney disease     kidney stone s/p stent placement ( removed)  . Complication of anesthesia   . PONV (postoperative nausea and vomiting)   . Bronchitis, chronic ([place])   . Chronic back pain   . Lumbar radicular pain   . Compression fracture of L3 lumbar vertebra (HCC)   . T7 vertebral fracture ([place])   . Smoldering myeloma ([place])   . Sore throat     Past Surgical History  Procedure Laterality Date  . Cholecystectomy    . Esophagogastroduodenoscopy  12/18/2011    Procedure: ESOPHAGOGASTRODUODENOSCOPY (EGD);  Surgeon: Lafayette Dragon, MD;  [facility];  Service: Endoscopy;  Laterality: N/A;  . Colonoscopy  12/18/2011    Procedure: COLONOSCOPY;  Surgeon: Lafayette Dragon, MD;  [facility];  Service: Endoscopy;  Laterality: N/A;  . Ureteral stent placement    . Vertebroplasty N/A 07/14/2013    Procedure: VERTEBROPLASTY WITH LUMBAR THREE BIOPSY;  Surgeon: Charlie Pitter, MD;  Location: [place] [facility];  Service: Neurosurgery;  Laterality: N/A;  VERTEBROPLASTY WITH LUMBAR THREE BIOPSY  . Back surgery    . Lumbar  laminectomy for epidural abscess Left 09/22/2013    Procedure: LUMBAR LAMINECTOMY FOR EPIDURAL ABSCESS LEFT LUMBAR FIVE-SACRAL ONE;  Surgeon: Charlie Pitter, MD;  Location: [place] [facility];  Service: Neurosurgery;  Laterality: Left;  . Bone marrow biopsy Left 02/2014  . Bone marrow aspiration Left 02/2014  . Radiology with anesthesia N/A 08/13/2014    Procedure: RADIOLOGY WITH ANESTHESIA ;  Surgeon: Medication Radiologist, MD;  Location: [place] [facility];  Service: Radiology;  Laterality: N/A;    There were no vitals filed for this visit.  Visit Diagnosis:  Impaired functional mobility and activity tolerance  Weakness of both legs  Abdominal weakness      Subjective Assessment - 02/02/15 0848    Subjective Pt reports that she is no longer experiencing any back pain, unless she is sitting or riding in the car for a long period of time.    How long can you sit comfortably? no limitations   How long can you stand comfortably? pt states she never stands very much.  30 minutes at the maximum.    How long can you walk comfortably? 20-30 minutes   Currently in Pain? No/denies   Pain Score 0-No pain            [facility] PT Assessment - 02/02/15 0001    Single Leg Stance   Comments LLE 39", RLE 25"  Strength   Right Hip Flexion 4+/5   Right Hip Extension 4-/5   Right Hip ABduction 4+/5   Left Hip Flexion 4+/5   Left Hip Extension 3+/5   Left Hip ABduction 4+/5   Right Knee Flexion 5/5   Right Knee Extension 5/5   Left Knee Flexion 5/5   Left Knee Extension 5/5   Transfers   Five time sit to stand comments  11.50"   6 minute walk test results    Endurance additional comments 1250                  PT Short Term Goals - 02/02/15 1310    PT SHORT TERM GOAL #1   Title Pt will be independent with HEP.   Time 2   Period Weeks   Status Achieved   PT SHORT TERM GOAL #2   Title Improve hip extensor and abductor strength to 4/5 or greater to improve gait mechanics.    Time 2    Period Weeks   Status Partially Met   PT SHORT TERM GOAL #3   Title Pt will complete five time sit to stand in 13 seconds or less to demonstrate improved functional mobility.    Time 2   Period Weeks   Status Achieved           PT Long Term Goals - 02/02/15 1310    PT LONG TERM GOAL #1   Title Pt will be independent with advanced HEP.    Time 4   Period Weeks   Status Achieved   PT LONG TERM GOAL #2   Title Pt will complete five time sit to stand in 11 seconds or less to demonstrate improved functional mobility.   Time 4   Period Weeks   Status Achieved   PT LONG TERM GOAL #3   Title Improve hip abductor and extensor strength to 4+/5 or greater to improve gait mechanics.    Time 4   Period Weeks   Status Partially Met   PT LONG TERM GOAL #4   Title Pt will report that she is ambulating 30 minutes or more per day to improve functional activity tolerance and endurance.    Time 4   Period Weeks   Status Achieved               Plan - 02/02/15 1305    Clinical Impression Statement Pt presented to PT reporting that she feels that she has made a great improvement with PT, and she would like to be discharged to continue independently at this time. Reassessment was completed, and pt demonstrates improvements in BLE strength, functional activity tolerance, gait speed, and functional strength and mobility. Pt has met 3/4 LTGs and has partially met the 4th LTG. Pt demonstrates good compliance with her HEP, and is appropriate to d/c at this time to continue independently.    PT Next Visit Plan d/c to HEP        Problem List Patient Active Problem List   Diagnosis Date Noted  . Nausea with vomiting 10/12/2014  . MRSA infection   . Osteomyelitis of lumbar spine ([place]) 08/13/2014  . Acute low back pain 08/13/2014  . Epidural abscess 08/13/2014  . Hypertension   . Chronic back pain   . Essential hypertension   . Other iron deficiency anemias 04/10/2014  . Plasmacytoma of  bone ([place]) 03/23/2014  . Right hip pain 12/23/2013  . Epidural abscess, L2-L5 11/04/2013  . Sepsis ([place]) 09/21/2013  .  Hypercalcemia 08/20/2013  . Hypokalemia 08/20/2013  . Lumbar compression fracture ([place]) 07/12/2013  . Compression fracture 07/12/2013  . Benign neoplasm of colon 12/18/2011  . Esophageal reflux 12/18/2011  . Other dysphagia 12/18/2011     PHYSICAL THERAPY DISCHARGE SUMMARY  Visits from Start of Care: 12  Current functional level related to goals / functional outcomes: Pt demonstrates improvements in BLE strength, functional activity tolerance, core strength, and mobility.    Remaining deficits: Pt continues to demonstrate weakness in hip extensors.    Education / Equipment: HEP  Plan: Patient agrees to discharge.  Patient goals were partially met. Patient is being discharged due to meeting the stated rehab goals.  ?????        Hilma Favors, PT, DPT [phone] 02/02/2015, 1:11 PM  [place] [address] [place], [place], [address] Phone: [phone]   Fax:  [phone]  Name: Aut[place] Duran MRN: [id number] Date of Birth: [birth year]

## 2015-02-04 ENCOUNTER — Ambulatory Visit (HOSPITAL_COMMUNITY): Payer: BLUE CROSS/BLUE SHIELD | Admitting: Physical Therapy

## 2015-02-08 ENCOUNTER — Ambulatory Visit (HOSPITAL_COMMUNITY): Payer: BLUE CROSS/BLUE SHIELD | Admitting: Physical Therapy

## 2015-02-09 ENCOUNTER — Encounter: Payer: Self-pay | Admitting: Internal Medicine

## 2015-02-09 ENCOUNTER — Ambulatory Visit (HOSPITAL_COMMUNITY): Payer: BLUE CROSS/BLUE SHIELD

## 2015-02-09 ENCOUNTER — Ambulatory Visit (INDEPENDENT_AMBULATORY_CARE_PROVIDER_SITE_OTHER): Payer: BLUE CROSS/BLUE SHIELD | Admitting: Internal Medicine

## 2015-02-09 ENCOUNTER — Other Ambulatory Visit (HOSPITAL_COMMUNITY): Payer: BLUE CROSS/BLUE SHIELD

## 2015-02-09 VITALS — BP 165/82 | HR 54 | Temp 97.5°F | Wt 157.0 lb

## 2015-02-09 DIAGNOSIS — G061 Intraspinal abscess and granuloma: Secondary | ICD-10-CM | POA: Diagnosis not present

## 2015-02-09 DIAGNOSIS — Z23 Encounter for immunization: Secondary | ICD-10-CM | POA: Diagnosis not present

## 2015-02-09 NOTE — Progress Notes (Signed)
[facility] for Infectious Disease  Patient Active Problem List   Diagnosis Date Noted  . Right hip pain 12/23/2013    Priority: High  . Epidural abscess, L2-L5 11/04/2013    Priority: High  . Nausea with vomiting 10/12/2014  . MRSA infection   . Osteomyelitis of lumbar spine ([place]) 08/13/2014  . Acute low back pain 08/13/2014  . Epidural abscess 08/13/2014  . Hypertension   . Chronic back pain   . Essential hypertension   . Other iron deficiency anemias 04/10/2014  . 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 Sepsis ([place]) 09/21/2013  . Hypercalcemia 08/20/2013  . Hypokalemia 08/20/2013  . Lumbar compression fracture ([place]) 07/12/2013  . Compression fracture 07/12/2013  . Benign neoplasm of colon 12/18/2011  . Esophageal reflux 12/18/2011  . Other dysphagia 12/18/2011    Patient's Medications  New Prescriptions   No medications on file  Previous Medications   DIAZEPAM (VALIUM) 5 MG TABLET    Take 1 tablet (5 mg total) by mouth every 8 (eight) hours as needed for anxiety.   DOXYCYCLINE (VIBRA-TABS) 100 MG TABLET    Take 1 tablet (100 mg total) by mouth 2 (two) times daily.   OXYCODONE (OXYCONTIN) 20 MG T12A 12 HR TABLET    Take 1 tablet (20 mg total) by mouth every 12 (twelve) hours.   PROMETHAZINE (PHENERGAN) 12.5 MG TABLET    Take 1 tablet (12.5 mg total) by mouth every 6 (six) hours as needed for nausea or vomiting.   RABEPRAZOLE (ACIPHEX) 20 MG TABLET    Take 1 tablet (20 mg total) by mouth daily.   ZOLEDRONIC ACID (RECLAST) 5 MG/100ML SOLN INJECTION    Inject 5 mg into the vein once.  Modified Medications   No medications on file  Discontinued Medications   No medications on file    Subjective: Autumn Duran is in for her routine follow-up visit. She is doing better. She remains on her OxyContin twice daily. She occasionally has a little bit of stiffness and takes Advil as needed but states that her low back pain and right hip pain have resolved. His longer she  takes her doxycycline with food she does not have any problem with nausea. She has not had any problem with photosensitivity. She saw Dr. Trenton Gammon recently and will be scheduled to have a repeat lumbar MRI in March  Review of Systems: Review of Systems  Constitutional: Negative for fever, chills and diaphoresis.  Gastrointestinal: Negative for nausea and vomiting.  Musculoskeletal: Negative for back pain and joint pain.  Psychiatric/Behavioral: Negative for depression. The patient is nervous/anxious.     Past Medical History  Diagnosis Date  . Diverticulosis   . Hemorrhoids   . Hx of adenomatous colonic polyps   . Hiatal hernia   . Hypertension   . Hyperlipidemia   . GERD (gastroesophageal reflux disease)   . Chronic kidney disease     kidney stone s/p stent placement ( removed)  . Complication of anesthesia   . PONV (postoperative nausea and vomiting)   . Bronchitis, chronic ([place])   . Chronic back pain   . Lumbar radicular pain   . Compression fracture of L3 lumbar vertebra ([facility])   . T7 vertebral fracture ([place])   . Smoldering myeloma ([place])   . Sore throat     Social History  Substance Use Topics  . Smoking status: Never Smoker   . Smokeless tobacco: Never Used  . Alcohol Use: 0.0  oz/week    0 Standard drinks or equivalent per week     Comment: social occ    Family History  Problem Relation Age of Onset  . Breast cancer Sister 33  . Breast cancer Other 43    aunt    Allergies  Allergen Reactions  . Morphine And Related Other (See Comments)    Headache    Objective: Filed Vitals:   02/09/15 1033  BP: 165/82  Pulse: 54  Temp: 97.5 F (36.4 C)  TempSrc: Oral  Weight: 157 lb (71.215 kg)   Body mass index is 29.19 kg/(m^2).  Physical Exam  Constitutional: She is oriented to person, place, and time.  She is smiling and in good spirits.  Cardiovascular: Regular rhythm.   No murmur heard. Pulmonary/Chest: Breath sounds normal.  Neurological: She is alert  and oriented to person, place, and time. Gait normal.  Skin: No rash noted.  Psychiatric: Mood and affect normal.    Lab Results    Problem List Items Addressed This Visit      High   Epidural abscess, L2-L5 - Primary    She has now been back on antibiotic therapy for 6 months for her relapsed MRSA lumbar epidural infection with epidural abscess. Given the recurrent nature of her problem and the severity of the problem she will stay on doxycycline for now. She will follow-up with me after her MRI in March.          Michel Bickers, MD [facility] [phone] pager   [phone] cell 02/09/2015, 10:57 AM

## 2015-02-09 NOTE — Assessment & Plan Note (Signed)
She has now been back on antibiotic therapy for 6 months for her relapsed MRSA lumbar epidural infection with epidural abscess. Given the recurrent nature of her problem and the severity of the problem she will stay on doxycycline for now. She will follow-up with me after her MRI in March.

## 2015-02-09 NOTE — Addendum Note (Signed)
Addended by: Lorne Skeens D on: 02/09/2015 11:04 AM   Modules accepted: Orders

## 2015-02-11 ENCOUNTER — Ambulatory Visit (HOSPITAL_COMMUNITY): Payer: BLUE CROSS/BLUE SHIELD | Admitting: Physical Therapy

## 2015-02-12 ENCOUNTER — Encounter (HOSPITAL_COMMUNITY): Payer: BLUE CROSS/BLUE SHIELD

## 2015-02-12 ENCOUNTER — Encounter (HOSPITAL_COMMUNITY): Payer: BLUE CROSS/BLUE SHIELD | Attending: Hematology & Oncology

## 2015-02-12 VITALS — BP 125/78 | HR 66 | Temp 98.0°F | Resp 18

## 2015-02-12 DIAGNOSIS — C9030 Solitary plasmacytoma not having achieved remission: Secondary | ICD-10-CM | POA: Diagnosis not present

## 2015-02-12 DIAGNOSIS — Z780 Asymptomatic menopausal state: Secondary | ICD-10-CM | POA: Diagnosis not present

## 2015-02-12 DIAGNOSIS — M4626 Osteomyelitis of vertebra, lumbar region: Secondary | ICD-10-CM | POA: Diagnosis present

## 2015-02-12 DIAGNOSIS — D508 Other iron deficiency anemias: Secondary | ICD-10-CM

## 2015-02-12 LAB — COMPREHENSIVE METABOLIC PANEL
ALT: 14 U/L (ref 14–54)
AST: 25 U/L (ref 15–41)
Albumin: 3.9 g/dL (ref 3.5–5.0)
Alkaline Phosphatase: 86 U/L (ref 38–126)
Anion gap: 5 (ref 5–15)
BUN: 18 mg/dL (ref 6–20)
CO2: 27 mmol/L (ref 22–32)
Calcium: 10.1 mg/dL (ref 8.9–10.3)
Chloride: 111 mmol/L (ref 101–111)
Creatinine, Ser: 0.81 mg/dL (ref 0.44–1.00)
GFR calc Af Amer: >60 mL/min (ref >60)
GFR calc non Af Amer: >60 mL/min (ref >60)
Glucose, Bld: 83 mg/dL (ref 65–99)
Potassium: 4 mmol/L (ref 3.5–5.1)
Sodium: 143 mmol/L (ref 135–145)
Total Bilirubin: 0.8 mg/dL (ref 0.3–1.2)
Total Protein: 6.7 g/dL (ref 6.5–8.1)

## 2015-02-12 MED ORDER — SODIUM CHLORIDE 0.9 % IV SOLN
Freq: Once | INTRAVENOUS | Status: AC
  Administered 2015-02-12: 14:00:00 via INTRAVENOUS

## 2015-02-12 MED ORDER — ZOLEDRONIC ACID 4 MG/5ML IV CONC
5.0000 mg | Freq: Once | INTRAVENOUS | Status: AC
  Administered 2015-02-12: 5 mg via INTRAVENOUS
  Filled 2015-02-12: qty 6.25

## 2015-02-12 NOTE — Progress Notes (Signed)
1500:  Tolerated infusion w/o adverse reaction. A&Ox4, VSS.  In no distress.  Discharged ambulatory.

## 2015-02-12 NOTE — Patient Instructions (Addendum)
[place] at [facility] Discharge Instructions  RECOMMENDATIONS MADE BY THE CONSULTANT AND ANY TEST RESULTS WILL BE SENT TO YOUR REFERRING PHYSICIAN.  Reclast infusion today. Return as scheduled for lab work and office visit.   Thank you for choosing [place] at [facility] to provide your oncology and hematology care.  To afford each patient quality time with our provider, please arrive at least 15 minutes before your scheduled appointment time.    You need to re-schedule your appointment should you arrive 10 or more minutes late.  We strive to give you quality time with our providers, and arriving late affects you and other patients whose appointments are after yours.  Also, if you no show three or more times for appointments you may be dismissed from the clinic at the providers discretion.     Again, thank you for choosing [facility].  Our hope is that these requests will decrease the amount of time that you wait before being seen by our physicians.       _____________________________________________________________  Should you have questions after your visit to [facility], please contact our office at [phone] between the hours of 8:30 a.m. and 4:30 p.m.  Voicemails left after 4:30 p.m. will not be returned until the following business day.  For prescription refill requests, have your pharmacy contact our office.   Zoledronic Acid injection (Paget's Disease, Osteoporosis) What is this medicine? ZOLEDRONIC ACID (ZOE le dron ik AS id) lowers the amount of calcium loss from bone. It is used to treat Paget's disease and osteoporosis in women. This medicine may be used for other purposes; ask your health care provider or pharmacist if you have questions. What should I tell my health care provider before I take this medicine? They need to know if you have any of these conditions: -aspirin-sensitive asthma -cancer,  especially if you are receiving medicines used to treat cancer -dental disease or wear dentures -infection -kidney disease -low levels of calcium in the blood -past surgery on the parathyroid gland or intestines -receiving corticosteroids like dexamethasone or prednisone -an unusual or allergic reaction to zoledronic acid, other medicines, foods, dyes, or preservatives -pregnant or trying to get pregnant -breast-feeding How should I use this medicine? This medicine is for infusion into a vein. It is given by a health care professional in a hospital or clinic setting. Talk to your pediatrician regarding the use of this medicine in children. This medicine is not approved for use in children. Overdosage: If you think you have taken too much of this medicine contact a poison control center or emergency room at once. NOTE: This medicine is only for you. Do not share this medicine with others. What if I miss a dose? It is important not to miss your dose. Call your doctor or health care professional if you are unable to keep an appointment. What may interact with this medicine? -certain antibiotics given by injection -NSAIDs, medicines for pain and inflammation, like ibuprofen or naproxen -some diuretics like bumetanide, furosemide -teriparatide This list may not describe all possible interactions. Give your health care provider a list of all the medicines, herbs, non-prescription drugs, or dietary supplements you use. Also tell them if you smoke, drink alcohol, or use illegal drugs. Some items may interact with your medicine. What should I watch for while using this medicine? Visit your doctor or health care professional for regular checkups. It may be some time  before you see the benefit from this medicine. Do not stop taking your medicine unless your doctor tells you to. Your doctor may order blood tests or other tests to see how you are doing. Women should inform their doctor if they wish to  become pregnant or think they might be pregnant. There is a potential for serious side effects to an unborn child. Talk to your health care professional or pharmacist for more information. You should make sure that you get enough calcium and vitamin D while you are taking this medicine. Discuss the foods you eat and the vitamins you take with your health care professional. Some people who take this medicine have severe bone, joint, and/or muscle pain. This medicine may also increase your risk for jaw problems or a broken thigh bone. Tell your doctor right away if you have severe pain in your jaw, bones, joints, or muscles. Tell your doctor if you have any pain that does not go away or that gets worse. Tell your dentist and dental surgeon that you are taking this medicine. You should not have major dental surgery while on this medicine. See your dentist to have a dental exam and fix any dental problems before starting this medicine. Take good care of your teeth while on this medicine. Make sure you see your dentist for regular follow-up appointments. What side effects may I notice from receiving this medicine? Side effects that you should report to your doctor or health care professional as soon as possible: -allergic reactions like skin rash, itching or hives, swelling of the face, lips, or tongue -anxiety, confusion, or depression -breathing problems -changes in vision -eye pain -feeling faint or lightheaded, falls -jaw pain, especially after dental work -mouth sores -muscle cramps, stiffness, or weakness -redness, blistering, peeling or loosening of the skin, including inside the mouth -trouble passing urine or change in the amount of urine Side effects that usually do not require medical attention (report to your doctor or health care professional if they continue or are bothersome): -bone, joint, or muscle pain -constipation -diarrhea -fever -hair loss -irritation at site where  injected -loss of appetite -nausea, vomiting -stomach upset -trouble sleeping -trouble swallowing -weak or tired This list may not describe all possible side effects. Call your doctor for medical advice about side effects. You may report side effects to FDA at 1-800-FDA-1088. Where should I keep my medicine? This drug is given in a hospital or clinic and will not be stored at home. NOTE: This sheet is a summary. It may not cover all possible information. If you have questions about this medicine, talk to your doctor, pharmacist, or health care provider.    2016, [employer]. (2013-07-12 14:19:57)

## 2015-02-16 ENCOUNTER — Ambulatory Visit (HOSPITAL_COMMUNITY): Payer: BLUE CROSS/BLUE SHIELD | Admitting: Physical Therapy

## 2015-02-18 ENCOUNTER — Ambulatory Visit (HOSPITAL_COMMUNITY): Payer: BLUE CROSS/BLUE SHIELD | Admitting: Physical Therapy

## 2015-02-23 ENCOUNTER — Ambulatory Visit (HOSPITAL_COMMUNITY): Payer: BLUE CROSS/BLUE SHIELD

## 2015-02-25 ENCOUNTER — Ambulatory Visit (HOSPITAL_COMMUNITY): Payer: BLUE CROSS/BLUE SHIELD

## 2015-02-25 ENCOUNTER — Other Ambulatory Visit: Payer: Self-pay

## 2015-02-25 DIAGNOSIS — Z1231 Encounter for screening mammogram for malignant neoplasm of breast: Secondary | ICD-10-CM

## 2015-03-23 ENCOUNTER — Ambulatory Visit
Admission: RE | Admit: 2015-03-23 | Discharge: 2015-03-23 | Disposition: A | Discharge status: Home / Self Care | Payer: BLUE CROSS/BLUE SHIELD | Source: Ambulatory Visit

## 2015-03-23 DIAGNOSIS — Z1231 Encounter for screening mammogram for malignant neoplasm of breast: Secondary | ICD-10-CM

## 2015-04-09 ENCOUNTER — Encounter (HOSPITAL_COMMUNITY): Payer: Self-pay | Admitting: Hematology & Oncology

## 2015-04-09 ENCOUNTER — Encounter (HOSPITAL_COMMUNITY): Payer: BLUE CROSS/BLUE SHIELD | Attending: Hematology & Oncology | Admitting: Hematology & Oncology

## 2015-04-09 ENCOUNTER — Encounter (HOSPITAL_COMMUNITY): Payer: BLUE CROSS/BLUE SHIELD

## 2015-04-09 VITALS — BP 115/57 | HR 67 | Temp 98.2°F | Resp 18 | Wt 158.0 lb

## 2015-04-09 DIAGNOSIS — D508 Other iron deficiency anemias: Secondary | ICD-10-CM

## 2015-04-09 DIAGNOSIS — M4626 Osteomyelitis of vertebra, lumbar region: Secondary | ICD-10-CM

## 2015-04-09 DIAGNOSIS — C9030 Solitary plasmacytoma not having achieved remission: Secondary | ICD-10-CM | POA: Diagnosis not present

## 2015-04-09 DIAGNOSIS — Z780 Asymptomatic menopausal state: Secondary | ICD-10-CM | POA: Diagnosis not present

## 2015-04-09 DIAGNOSIS — M810 Age-related osteoporosis without current pathological fracture: Secondary | ICD-10-CM

## 2015-04-09 DIAGNOSIS — G061 Intraspinal abscess and granuloma: Secondary | ICD-10-CM

## 2015-04-09 DIAGNOSIS — S329XXA Fracture of unspecified parts of lumbosacral spine and pelvis, initial encounter for closed fracture: Secondary | ICD-10-CM

## 2015-04-09 LAB — CBC WITH DIFFERENTIAL/PLATELET
Basophils Absolute: 0.1 10*3/uL (ref 0.0–0.1)
Basophils Relative: 1 %
Eosinophils Absolute: 0.1 10*3/uL (ref 0.0–0.7)
Eosinophils Relative: 3 %
HCT: 39.4 % (ref 36.0–46.0)
Hemoglobin: 13.1 g/dL (ref 12.0–15.0)
Lymphocytes Relative: 41 %
Lymphs Abs: 2 10*3/uL (ref 0.7–4.0)
MCH: 28.5 pg (ref 26.0–34.0)
MCHC: 33.2 g/dL (ref 30.0–36.0)
MCV: 85.7 fL (ref 78.0–100.0)
Monocytes Absolute: 0.3 10*3/uL (ref 0.1–1.0)
Monocytes Relative: 6 %
Neutro Abs: 2.4 10*3/uL (ref 1.7–7.7)
Neutrophils Relative %: 49 %
Platelets: 292 10*3/uL (ref 150–400)
RBC: 4.6 MIL/uL (ref 3.87–5.11)
RDW: 12.6 % (ref 11.5–15.5)
WBC: 4.9 10*3/uL (ref 4.0–10.5)

## 2015-04-09 LAB — COMPREHENSIVE METABOLIC PANEL
ALT: 14 U/L (ref 14–54)
AST: 18 U/L (ref 15–41)
Albumin: 4.1 g/dL (ref 3.5–5.0)
Alkaline Phosphatase: 87 U/L (ref 38–126)
Anion gap: 6 (ref 5–15)
BUN: 15 mg/dL (ref 6–20)
CO2: 28 mmol/L (ref 22–32)
Calcium: 9.6 mg/dL (ref 8.9–10.3)
Chloride: 108 mmol/L (ref 101–111)
Creatinine, Ser: 0.79 mg/dL (ref 0.44–1.00)
GFR calc Af Amer: >60 mL/min (ref >60)
GFR calc non Af Amer: >60 mL/min (ref >60)
Glucose, Bld: 82 mg/dL (ref 65–99)
Potassium: 4 mmol/L (ref 3.5–5.1)
Sodium: 142 mmol/L (ref 135–145)
Total Bilirubin: 0.6 mg/dL (ref 0.3–1.2)
Total Protein: 7 g/dL (ref 6.5–8.1)

## 2015-04-09 MED ORDER — HYDROCODONE-ACETAMINOPHEN 10-325 MG PO TABS: 1.0000 | ORAL_TABLET | Freq: Four times a day (QID) | ORAL | Status: DC | PRN

## 2015-04-09 NOTE — Patient Instructions (Signed)
..  [place] at [facility] Discharge Instructions  RECOMMENDATIONS MADE BY THE CONSULTANT AND ANY TEST RESULTS WILL BE SENT TO YOUR REFERRING PHYSICIAN.  Yearly Reclast We will see you in 4 months and we will do the same labs abain Myeloma survey in June on Return  Continue calcium and vitamin D  Thank you for choosing [place] at [facility] to provide your oncology and hematology care.  To afford each patient quality time with our provider, please arrive at least 15 minutes before your scheduled appointment time.   Beginning January 23rd 2017 lab work for the [facility] will be done in the  Main lab at [facility] on 1st floor. If you have a lab appointment with the [place] please come in thru the  Main Entrance and check in at the main information desk  You need to re-schedule your appointment should you arrive 10 or more minutes late.  We strive to give you quality time with our providers, and arriving late affects you and other patients whose appointments are after yours.  Also, if you no show three or more times for appointments you may be dismissed from the clinic at the providers discretion.     Again, thank you for choosing [facility].  Our hope is that these requests will decrease the amount of time that you wait before being seen by our physicians.       _____________________________________________________________  Should you have questions after your visit to [facility], please contact our office at [phone] between the hours of 8:30 a.m. and 4:30 p.m.  Voicemails left after 4:30 p.m. will not be returned until the following business day.  For prescription refill requests, have your pharmacy contact our office.

## 2015-04-09 NOTE — Progress Notes (Signed)
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 On 01/06/2014 she was seen by infectious disease who recommended continuing doxycycline therapy.  Osteomyelitis lumbar spine L2-L5 MRSA infection Relapsed infection 10/01/2014 8 weeks IV Vancomycin  Diagnosis Bone, biopsy, lumbar three - LAMELLAR BONE WITH ASSOCIATED FIBROUS TISSUE AND INCREASED PLASMA CELLS. - THERE IS NO EVIDENCE OF MALIGNANCY. - SEE COMMENT. Microscopic Comment Pending immunohistochemical stains. Diagnosis Note There are scattered plasma cells (approximately 5-10%), however, no aggregates are seen. Light chain immunohistochemistry is technically unsatisfactory. Pancytokeratin is negative. Correlation with clinical, radiographic, and laboratory data (SPEP, etc.) is recommended. Vicente Males MD Pathologist, Electronic Signature (Case signed 07/18/2013)  Hypercalcemia mild, normal PTH  Microcytic anemia, Colonoscopy and EGD 11/2011. History of multiple polyps (tubular adenomas, serrated adenomas, hyperplastic polyps)  No evidence of an M spike, normal immunoglobulin levels, normal Kappa/Lambda ratio  Bone marrow biopsy and aspirate on 03/26/2014 showing no diagnostic morphologic evidence of plasma cell neoplasm. Normocellular to slightly hypercellular marrow particles with maturing trilineage hematopoiesis, and mild nonspecific dyserythroipoiesis. No significant iron stores  Iron deficiency anemia, colonoscopy in 2013 with polyps and internal hemorrhoids, on recall in 2018. EGD in 2013 with irregular Z line and stricture. Pathology without evidence of  Barrett's  IV iron replacement 2/19, 2/26, 3/4   CURRENT THERAPY: Observation  INTERVAL HISTORY: Autumn Duran 65 y.o. 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  She has osteomyelitis and continues on doxycycline daily. She realistically has few complaints today. Her mood is good.   Autumn Duran returns to the [facility] today accompanied by her sister. She's in a wheelchair today because she recently fell and fractured her pelvic bone in two places.  She is still on doxycycline.She notes she had been doing very well until her fall. She notes she has significant pain since her fall. Prior to that she was feeling well.  She reports that she had her reclast infusion and did well. She denies any problems afterwards.  She has no nausea, vomiting, diarrhea or constipation. No significant change in her appetite.    MEDICAL HISTORY: Past Medical History  Diagnosis Date  . Diverticulosis   . Hemorrhoids   . Hx of adenomatous colonic polyps   . Hiatal hernia   . Hypertension   . Hyperlipidemia   . GERD (gastroesophageal reflux disease)   . Chronic kidney disease     kidney stone s/p stent placement ( removed)  . Complication of anesthesia   . PONV (postoperative nausea and vomiting)   . Bronchitis, chronic ([place])   . Chronic back pain   . Lumbar radicular pain   . Compression fracture of L3 lumbar vertebra (HCC)   . T7 vertebral fracture ([place])   . Smoldering myeloma ([place])   . Sore throat     has Benign neoplasm of colon; Esophageal reflux; Other dysphagia; Lumbar compression fracture ([place]); Compression fracture; Hypercalcemia; Hypokalemia; Sepsis ([place]); Epidural abscess, L2-L5; Right hip pain; 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[place]); Other iron deficiency anemias; Osteomyelitis of lumbar spine ([place]); Hypertension; Chronic back pain; Acute low back pain; Epidural abscess; Essential hypertension; MRSA infection; and Nausea with vomiting on her problem  list.     No history exists.      is allergic to morphine and related.  [name] had no medications administered during this visit.  SURGICAL HISTORY: Past Surgical History  Procedure Laterality Date  . Cholecystectomy    . Esophagogastroduodenoscopy  12/18/2011    Procedure: ESOPHAGOGASTRODUODENOSCOPY (EGD);  Surgeon: Lafayette Dragon, MD;  Location: [facility];  Service: Endoscopy;  Laterality: N/A;  . Colonoscopy  12/18/2011    Procedure: COLONOSCOPY;  Surgeon: Lafayette Dragon, MD;  Location: [facility];  Service: Endoscopy;  Laterality: N/A;  . Ureteral stent placement    . Vertebroplasty N/A 07/14/2013    Procedure: VERTEBROPLASTY WITH LUMBAR THREE BIOPSY;  Surgeon: Charlie Pitter, MD;  Location: [facility];  Service: Neurosurgery;  Laterality: N/A;  VERTEBROPLASTY WITH LUMBAR THREE BIOPSY  . Back surgery    . Lumbar laminectomy for epidural abscess Left 09/22/2013    Procedure: LUMBAR LAMINECTOMY FOR EPIDURAL ABSCESS LEFT LUMBAR FIVE-SACRAL ONE;  Surgeon: Charlie Pitter, MD;  Location: [facility];  Service: Neurosurgery;  Laterality: Left;  . Bone marrow biopsy Left 02/2014  . Bone marrow aspiration Left 02/2014  . Radiology with anesthesia N/A 08/13/2014    Procedure: RADIOLOGY WITH ANESTHESIA ;  Surgeon: Medication Radiologist, MD;  Location: [facility];  Service: Radiology;  Laterality: N/A;    SOCIAL HISTORY: Social History   Social History  . Marital Status: Single    Spouse Name: N/A  . Number of Children: N/A  . Years of Education: N/A   Occupational History  . Clerk     [employer]   Social History Main Topics  . Smoking status: Never Smoker   . Smokeless tobacco: Never Used  . Alcohol Use: 0.0 oz/week    0 Standard drinks or equivalent per week     Comment: social occ  . Drug Use: No  . Sexual Activity: Not on file   Other Topics Concern  . Not on file   Social History Narrative    FAMILY HISTORY: Family History  Problem Relation Age of Onset  . Breast cancer Sister 68  . Breast  cancer Other 34    aunt    Review of Systems  Constitutional: Negative for malaise/fatigue. Negative for fever, chills and weight loss.  HENT: Negative for congestion, hearing loss, nosebleeds, sore throat and tinnitus.   Eyes: Negative for blurred vision, double vision, pain and discharge.  Respiratory: Negative for cough, hemoptysis, sputum production, shortness of breath and wheezing.   Cardiovascular: Negative for chest pain, palpitations, claudication, leg swelling and PND.  Gastrointestinal: Negative for heartburn, nausea, vomiting, abdominal pain, diarrhea, constipation, blood in stool and melena.  Genitourinary: Negative for dysuria, urgency, frequency and hematuria.  Musculoskeletal: Positive for, hip pain,and pelvic pain.Negative for myalgias and falls. (SEE HPI)  Skin: Negative for itching and rash.  Neurological: Negative for dizziness, tingling, tremors, sensory change, speech change, focal weakness, seizures, loss of consciousness, weakness and headaches.  Endo/Heme/Allergies: Does not bruise/bleed easily.  Psychiatric/Behavioral: Negative for depression, suicidal ideas, memory loss and substance abuse. The patient is not nervous/anxious and does not have insomnia.   14 point review of systems was performed and is negative except as detailed under history of present illness and above   PHYSICAL EXAMINATION  ECOG PERFORMANCE STATUS: 1  Filed Vitals:   04/09/15 1353  BP: 115/57  Pulse: 67  Temp: 98.2 F (36.8 C)  Resp: 18    Physical Exam  Constitutional: She  is oriented to person, place, and time and well-developed, well-nourished, and in no distress. Well groomed HENT:  Head: Normocephalic and atraumatic.  Nose: Nose normal.  Mouth/Throat: Oropharynx is clear and moist. No oropharyngeal exudate.  Eyes: Conjunctivae and EOM are normal. Pupils are equal, round, and reactive to light. Right eye exhibits no discharge. Left eye exhibits no discharge. No scleral  icterus.  Neck: Normal range of motion. Neck supple. No tracheal deviation present. No thyromegaly present.  Cardiovascular: Normal rate, regular rhythm and normal heart sounds.  Exam reveals no gallop and no friction rub.   No murmur heard. Pulmonary/Chest: Effort normal and breath sounds normal. She has no wheezes. She has no rales.  Abdominal: Soft. Bowel sounds are normal. She exhibits no distension and no mass. There is no tenderness. There is no rebound and no guarding.  Musculoskeletal: She exhibits no edema.  Lymphadenopathy:    She has no cervical adenopathy.  Neurological: She is alert and oriented to person, place, and time. No cranial nerve deficit.  Skin: Skin is warm and dry. No rash noted.  Psychiatric: Mood, memory, affect and judgment normal.  Nursing note and vitals reviewed.   LABORATORY DATA: I have reviewed the labs below. Results for EVONY, REZEK (MRN [id number]) as of 05/01/2015 16:02  Ref. Range 04/09/2015 13:20 04/09/2015 13:20 04/09/2015 13:21  Sodium Latest Ref Range: 135-145 mmol/L 142    Potassium Latest Ref Range: 3.5-5.1 mmol/L 4.0    Chloride Latest Ref Range: 101-111 mmol/L 108    CO2 Latest Ref Range: 22-32 mmol/L 28    BUN Latest Ref Range: 6-20 mg/dL 15    Creatinine Latest Ref Range: 0.44-1.00 mg/dL 0.79    Calcium Latest Ref Range: 8.9-10.3 mg/dL 9.6    EGFR (Non-African Amer.) Latest Ref Range: >60 mL/min >60    EGFR (African American) Latest Ref Range: >60 mL/min >60    Glucose Latest Ref Range: 65-99 mg/dL 82    Anion gap Latest Ref Range: 5-15  6    Alkaline Phosphatase Latest Ref Range: 38-126 U/L 87    Albumin Latest Ref Range: 3.5-5.0 g/dL 4.1    AST Latest Ref Range: 15-41 U/L 18    ALT Latest Ref Range: 14-54 U/L 14    Total Protein Latest Ref Range: 6.5-8.1 g/dL 7.0    Total Bilirubin Latest Ref Range: 0.3-1.2 mg/dL 0.6    Beta-2 Microglobulin Latest Ref Range: 0.6-2.4 mg/L 2.2    Total Protein ELP Latest Ref Range: 6.0-8.5 g/dL  6.6  6.6  Albumin ELP Latest Ref Range: 2.9-4.4 g/dL   3.7  Globulin, Total Latest Ref Range: 2.2-3.9 g/dL   2.9  A/G Ratio Latest Ref Range: 0.7-1.7    1.3  Alpha-1-Globulin Latest Ref Range: 0.0-0.4 g/dL   0.3  Alpha-2-Globulin Latest Ref Range: 0.4-1.0 g/dL   0.6  Beta Globulin Latest Ref Range: 0.7-1.3 g/dL   1.0  Gamma Globulin Latest Ref Range: 0.4-1.8 g/dL   1.0  M-SPIKE, % Latest Ref Range: Not Observed g/dL   Not Observed  SPE Interp. Unknown   Comment  Comment Unknown   Comment  IgG (Immunoglobin G), Serum Latest Ref Range: [phone] mg/dL 1018 968   IgA Latest Ref Range: 87-352 mg/dL 190 200   IgM, Serum Latest Ref Range: 26-217 mg/dL 89 88   Kappa free light chain Latest Ref Range: 3.30-19.40 mg/L 20.38 (H)    Lamda free light chains Latest Ref Range: 5.71-26.30 mg/L 20.39    Kappa, lamda light chain ratio  Latest Ref Range: 0.26-1.65  1.00    WBC Latest Ref Range: 4.0-10.5 K/uL 4.9    RBC Latest Ref Range: 3.87-5.11 MIL/uL 4.60    Hemoglobin Latest Ref Range: 12.0-15.0 g/dL 13.1    HCT Latest Ref Range: 36.0-46.0 % 39.4    MCV Latest Ref Range: 78.0-100.0 fL 85.7    MCH Latest Ref Range: 26.0-34.0 pg 28.5    MCHC Latest Ref Range: 30.0-36.0 g/dL 33.2    RDW Latest Ref Range: 11.5-15.5 % 12.6    Platelets Latest Ref Range: 150-400 K/uL 292    Neutrophils Latest Units: % 49    Lymphocytes Latest Units: % 41    Monocytes Relative Latest Units: % 6    Eosinophil Latest Units: % 3    Basophil Latest Units: % 1    NEUT# Latest Ref Range: 1.7-7.7 K/uL 2.4    Lymphocyte # Latest Ref Range: 0.7-4.0 K/uL 2.0    Monocyte # Latest Ref Range: 0.1-1.0 K/uL 0.3    Eosinophils Absolute Latest Ref Range: 0.0-0.7 K/uL 0.1    Basophils Absolute Latest Ref Range: 0.0-0.1 K/uL 0.1     ASSESSMENT and THERAPY PLAN:   Plasmacytoma Normal bone marrow biopsy No M spike  She received radiation to the lumbar spine. Bone marrow biopsy has been performed showed no evidence of a plasma cell  neoplasm. Bone marrow biopsy has normal cytogenetics. Kappa lambda light chain ratio has been normal, SPEP/IEP has been normal. I recommended ongoing observation and monitoring of laboratory studies. We will see her back in 3 months.  She will need a myeloma survey at follow-up.  Iron deficiency anemia  Colonoscopy was performed in October 2013. 8 polyps were removed. Pathology was consistent with tubular adenomas, hyperplastic polyps, and 3 serrated adenomas. No dysplasia or malignancy was found. She is due for recall in 2018. We will check iron levels again in 3 months.  Chronic Back Pain Osteomyelitis Fall with pelvic fracture  She continues to follow with ID.   I will prescribe her some hydrocodone for incident pain.  Osteoporosis DEXA 12/14/2014  She has a hard time tolerating calcium pills. We discussed caltrate gummies as an easy to take calcium supplement and that calcium supplements would not worsen her kidney stones.  She will continue with yearly Reclast.   Orders Placed This Encounter  Procedures  . DG Bone Survey Met    Standing Status: Future     Number of Occurrences:      Standing Expiration Date: 06/09/2016    Order Specific Question:  Reason for Exam (SYMPTOM  OR DIAGNOSIS REQUIRED)    Answer:  plasmacytoma    Order Specific Question:  Preferred imaging location?    Answer:  [facility]   All questions were answered. The patient knows to call the clinic with any problems, questions or concerns. We can certainly see the patient much sooner if necessary.   This note was electronically signed by:  This document serves as a record of services personally performed by Ancil Linsey, MD. It was created on her behalf by Toni Amend, a trained medical scribe. The creation of this record is based on the scribe's personal observations and the provider's statements to them. This document has been checked and approved by the attending provider.  I have  reviewed the above documentation for accuracy and completeness, and I agree with the above.  Kelby Fam. [place]land, MD 04/09/2015

## 2015-04-10 LAB — BETA 2 MICROGLOBULIN, SERUM: Beta-2 Microglobulin: 2.2 mg/L (ref 0.6–2.4)

## 2015-04-10 LAB — IGG, IGA, IGM
IgA: 190 mg/dL (ref 87–352)
IgG (Immunoglobin G), Serum: 1018 mg/dL (ref 700–1600)
IgM, Serum: 89 mg/dL (ref 26–217)

## 2015-04-12 LAB — IMMUNOFIXATION ELECTROPHORESIS
IgA: 200 mg/dL (ref 87–352)
IgG (Immunoglobin G), Serum: 968 mg/dL (ref 700–1600)
IgM, Serum: 88 mg/dL (ref 26–217)
Total Protein ELP: 6.6 g/dL (ref 6.0–8.5)

## 2015-04-12 LAB — PROTEIN ELECTROPHORESIS, SERUM
A/G Ratio: 1.3 (ref 0.7–1.7)
Albumin ELP: 3.7 g/dL (ref 2.9–4.4)
Alpha-1-Globulin: 0.3 g/dL (ref 0.0–0.4)
Alpha-2-Globulin: 0.6 g/dL (ref 0.4–1.0)
Beta Globulin: 1 g/dL (ref 0.7–1.3)
Gamma Globulin: 1 g/dL (ref 0.4–1.8)
Globulin, Total: 2.9 g/dL (ref 2.2–3.9)
Total Protein ELP: 6.6 g/dL (ref 6.0–8.5)

## 2015-04-12 LAB — KAPPA/LAMBDA LIGHT CHAINS
Kappa free light chain: 20.38 mg/L — ABNORMAL HIGH (ref 3.30–19.40)
Kappa, lambda light chain ratio: 1 (ref 0.26–1.65)
Lambda free light chains: 20.39 mg/L (ref 5.71–26.30)

## 2015-05-25 ENCOUNTER — Ambulatory Visit: Payer: BLUE CROSS/BLUE SHIELD | Admitting: Internal Medicine

## 2015-06-14 ENCOUNTER — Ambulatory Visit (INDEPENDENT_AMBULATORY_CARE_PROVIDER_SITE_OTHER): Payer: BLUE CROSS/BLUE SHIELD | Admitting: Internal Medicine

## 2015-06-14 ENCOUNTER — Encounter: Payer: Self-pay | Admitting: Internal Medicine

## 2015-06-14 VITALS — BP 114/63 | HR 72 | Temp 97.7°F | Wt 159.0 lb

## 2015-06-14 DIAGNOSIS — R112 Nausea with vomiting, unspecified: Secondary | ICD-10-CM

## 2015-06-14 DIAGNOSIS — G061 Intraspinal abscess and granuloma: Secondary | ICD-10-CM

## 2015-06-14 MED ORDER — DOXYCYCLINE HYCLATE 100 MG PO TABS: 100.0000 mg | ORAL_TABLET | Freq: Every day | ORAL | Status: DC

## 2015-06-14 MED ORDER — PROMETHAZINE HCL 12.5 MG PO TABS: 12.5000 mg | ORAL_TABLET | Freq: Four times a day (QID) | ORAL | Status: DC | PRN

## 2015-06-14 NOTE — Progress Notes (Signed)
[facility] for Infectious Disease  Patient Active Problem List   Diagnosis Date Noted  . Right hip pain 12/23/2013    Priority: High  . Epidural abscess, L2-L5 11/04/2013    Priority: High  . Nausea with vomiting 10/12/2014  . MRSA infection   . Osteomyelitis of lumbar spine ([place]) 08/13/2014  . Acute low back pain 08/13/2014  . Epidural abscess 08/13/2014  . Hypertension   . Chronic back pain   . Essential hypertension   . Other iron deficiency anemias 04/10/2014  . 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[place]) 03/23/2014  . Sepsis ([place]) 09/21/2013  . Hypercalcemia 08/20/2013  . Hypokalemia 08/20/2013  . Lumbar compression fracture ([place]) 07/12/2013  . Compression fracture 07/12/2013  . Benign neoplasm of colon 12/18/2011  . Esophageal reflux 12/18/2011  . Other dysphagia 12/18/2011    Patient's Medications  New Prescriptions   No medications on file  Previous Medications   DIAZEPAM (VALIUM) 5 MG TABLET    Take 1 tablet (5 mg total) by mouth every 8 (eight) hours as needed for anxiety.   HYDROCODONE-ACETAMINOPHEN (NORCO) 10-325 MG TABLET    Take 1 tablet by mouth every 6 (six) hours as needed.   OXYCODONE (OXYCONTIN) 20 MG T12A 12 HR TABLET    Take 1 tablet (20 mg total) by mouth every 12 (twelve) hours.   RABEPRAZOLE (ACIPHEX) 20 MG TABLET    Take 1 tablet (20 mg total) by mouth daily.   ZOLEDRONIC ACID (RECLAST) 5 MG/100ML SOLN INJECTION    Inject 5 mg into the vein once.  Modified Medications   Modified Medication Previous Medication   DOXYCYCLINE (VIBRA-TABS) 100 MG TABLET doxycycline (VIBRA-TABS) 100 MG tablet      Take 1 tablet (100 mg total) by mouth daily.    Take 1 tablet (100 mg total) by mouth 2 (two) times daily.   PROMETHAZINE (PHENERGAN) 12.5 MG TABLET promethazine (PHENERGAN) 12.5 MG tablet      Take 1 tablet (12.5 mg total) by mouth every 6 (six) hours as needed for nausea or vomiting.    Take 1 tablet (12.5 mg total) by mouth every 6 (six) hours as needed for  nausea or vomiting.  Discontinued Medications   No medications on file    Subjective: Ms. Autumn Duran is in for her routine follow-up visit. She is now been back on antibiotic therapy for 9 months for her relapse to MRSA lumbar infection. She continues to have some mild nausea when she takes her doxycycline but this is relieved by taking a low-dose of promethazine before taking doxycycline. She had a follow-up MRI recently and saw Dr. Annette Stable last week. He recommended that she continue doxycycline indefinitely at a reduced dose of once daily. She fell when climbing her stairs in late January and suffered a pelvic fracture. She was on crutches and using her walker for a period of time but is now feeling much better and back to being independent. She continues to take her OxyContin twice daily.  Review of Systems: Review of Systems  Constitutional: Negative for fever, chills, weight loss, malaise/fatigue and diaphoresis.  HENT: Negative for sore throat.   Respiratory: Negative for cough, sputum production and shortness of breath.   Cardiovascular: Negative for chest pain.  Gastrointestinal: Positive for constipation. Negative for nausea, vomiting, abdominal pain and diarrhea.  Musculoskeletal: Positive for back pain. Negative for myalgias and joint pain.  Skin: Negative for rash.  Neurological: Negative for headaches.    Past Medical  History  Diagnosis Date  . Diverticulosis   . Hemorrhoids   . Hx of adenomatous colonic polyps   . Hiatal hernia   . Hypertension   . Hyperlipidemia   . GERD (gastroesophageal reflux disease)   . Chronic kidney disease     kidney stone s/p stent placement ( removed)  . Complication of anesthesia   . PONV (postoperative nausea and vomiting)   . Bronchitis, chronic ([place])   . Chronic back pain   . Lumbar radicular pain   . Compression fracture of L3 lumbar vertebra (HCC)   . T7 vertebral fracture ([place])   . Smoldering myeloma ([place])   . Sore throat     Social  History  Substance Use Topics  . Smoking status: Never Smoker   . Smokeless tobacco: Never Used  . Alcohol Use: 0.0 oz/week    0 Standard drinks or equivalent per week     Comment: social occ    Family History  Problem Relation Age of Onset  . Breast cancer Sister 47  . Breast cancer Other 18    aunt    Allergies  Allergen Reactions  . Morphine And Related Other (See Comments)    Headache    Objective: Filed Vitals:   06/14/15 1358  BP: 114/63  Pulse: 72  Temp: 97.7 F (36.5 C)  TempSrc: Oral  Weight: 159 lb (72.122 kg)   Body mass index is 29.56 kg/(m^2).  Physical Exam  Constitutional: She is oriented to person, place, and time.  She is smiling and in good spirits.  Eyes: Conjunctivae are normal.  Neurological: She is alert and oriented to person, place, and time. Gait normal.    Lab Results SED RATE (mm/hr)  Date Value  12/10/2014 6  08/17/2014 75*  07/14/2014 27   CRP (mg/dL)  Date Value  12/10/2014 <0.5  08/17/2014 4.5*  07/14/2014 <0.5   Lumbar MRI with and without contrast 06/03/2015  Impression  Discitis and osteomyelitis of L2-3 shows mild interval improvement. No abscess identified. Improvement in bone marrow edema and enhancement involving the left facet and posterior elements of L5-S1 which may be due to treatment of infection or inflammation since prior study. Interval nondisplaced fracture right sacrum related to trauma.  Dr. Barnet Glasgow   Problem List Items Addressed This Visit      High   Epidural abscess, L2-L5 - Primary    She has had severe MRSA infection at the site of her lumbar plasmacytoma. She relapsed last year but has responded well to therapy. She has tolerated doxycycline well except for mild nausea that is controllable with promethazine. I talked her about the uncertainty as to what is the best course for her but I agree with Dr. Annette Stable that it is reasonable to transition her to once daily, lifelong, suppressive doxycycline  therapy. I suggested that she take it in the evening so that she is not bothered by daytime drowsiness caused by promethazine. She will follow-up with me in 6 months.      Relevant Medications   doxycycline (VIBRA-TABS) 100 MG tablet    Other Visit Diagnoses    Nausea and vomiting, vomiting of unspecified type        Relevant Medications    promethazine (PHENERGAN) 12.5 MG tablet        Michel Bickers, MD [facility] for [place] [phone] pager   [phone] cell 06/14/2015, 2:32 PM

## 2015-06-14 NOTE — Assessment & Plan Note (Signed)
She has had severe MRSA infection at the site of her lumbar plasmacytoma. She relapsed last year but has responded well to therapy. She has tolerated doxycycline well except for mild nausea that is controllable with promethazine. I talked her about the uncertainty as to what is the best course for her but I agree with Dr. Annette Stable that it is reasonable to transition her to once daily, lifelong, suppressive doxycycline therapy. I suggested that she take it in the evening so that she is not bothered by daytime drowsiness caused by promethazine. She will follow-up with me in 6 months.

## 2015-08-10 ENCOUNTER — Encounter (HOSPITAL_COMMUNITY): Payer: BLUE CROSS/BLUE SHIELD

## 2015-08-10 ENCOUNTER — Encounter (HOSPITAL_COMMUNITY): Payer: BLUE CROSS/BLUE SHIELD | Attending: Hematology & Oncology | Admitting: Hematology & Oncology

## 2015-08-10 ENCOUNTER — Encounter (HOSPITAL_COMMUNITY): Payer: Self-pay | Admitting: Hematology & Oncology

## 2015-08-10 VITALS — BP 138/66 | HR 69 | Temp 98.5°F | Resp 18 | Wt 156.7 lb

## 2015-08-10 DIAGNOSIS — K449 Diaphragmatic hernia without obstruction or gangrene: Secondary | ICD-10-CM | POA: Insufficient documentation

## 2015-08-10 DIAGNOSIS — G8929 Other chronic pain: Secondary | ICD-10-CM | POA: Diagnosis not present

## 2015-08-10 DIAGNOSIS — N189 Chronic kidney disease, unspecified: Secondary | ICD-10-CM | POA: Insufficient documentation

## 2015-08-10 DIAGNOSIS — M810 Age-related osteoporosis without current pathological fracture: Secondary | ICD-10-CM | POA: Diagnosis not present

## 2015-08-10 DIAGNOSIS — K219 Gastro-esophageal reflux disease without esophagitis: Secondary | ICD-10-CM | POA: Diagnosis not present

## 2015-08-10 DIAGNOSIS — I129 Hypertensive chronic kidney disease with stage 1 through stage 4 chronic kidney disease, or unspecified chronic kidney disease: Secondary | ICD-10-CM | POA: Diagnosis not present

## 2015-08-10 DIAGNOSIS — J3489 Other specified disorders of nose and nasal sinuses: Secondary | ICD-10-CM

## 2015-08-10 DIAGNOSIS — J42 Unspecified chronic bronchitis: Secondary | ICD-10-CM | POA: Insufficient documentation

## 2015-08-10 DIAGNOSIS — G4452 New daily persistent headache (NDPH): Secondary | ICD-10-CM | POA: Diagnosis not present

## 2015-08-10 DIAGNOSIS — Z9889 Other specified postprocedural states: Secondary | ICD-10-CM | POA: Insufficient documentation

## 2015-08-10 DIAGNOSIS — C9030 Solitary plasmacytoma not having achieved remission: Secondary | ICD-10-CM | POA: Diagnosis not present

## 2015-08-10 DIAGNOSIS — E785 Hyperlipidemia, unspecified: Secondary | ICD-10-CM | POA: Diagnosis not present

## 2015-08-10 DIAGNOSIS — K648 Other hemorrhoids: Secondary | ICD-10-CM | POA: Insufficient documentation

## 2015-08-10 DIAGNOSIS — D509 Iron deficiency anemia, unspecified: Secondary | ICD-10-CM

## 2015-08-10 DIAGNOSIS — D508 Other iron deficiency anemias: Secondary | ICD-10-CM

## 2015-08-10 LAB — CBC WITH DIFFERENTIAL/PLATELET
Basophils Absolute: 0 10*3/uL (ref 0.0–0.1)
Basophils Relative: 1 %
Eosinophils Absolute: 0.2 10*3/uL (ref 0.0–0.7)
Eosinophils Relative: 3 %
HCT: 41.5 % (ref 36.0–46.0)
Hemoglobin: 13.6 g/dL (ref 12.0–15.0)
Lymphocytes Relative: 24 %
Lymphs Abs: 1.4 10*3/uL (ref 0.7–4.0)
MCH: 28.1 pg (ref 26.0–34.0)
MCHC: 32.8 g/dL (ref 30.0–36.0)
MCV: 85.7 fL (ref 78.0–100.0)
Monocytes Absolute: 0.4 10*3/uL (ref 0.1–1.0)
Monocytes Relative: 6 %
Neutro Abs: 4 10*3/uL (ref 1.7–7.7)
Neutrophils Relative %: 66 %
Platelets: 242 10*3/uL (ref 150–400)
RBC: 4.84 MIL/uL (ref 3.87–5.11)
RDW: 14.2 % (ref 11.5–15.5)
WBC: 6 10*3/uL (ref 4.0–10.5)

## 2015-08-10 LAB — COMPREHENSIVE METABOLIC PANEL
ALT: 14 U/L (ref 14–54)
AST: 18 U/L (ref 15–41)
Albumin: 4.1 g/dL (ref 3.5–5.0)
Alkaline Phosphatase: 59 U/L (ref 38–126)
Anion gap: 5 (ref 5–15)
BUN: 17 mg/dL (ref 6–20)
CO2: 26 mmol/L (ref 22–32)
Calcium: 9.8 mg/dL (ref 8.9–10.3)
Chloride: 107 mmol/L (ref 101–111)
Creatinine, Ser: 0.74 mg/dL (ref 0.44–1.00)
GFR calc Af Amer: >60 mL/min (ref >60)
GFR calc non Af Amer: >60 mL/min (ref >60)
Glucose, Bld: 96 mg/dL (ref 65–99)
Potassium: 3.8 mmol/L (ref 3.5–5.1)
Sodium: 138 mmol/L (ref 135–145)
Total Bilirubin: 0.9 mg/dL (ref 0.3–1.2)
Total Protein: 7 g/dL (ref 6.5–8.1)

## 2015-08-10 MED ORDER — IBUPROFEN 800 MG PO TABS: 800.0000 mg | ORAL_TABLET | Freq: Three times a day (TID) | ORAL | Status: DC | PRN

## 2015-08-10 NOTE — Patient Instructions (Signed)
[place] at [facility] Discharge Instructions  RECOMMENDATIONS MADE BY THE CONSULTANT AND ANY TEST RESULTS WILL BE SENT TO YOUR REFERRING PHYSICIAN.  Return to clinic in 6 months with labs  Myeloma Survey in [place] Thursday or next week  Thank you for choosing [facility] at [facility] to provide your oncology and hematology care.  To afford each patient quality time with our provider, please arrive at least 15 minutes before your scheduled appointment time.   Beginning January 23rd 2017 lab work for the [facility] will be done in the  Main lab at [facility] on 1st floor. If you have a lab appointment with the [facility] please come in thru the  Main Entrance and check in at the main information desk  You need to re-schedule your appointment should you arrive 10 or more minutes late.  We strive to give you quality time with our providers, and arriving late affects you and other patients whose appointments are after yours.  Also, if you no show three or more times for appointments you may be dismissed from the clinic at the providers discretion.     Again, thank you for choosing [facility].  Our hope is that these requests will decrease the amount of time that you wait before being seen by our physicians.       _____________________________________________________________  Should you have questions after your visit to [facility], please contact our office at [phone] between the hours of 8:30 a.m. and 4:30 p.m.  Voicemails left after 4:30 p.m. will not be returned until the following business day.  For prescription refill requests, have your pharmacy contact our office.         Resources For Cancer Patients and their Caregivers ? American Cancer Society: Can assist with transportation, wigs, general needs, runs Look Good Feel Better.        657-066-1710 ? Cancer Care: Provides financial assistance,  online support groups, medication/co-pay assistance.  1-800-813-HOPE (401)318-0468) ? [facility] Assists [place] cancer patients and their families through emotional , educational and financial support.  (314) 041-0737 ? [place] DSS Where to apply for food stamps, Medicaid and utility assistance. 805-304-9741 ? [facility]: Transportation to medical appointments. 812-178-7942 ? Social Security Administration: May apply for disability if have a Stage IV cancer. 512-738-9113 340-426-3700 ? [employer], Disability and Transit Services: Assists with nutrition, care and transit needs. [place] Support Programs: '@10RELATIVEDAYS'$ @ > Cancer Support Group  2nd Tuesday of the month 1pm-2pm, Journey Room  > Creative Journey  3rd Tuesday of the month 1130am-1pm, Journey Room  > Look Good Feel Better  1st Wednesday of the month 10am-12 noon, Journey Room (Call [facility] to register [phone])

## 2015-08-10 NOTE — Progress Notes (Signed)
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 On 01/06/2014 she was seen by infectious disease who recommended continuing doxycycline therapy.  Osteomyelitis lumbar spine L2-L5 MRSA infection Relapsed infection 10/01/2014 8 weeks IV Vancomycin  Diagnosis Bone, biopsy, lumbar three - LAMELLAR BONE WITH ASSOCIATED FIBROUS TISSUE AND INCREASED PLASMA CELLS. - THERE IS NO EVIDENCE OF MALIGNANCY. - SEE COMMENT. Microscopic Comment Pending immunohistochemical stains. Diagnosis Note There are scattered plasma cells (approximately 5-10%), however, no aggregates are seen. Light chain immunohistochemistry is technically unsatisfactory. Pancytokeratin is negative. Correlation with clinical, radiographic, and laboratory data (SPEP, etc.) is recommended. Autumn Males MD Pathologist, Electronic Signature (Case signed 07/18/2013)  Hypercalcemia mild, normal PTH  Microcytic anemia, Colonoscopy and EGD 11/2011. History of multiple polyps (tubular adenomas, serrated adenomas, hyperplastic polyps)  No evidence of an M spike, normal immunoglobulin levels, normal Kappa/Lambda ratio  Bone marrow biopsy and aspirate on 03/26/2014 showing no diagnostic morphologic evidence of plasma cell neoplasm. Normocellular to slightly hypercellular marrow particles with maturing trilineage hematopoiesis, and mild nonspecific dyserythroipoiesis. No significant iron stores  Iron deficiency anemia, colonoscopy in 2013 with polyps and internal hemorrhoids, on recall in 2018. EGD in 2013 with irregular Z line and stricture. Pathology without evidence of  Barrett's  IV iron replacement 2/19, 2/26, 3/4   CURRENT THERAPY: Observation  INTERVAL HISTORY: Autumn Duran 65 y.o. 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  She has osteomyelitis and continues on doxycycline daily. She realistically has few complaints today. Her mood is good.   [name] returns to the [place] today accompanied by her sister. They are both very tanned from visiting the beach.  Independent, healed from her fracture and her fall, doing her own thing again.  She says she's been experiencing a "nonstop headache" recently.She notes that she's on oxycontin twice a day, and doesn't take hydrocodone anymore. However she complains that this headache is "constant," maybe for a month. She says "I didn't pay much attention to it until recently, when I was at the beach trying to relax and still had this nagging headache." When asked if her headache may be a result of allergies, she says she constantly has congestion, and acknowledges that she hopes it's allergy or sinus related.  She notes that her appetite is very good. She was just at [facility] for a whole week, sitting out in the sun and eating all kinds of food.  She notes that she's good on her last mammogram, but she hasn't had a colonoscopy for a while since her last one. She believes however she is up todate. Last C-scope was in [place].   No other major concerns energy is good. Appetite is excellent.   MEDICAL HISTORY: Past Medical History  Diagnosis Date  . Diverticulosis   . Hemorrhoids   . Hx of adenomatous colonic polyps   . Hiatal hernia   . Hypertension   . Hyperlipidemia   . GERD (gastroesophageal reflux disease)   . Chronic kidney disease     kidney stone s/p stent placement ( removed)  . Complication of anesthesia   . PONV (postoperative nausea and vomiting)   .  Bronchitis, chronic ([place])   . Chronic back pain   . Lumbar radicular pain   . Compression fracture of L3 lumbar vertebra ([facility])    . T7 vertebral fracture ([place])   . Smoldering myeloma ([place])   . Sore throat     has Benign neoplasm of colon; Esophageal reflux; Other dysphagia; Lumbar compression fracture ([place]); Compression fracture; Hypercalcemia; Hypokalemia; Sepsis ([place]); Epidural abscess, L2-L5; Right hip pain; 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[place]); Other iron deficiency anemias; Osteomyelitis of lumbar spine ([place]); Hypertension; Chronic back pain; Acute low back pain; Epidural abscess; Essential hypertension; MRSA infection; and Nausea with vomiting on her problem list.     is allergic to morphine and related.  [name] does not currently have medications on file.  SURGICAL HISTORY: Past Surgical History  Procedure Laterality Date  . Cholecystectomy    . Esophagogastroduodenoscopy  12/18/2011    Procedure: ESOPHAGOGASTRODUODENOSCOPY (EGD);  Surgeon: Lafayette Dragon, MD;  Location: [facility];  Service: Endoscopy;  Laterality: N/A;  . Colonoscopy  12/18/2011    Procedure: COLONOSCOPY;  Surgeon: Lafayette Dragon, MD;  Location: [facility];  Service: Endoscopy;  Laterality: N/A;  . Ureteral stent placement    . Vertebroplasty N/A 07/14/2013    Procedure: VERTEBROPLASTY WITH LUMBAR THREE BIOPSY;  Surgeon: Charlie Pitter, MD;  Location: [facility];  Service: Neurosurgery;  Laterality: N/A;  VERTEBROPLASTY WITH LUMBAR THREE BIOPSY  . Back surgery    . Lumbar laminectomy for epidural abscess Left 09/22/2013    Procedure: LUMBAR LAMINECTOMY FOR EPIDURAL ABSCESS LEFT LUMBAR FIVE-SACRAL ONE;  Surgeon: Charlie Pitter, MD;  Location: [facility];  Service: Neurosurgery;  Laterality: Left;  . Bone marrow biopsy Left 02/2014  . Bone marrow aspiration Left 02/2014  . Radiology with anesthesia N/A 08/13/2014    Procedure: RADIOLOGY WITH ANESTHESIA ;  Surgeon: Medication Radiologist, MD;  Location: [facility];  Service: Radiology;  Laterality: N/A;    SOCIAL HISTORY: Social History   Social History  . Marital Status: Single    Spouse  Name: N/A  . Number of Children: N/A  . Years of Education: N/A   Occupational History  . Clerk     [employer]   Social History Main Topics  . Smoking status: Never Smoker   . Smokeless tobacco: Never Used  . Alcohol Use: 0.0 oz/week    0 Standard drinks or equivalent per week     Comment: social occ  . Drug Use: No  . Sexual Activity: Not on file   Other Topics Concern  . Not on file   Social History Narrative    FAMILY HISTORY: Family History  Problem Relation Age of Onset  . Breast cancer Sister 21  . Breast cancer Other 61    aunt    Review of Systems  Constitutional: Negative for malaise/fatigue. Negative for fever, chills and weight loss.  HENT: Negative for congestion, hearing loss, nosebleeds, sore throat and tinnitus.   Eyes: Negative for blurred vision, double vision, pain and discharge.  Respiratory: Negative for cough, hemoptysis, sputum production, shortness of breath and wheezing.   Cardiovascular: Negative for chest pain, palpitations, claudication, leg swelling and PND.  Gastrointestinal: Negative for heartburn, nausea, vomiting, abdominal pain, diarrhea, constipation, blood in stool and melena.  Genitourinary: Negative for dysuria, urgency, frequency and hematuria.  Musculoskeletal: Negative for pain.Negative for myalgias and falls. Skin: Negative for itching and rash.  Neurological: Negative for dizziness, tingling, tremors, sensory change, speech change, focal weakness, seizures, loss of  consciousness, weakness Positive for headache Endo/Heme/Allergies: Does not bruise/bleed easily.  Psychiatric/Behavioral: Negative for depression, suicidal ideas, memory loss and substance abuse. The patient is not nervous/anxious and does not have insomnia.   14 point review of systems was performed and is negative except as detailed under history of present illness and above    PHYSICAL EXAMINATION  ECOG PERFORMANCE STATUS: 1  Filed Vitals:   08/10/15 1128   BP: 138/66  Pulse: 69  Temp: 98.5 F (36.9 C)  Resp: 18    Physical Exam  Constitutional: She is oriented to person, place, and time and well-developed, well-nourished, and in no distress. Well groomed HENT:  Head: Normocephalic and atraumatic.  Nose: Nose normal.  Mouth/Throat: Oropharynx is clear and moist. No oropharyngeal exudate.  Eyes: Conjunctivae and EOM are normal. Pupils are equal, round, and reactive to light. Right eye exhibits no discharge. Left eye exhibits no discharge. No scleral icterus.  Neck: Normal range of motion. Neck supple. No tracheal deviation present. No thyromegaly present.  Cardiovascular: Normal rate, regular rhythm and normal heart sounds.  Exam reveals no gallop and no friction rub.   No murmur heard. Pulmonary/Chest: Effort normal and breath sounds normal. She has no wheezes. She has no rales.  Abdominal: Soft. Bowel sounds are normal. She exhibits no distension and no mass. There is no tenderness. There is no rebound and no guarding.  Musculoskeletal: She exhibits no edema.  Lymphadenopathy:    She has no cervical adenopathy.  Neurological: She is alert and oriented to person, place, and time. No cranial nerve deficit.  Skin: Skin is warm and dry. No rash noted.  Psychiatric: Mood, memory, affect and judgment normal.  Nursing note and vitals reviewed.   LABORATORY DATA: I have reviewed the labs below.  CBC    Component Value Date/Time   WBC 6.0 08/10/2015 1054   WBC 5.3 03/26/2014 0914   RBC 4.84 08/10/2015 1054   RBC 4.54 05/08/2014 1310   RBC 4.50 03/26/2014 0914   HGB 13.6 08/10/2015 1054   HGB 9.9* 03/26/2014 0914   HCT 41.5 08/10/2015 1054   HCT 32.5* 03/26/2014 0914   PLT 242 08/10/2015 1054   PLT 318 03/26/2014 0914   MCV 85.7 08/10/2015 1054   MCV 72* 03/26/2014 0914   MCH 28.1 08/10/2015 1054   MCH 22.0* 03/26/2014 0914   MCHC 32.8 08/10/2015 1054   MCHC 30.4* 03/26/2014 0914   RDW 14.2 08/10/2015 1054   RDW 17.1*  03/26/2014 0914   LYMPHSABS 1.4 08/10/2015 1054   LYMPHSABS 1.4 03/26/2014 0914   MONOABS 0.4 08/10/2015 1054   MONOABS 0.5 03/26/2014 0914   EOSABS 0.2 08/10/2015 1054   EOSABS 0.2 03/26/2014 0914   BASOSABS 0.0 08/10/2015 1054   BASOSABS 0.1 03/26/2014 0914   CMP     Component Value Date/Time   NA 138 08/10/2015 1054   K 3.8 08/10/2015 1054   CL 107 08/10/2015 1054   CO2 26 08/10/2015 1054   GLUCOSE 96 08/10/2015 1054   BUN 17 08/10/2015 1054   CREATININE 0.74 08/10/2015 1054   CALCIUM 9.8 08/10/2015 1054   CALCIUM 10.4* 03/19/2014 1005   PROT 7.0 08/10/2015 1054   ALBUMIN 4.1 08/10/2015 1054   AST 18 08/10/2015 1054   ALT 14 08/10/2015 1054   ALKPHOS 59 08/10/2015 1054   BILITOT 0.9 08/10/2015 1054   GFRNONAA >60 08/10/2015 1054   GFRAA >60 08/10/2015 1054    ASSESSMENT and THERAPY PLAN:   Plasmacytoma Normal bone marrow  biopsy No M spike  She received radiation to the lumbar spine. Bone marrow biopsy has been performed showed no evidence of a plasma cell neoplasm. Bone marrow biopsy has normal cytogenetics. Kappa lambda light chain ratio has been normal, SPEP/IEP has been normal. I recommended ongoing observation and monitoring of laboratory studies. We will see her back in 6 months.  She will need a myeloma survey at follow-up. This has been ordered. She was due at her last visit but did not do secondary to her pelvic fracture.  Iron deficiency anemia  Colonoscopy was performed in October 2013. 8 polyps were removed. Pathology was consistent with tubular adenomas, hyperplastic polyps, and 3 serrated adenomas. No dysplasia or malignancy was found. She is due for recall in 2018. CBC is WNL. Will recheck ferritin at her next follow-up  Chronic Back Pain Osteomyelitis Fall with pelvic fracture  She continues to follow with ID. She will continue doxycycline as prescribed.  Osteoporosis DEXA 12/14/2014  She has a hard time tolerating calcium pills. We discussed  caltrate gummies as an easy to take calcium supplement and that calcium supplements would not worsen her kidney stones.  She will continue with yearly Reclast. She is due again in December.   Headaches  I have ordered a CT of the sinuses given her sinus symptoms and headaches. I will keep her apprised of the results once available. Additional recommendations will be made at that time.   Orders Placed This Encounter  Procedures  . CT Maxillofacial W/Cm    Standing Status: Future     Number of Occurrences:      Standing Expiration Date: 11/09/2016    Order Specific Question:  If indicated for the ordered procedure, I authorize the administration of contrast media per Radiology protocol    Answer:  Yes    Order Specific Question:  Reason for Exam (SYMPTOM  OR DIAGNOSIS REQUIRED)    Answer:  persistent frontal headache, sinus pressure    Order Specific Question:  Preferred imaging location?    Answer:  [facility]    Standing Status: Future     Number of Occurrences:      Standing Expiration Date: 10/09/2016    Order Specific Question:  Reason for Exam (SYMPTOM  OR DIAGNOSIS REQUIRED)    Answer:  plasmacytoma    Order Specific Question:  Preferred imaging location?    Answer:  [facility]  . Comprehensive metabolic panel    Standing Status: Future     Number of Occurrences:      Standing Expiration Date: 08/09/2016  . CBC with Differential/Platelet    Standing Status: Future     Number of Occurrences:      Standing Expiration Date: 08/09/2016   All questions were answered. The patient knows to call the clinic with any problems, questions or concerns. We can certainly see the patient much sooner if necessary.   This note was electronically signed by:  This document serves as a record of services personally performed by Ancil Linsey, MD. It was created on her behalf by Toni Amend, a trained medical scribe. The creation of this record is  based on the scribe's personal observations and the provider's statements to them. This document has been checked and approved by the attending provider.  I have reviewed the above documentation for accuracy and completeness, and I agree with the above.  Kelby Fam. Burrell Hodapp, MD 08/10/2015

## 2015-08-11 LAB — PROTEIN ELECTROPHORESIS, SERUM
A/G Ratio: 1.3 (ref 0.7–1.7)
Albumin ELP: 3.8 g/dL (ref 2.9–4.4)
Alpha-1-Globulin: 0.2 g/dL (ref 0.0–0.4)
Alpha-2-Globulin: 0.6 g/dL (ref 0.4–1.0)
Beta Globulin: 1 g/dL (ref 0.7–1.3)
Gamma Globulin: 1.1 g/dL (ref 0.4–1.8)
Globulin, Total: 2.9 g/dL (ref 2.2–3.9)
Total Protein ELP: 6.7 g/dL (ref 6.0–8.5)

## 2015-08-11 LAB — IMMUNOFIXATION ELECTROPHORESIS
IgA: 187 mg/dL (ref 87–352)
IgG (Immunoglobin G), Serum: 1011 mg/dL (ref 700–1600)
IgM, Serum: 86 mg/dL (ref 26–217)
Total Protein ELP: 6.7 g/dL (ref 6.0–8.5)

## 2015-08-11 LAB — IGG, IGA, IGM
IgA: 196 mg/dL (ref 87–352)
IgG (Immunoglobin G), Serum: 994 mg/dL (ref 700–1600)
IgM, Serum: 86 mg/dL (ref 26–217)

## 2015-08-11 LAB — KAPPA/LAMBDA LIGHT CHAINS
Kappa free light chain: 17.8 mg/L (ref 3.3–19.4)
Kappa, lambda light chain ratio: 0.98 (ref 0.26–1.65)
Lambda free light chains: 18.1 mg/L (ref 5.7–26.3)

## 2015-08-11 LAB — BETA 2 MICROGLOBULIN, SERUM: Beta-2 Microglobulin: 2 mg/L (ref 0.6–2.4)

## 2015-08-18 ENCOUNTER — Ambulatory Visit (HOSPITAL_COMMUNITY)
Admission: RE | Admit: 2015-08-18 | Discharge: 2015-08-18 | Disposition: A | Discharge status: Home / Self Care | Payer: BLUE CROSS/BLUE SHIELD | Source: Ambulatory Visit | Attending: Hematology & Oncology | Admitting: Hematology & Oncology

## 2015-08-18 DIAGNOSIS — J3489 Other specified disorders of nose and nasal sinuses: Secondary | ICD-10-CM | POA: Insufficient documentation

## 2015-08-18 DIAGNOSIS — J019 Acute sinusitis, unspecified: Secondary | ICD-10-CM | POA: Diagnosis not present

## 2015-08-18 MED ORDER — IOPAMIDOL (ISOVUE-370) INJECTION 76%
75.0000 mL | Freq: Once | INTRAVENOUS | Status: AC | PRN
  Administered 2015-08-18: 75 mL via INTRAVENOUS

## 2015-08-25 ENCOUNTER — Encounter: Payer: Self-pay | Admitting: Internal Medicine

## 2015-10-21 ENCOUNTER — Ambulatory Visit (INDEPENDENT_AMBULATORY_CARE_PROVIDER_SITE_OTHER): Payer: BLUE CROSS/BLUE SHIELD | Admitting: Otolaryngology

## 2015-10-21 DIAGNOSIS — J343 Hypertrophy of nasal turbinates: Secondary | ICD-10-CM | POA: Diagnosis not present

## 2015-10-21 DIAGNOSIS — J342 Deviated nasal septum: Secondary | ICD-10-CM

## 2015-10-21 DIAGNOSIS — J320 Chronic maxillary sinusitis: Secondary | ICD-10-CM | POA: Diagnosis not present

## 2015-11-18 ENCOUNTER — Ambulatory Visit (INDEPENDENT_AMBULATORY_CARE_PROVIDER_SITE_OTHER): Payer: BLUE CROSS/BLUE SHIELD | Admitting: Otolaryngology

## 2015-11-18 DIAGNOSIS — J342 Deviated nasal septum: Secondary | ICD-10-CM | POA: Diagnosis not present

## 2015-11-18 DIAGNOSIS — J343 Hypertrophy of nasal turbinates: Secondary | ICD-10-CM

## 2015-11-18 DIAGNOSIS — J330 Polyp of nasal cavity: Secondary | ICD-10-CM | POA: Diagnosis not present

## 2016-02-14 ENCOUNTER — Other Ambulatory Visit (HOSPITAL_COMMUNITY): Payer: Self-pay | Admitting: Hematology & Oncology

## 2016-02-14 NOTE — Progress Notes (Signed)
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 On 01/06/2014 she was seen by infectious disease who recommended continuing doxycycline therapy.  Osteomyelitis lumbar spine L2-L5 MRSA infection Relapsed infection 10/01/2014 8 weeks IV Vancomycin  Diagnosis Bone, biopsy, lumbar three - LAMELLAR BONE WITH ASSOCIATED FIBROUS TISSUE AND INCREASED PLASMA CELLS. - THERE IS NO EVIDENCE OF MALIGNANCY. - SEE COMMENT. Microscopic Comment Pending immunohistochemical stains. Diagnosis Note There are scattered plasma cells (approximately 5-10%), however, no aggregates are seen. Light chain immunohistochemistry is technically unsatisfactory. Pancytokeratin is negative. Correlation with clinical, radiographic, and laboratory data (SPEP, etc.) is recommended. Vicente Males MD Pathologist, Electronic Signature (Case signed 07/18/2013)  Hypercalcemia mild, normal PTH  Microcytic anemia, Colonoscopy and EGD 11/2011. History of multiple polyps (tubular adenomas, serrated adenomas, hyperplastic polyps)  No evidence of an M spike, normal immunoglobulin levels, normal Kappa/Lambda ratio  Bone marrow biopsy and aspirate on 03/26/2014 showing no diagnostic morphologic evidence of plasma cell neoplasm. Normocellular to slightly hypercellular marrow particles with maturing trilineage hematopoiesis, and mild nonspecific dyserythroipoiesis. No significant iron stores  Iron deficiency anemia, colonoscopy in 2013 with polyps and internal hemorrhoids, on recall in 2018. EGD in 2013 with irregular Z line and stricture. Pathology  without evidence of Barrett's  IV iron replacement 2/19, 2/26, 3/4   CURRENT THERAPY: Observation  INTERVAL HISTORY: Autumn Duran 65 y.o. 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  She had osteomyelitis and continues on doxycycline daily, which she will continue for life per Infectious Diseases.   Autumn Duran was seen today while she was getting her annual Reclast infusion.  At her last visit with Dr. Whitney Muse 6 months ago, she was having significant headaches and sinus issues.  Her headaches have improved. She saw "a sinus doctor" who recently put her on a prednisone dose pack & allergy medications. She isn't sure they are helping her. She has completed the steroids with some improvement, but not resolution of her symptoms.    She denies any new bone pains. She remains on OxyContin for pain control, "but I only take it once a day even though it's prescribed for me to take it twice daily."  She uses hydrocodone for breakthrough pain as needed, "but I don't even need to take that every day."  Dr. Trenton Gammon is managing her pain.    She isn't sleeping well. Reports having some hot flashes with associated mild night sweats.  She tells me she had a kidney stone about 2 weeks ago.  She feels sluggish and tired.  She also reports having intermittent abdominal pain with eating for the past few weeks.  Her appetite is good overall.  She is constipated; "my stools are really hard."     MEDICAL HISTORY: Past Medical History:  Diagnosis Date  . Bronchitis, chronic ([place])   . Chronic back pain   . Chronic kidney disease    kidney stone s/p stent placement ( removed)  . Complication of anesthesia   . Compression fracture of L3 lumbar vertebra (HCC)   . Diverticulosis   . GERD (gastroesophageal reflux disease)   . Hemorrhoids   .  Hiatal hernia   . Hx of adenomatous colonic polyps   . Hyperlipidemia   . Hypertension   . Lumbar radicular pain   . PONV (postoperative nausea and vomiting)   .  Smoldering myeloma ([place])   . Sore throat   . T7 vertebral fracture (HCC)     has Benign neoplasm of colon; Esophageal reflux; Other dysphagia; Lumbar compression fracture ([place]); Compression fracture; Hypercalcemia; Hypokalemia; Sepsis ([place]); Epidural abscess, L2-L5; Right hip pain; 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[place]); Other iron deficiency anemia; Osteomyelitis of lumbar spine ([place]); Hypertension; Chronic back pain; Acute low back pain; Epidural abscess; Essential hypertension; MRSA infection; and Nausea with vomiting on her problem list.    ALLERGIES:  Allergies  Allergen Reactions  . Morphine And Related Other (See Comments)    Headache    CURRENT MEDICATIONS:  Outpatient Encounter Prescriptions as of 02/15/2016  Medication Sig  . diazepam (VALIUM) 5 MG tablet Take 1 tablet (5 mg total) by mouth every 8 (eight) hours as needed for anxiety.  Marland Kitchen doxycycline (VIBRA-TABS) 100 MG tablet Take 1 tablet (100 mg total) by mouth daily.  Marland Kitchen HYDROcodone-acetaminophen (NORCO) 10-325 MG tablet Take 1 tablet by mouth every 6 (six) hours as needed.  Marland Kitchen ibuprofen (ADVIL,MOTRIN) 800 MG tablet Take 1 tablet (800 mg total) by mouth every 8 (eight) hours as needed.  . OxyCODONE (OXYCONTIN) 20 mg T12A 12 hr tablet Take 1 tablet (20 mg total) by mouth every 12 (twelve) hours.  . promethazine (PHENERGAN) 12.5 MG tablet Take 1 tablet (12.5 mg total) by mouth every 6 (six) hours as needed for nausea or vomiting.  . RABEprazole (ACIPHEX) 20 MG tablet Take 1 tablet (20 mg total) by mouth daily.  . zoledronic acid (RECLAST) 5 MG/100ML SOLN injection Inject 5 mg into the vein once.   No facility-administered encounter medications on file as of 02/15/2016.     SURGICAL HISTORY: Past Surgical History:  Procedure Laterality Date  . BACK SURGERY    . BONE MARROW ASPIRATION Left 02/2014  . BONE MARROW BIOPSY Left 02/2014  . CHOLECYSTECTOMY    . COLONOSCOPY  12/18/2011   Procedure: COLONOSCOPY;  Surgeon: Lafayette Dragon, MD;   Location: [facility];  Service: Endoscopy;  Laterality: N/A;  . ESOPHAGOGASTRODUODENOSCOPY  12/18/2011   Procedure: ESOPHAGOGASTRODUODENOSCOPY (EGD);  Surgeon: Lafayette Dragon, MD;  Location: [facility];  Service: Endoscopy;  Laterality: N/A;  . LUMBAR LAMINECTOMY FOR EPIDURAL ABSCESS Left 09/22/2013   Procedure: LUMBAR LAMINECTOMY FOR EPIDURAL ABSCESS LEFT LUMBAR FIVE-SACRAL ONE;  Surgeon: Charlie Pitter, MD;  Location: [facility];  Service: Neurosurgery;  Laterality: Left;  . RADIOLOGY WITH ANESTHESIA N/A 08/13/2014   Procedure: RADIOLOGY WITH ANESTHESIA ;  Surgeon: Medication Radiologist, MD;  Location: [facility];  Service: Radiology;  Laterality: N/A;  . URETERAL STENT PLACEMENT    . VERTEBROPLASTY N/A 07/14/2013   Procedure: VERTEBROPLASTY WITH LUMBAR THREE BIOPSY;  Surgeon: Charlie Pitter, MD;  Location: [facility];  Service: Neurosurgery;  Laterality: N/A;  VERTEBROPLASTY WITH LUMBAR THREE BIOPSY    SOCIAL HISTORY: Social History   Social History  . Marital status: Single    Spouse name: N/A  . Number of children: N/A  . Years of education: N/A   Occupational History  . Clerk [employer]   Social History Main Topics  . Smoking status: Never Smoker  . Smokeless tobacco: Never Used  . Alcohol use 0.0 oz/week     Comment: social occ  .  Drug use: No  . Sexual activity: Not on file   Other Topics Concern  . Not on file   Social History Narrative  . No narrative on file    FAMILY HISTORY: Family History  Problem Relation Age of Onset  . Breast cancer Sister 71  . Breast cancer Other 49    aunt    Review of Systems  Review of Systems  Constitutional: Positive for fatigue.  HENT:         Continued sinus issues   Eyes: Negative.   Respiratory: Negative.   Cardiovascular: Negative.   Gastrointestinal: Positive for abdominal pain and constipation.  Endocrine: Positive for hot flashes.  Genitourinary: Negative.    Skin: Negative.     Neurological: Negative.  Negative for headaches.  Hematological: Negative.   Psychiatric/Behavioral: Positive for sleep disturbance.      PHYSICAL EXAMINATION  ECOG PERFORMANCE STATUS: 1    Physical Exam  Constitutional: She is oriented to person, place, and time and well-developed, well-nourished, and in no distress. Well groomed; Accompanied by her friend.  HENT:  Head: Normocephalic and atraumatic.  Nose: Nose normal.  Mouth/Throat: Oropharynx is clear and moist.  Eyes: Conjunctivae normal. Right eye exhibits no discharge. Left eye exhibits no discharge. No scleral icterus.  Neck: Normal range of motion. Neck supple. No tracheal deviation present. No thyromegaly present.  Cardiovascular: Normal rate, regular rhythm. Pulmonary/Chest: Effort normal and breath sounds normal. She has no wheezes. She has no rales.  Abdominal: Soft. Bowel sounds are normal. She exhibits no distension and no mass. Mild epigastric tenderness on palpation.  Musculoskeletal: She exhibits no edema.  Lymphadenopathy:    She has no cervical, supraclavicular, or infraclavicular adenopathy.  Neurological: She is alert and oriented to person, place, and time. No focal deficits.   Skin: Skin is warm and dry. No rash noted.  Psychiatric: Mood, memory, affect and judgment normal.     LABORATORY DATA: I have reviewed the labs below.  CBC Latest Ref Rng & Units 02/15/2016 08/10/2015 04/09/2015  WBC 4.0 - 10.5 K/uL 6.4 6.0 4.9  Hemoglobin 12.0 - 15.0 g/dL 13.1 13.6 13.1  Hematocrit 36.0 - 46.0 % 40.9 41.5 39.4  Platelets 150 - 400 K/uL 249 242 292   CMP Latest Ref Rng & Units 02/15/2016 08/10/2015 04/09/2015  Glucose 65 - 99 mg/dL 106(H) 96 82  BUN 6 - 20 mg/dL '16 17 15  ' Creatinine 0.44 - 1.00 mg/dL 0.74 0.74 0.79  Sodium 135 - 145 mmol/L 138 138 142  Potassium 3.5 - 5.1 mmol/L 3.7 3.8 4.0  Chloride 101 - 111 mmol/L 108 107 108  CO2 22 - 32 mmol/L '25 26 28  ' Calcium 8.9 - 10.3 mg/dL 9.5 9.8 9.6  Total  Protein 6.5 - 8.1 g/dL 6.9 7.0 7.0  Total Bilirubin 0.3 - 1.2 mg/dL 0.8 0.9 0.6  Alkaline Phos 38 - 126 U/L 61 59 87  AST 15 - 41 U/L '22 18 18  ' ALT 14 - 54 U/L '15 14 14   ' Results for CERENITY, GOSHORN (MRN [id number])   Ref. Range 02/15/2016 12:51  IgG (Immunoglobin G), Serum Latest Ref Range: 700 - 1,600 mg/dL 992  IgA Latest Ref Range: 87 - 352 mg/dL 201  IgM, Serum Latest Ref Range: 26 - 217 mg/dL 84    Results for LAMEKA, DISLA (MRN [id number])   Ref. Range 02/15/2016 12:51  Ferritin Latest Ref Range: 11 - 307 ng/mL 10 (L)    ASSESSMENT and THERAPY PLAN:  Plasmacytoma Normal bone marrow biopsy No M spike  She received radiation to the lumbar spine. Bone marrow biopsy has been performed showed no evidence of a plasma cell neoplasm. Bone marrow biopsy has normal cytogenetics. Kappa lambda light chain ratio has been normal, SPEP/IEP has been normal. I recommended ongoing observation and monitoring of laboratory studies. She will return to cancer center to see Dr. Whitney Muse in 6 months (07/2016).   She is due for bone survey; orders placed today to get this done sometime in the next month or so after the holidays.    Iron deficiency anemia  Colonoscopy was performed in October 2013. 8 polyps were removed. Pathology was consistent with tubular adenomas, hyperplastic polyps, and 3 serrated adenomas. No dysplasia or malignancy was found. She is due for recall in 2018. CBC is WNL. Ferritin low at 10 ng/dL; I will discuss plan with Dr. Whitney Muse.   Chronic Back Pain Osteomyelitis Fall with pelvic fracture  She continues to follow with ID. She will continue doxycycline as prescribed. She has not been consistent about taking the doxycycline and I stressed how critical this medication is preventing future serious infections.    Osteoporosis DEXA 12/14/2014  She will continue with yearly Reclast. She received her annual dose today and will be due again in December 2018.    Constipation/Abdominal pain   She is constipated with reportedly having small, hard stools.  We discussed the importance of having an adequate bowel regimen, particularly when taking opiate pain medications.  I recommended she start Miralax once daily, in addition to Senokot 2 tabs in AM & PM or Colace in the AM & PM.  She understands the importance of having both a stool softener and gentle laxative to relieve constipation in patients on pain meds.  Her abdominal pain is in more of the epigastric/gastric region. She could have some gastritis secondary to recent steroid use and chronic antibiotic use.  She tells me she does not consistently take her  aciphex. Stressed the importance of PPI therapy for her right now.  Her mild abdominal pain could also be due to constipation.  I think adequate bowel regimen and PPI therapy will resolve these complaints. Encouraged her to call us with any problems, questions or concerns.    Hot flashes, likely secondary to menopause  There are several medications we could try to help manage her hot flashes, however given her current medication regimen I think it is better to try to manage these more conservatively at this time.  I recommended that she try OTC Vitamin E capsules 400 mg daily to help with the hot flashes/night sweats.    Health Maintenance  Her last mammogram was done on 03/23/15 at the [facility].  She will be due for routine screening mammo in 02/2016; orders placed today.     All questions were answered. The patient knows to call the clinic with any problems, questions or concerns. We can certainly see the patient much sooner if necessary.   Dispo:  -Return to cancer center in 6 months with repeat lab work; ordered today.  A total of 30 minutes was spent in face-to-face care of this patient, with greater than 50% of that time spent in counseling and care-coordination.   Mike Craze, NP [place] [phone]

## 2016-02-15 ENCOUNTER — Ambulatory Visit (HOSPITAL_COMMUNITY): Payer: BLUE CROSS/BLUE SHIELD | Admitting: Hematology & Oncology

## 2016-02-15 ENCOUNTER — Encounter (HOSPITAL_BASED_OUTPATIENT_CLINIC_OR_DEPARTMENT_OTHER): Payer: BLUE CROSS/BLUE SHIELD

## 2016-02-15 ENCOUNTER — Ambulatory Visit (HOSPITAL_COMMUNITY): Payer: BLUE CROSS/BLUE SHIELD

## 2016-02-15 ENCOUNTER — Encounter (HOSPITAL_COMMUNITY): Payer: Self-pay

## 2016-02-15 ENCOUNTER — Encounter (HOSPITAL_BASED_OUTPATIENT_CLINIC_OR_DEPARTMENT_OTHER): Payer: BLUE CROSS/BLUE SHIELD | Admitting: Adult Health

## 2016-02-15 ENCOUNTER — Encounter (HOSPITAL_COMMUNITY): Payer: BLUE CROSS/BLUE SHIELD | Attending: Hematology & Oncology

## 2016-02-15 VITALS — BP 116/62 | HR 65 | Temp 98.1°F | Resp 18 | Wt 156.7 lb

## 2016-02-15 DIAGNOSIS — Z9049 Acquired absence of other specified parts of digestive tract: Secondary | ICD-10-CM | POA: Insufficient documentation

## 2016-02-15 DIAGNOSIS — M545 Low back pain: Secondary | ICD-10-CM

## 2016-02-15 DIAGNOSIS — N189 Chronic kidney disease, unspecified: Secondary | ICD-10-CM | POA: Diagnosis not present

## 2016-02-15 DIAGNOSIS — C9030 Solitary plasmacytoma not having achieved remission: Secondary | ICD-10-CM | POA: Insufficient documentation

## 2016-02-15 DIAGNOSIS — G8929 Other chronic pain: Secondary | ICD-10-CM | POA: Insufficient documentation

## 2016-02-15 DIAGNOSIS — M810 Age-related osteoporosis without current pathological fracture: Secondary | ICD-10-CM

## 2016-02-15 DIAGNOSIS — D509 Iron deficiency anemia, unspecified: Secondary | ICD-10-CM | POA: Insufficient documentation

## 2016-02-15 DIAGNOSIS — Z79899 Other long term (current) drug therapy: Secondary | ICD-10-CM | POA: Insufficient documentation

## 2016-02-15 DIAGNOSIS — R109 Unspecified abdominal pain: Secondary | ICD-10-CM

## 2016-02-15 DIAGNOSIS — I129 Hypertensive chronic kidney disease with stage 1 through stage 4 chronic kidney disease, or unspecified chronic kidney disease: Secondary | ICD-10-CM | POA: Insufficient documentation

## 2016-02-15 DIAGNOSIS — D508 Other iron deficiency anemias: Secondary | ICD-10-CM

## 2016-02-15 DIAGNOSIS — N951 Menopausal and female climacteric states: Secondary | ICD-10-CM

## 2016-02-15 DIAGNOSIS — Z1231 Encounter for screening mammogram for malignant neoplasm of breast: Secondary | ICD-10-CM

## 2016-02-15 DIAGNOSIS — K5900 Constipation, unspecified: Secondary | ICD-10-CM

## 2016-02-15 DIAGNOSIS — Z9889 Other specified postprocedural states: Secondary | ICD-10-CM | POA: Insufficient documentation

## 2016-02-15 LAB — CBC WITH DIFFERENTIAL/PLATELET
Basophils Absolute: 0.1 10*3/uL (ref 0.0–0.1)
Basophils Relative: 1 %
Eosinophils Absolute: 0.2 10*3/uL (ref 0.0–0.7)
Eosinophils Relative: 3 %
HCT: 40.9 % (ref 36.0–46.0)
Hemoglobin: 13.1 g/dL (ref 12.0–15.0)
Lymphocytes Relative: 32 %
Lymphs Abs: 2 10*3/uL (ref 0.7–4.0)
MCH: 27.2 pg (ref 26.0–34.0)
MCHC: 32 g/dL (ref 30.0–36.0)
MCV: 85 fL (ref 78.0–100.0)
Monocytes Absolute: 0.4 10*3/uL (ref 0.1–1.0)
Monocytes Relative: 6 %
Neutro Abs: 3.8 10*3/uL (ref 1.7–7.7)
Neutrophils Relative %: 58 %
Platelets: 249 10*3/uL (ref 150–400)
RBC: 4.81 MIL/uL (ref 3.87–5.11)
RDW: 13.9 % (ref 11.5–15.5)
WBC: 6.4 10*3/uL (ref 4.0–10.5)

## 2016-02-15 LAB — COMPREHENSIVE METABOLIC PANEL
ALT: 15 U/L (ref 14–54)
AST: 22 U/L (ref 15–41)
Albumin: 3.8 g/dL (ref 3.5–5.0)
Alkaline Phosphatase: 61 U/L (ref 38–126)
Anion gap: 5 (ref 5–15)
BUN: 16 mg/dL (ref 6–20)
CO2: 25 mmol/L (ref 22–32)
Calcium: 9.5 mg/dL (ref 8.9–10.3)
Chloride: 108 mmol/L (ref 101–111)
Creatinine, Ser: 0.74 mg/dL (ref 0.44–1.00)
GFR calc Af Amer: >60 mL/min (ref >60)
GFR calc non Af Amer: >60 mL/min (ref >60)
Glucose, Bld: 106 mg/dL — ABNORMAL HIGH (ref 65–99)
Potassium: 3.7 mmol/L (ref 3.5–5.1)
Sodium: 138 mmol/L (ref 135–145)
Total Bilirubin: 0.8 mg/dL (ref 0.3–1.2)
Total Protein: 6.9 g/dL (ref 6.5–8.1)

## 2016-02-15 LAB — FERRITIN: Ferritin: 10 ng/mL — ABNORMAL LOW (ref 11–307)

## 2016-02-15 MED ORDER — SODIUM CHLORIDE 0.9 % IV SOLN
INTRAVENOUS | Status: DC
  Administered 2016-02-15: 14:00:00 via INTRAVENOUS

## 2016-02-15 MED ORDER — ZOLEDRONIC ACID 5 MG/100ML IV SOLN
5.0000 mg | Freq: Once | INTRAVENOUS | Status: AC
  Administered 2016-02-15: 5 mg via INTRAVENOUS
  Filled 2016-02-15: qty 100

## 2016-02-15 NOTE — Progress Notes (Signed)
Tolerated infusion w/o adverse reaction.  Alert, in no distress.  VSS.  Discharged ambulatory.  

## 2016-02-15 NOTE — Patient Instructions (Signed)
[place] at [facility] Discharge Instructions  RECOMMENDATIONS MADE BY THE CONSULTANT AND ANY TEST RESULTS WILL BE SENT TO YOUR REFERRING PHYSICIAN.  You saw Autumn Craze, NP and Dr.Penland today. See Autumn Duran at checkout for appointments.  Thank you for choosing [place] at [facility] to provide your oncology and hematology care.  To afford each patient quality time with our provider, please arrive at least 15 minutes before your scheduled appointment time.   Beginning January 23rd 2017 lab work for the [facility] will be done in the  Main lab at [facility] on 1st floor. If you have a lab appointment with the [facility] please come in thru the  Main Entrance and check in at the main information desk  You need to re-schedule your appointment should you arrive 10 or more minutes late.  We strive to give you quality time with our providers, and arriving late affects you and other patients whose appointments are after yours.  Also, if you no show three or more times for appointments you may be dismissed from the clinic at the providers discretion.     Again, thank you for choosing [facility].  Our hope is that these requests will decrease the amount of time that you wait before being seen by our physicians.       _____________________________________________________________  Should you have questions after your visit to [facility], please contact our office at [phone] between the hours of 8:30 a.m. and 4:30 p.m.  Voicemails left after 4:30 p.m. will not be returned until the following business day.  For prescription refill requests, have your pharmacy contact our office.         Resources For Cancer Patients and their Caregivers ? American Cancer Society: Can assist with transportation, wigs, general needs, runs Look Good Feel Better.        (978)211-2972 ? Cancer Care: Provides financial  assistance, online support groups, medication/co-pay assistance.  1-800-813-HOPE (347) 622-3477) ? [place] Assists [place] cancer patients and their families through emotional , educational and financial support.  704-737-1557 ? [place] DSS Where to apply for food stamps, Medicaid and utility assistance. 954 871 0986 ? RCATS: Transportation to medical appointments. 289-142-3746 ? Social Security Administration: May apply for disability if have a Stage IV cancer. 6135955517 5032593424 ? [employer], Disability and Transit Services: Assists with nutrition, care and transit needs. [place] Support Programs: '@10RELATIVEDAYS'$ @ > Cancer Support Group  2nd Tuesday of the month 1pm-2pm, Journey Room  > Creative Journey  3rd Tuesday of the month 1130am-1pm, Journey Room  > Look Good Feel Better  1st Wednesday of the month 10am-12 noon, Journey Room (Call [place] to register [phone])

## 2016-02-16 ENCOUNTER — Encounter (HOSPITAL_COMMUNITY): Payer: Self-pay | Admitting: Adult Health

## 2016-02-16 LAB — IGG, IGA, IGM
IgA: 201 mg/dL (ref 87–352)
IgG (Immunoglobin G), Serum: 992 mg/dL (ref 700–1600)
IgM, Serum: 84 mg/dL (ref 26–217)

## 2016-02-16 LAB — KAPPA/LAMBDA LIGHT CHAINS
Kappa free light chain: 17.4 mg/L (ref 3.3–19.4)
Kappa, lambda light chain ratio: 0.85 (ref 0.26–1.65)
Lambda free light chains: 20.5 mg/L (ref 5.7–26.3)

## 2016-02-17 ENCOUNTER — Ambulatory Visit (INDEPENDENT_AMBULATORY_CARE_PROVIDER_SITE_OTHER): Payer: BLUE CROSS/BLUE SHIELD | Admitting: Otolaryngology

## 2016-02-17 DIAGNOSIS — J330 Polyp of nasal cavity: Secondary | ICD-10-CM

## 2016-02-17 DIAGNOSIS — J342 Deviated nasal septum: Secondary | ICD-10-CM

## 2016-02-17 DIAGNOSIS — J310 Chronic rhinitis: Secondary | ICD-10-CM | POA: Diagnosis not present

## 2016-02-17 LAB — PROTEIN ELECTROPHORESIS, SERUM
A/G Ratio: 1.2 (ref 0.7–1.7)
Albumin ELP: 3.6 g/dL (ref 2.9–4.4)
Alpha-1-Globulin: 0.3 g/dL (ref 0.0–0.4)
Alpha-2-Globulin: 0.7 g/dL (ref 0.4–1.0)
Beta Globulin: 1 g/dL (ref 0.7–1.3)
Gamma Globulin: 1 g/dL (ref 0.4–1.8)
Globulin, Total: 3 g/dL (ref 2.2–3.9)
Total Protein ELP: 6.6 g/dL (ref 6.0–8.5)

## 2016-02-17 LAB — IMMUNOFIXATION ELECTROPHORESIS
IgA: 199 mg/dL (ref 87–352)
IgG (Immunoglobin G), Serum: 982 mg/dL (ref 700–1600)
IgM, Serum: 83 mg/dL (ref 26–217)
Total Protein ELP: 6.5 g/dL (ref 6.0–8.5)

## 2016-04-04 ENCOUNTER — Other Ambulatory Visit (HOSPITAL_COMMUNITY): Payer: Self-pay | Admitting: Oncology

## 2016-04-05 ENCOUNTER — Telehealth (HOSPITAL_COMMUNITY): Payer: Self-pay

## 2016-04-05 NOTE — Telephone Encounter (Signed)
Left patient message on voicemail that she needs a dose of IV iron and Amy will call to schedule. If she has any questions call cancer center.

## 2016-04-05 NOTE — Telephone Encounter (Signed)
-----   Message from [facility], PA-C sent at 04/04/2016  6:31 PM EST ----- Regarding: FW: Low ferritin Please set-up for 1 dose of Feraheme  TK ----- Message ----- From: Patrici Ranks, MD Sent: 04/04/2016   1:02 PM To: B[facility], PA-C Subject: FW: Low ferritin                               Would you set up for dose of feraheme? Dr.P  ----- Message ----- From: Holley Bouche, NP Sent: 02/16/2016   2:55 PM To: [facility], PA-C, Patrici Ranks, MD Subject: Low ferritin                                   Ms. Rohde ferritin came back low today at 10.  CBC is normal.  She has known history of iron def anemia. Clinically, she is mildly fatigued, but I'm not sure it's much beyond her baseline. Right now, I have her coming back in 6 months.  Not sure what you'd like to do from here; happy to do whatever you need me to do!   Elzie Rings

## 2016-04-13 ENCOUNTER — Ambulatory Visit (HOSPITAL_COMMUNITY)
Admission: RE | Admit: 2016-04-13 | Discharge: 2016-04-13 | Disposition: A | Discharge status: Home / Self Care | Payer: PPO | Source: Ambulatory Visit | Attending: Hematology & Oncology | Admitting: Hematology & Oncology

## 2016-04-13 ENCOUNTER — Encounter (HOSPITAL_COMMUNITY): Payer: BLUE CROSS/BLUE SHIELD | Attending: Hematology & Oncology

## 2016-04-13 ENCOUNTER — Encounter (HOSPITAL_COMMUNITY): Payer: Self-pay

## 2016-04-13 VITALS — BP 119/63 | HR 58 | Temp 98.0°F | Resp 18

## 2016-04-13 DIAGNOSIS — Z79899 Other long term (current) drug therapy: Secondary | ICD-10-CM | POA: Insufficient documentation

## 2016-04-13 DIAGNOSIS — D508 Other iron deficiency anemias: Secondary | ICD-10-CM

## 2016-04-13 DIAGNOSIS — I129 Hypertensive chronic kidney disease with stage 1 through stage 4 chronic kidney disease, or unspecified chronic kidney disease: Secondary | ICD-10-CM | POA: Insufficient documentation

## 2016-04-13 DIAGNOSIS — N189 Chronic kidney disease, unspecified: Secondary | ICD-10-CM | POA: Insufficient documentation

## 2016-04-13 DIAGNOSIS — G8929 Other chronic pain: Secondary | ICD-10-CM | POA: Insufficient documentation

## 2016-04-13 DIAGNOSIS — C9030 Solitary plasmacytoma not having achieved remission: Secondary | ICD-10-CM | POA: Diagnosis not present

## 2016-04-13 DIAGNOSIS — Z9049 Acquired absence of other specified parts of digestive tract: Secondary | ICD-10-CM | POA: Insufficient documentation

## 2016-04-13 DIAGNOSIS — S22060A Wedge compression fracture of T7-T8 vertebra, initial encounter for closed fracture: Secondary | ICD-10-CM | POA: Diagnosis not present

## 2016-04-13 DIAGNOSIS — Z9889 Other specified postprocedural states: Secondary | ICD-10-CM | POA: Insufficient documentation

## 2016-04-13 DIAGNOSIS — D509 Iron deficiency anemia, unspecified: Secondary | ICD-10-CM | POA: Insufficient documentation

## 2016-04-13 DIAGNOSIS — M4854XA Collapsed vertebra, not elsewhere classified, thoracic region, initial encounter for fracture: Secondary | ICD-10-CM | POA: Diagnosis not present

## 2016-04-13 MED ORDER — SODIUM CHLORIDE 0.9 % IV SOLN
510.0000 mg | Freq: Once | INTRAVENOUS | Status: AC
  Administered 2016-04-13: 510 mg via INTRAVENOUS
  Filled 2016-04-13: qty 17

## 2016-04-13 MED ORDER — SODIUM CHLORIDE 0.9 % IV SOLN
Freq: Once | INTRAVENOUS | Status: AC
  Administered 2016-04-13: 14:00:00 via INTRAVENOUS

## 2016-04-13 NOTE — Patient Instructions (Signed)
[place] at [facility] Discharge Instructions  RECOMMENDATIONS MADE BY THE CONSULTANT AND ANY TEST RESULTS WILL BE SENT TO YOUR REFERRING PHYSICIAN.  Feraheme given  Follow up as scheduled.  Thank you for choosing [place] at [facility] to provide your oncology and hematology care.  To afford each patient quality time with our provider, please arrive at least 15 minutes before your scheduled appointment time.    If you have a lab appointment with the [facility] please come in thru the  Main Entrance and check in at the main information desk  You need to re-schedule your appointment should you arrive 10 or more minutes late.  We strive to give you quality time with our providers, and arriving late affects you and other patients whose appointments are after yours.  Also, if you no show three or more times for appointments you may be dismissed from the clinic at the providers discretion.     Again, thank you for choosing [facility].  Our hope is that these requests will decrease the amount of time that you wait before being seen by our physicians.       _____________________________________________________________  Should you have questions after your visit to [facility], please contact our office at [phone] between the hours of 8:30 a.m. and 4:30 p.m.  Voicemails left after 4:30 p.m. will not be returned until the following business day.  For prescription refill requests, have your pharmacy contact our office.       Resources For Cancer Patients and their Caregivers ? American Cancer Society: Can assist with transportation, wigs, general needs, runs Look Good Feel Better.        365-565-1375 ? Cancer Care: Provides financial assistance, online support groups, medication/co-pay assistance.  1-800-813-HOPE (850) 524-7307) ? [place] Assists [place] cancer patients and their  families through emotional , educational and financial support.  564-079-0604 ? [place] DSS Where to apply for food stamps, Medicaid and utility assistance. 717-756-7690 ? RCATS: Transportation to medical appointments. 938-472-2643 ? Social Security Administration: May apply for disability if have a Stage IV cancer. 213-640-3002 775-764-4158 ? [employer], Disability and Transit Services: Assists with nutrition, care and transit needs. [place] Support Programs: '@10RELATIVEDAYS'$ @ > Cancer Support Group  2nd Tuesday of the month 1pm-2pm, Journey Room  > Creative Journey  3rd Tuesday of the month 1130am-1pm, Journey Room  > Look Good Feel Better  1st Wednesday of the month 10am-12 noon, Journey Room (Call [place] to register [phone])

## 2016-04-13 NOTE — Progress Notes (Signed)
Feraheme given today per orders. Patient tolerated it well, no problems. Vitals stable and discharged home ambulatory from clinic.follow up as scheduled.

## 2016-05-19 DIAGNOSIS — M4626 Osteomyelitis of vertebra, lumbar region: Secondary | ICD-10-CM | POA: Diagnosis not present

## 2016-05-19 DIAGNOSIS — I10 Essential (primary) hypertension: Secondary | ICD-10-CM | POA: Diagnosis not present

## 2016-05-19 DIAGNOSIS — Z6829 Body mass index (BMI) 29.0-29.9, adult: Secondary | ICD-10-CM | POA: Diagnosis not present

## 2016-07-12 DIAGNOSIS — Z7189 Other specified counseling: Secondary | ICD-10-CM | POA: Diagnosis not present

## 2016-07-12 DIAGNOSIS — I10 Essential (primary) hypertension: Secondary | ICD-10-CM | POA: Diagnosis not present

## 2016-07-12 DIAGNOSIS — F419 Anxiety disorder, unspecified: Secondary | ICD-10-CM | POA: Diagnosis not present

## 2016-07-12 DIAGNOSIS — E785 Hyperlipidemia, unspecified: Secondary | ICD-10-CM | POA: Diagnosis not present

## 2016-07-12 DIAGNOSIS — K219 Gastro-esophageal reflux disease without esophagitis: Secondary | ICD-10-CM | POA: Diagnosis not present

## 2016-07-12 DIAGNOSIS — Z6824 Body mass index (BMI) 24.0-24.9, adult: Secondary | ICD-10-CM | POA: Diagnosis not present

## 2016-07-12 DIAGNOSIS — Z1389 Encounter for screening for other disorder: Secondary | ICD-10-CM | POA: Diagnosis not present

## 2016-07-12 DIAGNOSIS — Z0000 Encounter for general adult medical examination without abnormal findings: Secondary | ICD-10-CM | POA: Diagnosis not present

## 2016-07-12 DIAGNOSIS — Z1211 Encounter for screening for malignant neoplasm of colon: Secondary | ICD-10-CM | POA: Diagnosis not present

## 2016-07-12 DIAGNOSIS — Z299 Encounter for prophylactic measures, unspecified: Secondary | ICD-10-CM | POA: Diagnosis not present

## 2016-07-12 DIAGNOSIS — Z79899 Other long term (current) drug therapy: Secondary | ICD-10-CM | POA: Diagnosis not present

## 2016-08-10 ENCOUNTER — Ambulatory Visit (INDEPENDENT_AMBULATORY_CARE_PROVIDER_SITE_OTHER): Payer: PPO | Admitting: Otolaryngology

## 2016-08-16 ENCOUNTER — Ambulatory Visit (HOSPITAL_COMMUNITY): Payer: BLUE CROSS/BLUE SHIELD | Admitting: Hematology & Oncology

## 2016-08-16 ENCOUNTER — Other Ambulatory Visit (HOSPITAL_COMMUNITY): Payer: BLUE CROSS/BLUE SHIELD

## 2016-08-17 ENCOUNTER — Ambulatory Visit (HOSPITAL_COMMUNITY): Payer: BLUE CROSS/BLUE SHIELD

## 2016-08-17 ENCOUNTER — Other Ambulatory Visit (HOSPITAL_COMMUNITY): Payer: BLUE CROSS/BLUE SHIELD

## 2016-08-25 DIAGNOSIS — M4626 Osteomyelitis of vertebra, lumbar region: Secondary | ICD-10-CM | POA: Diagnosis not present

## 2016-09-06 DIAGNOSIS — L089 Local infection of the skin and subcutaneous tissue, unspecified: Secondary | ICD-10-CM | POA: Diagnosis not present

## 2016-09-13 ENCOUNTER — Ambulatory Visit (HOSPITAL_COMMUNITY): Payer: Self-pay

## 2016-09-13 ENCOUNTER — Other Ambulatory Visit (HOSPITAL_COMMUNITY): Payer: PPO

## 2016-09-14 DIAGNOSIS — Z6828 Body mass index (BMI) 28.0-28.9, adult: Secondary | ICD-10-CM | POA: Diagnosis not present

## 2016-09-14 DIAGNOSIS — Z299 Encounter for prophylactic measures, unspecified: Secondary | ICD-10-CM | POA: Diagnosis not present

## 2016-09-14 DIAGNOSIS — I10 Essential (primary) hypertension: Secondary | ICD-10-CM | POA: Diagnosis not present

## 2016-09-14 DIAGNOSIS — J0100 Acute maxillary sinusitis, unspecified: Secondary | ICD-10-CM | POA: Diagnosis not present

## 2016-09-14 DIAGNOSIS — Z713 Dietary counseling and surveillance: Secondary | ICD-10-CM | POA: Diagnosis not present

## 2016-09-25 ENCOUNTER — Other Ambulatory Visit (HOSPITAL_COMMUNITY): Payer: PPO

## 2016-10-09 ENCOUNTER — Ambulatory Visit (HOSPITAL_COMMUNITY): Payer: Self-pay

## 2016-10-09 ENCOUNTER — Other Ambulatory Visit (HOSPITAL_COMMUNITY): Payer: PPO

## 2016-10-20 ENCOUNTER — Encounter (HOSPITAL_BASED_OUTPATIENT_CLINIC_OR_DEPARTMENT_OTHER): Payer: PPO | Admitting: Oncology

## 2016-10-20 ENCOUNTER — Encounter (HOSPITAL_COMMUNITY): Payer: Self-pay

## 2016-10-20 ENCOUNTER — Encounter (HOSPITAL_COMMUNITY): Payer: PPO | Attending: Adult Health

## 2016-10-20 VITALS — BP 110/79 | HR 65 | Temp 98.1°F | Resp 16 | Wt 159.2 lb

## 2016-10-20 DIAGNOSIS — D509 Iron deficiency anemia, unspecified: Secondary | ICD-10-CM

## 2016-10-20 DIAGNOSIS — C9030 Solitary plasmacytoma not having achieved remission: Secondary | ICD-10-CM | POA: Diagnosis not present

## 2016-10-20 DIAGNOSIS — R112 Nausea with vomiting, unspecified: Secondary | ICD-10-CM | POA: Diagnosis not present

## 2016-10-20 LAB — CBC WITH DIFFERENTIAL/PLATELET
Basophils Absolute: 0.1 10*3/uL (ref 0.0–0.1)
Basophils Relative: 1 %
Eosinophils Absolute: 0.2 10*3/uL (ref 0.0–0.7)
Eosinophils Relative: 2 %
HCT: 40 % (ref 36.0–46.0)
Hemoglobin: 13.1 g/dL (ref 12.0–15.0)
Lymphocytes Relative: 22 %
Lymphs Abs: 1.6 10*3/uL (ref 0.7–4.0)
MCH: 28.4 pg (ref 26.0–34.0)
MCHC: 32.8 g/dL (ref 30.0–36.0)
MCV: 86.8 fL (ref 78.0–100.0)
Monocytes Absolute: 0.5 10*3/uL (ref 0.1–1.0)
Monocytes Relative: 6 %
Neutro Abs: 4.8 10*3/uL (ref 1.7–7.7)
Neutrophils Relative %: 69 %
Platelets: 320 10*3/uL (ref 150–400)
RBC: 4.61 MIL/uL (ref 3.87–5.11)
RDW: 13.3 % (ref 11.5–15.5)
WBC: 7 10*3/uL (ref 4.0–10.5)

## 2016-10-20 LAB — COMPREHENSIVE METABOLIC PANEL
ALT: 15 U/L (ref 14–54)
AST: 26 U/L (ref 15–41)
Albumin: 3.9 g/dL (ref 3.5–5.0)
Alkaline Phosphatase: 60 U/L (ref 38–126)
Anion gap: 5 (ref 5–15)
BUN: 12 mg/dL (ref 6–20)
CO2: 28 mmol/L (ref 22–32)
Calcium: 9.8 mg/dL (ref 8.9–10.3)
Chloride: 105 mmol/L (ref 101–111)
Creatinine, Ser: 0.89 mg/dL (ref 0.44–1.00)
GFR calc Af Amer: >60 mL/min (ref >60)
GFR calc non Af Amer: >60 mL/min (ref >60)
Glucose, Bld: 90 mg/dL (ref 65–99)
Potassium: 4.3 mmol/L (ref 3.5–5.1)
Sodium: 138 mmol/L (ref 135–145)
Total Bilirubin: 0.8 mg/dL (ref 0.3–1.2)
Total Protein: 7.2 g/dL (ref 6.5–8.1)

## 2016-10-20 LAB — IRON AND TIBC
Iron: 59 ug/dL (ref 28–170)
Saturation Ratios: 16 % (ref 10.4–31.8)
TIBC: 358 ug/dL (ref 250–450)
UIBC: 299 ug/dL

## 2016-10-20 LAB — FERRITIN: Ferritin: 19 ng/mL (ref 11–307)

## 2016-10-20 NOTE — Patient Instructions (Signed)
[facility] at [facility] Discharge Instructions  RECOMMENDATIONS MADE BY THE CONSULTANT AND ANY TEST RESULTS WILL BE SENT TO YOUR REFERRING PHYSICIAN.  You saw Dr. Zhou today.  Thank you for choosing [facility] at [facility] to provide your oncology and hematology care.  To afford each patient quality time with our provider, please arrive at least 15 minutes before your scheduled appointment time.    If you have a lab appointment with the Cancer Center please come in thru the  Main Entrance and check in at the main information desk  You need to re-schedule your appointment should you arrive 10 or more minutes late.  We strive to give you quality time with our providers, and arriving late affects you and other patients whose appointments are after yours.  Also, if you no show three or more times for appointments you may be dismissed from the clinic at the providers discretion.     Again, thank you for choosing [facility].  Our hope is that these requests will decrease the amount of time that you wait before being seen by our physicians.       _____________________________________________________________  Should you have questions after your visit to  Cancer Center, please contact our office at [phone] between the hours of 8:30 a.m. and 4:30 p.m.  Voicemails left after 4:30 p.m. will not be returned until the following business day.  For prescription refill requests, have your pharmacy contact our office.       Resources For Cancer Patients and their Caregivers ? American Cancer Society: Can assist with transportation, wigs, general needs, runs Look Good Feel Better.        1-888-227-6333 ? Cancer Care: Provides financial assistance, online support groups, medication/co-pay assistance.  1-800-813-HOPE (4673) ? [facility] Assists [place] cancer patients and their families through  emotional , educational and financial support.  336-427-4357 ? [place] DSS Where to apply for food stamps, Medicaid and utility assistance. 336-342-1394 ? RCATS: Transportation to medical appointments. 336-347-2287 ? Social Security Administration: May apply for disability if have a Stage IV cancer. 336-342-7796 1-800-772-1213 ? [place] Aging, Disability and Transit Services: Assists with nutrition, care and transit needs. 336-349-2343  Cancer Center Support Programs: @10RELATIVEDAYS@ > Cancer Support Group  2nd Tuesday of the month 1pm-2pm, Journey Room  > Creative Journey  3rd Tuesday of the month 1130am-1pm, Journey Room  > Look Good Feel Better  1st Wednesday of the month 10am-12 noon, Journey Room (Call American Cancer Society to register 1-800-395-5775)    

## 2016-10-20 NOTE — Progress Notes (Signed)
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 On 01/06/2014 she was seen by infectious disease who recommended continuing doxycycline therapy.  Osteomyelitis lumbar spine L2-L5 MRSA infection Relapsed infection 10/01/2014 8 weeks IV Vancomycin  Diagnosis Bone, biopsy, lumbar three - LAMELLAR BONE WITH ASSOCIATED FIBROUS TISSUE AND INCREASED PLASMA CELLS. - THERE IS NO EVIDENCE OF MALIGNANCY. - SEE COMMENT. Microscopic Comment Pending immunohistochemical stains. Diagnosis Note There are scattered plasma cells (approximately 5-10%), however, no aggregates are seen. Light chain immunohistochemistry is technically unsatisfactory. Pancytokeratin is negative. Correlation with clinical, radiographic, and laboratory data (SPEP, etc.) is recommended. Vicente Males MD Pathologist, Electronic Signature (Case signed 07/18/2013)  Hypercalcemia mild, normal PTH  Microcytic anemia, Colonoscopy and EGD 11/2011. History of multiple polyps (tubular adenomas, serrated adenomas, hyperplastic polyps)  No evidence of an M spike, normal immunoglobulin levels, normal Kappa/Lambda ratio  Bone marrow biopsy and aspirate on 03/26/2014 showing no diagnostic morphologic evidence of plasma cell neoplasm. Normocellular to slightly hypercellular marrow particles with maturing trilineage hematopoiesis, and mild nonspecific dyserythroipoiesis. No significant iron stores  Iron deficiency anemia, colonoscopy in 2013 with polyps and internal hemorrhoids, on recall in 2018. EGD in 2013 with irregular Z line and stricture. Pathology  without evidence of Barrett's  IV iron replacement 2/19, 2/26, 3/4   CURRENT THERAPY: Observation  INTERVAL HISTORY: Autumn Duran 66 y.o. 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   Patient states that she's been having persistent nausea/vomiting after every time she eats. She was told in the past that she had a hiatal hernia but then had a repeat endoscopy and was told that she did not have one. She states that she is only able to eat small meals and at times it still causes her to have immediate nausea and vomiting. She denies any diarrhea. She has a chronic low back pain which is unchanged. Otherwise she denies any chest pain, abdominal pain, shortness of breath. She continues to have persistent sinus issues. She also states she gets occasional left leg muscle cramps.     MEDICAL HISTORY: Past Medical History:  Diagnosis Date  . Bronchitis, chronic ([place])   . Chronic back pain   . Chronic kidney disease    kidney stone s/p stent placement ( removed)  . Complication of anesthesia   . Compression fracture of L3 lumbar vertebra (HCC)   . Diverticulosis   . GERD (gastroesophageal reflux disease)   . Hemorrhoids   . Hiatal hernia   . Hx of adenomatous colonic polyps   . Hyperlipidemia   . Hypertension   . Lumbar radicular pain   . PONV (postoperative nausea and vomiting)   . Smoldering myeloma ([place])   . Sore throat   . T7 vertebral fracture (HCC)     has Benign neoplasm of colon; Esophageal reflux; Other dysphagia; Lumbar compression fracture ([facility]); Compression fracture; Hypercalcemia; Hypokalemia; Sepsis ([place]); Epidural abscess, L2-L5; Right hip pain; 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[place]); Other iron deficiency anemia; Osteomyelitis of lumbar spine ([place]); Hypertension; Chronic back pain; Acute low back pain; Epidural abscess; Essential hypertension; MRSA infection; and Nausea with vomiting on her problem list.  ALLERGIES:  Allergies  Allergen Reactions  . Morphine And  Related Other (See Comments)    Headache  . Tetracycline Nausea Only    CURRENT MEDICATIONS:  Outpatient Encounter Prescriptions as of 10/20/2016  Medication Sig  . diazepam (VALIUM) 5 MG tablet Take 1 tablet (5 mg total) by mouth every 8 (eight) hours as needed for anxiety.  Marland Kitchen doxycycline (VIBRA-TABS) 100 MG tablet Take 1 tablet (100 mg total) by mouth daily.  Marland Kitchen HYDROcodone-acetaminophen (NORCO) 10-325 MG tablet Take 1 tablet by mouth every 6 (six) hours as needed.  Marland Kitchen ibuprofen (ADVIL,MOTRIN) 800 MG tablet Take 1 tablet (800 mg total) by mouth every 8 (eight) hours as needed.  . OxyCODONE (OXYCONTIN) 20 mg T12A 12 hr tablet Take 1 tablet (20 mg total) by mouth every 12 (twelve) hours.  . promethazine (PHENERGAN) 12.5 MG tablet Take 1 tablet (12.5 mg total) by mouth every 6 (six) hours as needed for nausea or vomiting.  . RABEprazole (ACIPHEX) 20 MG tablet Take 1 tablet (20 mg total) by mouth daily.  . zoledronic acid (RECLAST) 5 MG/100ML SOLN injection Inject 5 mg into the vein once.   No facility-administered encounter medications on file as of 10/20/2016.     SURGICAL HISTORY: Past Surgical History:  Procedure Laterality Date  . BACK SURGERY    . BONE MARROW ASPIRATION Left 02/2014  . BONE MARROW BIOPSY Left 02/2014  . CHOLECYSTECTOMY    . COLONOSCOPY  12/18/2011   Procedure: COLONOSCOPY;  Surgeon: Lafayette Dragon, MD;  Location: [facility];  Service: Endoscopy;  Laterality: N/A;  . ESOPHAGOGASTRODUODENOSCOPY  12/18/2011   Procedure: ESOPHAGOGASTRODUODENOSCOPY (EGD);  Surgeon: Lafayette Dragon, MD;  Location: [facility];  Service: Endoscopy;  Laterality: N/A;  . LUMBAR LAMINECTOMY FOR EPIDURAL ABSCESS Left 09/22/2013   Procedure: LUMBAR LAMINECTOMY FOR EPIDURAL ABSCESS LEFT LUMBAR FIVE-SACRAL ONE;  Surgeon: Charlie Pitter, MD;  Location: [facility];  Service: Neurosurgery;  Laterality: Left;  . RADIOLOGY WITH ANESTHESIA N/A 08/13/2014   Procedure: RADIOLOGY WITH ANESTHESIA ;  Surgeon:  Medication Radiologist, MD;  Location: [facility];  Service: Radiology;  Laterality: N/A;  . URETERAL STENT PLACEMENT    . VERTEBROPLASTY N/A 07/14/2013   Procedure: VERTEBROPLASTY WITH LUMBAR THREE BIOPSY;  Surgeon: Charlie Pitter, MD;  Location: [facility];  Service: Neurosurgery;  Laterality: N/A;  VERTEBROPLASTY WITH LUMBAR THREE BIOPSY    SOCIAL HISTORY: Social History   Social History  . Marital status: Single    Spouse name: N/A  . Number of children: N/A  . Years of education: N/A   Occupational History  . Clerk [employer]   Social History Main Topics  . Smoking status: Never Smoker  . Smokeless tobacco: Never Used  . Alcohol use 0.0 oz/week     Comment: social occ  . Drug use: No  . Sexual activity: Not on file   Other Topics Concern  . Not on file   Social History Narrative  . No narrative on file    FAMILY HISTORY: Family History  Problem Relation Age of Onset  . Breast cancer Sister 34  . Breast cancer Other 76       aunt    Review of Systems  Review of Systems  Constitutional: Negative for fatigue.  HENT:         Continued sinus issues   Eyes: Negative.   Respiratory: Negative.   Cardiovascular: Negative.   Gastrointestinal: Positive for constipation and vomiting (  due to hiatal hernia if she eats too mcuh). Negative for abdominal pain.  Endocrine: Negative for hot flashes.  Genitourinary: Negative.    Musculoskeletal: Positive for back pain.       Muscle cramps in left thigh  Skin: Negative.   Neurological: Negative.  Negative for headaches.  Hematological: Negative.   Psychiatric/Behavioral: Positive for sleep disturbance.      PHYSICAL EXAMINATION  ECOG PERFORMANCE STATUS: 1    Physical Exam  Constitutional: She is oriented to person, place, and time and well-developed, well-nourished, and in no distress. Well groomed; Accompanied by her friend.  HENT:  Head: Normocephalic and atraumatic.  Nose: Nose normal.    Mouth/Throat: Oropharynx is clear and moist.  Eyes: Conjunctivae normal. Right eye exhibits no discharge. Left eye exhibits no discharge. No scleral icterus.  Neck: Normal range of motion. Neck supple. No tracheal deviation present. No thyromegaly present.  Cardiovascular: Normal rate, regular rhythm. Pulmonary/Chest: Effort normal and breath sounds normal. She has no wheezes. She has no rales.  Abdominal: Soft. Bowel sounds are normal. She exhibits no distension and no mass.  Musculoskeletal: She exhibits no edema.  Lymphadenopathy:    She has no cervical, supraclavicular, or infraclavicular adenopathy.  Neurological: She is alert and oriented to person, place, and time. No focal deficits.   Skin: Skin is warm and dry. No rash noted.  Psychiatric: Mood, memory, affect and judgment normal.     LABORATORY DATA: I have reviewed the labs below.  CBC Latest Ref Rng & Units 10/20/2016 02/15/2016 08/10/2015  WBC 4.0 - 10.5 K/uL 7.0 6.4 6.0  Hemoglobin 12.0 - 15.0 g/dL 13.1 13.1 13.6  Hematocrit 36.0 - 46.0 % 40.0 40.9 41.5  Platelets 150 - 400 K/uL 320 249 242   CMP Latest Ref Rng & Units 10/20/2016 02/15/2016 08/10/2015  Glucose 65 - 99 mg/dL 90 106(H) 96  BUN 6 - 20 mg/dL '12 16 17  ' Creatinine 0.44 - 1.00 mg/dL 0.89 0.74 0.74  Sodium 135 - 145 mmol/L 138 138 138  Potassium 3.5 - 5.1 mmol/L 4.3 3.7 3.8  Chloride 101 - 111 mmol/L 105 108 107  CO2 22 - 32 mmol/L '28 25 26  ' Calcium 8.9 - 10.3 mg/dL 9.8 9.5 9.8  Total Protein 6.5 - 8.1 g/dL 7.2 6.9 7.0  Total Bilirubin 0.3 - 1.2 mg/dL 0.8 0.8 0.9  Alkaline Phos 38 - 126 U/L 60 61 59  AST 15 - 41 U/L '26 22 18  ' ALT 14 - 54 U/L '15 15 14    ' ASSESSMENT and THERAPY PLAN:   1. Plasmacytoma Normal bone marrow biopsy No M spike  She received radiation to the lumbar spine. Bone marrow biopsy has been performed showed no evidence of a plasma cell neoplasm. Bone marrow biopsy has normal cytogenetics. Kappa lambda light chain ratio has been  normal, SPEP/IEP has been normal.   Clinically NED without evidence of progression to MM. I recommended ongoing observation and monitoring of laboratory studies. Will repeat her myeloma labs on her next visit.   2. Nausea/vomiting  I have recommended for her to see her gastroenterologist Dr. Laural Golden ASAP for further evaluation. I have made a new referral today. She also needs a screening colonoscopy.   Dispo:  -Return to cancer center in 6 months with repeat lab work; ordered today.  Orders Placed This Encounter  Procedures  . Multiple Myeloma Panel (SPEP&IFE w/QIG)    Standing Status:   Future    Standing Expiration Date:   10/20/2017  .  Kappa/lambda light chains    Standing Status:   Future    Standing Expiration Date:   10/20/2017  . Beta 2 microglobuline, serum    Standing Status:   Future    Standing Expiration Date:   10/20/2017  . CBC with Differential    Standing Status:   Future    Standing Expiration Date:   10/20/2017  . Comprehensive metabolic panel    Standing Status:   Future    Standing Expiration Date:   10/20/2017    Twana First, MD

## 2016-10-24 ENCOUNTER — Ambulatory Visit (INDEPENDENT_AMBULATORY_CARE_PROVIDER_SITE_OTHER): Payer: PPO | Admitting: Internal Medicine

## 2016-11-18 DIAGNOSIS — J209 Acute bronchitis, unspecified: Secondary | ICD-10-CM | POA: Diagnosis not present

## 2016-11-18 DIAGNOSIS — R05 Cough: Secondary | ICD-10-CM | POA: Diagnosis not present

## 2016-12-01 ENCOUNTER — Other Ambulatory Visit: Payer: Self-pay | Admitting: Obstetrics & Gynecology

## 2016-12-01 DIAGNOSIS — Z1231 Encounter for screening mammogram for malignant neoplasm of breast: Secondary | ICD-10-CM

## 2016-12-05 DIAGNOSIS — Z299 Encounter for prophylactic measures, unspecified: Secondary | ICD-10-CM | POA: Diagnosis not present

## 2016-12-05 DIAGNOSIS — R350 Frequency of micturition: Secondary | ICD-10-CM | POA: Diagnosis not present

## 2016-12-05 DIAGNOSIS — J019 Acute sinusitis, unspecified: Secondary | ICD-10-CM | POA: Diagnosis not present

## 2016-12-05 DIAGNOSIS — J3489 Other specified disorders of nose and nasal sinuses: Secondary | ICD-10-CM | POA: Diagnosis not present

## 2016-12-05 DIAGNOSIS — I10 Essential (primary) hypertension: Secondary | ICD-10-CM | POA: Diagnosis not present

## 2016-12-05 DIAGNOSIS — Z6828 Body mass index (BMI) 28.0-28.9, adult: Secondary | ICD-10-CM | POA: Diagnosis not present

## 2016-12-26 ENCOUNTER — Ambulatory Visit: Payer: PPO

## 2016-12-29 ENCOUNTER — Encounter: Payer: Self-pay | Admitting: Gastroenterology

## 2017-02-06 ENCOUNTER — Ambulatory Visit: Payer: PPO

## 2017-02-15 ENCOUNTER — Other Ambulatory Visit (HOSPITAL_COMMUNITY): Payer: BLUE CROSS/BLUE SHIELD

## 2017-02-16 DIAGNOSIS — Z01419 Encounter for gynecological examination (general) (routine) without abnormal findings: Secondary | ICD-10-CM | POA: Diagnosis not present

## 2017-02-16 DIAGNOSIS — Z6829 Body mass index (BMI) 29.0-29.9, adult: Secondary | ICD-10-CM | POA: Diagnosis not present

## 2017-02-16 DIAGNOSIS — Z124 Encounter for screening for malignant neoplasm of cervix: Secondary | ICD-10-CM | POA: Diagnosis not present

## 2017-03-13 ENCOUNTER — Ambulatory Visit: Payer: PPO

## 2017-03-14 ENCOUNTER — Telehealth (HOSPITAL_COMMUNITY): Payer: Self-pay

## 2017-03-14 DIAGNOSIS — C9030 Solitary plasmacytoma not having achieved remission: Secondary | ICD-10-CM

## 2017-03-14 NOTE — Telephone Encounter (Signed)
Patient called stating she has not been feeling well and has no energy. She states she has had Iron in the past and wants to know if she needs it again. Reviewed with provider. Explained to patient she could get her labs checked and then we can let her know the results and whether or not she needs Iron. Lab appointment made for Thursday, 03/15/16 at 1110. Patient verbalized understanding.

## 2017-03-15 ENCOUNTER — Inpatient Hospital Stay (HOSPITAL_COMMUNITY): Payer: PPO | Attending: Oncology

## 2017-03-15 DIAGNOSIS — C9030 Solitary plasmacytoma not having achieved remission: Secondary | ICD-10-CM

## 2017-03-15 DIAGNOSIS — D509 Iron deficiency anemia, unspecified: Secondary | ICD-10-CM | POA: Diagnosis not present

## 2017-03-15 LAB — CBC WITH DIFFERENTIAL/PLATELET
Basophils Absolute: 0.1 10*3/uL (ref 0.0–0.1)
Basophils Relative: 1 %
Eosinophils Absolute: 0.2 10*3/uL (ref 0.0–0.7)
Eosinophils Relative: 3 %
HCT: 41.2 % (ref 36.0–46.0)
Hemoglobin: 12.9 g/dL (ref 12.0–15.0)
Lymphocytes Relative: 23 %
Lymphs Abs: 1.4 10*3/uL (ref 0.7–4.0)
MCH: 26 pg (ref 26.0–34.0)
MCHC: 31.3 g/dL (ref 30.0–36.0)
MCV: 83.1 fL (ref 78.0–100.0)
Monocytes Absolute: 0.3 10*3/uL (ref 0.1–1.0)
Monocytes Relative: 5 %
Neutro Abs: 4.2 10*3/uL (ref 1.7–7.7)
Neutrophils Relative %: 68 %
Platelets: 319 10*3/uL (ref 150–400)
RBC: 4.96 MIL/uL (ref 3.87–5.11)
RDW: 13.8 % (ref 11.5–15.5)
WBC: 6.2 10*3/uL (ref 4.0–10.5)

## 2017-03-15 LAB — IRON AND TIBC
Iron: 71 ug/dL (ref 28–170)
Saturation Ratios: 18 % (ref 10.4–31.8)
TIBC: 388 ug/dL (ref 250–450)
UIBC: 317 ug/dL

## 2017-03-15 LAB — FERRITIN: Ferritin: 17 ng/mL (ref 11–307)

## 2017-03-22 ENCOUNTER — Encounter (HOSPITAL_COMMUNITY): Payer: Self-pay

## 2017-03-22 ENCOUNTER — Inpatient Hospital Stay (HOSPITAL_COMMUNITY): Payer: PPO

## 2017-03-22 ENCOUNTER — Other Ambulatory Visit: Payer: Self-pay

## 2017-03-22 VITALS — BP 121/68 | HR 75 | Temp 97.7°F | Resp 22 | Wt 156.6 lb

## 2017-03-22 DIAGNOSIS — D508 Other iron deficiency anemias: Secondary | ICD-10-CM

## 2017-03-22 DIAGNOSIS — D509 Iron deficiency anemia, unspecified: Secondary | ICD-10-CM | POA: Diagnosis not present

## 2017-03-22 MED ORDER — SODIUM CHLORIDE 0.9% FLUSH: 10.0000 mL | INTRAVENOUS | Status: DC | PRN

## 2017-03-22 MED ORDER — SODIUM CHLORIDE 0.9 % IV SOLN
Freq: Once | INTRAVENOUS | Status: AC
  Administered 2017-03-22: 12:00:00 via INTRAVENOUS

## 2017-03-22 MED ORDER — SODIUM CHLORIDE 0.9 % IV SOLN
510.0000 mg | Freq: Once | INTRAVENOUS | Status: AC
  Administered 2017-03-22: 510 mg via INTRAVENOUS
  Filled 2017-03-22: qty 17

## 2017-03-22 MED ORDER — POLYSACCHAR IRON-FA-B12 150-1-25 MG-MG-MCG PO CAPS: 1.0000 | ORAL_CAPSULE | Freq: Every day | ORAL | 6 refills | Status: DC

## 2017-03-22 NOTE — Progress Notes (Signed)
Tolerated infusion w/o adverse reaction.  Alert, in no distress.  VSS.  Discharged ambulatory.  

## 2017-03-29 DIAGNOSIS — Z6829 Body mass index (BMI) 29.0-29.9, adult: Secondary | ICD-10-CM | POA: Diagnosis not present

## 2017-03-29 DIAGNOSIS — R030 Elevated blood-pressure reading, without diagnosis of hypertension: Secondary | ICD-10-CM | POA: Diagnosis not present

## 2017-03-29 DIAGNOSIS — M4626 Osteomyelitis of vertebra, lumbar region: Secondary | ICD-10-CM | POA: Diagnosis not present

## 2017-04-03 ENCOUNTER — Ambulatory Visit
Admission: RE | Admit: 2017-04-03 | Discharge: 2017-04-03 | Disposition: A | Discharge status: Home / Self Care | Payer: PPO | Source: Ambulatory Visit | Attending: Obstetrics & Gynecology | Admitting: Obstetrics & Gynecology

## 2017-04-03 DIAGNOSIS — Z1231 Encounter for screening mammogram for malignant neoplasm of breast: Secondary | ICD-10-CM | POA: Diagnosis not present

## 2017-04-05 ENCOUNTER — Ambulatory Visit (HOSPITAL_COMMUNITY): Payer: PPO

## 2017-04-05 ENCOUNTER — Other Ambulatory Visit (HOSPITAL_COMMUNITY): Payer: PPO

## 2017-04-17 ENCOUNTER — Other Ambulatory Visit (HOSPITAL_COMMUNITY): Payer: PPO

## 2017-04-24 ENCOUNTER — Ambulatory Visit (HOSPITAL_COMMUNITY): Payer: PPO | Admitting: Internal Medicine

## 2017-05-03 ENCOUNTER — Other Ambulatory Visit (HOSPITAL_COMMUNITY): Payer: PPO

## 2017-05-03 ENCOUNTER — Ambulatory Visit (HOSPITAL_COMMUNITY): Payer: PPO

## 2017-05-29 DIAGNOSIS — H1045 Other chronic allergic conjunctivitis: Secondary | ICD-10-CM | POA: Diagnosis not present

## 2017-05-29 DIAGNOSIS — Z961 Presence of intraocular lens: Secondary | ICD-10-CM | POA: Diagnosis not present

## 2017-05-29 DIAGNOSIS — H02889 Meibomian gland dysfunction of unspecified eye, unspecified eyelid: Secondary | ICD-10-CM | POA: Diagnosis not present

## 2017-05-29 DIAGNOSIS — H1032 Unspecified acute conjunctivitis, left eye: Secondary | ICD-10-CM | POA: Diagnosis not present

## 2017-05-30 DIAGNOSIS — Z299 Encounter for prophylactic measures, unspecified: Secondary | ICD-10-CM | POA: Diagnosis not present

## 2017-05-30 DIAGNOSIS — J069 Acute upper respiratory infection, unspecified: Secondary | ICD-10-CM | POA: Diagnosis not present

## 2017-05-30 DIAGNOSIS — Z713 Dietary counseling and surveillance: Secondary | ICD-10-CM | POA: Diagnosis not present

## 2017-05-30 DIAGNOSIS — Z6828 Body mass index (BMI) 28.0-28.9, adult: Secondary | ICD-10-CM | POA: Diagnosis not present

## 2017-06-06 ENCOUNTER — Other Ambulatory Visit (HOSPITAL_COMMUNITY): Payer: Self-pay | Admitting: *Deleted

## 2017-06-06 DIAGNOSIS — C9000 Multiple myeloma not having achieved remission: Secondary | ICD-10-CM

## 2017-06-07 ENCOUNTER — Encounter (HOSPITAL_COMMUNITY): Payer: Self-pay | Admitting: Internal Medicine

## 2017-06-07 ENCOUNTER — Inpatient Hospital Stay (HOSPITAL_COMMUNITY): Payer: PPO | Attending: Internal Medicine

## 2017-06-07 ENCOUNTER — Other Ambulatory Visit: Payer: Self-pay

## 2017-06-07 ENCOUNTER — Inpatient Hospital Stay (HOSPITAL_COMMUNITY): Payer: PPO | Attending: Internal Medicine | Admitting: Internal Medicine

## 2017-06-07 ENCOUNTER — Inpatient Hospital Stay (HOSPITAL_COMMUNITY): Payer: PPO

## 2017-06-07 VITALS — BP 133/54 | HR 52 | Temp 97.6°F | Resp 18 | Ht 61.5 in | Wt 154.0 lb

## 2017-06-07 DIAGNOSIS — C9030 Solitary plasmacytoma not having achieved remission: Secondary | ICD-10-CM | POA: Diagnosis not present

## 2017-06-07 DIAGNOSIS — R112 Nausea with vomiting, unspecified: Secondary | ICD-10-CM | POA: Insufficient documentation

## 2017-06-07 DIAGNOSIS — I10 Essential (primary) hypertension: Secondary | ICD-10-CM | POA: Diagnosis not present

## 2017-06-07 DIAGNOSIS — C9000 Multiple myeloma not having achieved remission: Secondary | ICD-10-CM

## 2017-06-07 LAB — CBC WITH DIFFERENTIAL/PLATELET
Basophils Absolute: 0 10*3/uL (ref 0.0–0.1)
Basophils Relative: 1 %
Eosinophils Absolute: 0.2 10*3/uL (ref 0.0–0.7)
Eosinophils Relative: 4 %
HCT: 43.9 % (ref 36.0–46.0)
Hemoglobin: 14.2 g/dL (ref 12.0–15.0)
Lymphocytes Relative: 42 %
Lymphs Abs: 2.1 10*3/uL (ref 0.7–4.0)
MCH: 27.5 pg (ref 26.0–34.0)
MCHC: 32.3 g/dL (ref 30.0–36.0)
MCV: 85.1 fL (ref 78.0–100.0)
Monocytes Absolute: 0.3 10*3/uL (ref 0.1–1.0)
Monocytes Relative: 6 %
Neutro Abs: 2.4 10*3/uL (ref 1.7–7.7)
Neutrophils Relative %: 47 %
Platelets: 219 10*3/uL (ref 150–400)
RBC: 5.16 MIL/uL — ABNORMAL HIGH (ref 3.87–5.11)
RDW: 14.1 % (ref 11.5–15.5)
WBC: 5 10*3/uL (ref 4.0–10.5)

## 2017-06-07 LAB — COMPREHENSIVE METABOLIC PANEL
ALT: 14 U/L (ref 14–54)
AST: 22 U/L (ref 15–41)
Albumin: 3.9 g/dL (ref 3.5–5.0)
Alkaline Phosphatase: 50 U/L (ref 38–126)
Anion gap: 10 (ref 5–15)
BUN: 14 mg/dL (ref 6–20)
CO2: 25 mmol/L (ref 22–32)
Calcium: 10 mg/dL (ref 8.9–10.3)
Chloride: 106 mmol/L (ref 101–111)
Creatinine, Ser: 0.75 mg/dL (ref 0.44–1.00)
GFR calc Af Amer: >60 mL/min (ref >60)
GFR calc non Af Amer: >60 mL/min (ref >60)
Glucose, Bld: 126 mg/dL — ABNORMAL HIGH (ref 65–99)
Potassium: 3.8 mmol/L (ref 3.5–5.1)
Sodium: 141 mmol/L (ref 135–145)
Total Bilirubin: 0.7 mg/dL (ref 0.3–1.2)
Total Protein: 7 g/dL (ref 6.5–8.1)

## 2017-06-07 NOTE — Progress Notes (Signed)
Diagnosis 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[place]) - Plan: DG Bone Density, DG Bone Survey Met, CBC with Differential/Platelet, Comprehensive metabolic panel, Lactate dehydrogenase, Protein electrophoresis, serum, Kappa/lambda light chains, Beta 2 microglobuline, serum, IgG, IgA, IgM, Ferritin  Staging Cancer Staging No matching staging information was found for the patient.  Assessment and Plan: 1. Plasmacytoma.  Originally followed by Dr. Talbert Cage.  She had L3 biopsy done on 07/14/2013 that showed increased plasma cells.  She received radiation to the lumbar spine. Bone marrow biopsy has been performed showed no evidence of a plasma cell neoplasm. Bone marrow biopsy has normal cytogenetics. Kappa lambda light chain ratio has been normal, SPEP/IEP has been normal.   Labs done 06/07/2017 showed hemoglobin 14.2.  Creatinine 0.75.  SPEP that was done December 2018 was negative.  The patient received 1 dose of Reclast in 2016.  She was questioning if she should have ongoing therapy.  I discussed with her she will undergo bone density and skeletal survey before any additional therapy with Reclast is recommended.  She remains on observation and is 4 years out from her diagnosis.  She will return to clinic in October 2019 for repeat SPEP, free light chains, quantitative immunoglobulins, CBC, chemistries.  2. Nausea/vomiting.  She was requesting a prescription for Phenergan.  She had previously been referred to Dr. Laural Golden of GI but has not seen him.  She will be rescheduled for follow-up with Dr. Laural Golden of GI for ongoing management.  3.  Request for Valium and ibuprofen.  I discussed with her she would have to follow-up with PCP or psychiatry for medication.  Tylenol or ibuprofen are over-the-counter medications.  4.  Hypertension.  Blood pressure is 133/54.  Follow-up with PCP.  5.  Health maintenance.  She is referred to GI for evaluation.  Continue mammograms as directed.   Current Status: Patient is seen today  for follow-up to go over labs.  She was requesting Valium and Phenergan and ibuprofen.  She has not seen GI as previously recommended.   Problem List Patient Active Problem List   Diagnosis Date Noted  . Nausea with vomiting [R11.2] 10/12/2014  . MRSA infection [A49.02]   . Osteomyelitis of lumbar spine ([facility]) [M46.26] 08/13/2014  . Acute low back pain [M54.5] 08/13/2014  . Epidural abscess [G06.2] 08/13/2014  . Hypertension [I10]   . Chronic back pain [M54.9, G89.29]   . Essential hypertension [I10]   . Other iron deficiency anemia [D50.8] 04/10/2014  . 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[place]) [C90.30] 03/23/2014  . Right hip pain [M25.551] 12/23/2013  . Epidural abscess, L2-L5 [G06.1] 11/04/2013  . Sepsis ([place]) [A41.9] 09/21/2013  . Hypercalcemia [E83.52] 08/20/2013  . Hypokalemia [E87.6] 08/20/2013  . Lumbar compression fracture ([facility]) [S32.000A] 07/12/2013  . Compression fracture [IMO0002] 07/12/2013  . Benign neoplasm of colon [D12.6] 12/18/2011  . Esophageal reflux [K21.9] 12/18/2011  . Other dysphagia [R13.19] 12/18/2011    Past Medical History Past Medical History:  Diagnosis Date  . Bronchitis, chronic ([place] [place])   . Chronic back pain   . Chronic kidney disease    kidney stone s/p stent placement ( removed)  . Complication of anesthesia   . Compression fracture of L3 lumbar vertebra   . Diverticulosis   . GERD (gastroesophageal reflux disease)   . Hemorrhoids   . Hiatal hernia   . Hx of adenomatous colonic polyps   . Hyperlipidemia   . Hypertension   . Lumbar radicular pain   . PONV (postoperative nausea and vomiting)   . Smoldering myeloma (  HCC)   . Sore throat   . T7 vertebral fracture [facility])     Past Surgical History Past Surgical History:  Procedure Laterality Date  . BACK SURGERY    . BONE MARROW ASPIRATION Left 02/2014  . BONE MARROW BIOPSY Left 02/2014  . CHOLECYSTECTOMY    . COLONOSCOPY  12/18/2011   Procedure: COLONOSCOPY;  Surgeon: Lafayette Dragon, MD;  Location:  [facility];  Service: Endoscopy;  Laterality: N/A;  . ESOPHAGOGASTRODUODENOSCOPY  12/18/2011   Procedure: ESOPHAGOGASTRODUODENOSCOPY (EGD);  Surgeon: Lafayette Dragon, MD;  Location: [facility];  Service: Endoscopy;  Laterality: N/A;  . LUMBAR LAMINECTOMY FOR EPIDURAL ABSCESS Left 09/22/2013   Procedure: LUMBAR LAMINECTOMY FOR EPIDURAL ABSCESS LEFT LUMBAR FIVE-SACRAL ONE;  Surgeon: Charlie Pitter, MD;  Location: [place] [facility];  Service: Neurosurgery;  Laterality: Left;  . RADIOLOGY WITH ANESTHESIA N/A 08/13/2014   Procedure: RADIOLOGY WITH ANESTHESIA ;  Surgeon: Medication Radiologist, MD;  Location: [facility] [facility];  Service: Radiology;  Laterality: N/A;  . URETERAL STENT PLACEMENT    . VERTEBROPLASTY N/A 07/14/2013   Procedure: VERTEBROPLASTY WITH LUMBAR THREE BIOPSY;  Surgeon: Charlie Pitter, MD;  Location: [facility];  Service: Neurosurgery;  Laterality: N/A;  VERTEBROPLASTY WITH LUMBAR THREE BIOPSY    Family History Family History  Problem Relation Age of Onset  . Breast cancer Sister 22  . Breast cancer Other 19       aunt     Social History  reports that she has never smoked. She has never used smokeless tobacco. She reports that she drinks alcohol. She reports that she does not use drugs.  Medications  Current Outpatient Medications:  .  diazepam (VALIUM) 5 MG tablet, Take 1 tablet (5 mg total) by mouth every 8 (eight) hours as needed for anxiety., Disp: 80 tablet, Rfl: 0 .  ibuprofen (ADVIL,MOTRIN) 800 MG tablet, Take 1 tablet (800 mg total) by mouth every 8 (eight) hours as needed., Disp: 30 tablet, Rfl: 0 .  oxyCODONE (OXYCONTIN) 20 mg 12 hr tablet, Take by mouth., Disp: , Rfl:  .  Polysacchar Iron-FA-B12 (FERREX 150 FORTE) 150-1-25 MG-MG-MCG CAPS, Take 1 tablet by mouth daily., Disp: 30 capsule, Rfl: 6 .  promethazine (PHENERGAN) 12.5 MG tablet, Take 1 tablet (12.5 mg total) by mouth every 6 (six) hours as needed for nausea or vomiting., Disp: 30 tablet, Rfl: 11 .  RABEprazole  (ACIPHEX) 20 MG tablet, Take 1 tablet (20 mg total) by mouth daily., Disp: 30 tablet, Rfl: 3 .  zoledronic acid (RECLAST) 5 MG/100ML SOLN injection, Inject 5 mg into the vein once., Disp: , Rfl:  .  fluorometholone (FML) 0.1 % ophthalmic suspension, INSTILL ONE DROP IN EACH EYE 2-4 TIMES DAILY AS NEEDED FOR ALLERGIES, Disp: , Rfl: 1 .  HYDROcodone-homatropine (HYCODAN) 5-1.5 MG/5ML syrup, take ONE teaspoonful BY MOUTH EVERY TWELVE HOURS AS NEEDED, Disp: , Rfl: 0  Allergies Morphine and related and Tetracycline  Review of Systems Review of Systems - Oncology ROS as per HPI otherwise 12 point ROS is negative.   Physical Exam  Vitals Wt Readings from Last 3 Encounters:  06/07/17 154 lb (69.9 kg)  03/22/17 156 lb 9.6 oz (71 kg)  10/20/16 159 lb 3.2 oz (72.2 kg)   Temp Readings from Last 3 Encounters:  06/07/17 97.6 F (36.4 C)  03/22/17 97.7 F (36.5 C) (Oral)  10/20/16 98.1 F (36.7 C) (Oral)   BP Readings from Last 3 Encounters:  06/07/17 (!) 133/54  03/22/17 121/68  10/20/16 110/79   Pulse Readings from Last 3 Encounters:  06/07/17 (!) 52  03/22/17 75  10/20/16 65   Constitutional: Well-developed, well-nourished, and in no distress.   HENT: Head: Normocephalic and atraumatic.  Mouth/Throat: No oropharyngeal exudate. Mucosa moist. Eyes: Pupils are equal, round, and reactive to light. Conjunctivae are normal. No scleral icterus.  Neck: Normal range of motion. Neck supple. No JVD present.  Cardiovascular: Normal rate, regular rhythm and normal heart sounds.  Exam reveals no gallop and no friction rub.   No murmur heard. Pulmonary/Chest: Effort normal and breath sounds normal. No respiratory distress. No wheezes.No rales.  Abdominal: Soft. Bowel sounds are normal. No distension. There is no tenderness. There is no guarding.  Musculoskeletal: No edema or tenderness.  Lymphadenopathy: No cervical, axillary or supraclavicular adenopathy.  Neurological: Alert and oriented to  person, place, and time. No cranial nerve deficit.  Skin: Skin is warm and dry. No rash noted. No erythema. No pallor.  Psychiatric: Affect and judgment normal.   Labs Appointment on 06/07/2017  Component Date Value Ref Range Status  . WBC 06/07/2017 5.0  4.0 - 10.5 K/uL Final  . RBC 06/07/2017 5.16* 3.87 - 5.11 MIL/uL Final  . Hemoglobin 06/07/2017 14.2  12.0 - 15.0 g/dL Final  . HCT 06/07/2017 43.9  36.0 - 46.0 % Final  . MCV 06/07/2017 85.1  78.0 - 100.0 fL Final  . MCH 06/07/2017 27.5  26.0 - 34.0 pg Final  . MCHC 06/07/2017 32.3  30.0 - 36.0 g/dL Final  . RDW 06/07/2017 14.1  11.5 - 15.5 % Final  . Platelets 06/07/2017 219  150 - 400 K/uL Final  . Neutrophils Relative % 06/07/2017 47  % Final  . Neutro Abs 06/07/2017 2.4  1.7 - 7.7 K/uL Final  . Lymphocytes Relative 06/07/2017 42  % Final  . Lymphs Abs 06/07/2017 2.1  0.7 - 4.0 K/uL Final  . Monocytes Relative 06/07/2017 6  % Final  . Monocytes Absolute 06/07/2017 0.3  0.1 - 1.0 K/uL Final  . Eosinophils Relative 06/07/2017 4  % Final  . Eosinophils Absolute 06/07/2017 0.2  0.0 - 0.7 K/uL Final  . Basophils Relative 06/07/2017 1  % Final  . Basophils Absolute 06/07/2017 0.0  0.0 - 0.1 K/uL Final   Performed at [facility], [address], [place], [place] [address]  . Sodium 06/07/2017 141  135 - 145 mmol/L Final  . Potassium 06/07/2017 3.8  3.5 - 5.1 mmol/L Final  . Chloride 06/07/2017 106  101 - 111 mmol/L Final  . CO2 06/07/2017 25  22 - 32 mmol/L Final  . Glucose, Bld 06/07/2017 126* 65 - 99 mg/dL Final  . BUN 06/07/2017 14  6 - 20 mg/dL Final  . Creatinine, Ser 06/07/2017 0.75  0.44 - 1.00 mg/dL Final  . Calcium 06/07/2017 10.0  8.9 - 10.3 mg/dL Final  . Total Protein 06/07/2017 7.0  6.5 - 8.1 g/dL Final  . Albumin 06/07/2017 3.9  3.5 - 5.0 g/dL Final  . AST 06/07/2017 22  15 - 41 U/L Final  . ALT 06/07/2017 14  14 - 54 U/L Final  . Alkaline Phosphatase 06/07/2017 50  38 - 126 U/L Final  . Total Bilirubin 06/07/2017  0.7  0.3 - 1.2 mg/dL Final  . GFR calc non Af Amer 06/07/2017 >60  >60 mL/min Final  . GFR calc Af Amer 06/07/2017 >60  >60 mL/min Final   Comment: (NOTE) The eGFR has been calculated using the CKD EPI equation.  This calculation has not been validated in all clinical situations. eGFR's persistently <60 mL/min signify possible Chronic Kidney Disease.   Georgiann Hahn gap 06/07/2017 10  5 - 15 Final   Performed at [facility], [address], [place], [place] [address]     Pathology Orders Placed This Encounter  Procedures  . DG Bone Density    Standing Status:   Future    Standing Expiration Date:   06/07/2018    Order Specific Question:   Reason for Exam (SYMPTOM  OR DIAGNOSIS REQUIRED)    Answer:   postmenopausal evaluation    Order Specific Question:   Preferred imaging location?    Answer:   [facility] Bone Survey Met    Standing Status:   Future    Standing Expiration Date:   08/08/2018    Order Specific Question:   Reason for Exam (SYMPTOM  OR DIAGNOSIS REQUIRED)    Answer:   plasmacytoma    Order Specific Question:   Preferred imaging location?    Answer:   [facility]    Order Specific Question:   Radiology Contrast Protocol - do NOT remove file path    Answer:   [email or web address]  . CBC with Differential/Platelet    Standing Status:   Future    Standing Expiration Date:   06/08/2018  . Comprehensive metabolic panel    Standing Status:   Future    Standing Expiration Date:   06/08/2018  . Lactate dehydrogenase    Standing Status:   Future    Standing Expiration Date:   06/08/2018  . Protein electrophoresis, serum    Standing Status:   Future    Standing Expiration Date:   06/08/2018  . Kappa/lambda light chains    Standing Status:   Future    Standing Expiration Date:   06/08/2018  . Beta 2 microglobuline, serum    Standing Status:   Future    Standing Expiration Date:   06/08/2018  . IgG, IgA, IgM    Standing  Status:   Future    Standing Expiration Date:   06/08/2018  . Ferritin    Standing Status:   Future    Standing Expiration Date:   06/08/2018       Zoila Shutter MD

## 2017-06-07 NOTE — Progress Notes (Signed)
Reclast infusion cancelled for today per Dr. Walden Field

## 2017-06-08 LAB — KAPPA/LAMBDA LIGHT CHAINS
Kappa free light chain: 22.3 mg/L — ABNORMAL HIGH (ref 3.3–19.4)
Kappa, lambda light chain ratio: 0.77 (ref 0.26–1.65)
Lambda free light chains: 28.8 mg/L — ABNORMAL HIGH (ref 5.7–26.3)

## 2017-06-08 LAB — BETA 2 MICROGLOBULIN, SERUM: Beta-2 Microglobulin: 1.9 mg/L (ref 0.6–2.4)

## 2017-06-11 LAB — MULTIPLE MYELOMA PANEL, SERUM
Albumin SerPl Elph-Mcnc: 3.5 g/dL (ref 2.9–4.4)
Albumin/Glob SerPl: 1.3 (ref 0.7–1.7)
Alpha 1: 0.2 g/dL (ref 0.0–0.4)
Alpha2 Glob SerPl Elph-Mcnc: 0.6 g/dL (ref 0.4–1.0)
B-Globulin SerPl Elph-Mcnc: 0.9 g/dL (ref 0.7–1.3)
Gamma Glob SerPl Elph-Mcnc: 1.1 g/dL (ref 0.4–1.8)
Globulin, Total: 2.9 g/dL (ref 2.2–3.9)
IgA: 218 mg/dL (ref 87–352)
IgG (Immunoglobin G), Serum: 1137 mg/dL (ref 700–1600)
IgM (Immunoglobulin M), Srm: 87 mg/dL (ref 26–217)
Total Protein ELP: 6.4 g/dL (ref 6.0–8.5)

## 2017-06-20 ENCOUNTER — Ambulatory Visit (INDEPENDENT_AMBULATORY_CARE_PROVIDER_SITE_OTHER): Payer: PPO | Admitting: Internal Medicine

## 2017-06-20 ENCOUNTER — Encounter (INDEPENDENT_AMBULATORY_CARE_PROVIDER_SITE_OTHER): Payer: Self-pay | Admitting: Internal Medicine

## 2017-06-20 VITALS — BP 170/80 | HR 64 | Temp 98.0°F | Ht 61.0 in | Wt 156.9 lb

## 2017-06-20 DIAGNOSIS — K219 Gastro-esophageal reflux disease without esophagitis: Secondary | ICD-10-CM

## 2017-06-20 DIAGNOSIS — R1310 Dysphagia, unspecified: Secondary | ICD-10-CM | POA: Diagnosis not present

## 2017-06-20 DIAGNOSIS — R1319 Other dysphagia: Secondary | ICD-10-CM

## 2017-06-20 NOTE — Progress Notes (Signed)
Subjective:    Patient ID: Autumn Duran, female    DOB: [birth year], 67 y.o.   MRN: [id number]  HPI Referred by Dr. Walden Field for dysphagia/GERD.  She tells me when she eats, it feels like foods are lodging. She has to vomit the bolus up. Symptoms for about a year. She eats several small meals a day. She has a BM every 3 days. No melena or BRRB. Her appetite is good. No weight loss.  She has nausea when the food lodges.  She underwent an EGD in 2013 for GERD and dysphagia;. Impression:  ENDOSCOPIC IMPRESSION:  Irregular z-line, mild nonobstructing stricture, no hiatal hernia, Maloney dilator 69 F passed without difficulty, biopsies taken from the g-e junction to r/o Barrett's  Her last colonoscopy was in 2013. Biopsy:Tubular adenoma and serrated adenomas (3). No high  Grade dysplasia or malignancy.   Colon polys (3). No adenomatous change or maligmancy   Review of Systems Past Medical History:  Diagnosis Date  . Bronchitis, chronic ([place])   . Chronic back pain   . Chronic kidney disease    kidney stone s/p stent placement ( removed)  . Complication of anesthesia   . Compression fracture of L3 lumbar vertebra   . Diverticulosis   . GERD (gastroesophageal reflux disease)   . Hemorrhoids   . Hiatal hernia   . Hx of adenomatous colonic polyps   . Hyperlipidemia   . Hypertension   . Lumbar radicular pain   . PONV (postoperative nausea and vomiting)   . Smoldering myeloma ([place])   . Sore throat   . T7 vertebral fracture [facility])     Past Surgical History:  Procedure Laterality Date  . BACK SURGERY    . BONE MARROW ASPIRATION Left 02/2014  . BONE MARROW BIOPSY Left 02/2014  . CHOLECYSTECTOMY    . COLONOSCOPY  12/18/2011   Procedure: COLONOSCOPY;  Surgeon: Lafayette Dragon, MD;  Location: [facility] ENDOSCOPY;  Service: Endoscopy;  Laterality: N/A;  . ESOPHAGOGASTRODUODENOSCOPY  12/18/2011   Procedure: ESOPHAGOGASTRODUODENOSCOPY (EGD);  Surgeon: Lafayette Dragon, MD;  Location: [facility];   Service: Endoscopy;  Laterality: N/A;  . LUMBAR LAMINECTOMY FOR EPIDURAL ABSCESS Left 09/22/2013   Procedure: LUMBAR LAMINECTOMY FOR EPIDURAL ABSCESS LEFT LUMBAR FIVE-SACRAL ONE;  Surgeon: Charlie Pitter, MD;  Location: [place] [facility];  Service: Neurosurgery;  Laterality: Left;  . RADIOLOGY WITH ANESTHESIA N/A 08/13/2014   Procedure: RADIOLOGY WITH ANESTHESIA ;  Surgeon: Medication Radiologist, MD;  Location: [place] [facility];  Service: Radiology;  Laterality: N/A;  . URETERAL STENT PLACEMENT    . VERTEBROPLASTY N/A 07/14/2013   Procedure: VERTEBROPLASTY WITH LUMBAR THREE BIOPSY;  Surgeon: Charlie Pitter, MD;  Location: [place] [facility]UR[facility] OR[facility];  Service: Neurosurgery;  Laterality: N/A;  VERTEBROPLASTY WITH LUMBAR THREE BIOPSY    Allergies  Allergen Reactions  . Morphine And Related Other (See Comments)    Headache  . Tetracycline Nausea Only    Current Outpatient Medications on File Prior to Visit  Medication Sig Dispense Refill  . fluorometholone (FML) 0.1 % ophthalmic suspension INSTILL ONE DROP IN EACH EYE 2-4 TIMES DAILY AS NEEDED FOR ALLERGIES  1  . HYDROcodone-homatropine (HYCODAN) 5-1.5 MG/5ML syrup take ONE teaspoonful BY MOUTH EVERY TWELVE HOURS AS NEEDED  0  . ibuprofen (ADVIL,MOTRIN) 800 MG tablet Take 1 tablet (800 mg total) by mouth every 8 (eight) hours as needed. 30 tablet 0  . omeprazole (PRILOSEC) 20 MG capsule Take 20 mg by mouth 2 (two) times daily before  a meal.    . oxyCODONE (OXYCONTIN) 20 mg 12 hr tablet Take by mouth.    . Polysacchar Iron-FA-B12 (FERREX 150 FORTE) 150-1-25 MG-MG-MCG CAPS Take 1 tablet by mouth daily. 30 capsule 6   No current facility-administered medications on file prior to visit.         Objective:   Physical Exam Blood pressure (!) 170/80, pulse 64, temperature 98 F (36.7 C), height '5\' 1"'  (1.549 m), weight 156 lb 14.4 oz (71.2 kg). Alert and oriented. Skin warm and dry. Oral mucosa is moist.   . Sclera anicteric, conjunctivae is pink. Thyroid not enlarged. No  cervical lymphadenopathy. Lungs clear. Heart regular rate and rhythm.  Abdomen is soft. Bowel sounds are positive. No hepatomegaly. No abdominal masses felt. No tenderness.  No edema to lower extremities.           Assessment & Plan:  Dysphagia.   Hx of same. Hx of  Mild non obstructing stricture.  EGD/ED.  GERD diet given to patient.  Colon polyps: Last colonoscopy in 2013: Needs surveillance.

## 2017-06-20 NOTE — Patient Instructions (Addendum)
EGD with propofol.  The risks of bleeding, perforation and infection were reviewed with patient.  Food Choices for Gastroesophageal Reflux Disease, Adult When you have gastroesophageal reflux disease (GERD), the foods you eat and your eating habits are very important. Choosing the right foods can help ease your discomfort. What guidelines do I need to follow?  Choose fruits, vegetables, whole grains, and low-fat dairy products.  Choose low-fat meat, fish, and poultry.  Limit fats such as oils, salad dressings, butter, nuts, and avocado.  Keep a food diary. This helps you identify foods that cause symptoms.  Avoid foods that cause symptoms. These may be different for everyone.  Eat small meals often instead of 3 large meals a day.  Eat your meals slowly, in a place where you are relaxed.  Limit fried foods.  Cook foods using methods other than frying.  Avoid drinking alcohol.  Avoid drinking large amounts of liquids with your meals.  Avoid bending over or lying down until 2-3 hours after eating. What foods are not recommended? These are some foods and drinks that may make your symptoms worse: Vegetables Tomatoes. Tomato juice. Tomato and spaghetti sauce. Chili peppers. Onion and garlic. Horseradish. Fruits Oranges, grapefruit, and lemon (fruit and juice). Meats High-fat meats, fish, and poultry. This includes hot dogs, ribs, ham, sausage, salami, and bacon. Dairy Whole milk and chocolate milk. Sour cream. Cream. Butter. Ice cream. Cream cheese. Drinks Coffee and tea. Bubbly (carbonated) drinks or energy drinks. Condiments Hot sauce. Barbecue sauce. Sweets/Desserts Chocolate and cocoa. Donuts. Peppermint and spearmint. Fats and Oils High-fat foods. This includes [place] fries and potato chips. Other Vinegar. Strong spices. This includes black pepper, white pepper, red pepper, cayenne, curry powder, cloves, ginger, and chili powder. The items listed above may not be a  complete list of foods and drinks to avoid. Contact your dietitian for more information. This information is not intended to replace advice given to you by your health care provider. Make sure you discuss any questions you have with your health care provider. Document Released: 08/15/2011 Document Revised: 07/22/2015 Document Reviewed: 12/18/2012 Elsevier Interactive Patient Education  2017 [employer].

## 2017-06-21 ENCOUNTER — Ambulatory Visit (HOSPITAL_COMMUNITY)
Admission: RE | Admit: 2017-06-21 | Discharge: 2017-06-21 | Disposition: A | Discharge status: Home / Self Care | Payer: PPO | Source: Ambulatory Visit | Attending: Internal Medicine | Admitting: Internal Medicine

## 2017-06-21 DIAGNOSIS — Z780 Asymptomatic menopausal state: Secondary | ICD-10-CM | POA: Diagnosis not present

## 2017-06-21 DIAGNOSIS — M810 Age-related osteoporosis without current pathological fracture: Secondary | ICD-10-CM | POA: Diagnosis not present

## 2017-06-21 DIAGNOSIS — M818 Other osteoporosis without current pathological fracture: Secondary | ICD-10-CM | POA: Insufficient documentation

## 2017-06-21 DIAGNOSIS — C9030 Solitary plasmacytoma not having achieved remission: Secondary | ICD-10-CM | POA: Insufficient documentation

## 2017-06-26 ENCOUNTER — Other Ambulatory Visit (INDEPENDENT_AMBULATORY_CARE_PROVIDER_SITE_OTHER): Payer: Self-pay | Admitting: Internal Medicine

## 2017-06-26 DIAGNOSIS — Z86010 Personal history of colonic polyps: Secondary | ICD-10-CM | POA: Insufficient documentation

## 2017-06-26 DIAGNOSIS — R1310 Dysphagia, unspecified: Secondary | ICD-10-CM | POA: Insufficient documentation

## 2017-06-26 DIAGNOSIS — K219 Gastro-esophageal reflux disease without esophagitis: Secondary | ICD-10-CM | POA: Insufficient documentation

## 2017-06-26 NOTE — Patient Instructions (Signed)
Autumn Duran  06/26/2017     @PREFPERIOPPHARMACY @   Your procedure is scheduled on  07/02/2017   Report to [facility] at  30   A.M.  Call this number if you have problems the morning of surgery:  [phone]   Remember:  Do not eat food or drink liquids after midnight.  Take these medicines the morning of surgery with A SIP OF WATER  Prilosec, oxycodone.   Do not wear jewelry, make-up or nail polish.  Do not wear lotions, powders, or perfumes, or deodorant.  Do not shave 48 hours prior to surgery.  Men may shave face and neck.  Do not bring valuables to the hospital.  [facility] is not responsible for any belongings or valuables.  Contacts, dentures or bridgework may not be worn into surgery.  Leave your suitcase in the car.  After surgery it may be brought to your room.  For patients admitted to the hospital, discharge time will be determined by your treatment team.  Patients discharged the day of surgery will not be allowed to drive home.   Name and phone number of your driver:   family Special instructions:  Follow the diet and prep instructions given to you by Dr Olevia Perches office.  Please read over the following fact sheets that you were given. Anesthesia Post-op Instructions and Care and Recovery After Surgery       Esophagogastroduodenoscopy Esophagogastroduodenoscopy (EGD) is a procedure to examine the lining of the esophagus, stomach, and first part of the small intestine (duodenum). This procedure is done to check for problems such as inflammation, bleeding, ulcers, or growths. During this procedure, a long, flexible, lighted tube with a camera attached (endoscope) is inserted down the throat. Tell a health care provider about:  Any allergies you have.  All medicines you are taking, including vitamins, herbs, eye drops, creams, and over-the-counter medicines.  Any problems you or family members have had with anesthetic  medicines.  Any blood disorders you have.  Any surgeries you have had.  Any medical conditions you have.  Whether you are pregnant or may be pregnant. What are the risks? Generally, this is a safe procedure. However, problems may occur, including:  Infection.  Bleeding.  A tear (perforation) in the esophagus, stomach, or duodenum.  Trouble breathing.  Excessive sweating.  Spasms of the larynx.  A slowed heartbeat.  Low blood pressure.  What happens before the procedure?  Follow instructions from your health care provider about eating or drinking restrictions.  Ask your health care provider about: ? Changing or stopping your regular medicines. This is especially important if you are taking diabetes medicines or blood thinners. ? Taking medicines such as aspirin and ibuprofen. These medicines can thin your blood. Do not take these medicines before your procedure if your health care provider instructs you not to.  Plan to have someone take you home after the procedure.  If you wear dentures, be ready to remove them before the procedure. What happens during the procedure?  To reduce your risk of infection, your health care team will wash or sanitize their hands.  An IV tube will be put in a vein in your hand or arm. You will get medicines and fluids through this tube.  You will be given one or more of the following: ? A medicine to help you relax (sedative). ? A medicine to numb the area (  local anesthetic). This medicine may be sprayed into your throat. It will make you feel more comfortable and keep you from gagging or coughing during the procedure. ? A medicine for pain.  A mouth guard may be placed in your mouth to protect your teeth and to keep you from biting on the endoscope.  You will be asked to lie on your left side.  The endoscope will be lowered down your throat into your esophagus, stomach, and duodenum.  Air will be put into the endoscope. This will  help your health care provider see better.  The lining of your esophagus, stomach, and duodenum will be examined.  Your health care provider may: ? Take a tissue sample so it can be looked at in a lab (biopsy). ? Remove growths. ? Remove objects (foreign bodies) that are stuck. ? Treat any bleeding with medicines or other devices that stop tissue from bleeding. ? Widen (dilate) or stretch narrowed areas of your esophagus and stomach.  The endoscope will be taken out. The procedure may vary among health care providers and hospitals. What happens after the procedure?  Your blood pressure, heart rate, breathing rate, and blood oxygen level will be monitored often until the medicines you were given have worn off.  Do not eat or drink anything until the numbing medicine has worn off and your gag reflex has returned. This information is not intended to replace advice given to you by your health care provider. Make sure you discuss any questions you have with your health care provider. Document Released: 06/16/2004 Document Revised: 07/22/2015 Document Reviewed: 01/07/2015 Elsevier Interactive Patient Education  2018 [employer]. Esophagogastroduodenoscopy, Care After Refer to this sheet in the next few weeks. These instructions provide you with information about caring for yourself after your procedure. Your health care provider may also give you more specific instructions. Your treatment has been planned according to current medical practices, but problems sometimes occur. Call your health care provider if you have any problems or questions after your procedure. What can I expect after the procedure? After the procedure, it is common to have:  A sore throat.  Nausea.  Bloating.  Dizziness.  Fatigue.  Follow these instructions at home:  Do not eat or drink anything until the numbing medicine (local anesthetic) has worn off and your gag reflex has returned. You will know that the  local anesthetic has worn off when you can swallow comfortably.  Do not drive for 24 hours if you received a medicine to help you relax (sedative).  If your health care provider took a tissue sample for testing during the procedure, make sure to get your test results. This is your responsibility. Ask your health care provider or the department performing the test when your results will be ready.  Keep all follow-up visits as told by your health care provider. This is important. Contact a health care provider if:  You cannot stop coughing.  You are not urinating.  You are urinating less than usual. Get help right away if:  You have trouble swallowing.  You cannot eat or drink.  You have throat or chest pain that gets worse.  You are dizzy or light-headed.  You faint.  You have nausea or vomiting.  You have chills.  You have a fever.  You have severe abdominal pain.  You have black, tarry, or bloody stools. This information is not intended to replace advice given to you by your health care provider. Make sure you discuss  any questions you have with your health care provider. Document Released: 01/31/2012 Document Revised: 07/22/2015 Document Reviewed: 01/07/2015 Elsevier Interactive Patient Education  2018 [employer].  Esophageal Dilatation Esophageal dilatation is a procedure to open a blocked or narrowed part of the esophagus. The esophagus is the long tube in your throat that carries food and liquid from your mouth to your stomach. The procedure is also called esophageal dilation. You may need this procedure if you have a buildup of scar tissue in your esophagus that makes it difficult, painful, or even impossible to swallow. This can be caused by gastroesophageal reflux disease (GERD). In rare cases, people need this procedure because they have cancer of the esophagus or a problem with the way food moves through the esophagus. Sometimes you may need to have another  dilatation to enlarge the opening of the esophagus gradually. Tell a health care provider about:  Any allergies you have.  All medicines you are taking, including vitamins, herbs, eye drops, creams, and over-the-counter medicines.  Any problems you or family members have had with anesthetic medicines.  Any blood disorders you have.  Any surgeries you have had.  Any medical conditions you have.  Any antibiotic medicines you are required to take before dental procedures. What are the risks? Generally, this is a safe procedure. However, problems can occur and include:  Bleeding from a tear in the lining of the esophagus.  A hole (perforation) in the esophagus.  What happens before the procedure?  Do not eat or drink anything after midnight on the night before the procedure or as directed by your health care provider.  Ask your health care provider about changing or stopping your regular medicines. This is especially important if you are taking diabetes medicines or blood thinners.  Plan to have someone take you home after the procedure. What happens during the procedure?  You will be given a medicine that makes you relaxed and sleepy (sedative).  A medicine may be sprayed or gargled to numb the back of the throat.  Your health care provider can use various instruments to do an esophageal dilatation. During the procedure, the instrument used will be placed in your mouth and passed down into your esophagus. Options include: ? Simple dilators. This instrument is carefully placed in the esophagus to stretch it. ? Guided wire bougies. In this method, a flexible tube (endoscope) is used to insert a wire into the esophagus. The dilator is passed over this wire to enlarge the esophagus. Then the wire is removed. ? Balloon dilators. An endoscope with a small balloon at the end is passed down into the esophagus. Inflating the balloon gently stretches the esophagus and opens it up. What  happens after the procedure?  Your blood pressure, heart rate, breathing rate, and blood oxygen level will be monitored often until the medicines you were given have worn off.  Your throat may feel slightly sore and will probably still feel numb. This will improve slowly over time.  You will not be allowed to eat or drink until the throat numbness has resolved.  If this is a same-day procedure, you may be allowed to go home once you have been able to drink, urinate, and sit on the edge of the bed without nausea or dizziness.  If this is a same-day procedure, you should have a friend or family member with you for the next 24 hours after the procedure. This information is not intended to replace advice given to you by  your health care provider. Make sure you discuss any questions you have with your health care provider. Document Released: 04/06/2005 Document Revised: 07/22/2015 Document Reviewed: 06/25/2013 Elsevier Interactive Patient Education  [employer].  Colonoscopy, Adult A colonoscopy is an exam to look at the large intestine. It is done to check for problems, such as:  Lumps (tumors).  Growths (polyps).  Swelling (inflammation).  Bleeding.  What happens before the procedure? Eating and drinking Follow instructions from your doctor about eating and drinking. These instructions may include:  A few days before the procedure - follow a low-fiber diet. ? Avoid nuts. ? Avoid seeds. ? Avoid dried fruit. ? Avoid raw fruits. ? Avoid vegetables.  1-3 days before the procedure - follow a clear liquid diet. Avoid liquids that have red or purple dye. Drink only clear liquids, such as: ? Clear broth or bouillon. ? Black coffee or tea. ? Clear juice. ? Clear soft drinks or sports drinks. ? Gelatin dessert. ? Popsicles.  On the day of the procedure - do not eat or drink anything during the 2 hours before the procedure.  Bowel prep If you were prescribed an oral bowel  prep:  Take it as told by your doctor. Starting the day before your procedure, you will need to drink a lot of liquid. The liquid will cause you to poop (have bowel movements) until your poop is almost clear or light green.  If your skin or butt gets irritated from diarrhea, you may: ? Wipe the area with wipes that have medicine in them, such as adult wet wipes with aloe and vitamin E. ? Put something on your skin that soothes the area, such as petroleum jelly.  If you throw up (vomit) while drinking the bowel prep, take a break for up to 60 minutes. Then begin the bowel prep again. If you keep throwing up and you cannot take the bowel prep without throwing up, call your doctor.  General instructions  Ask your doctor about changing or stopping your normal medicines. This is important if you take diabetes medicines or blood thinners.  Plan to have someone take you home from the hospital or clinic. What happens during the procedure?  An IV tube may be put into one of your veins.  You will be given medicine to help you relax (sedative).  To reduce your risk of infection: ? Your doctors will wash their hands. ? Your anal area will be washed with soap.  You will be asked to lie on your side with your knees bent.  Your doctor will get a long, thin, flexible tube ready. The tube will have a camera and a light on the end.  The tube will be put into your anus.  The tube will be gently put into your large intestine.  Air will be delivered into your large intestine to keep it open. You may feel some pressure or cramping.  The camera will be used to take photos.  A small tissue sample may be removed from your body to be looked at under a microscope (biopsy). If any possible problems are found, the tissue will be sent to a lab for testing.  If small growths are found, your doctor may remove them and have them checked for cancer.  The tube that was put into your anus will be slowly  removed. The procedure may vary among doctors and hospitals. What happens after the procedure?  Your doctor will check on you often until the medicines  you were given have worn off.  Do not drive for 24 hours after the procedure.  You may have a small amount of blood in your poop.  You may pass gas.  You may have mild cramps or bloating in your belly (abdomen).  It is up to you to get the results of your procedure. Ask your doctor, or the department performing the procedure, when your results will be ready. This information is not intended to replace advice given to you by your health care provider. Make sure you discuss any questions you have with your health care provider. Document Released: 03/18/2010 Document Revised: 12/15/2015 Document Reviewed: 04/27/2015 Elsevier Interactive Patient Education  2017 [employer]  Colonoscopy, Adult, Care After This sheet gives you information about how to care for yourself after your procedure. Your health care provider may also give you more specific instructions. If you have problems or questions, contact your health care provider. What can I expect after the procedure? After the procedure, it is common to have:  A small amount of blood in your stool for 24 hours after the procedure.  Some gas.  Mild abdominal cramping or bloating.  Follow these instructions at home: General instructions   For the first 24 hours after the procedure: ? Do not drive or use machinery. ? Do not sign important documents. ? Do not drink alcohol. ? Do your regular daily activities at a slower pace than normal. ? Eat soft, easy-to-digest foods. ? Rest often.  Take over-the-counter or prescription medicines only as told by your health care provider.  It is up to you to get the results of your procedure. Ask your health care provider, or the department performing the procedure, when your results will be ready. Relieving cramping and bloating  Try  walking around when you have cramps or feel bloated.  Apply heat to your abdomen as told by your health care provider. Use a heat source that your health care provider recommends, such as a moist heat pack or a heating pad. ? Place a towel between your skin and the heat source. ? Leave the heat on for 20-30 minutes. ? Remove the heat if your skin turns bright red. This is especially important if you are unable to feel pain, heat, or cold. You may have a greater risk of getting burned. Eating and drinking  Drink enough fluid to keep your urine clear or pale yellow.  Resume your normal diet as instructed by your health care provider. Avoid heavy or fried foods that are hard to digest.  Avoid drinking alcohol for as long as instructed by your health care provider. Contact a health care provider if:  You have blood in your stool 2-3 days after the procedure. Get help right away if:  You have more than a small spotting of blood in your stool.  You pass large blood clots in your stool.  Your abdomen is swollen.  You have nausea or vomiting.  You have a fever.  You have increasing abdominal pain that is not relieved with medicine. This information is not intended to replace advice given to you by your health care provider. Make sure you discuss any questions you have with your health care provider. Document Released: 09/28/2003 Document Revised: 11/08/2015 Document Reviewed: 04/27/2015 Elsevier Interactive Patient Education  2018 [employer] Anesthesia is a term that refers to techniques, procedures, and medicines that help a person stay safe and comfortable during a medical procedure. Monitored anesthesia care,  or sedation, is one type of anesthesia. Your anesthesia specialist may recommend sedation if you will be having a procedure that does not require you to be unconscious, such as:  Cataract surgery.  A dental procedure.  A biopsy.  A  colonoscopy.  During the procedure, you may receive a medicine to help you relax (sedative). There are three levels of sedation:  Mild sedation. At this level, you may feel awake and relaxed. You will be able to follow directions.  Moderate sedation. At this level, you will be sleepy. You may not remember the procedure.  Deep sedation. At this level, you will be asleep. You will not remember the procedure.  The more medicine you are given, the deeper your level of sedation will be. Depending on how you respond to the procedure, the anesthesia specialist may change your level of sedation or the type of anesthesia to fit your needs. An anesthesia specialist will monitor you closely during the procedure. Let your health care provider know about:  Any allergies you have.  All medicines you are taking, including vitamins, herbs, eye drops, creams, and over-the-counter medicines.  Any use of steroids (by mouth or as a cream).  Any problems you or family members have had with sedatives and anesthetic medicines.  Any blood disorders you have.  Any surgeries you have had.  Any medical conditions you have, such as sleep apnea.  Whether you are pregnant or may be pregnant.  Any use of cigarettes, alcohol, or street drugs. What are the risks? Generally, this is a safe procedure. However, problems may occur, including:  Getting too much medicine (oversedation).  Nausea.  Allergic reaction to medicines.  Trouble breathing. If this happens, a breathing tube may be used to help with breathing. It will be removed when you are awake and breathing on your own.  Heart trouble.  Lung trouble.  Before the procedure Staying hydrated Follow instructions from your health care provider about hydration, which may include:  Up to 2 hours before the procedure - you may continue to drink clear liquids, such as water, clear fruit juice, black coffee, and plain tea.  Eating and drinking  restrictions Follow instructions from your health care provider about eating and drinking, which may include:  8 hours before the procedure - stop eating heavy meals or foods such as meat, fried foods, or fatty foods.  6 hours before the procedure - stop eating light meals or foods, such as toast or cereal.  6 hours before the procedure - stop drinking milk or drinks that contain milk.  2 hours before the procedure - stop drinking clear liquids.  Medicines Ask your health care provider about:  Changing or stopping your regular medicines. This is especially important if you are taking diabetes medicines or blood thinners.  Taking medicines such as aspirin and ibuprofen. These medicines can thin your blood. Do not take these medicines before your procedure if your health care provider instructs you not to.  Tests and exams  You will have a physical exam.  You may have blood tests done to show: ? How well your kidneys and liver are working. ? How well your blood can clot.  General instructions  Plan to have someone take you home from the hospital or clinic.  If you will be going home right after the procedure, plan to have someone with you for 24 hours.  What happens during the procedure?  Your blood pressure, heart rate, breathing, level of pain and overall  condition will be monitored.  An IV tube will be inserted into one of your veins.  Your anesthesia specialist will give you medicines as needed to keep you comfortable during the procedure. This may mean changing the level of sedation.  The procedure will be performed. After the procedure  Your blood pressure, heart rate, breathing rate, and blood oxygen level will be monitored until the medicines you were given have worn off.  Do not drive for 24 hours if you received a sedative.  You may: ? Feel sleepy, clumsy, or nauseous. ? Feel forgetful about what happened after the procedure. ? Have a sore throat if you had a  breathing tube during the procedure. ? Vomit. This information is not intended to replace advice given to you by your health care provider. Make sure you discuss any questions you have with your health care provider. Document Released: 11/09/2004 Document Revised: 07/23/2015 Document Reviewed: 06/06/2015 Elsevier Interactive Patient Education  2018 [employer], Care After These instructions provide you with information about caring for yourself after your procedure. Your health care provider may also give you more specific instructions. Your treatment has been planned according to current medical practices, but problems sometimes occur. Call your health care provider if you have any problems or questions after your procedure. What can I expect after the procedure? After your procedure, it is common to:  Feel sleepy for several hours.  Feel clumsy and have poor balance for several hours.  Feel forgetful about what happened after the procedure.  Have poor judgment for several hours.  Feel nauseous or vomit.  Have a sore throat if you had a breathing tube during the procedure.  Follow these instructions at home: For at least 24 hours after the procedure:   Do not: ? Participate in activities in which you could fall or become injured. ? Drive. ? Use heavy machinery. ? Drink alcohol. ? Take sleeping pills or medicines that cause drowsiness. ? Make important decisions or sign legal documents. ? Take care of children on your own.  Rest. Eating and drinking  Follow the diet that is recommended by your health care provider.  If you vomit, drink water, juice, or soup when you can drink without vomiting.  Make sure you have little or no nausea before eating solid foods. General instructions  Have a responsible adult stay with you until you are awake and alert.  Take over-the-counter and prescription medicines only as told by your health care  provider.  If you smoke, do not smoke without supervision.  Keep all follow-up visits as told by your health care provider. This is important. Contact a health care provider if:  You keep feeling nauseous or you keep vomiting.  You feel light-headed.  You develop a rash.  You have a fever. Get help right away if:  You have trouble breathing. This information is not intended to replace advice given to you by your health care provider. Make sure you discuss any questions you have with your health care provider. Document Released: 06/06/2015 Document Revised: 10/06/2015 Document Reviewed: 06/06/2015 Elsevier Interactive Patient Education  [employer].

## 2017-06-28 ENCOUNTER — Inpatient Hospital Stay (HOSPITAL_COMMUNITY): Admission: RE | Admit: 2017-06-28 | Payer: PPO | Source: Ambulatory Visit

## 2017-07-03 ENCOUNTER — Encounter (INDEPENDENT_AMBULATORY_CARE_PROVIDER_SITE_OTHER): Payer: Self-pay | Admitting: *Deleted

## 2017-07-03 NOTE — Telephone Encounter (Signed)
This encounter was created in error - please disregard.

## 2017-07-04 ENCOUNTER — Encounter (INDEPENDENT_AMBULATORY_CARE_PROVIDER_SITE_OTHER): Payer: Self-pay | Admitting: *Deleted

## 2017-07-04 ENCOUNTER — Telehealth (INDEPENDENT_AMBULATORY_CARE_PROVIDER_SITE_OTHER): Payer: Self-pay | Admitting: *Deleted

## 2017-07-04 MED ORDER — SOD PHOS MONO-SOD PHOS DIBASIC 1.102-0.398 G PO TABS
32.0000 | ORAL_TABLET | Freq: Once | ORAL | 0 refills | Status: AC
Start: 2017-07-04 — End: 2017-07-04

## 2017-07-04 NOTE — Telephone Encounter (Signed)
Patient needs osmo

## 2017-07-26 NOTE — Patient Instructions (Signed)
[place]  07/26/2017     @PREFPERIOPPHARMACY @   Your procedure is scheduled on Friday, June 7 .  Report to [facility] at 1100 A.M.  Call this number if you have problems the morning of surgery:  [phone]   Remember:  No food after midnight.  You may drink clear liquids until midnight .     Take these medicines the morning of surgery with A SIP OF WATER Prilosec and either your Vicodin or Oxycodone.    Do not wear jewelry, make-up or nail polish.  Do not wear lotions, powders, or perfumes, or deodorant.  Do not shave 48 hours prior to surgery.  Men may shave face and neck.  Do not bring valuables to the hospital.  [facility] is not responsible for any belongings or valuables.  Contacts, dentures or bridgework may not be worn into surgery.  Leave your suitcase in the car.  After surgery it may be brought to your room.  For patients admitted to the hospital, discharge time will be determined by your treatment team.  Patients discharged the day of surgery will not be allowed to drive home.   Name and phone number of your driver:   family Special instructions:  Please follow instruction given to you by Dr. Laural Golden.  Please read over the following fact sheets that you were given. Pain Booklet, Anesthesia Post-op Instructions and Care and Recovery After Surgery      Cataract A cataract is cloudiness on the lens of your eye. The lens is the clear part of your eye that is behind your iris and pupil. The lens focuses light on the retina, which lets you see clearly. When a lens becomes cloudy, vision may become blurry. The clouding can range from a tiny dot to complete cloudiness. As some cataracts develop, they make a person more nearsighted. Other cataracts increase glare. Cataracts can worsen over time, and sometimes the pupil can look white. Cataracts get bigger and they cloud more of the lens, making it difficult to see. Cataracts can affect one eye or both eyes. What  are the causes? Most cataracts are associated with age-related eye changes. The eye lens is mostly made up of water and protein. Normally, this protein is arranged in a way that keeps the lens clear. Cataracts develop when protein begins to clump together over time. This clouds the lens and lets less light pass through to the retina, which causes blurry vision. What increases the risk? This condition is more likely to develop in people who:  Are 66 years of age or older.  Have diabetes.  Have high blood pressure.  Takecertain medicines, such as steroids or hormone replacement therapy.  Have had an eye injury.  Have or have had eye inflammation.  Have a family history of cataracts.  Smoke.  Drink alcohol heavily.  Are frequently exposed to sun or very strong light without eye protection.  Are obese.  Have been exposed to large amounts of radiation, lead, or other toxic substances.  Have had eye surgery.  What are the signs or symptoms? The main symptom of a cataract is blurry vision. Your vision may change or get worse over time. Other symptoms include:  Increased glare.  Seeing a bright ring or halo around light.  Poor night vision.  Double vision in one eye.  Having trouble seeing, even while wearing contact lenses or glasses.  Seeing colors that appear faded.  Trouble telling the difference between blue and  purple.  Needing frequent changes to your prescription glasses or contacts.  How is this diagnosed? This condition is diagnosed with a medical history and eye exam. You may need to see an eye specialist (optometrist or ophthalmologist). Your health care provider may enlarge (dilate) your pupils with eye drops to see the back of your eye more clearly and look for signs of cataracts or other damage. You may also have tests, including:  A visual acuity test. This uses a chart to determine the smallest letters that you can see from a specific distance.  A  slit-lamp exam. This uses a microscope to examine small sections of your eye for abnormalities.  Tonometry. This test measures the pressure of the fluid inside your eye.  How is this treated? Treatment depends on the stage of your cataract. For an early cataract, vision may improve by using different eyeglasses or stronger lighting. If that does not help your vision, surgery may be recommended to remove the cataract. If your health care provider thinks your cataract may be linked to any medicines that you are taking, he or she may change your medicines. Follow these instructions at home: Lifestyle  Use stronger or brighter lighting.  Consider using a magnifying glass for reading or other activities.  Become familiar with your surroundings. Having poor vision can put you at a greater risk for tripping, falling, or bumping into things.  Wear sunglasses and a hat if you are sensitive to bright light or are having problems with glare.  Quit smoking if you smoke. If you need help quitting, talk with your health care provider. General instructions  If you are prescribed new eyeglasses, wear them as told by your health care provider.  Take over-the-counter and prescription medicines only as told by your health care provider. Do not change your medicines unless told by your health care provider.  Do not drive or operate heavy machinery if your vision is blurry, particularly at night.  Keep your blood sugar under control, if you have diabetes.  Keep all follow-up visits as told by your health care provider. This is important. Contact a health care provider if:  Your symptoms get worse.  Your vision affects your ability to perform daily activities.  You have new symptoms.  You have a fever. Get help right away if:  You have sudden vision loss.  You have redness, swelling, or increasing pain in your eye.  You develop a headache and sensitivity to light. This information is not  intended to replace advice given to you by your health care provider. Make sure you discuss any questions you have with your health care provider. Document Released: 02/13/2005 Document Revised: 06/24/2015 Document Reviewed: 08/19/2014 Elsevier Interactive Patient Education  2018 [place] Surgery, Care After Refer to this sheet in the next few weeks. These instructions provide you with information about caring for yourself after your procedure. Your health care provider may also give you more specific instructions. Your treatment has been planned according to current medical practices, but problems sometimes occur. Call your health care provider if you have any problems or questions after your procedure. What can I expect after the procedure? After the procedure, it is common to have:  Itching.  Discomfort.  Fluid discharge.  Sensitivity to light and to touch.  Bruising.  Follow these instructions at home: [place] your eye every day for signs of infection. Watch for: ? Redness, swelling, or pain. ? Fluid, blood, or pus. ? Warmth. ?  Bad smell. Activity  Avoid strenuous activities, such as playing contact sports, for as long as told by your health care provider.  Do not drive or operate heavy machinery until your health care provider approves.  Do not bend or lift heavy objects. Bending increases pressure in the eye. You can walk, climb stairs, and do light household chores.  Ask your health care provider when you can return to work. If you work in a dusty environment, you may be advised to wear protective eyewear for a period of time. General instructions  Take or apply over-the-counter and prescription medicines only as told by your health care provider. This includes eye drops.  Do not touch or rub your eyes.  If you were given a protective shield, wear it as told by your health care provider. If you were not given a protective shield, wear sunglasses  as told by your health care provider to protect your eyes.  Keep the area around your eye clean and dry. Avoid swimming or allowing water to hit you directly in the face while showering until told by your health care provider. Keep soap and shampoo out of your eyes.  Do not put a contact lens into the affected eye or eyes until your health care provider approves.  Keep all follow-up visits as told by your health care provider. This is important. Contact a health care provider if:   You have increased bruising around your eye.  You have pain that is not helped with medicine.  You have a fever.  You have redness, swelling, or pain in your eye.  You have fluid, blood, or pus coming from your incision.  Your vision gets worse. Get help right away if:  You have sudden vision loss. This information is not intended to replace advice given to you by your health care provider. Make sure you discuss any questions you have with your health care provider. Document Released: 09/02/2004 Document Revised: 06/24/2015 Document Reviewed: 12/24/2014 Elsevier Interactive Patient Education  2018 [place] Anesthesia is a term that refers to techniques, procedures, and medicines that help a person stay safe and comfortable during a medical procedure. Monitored anesthesia care, or sedation, is one type of anesthesia. Your anesthesia specialist may recommend sedation if you will be having a procedure that does not require you to be unconscious, such as:  Cataract surgery.  A dental procedure.  A biopsy.  A colonoscopy.  During the procedure, you may receive a medicine to help you relax (sedative). There are three levels of sedation:  Mild sedation. At this level, you may feel awake and relaxed. You will be able to follow directions.  Moderate sedation. At this level, you will be sleepy. You may not remember the procedure.  Deep sedation. At this level, you will be  asleep. You will not remember the procedure.  The more medicine you are given, the deeper your level of sedation will be. Depending on how you respond to the procedure, the anesthesia specialist may change your level of sedation or the type of anesthesia to fit your needs. An anesthesia specialist will monitor you closely during the procedure. Let your health care provider know about:  Any allergies you have.  All medicines you are taking, including vitamins, herbs, eye drops, creams, and over-the-counter medicines.  Any use of steroids (by mouth or as a cream).  Any problems you or family members have had with sedatives and anesthetic medicines.  Any blood disorders you have.  Any surgeries you have had.  Any medical conditions you have, such as sleep apnea.  Whether you are pregnant or may be pregnant.  Any use of cigarettes, alcohol, or street drugs. What are the risks? Generally, this is a safe procedure. However, problems may occur, including:  Getting too much medicine (oversedation).  Nausea.  Allergic reaction to medicines.  Trouble breathing. If this happens, a breathing tube may be used to help with breathing. It will be removed when you are awake and breathing on your own.  Heart trouble.  Lung trouble.  Before the procedure Staying hydrated Follow instructions from your health care provider about hydration, which may include:  Up to 2 hours before the procedure - you may continue to drink clear liquids, such as water, clear fruit juice, black coffee, and plain tea.  Eating and drinking restrictions Follow instructions from your health care provider about eating and drinking, which may include:  8 hours before the procedure - stop eating heavy meals or foods such as meat, fried foods, or fatty foods.  6 hours before the procedure - stop eating light meals or foods, such as toast or cereal.  6 hours before the procedure - stop drinking milk or drinks that  contain milk.  2 hours before the procedure - stop drinking clear liquids.  Medicines Ask your health care provider about:  Changing or stopping your regular medicines. This is especially important if you are taking diabetes medicines or blood thinners.  Taking medicines such as aspirin and ibuprofen. These medicines can thin your blood. Do not take these medicines before your procedure if your health care provider instructs you not to.  Tests and exams  You will have a physical exam.  You may have blood tests done to show: ? How well your kidneys and liver are working. ? How well your blood can clot.  General instructions  Plan to have someone take you home from the hospital or clinic.  If you will be going home right after the procedure, plan to have someone with you for 24 hours.  What happens during the procedure?  Your blood pressure, heart rate, breathing, level of pain and overall condition will be monitored.  An IV tube will be inserted into one of your veins.  Your anesthesia specialist will give you medicines as needed to keep you comfortable during the procedure. This may mean changing the level of sedation.  The procedure will be performed. After the procedure  Your blood pressure, heart rate, breathing rate, and blood oxygen level will be monitored until the medicines you were given have worn off.  Do not drive for 24 hours if you received a sedative.  You may: ? Feel sleepy, clumsy, or nauseous. ? Feel forgetful about what happened after the procedure. ? Have a sore throat if you had a breathing tube during the procedure. ? Vomit. This information is not intended to replace advice given to you by your health care provider. Make sure you discuss any questions you have with your health care provider. Document Released: 11/09/2004 Document Revised: 07/23/2015 Document Reviewed: 06/06/2015 Elsevier Interactive Patient Education  [employer].

## 2017-07-30 ENCOUNTER — Other Ambulatory Visit: Payer: Self-pay

## 2017-07-30 ENCOUNTER — Encounter (HOSPITAL_COMMUNITY)
Admission: RE | Admit: 2017-07-30 | Discharge: 2017-07-30 | Disposition: A | Discharge status: Home / Self Care | Payer: PPO | Source: Ambulatory Visit | Attending: Internal Medicine | Admitting: Internal Medicine

## 2017-07-30 ENCOUNTER — Encounter (HOSPITAL_COMMUNITY): Payer: Self-pay

## 2017-07-30 DIAGNOSIS — R1310 Dysphagia, unspecified: Secondary | ICD-10-CM

## 2017-07-30 DIAGNOSIS — Z86010 Personal history of colonic polyps: Secondary | ICD-10-CM

## 2017-07-30 DIAGNOSIS — Z01818 Encounter for other preprocedural examination: Secondary | ICD-10-CM | POA: Insufficient documentation

## 2017-07-30 DIAGNOSIS — K219 Gastro-esophageal reflux disease without esophagitis: Secondary | ICD-10-CM

## 2017-07-30 HISTORY — DX: Personal history of urinary calculi: Z87.442

## 2017-07-30 LAB — BASIC METABOLIC PANEL
Anion gap: 7 (ref 5–15)
BUN: 17 mg/dL (ref 6–20)
CO2: 28 mmol/L (ref 22–32)
Calcium: 9.8 mg/dL (ref 8.9–10.3)
Chloride: 105 mmol/L (ref 101–111)
Creatinine, Ser: 0.87 mg/dL (ref 0.44–1.00)
GFR calc Af Amer: >60 mL/min (ref >60)
GFR calc non Af Amer: >60 mL/min (ref >60)
Glucose, Bld: 75 mg/dL (ref 65–99)
Potassium: 4.4 mmol/L (ref 3.5–5.1)
Sodium: 140 mmol/L (ref 135–145)

## 2017-07-30 LAB — CBC WITH DIFFERENTIAL/PLATELET
Basophils Absolute: 0.1 10*3/uL (ref 0.0–0.1)
Basophils Relative: 1 %
Eosinophils Absolute: 0.2 10*3/uL (ref 0.0–0.7)
Eosinophils Relative: 3 %
HCT: 43.7 % (ref 36.0–46.0)
Hemoglobin: 14 g/dL (ref 12.0–15.0)
Lymphocytes Relative: 35 %
Lymphs Abs: 2 10*3/uL (ref 0.7–4.0)
MCH: 28.5 pg (ref 26.0–34.0)
MCHC: 32 g/dL (ref 30.0–36.0)
MCV: 88.8 fL (ref 78.0–100.0)
Monocytes Absolute: 0.4 10*3/uL (ref 0.1–1.0)
Monocytes Relative: 8 %
Neutro Abs: 2.9 10*3/uL (ref 1.7–7.7)
Neutrophils Relative %: 53 %
Platelets: 245 10*3/uL (ref 150–400)
RBC: 4.92 MIL/uL (ref 3.87–5.11)
RDW: 13.4 % (ref 11.5–15.5)
WBC: 5.6 10*3/uL (ref 4.0–10.5)

## 2017-08-01 ENCOUNTER — Encounter (INDEPENDENT_AMBULATORY_CARE_PROVIDER_SITE_OTHER): Payer: Self-pay | Admitting: *Deleted

## 2017-08-01 ENCOUNTER — Telehealth (INDEPENDENT_AMBULATORY_CARE_PROVIDER_SITE_OTHER): Payer: Self-pay | Admitting: *Deleted

## 2017-08-01 MED ORDER — SUPREP BOWEL PREP KIT 17.5-3.13-1.6 GM/177ML PO SOLN: 1.0000 | Freq: Once | ORAL | 0 refills | Status: AC

## 2017-08-01 NOTE — Telephone Encounter (Signed)
Patient needs suprep 

## 2017-08-03 ENCOUNTER — Ambulatory Visit (HOSPITAL_COMMUNITY)
Admission: RE | Admit: 2017-08-03 | Discharge: 2017-08-03 | Disposition: A | Discharge status: Home / Self Care | Payer: PPO | Source: Ambulatory Visit | Attending: Internal Medicine | Admitting: Internal Medicine

## 2017-08-03 ENCOUNTER — Ambulatory Visit (HOSPITAL_COMMUNITY): Payer: PPO | Admitting: Anesthesiology

## 2017-08-03 ENCOUNTER — Encounter (HOSPITAL_COMMUNITY)
Admission: RE | Disposition: A | Discharge status: Home / Self Care | Payer: Self-pay | Source: Ambulatory Visit | Attending: Internal Medicine

## 2017-08-03 ENCOUNTER — Encounter (HOSPITAL_COMMUNITY): Payer: Self-pay | Admitting: *Deleted

## 2017-08-03 DIAGNOSIS — Z885 Allergy status to narcotic agent status: Secondary | ICD-10-CM | POA: Insufficient documentation

## 2017-08-03 DIAGNOSIS — K219 Gastro-esophageal reflux disease without esophagitis: Secondary | ICD-10-CM

## 2017-08-03 DIAGNOSIS — Z09 Encounter for follow-up examination after completed treatment for conditions other than malignant neoplasm: Secondary | ICD-10-CM | POA: Diagnosis not present

## 2017-08-03 DIAGNOSIS — Z881 Allergy status to other antibiotic agents status: Secondary | ICD-10-CM | POA: Diagnosis not present

## 2017-08-03 DIAGNOSIS — N189 Chronic kidney disease, unspecified: Secondary | ICD-10-CM | POA: Insufficient documentation

## 2017-08-03 DIAGNOSIS — M5416 Radiculopathy, lumbar region: Secondary | ICD-10-CM | POA: Insufficient documentation

## 2017-08-03 DIAGNOSIS — I129 Hypertensive chronic kidney disease with stage 1 through stage 4 chronic kidney disease, or unspecified chronic kidney disease: Secondary | ICD-10-CM | POA: Diagnosis not present

## 2017-08-03 DIAGNOSIS — K222 Esophageal obstruction: Secondary | ICD-10-CM | POA: Insufficient documentation

## 2017-08-03 DIAGNOSIS — E785 Hyperlipidemia, unspecified: Secondary | ICD-10-CM | POA: Diagnosis not present

## 2017-08-03 DIAGNOSIS — K644 Residual hemorrhoidal skin tags: Secondary | ICD-10-CM | POA: Diagnosis not present

## 2017-08-03 DIAGNOSIS — K449 Diaphragmatic hernia without obstruction or gangrene: Secondary | ICD-10-CM | POA: Diagnosis not present

## 2017-08-03 DIAGNOSIS — Z1211 Encounter for screening for malignant neoplasm of colon: Secondary | ICD-10-CM | POA: Diagnosis not present

## 2017-08-03 DIAGNOSIS — R1314 Dysphagia, pharyngoesophageal phase: Secondary | ICD-10-CM | POA: Diagnosis not present

## 2017-08-03 DIAGNOSIS — Z86010 Personal history of colonic polyps: Secondary | ICD-10-CM | POA: Diagnosis not present

## 2017-08-03 DIAGNOSIS — Z87442 Personal history of urinary calculi: Secondary | ICD-10-CM | POA: Insufficient documentation

## 2017-08-03 DIAGNOSIS — D123 Benign neoplasm of transverse colon: Secondary | ICD-10-CM | POA: Diagnosis not present

## 2017-08-03 DIAGNOSIS — K209 Esophagitis, unspecified: Secondary | ICD-10-CM | POA: Diagnosis not present

## 2017-08-03 DIAGNOSIS — Z79899 Other long term (current) drug therapy: Secondary | ICD-10-CM | POA: Diagnosis not present

## 2017-08-03 DIAGNOSIS — R1310 Dysphagia, unspecified: Secondary | ICD-10-CM | POA: Insufficient documentation

## 2017-08-03 HISTORY — PX: ESOPHAGEAL DILATION: SHX303

## 2017-08-03 HISTORY — PX: ESOPHAGOGASTRODUODENOSCOPY (EGD) WITH PROPOFOL: SHX5813

## 2017-08-03 HISTORY — PX: COLONOSCOPY WITH PROPOFOL: SHX5780

## 2017-08-03 SURGERY — ESOPHAGOGASTRODUODENOSCOPY (EGD) WITH PROPOFOL
Anesthesia: General

## 2017-08-03 MED ORDER — ALBUTEROL SULFATE HFA 108 (90 BASE) MCG/ACT IN AERS
INHALATION_SPRAY | RESPIRATORY_TRACT | Status: AC
  Filled 2017-08-03: qty 6.7

## 2017-08-03 MED ORDER — PROPOFOL 10 MG/ML IV BOLUS
INTRAVENOUS | Status: AC
  Filled 2017-08-03: qty 80

## 2017-08-03 MED ORDER — LACTATED RINGERS IV SOLN
INTRAVENOUS | Status: DC
  Administered 2017-08-03: 07:00:00 via INTRAVENOUS

## 2017-08-03 MED ORDER — ALBUTEROL SULFATE HFA 108 (90 BASE) MCG/ACT IN AERS
INHALATION_SPRAY | RESPIRATORY_TRACT | Status: DC | PRN
  Administered 2017-08-03: 3 via RESPIRATORY_TRACT

## 2017-08-03 MED ORDER — PROPOFOL 10 MG/ML IV BOLUS
INTRAVENOUS | Status: DC | PRN
  Administered 2017-08-03: 20 mg via INTRAVENOUS
  Administered 2017-08-03: 40 mg via INTRAVENOUS
  Administered 2017-08-03: 20 mg via INTRAVENOUS

## 2017-08-03 MED ORDER — PROPOFOL 10 MG/ML IV BOLUS
INTRAVENOUS | Status: AC
  Filled 2017-08-03: qty 140

## 2017-08-03 MED ORDER — PROPOFOL 500 MG/50ML IV EMUL
INTRAVENOUS | Status: DC | PRN
  Administered 2017-08-03: 150 ug/kg/min via INTRAVENOUS

## 2017-08-03 NOTE — Anesthesia Preprocedure Evaluation (Signed)
Anesthesia Evaluation  Patient identified by MRN, date of birth, ID band Patient awake    Reviewed: Allergy & Precautions, H&P , NPO status , Patient's Chart, lab work & pertinent test results, reviewed documented beta blocker date and time   History of Anesthesia Complications (+) PONV and history of anesthetic complications  Airway Mallampati: II  TM Distance: >3 FB Neck ROM: full    Dental no notable dental hx.    Pulmonary neg pulmonary ROS,    Pulmonary exam normal breath sounds clear to auscultation       Cardiovascular Exercise Tolerance: Good hypertension, negative cardio ROS   Rhythm:regular Rate:Normal     Neuro/Psych  Neuromuscular disease negative neurological ROS  negative psych ROS   GI/Hepatic negative GI ROS, Neg liver ROS, hiatal hernia, GERD  ,  Endo/Other  negative endocrine ROS  Renal/GU Renal diseasenegative Renal ROS  negative genitourinary   Musculoskeletal   Abdominal   Peds  Hematology negative hematology ROS (+) anemia ,   Anesthesia Other Findings   Reproductive/Obstetrics negative OB ROS                             Anesthesia Physical Anesthesia Plan  ASA: II  Anesthesia Plan: General   Post-op Pain Management:    Induction:   PONV Risk Score and Plan:   Airway Management Planned:   Additional Equipment:   Intra-op Plan:   Post-operative Plan:   Informed Consent: I have reviewed the patients History and Physical, chart, labs and discussed the procedure including the risks, benefits and alternatives for the proposed anesthesia with the patient or authorized representative who has indicated his/her understanding and acceptance.   Dental Advisory Given  Plan Discussed with: CRNA  Anesthesia Plan Comments:         Anesthesia Quick Evaluation

## 2017-08-03 NOTE — Anesthesia Procedure Notes (Signed)
Procedure Name: MAC Date/Time: 08/03/2017 7:50 AM Performed by: Vista Deck, CRNA Pre-anesthesia Checklist: Patient identified, Emergency Drugs available, Suction available, Timeout performed and Patient being monitored Patient Re-evaluated:Patient Re-evaluated prior to induction Oxygen Delivery Method: Non-rebreather mask

## 2017-08-03 NOTE — Op Note (Signed)
[facility] Patient Name: Autumn Duran Procedure Date: 08/03/2017 7:15 AM MRN: [id number] Date of Birth: January 12, 1951 Attending MD: Hildred Laser , MD CSN: [id number] Age: 67 Admit Type: Outpatient Procedure:                Upper GI endoscopy Indications:              Esophageal dysphagia Providers:                Hildred Laser, MD, Lurline Del, RN, Randa Spike,                            Technician Referring MD:             [name], MD Medicines:                Propofol per Anesthesia Complications:            No immediate complications. Estimated Blood Loss:     Estimated blood loss: none. Procedure:                Pre-Anesthesia Assessment:                           - Prior to the procedure, a History and Physical                            was performed, and patient medications and                            allergies were reviewed. The patient's tolerance of                            previous anesthesia was also reviewed. The risks                            and benefits of the procedure and the sedation                            options and risks were discussed with the patient.                            All questions were answered, and informed consent                            was obtained. Prior Anticoagulants: The patient                            last took ibuprofen 1 day prior to the procedure.                            ASA Grade Assessment: II - A patient with mild                            systemic disease. After reviewing the risks and  benefits, the patient was deemed in satisfactory                            condition to undergo the procedure.                           After obtaining informed consent, the endoscope was                            passed under direct vision. Throughout the                            procedure, the patient's blood pressure, pulse, and                            oxygen saturations were  monitored continuously. The                            [id number]-iL0 ([id number]) scope was introduced through the                            and advanced to the body of the stomach. The upper                            GI endoscopy was technically difficult and complex                            due to abnormal anatomylarge hernia with                            organo-axial rotation. The patient tolerated the                            procedure fairly well. Scope In: 7:35:43 AM Scope Out: 0:25:85 AM Total Procedure Duration: 0 hours 11 minutes 11 seconds  Findings:      The examined esophagus was normal. A TTS dilator was passed through the       scope. Dilation with a 15-16.5-18 mm balloon dilator was performed to 15       mm, 16.5 mm and 18 mm. The dilation site was examined and showed no       change and no bleeding, mucosal tear or perforation.      A non-obstructing Schatzki ring was found at the gastroesophageal       junction. The dilation site was examined and showed no change and no       bleeding, mucosal tear or perforation.      A large hiatal hernia was present.      An examination of the duodenum was not performed. Impression:               - Normal esophagus.                           - Non-obstructing Schatzki ring. ilated.                           -  Large hiatal hernia.                           - No specimens collected.                           - Incomplete exam secondary to large hiatal hernia                            with organo-axial rotation. Moderate Sedation:      Per Anesthesia Care Recommendation:           - Patient has a contact number available for                            emergencies. The signs and symptoms of potential                            delayed complications were discussed with the                            patient. Return to normal activities tomorrow.                            Written discharge instructions were provided to the                             patient.                           - Resume previous diet today.                           - Continue present medications.                           - UGIS to be scheduled. Procedure Code(s):        --- Professional ---                           [id number], 52, Esophagogastroduodenoscopy, flexible,                            transoral; diagnostic, including collection of                            specimen(s) by brushing or washing, when performed                            (separate procedure) Diagnosis Code(s):        --- Professional ---                           K22.2, Esophageal obstruction                           K44.9, Diaphragmatic hernia without obstruction or  gangrene                           R13.14, Dysphagia, pharyngoesophageal phase CPT copyright 2017 American Medical Association. All rights reserved. The codes documented in this report are preliminary and upon coder review may  be revised to meet current compliance requirements. Hildred Laser, MD Hildred Laser, MD 08/03/2017 8:39:37 AM This report has been signed electronically. Number of Addenda: 0

## 2017-08-03 NOTE — Anesthesia Postprocedure Evaluation (Signed)
Anesthesia Post Note  Patient: Autumn Duran  Procedure(s) Performed: ESOPHAGOGASTRODUODENOSCOPY (EGD) WITH PROPOFOL (N/A ) ESOPHAGEAL DILATION (N/A ) COLONOSCOPY WITH PROPOFOL (N/A )  Patient location during evaluation: Short Stay Anesthesia Type: General Level of consciousness: awake and alert and patient cooperative Pain management: satisfactory to patient Vital Signs Assessment: post-procedure vital signs reviewed and stable Respiratory status: spontaneous breathing Cardiovascular status: stable Postop Assessment: no apparent nausea or vomiting Anesthetic complications: no     Last Vitals:  Vitals:   08/03/17 0915 08/03/17 0936  BP: 119/66 (!) 142/53  Pulse: (!) 56 (!) 58  Resp: 19 18  Temp:  36.4 C  SpO2: 94% 91%    Last Pain:  Vitals:   08/03/17 0936  TempSrc: Oral  PainSc: 7                  Maksym Pfiffner

## 2017-08-03 NOTE — Anesthesia Procedure Notes (Signed)
Procedure Name: MAC Date/Time: 08/03/2017 7:26 AM Performed by: Vista Deck, CRNA Pre-anesthesia Checklist: Patient identified, Emergency Drugs available, Suction available, Timeout performed and Patient being monitored Patient Re-evaluated:Patient Re-evaluated prior to induction Oxygen Delivery Method: Nasal Cannula

## 2017-08-03 NOTE — Addendum Note (Signed)
Addendum  created 08/03/17 1317 by Vista Deck, CRNA   Order list changed

## 2017-08-03 NOTE — Discharge Instructions (Signed)
No aspirin or NSAIDs for 1 week. Resume other medications and diet as before. No driving for 24 hours.   Physician will call with biopsy results.  Esophagogastroduodenoscopy, Care After Refer to this sheet in the next few weeks. These instructions provide you with information about caring for yourself after your procedure. Your health care provider may also give you more specific instructions. Your treatment has been planned according to current medical practices, but problems sometimes occur. Call your health care provider if you have any problems or questions after your procedure. What can I expect after the procedure? After the procedure, it is common to have:  A sore throat.  Nausea.  Bloating.  Dizziness.  Fatigue.  Follow these instructions at home:  Do not eat or drink anything until the numbing medicine (local anesthetic) has worn off and your gag reflex has returned. You will know that the local anesthetic has worn off when you can swallow comfortably.  Do not drive for 24 hours if you received a medicine to help you relax (sedative).  If your health care provider took a tissue sample for testing during the procedure, make sure to get your test results. This is your responsibility. Ask your health care provider or the department performing the test when your results will be ready.  Keep all follow-up visits as told by your health care provider. This is important. Contact a health care provider if:  You cannot stop coughing.  You are not urinating.  You are urinating less than usual. Get help right away if:  You have trouble swallowing.  You cannot eat or drink.  You have throat or chest pain that gets worse.  You are dizzy or light-headed.  You faint.  You have nausea or vomiting.  You have chills.  You have a fever.  You have severe abdominal pain.  You have black, tarry, or bloody stools. This information is not intended to replace advice given to  you by your health care provider. Make sure you discuss any questions you have with your health care provider. Document Released: 01/31/2012 Document Revised: 07/22/2015 Document Reviewed: 01/07/2015 Elsevier Interactive Patient Education  2018 [employer].   Esophageal Dilatation Esophageal dilatation is a procedure to open a blocked or narrowed part of the esophagus. The esophagus is the long tube in your throat that carries food and liquid from your mouth to your stomach. The procedure is also called esophageal dilation. You may need this procedure if you have a buildup of scar tissue in your esophagus that makes it difficult, painful, or even impossible to swallow. This can be caused by gastroesophageal reflux disease (GERD). In rare cases, people need this procedure because they have cancer of the esophagus or a problem with the way food moves through the esophagus. Sometimes you may need to have another dilatation to enlarge the opening of the esophagus gradually. Tell a health care provider about:  Any allergies you have.  All medicines you are taking, including vitamins, herbs, eye drops, creams, and over-the-counter medicines.  Any problems you or family members have had with anesthetic medicines.  Any blood disorders you have.  Any surgeries you have had.  Any medical conditions you have.  Any antibiotic medicines you are required to take before dental procedures. What are the risks? Generally, this is a safe procedure. However, problems can occur and include:  Bleeding from a tear in the lining of the esophagus.  A hole (perforation) in the esophagus.  What happens before  the procedure?  Do not eat or drink anything after midnight on the night before the procedure or as directed by your health care provider.  Ask your health care provider about changing or stopping your regular medicines. This is especially important if you are taking diabetes medicines or blood  thinners.  Plan to have someone take you home after the procedure. What happens during the procedure?  You will be given a medicine that makes you relaxed and sleepy (sedative).  A medicine may be sprayed or gargled to numb the back of the throat.  Your health care provider can use various instruments to do an esophageal dilatation. During the procedure, the instrument used will be placed in your mouth and passed down into your esophagus. Options include: ? Simple dilators. This instrument is carefully placed in the esophagus to stretch it. ? Guided wire bougies. In this method, a flexible tube (endoscope) is used to insert a wire into the esophagus. The dilator is passed over this wire to enlarge the esophagus. Then the wire is removed. ? Balloon dilators. An endoscope with a small balloon at the end is passed down into the esophagus. Inflating the balloon gently stretches the esophagus and opens it up. What happens after the procedure?  Your blood pressure, heart rate, breathing rate, and blood oxygen level will be monitored often until the medicines you were given have worn off.  Your throat may feel slightly sore and will probably still feel numb. This will improve slowly over time.  You will not be allowed to eat or drink until the throat numbness has resolved.  If this is a same-day procedure, you may be allowed to go home once you have been able to drink, urinate, and sit on the edge of the bed without nausea or dizziness.  If this is a same-day procedure, you should have a friend or family member with you for the next 24 hours after the procedure. This information is not intended to replace advice given to you by your health care provider. Make sure you discuss any questions you have with your health care provider. Document Released: 04/06/2005 Document Revised: 07/22/2015 Document Reviewed: 06/25/2013 Elsevier Interactive Patient Education  2018 [employer].    Colonoscopy,  Adult, Care After This sheet gives you information about how to care for yourself after your procedure. Your doctor may also give you more specific instructions. If you have problems or questions, call your doctor. Follow these instructions at home: General instructions   For the first 24 hours after the procedure: ? Do not drive or use machinery. ? Do not sign important documents. ? Do not drink alcohol. ? Do your daily activities more slowly than normal. ? Eat foods that are soft and easy to digest. ? Rest often.  Take over-the-counter or prescription medicines only as told by your doctor.  It is up to you to get the results of your procedure. Ask your doctor, or the department performing the procedure, when your results will be ready. To help cramping and bloating:  Try walking around.  Put heat on your belly (abdomen) as told by your doctor. Use a heat source that your doctor recommends, such as a moist heat pack or a heating pad. ? Put a towel between your skin and the heat source. ? Leave the heat on for 20-30 minutes. ? Remove the heat if your skin turns bright red. This is especially important if you cannot feel pain, heat, or cold. You can get  burned. Eating and drinking  Drink enough fluid to keep your pee (urine) clear or pale yellow.  Return to your normal diet as told by your doctor. Avoid heavy or fried foods that are hard to digest.  Avoid drinking alcohol for as long as told by your doctor. Contact a doctor if:  You have blood in your poop (stool) 2-3 days after the procedure. Get help right away if:  You have more than a small amount of blood in your poop.  You see large clumps of tissue (blood clots) in your poop.  Your belly is swollen.  You feel sick to your stomach (nauseous).  You throw up (vomit).  You have a fever.  You have belly pain that gets worse, and medicine does not help your pain. This information is not intended to replace advice  given to you by your health care provider. Make sure you discuss any questions you have with your health care provider. Document Released: 03/18/2010 Document Revised: 11/08/2015 Document Reviewed: 11/08/2015 Elsevier Interactive Patient Education  2017 [employer].

## 2017-08-03 NOTE — Transfer of Care (Signed)
Immediate Anesthesia Transfer of Care Note  Patient: Autumn Duran  Procedure(s) Performed: ESOPHAGOGASTRODUODENOSCOPY (EGD) WITH PROPOFOL (N/A ) ESOPHAGEAL DILATION (N/A ) COLONOSCOPY WITH PROPOFOL (N/A )  Patient Location: PACU  Anesthesia Type:General  Level of Consciousness: awake, alert  and patient cooperative  Airway & Oxygen Therapy: Patient Spontanous Breathing  Post-op Assessment: Report given to RN and Post -op Vital signs reviewed and stable  Post vital signs: Reviewed and stable  Last Vitals:  Vitals Value Taken Time  BP    Temp    Pulse 63 08/03/2017  8:38 AM  Resp    SpO2 97 % 08/03/2017  8:38 AM  Vitals shown include unvalidated device data.  Last Pain:  Vitals:   08/03/17 0728  TempSrc:   PainSc: 0-No pain      Patients Stated Pain Goal: 5 (03/29/41 8887)  Complications: No apparent anesthesia complications

## 2017-08-03 NOTE — H&P (Signed)
Autumn Duran is an 67 y.o. female.   Chief Complaint: Patient is here for EGD ED and colonoscopy. HPI: Patient is 67 year old Caucasian female with multiple medical problems including history of GERD and distal esophageal stricture as well as history of colonic adenomas who is here for EGD and colonoscopy.  She presents with solid food dysphagia a few years duration.  She says she has had multiple episodes of food impaction relieved with regurgitation.  She points to upper sternal area sort of bolus obstruction.  She states she is taking omeprazole like she should and it controls her heartburn.  She denies nausea vomiting abdominal pain or melena.  She also denies rectal bleeding.  She has chronic constipation because she takes pain medication for chronic back pain.  Last colonoscopy was also in October 2013. Family history is negative for CRC.  Past Medical History:  Diagnosis Date  . Bronchitis, chronic ([place])   . Chronic back pain   . Chronic kidney disease    kidney stone s/p stent placement ( removed)  . Complication of anesthesia   . Compression fracture of L3 lumbar vertebra   . Diverticulosis   . GERD (gastroesophageal reflux disease)   . Hemorrhoids   . Hiatal hernia   . History of kidney stones   . Hx of adenomatous colonic polyps   . Hyperlipidemia   . Hypertension   . Lumbar radicular pain   . PONV (postoperative nausea and vomiting)   . Smoldering myeloma ([place])   .    . T7 vertebral fracture [facility])     Past Surgical History:  Procedure Laterality Date  . BACK SURGERY    . BONE MARROW ASPIRATION Left 02/2014  . BONE MARROW BIOPSY Left 02/2014  . CHOLECYSTECTOMY    . COLONOSCOPY  12/18/2011   Procedure: COLONOSCOPY;  Surgeon: Lafayette Dragon, MD;  Location: [facility] ENDOSCOPY;  Service: Endoscopy;  Laterality: N/A;  . ESOPHAGOGASTRODUODENOSCOPY  12/18/2011   Procedure: ESOPHAGOGASTRODUODENOSCOPY (EGD);  Surgeon: Lafayette Dragon, MD;  Location: [facility];  Service: Endoscopy;   Laterality: N/A;  . LUMBAR LAMINECTOMY FOR EPIDURAL ABSCESS Left 09/22/2013   Procedure: LUMBAR LAMINECTOMY FOR EPIDURAL ABSCESS LEFT LUMBAR FIVE-SACRAL ONE;  Surgeon: Charlie Pitter, MD;  Location: [place] [facility];  Service: Neurosurgery;  Laterality: Left;  . RADIOLOGY WITH ANESTHESIA N/A 08/13/2014   Procedure: RADIOLOGY WITH ANESTHESIA ;  Surgeon: Medication Radiologist, MD;  Location: [place] [facility];  Service: Radiology;  Laterality: N/A;  . URETERAL STENT PLACEMENT    . VERTEBROPLASTY N/A 07/14/2013   Procedure: VERTEBROPLASTY WITH LUMBAR THREE BIOPSY;  Surgeon: Charlie Pitter, MD;  Location: [place] [facility] OR[facility];  Service: Neurosurgery;  Laterality: N/A;  VERTEBROPLASTY WITH LUMBAR THREE BIOPSY    Family History  Problem Relation Age of Onset  . Breast cancer Sister 53  . Breast cancer Other 3       aunt   Social History:  reports that she has never smoked. She has never used smokeless tobacco. She reports that she drinks alcohol. She reports that she does not use drugs.  Allergies:  Allergies  Allergen Reactions  . Morphine And Related Other (See Comments)    Headache  . Tetracycline Nausea Only    Medications Prior to Admission  Medication Sig Dispense Refill  . HYDROcodone-acetaminophen (NORCO/VICODIN) 5-325 MG tablet Take 1 tablet by mouth every 6 (six) hours as needed for pain. for pain  0  . ibuprofen (ADVIL,MOTRIN) 200 MG tablet Take 400 mg by mouth  daily as needed for mild pain.    Marland Kitchen omeprazole (PRILOSEC) 20 MG capsule Take 20 mg by mouth 2 (two) times daily before a meal.    . oxyCODONE (OXYCONTIN) 20 mg 12 hr tablet Take 20 mg by mouth every 12 (twelve) hours.     . Polysacchar Iron-FA-B12 (FERREX 150 FORTE) 150-1-25 MG-MG-MCG CAPS Take 1 tablet by mouth daily. 30 capsule 6    No results found for this or any previous visit (from the past 48 hour(s)). No results found.  ROS  Blood pressure (!) 139/56, pulse (!) 59, temperature 98.2 F (36.8 C), temperature source Oral, height  '5\' 1"'  (1.549 m), weight 159 lb (72.1 kg), SpO2 94 %. Physical Exam  Constitutional: She appears well-developed and well-nourished.  HENT:  Mouth/Throat: Oropharynx is clear and moist.  Eyes: Conjunctivae are normal. No scleral icterus.  Neck: No thyromegaly present.  Cardiovascular: Normal rate, regular rhythm and normal heart sounds.  No murmur heard. Respiratory: Effort normal and breath sounds normal.  GI:  Abdomen is full but soft and nontender without organomegaly or masses.  Musculoskeletal: She exhibits no edema.  Lymphadenopathy:    She has no cervical adenopathy.  Neurological: She is alert.  Skin: Skin is warm and dry.     Assessment/Plan Esophageal dysphagia. History of colonic adenomas. EGD with ED and surveillance colonoscopy.   Hildred Laser, MD 08/03/2017, 7:21 AM

## 2017-08-03 NOTE — Op Note (Signed)
[facility] Patient Name: Autumn Duran Procedure Date: 08/03/2017 7:50 AM MRN: [id number] Date of Birth: [birth year] Attending MD: Hildred Laser , MD CSN: [id number] Age: 67 Admit Type: Outpatient Procedure:                Colonoscopy Indications:              High risk colon cancer surveillance: Personal                            history of colonic polyps Providers:                Hildred Laser, MD, Lurline Del, RN, Randa Spike,                            Technician Referring MD:             [name] Medicines:                Propofol per Anesthesia Complications:            No immediate complications. Estimated Blood Loss:     Estimated blood loss was minimal. Procedure:                Pre-Anesthesia Assessment:                           - Prior to the procedure, a History and Physical                            was performed, and patient medications and                            allergies were reviewed. The patient's tolerance of                            previous anesthesia was also reviewed. The risks                            and benefits of the procedure and the sedation                            options and risks were discussed with the patient.                            All questions were answered, and informed consent                            was obtained. Prior Anticoagulants: The patient                            last took ibuprofen 1 day prior to the procedure.                            ASA Grade Assessment: II - A patient with mild  systemic disease. After reviewing the risks and                            benefits, the patient was deemed in satisfactory                            condition to undergo the procedure.                           After obtaining informed consent, the colonoscope                            was passed under direct vision. Throughout the                            procedure, the patient's blood  pressure, pulse, and                            oxygen saturations were monitored continuously. The                            [id number] ([id number]) scope was introduced through                            the anus and advanced to the the cecum, identified                            by appendiceal orifice and ileocecal valve. The                            colonoscopy was performed without difficulty. The                            patient tolerated the procedure well. The quality                            of the bowel preparation was good. Scope In: 8:05:41 AM Scope Out: 8:26:02 AM Scope Withdrawal Time: 0 hours 7 minutes 21 seconds  Total Procedure Duration: 0 hours 20 minutes 21 seconds  Findings:      The perianal and digital rectal examinations were normal.      A small polyp was found in the proximal transverse colon. The polyp was       sessile. Biopsies were taken with a cold forceps for histology. The       pathology specimen was placed into Bottle Number 1.      A 8 mm polyp was found in the hepatic flexure. The polyp was removed       with a hot snare. Resection and retrieval were complete. The pathology       specimen was placed into Bottle Number 2.      External hemorrhoids were found during retroflexion. The hemorrhoids       were small. Impression:               - One small polyp in the proximal transverse colon.  Biopsied.                           - One 8 mm polyp at the hepatic flexure, removed                            with a hot snare. Resected and retrieved.                           - External hemorrhoids. Moderate Sedation:      Per Anesthesia Care Recommendation:           - Patient has a contact number available for                            emergencies. The signs and symptoms of potential                            delayed complications were discussed with the                            patient. Return to normal activities  tomorrow.                            Written discharge instructions were provided to the                            patient.                           - Resume previous diet today.                           - Continue present medications.                           - No aspirin, ibuprofen, naproxen, or other                            non-steroidal anti-inflammatory drugs for 1 day                            after polyp removal.                           - Await pathology results.                           - Repeat colonoscopy in 5 years for surveillance. Procedure Code(s):        --- Professional ---                           [phone], Colonoscopy, flexible; with removal of                            tumor(s), polyp(s), or other lesion(s) by snare  technique                           45380, 59, Colonoscopy, flexible; with biopsy,                            single or multiple Diagnosis Code(s):        --- Professional ---                           Z86.010, Personal history of colonic polyps                           D12.3, Benign neoplasm of transverse colon (hepatic                            flexure or splenic flexure)                           K64.4, Residual hemorrhoidal skin tags CPT copyright 2017 [employer]. All rights reserved. The codes documented in this report are preliminary and upon coder review may  be revised to meet current compliance requirements. Hildred Laser, MD Hildred Laser, MD 08/03/2017 8:42:46 AM This report has been signed electronically. Number of Addenda: 0

## 2017-08-06 ENCOUNTER — Other Ambulatory Visit (INDEPENDENT_AMBULATORY_CARE_PROVIDER_SITE_OTHER): Payer: Self-pay | Admitting: *Deleted

## 2017-08-06 DIAGNOSIS — R1310 Dysphagia, unspecified: Secondary | ICD-10-CM

## 2017-08-06 DIAGNOSIS — K449 Diaphragmatic hernia without obstruction or gangrene: Secondary | ICD-10-CM

## 2017-08-07 ENCOUNTER — Other Ambulatory Visit (INDEPENDENT_AMBULATORY_CARE_PROVIDER_SITE_OTHER): Payer: Self-pay | Admitting: Internal Medicine

## 2017-08-07 MED ORDER — MONTELUKAST SODIUM 10 MG PO TABS: 10.0000 mg | ORAL_TABLET | Freq: Every day | ORAL | 1 refills | Status: DC

## 2017-08-09 ENCOUNTER — Ambulatory Visit (HOSPITAL_COMMUNITY): Payer: PPO

## 2017-08-10 ENCOUNTER — Encounter (HOSPITAL_COMMUNITY): Payer: Self-pay | Admitting: Internal Medicine

## 2017-08-16 ENCOUNTER — Ambulatory Visit (HOSPITAL_COMMUNITY): Payer: PPO

## 2017-08-16 ENCOUNTER — Ambulatory Visit (HOSPITAL_COMMUNITY)
Admission: RE | Admit: 2017-08-16 | Discharge: 2017-08-16 | Disposition: A | Discharge status: Home / Self Care | Payer: PPO | Source: Ambulatory Visit | Attending: Internal Medicine | Admitting: Internal Medicine

## 2017-08-16 DIAGNOSIS — K449 Diaphragmatic hernia without obstruction or gangrene: Secondary | ICD-10-CM

## 2017-08-16 DIAGNOSIS — R1310 Dysphagia, unspecified: Secondary | ICD-10-CM

## 2017-09-18 DIAGNOSIS — M4626 Osteomyelitis of vertebra, lumbar region: Secondary | ICD-10-CM | POA: Diagnosis not present

## 2017-09-19 DIAGNOSIS — K449 Diaphragmatic hernia without obstruction or gangrene: Secondary | ICD-10-CM | POA: Diagnosis not present

## 2017-11-13 DIAGNOSIS — M19042 Primary osteoarthritis, left hand: Secondary | ICD-10-CM | POA: Diagnosis not present

## 2017-11-13 DIAGNOSIS — M19041 Primary osteoarthritis, right hand: Secondary | ICD-10-CM | POA: Diagnosis not present

## 2017-11-13 DIAGNOSIS — Z299 Encounter for prophylactic measures, unspecified: Secondary | ICD-10-CM | POA: Diagnosis not present

## 2017-11-13 DIAGNOSIS — I10 Essential (primary) hypertension: Secondary | ICD-10-CM | POA: Diagnosis not present

## 2017-11-13 DIAGNOSIS — Z6828 Body mass index (BMI) 28.0-28.9, adult: Secondary | ICD-10-CM | POA: Diagnosis not present

## 2017-11-13 DIAGNOSIS — J069 Acute upper respiratory infection, unspecified: Secondary | ICD-10-CM | POA: Diagnosis not present

## 2017-11-13 DIAGNOSIS — J029 Acute pharyngitis, unspecified: Secondary | ICD-10-CM | POA: Diagnosis not present

## 2017-11-22 DIAGNOSIS — Z6829 Body mass index (BMI) 29.0-29.9, adult: Secondary | ICD-10-CM | POA: Diagnosis not present

## 2017-11-22 DIAGNOSIS — J019 Acute sinusitis, unspecified: Secondary | ICD-10-CM | POA: Diagnosis not present

## 2017-11-29 ENCOUNTER — Inpatient Hospital Stay (HOSPITAL_COMMUNITY): Payer: PPO | Attending: Hematology

## 2017-11-29 DIAGNOSIS — D509 Iron deficiency anemia, unspecified: Secondary | ICD-10-CM | POA: Diagnosis not present

## 2017-11-29 DIAGNOSIS — C9030 Solitary plasmacytoma not having achieved remission: Secondary | ICD-10-CM | POA: Insufficient documentation

## 2017-11-29 LAB — FERRITIN: Ferritin: 20 ng/mL (ref 11–307)

## 2017-11-29 LAB — CBC WITH DIFFERENTIAL/PLATELET
Basophils Absolute: 0 10*3/uL (ref 0.0–0.1)
Basophils Relative: 0 %
Eosinophils Absolute: 0.1 10*3/uL (ref 0.0–0.7)
Eosinophils Relative: 2 %
HCT: 43.8 % (ref 36.0–46.0)
Hemoglobin: 14.1 g/dL (ref 12.0–15.0)
Lymphocytes Relative: 30 %
Lymphs Abs: 2.5 10*3/uL (ref 0.7–4.0)
MCH: 28.1 pg (ref 26.0–34.0)
MCHC: 32.2 g/dL (ref 30.0–36.0)
MCV: 87.3 fL (ref 78.0–100.0)
Monocytes Absolute: 0.5 10*3/uL (ref 0.1–1.0)
Monocytes Relative: 7 %
Neutro Abs: 5 10*3/uL (ref 1.7–7.7)
Neutrophils Relative %: 61 %
Platelets: 260 10*3/uL (ref 150–400)
RBC: 5.02 MIL/uL (ref 3.87–5.11)
RDW: 13.4 % (ref 11.5–15.5)
WBC: 8.2 10*3/uL (ref 4.0–10.5)

## 2017-11-29 LAB — COMPREHENSIVE METABOLIC PANEL
ALT: 10 U/L (ref 0–44)
AST: 13 U/L — ABNORMAL LOW (ref 15–41)
Albumin: 3.7 g/dL (ref 3.5–5.0)
Alkaline Phosphatase: 49 U/L (ref 38–126)
Anion gap: 7 (ref 5–15)
BUN: 13 mg/dL (ref 8–23)
CO2: 29 mmol/L (ref 22–32)
Calcium: 9.9 mg/dL (ref 8.9–10.3)
Chloride: 105 mmol/L (ref 98–111)
Creatinine, Ser: 0.77 mg/dL (ref 0.44–1.00)
GFR calc Af Amer: >60 mL/min (ref >60)
GFR calc non Af Amer: >60 mL/min (ref >60)
Glucose, Bld: 96 mg/dL (ref 70–99)
Potassium: 3.7 mmol/L (ref 3.5–5.1)
Sodium: 141 mmol/L (ref 135–145)
Total Bilirubin: 0.9 mg/dL (ref 0.3–1.2)
Total Protein: 6.9 g/dL (ref 6.5–8.1)

## 2017-11-29 LAB — LACTATE DEHYDROGENASE: LDH: 117 U/L (ref 98–192)

## 2017-11-30 LAB — PROTEIN ELECTROPHORESIS, SERUM
A/G Ratio: 1.3 (ref 0.7–1.7)
Albumin ELP: 3.6 g/dL (ref 2.9–4.4)
Alpha-1-Globulin: 0.2 g/dL (ref 0.0–0.4)
Alpha-2-Globulin: 0.6 g/dL (ref 0.4–1.0)
Beta Globulin: 0.9 g/dL (ref 0.7–1.3)
Gamma Globulin: 1 g/dL (ref 0.4–1.8)
Globulin, Total: 2.8 g/dL (ref 2.2–3.9)
Total Protein ELP: 6.4 g/dL (ref 6.0–8.5)

## 2017-11-30 LAB — KAPPA/LAMBDA LIGHT CHAINS
Kappa free light chain: 15.2 mg/L (ref 3.3–19.4)
Kappa, lambda light chain ratio: 0.81 (ref 0.26–1.65)
Lambda free light chains: 18.8 mg/L (ref 5.7–26.3)

## 2017-11-30 LAB — IGG, IGA, IGM
IgA: 225 mg/dL (ref 87–352)
IgG (Immunoglobin G), Serum: 1092 mg/dL (ref 700–1600)
IgM (Immunoglobulin M), Srm: 83 mg/dL (ref 26–217)

## 2017-11-30 LAB — BETA 2 MICROGLOBULIN, SERUM: Beta-2 Microglobulin: 1.6 mg/L (ref 0.6–2.4)

## 2017-12-06 ENCOUNTER — Other Ambulatory Visit (HOSPITAL_COMMUNITY): Payer: PPO

## 2017-12-06 ENCOUNTER — Ambulatory Visit (HOSPITAL_COMMUNITY): Payer: PPO | Admitting: Internal Medicine

## 2017-12-06 DIAGNOSIS — R1310 Dysphagia, unspecified: Secondary | ICD-10-CM | POA: Diagnosis not present

## 2017-12-06 DIAGNOSIS — K219 Gastro-esophageal reflux disease without esophagitis: Secondary | ICD-10-CM | POA: Diagnosis not present

## 2017-12-06 DIAGNOSIS — K224 Dyskinesia of esophagus: Secondary | ICD-10-CM | POA: Diagnosis not present

## 2017-12-06 DIAGNOSIS — K449 Diaphragmatic hernia without obstruction or gangrene: Secondary | ICD-10-CM | POA: Diagnosis not present

## 2017-12-10 ENCOUNTER — Inpatient Hospital Stay (HOSPITAL_COMMUNITY): Payer: PPO | Attending: Hematology | Admitting: Internal Medicine

## 2017-12-10 ENCOUNTER — Encounter (HOSPITAL_COMMUNITY): Payer: Self-pay | Admitting: Internal Medicine

## 2017-12-10 DIAGNOSIS — I10 Essential (primary) hypertension: Secondary | ICD-10-CM

## 2017-12-10 DIAGNOSIS — D509 Iron deficiency anemia, unspecified: Secondary | ICD-10-CM

## 2017-12-10 DIAGNOSIS — C9030 Solitary plasmacytoma not having achieved remission: Secondary | ICD-10-CM | POA: Diagnosis not present

## 2017-12-10 DIAGNOSIS — K5900 Constipation, unspecified: Secondary | ICD-10-CM | POA: Diagnosis not present

## 2017-12-10 DIAGNOSIS — K449 Diaphragmatic hernia without obstruction or gangrene: Secondary | ICD-10-CM

## 2017-12-10 DIAGNOSIS — Z79899 Other long term (current) drug therapy: Secondary | ICD-10-CM | POA: Insufficient documentation

## 2017-12-10 DIAGNOSIS — M810 Age-related osteoporosis without current pathological fracture: Secondary | ICD-10-CM | POA: Diagnosis not present

## 2017-12-10 HISTORY — DX: Age-related osteoporosis without current pathological fracture: M81.0

## 2017-12-10 NOTE — Progress Notes (Signed)
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  Assessment and Plan:  1. Plasmacytoma.  Originally followed by Dr. Talbert Cage.  She had L3 biopsy done on 07/14/2013 that showed increased plasma cells.  She received radiation to the lumbar spine. Bone marrow biopsy has been performed showed no evidence of a plasma cell neoplasm. Bone marrow biopsy has normal cytogenetics. Kappa lambda light chain ratio has been normal, SPEP/IEP has been normal.   Labs done 11/29/2017 reviewed and showed WBC 8.2 HB 14.1 plts 260,000.  Chemistries WNL with K+ 3.7 Cr 0.77 and normal LFTs, Calcium of 9.9.  SPEP shows no monoclonal protein, quant IG and K/L ratio WNL.    The patient received 1 dose of Reclast in 2016.  She was questioning if she should have ongoing therapy.  Bone density done 05/2017 showed evidence of osteopenia.  She has not undergone skeletal survey as previously ordered.  Pt is set up for skeletal survey.  I discussed with her that based on findings of osteoporosis, she has option of Prolia 60 mg SQ every 6 months.  Side effects of the medication were reviewed with pt and she was provided written information.  She reports she is scheduled for surgery at [facility] and would like to defer any therapy until after that surgery.  Orders placed and pt will notify when therapy desired.    She remains on observation and is 4 years out from her diagnosis. She will RTC 05/2018 for follow-up and repeat labs.    2.  Osteoporosis.  Dexa done 05/2017 shows osteoporosis.  She has option of Prolia 60 mg every 6 months.  Pt should also take calcium and vitamin D.  She will be recommended for repeat BMD in  05/2019.    3.  Hiatal hernia.  Pt was having problems with Nausea and vomiting so she was referred to  Dr. Laural Golden of GI.  EGD done 07/2017 showed large hiatal hernia.  Pt is scheduled to be seen by surgery at [facility] for evaluation.  Follow-up with GI and surgery as recommended.    4  Iron deficiency anemia.  She had colonoscopy done 07/2017 that showed hemorrhoids and polyps.   Pathology returned as tubular adenoma and no malignancy.  She was last treated with IV iron in 02/2017.  Ferritin decreased at 20.  Pt has option of IV Iron, but desires to defer treatment until after her surgery.  HB 14 on labs done 11/29/2017.  Orders for Injectafer 750 mg IV D1 and D8 entered.  She should notify clinic when she desires to schedule.    5.  Hypertension.  Blood pressure is 147/71.  Follow-up with PCP.  6.  Constipation.  Stool softeners prn.  Pt on pain meds which is likely etiology.  Follow-up with PCP and GI as recommended.    7.  Health maintenance.  Follow-up with Gi as recommended.  Continue mammograms as directed.  Greater than 30 minutes spent with more than 50% spent in counseling and coordination of care.     Current Status: Patient is seen today for follow-up to go over labs and bone density.  She reports she is scheduled for surgery for hiatal hernia.    Problem List Patient Active Problem List   Diagnosis  Date Noted  . Osteoporosis [M81.0] 12/10/2017  . Dysphagia [R13.10] 06/26/2017  . Gastroesophageal reflux disease [K21.9] 06/26/2017  . Hx of colonic polyps [Z86.010] 06/26/2017  . Nausea with vomiting [R11.2] 10/12/2014  . MRSA infection [A49.02]   . 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 Acute low back pain [M54.5] 08/13/2014  . Epidural abscess [G06.2] 08/13/2014  . Hypertension [I10]   . Chronic back pain [M54.9, G89.29]   . Essential hypertension [I10]   . Other iron deficiency anemia [D50.8] 04/10/2014  . 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 Right  hip pain [M25.551] 12/23/2013  . Epidural abscess, L2-L5 [G06.1] 11/04/2013  . Sepsis ([place]) [A41.9] 09/21/2013  . Hypercalcemia [E83.52] 08/20/2013  . Hypokalemia [E87.6] 08/20/2013  . Lumbar compression fracture ([place]) [S32.000A] 07/12/2013  . Compression fracture [IMO0002] 07/12/2013  . Benign neoplasm of colon [D12.6] 12/18/2011  . Esophageal reflux [K21.9] 12/18/2011  . Other dysphagia [R13.19] 12/18/2011    Past Medical History Past Medical History:  Diagnosis Date  . Bronchitis, chronic ([place])   . Chronic back pain   . Chronic kidney disease    kidney stone s/p stent placement ( removed)  . Complication of anesthesia   . Compression fracture of L3 lumbar vertebra   . Diverticulosis   . GERD (gastroesophageal reflux disease)   . Hemorrhoids   . Hiatal hernia   . History of kidney stones   . Hx of adenomatous colonic polyps   . Hyperlipidemia   . Hypertension   . Lumbar radicular pain   . Osteoporosis 12/10/2017  . PONV (postoperative nausea and vomiting)   . Smoldering myeloma ([place])   . Sore throat   . T7 vertebral fracture [facility])     Past Surgical History Past Surgical History:  Procedure Laterality Date  . BACK SURGERY    . BONE MARROW ASPIRATION Left 02/2014  . BONE MARROW BIOPSY Left 02/2014  . CHOLECYSTECTOMY    . COLONOSCOPY  12/18/2011   Procedure: COLONOSCOPY;  Surgeon: Lafayette Dragon, MD;  Location: [facility];  Service: Endoscopy;  Laterality: N/A;  . COLONOSCOPY WITH PROPOFOL N/A 08/03/2017   Procedure: COLONOSCOPY WITH PROPOFOL;  Surgeon: Rogene Houston, MD;  [facility];  Service: Endoscopy;  Laterality: N/A;  . ESOPHAGEAL DILATION N/A 08/03/2017   Procedure: ESOPHAGEAL DILATION;  Surgeon: Rogene Houston, MD;  [facility];  Service: Endoscopy;  Laterality: N/A;  . ESOPHAGOGASTRODUODENOSCOPY  12/18/2011   Procedure: ESOPHAGOGASTRODUODENOSCOPY (EGD);  Surgeon: Lafayette Dragon, MD;  Location: [facility];  Service: Endoscopy;   Laterality: N/A;  . ESOPHAGOGASTRODUODENOSCOPY (EGD) WITH PROPOFOL N/A 08/03/2017   Procedure: ESOPHAGOGASTRODUODENOSCOPY (EGD) WITH PROPOFOL;  Surgeon: Rogene Houston, MD;  [facility];  Service: Endoscopy;  Laterality: N/A;  pt knows to arrive at 6:15  . LUMBAR LAMINECTOMY FOR EPIDURAL ABSCESS Left 09/22/2013   Procedure: LUMBAR LAMINECTOMY FOR EPIDURAL ABSCESS LEFT LUMBAR FIVE-SACRAL ONE;  Surgeon: Charlie Pitter, MD;  Location: [facility];  Service: Neurosurgery;  Laterality: Left;  . RADIOLOGY WITH ANESTHESIA N/A 08/13/2014   Procedure: RADIOLOGY WITH ANESTHESIA ;  Surgeon: Medication Radiologist, MD;  Location: [place];  Service: Radiology;  Laterality: N/A;  . URETERAL STENT PLACEMENT    . VERTEBROPLASTY N/A 07/14/2013   Procedure: VERTEBROPLASTY WITH LUMBAR THREE BIOPSY;  Surgeon: Charlie Pitter, MD;  Location: [place] [facility];  Service: Neurosurgery;  Laterality: N/A;  VERTEBROPLASTY WITH LUMBAR THREE BIOPSY    Family History Family  History  Problem Relation Age of Onset  . Breast cancer Sister 28  . Breast cancer Other 57       aunt     Social History  reports that she has never smoked. She has never used smokeless tobacco. She reports that she drinks alcohol. She reports that she does not use drugs.  Medications  Current Outpatient Medications:  .  albuterol (PROVENTIL HFA;VENTOLIN HFA) 108 (90 Base) MCG/ACT inhaler, Inhale into the lungs., Disp: , Rfl:  .  amoxicillin-clavulanate (AUGMENTIN) 875-125 MG tablet, amoxicillin 875 mg-potassium clavulanate 125 mg tablet, Disp: , Rfl:  .  benzonatate (TESSALON) 200 MG capsule, benzonatate 200 mg capsule  TAKE ONE CAPSULE BY MOUTH THREE TIMES DAILY AS NEEDED, Disp: , Rfl:  .  cyclobenzaprine (FLEXERIL) 10 MG tablet, cyclobenzaprine 10 mg tablet, Disp: , Rfl:  .  diclofenac (VOLTAREN) 75 MG EC tablet, diclofenac sodium 75 mg tablet,delayed release, Disp: , Rfl:  .  fentaNYL (DURAGESIC - DOSED MCG/HR) 12 MCG/HR, fentanyl 12  mcg/hr transdermal patch, Disp: , Rfl:  .  furosemide (LASIX) 40 MG tablet, furosemide 40 mg tablet, Disp: , Rfl:  .  HYDROcodone-acetaminophen (NORCO/VICODIN) 5-325 MG tablet, Take 1 tablet by mouth every 6 (six) hours as needed for pain. for pain, Disp: , Rfl: 0 .  ibuprofen (ADVIL,MOTRIN) 200 MG tablet, Take 2 tablets (400 mg total) by mouth daily as needed for mild pain., Disp: 30 tablet, Rfl: 0 .  montelukast (SINGULAIR) 10 MG tablet, Take 1 tablet (10 mg total) by mouth at bedtime., Disp: 30 tablet, Rfl: 1 .  nystatin-triamcinolone (MYCOLOG II) cream, nystatin-triamcinolone 100,000 unit/g-0.1 % topical cream  apply to affected area BID as directed, Disp: , Rfl:  .  omeprazole (PRILOSEC) 20 MG capsule, Take 20 mg by mouth 2 (two) times daily before a meal., Disp: , Rfl:  .  oxyCODONE (OXYCONTIN) 20 mg 12 hr tablet, Take 20 mg by mouth every 12 (twelve) hours. , Disp: , Rfl:  .  Polysacchar Iron-FA-B12 (FERREX 150 FORTE) 150-1-25 MG-MG-MCG CAPS, Take 1 tablet by mouth daily., Disp: 30 capsule, Rfl: 6 .  potassium chloride SA (K-DUR,KLOR-CON) 20 MEQ tablet, potassium chloride ER 20 mEq tablet,extended release(part/cryst), Disp: , Rfl:   Allergies Morphine and related and Tetracycline  Review of Systems Review of Systems - Oncology ROS negative other than constipation.     Physical Exam  Vitals Wt Readings from Last 3 Encounters:  08/03/17 159 lb (72.1 kg)  07/30/17 159 lb (72.1 kg)  06/20/17 156 lb 14.4 oz (71.2 kg)   Temp Readings from Last 3 Encounters:  08/03/17 97.6 F (36.4 C) (Oral)  07/30/17 98 F (36.7 C) (Oral)  06/20/17 98 F (36.7 C)   BP Readings from Last 3 Encounters:  08/03/17 (!) 142/53  07/30/17 120/71  06/20/17 (!) 170/80   Pulse Readings from Last 3 Encounters:  08/03/17 (!) 58  07/30/17 (!) 50  06/20/17 64    Constitutional: Well-developed, well-nourished, and in no distress.   HENT: Head: Normocephalic and atraumatic.  Mouth/Throat: No  oropharyngeal exudate. Mucosa moist. Eyes: Pupils are equal, round, and reactive to light. Conjunctivae are normal. No scleral icterus.  Neck: Normal range of motion. Neck supple. No JVD present.  Cardiovascular: Normal rate, regular rhythm and normal heart sounds.  Exam reveals no gallop and no friction rub.   No murmur heard. Pulmonary/Chest: Effort normal and breath sounds normal. No respiratory distress. No wheezes.No rales.  Abdominal: Soft. Bowel sounds are normal. No distension. Mild  tenderness in epigastric region.  No guarding.   Musculoskeletal: No edema or tenderness.  Lymphadenopathy: No cervical or supraclavicular adenopathy.  Neurological: Alert and oriented to person, place, and time. No cranial nerve deficit.  Skin: Skin is warm and dry. No rash noted. No erythema. No pallor.  Psychiatric: Affect and judgment normal.   Labs No visits with results within 3 Day(s) from this visit.  Latest known visit with results is:  Appointment on 11/29/2017  Component Date Value Ref Range Status  . WBC 11/29/2017 8.2  4.0 - 10.5 K/uL Final  . RBC 11/29/2017 5.02  3.87 - 5.11 MIL/uL Final  . Hemoglobin 11/29/2017 14.1  12.0 - 15.0 g/dL Final  . HCT 11/29/2017 43.8  36.0 - 46.0 % Final  . MCV 11/29/2017 87.3  78.0 - 100.0 fL Final  . MCH 11/29/2017 28.1  26.0 - 34.0 pg Final  . MCHC 11/29/2017 32.2  30.0 - 36.0 g/dL Final  . RDW 11/29/2017 13.4  11.5 - 15.5 % Final  . Platelets 11/29/2017 260  150 - 400 K/uL Final  . Neutrophils Relative % 11/29/2017 61  % Final  . Neutro Abs 11/29/2017 5.0  1.7 - 7.7 K/uL Final  . Lymphocytes Relative 11/29/2017 30  % Final  . Lymphs Abs 11/29/2017 2.5  0.7 - 4.0 K/uL Final  . Monocytes Relative 11/29/2017 7  % Final  . Monocytes Absolute 11/29/2017 0.5  0.1 - 1.0 K/uL Final  . Eosinophils Relative 11/29/2017 2  % Final  . Eosinophils Absolute 11/29/2017 0.1  0.0 - 0.7 K/uL Final  . Basophils Relative 11/29/2017 0  % Final  . Basophils Absolute  11/29/2017 0.0  0.0 - 0.1 K/uL Final   Performed at [facility], [address] [place], [place] [address]  . Sodium 11/29/2017 141  135 - 145 mmol/L Final  . Potassium 11/29/2017 3.7  3.5 - 5.1 mmol/L Final  . Chloride 11/29/2017 105  98 - 111 mmol/L Final  . CO2 11/29/2017 29  22 - 32 mmol/L Final  . Glucose, Bld 11/29/2017 96  70 - 99 mg/dL Final  . BUN 11/29/2017 13  8 - 23 mg/dL Final  . Creatinine, Ser 11/29/2017 0.77  0.44 - 1.00 mg/dL Final  . Calcium 11/29/2017 9.9  8.9 - 10.3 mg/dL Final  . Total Protein 11/29/2017 6.9  6.5 - 8.1 g/dL Final  . Albumin 11/29/2017 3.7  3.5 - 5.0 g/dL Final  . AST 11/29/2017 13* 15 - 41 U/L Final  . ALT 11/29/2017 10  0 - 44 U/L Final  . Alkaline Phosphatase 11/29/2017 49  38 - 126 U/L Final  . Total Bilirubin 11/29/2017 0.9  0.3 - 1.2 mg/dL Final  . GFR calc non Af Amer 11/29/2017 >60  >60 mL/min Final  . GFR calc Af Amer 11/29/2017 >60  >60 mL/min Final   Comment: (NOTE) The eGFR has been calculated using the CKD EPI equation. This calculation has not been validated in all clinical situations. eGFR's persistently <60 mL/min signify possible Chronic Kidney Disease.   Georgiann Hahn gap 11/29/2017 7  5 - 15 Final   Performed at [facility], [address], [place], [place] [address]  . LDH 11/29/2017 117  98 - 192 U/L Final   Performed at [facility], [address], [place], [place] [address]  . Total Protein ELP 11/29/2017 6.4  6.0 - 8.5 g/dL Final  . Albumin ELP 11/29/2017 3.6  2.9 - 4.4 g/dL Final  . Alpha-1-Globulin 11/29/2017 0.2  0.0 - 0.4 g/dL Final  . Alpha-2-Globulin 11/29/2017 0.6  0.4 - 1.0 g/dL Final  . Beta Globulin 11/29/2017 0.9  0.7 - 1.3 g/dL Final  . Gamma Globulin 11/29/2017 1.0  0.4 - 1.8 g/dL Final  . M-Spike, % 11/29/2017 Not Observed  Not Observed g/dL Final  . SPE Interp. 11/29/2017 Comment   Final   Comment: (NOTE) The SPE pattern appears essentially unremarkable. Evidence of monoclonal protein is not  apparent. Performed At: [facility] [place], [place] [id number] Rush Farmer MD [id number]   . Comment 11/29/2017 Comment   Final   Comment: (NOTE) Protein electrophoresis scan will follow via computer, mail, or courier delivery.   Marland Kitchen GLOBULIN, TOTAL 11/29/2017 2.8  2.2 - 3.9 g/dL Corrected  . A/G Ratio 11/29/2017 1.3  0.7 - 1.7 Corrected  . Kappa free light chain 11/29/2017 15.2  3.3 - 19.4 mg/L Final  . Lamda free light chains 11/29/2017 18.8  5.7 - 26.3 mg/L Final  . Kappa, lamda light chain ratio 11/29/2017 0.81  0.26 - 1.65 Final   Comment: (NOTE) Performed At: [facility] [place], [place] [id number] Rush Farmer MD [id number]   . Beta-2 Microglobulin 11/29/2017 1.6  0.6 - 2.4 mg/L Final   Comment: (NOTE) Siemens Immulite 2000 Immunochemiluminometric assay (ICMA) Values obtained with different assay methods or kits cannot be used interchangeably. Results cannot be interpreted as absolute evidence of the presence or absence of malignant disease. Performed At: [facility] [place], [place] [id number] Rush Farmer MD IW:[id number]   . IgG (Immunoglobin G), Serum 11/29/2017 1,092  700 - 1,600 mg/dL Final  . IgA 11/29/2017 225  87 - 352 mg/dL Final  . IgM (Immunoglobulin M), Srm 11/29/2017 83  26 - 217 mg/dL Final   Comment: (NOTE) Performed At: [facility] [place], [place] [id number] Rush Farmer MD [id number]   . Ferritin 11/29/2017 20  11 - 307 ng/mL Final   Performed at [facility], [address] [place], [place] [address]     Pathology Orders Placed This Encounter  Procedures  . DG Bone Survey Met    Standing Status:   Future    Standing Expiration Date:   02/10/2019    Order Specific Question:   Reason for Exam (SYMPTOM  OR DIAGNOSIS REQUIRED)    Answer:   plasmacytoma    Order Specific Question:   Preferred imaging location?    Answer:   [facility]    Order Specific Question:   Radiology Contrast Protocol - do NOT remove file path    Answer:   [email or web address]  . CBC with Differential/Platelet    Standing Status:   Future    Standing Expiration Date:   12/11/2019  . Comprehensive metabolic panel    Standing Status:   Future    Standing Expiration Date:   12/11/2019  . Lactate dehydrogenase    Standing Status:   Future    Standing Expiration Date:   12/11/2019  . Protein electrophoresis, serum    Standing Status:   Future    Standing Expiration Date:   12/11/2019  . Ferritin    Standing Status:   Future    Standing Expiration Date:   12/11/2019  . IgG, IgA, IgM    Standing Status:   Future    Standing Expiration Date:   12/11/2019  . Kappa/lambda light chains    Standing Status:   Future    Standing  Expiration Date:   12/11/2019       Zoila Shutter MD

## 2017-12-12 DIAGNOSIS — K449 Diaphragmatic hernia without obstruction or gangrene: Secondary | ICD-10-CM | POA: Insufficient documentation

## 2017-12-20 DIAGNOSIS — K449 Diaphragmatic hernia without obstruction or gangrene: Secondary | ICD-10-CM | POA: Diagnosis not present

## 2017-12-20 DIAGNOSIS — Z888 Allergy status to other drugs, medicaments and biological substances status: Secondary | ICD-10-CM | POA: Diagnosis not present

## 2017-12-20 DIAGNOSIS — Z885 Allergy status to narcotic agent status: Secondary | ICD-10-CM | POA: Diagnosis not present

## 2017-12-20 DIAGNOSIS — K219 Gastro-esophageal reflux disease without esophagitis: Secondary | ICD-10-CM | POA: Diagnosis not present

## 2017-12-26 ENCOUNTER — Other Ambulatory Visit: Payer: Self-pay

## 2017-12-26 NOTE — Patient Outreach (Signed)
[facility]) Care Management  12/26/2017  Autumn Duran 09/09/1950 [id number]   Referral Date: 12/26/17 Referral Source:  HTA report Date of Admission: 12/20/17 Diagnosis: Hernia Repair Date of Discharge: 12/21/17 Facility: [place]:  HTA  Outreach attempt: Spoke with patient. She is able to verify HIPAA.  Patient states she is doing well since being home. She has support of family and friends.  Patient has follow up appointment scheduled on 01-07-18 and has transportation to the appointment.  Patient is on liquid diet.  Discussed with patient options for her liquid diet.  She verbalized understanding.  Patient unable to review medications right now as she does not have list or medications right with her.  Patient states she takes as prescribed and is able to afford.  Patient declines any needs presently and none identified on call.    Plan: RN CM will close case at this time.     Jone Baseman, RN, MSN [facility] Care Management Care Management Coordinator Direct Line [phone] Toll Free: [phone]  Fax: [phone]

## 2017-12-28 DIAGNOSIS — Z299 Encounter for prophylactic measures, unspecified: Secondary | ICD-10-CM | POA: Diagnosis not present

## 2017-12-28 DIAGNOSIS — Z6827 Body mass index (BMI) 27.0-27.9, adult: Secondary | ICD-10-CM | POA: Diagnosis not present

## 2017-12-28 DIAGNOSIS — K219 Gastro-esophageal reflux disease without esophagitis: Secondary | ICD-10-CM | POA: Diagnosis not present

## 2017-12-28 DIAGNOSIS — J069 Acute upper respiratory infection, unspecified: Secondary | ICD-10-CM | POA: Diagnosis not present

## 2017-12-28 DIAGNOSIS — I10 Essential (primary) hypertension: Secondary | ICD-10-CM | POA: Diagnosis not present

## 2018-01-07 DIAGNOSIS — Z48815 Encounter for surgical aftercare following surgery on the digestive system: Secondary | ICD-10-CM | POA: Diagnosis not present

## 2018-01-17 DIAGNOSIS — Z0000 Encounter for general adult medical examination without abnormal findings: Secondary | ICD-10-CM | POA: Diagnosis not present

## 2018-01-17 DIAGNOSIS — Z1331 Encounter for screening for depression: Secondary | ICD-10-CM | POA: Diagnosis not present

## 2018-01-17 DIAGNOSIS — Z7189 Other specified counseling: Secondary | ICD-10-CM | POA: Diagnosis not present

## 2018-01-17 DIAGNOSIS — F419 Anxiety disorder, unspecified: Secondary | ICD-10-CM | POA: Diagnosis not present

## 2018-01-17 DIAGNOSIS — R5383 Other fatigue: Secondary | ICD-10-CM | POA: Diagnosis not present

## 2018-01-17 DIAGNOSIS — Z1339 Encounter for screening examination for other mental health and behavioral disorders: Secondary | ICD-10-CM | POA: Diagnosis not present

## 2018-01-17 DIAGNOSIS — I10 Essential (primary) hypertension: Secondary | ICD-10-CM | POA: Diagnosis not present

## 2018-01-17 DIAGNOSIS — M4626 Osteomyelitis of vertebra, lumbar region: Secondary | ICD-10-CM | POA: Diagnosis not present

## 2018-01-17 DIAGNOSIS — Z6828 Body mass index (BMI) 28.0-28.9, adult: Secondary | ICD-10-CM | POA: Diagnosis not present

## 2018-01-17 DIAGNOSIS — Z6829 Body mass index (BMI) 29.0-29.9, adult: Secondary | ICD-10-CM | POA: Diagnosis not present

## 2018-01-17 DIAGNOSIS — E785 Hyperlipidemia, unspecified: Secondary | ICD-10-CM | POA: Diagnosis not present

## 2018-01-17 DIAGNOSIS — Z299 Encounter for prophylactic measures, unspecified: Secondary | ICD-10-CM | POA: Diagnosis not present

## 2018-01-17 DIAGNOSIS — Z1211 Encounter for screening for malignant neoplasm of colon: Secondary | ICD-10-CM | POA: Diagnosis not present

## 2018-01-18 DIAGNOSIS — R5383 Other fatigue: Secondary | ICD-10-CM | POA: Diagnosis not present

## 2018-01-18 DIAGNOSIS — E785 Hyperlipidemia, unspecified: Secondary | ICD-10-CM | POA: Diagnosis not present

## 2018-01-18 DIAGNOSIS — Z79899 Other long term (current) drug therapy: Secondary | ICD-10-CM | POA: Diagnosis not present

## 2018-01-18 DIAGNOSIS — F419 Anxiety disorder, unspecified: Secondary | ICD-10-CM | POA: Diagnosis not present

## 2018-01-30 ENCOUNTER — Other Ambulatory Visit (HOSPITAL_COMMUNITY): Payer: Self-pay | Admitting: *Deleted

## 2018-01-30 DIAGNOSIS — D508 Other iron deficiency anemias: Secondary | ICD-10-CM

## 2018-01-31 ENCOUNTER — Inpatient Hospital Stay (HOSPITAL_COMMUNITY): Payer: PPO | Attending: Hematology

## 2018-01-31 DIAGNOSIS — C9030 Solitary plasmacytoma not having achieved remission: Secondary | ICD-10-CM | POA: Diagnosis not present

## 2018-01-31 DIAGNOSIS — D508 Other iron deficiency anemias: Secondary | ICD-10-CM

## 2018-01-31 LAB — CBC WITH DIFFERENTIAL/PLATELET
Abs Immature Granulocytes: 0.03 10*3/uL (ref 0.00–0.07)
Basophils Absolute: 0 10*3/uL (ref 0.0–0.1)
Basophils Relative: 1 %
Eosinophils Absolute: 0.3 10*3/uL (ref 0.0–0.5)
Eosinophils Relative: 4 %
HCT: 42.5 % (ref 36.0–46.0)
Hemoglobin: 13.2 g/dL (ref 12.0–15.0)
Immature Granulocytes: 0 %
Lymphocytes Relative: 21 %
Lymphs Abs: 1.6 10*3/uL (ref 0.7–4.0)
MCH: 27.4 pg (ref 26.0–34.0)
MCHC: 31.1 g/dL (ref 30.0–36.0)
MCV: 88.2 fL (ref 80.0–100.0)
Monocytes Absolute: 0.4 10*3/uL (ref 0.1–1.0)
Monocytes Relative: 6 %
Neutro Abs: 5.4 10*3/uL (ref 1.7–7.7)
Neutrophils Relative %: 68 %
Platelets: 247 10*3/uL (ref 150–400)
RBC: 4.82 MIL/uL (ref 3.87–5.11)
RDW: 13.2 % (ref 11.5–15.5)
WBC: 7.8 10*3/uL (ref 4.0–10.5)
nRBC: 0 % (ref 0.0–0.2)

## 2018-01-31 LAB — COMPREHENSIVE METABOLIC PANEL
ALT: 19 U/L (ref 0–44)
AST: 19 U/L (ref 15–41)
Albumin: 3.8 g/dL (ref 3.5–5.0)
Alkaline Phosphatase: 64 U/L (ref 38–126)
Anion gap: 4 — ABNORMAL LOW (ref 5–15)
BUN: 13 mg/dL (ref 8–23)
CO2: 27 mmol/L (ref 22–32)
Calcium: 9.5 mg/dL (ref 8.9–10.3)
Chloride: 108 mmol/L (ref 98–111)
Creatinine, Ser: 0.77 mg/dL (ref 0.44–1.00)
GFR calc Af Amer: >60 mL/min (ref >60)
GFR calc non Af Amer: >60 mL/min (ref >60)
Glucose, Bld: 122 mg/dL — ABNORMAL HIGH (ref 70–99)
Potassium: 3.9 mmol/L (ref 3.5–5.1)
Sodium: 139 mmol/L (ref 135–145)
Total Bilirubin: 0.7 mg/dL (ref 0.3–1.2)
Total Protein: 7.1 g/dL (ref 6.5–8.1)

## 2018-01-31 LAB — IRON AND TIBC
Iron: 66 ug/dL (ref 28–170)
Saturation Ratios: 22 % (ref 10.4–31.8)
TIBC: 297 ug/dL (ref 250–450)
UIBC: 231 ug/dL

## 2018-01-31 LAB — FERRITIN: Ferritin: 54 ng/mL (ref 11–307)

## 2018-02-01 ENCOUNTER — Telehealth (HOSPITAL_COMMUNITY): Payer: Self-pay | Admitting: Hematology

## 2018-03-15 DIAGNOSIS — N132 Hydronephrosis with renal and ureteral calculous obstruction: Secondary | ICD-10-CM | POA: Diagnosis not present

## 2018-03-15 DIAGNOSIS — N200 Calculus of kidney: Secondary | ICD-10-CM | POA: Diagnosis not present

## 2018-03-15 DIAGNOSIS — N23 Unspecified renal colic: Secondary | ICD-10-CM | POA: Diagnosis not present

## 2018-03-15 DIAGNOSIS — Z87442 Personal history of urinary calculi: Secondary | ICD-10-CM | POA: Diagnosis not present

## 2018-03-15 DIAGNOSIS — N202 Calculus of kidney with calculus of ureter: Secondary | ICD-10-CM | POA: Diagnosis not present

## 2018-03-15 DIAGNOSIS — I10 Essential (primary) hypertension: Secondary | ICD-10-CM | POA: Diagnosis not present

## 2018-03-15 DIAGNOSIS — Z885 Allergy status to narcotic agent status: Secondary | ICD-10-CM | POA: Diagnosis not present

## 2018-03-15 DIAGNOSIS — Z8614 Personal history of Methicillin resistant Staphylococcus aureus infection: Secondary | ICD-10-CM | POA: Diagnosis not present

## 2018-03-15 DIAGNOSIS — Z881 Allergy status to other antibiotic agents status: Secondary | ICD-10-CM | POA: Diagnosis not present

## 2018-04-02 DIAGNOSIS — N202 Calculus of kidney with calculus of ureter: Secondary | ICD-10-CM | POA: Diagnosis not present

## 2018-04-02 DIAGNOSIS — Z885 Allergy status to narcotic agent status: Secondary | ICD-10-CM | POA: Diagnosis not present

## 2018-04-02 DIAGNOSIS — N132 Hydronephrosis with renal and ureteral calculous obstruction: Secondary | ICD-10-CM | POA: Diagnosis not present

## 2018-04-02 DIAGNOSIS — N201 Calculus of ureter: Secondary | ICD-10-CM | POA: Diagnosis not present

## 2018-04-02 DIAGNOSIS — I10 Essential (primary) hypertension: Secondary | ICD-10-CM | POA: Diagnosis not present

## 2018-04-02 DIAGNOSIS — Z79899 Other long term (current) drug therapy: Secondary | ICD-10-CM | POA: Diagnosis not present

## 2018-04-02 DIAGNOSIS — Z8614 Personal history of Methicillin resistant Staphylococcus aureus infection: Secondary | ICD-10-CM | POA: Diagnosis not present

## 2018-04-15 DIAGNOSIS — Z299 Encounter for prophylactic measures, unspecified: Secondary | ICD-10-CM | POA: Diagnosis not present

## 2018-04-15 DIAGNOSIS — J309 Allergic rhinitis, unspecified: Secondary | ICD-10-CM | POA: Diagnosis not present

## 2018-04-15 DIAGNOSIS — M4626 Osteomyelitis of vertebra, lumbar region: Secondary | ICD-10-CM | POA: Diagnosis not present

## 2018-04-15 DIAGNOSIS — Z6829 Body mass index (BMI) 29.0-29.9, adult: Secondary | ICD-10-CM | POA: Diagnosis not present

## 2018-04-15 DIAGNOSIS — K219 Gastro-esophageal reflux disease without esophagitis: Secondary | ICD-10-CM | POA: Diagnosis not present

## 2018-04-15 DIAGNOSIS — I10 Essential (primary) hypertension: Secondary | ICD-10-CM | POA: Diagnosis not present

## 2018-04-17 ENCOUNTER — Other Ambulatory Visit: Payer: Self-pay | Admitting: Urology

## 2018-04-17 ENCOUNTER — Encounter (HOSPITAL_COMMUNITY): Payer: Self-pay | Admitting: *Deleted

## 2018-04-17 ENCOUNTER — Ambulatory Visit (HOSPITAL_COMMUNITY)
Admission: RE | Admit: 2018-04-17 | Discharge: 2018-04-17 | Disposition: A | Discharge status: Home / Self Care | Payer: PPO | Source: Ambulatory Visit | Attending: Urology | Admitting: Urology

## 2018-04-17 ENCOUNTER — Ambulatory Visit: Payer: PPO | Admitting: Urology

## 2018-04-17 DIAGNOSIS — N202 Calculus of kidney with calculus of ureter: Secondary | ICD-10-CM | POA: Diagnosis not present

## 2018-04-17 DIAGNOSIS — N132 Hydronephrosis with renal and ureteral calculous obstruction: Secondary | ICD-10-CM | POA: Diagnosis not present

## 2018-04-18 ENCOUNTER — Ambulatory Visit (HOSPITAL_COMMUNITY): Payer: PPO

## 2018-04-18 ENCOUNTER — Ambulatory Visit (HOSPITAL_COMMUNITY)
Admission: RE | Admit: 2018-04-18 | Discharge: 2018-04-18 | Disposition: A | Discharge status: Home / Self Care | Payer: PPO | Attending: Urology | Admitting: Urology

## 2018-04-18 ENCOUNTER — Other Ambulatory Visit: Payer: Self-pay

## 2018-04-18 ENCOUNTER — Ambulatory Visit (HOSPITAL_COMMUNITY): Payer: PPO | Admitting: Anesthesiology

## 2018-04-18 ENCOUNTER — Encounter (HOSPITAL_COMMUNITY): Payer: Self-pay | Admitting: *Deleted

## 2018-04-18 ENCOUNTER — Encounter (HOSPITAL_COMMUNITY)
Admission: RE | Disposition: A | Discharge status: Home / Self Care | Payer: Self-pay | Source: Home / Self Care | Attending: Urology

## 2018-04-18 DIAGNOSIS — E876 Hypokalemia: Secondary | ICD-10-CM | POA: Diagnosis not present

## 2018-04-18 DIAGNOSIS — M549 Dorsalgia, unspecified: Secondary | ICD-10-CM | POA: Diagnosis not present

## 2018-04-18 DIAGNOSIS — Z888 Allergy status to other drugs, medicaments and biological substances status: Secondary | ICD-10-CM | POA: Insufficient documentation

## 2018-04-18 DIAGNOSIS — I10 Essential (primary) hypertension: Secondary | ICD-10-CM | POA: Diagnosis not present

## 2018-04-18 DIAGNOSIS — I129 Hypertensive chronic kidney disease with stage 1 through stage 4 chronic kidney disease, or unspecified chronic kidney disease: Secondary | ICD-10-CM | POA: Insufficient documentation

## 2018-04-18 DIAGNOSIS — M810 Age-related osteoporosis without current pathological fracture: Secondary | ICD-10-CM | POA: Insufficient documentation

## 2018-04-18 DIAGNOSIS — G8929 Other chronic pain: Secondary | ICD-10-CM | POA: Insufficient documentation

## 2018-04-18 DIAGNOSIS — D649 Anemia, unspecified: Secondary | ICD-10-CM | POA: Insufficient documentation

## 2018-04-18 DIAGNOSIS — Z87442 Personal history of urinary calculi: Secondary | ICD-10-CM | POA: Insufficient documentation

## 2018-04-18 DIAGNOSIS — N201 Calculus of ureter: Secondary | ICD-10-CM | POA: Diagnosis not present

## 2018-04-18 DIAGNOSIS — Z885 Allergy status to narcotic agent status: Secondary | ICD-10-CM | POA: Diagnosis not present

## 2018-04-18 DIAGNOSIS — K449 Diaphragmatic hernia without obstruction or gangrene: Secondary | ICD-10-CM | POA: Diagnosis not present

## 2018-04-18 DIAGNOSIS — K219 Gastro-esophageal reflux disease without esophagitis: Secondary | ICD-10-CM | POA: Diagnosis not present

## 2018-04-18 DIAGNOSIS — Z79899 Other long term (current) drug therapy: Secondary | ICD-10-CM | POA: Diagnosis not present

## 2018-04-18 DIAGNOSIS — C9000 Multiple myeloma not having achieved remission: Secondary | ICD-10-CM | POA: Insufficient documentation

## 2018-04-18 DIAGNOSIS — N189 Chronic kidney disease, unspecified: Secondary | ICD-10-CM | POA: Insufficient documentation

## 2018-04-18 DIAGNOSIS — Z881 Allergy status to other antibiotic agents status: Secondary | ICD-10-CM | POA: Insufficient documentation

## 2018-04-18 DIAGNOSIS — Z803 Family history of malignant neoplasm of breast: Secondary | ICD-10-CM | POA: Insufficient documentation

## 2018-04-18 DIAGNOSIS — Z9049 Acquired absence of other specified parts of digestive tract: Secondary | ICD-10-CM | POA: Insufficient documentation

## 2018-04-18 DIAGNOSIS — E785 Hyperlipidemia, unspecified: Secondary | ICD-10-CM | POA: Diagnosis not present

## 2018-04-18 DIAGNOSIS — D508 Other iron deficiency anemias: Secondary | ICD-10-CM | POA: Diagnosis not present

## 2018-04-18 HISTORY — PX: HOLMIUM LASER APPLICATION: SHX5852

## 2018-04-18 HISTORY — PX: CYSTOSCOPY/RETROGRADE/URETEROSCOPY/STONE EXTRACTION WITH BASKET: SHX5317

## 2018-04-18 LAB — CBC
HCT: 45.3 % (ref 36.0–46.0)
Hemoglobin: 14 g/dL (ref 12.0–15.0)
MCH: 27.3 pg (ref 26.0–34.0)
MCHC: 30.9 g/dL (ref 30.0–36.0)
MCV: 88.5 fL (ref 80.0–100.0)
Platelets: 328 10*3/uL (ref 150–400)
RBC: 5.12 MIL/uL — ABNORMAL HIGH (ref 3.87–5.11)
RDW: 12.3 % (ref 11.5–15.5)
WBC: 9.4 10*3/uL (ref 4.0–10.5)
nRBC: 0 % (ref 0.0–0.2)

## 2018-04-18 LAB — BASIC METABOLIC PANEL
Anion gap: 9 (ref 5–15)
BUN: 23 mg/dL (ref 8–23)
CO2: 24 mmol/L (ref 22–32)
Calcium: 9.9 mg/dL (ref 8.9–10.3)
Chloride: 109 mmol/L (ref 98–111)
Creatinine, Ser: 0.97 mg/dL (ref 0.44–1.00)
GFR calc Af Amer: >60 mL/min (ref >60)
GFR calc non Af Amer: >60 mL/min (ref >60)
Glucose, Bld: 79 mg/dL (ref 70–99)
Potassium: 3.5 mmol/L (ref 3.5–5.1)
Sodium: 142 mmol/L (ref 135–145)

## 2018-04-18 SURGERY — CYSTOSCOPY, WITH CALCULUS REMOVAL USING BASKET
Anesthesia: General | Site: Ureter | Laterality: Right

## 2018-04-18 MED ORDER — ONDANSETRON 4 MG PO TBDP: 4.0000 mg | ORAL_TABLET | Freq: Three times a day (TID) | ORAL | 0 refills | Status: DC | PRN

## 2018-04-18 MED ORDER — OXYCODONE HCL 5 MG PO TABS: 5.0000 mg | ORAL_TABLET | Freq: Once | ORAL | Status: DC | PRN

## 2018-04-18 MED ORDER — ONDANSETRON HCL 4 MG/2ML IJ SOLN: 4.0000 mg | Freq: Once | INTRAMUSCULAR | Status: DC | PRN

## 2018-04-18 MED ORDER — MEPERIDINE HCL 50 MG/ML IJ SOLN: 6.2500 mg | INTRAMUSCULAR | Status: DC | PRN

## 2018-04-18 MED ORDER — PROPOFOL 10 MG/ML IV BOLUS
INTRAVENOUS | Status: DC | PRN
  Administered 2018-04-18: 120 mg via INTRAVENOUS

## 2018-04-18 MED ORDER — OXYCODONE-ACETAMINOPHEN 5-325 MG PO TABS
ORAL_TABLET | ORAL | Status: AC
  Administered 2018-04-18: 1
  Filled 2018-04-18: qty 1

## 2018-04-18 MED ORDER — IOHEXOL 300 MG/ML  SOLN
INTRAMUSCULAR | Status: DC | PRN
  Administered 2018-04-18: 14 mL via URETHRAL

## 2018-04-18 MED ORDER — LIDOCAINE 2% (20 MG/ML) 5 ML SYRINGE
INTRAMUSCULAR | Status: AC
  Filled 2018-04-18: qty 5

## 2018-04-18 MED ORDER — LIDOCAINE HCL (CARDIAC) PF 100 MG/5ML IV SOSY
PREFILLED_SYRINGE | INTRAVENOUS | Status: DC | PRN
  Administered 2018-04-18: 100 mg via INTRAVENOUS

## 2018-04-18 MED ORDER — MIDAZOLAM HCL 5 MG/5ML IJ SOLN
INTRAMUSCULAR | Status: DC | PRN
  Administered 2018-04-18 (×2): 1 mg via INTRAVENOUS

## 2018-04-18 MED ORDER — FENTANYL CITRATE (PF) 100 MCG/2ML IJ SOLN
INTRAMUSCULAR | Status: DC | PRN
  Administered 2018-04-18 (×2): 50 ug via INTRAVENOUS

## 2018-04-18 MED ORDER — MIDAZOLAM HCL 2 MG/2ML IJ SOLN
INTRAMUSCULAR | Status: AC
  Filled 2018-04-18: qty 2

## 2018-04-18 MED ORDER — TAMSULOSIN HCL 0.4 MG PO CAPS: 0.4000 mg | ORAL_CAPSULE | Freq: Every day | ORAL | 0 refills | Status: DC

## 2018-04-18 MED ORDER — EPHEDRINE SULFATE 50 MG/ML IJ SOLN
INTRAMUSCULAR | Status: DC | PRN
  Administered 2018-04-18 (×2): 10 mg via INTRAVENOUS

## 2018-04-18 MED ORDER — CEFAZOLIN SODIUM-DEXTROSE 2-4 GM/100ML-% IV SOLN
2.0000 g | INTRAVENOUS | Status: AC
  Administered 2018-04-18: 2 g via INTRAVENOUS
  Filled 2018-04-18: qty 100

## 2018-04-18 MED ORDER — ACETAMINOPHEN 160 MG/5ML PO SOLN: 325.0000 mg | ORAL | Status: DC | PRN

## 2018-04-18 MED ORDER — SODIUM CHLORIDE 0.9 % IR SOLN
Status: DC | PRN
  Administered 2018-04-18: 3000 mL

## 2018-04-18 MED ORDER — DEXAMETHASONE SODIUM PHOSPHATE 10 MG/ML IJ SOLN
INTRAMUSCULAR | Status: DC | PRN
  Administered 2018-04-18: 10 mg via INTRAVENOUS

## 2018-04-18 MED ORDER — OXYCODONE HCL 5 MG/5ML PO SOLN: 5.0000 mg | Freq: Once | ORAL | Status: DC | PRN

## 2018-04-18 MED ORDER — FENTANYL CITRATE (PF) 100 MCG/2ML IJ SOLN
INTRAMUSCULAR | Status: AC
  Filled 2018-04-18: qty 2

## 2018-04-18 MED ORDER — ACETAMINOPHEN 325 MG PO TABS: 325.0000 mg | ORAL_TABLET | ORAL | Status: DC | PRN

## 2018-04-18 MED ORDER — FENTANYL CITRATE (PF) 100 MCG/2ML IJ SOLN: 25.0000 ug | INTRAMUSCULAR | Status: DC | PRN

## 2018-04-18 MED ORDER — OXYCODONE-ACETAMINOPHEN 5-325 MG PO TABS: 1.0000 | ORAL_TABLET | ORAL | 0 refills | Status: AC | PRN

## 2018-04-18 MED ORDER — ONDANSETRON HCL 4 MG/2ML IJ SOLN
INTRAMUSCULAR | Status: AC
  Filled 2018-04-18: qty 2

## 2018-04-18 MED ORDER — PROPOFOL 10 MG/ML IV BOLUS
INTRAVENOUS | Status: AC
  Filled 2018-04-18: qty 20

## 2018-04-18 MED ORDER — LACTATED RINGERS IV SOLN
INTRAVENOUS | Status: DC
  Administered 2018-04-18 (×2): via INTRAVENOUS

## 2018-04-18 MED ORDER — ONDANSETRON HCL 4 MG/2ML IJ SOLN
INTRAMUSCULAR | Status: DC | PRN
  Administered 2018-04-18: 4 mg via INTRAVENOUS

## 2018-04-18 SURGICAL SUPPLY — 23 items
BAG URO CATCHER STRL LF (MISCELLANEOUS) ×4 IMPLANT
CATH INTERMIT  6FR 70CM (CATHETERS) ×4 IMPLANT
CLOTH BEACON ORANGE TIMEOUT ST (SAFETY) ×4 IMPLANT
COVER SURGICAL LIGHT HANDLE (MISCELLANEOUS) ×4 IMPLANT
COVER WAND RF STERILE (DRAPES) IMPLANT
EXTRACTOR STONE NITINOL NGAGE (UROLOGICAL SUPPLIES) ×4 IMPLANT
FIBER LASER FLEXIVA 1000 (UROLOGICAL SUPPLIES) IMPLANT
FIBER LASER FLEXIVA 365 (UROLOGICAL SUPPLIES) IMPLANT
FIBER LASER FLEXIVA 550 (UROLOGICAL SUPPLIES) IMPLANT
FIBER LASER TRAC TIP (UROLOGICAL SUPPLIES) ×4 IMPLANT
GLOVE BIO SURGEON STRL SZ8 (GLOVE) ×4 IMPLANT
GOWN STRL REUS W/TWL XL LVL3 (GOWN DISPOSABLE) ×4 IMPLANT
GUIDEWIRE ANG ZIPWIRE 038X150 (WIRE) ×4 IMPLANT
GUIDEWIRE STR DUAL SENSOR (WIRE) IMPLANT
IV NS 1000ML (IV SOLUTION) ×4
IV NS 1000ML BAXH (IV SOLUTION) ×2 IMPLANT
MANIFOLD NEPTUNE II (INSTRUMENTS) ×4 IMPLANT
PACK CYSTO (CUSTOM PROCEDURE TRAY) ×4 IMPLANT
SHEATH URETERAL 12FRX35CM (MISCELLANEOUS) ×4 IMPLANT
STENT URET 6FRX26 CONTOUR (STENTS) ×4 IMPLANT
TUBE FEEDING 8FR 16IN STR KANG (MISCELLANEOUS) IMPLANT
TUBING CONNECTING 10 (TUBING) ×3 IMPLANT
TUBING CONNECTING 10' (TUBING) ×1

## 2018-04-18 NOTE — Discharge Instructions (Signed)

## 2018-04-18 NOTE — Anesthesia Postprocedure Evaluation (Signed)
Anesthesia Post Note  Patient: Autumn Duran  Procedure(s) Performed: CYSTOSCOPY/BILATERAL RETROGRADE/URETEROSCOPY/STONE EXTRACTION WITH BASKET WITH RIGHT STENT PLACEMENT (Bilateral Ureter) HOLMIUM LASER APPLICATION (Right Ureter)     Patient location during evaluation: PACU Anesthesia Type: General Level of consciousness: awake and alert Pain management: pain level controlled Vital Signs Assessment: post-procedure vital signs reviewed and stable Respiratory status: spontaneous breathing, nonlabored ventilation, respiratory function stable and patient connected to nasal cannula oxygen Cardiovascular status: blood pressure returned to baseline and stable Postop Assessment: no apparent nausea or vomiting Anesthetic complications: no    Last Vitals:  Vitals:   04/18/18 1745 04/18/18 1800  BP: (!) 145/80 139/75  Pulse: 71 70  Resp:    Temp:  36.6 C  SpO2: 93% 96%    Last Pain:  Vitals:   04/18/18 1800  TempSrc:   PainSc: 0-No pain                 Sydell Prowell

## 2018-04-18 NOTE — Anesthesia Procedure Notes (Signed)
Procedure Name: LMA Insertion Date/Time: 04/18/2018 3:32 PM Performed by: Janeece Riggers, MD Pre-anesthesia Checklist: Patient identified, Emergency Drugs available, Suction available, Patient being monitored and Timeout performed Patient Re-evaluated:Patient Re-evaluated prior to induction Oxygen Delivery Method: Circle system utilized Preoxygenation: Pre-oxygenation with 100% oxygen Induction Type: IV induction LMA: LMA with gastric port inserted LMA Size: 3.0 Number of attempts: 1 Placement Confirmation: positive ETCO2 and breath sounds checked- equal and bilateral Tube secured with: Tape Dental Injury: Teeth and Oropharynx as per pre-operative assessment

## 2018-04-18 NOTE — H&P (Signed)
Urology Admission H&P  Chief Complaint: bilateral flank pain  History of Present Illness: Autumn Duran is a 68yo with a hx of nephrolithiasis who presented to my office with bilateral flank pain. She was found to have a 65m left distal calculus and a 581mright proximal calculus.   Past Medical History:  Diagnosis Date  . [place] ([place]  . Chronic back pain   . Chronic kidney disease    kidney stone s/p stent placement ( removed)  . Complication of anesthesia   . Compression fracture of L3 lumbar vertebra   . Diverticulosis   . GERD (gastroesophageal reflux disease)   . Hemorrhoids   . Hiatal hernia   . History of kidney stones   . Hx of adenomatous colonic polyps   . Hyperlipidemia   . Hypertension   . Lumbar radicular pain   . Osteoporosis 12/10/2017  . PONV (postoperative nausea and vomiting)   . [place]ing [place]oma ([place]  . Sore throat   . [facility]   Past Surgical History:  Procedure Laterality Date  . BACK SURGERY    . BONE MARROW ASPIRATION Left 02/2014  . BONE MARROW BIOPSY Left 02/2014  . CHOLECYSTECTOMY    . COLONOSCOPY  12/18/2011   Procedure: COLONOSCOPY;  Surgeon: DoLafayette DragonMD;  [facility];  Service: Endoscopy;  Laterality: N/A;  . COLONOSCOPY WITH PROPOFOL N/A 08/03/2017   Procedure: COLONOSCOPY WITH PROPOFOL;  Surgeon: ReRogene HoustonMD;  [facility];  Service: Endoscopy;  Laterality: N/A;  . ESOPHAGEAL DILATION N/A 08/03/2017   Procedure: ESOPHAGEAL DILATION;  Surgeon: ReRogene HoustonMD;  [facility];  Service: Endoscopy;  Laterality: N/A;  . ESOPHAGOGASTRODUODENOSCOPY  12/18/2011   Procedure: ESOPHAGOGASTRODUODENOSCOPY (EGD);  Surgeon: DoLafayette DragonMD;  [facility];  Service: Endoscopy;  Laterality: N/A;  . ESOPHAGOGASTRODUODENOSCOPY (EGD) WITH PROPOFOL N/A 08/03/2017   Procedure: ESOPHAGOGASTRODUODENOSCOPY (EGD) WITH PROPOFOL;  Surgeon: ReRogene HoustonMD;  [facility];  Service: Endoscopy;  Laterality: N/A;  pt knows to arrive at 6:15  . HERNIA REPAIR    . LUMBAR LAMINECTOMY FOR EPIDURAL ABSCESS Left 09/22/2013   Procedure: LUMBAR LAMINECTOMY FOR EPIDURAL ABSCESS LEFT LUMBAR FIVE-SACRAL ONE;  Surgeon: HeCharlie PitterMD;  [facility];  Service: Neurosurgery;  Laterality: Left;  . RADIOLOGY WITH ANESTHESIA N/A 08/13/2014   Procedure: RADIOLOGY WITH ANESTHESIA ;  Surgeon: Medication Radiologist, MD;  [facility];  Service: Radiology;  Laterality: N/A;  . URETERAL STENT PLACEMENT    . VERTEBROPLASTY N/A 07/14/2013   Procedure: VERTEBROPLASTY WITH LUMBAR THREE BIOPSY;  Surgeon: HeCharlie PitterMD;  [facility];  Service: Neurosurgery;  Laterality: N/A;  VERTEBROPLASTY WITH LUMBAR THREE BIOPSY    Home Medications:  Current Facility-Administered Medications  Medication Dose Route Frequency Provider Last Rate Last Dose  . ceFAZolin (ANCEF) IVPB 2g/100 mL premix  2 g Intravenous 30 min Pre-Op McAlyson InglesaCandee FurbishMD      . lactated ringers infusion   Intravenous Continuous RoMyrtie SomanMD 100 mL/hr at 04/18/18 1421     Allergies:  Allergies  Allergen Reactions  . Chlorhexidine Gluconate Itching    Pt c/o itching after abd wipes.   . Morphine And Related Nausea And Vomiting    Headache  . Tetracycline Nausea Only    Family History  Problem Relation Age of Onset  . Breast cancer Sister 4868. Breast cancer  Other 32       aunt   Social History:  reports that she has never smoked. She has never used smokeless tobacco. She reports current alcohol use. She reports that she does not use drugs.  Review of Systems  All other systems reviewed and are negative.   Physical Exam:  Vital signs in last 24 hours: Temp:  [98 F (36.7 C)] 98 F (36.7 C) (02/20 1329) Pulse Rate:  [67] 67 (02/20 1329) Resp:  [16] 16 (02/20 1329) BP: (149)/(81) 149/81 (02/20 1329) SpO2:  [97 %] 97 % (02/20 1329) Weight:  [70.9 kg] 70.9 kg (02/20  1329) Physical Exam  Constitutional: She is oriented to person, place, and time. She appears well-developed and well-nourished.  HENT:  Head: Normocephalic and atraumatic.  Eyes: Pupils are equal, round, and reactive to light. EOM are normal.  Neck: Normal range of motion. No thyromegaly present.  Cardiovascular: Normal rate and regular rhythm.  Respiratory: Effort normal. No respiratory distress.  GI: Soft. She exhibits no distension.  Musculoskeletal: Normal range of motion.        General: No edema.  Neurological: She is alert and oriented to person, place, and time.  Skin: Skin is warm and dry.  Psychiatric: She has a normal mood and affect. Her behavior is normal. Judgment and thought content normal.    Laboratory Data:  Results for orders placed or performed during the hospital encounter of 04/18/18 (from the past 24 hour(s))  Basic metabolic panel     Status: None   Collection Time: 04/18/18  2:01 PM  Result Value Ref Range   Sodium 142 135 - 145 mmol/L   Potassium 3.5 3.5 - 5.1 mmol/L   Chloride 109 98 - 111 mmol/L   CO2 24 22 - 32 mmol/L   Glucose, Bld 79 70 - 99 mg/dL   BUN 23 8 - 23 mg/dL   Creatinine, Ser 0.97 0.44 - 1.00 mg/dL   Calcium 9.9 8.9 - 10.3 mg/dL   GFR calc non Af Amer >60 >60 mL/min   GFR calc Af Amer >60 >60 mL/min   Anion gap 9 5 - 15  CBC     Status: Abnormal   Collection Time: 04/18/18  2:01 PM  Result Value Ref Range   WBC 9.4 4.0 - 10.5 K/uL   RBC 5.12 (H) 3.87 - 5.11 MIL/uL   Hemoglobin 14.0 12.0 - 15.0 g/dL   HCT 45.3 36.0 - 46.0 %   MCV 88.5 80.0 - 100.0 fL   MCH 27.3 26.0 - 34.0 pg   MCHC 30.9 30.0 - 36.0 g/dL   RDW 12.3 11.5 - 15.5 %   Platelets 328 150 - 400 K/uL   nRBC 0.0 0.0 - 0.2 %   No results found for this or any previous visit (from the past 240 hour(s)). Creatinine: Recent Labs    04/18/18 1401  CREATININE 0.97   Baseline Creatinine: 1  Impression/Assessment:  67yo with bilateral ureteral calculi  Plan:  The  risks/benefits/alternatives to bilateral ureteroscopic stone extraction was explained to the patient and she understands and wishes to proceed with surgery  Autumn Duran 04/18/2018, 3:23 PM

## 2018-04-18 NOTE — Anesthesia Preprocedure Evaluation (Signed)
Anesthesia Evaluation  Patient identified by MRN, date of birth, ID band Patient awake    Reviewed: Allergy & Precautions, H&P , NPO status , Patient's Chart, lab work & pertinent test results, reviewed documented beta blocker date and time   History of Anesthesia Complications (+) PONV and history of anesthetic complications  Airway Mallampati: II  TM Distance: >3 FB Neck ROM: full    Dental no notable dental hx.    Pulmonary neg pulmonary ROS,    Pulmonary exam normal breath sounds clear to auscultation       Cardiovascular Exercise Tolerance: Good Pt. on medications negative cardio ROS   Rhythm:regular Rate:Normal     Neuro/Psych  Neuromuscular disease negative psych ROS   GI/Hepatic Neg liver ROS, hiatal hernia, GERD  Medicated,  Endo/Other  negative endocrine ROS  Renal/GU Renal disease  negative genitourinary   Musculoskeletal  (+) Arthritis , Osteoarthritis,    Abdominal   Peds  Hematology  (+) Blood dyscrasia, anemia ,   Anesthesia Other Findings   Reproductive/Obstetrics negative OB ROS                             Anesthesia Physical  Anesthesia Plan  ASA: II  Anesthesia Plan: General   Post-op Pain Management:    Induction: Intravenous  PONV Risk Score and Plan: 3 and Ondansetron, Treatment may vary due to age or medical condition, Dexamethasone, Propofol infusion and Midazolam  Airway Management Planned: LMA  Additional Equipment:   Intra-op Plan:   Post-operative Plan: Extubation in OR  Informed Consent: I have reviewed the patients History and Physical, chart, labs and discussed the procedure including the risks, benefits and alternatives for the proposed anesthesia with the patient or authorized representative who has indicated his/her understanding and acceptance.     Dental Advisory Given  Plan Discussed with: CRNA, Anesthesiologist and  Surgeon  Anesthesia Plan Comments: ( )        Anesthesia Quick Evaluation

## 2018-04-18 NOTE — Op Note (Signed)
.  Preoperative diagnosis: bilateral ureteral calculi  Postoperative diagnosis: Same  Procedure: 1 cystoscopy 2. Bilateral retrograde pyelography 3.  Intraoperative fluoroscopy, under one hour, with interpretation 4.  Bilateral ureteroscopic stone manipulation with laser lithotripsy and basket extraction 5.  right 6 x 24 JJ stent placement  Attending: Rosie Fate  Anesthesia: General  Estimated blood loss: None  Drains: right 6 x 24 JJ ureteral stent without tether  Specimens: stone for analysis  Antibiotics: ancef  Findings: left distal ureteral calculus, right renal pelvis calculus. No hydronephrosis. No masses/lesions in the bladder. Ureteral orifices in normal anatomic location.  Indications: Patient is a 68 year old female with a history of bilateral ureteral calculi and bilateral flank pain. After discussing treatment options, they decided proceed with bilateral ureteroscopic stone manipulation.  Procedure her in detail: The patient was brought to the operating room and a brief timeout was done to ensure correct patient, correct procedure, correct site.  General anesthesia was administered patient was placed in dorsal lithotomy position.  Her genitalia was then prepped and draped in usual sterile fashion.  A rigid 72 French cystoscope was passed in the urethra and the bladder.  Bladder was inspected free masses or lesions.  the ureteral orifices were in the normal orthotopic locations. a 6 french ureteral catheter was then instilled into the left ureteral orifice.  a gentle retrograde was obtained and findings noted above. We then advanced a zipwire through the catheter and up to the renal pelvis.  we then removed the cystoscope and cannulated the left ureteral orifice with a semirigid ureteroscope.  We located a stone in the distal ureter which was removed with an NGage basket. Since this was an uncomplicated ureteroscopy we elected not to leave a stent. We then turned out  attention to the right side. 6 french ureteral catheter was then instilled into the right ureteral orifice.  a gentle retrograde was obtained and findings noted above. We then advanced a zipwire through the catheter and up to the renal pelvis.  we then removed the cystoscope and cannulated the left ureteral orifice with a semirigid ureteroscope.  We located no stone in the ureter. We then placed a sensor wire up to the renal pelvis. We removed the scope and advanced a 12/14 x 35cm access sheath up to the renal pelvis. We then used the flexible ureteroscope to perform nephroscopy. We located calculi in the renal pelvis. Using a 200nm laser fiber the stone was fragmented and the fragments were removed with an NGage basket.. Once the stone were removed we then removed the access sheath under direct vision and noted to injury to the ureter. we then placed a 6 x 24 double-j ureteral stent over the original zip wire.  We then removed the wire and good coil was noted in the the renal pelvis under fluoroscopy and the bladder under direct vision.   the bladder was then drained and this concluded the procedure which was well tolerated by patient.  Complications: None  Condition: Stable, extubated, transferred to PACU  Plan: Patient is to be discharged home as to follow-up in 1 week for stent removal

## 2018-04-18 NOTE — Transfer of Care (Signed)
Immediate Anesthesia Transfer of Care Note  Patient: BRIETTA MANSO  Procedure(s) Performed: CYSTOSCOPY/BILATERAL RETROGRADE/URETEROSCOPY/STONE EXTRACTION WITH BASKET WITH RIGHT STENT PLACEMENT (Bilateral Ureter) HOLMIUM LASER APPLICATION (Right Ureter)  Patient Location: PACU  Anesthesia Type:General  Level of Consciousness: awake, oriented, drowsy, patient cooperative and responds to stimulation  Airway & Oxygen Therapy: Patient Spontanous Breathing and Patient connected to face mask oxygen  Post-op Assessment: Report given to RN, Post -op Vital signs reviewed and stable and Patient moving all extremities  Post vital signs: Reviewed and stable  Last Vitals:  Vitals Value Taken Time  BP 137/64 04/18/2018  4:26 PM  Temp    Pulse 73 04/18/2018  4:29 PM  Resp 11 04/18/2018  4:29 PM  SpO2 97 % 04/18/2018  4:29 PM  Vitals shown include unvalidated device data.  Last Pain:  Vitals:   04/18/18 1406  TempSrc:   PainSc: 0-No pain         Complications: No apparent anesthesia complications

## 2018-04-19 ENCOUNTER — Encounter (HOSPITAL_COMMUNITY): Payer: Self-pay | Admitting: Urology

## 2018-04-19 LAB — SURGICAL PCR SCREEN
MRSA, PCR: POSITIVE — AB
Staphylococcus aureus: POSITIVE — AB

## 2018-04-24 ENCOUNTER — Ambulatory Visit (INDEPENDENT_AMBULATORY_CARE_PROVIDER_SITE_OTHER): Payer: PPO | Admitting: Urology

## 2018-04-24 DIAGNOSIS — N202 Calculus of kidney with calculus of ureter: Secondary | ICD-10-CM | POA: Diagnosis not present

## 2018-04-25 ENCOUNTER — Other Ambulatory Visit: Payer: Self-pay | Admitting: Urology

## 2018-04-25 DIAGNOSIS — N202 Calculus of kidney with calculus of ureter: Secondary | ICD-10-CM

## 2018-05-02 ENCOUNTER — Ambulatory Visit (HOSPITAL_COMMUNITY)
Admission: RE | Admit: 2018-05-02 | Discharge: 2018-05-02 | Disposition: A | Discharge status: Home / Self Care | Payer: PPO | Source: Ambulatory Visit | Attending: Urology | Admitting: Urology

## 2018-05-02 DIAGNOSIS — N202 Calculus of kidney with calculus of ureter: Secondary | ICD-10-CM | POA: Insufficient documentation

## 2018-05-02 DIAGNOSIS — N132 Hydronephrosis with renal and ureteral calculous obstruction: Secondary | ICD-10-CM | POA: Diagnosis not present

## 2018-06-01 DIAGNOSIS — I10 Essential (primary) hypertension: Secondary | ICD-10-CM | POA: Diagnosis not present

## 2018-06-01 DIAGNOSIS — S42254A Nondisplaced fracture of greater tuberosity of right humerus, initial encounter for closed fracture: Secondary | ICD-10-CM | POA: Diagnosis not present

## 2018-06-01 DIAGNOSIS — E559 Vitamin D deficiency, unspecified: Secondary | ICD-10-CM | POA: Diagnosis not present

## 2018-06-04 ENCOUNTER — Other Ambulatory Visit (HOSPITAL_COMMUNITY): Payer: PPO

## 2018-06-05 DIAGNOSIS — Z7901 Long term (current) use of anticoagulants: Secondary | ICD-10-CM | POA: Diagnosis not present

## 2018-06-05 DIAGNOSIS — S42251A Displaced fracture of greater tuberosity of right humerus, initial encounter for closed fracture: Secondary | ICD-10-CM | POA: Diagnosis not present

## 2018-06-05 DIAGNOSIS — C7951 Secondary malignant neoplasm of bone: Secondary | ICD-10-CM | POA: Diagnosis not present

## 2018-06-05 DIAGNOSIS — C7931 Secondary malignant neoplasm of brain: Secondary | ICD-10-CM | POA: Diagnosis not present

## 2018-06-05 DIAGNOSIS — C3490 Malignant neoplasm of unspecified part of unspecified bronchus or lung: Secondary | ICD-10-CM | POA: Diagnosis not present

## 2018-06-11 ENCOUNTER — Ambulatory Visit (HOSPITAL_COMMUNITY): Payer: PPO | Admitting: Nurse Practitioner

## 2018-06-26 DIAGNOSIS — S42201A Unspecified fracture of upper end of right humerus, initial encounter for closed fracture: Secondary | ICD-10-CM | POA: Insufficient documentation

## 2018-06-26 DIAGNOSIS — M25511 Pain in right shoulder: Secondary | ICD-10-CM | POA: Diagnosis not present

## 2018-06-26 DIAGNOSIS — S42251D Displaced fracture of greater tuberosity of right humerus, subsequent encounter for fracture with routine healing: Secondary | ICD-10-CM | POA: Diagnosis not present

## 2018-07-10 DIAGNOSIS — D225 Melanocytic nevi of trunk: Secondary | ICD-10-CM | POA: Diagnosis not present

## 2018-07-10 DIAGNOSIS — Z299 Encounter for prophylactic measures, unspecified: Secondary | ICD-10-CM | POA: Diagnosis not present

## 2018-07-11 DIAGNOSIS — M4626 Osteomyelitis of vertebra, lumbar region: Secondary | ICD-10-CM | POA: Diagnosis not present

## 2018-07-15 DIAGNOSIS — I10 Essential (primary) hypertension: Secondary | ICD-10-CM | POA: Diagnosis not present

## 2018-07-15 DIAGNOSIS — Z6830 Body mass index (BMI) 30.0-30.9, adult: Secondary | ICD-10-CM | POA: Diagnosis not present

## 2018-07-15 DIAGNOSIS — H9209 Otalgia, unspecified ear: Secondary | ICD-10-CM | POA: Diagnosis not present

## 2018-07-15 DIAGNOSIS — Z713 Dietary counseling and surveillance: Secondary | ICD-10-CM | POA: Diagnosis not present

## 2018-07-17 DIAGNOSIS — M25511 Pain in right shoulder: Secondary | ICD-10-CM | POA: Diagnosis not present

## 2018-07-17 DIAGNOSIS — S42251D Displaced fracture of greater tuberosity of right humerus, subsequent encounter for fracture with routine healing: Secondary | ICD-10-CM | POA: Diagnosis not present

## 2018-07-25 DIAGNOSIS — M799 Soft tissue disorder, unspecified: Secondary | ICD-10-CM | POA: Diagnosis not present

## 2018-07-25 DIAGNOSIS — M6281 Muscle weakness (generalized): Secondary | ICD-10-CM | POA: Diagnosis not present

## 2018-07-25 DIAGNOSIS — M25611 Stiffness of right shoulder, not elsewhere classified: Secondary | ICD-10-CM | POA: Diagnosis not present

## 2018-07-25 DIAGNOSIS — S42251D Displaced fracture of greater tuberosity of right humerus, subsequent encounter for fracture with routine healing: Secondary | ICD-10-CM | POA: Diagnosis not present

## 2018-07-26 DIAGNOSIS — M25611 Stiffness of right shoulder, not elsewhere classified: Secondary | ICD-10-CM | POA: Diagnosis not present

## 2018-07-26 DIAGNOSIS — M6281 Muscle weakness (generalized): Secondary | ICD-10-CM | POA: Diagnosis not present

## 2018-07-26 DIAGNOSIS — M799 Soft tissue disorder, unspecified: Secondary | ICD-10-CM | POA: Diagnosis not present

## 2018-07-26 DIAGNOSIS — S42251D Displaced fracture of greater tuberosity of right humerus, subsequent encounter for fracture with routine healing: Secondary | ICD-10-CM | POA: Diagnosis not present

## 2018-07-29 DIAGNOSIS — M25611 Stiffness of right shoulder, not elsewhere classified: Secondary | ICD-10-CM | POA: Diagnosis not present

## 2018-07-29 DIAGNOSIS — M799 Soft tissue disorder, unspecified: Secondary | ICD-10-CM | POA: Diagnosis not present

## 2018-07-29 DIAGNOSIS — M6281 Muscle weakness (generalized): Secondary | ICD-10-CM | POA: Diagnosis not present

## 2018-07-29 DIAGNOSIS — S42251D Displaced fracture of greater tuberosity of right humerus, subsequent encounter for fracture with routine healing: Secondary | ICD-10-CM | POA: Diagnosis not present

## 2018-07-31 DIAGNOSIS — M25611 Stiffness of right shoulder, not elsewhere classified: Secondary | ICD-10-CM | POA: Diagnosis not present

## 2018-07-31 DIAGNOSIS — M6281 Muscle weakness (generalized): Secondary | ICD-10-CM | POA: Diagnosis not present

## 2018-07-31 DIAGNOSIS — M799 Soft tissue disorder, unspecified: Secondary | ICD-10-CM | POA: Diagnosis not present

## 2018-07-31 DIAGNOSIS — S42251D Displaced fracture of greater tuberosity of right humerus, subsequent encounter for fracture with routine healing: Secondary | ICD-10-CM | POA: Diagnosis not present

## 2018-08-02 DIAGNOSIS — M799 Soft tissue disorder, unspecified: Secondary | ICD-10-CM | POA: Diagnosis not present

## 2018-08-02 DIAGNOSIS — S42251D Displaced fracture of greater tuberosity of right humerus, subsequent encounter for fracture with routine healing: Secondary | ICD-10-CM | POA: Diagnosis not present

## 2018-08-02 DIAGNOSIS — M6281 Muscle weakness (generalized): Secondary | ICD-10-CM | POA: Diagnosis not present

## 2018-08-02 DIAGNOSIS — M25611 Stiffness of right shoulder, not elsewhere classified: Secondary | ICD-10-CM | POA: Diagnosis not present

## 2018-08-05 DIAGNOSIS — S42251D Displaced fracture of greater tuberosity of right humerus, subsequent encounter for fracture with routine healing: Secondary | ICD-10-CM | POA: Diagnosis not present

## 2018-08-05 DIAGNOSIS — M799 Soft tissue disorder, unspecified: Secondary | ICD-10-CM | POA: Diagnosis not present

## 2018-08-05 DIAGNOSIS — M25611 Stiffness of right shoulder, not elsewhere classified: Secondary | ICD-10-CM | POA: Diagnosis not present

## 2018-08-05 DIAGNOSIS — M6281 Muscle weakness (generalized): Secondary | ICD-10-CM | POA: Diagnosis not present

## 2018-08-07 DIAGNOSIS — M25611 Stiffness of right shoulder, not elsewhere classified: Secondary | ICD-10-CM | POA: Diagnosis not present

## 2018-08-07 DIAGNOSIS — S42251D Displaced fracture of greater tuberosity of right humerus, subsequent encounter for fracture with routine healing: Secondary | ICD-10-CM | POA: Diagnosis not present

## 2018-08-07 DIAGNOSIS — M799 Soft tissue disorder, unspecified: Secondary | ICD-10-CM | POA: Diagnosis not present

## 2018-08-07 DIAGNOSIS — M6281 Muscle weakness (generalized): Secondary | ICD-10-CM | POA: Diagnosis not present

## 2018-08-10 DIAGNOSIS — Z888 Allergy status to other drugs, medicaments and biological substances status: Secondary | ICD-10-CM | POA: Diagnosis not present

## 2018-08-10 DIAGNOSIS — N201 Calculus of ureter: Secondary | ICD-10-CM | POA: Diagnosis not present

## 2018-08-10 DIAGNOSIS — Z8614 Personal history of Methicillin resistant Staphylococcus aureus infection: Secondary | ICD-10-CM | POA: Diagnosis not present

## 2018-08-10 DIAGNOSIS — Z885 Allergy status to narcotic agent status: Secondary | ICD-10-CM | POA: Diagnosis not present

## 2018-08-10 DIAGNOSIS — R112 Nausea with vomiting, unspecified: Secondary | ICD-10-CM | POA: Diagnosis not present

## 2018-08-10 DIAGNOSIS — I10 Essential (primary) hypertension: Secondary | ICD-10-CM | POA: Diagnosis not present

## 2018-08-10 DIAGNOSIS — Z87442 Personal history of urinary calculi: Secondary | ICD-10-CM | POA: Diagnosis not present

## 2018-08-10 DIAGNOSIS — N132 Hydronephrosis with renal and ureteral calculous obstruction: Secondary | ICD-10-CM | POA: Diagnosis not present

## 2018-08-10 DIAGNOSIS — R109 Unspecified abdominal pain: Secondary | ICD-10-CM | POA: Diagnosis not present

## 2018-08-10 DIAGNOSIS — J479 Bronchiectasis, uncomplicated: Secondary | ICD-10-CM | POA: Diagnosis not present

## 2018-08-10 DIAGNOSIS — N200 Calculus of kidney: Secondary | ICD-10-CM | POA: Diagnosis not present

## 2018-08-10 DIAGNOSIS — N1330 Unspecified hydronephrosis: Secondary | ICD-10-CM | POA: Diagnosis not present

## 2018-08-10 DIAGNOSIS — Z79899 Other long term (current) drug therapy: Secondary | ICD-10-CM | POA: Diagnosis not present

## 2018-08-14 DIAGNOSIS — M25511 Pain in right shoulder: Secondary | ICD-10-CM | POA: Diagnosis not present

## 2018-08-14 DIAGNOSIS — M6281 Muscle weakness (generalized): Secondary | ICD-10-CM | POA: Diagnosis not present

## 2018-08-14 DIAGNOSIS — S42251D Displaced fracture of greater tuberosity of right humerus, subsequent encounter for fracture with routine healing: Secondary | ICD-10-CM | POA: Diagnosis not present

## 2018-08-14 DIAGNOSIS — M25611 Stiffness of right shoulder, not elsewhere classified: Secondary | ICD-10-CM | POA: Diagnosis not present

## 2018-08-14 DIAGNOSIS — M799 Soft tissue disorder, unspecified: Secondary | ICD-10-CM | POA: Diagnosis not present

## 2018-08-16 DIAGNOSIS — M799 Soft tissue disorder, unspecified: Secondary | ICD-10-CM | POA: Diagnosis not present

## 2018-08-16 DIAGNOSIS — M6281 Muscle weakness (generalized): Secondary | ICD-10-CM | POA: Diagnosis not present

## 2018-08-16 DIAGNOSIS — M25611 Stiffness of right shoulder, not elsewhere classified: Secondary | ICD-10-CM | POA: Diagnosis not present

## 2018-08-19 DIAGNOSIS — M6281 Muscle weakness (generalized): Secondary | ICD-10-CM | POA: Diagnosis not present

## 2018-08-19 DIAGNOSIS — M25611 Stiffness of right shoulder, not elsewhere classified: Secondary | ICD-10-CM | POA: Diagnosis not present

## 2018-08-19 DIAGNOSIS — M799 Soft tissue disorder, unspecified: Secondary | ICD-10-CM | POA: Diagnosis not present

## 2018-08-21 DIAGNOSIS — M25611 Stiffness of right shoulder, not elsewhere classified: Secondary | ICD-10-CM | POA: Diagnosis not present

## 2018-08-21 DIAGNOSIS — M6281 Muscle weakness (generalized): Secondary | ICD-10-CM | POA: Diagnosis not present

## 2018-08-21 DIAGNOSIS — M799 Soft tissue disorder, unspecified: Secondary | ICD-10-CM | POA: Diagnosis not present

## 2018-08-23 DIAGNOSIS — M25611 Stiffness of right shoulder, not elsewhere classified: Secondary | ICD-10-CM | POA: Diagnosis not present

## 2018-08-23 DIAGNOSIS — M6281 Muscle weakness (generalized): Secondary | ICD-10-CM | POA: Diagnosis not present

## 2018-08-23 DIAGNOSIS — M799 Soft tissue disorder, unspecified: Secondary | ICD-10-CM | POA: Diagnosis not present

## 2018-09-02 DIAGNOSIS — M6281 Muscle weakness (generalized): Secondary | ICD-10-CM | POA: Diagnosis not present

## 2018-09-02 DIAGNOSIS — M799 Soft tissue disorder, unspecified: Secondary | ICD-10-CM | POA: Diagnosis not present

## 2018-09-02 DIAGNOSIS — M25611 Stiffness of right shoulder, not elsewhere classified: Secondary | ICD-10-CM | POA: Diagnosis not present

## 2018-09-04 DIAGNOSIS — M25611 Stiffness of right shoulder, not elsewhere classified: Secondary | ICD-10-CM | POA: Diagnosis not present

## 2018-09-04 DIAGNOSIS — M6281 Muscle weakness (generalized): Secondary | ICD-10-CM | POA: Diagnosis not present

## 2018-09-04 DIAGNOSIS — M799 Soft tissue disorder, unspecified: Secondary | ICD-10-CM | POA: Diagnosis not present

## 2018-09-06 DIAGNOSIS — M25611 Stiffness of right shoulder, not elsewhere classified: Secondary | ICD-10-CM | POA: Diagnosis not present

## 2018-09-06 DIAGNOSIS — M6281 Muscle weakness (generalized): Secondary | ICD-10-CM | POA: Diagnosis not present

## 2018-09-06 DIAGNOSIS — M799 Soft tissue disorder, unspecified: Secondary | ICD-10-CM | POA: Diagnosis not present

## 2018-09-09 DIAGNOSIS — M799 Soft tissue disorder, unspecified: Secondary | ICD-10-CM | POA: Diagnosis not present

## 2018-09-09 DIAGNOSIS — M6281 Muscle weakness (generalized): Secondary | ICD-10-CM | POA: Diagnosis not present

## 2018-09-09 DIAGNOSIS — M25611 Stiffness of right shoulder, not elsewhere classified: Secondary | ICD-10-CM | POA: Diagnosis not present

## 2018-09-13 DIAGNOSIS — M6281 Muscle weakness (generalized): Secondary | ICD-10-CM | POA: Diagnosis not present

## 2018-09-13 DIAGNOSIS — M799 Soft tissue disorder, unspecified: Secondary | ICD-10-CM | POA: Diagnosis not present

## 2018-09-13 DIAGNOSIS — M25611 Stiffness of right shoulder, not elsewhere classified: Secondary | ICD-10-CM | POA: Diagnosis not present

## 2018-09-16 DIAGNOSIS — M6281 Muscle weakness (generalized): Secondary | ICD-10-CM | POA: Diagnosis not present

## 2018-09-16 DIAGNOSIS — M799 Soft tissue disorder, unspecified: Secondary | ICD-10-CM | POA: Diagnosis not present

## 2018-09-16 DIAGNOSIS — M25611 Stiffness of right shoulder, not elsewhere classified: Secondary | ICD-10-CM | POA: Diagnosis not present

## 2018-09-23 DIAGNOSIS — M6281 Muscle weakness (generalized): Secondary | ICD-10-CM | POA: Diagnosis not present

## 2018-09-23 DIAGNOSIS — M799 Soft tissue disorder, unspecified: Secondary | ICD-10-CM | POA: Diagnosis not present

## 2018-09-23 DIAGNOSIS — M25611 Stiffness of right shoulder, not elsewhere classified: Secondary | ICD-10-CM | POA: Diagnosis not present

## 2018-09-25 DIAGNOSIS — S42251D Displaced fracture of greater tuberosity of right humerus, subsequent encounter for fracture with routine healing: Secondary | ICD-10-CM | POA: Diagnosis not present

## 2018-09-27 DIAGNOSIS — M6281 Muscle weakness (generalized): Secondary | ICD-10-CM | POA: Diagnosis not present

## 2018-09-27 DIAGNOSIS — M25611 Stiffness of right shoulder, not elsewhere classified: Secondary | ICD-10-CM | POA: Diagnosis not present

## 2018-09-27 DIAGNOSIS — M799 Soft tissue disorder, unspecified: Secondary | ICD-10-CM | POA: Diagnosis not present

## 2018-09-30 DIAGNOSIS — M799 Soft tissue disorder, unspecified: Secondary | ICD-10-CM | POA: Diagnosis not present

## 2018-09-30 DIAGNOSIS — M6281 Muscle weakness (generalized): Secondary | ICD-10-CM | POA: Diagnosis not present

## 2018-09-30 DIAGNOSIS — M25611 Stiffness of right shoulder, not elsewhere classified: Secondary | ICD-10-CM | POA: Diagnosis not present

## 2018-10-02 DIAGNOSIS — N200 Calculus of kidney: Secondary | ICD-10-CM | POA: Diagnosis not present

## 2018-10-02 DIAGNOSIS — M4626 Osteomyelitis of vertebra, lumbar region: Secondary | ICD-10-CM | POA: Diagnosis not present

## 2018-10-02 DIAGNOSIS — R1084 Generalized abdominal pain: Secondary | ICD-10-CM | POA: Diagnosis not present

## 2018-10-04 DIAGNOSIS — M25611 Stiffness of right shoulder, not elsewhere classified: Secondary | ICD-10-CM | POA: Diagnosis not present

## 2018-10-04 DIAGNOSIS — M799 Soft tissue disorder, unspecified: Secondary | ICD-10-CM | POA: Diagnosis not present

## 2018-10-04 DIAGNOSIS — M6281 Muscle weakness (generalized): Secondary | ICD-10-CM | POA: Diagnosis not present

## 2018-10-09 DIAGNOSIS — M6281 Muscle weakness (generalized): Secondary | ICD-10-CM | POA: Diagnosis not present

## 2018-10-09 DIAGNOSIS — M25611 Stiffness of right shoulder, not elsewhere classified: Secondary | ICD-10-CM | POA: Diagnosis not present

## 2018-10-09 DIAGNOSIS — M799 Soft tissue disorder, unspecified: Secondary | ICD-10-CM | POA: Diagnosis not present

## 2018-10-14 DIAGNOSIS — M799 Soft tissue disorder, unspecified: Secondary | ICD-10-CM | POA: Diagnosis not present

## 2018-10-14 DIAGNOSIS — M25611 Stiffness of right shoulder, not elsewhere classified: Secondary | ICD-10-CM | POA: Diagnosis not present

## 2018-10-14 DIAGNOSIS — M6281 Muscle weakness (generalized): Secondary | ICD-10-CM | POA: Diagnosis not present

## 2018-10-18 DIAGNOSIS — M25611 Stiffness of right shoulder, not elsewhere classified: Secondary | ICD-10-CM | POA: Diagnosis not present

## 2018-10-18 DIAGNOSIS — M6281 Muscle weakness (generalized): Secondary | ICD-10-CM | POA: Diagnosis not present

## 2018-10-18 DIAGNOSIS — M799 Soft tissue disorder, unspecified: Secondary | ICD-10-CM | POA: Diagnosis not present

## 2018-10-23 DIAGNOSIS — M25611 Stiffness of right shoulder, not elsewhere classified: Secondary | ICD-10-CM | POA: Diagnosis not present

## 2018-10-23 DIAGNOSIS — M6281 Muscle weakness (generalized): Secondary | ICD-10-CM | POA: Diagnosis not present

## 2018-10-23 DIAGNOSIS — M799 Soft tissue disorder, unspecified: Secondary | ICD-10-CM | POA: Diagnosis not present

## 2018-10-25 DIAGNOSIS — M799 Soft tissue disorder, unspecified: Secondary | ICD-10-CM | POA: Diagnosis not present

## 2018-10-25 DIAGNOSIS — M6281 Muscle weakness (generalized): Secondary | ICD-10-CM | POA: Diagnosis not present

## 2018-10-25 DIAGNOSIS — M25611 Stiffness of right shoulder, not elsewhere classified: Secondary | ICD-10-CM | POA: Diagnosis not present

## 2018-10-28 DIAGNOSIS — M799 Soft tissue disorder, unspecified: Secondary | ICD-10-CM | POA: Diagnosis not present

## 2018-10-28 DIAGNOSIS — M25611 Stiffness of right shoulder, not elsewhere classified: Secondary | ICD-10-CM | POA: Diagnosis not present

## 2018-10-28 DIAGNOSIS — M6281 Muscle weakness (generalized): Secondary | ICD-10-CM | POA: Diagnosis not present

## 2018-11-01 DIAGNOSIS — M799 Soft tissue disorder, unspecified: Secondary | ICD-10-CM | POA: Diagnosis not present

## 2018-11-01 DIAGNOSIS — M6281 Muscle weakness (generalized): Secondary | ICD-10-CM | POA: Diagnosis not present

## 2018-11-01 DIAGNOSIS — M25611 Stiffness of right shoulder, not elsewhere classified: Secondary | ICD-10-CM | POA: Diagnosis not present

## 2018-11-11 DIAGNOSIS — M6281 Muscle weakness (generalized): Secondary | ICD-10-CM | POA: Diagnosis not present

## 2018-11-11 DIAGNOSIS — M799 Soft tissue disorder, unspecified: Secondary | ICD-10-CM | POA: Diagnosis not present

## 2018-11-11 DIAGNOSIS — M25611 Stiffness of right shoulder, not elsewhere classified: Secondary | ICD-10-CM | POA: Diagnosis not present

## 2018-11-25 DIAGNOSIS — Z6829 Body mass index (BMI) 29.0-29.9, adult: Secondary | ICD-10-CM | POA: Diagnosis not present

## 2018-11-25 DIAGNOSIS — I10 Essential (primary) hypertension: Secondary | ICD-10-CM | POA: Diagnosis not present

## 2018-11-25 DIAGNOSIS — S42301A Unspecified fracture of shaft of humerus, right arm, initial encounter for closed fracture: Secondary | ICD-10-CM | POA: Diagnosis not present

## 2018-11-25 DIAGNOSIS — Z299 Encounter for prophylactic measures, unspecified: Secondary | ICD-10-CM | POA: Diagnosis not present

## 2018-11-25 DIAGNOSIS — E785 Hyperlipidemia, unspecified: Secondary | ICD-10-CM | POA: Diagnosis not present

## 2018-11-25 DIAGNOSIS — K219 Gastro-esophageal reflux disease without esophagitis: Secondary | ICD-10-CM | POA: Diagnosis not present

## 2018-11-27 ENCOUNTER — Ambulatory Visit (INDEPENDENT_AMBULATORY_CARE_PROVIDER_SITE_OTHER): Payer: PPO | Admitting: Urology

## 2018-11-27 DIAGNOSIS — M5489 Other dorsalgia: Secondary | ICD-10-CM

## 2018-11-27 DIAGNOSIS — N202 Calculus of kidney with calculus of ureter: Secondary | ICD-10-CM

## 2018-12-24 ENCOUNTER — Other Ambulatory Visit (HOSPITAL_COMMUNITY): Payer: Self-pay | Admitting: Urology

## 2018-12-24 ENCOUNTER — Other Ambulatory Visit: Payer: Self-pay | Admitting: Urology

## 2018-12-24 DIAGNOSIS — N202 Calculus of kidney with calculus of ureter: Secondary | ICD-10-CM

## 2019-01-01 ENCOUNTER — Ambulatory Visit (HOSPITAL_COMMUNITY): Payer: PPO

## 2019-01-08 DIAGNOSIS — M961 Postlaminectomy syndrome, not elsewhere classified: Secondary | ICD-10-CM | POA: Diagnosis not present

## 2019-01-08 DIAGNOSIS — M4626 Osteomyelitis of vertebra, lumbar region: Secondary | ICD-10-CM | POA: Diagnosis not present

## 2019-01-08 DIAGNOSIS — R030 Elevated blood-pressure reading, without diagnosis of hypertension: Secondary | ICD-10-CM | POA: Diagnosis not present

## 2019-02-13 DIAGNOSIS — J309 Allergic rhinitis, unspecified: Secondary | ICD-10-CM | POA: Diagnosis not present

## 2019-02-13 DIAGNOSIS — R5383 Other fatigue: Secondary | ICD-10-CM | POA: Diagnosis not present

## 2019-02-13 DIAGNOSIS — E785 Hyperlipidemia, unspecified: Secondary | ICD-10-CM | POA: Diagnosis not present

## 2019-02-13 DIAGNOSIS — Z6827 Body mass index (BMI) 27.0-27.9, adult: Secondary | ICD-10-CM | POA: Diagnosis not present

## 2019-02-13 DIAGNOSIS — Z0000 Encounter for general adult medical examination without abnormal findings: Secondary | ICD-10-CM | POA: Diagnosis not present

## 2019-02-13 DIAGNOSIS — I10 Essential (primary) hypertension: Secondary | ICD-10-CM | POA: Diagnosis not present

## 2019-02-13 DIAGNOSIS — Z7189 Other specified counseling: Secondary | ICD-10-CM | POA: Diagnosis not present

## 2019-02-13 DIAGNOSIS — Z1211 Encounter for screening for malignant neoplasm of colon: Secondary | ICD-10-CM | POA: Diagnosis not present

## 2019-02-13 DIAGNOSIS — Z1331 Encounter for screening for depression: Secondary | ICD-10-CM | POA: Diagnosis not present

## 2019-02-13 DIAGNOSIS — Z299 Encounter for prophylactic measures, unspecified: Secondary | ICD-10-CM | POA: Diagnosis not present

## 2019-02-13 DIAGNOSIS — Z1339 Encounter for screening examination for other mental health and behavioral disorders: Secondary | ICD-10-CM | POA: Diagnosis not present

## 2019-02-14 ENCOUNTER — Encounter (HOSPITAL_COMMUNITY): Payer: Self-pay

## 2019-02-14 ENCOUNTER — Ambulatory Visit (HOSPITAL_COMMUNITY): Payer: PPO

## 2019-02-14 DIAGNOSIS — E785 Hyperlipidemia, unspecified: Secondary | ICD-10-CM | POA: Diagnosis not present

## 2019-02-14 DIAGNOSIS — R5383 Other fatigue: Secondary | ICD-10-CM | POA: Diagnosis not present

## 2019-02-14 DIAGNOSIS — Z79899 Other long term (current) drug therapy: Secondary | ICD-10-CM | POA: Diagnosis not present

## 2019-02-24 ENCOUNTER — Ambulatory Visit (HOSPITAL_COMMUNITY)
Admission: RE | Admit: 2019-02-24 | Discharge: 2019-02-24 | Disposition: A | Discharge status: Home / Self Care | Payer: PPO | Source: Ambulatory Visit | Attending: Urology | Admitting: Urology

## 2019-02-24 ENCOUNTER — Other Ambulatory Visit: Payer: Self-pay

## 2019-02-24 DIAGNOSIS — N202 Calculus of kidney with calculus of ureter: Secondary | ICD-10-CM | POA: Diagnosis not present

## 2019-02-24 DIAGNOSIS — N132 Hydronephrosis with renal and ureteral calculous obstruction: Secondary | ICD-10-CM | POA: Diagnosis not present

## 2019-02-26 ENCOUNTER — Other Ambulatory Visit: Payer: Self-pay

## 2019-02-26 ENCOUNTER — Ambulatory Visit (INDEPENDENT_AMBULATORY_CARE_PROVIDER_SITE_OTHER): Payer: PPO | Admitting: Urology

## 2019-02-26 ENCOUNTER — Ambulatory Visit (HOSPITAL_COMMUNITY)
Admission: RE | Admit: 2019-02-26 | Discharge: 2019-02-26 | Disposition: A | Discharge status: Home / Self Care | Payer: PPO | Source: Ambulatory Visit | Attending: Urology | Admitting: Urology

## 2019-02-26 VITALS — BP 126/73 | HR 60 | Temp 97.2°F

## 2019-02-26 DIAGNOSIS — N200 Calculus of kidney: Secondary | ICD-10-CM | POA: Insufficient documentation

## 2019-02-26 DIAGNOSIS — N135 Crossing vessel and stricture of ureter without hydronephrosis: Secondary | ICD-10-CM

## 2019-02-26 DIAGNOSIS — R109 Unspecified abdominal pain: Secondary | ICD-10-CM | POA: Diagnosis not present

## 2019-02-26 NOTE — Progress Notes (Signed)
02/26/2019 2:29 PM   Autumn Duran 1951-02-18 [id number]  Referring provider: [name], MD [place],  [place] [address]  Nephrolithiasis  HPI: Autumn Duran is a 68yo with a hx of nephrolithiasis here for followup. No stone events since last visit. Renal US from 12/28 shows bilateral hydronephrosis with an empty bladder. No worsening LUTS. No hematuria. No other associated symptoms. No exacerbating/alleviating events. She has mild bilateral flank pain. She feels fatigued. She is urinating. 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 Bronchitis, chronic ([place])   . Chronic back pain   . Chronic kidney disease    kidney stone s/p stent placement ( removed)  . Complication of anesthesia   . Compression fracture of L3 lumbar vertebra   . Diverticulosis   . GERD (gastroesophageal reflux disease)   . Hemorrhoids   . Hiatal hernia   . History of kidney stones   . Hx of adenomatous colonic polyps   . Hyperlipidemia   . Hypertension   . Lumbar radicular pain   . Osteoporosis 12/10/2017  . PONV (postoperative nausea and vomiting)   . Smoldering myeloma ([place])   . Sore throat   . T7 vertebral fracture [facility])     Surgical History: Past Surgical History:  Procedure Laterality Date  . BACK SURGERY    . BONE MARROW ASPIRATION Left 02/2014  . BONE MARROW BIOPSY Left 02/2014  . CHOLECYSTECTOMY    . COLONOSCOPY  12/18/2011   Procedure: COLONOSCOPY;  Surgeon: Lafayette Dragon, MD;  Location: [facility];  Service: Endoscopy;  Laterality: N/A;  . COLONOSCOPY WITH PROPOFOL N/A 08/03/2017   Procedure: COLONOSCOPY WITH PROPOFOL;  Surgeon: Rogene Houston, MD;  Location: [facility];  Service: Endoscopy;  Laterality: N/A;  . CYSTOSCOPY/RETROGRADE/URETEROSCOPY/STONE EXTRACTION WITH BASKET Bilateral 04/18/2018   Procedure: CYSTOSCOPY/BILATERAL RETROGRADE/URETEROSCOPY/STONE EXTRACTION WITH BASKET WITH  RIGHT STENT PLACEMENT;  Surgeon: Cleon Gustin, MD;  Location: [facility];  Service: Urology;  Laterality: Bilateral;  1 HR  . ESOPHAGEAL DILATION N/A 08/03/2017   Procedure: ESOPHAGEAL DILATION;  Surgeon: Rogene Houston, MD;  [facility]: [facility];  Service: Endoscopy;  Laterality: N/A;  . ESOPHAGOGASTRODUODENOSCOPY  12/18/2011   Procedure: ESOPHAGOGASTRODUODENOSCOPY (EGD);  Surgeon: Lafayette Dragon, MD;  Location: [facility];  Service: Endoscopy;  Laterality: N/A;  . ESOPHAGOGASTRODUODENOSCOPY (EGD) WITH PROPOFOL N/A 08/03/2017   Procedure: ESOPHAGOGASTRODUODENOSCOPY (EGD) WITH PROPOFOL;  Surgeon: Rogene Houston, MD;  [facility]: [facility];  Service: Endoscopy;  Laterality: N/A;  pt knows to arrive at 6:15  . HERNIA REPAIR    . HOLMIUM LASER APPLICATION Right 06/20/5359   Procedure: HOLMIUM LASER APPLICATION;  Surgeon: Cleon Gustin, MD;  Location: [facility];  Service: Urology;  Laterality: Right;  . LUMBAR LAMINECTOMY FOR EPIDURAL ABSCESS Left 09/22/2013   Procedure: LUMBAR LAMINECTOMY FOR EPIDURAL ABSCESS LEFT LUMBAR FIVE-SACRAL ONE;  Surgeon: Charlie Pitter, MD;  Location: [facility];  Service: Neurosurgery;  Laterality: Left;  . RADIOLOGY WITH ANESTHESIA N/A 08/13/2014   Procedure: RADIOLOGY WITH ANESTHESIA ;  Surgeon: Medication Radiologist, MD;  Location: [facility];  Service: Radiology;  Laterality: N/A;  . URETERAL STENT PLACEMENT    . VERTEBROPLASTY N/A 07/14/2013   Procedure: VERTEBROPLASTY WITH LUMBAR THREE BIOPSY;  Surgeon: Charlie Pitter, MD;  Location: [facility];  Service: Neurosurgery;  Laterality: N/A;  VERTEBROPLASTY WITH LUMBAR THREE BIOPSY  Home Medications:  Allergies as of 02/26/2019      Reactions   Chlorhexidine Gluconate Itching   Pt c/o itching after abd wipes.    Morphine And Related Nausea And Vomiting   Headache   Tetracycline Nausea Only      Medication List       Accurate as of February 26, 2019  2:29 PM. If you have any questions, ask your  nurse or doctor.        STOP taking these medications   benzonatate 200 MG capsule Commonly known as: TESSALON Stopped by: [name], MD   ketorolac 10 MG tablet Commonly known as: TORADOL Stopped by: [name], MD   ondansetron 4 MG disintegrating tablet Commonly known as: Zofran ODT Stopped by: [name], MD   ondansetron 4 MG tablet Commonly known as: ZOFRAN Stopped by: [name], MD   predniSONE 10 MG tablet Commonly known as: DELTASONE Stopped by: [name], MD   tamsulosin 0.4 MG Caps capsule Commonly known as: Flomax Stopped by: [name], MD     TAKE these medications   ibuprofen 200 MG tablet Commonly known as: ADVIL Take 2 tablets (400 mg total) by mouth daily as needed for mild pain.   omeprazole 20 MG capsule Commonly known as: PRILOSEC Take 20 mg by mouth 2 (two) times daily before a meal.   oxyCODONE-acetaminophen 5-325 MG tablet Commonly known as: Percocet Take 1 tablet by mouth every 4 (four) hours as needed for severe pain.   OxyCONTIN 20 mg 12 hr tablet Generic drug: oxyCODONE Take 20 mg by mouth daily.   THERATEARS OP Place 1 drop into both eyes daily as needed (dry eyes).       Allergies:  Allergies  Allergen Reactions  . Chlorhexidine Gluconate Itching    Pt c/o itching after abd wipes.   . Morphine And Related Nausea And Vomiting    Headache  . Tetracycline Nausea Only    Family History: Family History  Problem Relation Age of Onset  . Breast cancer Sister 63  . Breast cancer Other 11       aunt    Social History:  reports that she has never smoked. She has never used smokeless tobacco. She reports current alcohol use. She reports that she does not use drugs.  ROS:   Urological Symptom Review  Patient is experiencing the following symptoms: Injury to kidneys/bladder   Review of Systems  Gastrointestinal (upper)  : Negative for upper GI symptoms  Gastrointestinal (lower)  : Negative for lower GI symptoms  Constitutional : Negative for symptoms  Skin: Negative for skin symptoms  Eyes: Negative for eye symptoms  Ear/Nose/Throat : Negative for Ear/Nose/Throat symptoms  Hematologic/Lymphatic: Negative for Hematologic/Lymphatic symptoms  Cardiovascular : Negative for cardiovascular symptoms  Respiratory : Negative for respiratory symptoms  Endocrine: Negative for endocrine symptoms  Musculoskeletal: Negative for musculoskeletal symptoms  Neurological: Negative for neurological symptoms  Psychologic: Negative for psychiatric symptoms                                       Physical Exam: BP 126/73   Pulse 60   Temp (!) 97.2 F (36.2 C)   Constitutional:  Alert and oriented, No acute distress. HEENT: [place] AT, moist mucus membranes.  Trachea midline, no masses. Cardiovascular: No clubbing, cyanosis, or edema. Respiratory: Normal respiratory effort, no increased work of breathing. GI: Abdomen is  soft, nontender, nondistended, no abdominal masses GU: No CVA tenderness Lymph: No cervical or inguinal lymphadenopathy. Skin: No rashes, bruises or suspicious lesions. Neurologic: Grossly intact, no focal deficits, moving all 4 extremities. Psychiatric: Normal mood and affect.  Laboratory Data: Lab Results  Component Value Date   WBC 9.4 04/18/2018   HGB 14.0 04/18/2018   HCT 45.3 04/18/2018   MCV 88.5 04/18/2018   PLT 328 04/18/2018    Lab Results  Component Value Date   CREATININE 0.97 04/18/2018    No results found for: PSA  No results found for: TESTOSTERONE  No results found for: HGBA1C  Urinalysis    Component Value Date/Time   COLORURINE YELLOW 08/13/2014 0850   APPEARANCEUR CLOUDY (A) 08/13/2014 0850   LABSPEC 1.017 08/13/2014 0850   PHURINE 6.5 08/13/2014 0850   GLUCOSEU NEGATIVE 08/13/2014 0850   HGBUR NEGATIVE 08/13/2014 0850   BILIRUBINUR NEGATIVE 08/13/2014 0850   KETONESUR >80 (A)  08/13/2014 0850   PROTEINUR NEGATIVE 08/13/2014 0850   UROBILINOGEN 0.2 08/13/2014 0850   NITRITE NEGATIVE 08/13/2014 0850   LEUKOCYTESUR SMALL (A) 08/13/2014 0850    Lab Results  Component Value Date   BACTERIA FEW (A) 08/13/2014    Pertinent Imaging: CT stone study: progressive retroperitoneal fibrosis involving both proximal ureters No results found for this or any previous visit. Results for orders placed during the hospital encounter of 12/18/13  US Venous Img Lower Bilateral   Narrative CLINICAL DATA:  Leg swelling  EXAM: BILATERAL LOWER EXTREMITY VENOUS DOPPLER ULTRASOUND  TECHNIQUE: Gray-scale sonography with graded compression, as well as color Doppler and duplex ultrasound were performed to evaluate the lower extremity deep venous systems from the level of the common femoral vein and including the common femoral, femoral, profunda femoral, popliteal and calf veins including the posterior tibial, peroneal and gastrocnemius veins when visible. The superficial great saphenous vein was also interrogated. Spectral Doppler was utilized to evaluate flow at rest and with distal augmentation maneuvers in the common femoral, femoral and popliteal veins.  COMPARISON:  None.  FINDINGS: RIGHT LOWER EXTREMITY  Common Femoral Vein: No evidence of thrombus. Normal compressibility, respiratory phasicity and response to augmentation.  Saphenofemoral Junction: No evidence of thrombus. Normal compressibility and flow on color Doppler imaging.  Profunda Femoral Vein: No evidence of thrombus. Normal compressibility and flow on color Doppler imaging.  Femoral Vein: No evidence of thrombus. Normal compressibility, respiratory phasicity and response to augmentation.  Popliteal Vein: No evidence of thrombus. Normal compressibility, respiratory phasicity and response to augmentation.  Calf Veins: No evidence of thrombus. Normal compressibility and flow on color Doppler  imaging.  Superficial Great Saphenous Vein: No evidence of thrombus. Normal compressibility and flow on color Doppler imaging.  Venous Reflux:  None.  Other Findings:  None.  LEFT LOWER EXTREMITY  Common Femoral Vein: No evidence of thrombus. Normal compressibility, respiratory phasicity and response to augmentation.  Saphenofemoral Junction: No evidence of thrombus. Normal compressibility and flow on color Doppler imaging.  Profunda Femoral Vein: No evidence of thrombus. Normal compressibility and flow on color Doppler imaging.  Femoral Vein: No evidence of thrombus. Normal compressibility, respiratory phasicity and response to augmentation.  Popliteal Vein: No evidence of thrombus. Normal compressibility, respiratory phasicity and response to augmentation.  Calf Veins: Not well visualized.  Superficial Great Saphenous Vein: No evidence of thrombus. Normal compressibility and flow on color Doppler imaging.  Venous Reflux:  None.  Other Findings:  None.  IMPRESSION: No evidence of deep venous thrombosis.   Electronically  Signed   By: Kathreen Devoid   On: 12/18/2013 13:37    No results found for this or any previous visit. No results found for this or any previous visit. Results for orders placed during the hospital encounter of 02/24/19  US RENAL   Narrative CLINICAL DATA:  Renal calculus  EXAM: RENAL / URINARY TRACT ULTRASOUND COMPLETE  COMPARISON:  CT dated August 10, 2018.  May 17, 2018  FINDINGS: Right Kidney:  Renal measurements: 11.7 x 5.9 x 6.1 cm = volume: 218 mL. There is mild-to-moderate right-sided hydronephrosis. There is a 0.8 cm stone in the interpolar region. There is a 1.7 cm stone in the upper pole.  Left Kidney:  Renal measurements: 12 x 6.3 x 6.8 cm = volume: 265 mL. There is moderate hydronephrosis. There is a 0.9 cm stone in the upper pole.  Bladder:  The bladder was underdistended and therefore poorly  evaluated.  Other:  None.  IMPRESSION: 1. Mild-to-moderate bilateral hydronephrosis. This appears to have worsened bilaterally since the patient's prior ultrasound dated May 02, 2018. 2. Bilateral renal nephroliths as detailed above. 3. The urinary bladder is poorly evaluated secondary to underdistention.   Electronically Signed   By: Constance Holster M.D.   On: 02/24/2019 16:15    No results found for this or any previous visit. No results found for this or any previous visit. Results for orders placed during the hospital encounter of 04/17/18  CT RENAL STONE STUDY   Narrative CLINICAL DATA:  68 year old female with history of bilateral groin pain. Kidney stones. Hematuria.  EXAM: CT ABDOMEN AND PELVIS WITHOUT CONTRAST  TECHNIQUE: Multidetector CT imaging of the abdomen and pelvis was performed following the standard protocol without IV contrast.  COMPARISON:  CT the abdomen and pelvis 04/02/2018.  FINDINGS: Lower chest: Mild cardiomegaly. Scattered areas of cylindrical bronchiectasis in the lung bases bilaterally (left greater than right) with extensive thickening of the peribronchovascular interstitium and some peribronchovascular ground-glass attenuation in the left lower lobe, similar to the prior study.  Hepatobiliary: 2.1 cm low-attenuation lesion in segment 2 of the liver is incompletely characterized on today's noncontrast CT examination, but similar to the prior study and statistically likely to represent a cyst. Small calcified granulomas in the liver. Status post cholecystectomy.  Pancreas: No definite pancreatic mass or peripancreatic fluid or inflammatory changes are noted on today's noncontrast CT examination.  Spleen: Unremarkable.  Adrenals/Urinary Tract: Multiple nonobstructive calculi are noted within the collecting systems of both kidneys measuring up to 3 mm in the upper pole of the right kidney. In addition, at the right ureteropelvic  junction there is a 6 mm calculus (axial image 39 of series 3). This is associated with moderate right hydronephrosis. In addition, there is a 5 mm calculus in the distal third of the left ureter (axial image 68 of series 3). This is not associated with significant proximal left hydroureteronephrosis at this time. No calculi are noted within the lumen of the urinary bladder. Several small parapelvic cysts are noted in the left kidney. Unenhanced appearance of the kidneys is otherwise unremarkable. Urinary bladder is unremarkable in appearance. Bilateral adrenal glands are normal in appearance.  Stomach/Bowel: Unenhanced appearance of the stomach is normal. No pathologic dilatation of small bowel or colon. Normal appendix.  Vascular/Lymphatic: Aortic atherosclerosis. No lymphadenopathy noted in the abdomen or pelvis.  Reproductive: Uterus and ovaries are unremarkable in appearance. Bilateral tubal ligation clips are incidentally noted.  Other: No significant volume of ascites.  No pneumoperitoneum.  Musculoskeletal: Chronic vertebral body compression fracture at L3 with post vertebroplasty changes in the right side of the vertebral body, similar to the prior study. There are no aggressive appearing lytic or blastic lesions noted in the visualized portions of the skeleton.  IMPRESSION: 1. 6 mm calculus at the right ureteropelvic junction with moderate proximal right hydronephrosis. 2. 5 mm calculus in the distal third of the left ureter. This is not associated with proximal left hydroureteronephrosis at this time. 3. Multiple additional nonobstructive calculi in the collecting systems of both kidneys measuring up to 3 mm in the upper pole collecting system of the right kidney. 4. Aortic atherosclerosis. 5. Mild cardiomegaly. 6. Areas of bronchiectasis and post infectious scarring in the lower lungs, as above, similar to prior studies. 7. Additional incidental findings, as  above.   Electronically Signed   By: Vinnie Langton M.D.   On: 04/17/2018 12:50     Assessment & Plan:    1. Nephrolithiasis: -CT stone study, will call with results  2. Retroperitoneal fibrosis:  -BMP -Referral to medical oncology -RTC 1-2 weeks. Patient may need bilateral ureteral stent placement   No follow-ups on file.  [name], MD  [facility] [place] [facility]

## 2019-02-27 ENCOUNTER — Other Ambulatory Visit: Payer: Self-pay | Admitting: Urology

## 2019-02-27 ENCOUNTER — Encounter: Payer: Self-pay | Admitting: Urology

## 2019-02-27 DIAGNOSIS — N200 Calculus of kidney: Secondary | ICD-10-CM | POA: Diagnosis not present

## 2019-02-27 NOTE — Patient Instructions (Signed)
Hydronephrosis  Hydronephrosis is the swelling of one or both kidneys due to a blockage that stops urine from flowing out of the body. Kidneys filter waste from the blood and produce urine. This condition can lead to kidney failure and may become life threatening if not treated promptly. What are the causes? Common causes of this condition include:  Problems that occur when a baby is developing in the womb (congenital defect). These can include problems: ? In the kidneys. ? In the tubes that drain urine from the kidneys into the bladder (ureters).  Kidney stones.  Bladder infection.  An enlarged prostate gland.  Scar tissue from a previous surgery or injury.  A blood clot.  A tumor or cyst in the abdomen or pelvis.  Cancer of the prostate, bladder, uterus, ovary, or colon. What are the signs or symptoms? Symptoms of this condition include:  Pain or discomfort in your side (flank).  Pain and swelling in your abdomen.  Nausea and vomiting.  Fever.  Pain when passing urine.  Feelings of urgency when you need to urinate.  Urinating more often than normal. In some cases, you may not have any symptoms. How is this diagnosed? This condition may be diagnosed based on:  Your symptoms and medical history.  A physical exam.  Blood and urine tests.  Imaging tests, such as an ultrasound, CT scan, or MRI.  A procedure in which a scope is inserted into the urethra and used to view parts of the urinary tract and bladder (cystoscopy). How is this treated? Treatment for this condition depends on where the blockage is, how long it has been there, and what caused it. The goal of treatment is to remove the blockage. Treatment may include:  Antibiotic medicines to treat or prevent infection.  A procedure to place a small, thin tube (stent) into a blocked ureter. The stent will keep the ureter open so that urine can drain through it.  A nonsurgical procedure that crushes kidney  stones with shock waves (extracorporeal shock wave lithotripsy).  If kidney failure occurs, treatment may include dialysis or a kidney transplant. Follow these instructions at home:   Take over-the-counter and prescription medicines only as told by your health care provider.  Rest and return to your normal activities as told by your health care provider. Ask your health care provider what activities are safe for you.  Drink enough fluid to keep your urine pale yellow.  If you were prescribed an antibiotic medicine, take it exactly as told by your health care provider. Do not stop taking the antibiotic even if you start to feel better.  Keep all follow-up visits as told by your health care provider. This is important. Contact a health care provider if:  You continue to have symptoms after treatment.  You develop new symptoms.  Your urine becomes cloudy or bloody.  You have a fever. Get help right away if:  You have severe flank or abdominal pain.  You cannot drink fluids without vomiting. Summary  Hydronephrosis is the swelling of one or both kidneys due to a blockage that stops urine from flowing out of the body.  Hydronephrosis can lead to kidney failure and may become life threatening if not treated promptly.  The goal of treatment is to treat the cause of the blockage. It may include insertion of stent into a blocked ureter, a procedure to treat kidney stones, and antibiotic medicines.  Follow your health care provider's instructions for taking care of yourself at   home, including instructions about drinking fluids, taking medicines, and limiting activities. This information is not intended to replace advice given to you by your health care provider. Make sure you discuss any questions you have with your health care provider. Document Revised: 02/24/2017 Document Reviewed: 02/24/2017 Elsevier Patient Education  2020 [employer] Inc[employer]  

## 2019-02-28 LAB — BASIC METABOLIC PANEL
BUN: 11 mg/dL (ref 7–25)
CO2: 26 mmol/L (ref 20–32)
Calcium: 9.7 mg/dL (ref 8.6–10.4)
Chloride: 105 mmol/L (ref 98–110)
Creat: 0.71 mg/dL (ref 0.50–0.99)
Glucose, Bld: 91 mg/dL (ref 65–139)
Potassium: 3.6 mmol/L (ref 3.5–5.3)
Sodium: 139 mmol/L (ref 135–146)

## 2019-03-03 ENCOUNTER — Other Ambulatory Visit: Payer: Self-pay

## 2019-03-03 ENCOUNTER — Other Ambulatory Visit (HOSPITAL_COMMUNITY): Payer: Self-pay | Admitting: Nurse Practitioner

## 2019-03-03 ENCOUNTER — Ambulatory Visit (HOSPITAL_COMMUNITY): Payer: PPO | Admitting: Nurse Practitioner

## 2019-03-03 ENCOUNTER — Inpatient Hospital Stay (HOSPITAL_COMMUNITY): Payer: PPO | Attending: Hematology

## 2019-03-03 DIAGNOSIS — E785 Hyperlipidemia, unspecified: Secondary | ICD-10-CM | POA: Diagnosis not present

## 2019-03-03 DIAGNOSIS — C9030 Solitary plasmacytoma not having achieved remission: Secondary | ICD-10-CM | POA: Insufficient documentation

## 2019-03-03 DIAGNOSIS — Z79899 Other long term (current) drug therapy: Secondary | ICD-10-CM | POA: Insufficient documentation

## 2019-03-03 DIAGNOSIS — D509 Iron deficiency anemia, unspecified: Secondary | ICD-10-CM | POA: Insufficient documentation

## 2019-03-03 DIAGNOSIS — N189 Chronic kidney disease, unspecified: Secondary | ICD-10-CM | POA: Diagnosis not present

## 2019-03-03 DIAGNOSIS — H43811 Vitreous degeneration, right eye: Secondary | ICD-10-CM | POA: Diagnosis not present

## 2019-03-03 DIAGNOSIS — Z791 Long term (current) use of non-steroidal anti-inflammatories (NSAID): Secondary | ICD-10-CM | POA: Diagnosis not present

## 2019-03-03 DIAGNOSIS — Z803 Family history of malignant neoplasm of breast: Secondary | ICD-10-CM | POA: Insufficient documentation

## 2019-03-03 DIAGNOSIS — I10 Essential (primary) hypertension: Secondary | ICD-10-CM | POA: Diagnosis not present

## 2019-03-03 DIAGNOSIS — H04123 Dry eye syndrome of bilateral lacrimal glands: Secondary | ICD-10-CM | POA: Diagnosis not present

## 2019-03-03 DIAGNOSIS — H2513 Age-related nuclear cataract, bilateral: Secondary | ICD-10-CM | POA: Diagnosis not present

## 2019-03-03 DIAGNOSIS — K449 Diaphragmatic hernia without obstruction or gangrene: Secondary | ICD-10-CM | POA: Insufficient documentation

## 2019-03-03 LAB — COMPREHENSIVE METABOLIC PANEL
ALT: 11 U/L (ref 0–44)
AST: 15 U/L (ref 15–41)
Albumin: 3.4 g/dL — ABNORMAL LOW (ref 3.5–5.0)
Alkaline Phosphatase: 74 U/L (ref 38–126)
Anion gap: 9 (ref 5–15)
BUN: 12 mg/dL (ref 8–23)
CO2: 26 mmol/L (ref 22–32)
Calcium: 10.1 mg/dL (ref 8.9–10.3)
Chloride: 104 mmol/L (ref 98–111)
Creatinine, Ser: 0.72 mg/dL (ref 0.44–1.00)
GFR calc Af Amer: >60 mL/min (ref >60)
GFR calc non Af Amer: >60 mL/min (ref >60)
Glucose, Bld: 99 mg/dL (ref 70–99)
Potassium: 3.9 mmol/L (ref 3.5–5.1)
Sodium: 139 mmol/L (ref 135–145)
Total Bilirubin: 0.4 mg/dL (ref 0.3–1.2)
Total Protein: 8 g/dL (ref 6.5–8.1)

## 2019-03-03 LAB — CBC WITH DIFFERENTIAL/PLATELET
Abs Immature Granulocytes: 0.01 10*3/uL (ref 0.00–0.07)
Basophils Absolute: 0.1 10*3/uL (ref 0.0–0.1)
Basophils Relative: 1 %
Eosinophils Absolute: 0.2 10*3/uL (ref 0.0–0.5)
Eosinophils Relative: 3 %
HCT: 36.1 % (ref 36.0–46.0)
Hemoglobin: 10.8 g/dL — ABNORMAL LOW (ref 12.0–15.0)
Immature Granulocytes: 0 %
Lymphocytes Relative: 22 %
Lymphs Abs: 1.5 10*3/uL (ref 0.7–4.0)
MCH: 24.4 pg — ABNORMAL LOW (ref 26.0–34.0)
MCHC: 29.9 g/dL — ABNORMAL LOW (ref 30.0–36.0)
MCV: 81.7 fL (ref 80.0–100.0)
Monocytes Absolute: 0.4 10*3/uL (ref 0.1–1.0)
Monocytes Relative: 6 %
Neutro Abs: 4.6 10*3/uL (ref 1.7–7.7)
Neutrophils Relative %: 68 %
Platelets: 435 10*3/uL — ABNORMAL HIGH (ref 150–400)
RBC: 4.42 MIL/uL (ref 3.87–5.11)
RDW: 16.3 % — ABNORMAL HIGH (ref 11.5–15.5)
WBC: 6.7 10*3/uL (ref 4.0–10.5)
nRBC: 0 % (ref 0.0–0.2)

## 2019-03-03 LAB — VITAMIN B12: Vitamin B-12: 453 pg/mL (ref 180–914)

## 2019-03-03 LAB — IRON AND TIBC
Iron: 27 ug/dL — ABNORMAL LOW (ref 28–170)
Saturation Ratios: 10 % — ABNORMAL LOW (ref 10.4–31.8)
TIBC: 275 ug/dL (ref 250–450)
UIBC: 248 ug/dL

## 2019-03-03 LAB — FERRITIN: Ferritin: 82 ng/mL (ref 11–307)

## 2019-03-03 LAB — VITAMIN D 25 HYDROXY (VIT D DEFICIENCY, FRACTURES): Vit D, 25-Hydroxy: 23.15 ng/mL — ABNORMAL LOW (ref 30–100)

## 2019-03-03 LAB — LACTATE DEHYDROGENASE: LDH: 97 U/L — ABNORMAL LOW (ref 98–192)

## 2019-03-03 LAB — FOLATE: Folate: 6.8 ng/mL (ref >5.9)

## 2019-03-03 NOTE — Assessment & Plan Note (Deleted)
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 -She received radiation to the lumbar spine. -Bone marrow biopsy showed no evidence of plasma cell neoplasm.  Bone marrow biopsy has normal cytogenetics. -Kappa lambda light chain ratio has been normal, SPEP/IEP has been normal. -Labs done on -She remains on observation and is 5 years out from her diagnosis. -She will follow-up in 1 year with repeat labs.  2.  Osteoporosis: -She received 1 dose of Reclast in 2016 and did not wish to continue treatment. -DEXA scan done on 06/21/2017 showed a T score of -3.8 which is osteoporosis. -She has the option of Prolia 60 mg every 6 months. -Patient is taking calcium and vitamin D daily. -We will repeat her DEXA scan in May 2021.  3.  Iron deficiency anemia: -Colonoscopy on 07/2017 that showed hemorrhoids and polyps.  Pathology returned as tubular adenoma and no malignancy. -She was last given IV iron January 2019. -Labs on -  4.  Hiatal hernia: -Patient was having problems with nausea and vomiting and was referred to Dr. Laural Golden of GI. -EGD done on 07/2017 showed a large hiatal hernia. -Patient was seen by surgery at [facility] for evaluation.

## 2019-03-04 LAB — PROTEIN ELECTROPHORESIS, SERUM
A/G Ratio: 0.8 (ref 0.7–1.7)
Albumin ELP: 3.1 g/dL (ref 2.9–4.4)
Alpha-1-Globulin: 0.4 g/dL (ref 0.0–0.4)
Alpha-2-Globulin: 0.9 g/dL (ref 0.4–1.0)
Beta Globulin: 1 g/dL (ref 0.7–1.3)
Gamma Globulin: 1.6 g/dL (ref 0.4–1.8)
Globulin, Total: 3.9 g/dL (ref 2.2–3.9)
Total Protein ELP: 7 g/dL (ref 6.0–8.5)

## 2019-03-04 LAB — IGG, IGA, IGM
IgA: 346 mg/dL (ref 87–352)
IgG (Immunoglobin G), Serum: 1817 mg/dL — ABNORMAL HIGH (ref 586–1602)
IgM (Immunoglobulin M), Srm: 99 mg/dL (ref 26–217)

## 2019-03-04 LAB — IMMUNOFIXATION ELECTROPHORESIS
IgA: 333 mg/dL (ref 87–352)
IgG (Immunoglobin G), Serum: 1772 mg/dL — ABNORMAL HIGH (ref 586–1602)
IgM (Immunoglobulin M), Srm: 98 mg/dL (ref 26–217)
Total Protein ELP: 7 g/dL (ref 6.0–8.5)

## 2019-03-04 LAB — KAPPA/LAMBDA LIGHT CHAINS
Kappa free light chain: 43.7 mg/L — ABNORMAL HIGH (ref 3.3–19.4)
Kappa, lambda light chain ratio: 0.88 (ref 0.26–1.65)
Lambda free light chains: 49.4 mg/L — ABNORMAL HIGH (ref 5.7–26.3)

## 2019-03-08 IMAGING — RF DG UGI W/ HIGH DENSITY W/KUB
12 of 18 series · 14 of 24 positions shown · non-contrast
Comparison: None

CLINICAL DATA: Dysphagia, hiatal hernia, incomplete upper endoscopy

EXAM:
UPPER GI SERIES WITH KUB
TECHNIQUE: After obtaining a scout radiograph a routine upper GI series was
performed using thin and high density barium.
FLUOROSCOPY TIME:  Fluoroscopy Time:  2 minutes 24 seconds
Radiation Exposure Index (if provided by the fluoroscopic device):
69.7 mGy
Number of Acquired Spot Images: 3 plus multiple fluoroscopic screen
captures

[Series 1: t abdomen supine · 0.15mm/px · 1 of 1 slices shown]
[im 1/1]
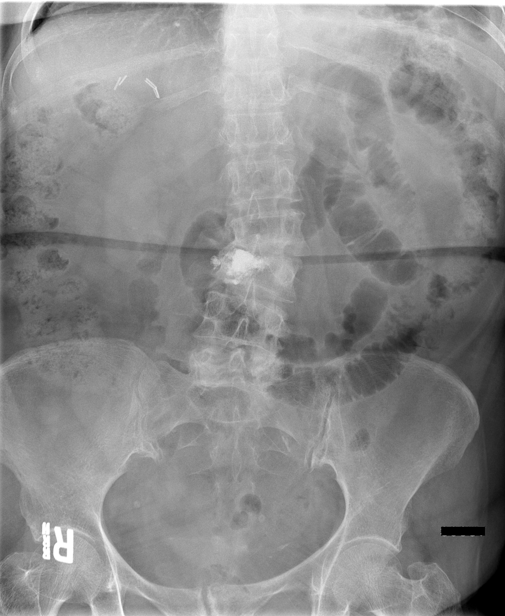

[Series 4: cp_standard · 0.19mm/px · 1 of 1 slices shown (1 of 11)]
[im 1/1]
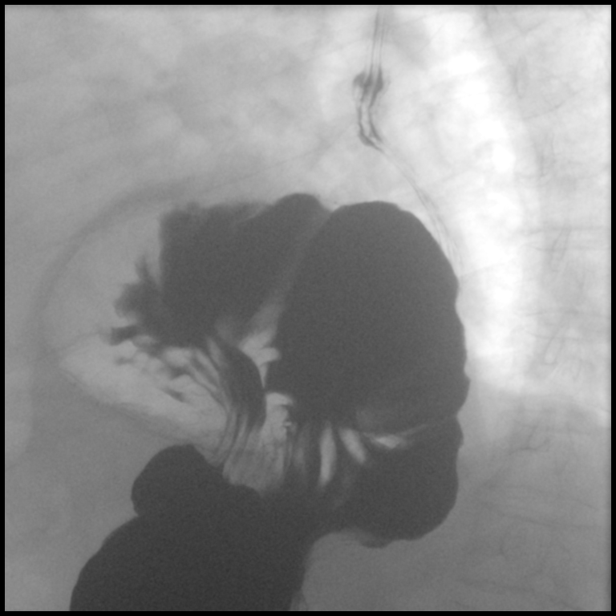

[Series 5: cp_standard · 0.19mm/px · 1 of 7 frames shown (2 of 11)]
[frame 6/7]
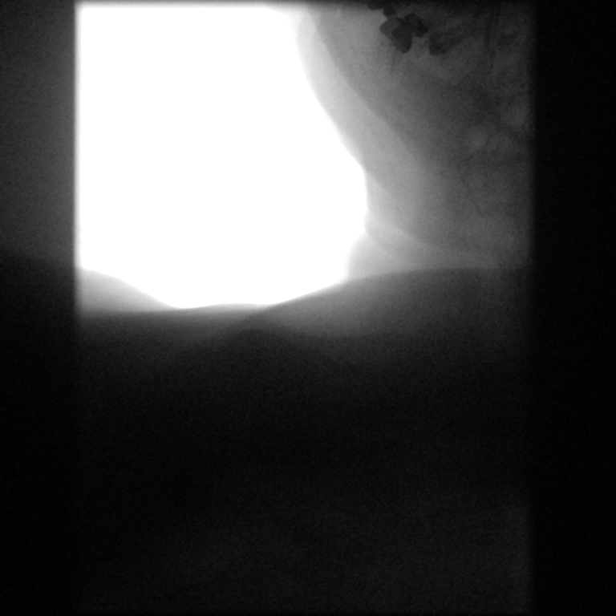

[Series 6: cp_standard · 0.19mm/px · 2 of 65 frames shown (3 of 11)]
[frame 10/65]
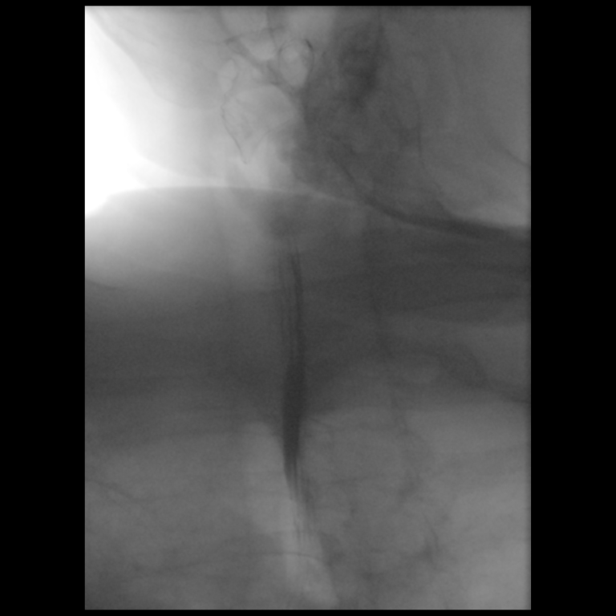
[frame 56/65]
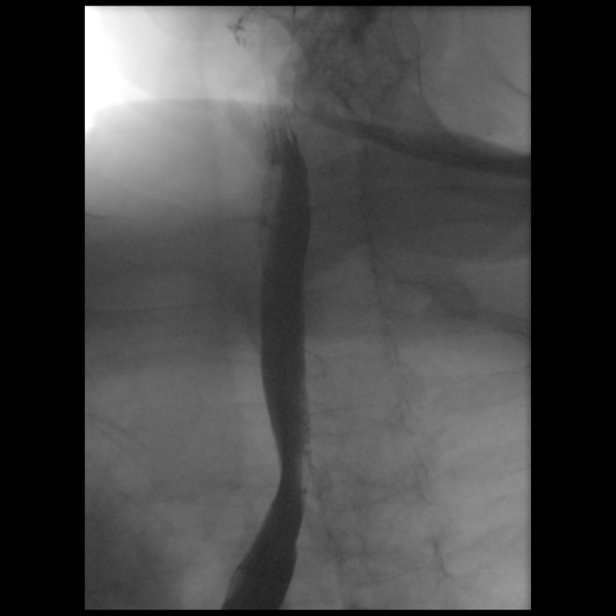

[Series 7: cp_standard · 0.19mm/px · 1 of 37 frames shown (4 of 11)]
[frame 32/37]
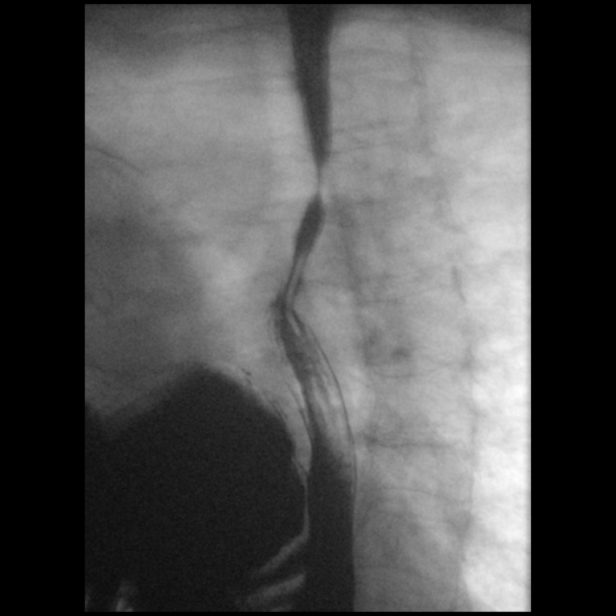

[Series 8: cp_standard · 0.19mm/px · 2 of 63 frames shown (5 of 11)]
[frame 21/63]
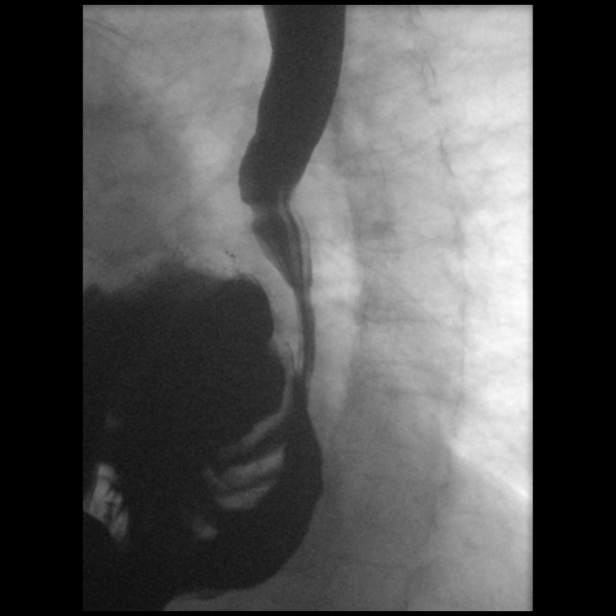
[frame 32/63]
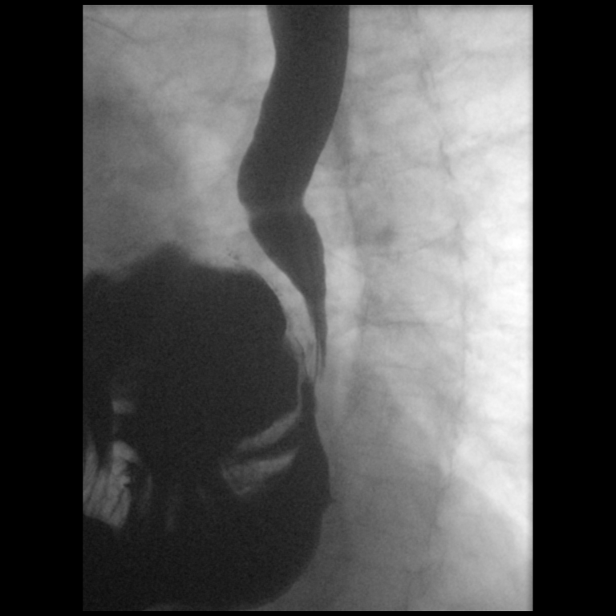

[Series 10: cp_standard · 0.19mm/px · 1 of 1 slices shown (6 of 11)]
[im 1/1]
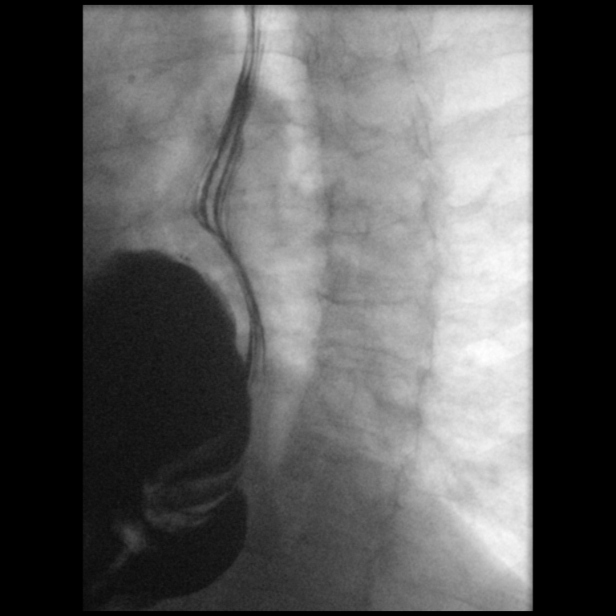

[Series 13: cp_standard · 0.19mm/px · 1 of 1 slices shown (7 of 11)]
[im 1/1]
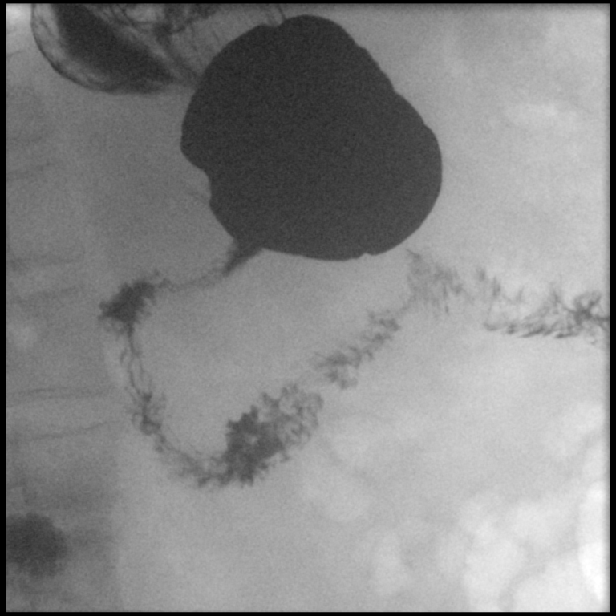

[Series 16: cp_standard · 0.20mm/px · 1 of 1 slices shown (8 of 11)]
[im 1/1]
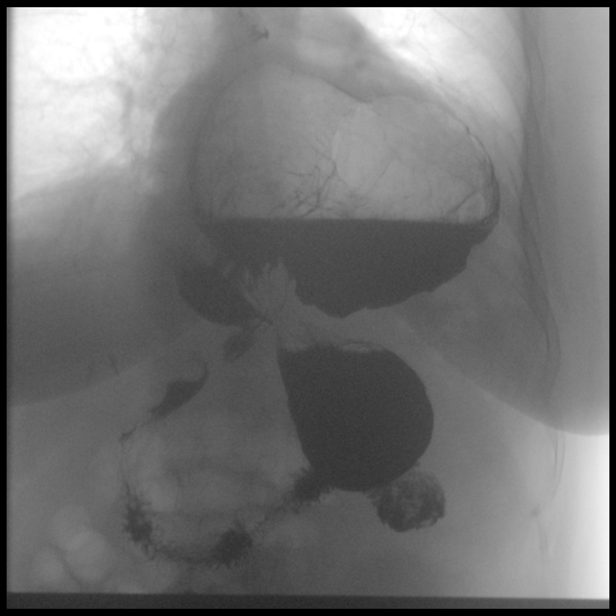

[Series 18: cp_standard · 0.19mm/px · 1 of 1 slices shown (9 of 11)]
[im 1/1]
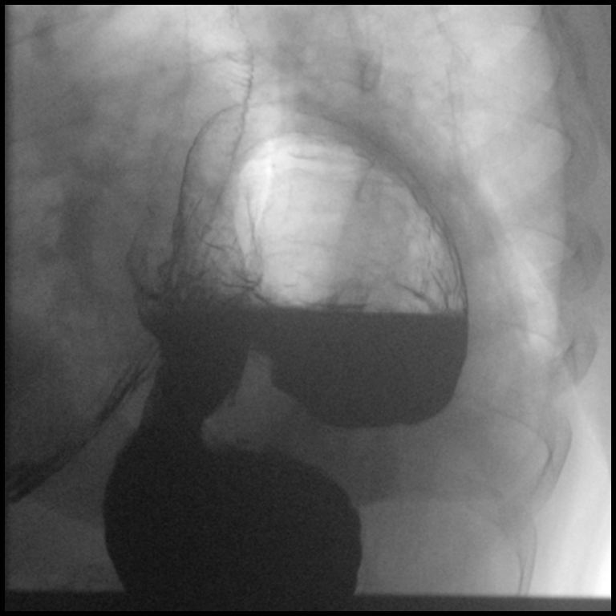

[Series 21: cp_standard · 0.20mm/px · 1 of 1 slices shown (10 of 11)]
[im 1/1]
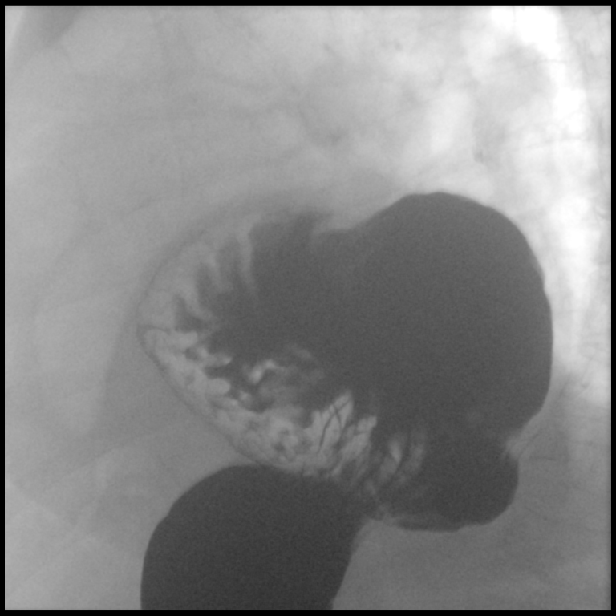

[Series 24: cp_standard · 0.20mm/px · 1 of 1 slices shown (11 of 11)]
[im 1/1]
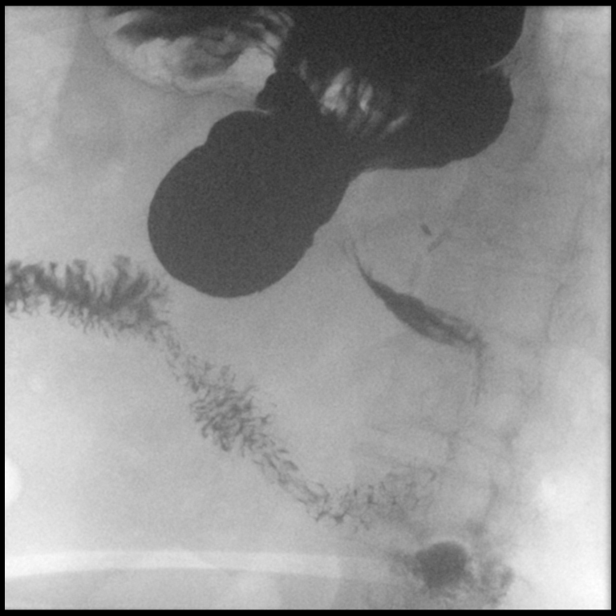

[14 of 24 positions shown; findings below may reference images not displayed]

FINDINGS: Nonobstructive bowel gas pattern on scout image.

Air-filled loops of normal caliber small bowel in mid abdomen.

Scattered stool throughout colon.

Surgical clips RIGHT upper quadrant likely from cholecystectomy.

Normal esophageal distention without mass or stricture.

Age-related esophageal dysmotility.

No gastroesophageal reflux seen during exam.

Very large hiatal hernia with estimated [DATE] of the stomach in the
inferior thorax.

Distal stomach extends below diaphragm.

Normal rugal fold pattern without mass or ulceration.

Question mildly slow emptying of contrast from the stomach despite
changes in patient position.

Duodenal bulb and sweep normal appearance.

Visualized proximal jejunal loops unremarkable.
IMPRESSION: Very large hiatal hernia with estimated [DATE] of the stomach in the
inferior hemithorax.

Question slightly delayed emptying of contrast from the stomach
though no definite gastric outlet obstruction is identified.

## 2019-03-10 ENCOUNTER — Other Ambulatory Visit: Payer: Self-pay

## 2019-03-10 ENCOUNTER — Inpatient Hospital Stay (HOSPITAL_BASED_OUTPATIENT_CLINIC_OR_DEPARTMENT_OTHER): Payer: PPO | Admitting: Nurse Practitioner

## 2019-03-10 DIAGNOSIS — C9030 Solitary plasmacytoma not having achieved remission: Secondary | ICD-10-CM

## 2019-03-10 NOTE — Assessment & Plan Note (Addendum)
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 -She received radiation to the lumbar spine. -Bone marrow biopsy showed no evidence of plasma cell neoplasm.  Bone marrow biopsy has normal cytogenetics. -Kappa lambda light chain ratio has been normal, SPEP/IEP has been normal. -Labs done on 03/03/2019 showed SPEP normal, light chain ratio normal. -She had a CT renal study done on 02/26/2019 which showed inflammation around her spine and kidneys.  This is due from radiation. -She remains on observation and is 5 years out from her diagnosis. -She will follow-up in 3 months with repeat labs to check her creatinine due to inflammation around her kidneys and spine.  2.  Osteoporosis: -She received 1 dose of Reclast in 2016 and did not wish to continue treatment. -DEXA scan done on 06/21/2017 showed a T score of -3.8 which is osteoporosis. -She has the option of Prolia 60 mg every 6 months. -Patient is taking calcium and vitamin D daily. -We will repeat her DEXA scan in May 2021.  3.  Iron deficiency anemia: -Colonoscopy on 07/2017 that showed hemorrhoids and polyps.  Pathology returned as tubular adenoma and no malignancy. -She was last given IV iron January 2019. -Labs on 03/03/2019 showed hemoglobin 10.8, ferritin 82, percent saturation 10 -I have recommended 2 infusions of IV iron 1 week apart. -Iron infusions have been holding her for about 12 months. -We will recheck her labs on her next visit.  4.  Hiatal hernia: -Patient was having problems with nausea and vomiting and was referred to Dr. Laural Golden of GI. -EGD done on 07/2017 showed a large hiatal hernia. -Patient was seen by surgery at [facility] for evaluation.

## 2019-03-10 NOTE — Progress Notes (Signed)
Autumn Duran, [place] [address]   CLINIC:  Medical Oncology/Hematology  PCP:  [name], MD [place] [id number] [phone]   REASON FOR VISIT: Follow-up for plasmacytoma and iron deficiency anemia  CURRENT THERAPY: Observation and intermittent iron infusions   INTERVAL HISTORY:  Autumn Duran 69 y.o. female is called for a follow-up for plasmacytoma and iron deficiency anemia.  Patient reports she has been having lower back pain.  She also is more fatigued than normal.  She denies any bright red bleeding per rectum or melena.  She denies any easy bruising or bleeding. Denies any nausea, vomiting, or diarrhea. Denies any new pains. Had not noticed any recent bleeding such as epistaxis, hematuria or hematochezia. Denies recent chest pain on exertion, shortness of breath on minimal exertion, pre-syncopal episodes, or palpitations. Denies any numbness or tingling in hands or feet. Denies any recent fevers, infections, or recent hospitalizations. Patient reports appetite at 75% and energy level at 25%.  She is eating well maintaining her weight at this time.     REVIEW OF SYSTEMS:  Review of Systems  Constitutional: Positive for fatigue.  Musculoskeletal: Positive for back pain.  All other systems reviewed and are negative.    PAST MEDICAL/SURGICAL HISTORY:  Past Medical History:  Diagnosis Date  . Bronchitis, chronic ([place])   . Chronic back pain   . Chronic kidney disease    kidney Duran s/p stent placement ( removed)  . Complication of anesthesia   . Compression fracture of L3 lumbar vertebra   . Diverticulosis   . GERD (gastroesophageal reflux disease)   . Hemorrhoids   . Hiatal hernia   . History of kidney stones   . Hx of adenomatous colonic polyps   . Hyperlipidemia   . Hypertension   . Lumbar radicular pain   . Osteoporosis 12/10/2017  . PONV (postoperative nausea and vomiting)   . Smoldering myeloma ([place])   . Sore  throat   . T7 vertebral fracture [facility])    Past Surgical History:  Procedure Laterality Date  . BACK SURGERY    . BONE MARROW ASPIRATION Left 02/2014  . BONE MARROW BIOPSY Left 02/2014  . CHOLECYSTECTOMY    . COLONOSCOPY  12/18/2011   Procedure: COLONOSCOPY;  Surgeon: Lafayette Dragon, MD;  Location: [facility];  Service: Endoscopy;  Laterality: N/A;  . COLONOSCOPY WITH PROPOFOL N/A 08/03/2017   Procedure: COLONOSCOPY WITH PROPOFOL;  Surgeon: Rogene Houston, MD;  Location: [facility];  Service: Endoscopy;  Laterality: N/A;  . CYSTOSCOPY/RETROGRADE/URETEROSCOPY/[place] EXTRACTION WITH BASKET Bilateral 04/18/2018   Procedure: CYSTOSCOPY/BILATERAL RETROGRADE/URETEROSCOPY/[place] EXTRACTION WITH BASKET WITH RIGHT STENT PLACEMENT;  Surgeon: Cleon Gustin, MD;  Location: [facility] ORS;  Service: Urology;  Laterality: Bilateral;  1 HR  . ESOPHAGEAL DILATION N/A 08/03/2017   Procedure: ESOPHAGEAL DILATION;  Surgeon: Rogene Houston, MD;  [facility]: [facility];  Service: Endoscopy;  Laterality: N/A;  . ESOPHAGOGASTRODUODENOSCOPY  12/18/2011   Procedure: ESOPHAGOGASTRODUODENOSCOPY (EGD);  Surgeon: Lafayette Dragon, MD;  Location: [facility];  Service: Endoscopy;  Laterality: N/A;  . ESOPHAGOGASTRODUODENOSCOPY (EGD) WITH PROPOFOL N/A 08/03/2017   Procedure: ESOPHAGOGASTRODUODENOSCOPY (EGD) WITH PROPOFOL;  Surgeon: Rogene Houston, MD;  [facility];  Service: Endoscopy;  Laterality: N/A;  pt knows to arrive at 6:15  . HERNIA REPAIR    . HOLMIUM LASER APPLICATION Right 2/95/2841   Procedure: HOLMIUM LASER APPLICATION;  Surgeon: Cleon Gustin, MD;  Location:  [facility] ORS;  Service: Urology;  Laterality: Right;  . LUMBAR LAMINECTOMY FOR EPIDURAL ABSCESS Left 09/22/2013   Procedure: LUMBAR LAMINECTOMY FOR EPIDURAL ABSCESS LEFT LUMBAR FIVE-SACRAL ONE;  Surgeon: Charlie Pitter, MD;  Location: [facility];  Service: Neurosurgery;  Laterality: Left;  . RADIOLOGY WITH ANESTHESIA N/A 08/13/2014   Procedure:  RADIOLOGY WITH ANESTHESIA ;  Surgeon: Medication Radiologist, MD;  Location: [facility] OR[facility];  Service: Radiology;  Laterality: N/A;  . URETERAL STENT PLACEMENT    . VERTEBROPLASTY N/A 07/14/2013   Procedure: VERTEBROPLASTY WITH LUMBAR THREE BIOPSY;  Surgeon: Charlie Pitter, MD;  Location: [facility] ORS;  Service: Neurosurgery;  Laterality: N/A;  VERTEBROPLASTY WITH LUMBAR THREE BIOPSY     SOCIAL HISTORY:  Social History   Socioeconomic History  . Marital status: Single    Spouse name: Not on file  . Number of children: Not on file  . Years of education: Not on file  . Highest education level: Not on file  Occupational History  . Occupation: Armed forces operational officer: [employer]    Comment: [employer]  Tobacco Use  . Smoking status: Never Smoker  . Smokeless tobacco: Never Used  Substance and Sexual Activity  . Alcohol use: Yes    Alcohol/week: 0.0 standard drinks    Comment: social occ  . Drug use: No  . Sexual activity: Yes    Birth control/protection: None  Other Topics Concern  . Not on file  Social History Narrative  . Not on file   Social Determinants of Health   Financial Resource Strain:   . Difficulty of Paying Living Expenses: Not on file  Food Insecurity:   . Worried About Charity fundraiser in the Last Year: Not on file  . Ran Out of Food in the Last Year: Not on file  Transportation Needs:   . Lack of Transportation (Medical): Not on file  . Lack of Transportation (Non-Medical): Not on file  Physical Activity:   . Days of Exercise per Week: Not on file  . Minutes of Exercise per Session: Not on file  Stress:   . Feeling of Stress : Not on file  Social Connections:   . Frequency of Communication with Friends and Family: Not on file  . Frequency of Social Gatherings with Friends and Family: Not on file  . Attends Religious Services: Not on file  . Active Member of Clubs or Organizations: Not on file  . Attends Archivist Meetings: Not on file  .  Marital Status: Not on file  Intimate Partner Violence:   . Fear of Current or Ex-Partner: Not on file  . Emotionally Abused: Not on file  . Physically Abused: Not on file  . Sexually Abused: Not on file    FAMILY HISTORY:  Family History  Problem Relation Age of Onset  . Breast cancer Sister 98  . Breast cancer Other 47       aunt    CURRENT MEDICATIONS:  Outpatient Encounter Medications as of 03/10/2019  Medication Sig  . Carboxymethylcellulose Sodium (THERATEARS OP) Place 1 drop into both eyes daily as needed (dry eyes).  Marland Kitchen ibuprofen (ADVIL,MOTRIN) 200 MG tablet Take 2 tablets (400 mg total) by mouth daily as needed for mild pain.  Marland Kitchen omeprazole (PRILOSEC) 20 MG capsule Take 20 mg by mouth 2 (two) times daily before a meal.  . oxyCODONE (OXYCONTIN) 20 mg 12 hr tablet Take 20 mg by mouth daily.   Marland Kitchen oxyCODONE-acetaminophen (PERCOCET)  5-325 MG tablet Take 1 tablet by mouth every 4 (four) hours as needed for severe pain. (Patient not taking: Reported on 02/26/2019)   No facility-administered encounter medications on file as of 03/10/2019.    ALLERGIES:  Allergies  Allergen Reactions  . Chlorhexidine Gluconate Itching    Pt c/o itching after abd wipes.   . Morphine And Related Nausea And Vomiting    Headache  . Tetracycline Nausea Only     Vital signs: -Deferred due to telephone visit  Physical Exam -Deferred due to telephone visit -Patient was alert and oriented over the phone and in no acute distress.  LABORATORY DATA:  I have reviewed the labs as listed.  CBC    Component Value Date/Time   WBC 6.7 03/03/2019 0956   RBC 4.42 03/03/2019 0956   HGB 10.8 (L) 03/03/2019 0956   HGB 9.9 (L) 03/26/2014 0914   HCT 36.1 03/03/2019 0956   HCT 32.5 (L) 03/26/2014 0914   PLT 435 (H) 03/03/2019 0956   PLT 318 03/26/2014 0914   MCV 81.7 03/03/2019 0956   MCV 72 (L) 03/26/2014 0914   MCH 24.4 (L) 03/03/2019 0956   MCHC 29.9 (L) 03/03/2019 0956   RDW 16.3 (H) 03/03/2019  0956   RDW 17.1 (H) 03/26/2014 0914   LYMPHSABS 1.5 03/03/2019 0956   LYMPHSABS 1.4 03/26/2014 0914   MONOABS 0.4 03/03/2019 0956   MONOABS 0.5 03/26/2014 0914   EOSABS 0.2 03/03/2019 0956   EOSABS 0.2 03/26/2014 0914   BASOSABS 0.1 03/03/2019 0956   BASOSABS 0.1 03/26/2014 0914   CMP Latest Ref Rng & Units 03/03/2019 02/27/2019 04/18/2018  Glucose 70 - 99 mg/dL 99 91 79  BUN 8 - 23 mg/dL '12 11 23  ' Creatinine 0.44 - 1.00 mg/dL 0.72 0.71 0.97  Sodium 135 - 145 mmol/L 139 139 142  Potassium 3.5 - 5.1 mmol/L 3.9 3.6 3.5  Chloride 98 - 111 mmol/L 104 105 109  CO2 22 - 32 mmol/L '26 26 24  ' Calcium 8.9 - 10.3 mg/dL 10.1 9.7 9.9  Total Protein 6.5 - 8.1 g/dL 8.0 - -  Total Bilirubin 0.3 - 1.2 mg/dL 0.4 - -  Alkaline Phos 38 - 126 U/L 74 - -  AST 15 - 41 U/L 15 - -  ALT 0 - 44 U/L 11 - -     DIAGNOSTIC IMAGING:  I have independently reviewed the renal scan and discussed with the patient.  All questions were answered to patient's stated satisfaction. Encouraged patient to call with any new concerns or questions before his next visit to the cancer center and we can certain see him sooner, if needed.     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 -She received radiation to the lumbar spine. -Bone marrow biopsy showed no evidence of plasma cell neoplasm.  Bone marrow biopsy has normal cytogenetics. -Kappa lambda light chain ratio has been normal, SPEP/IEP has been normal. -Labs done on 03/03/2019 showed SPEP normal, light chain ratio normal. -She had a CT renal study done on 02/26/2019 which showed inflammation around her spine and kidneys.  This is due from radiation. -She remains on observation and is 5 years out from her diagnosis. -She will follow-up in 3 months with repeat labs to check her creatinine due to inflammation around her kidneys and spine.  2.  Osteoporosis: -She received 1 dose of  Reclast in 2016 and did not wish  to continue treatment. -DEXA scan done on 06/21/2017 showed a T score of -3.8 which is osteoporosis. -She has the option of Prolia 60 mg every 6 months. -Patient is taking calcium and vitamin D daily. -We will repeat her DEXA scan in May 2021.  3.  Iron deficiency anemia: -Colonoscopy on 07/2017 that showed hemorrhoids and polyps.  Pathology returned as tubular adenoma and no malignancy. -She was last given IV iron January 2019. -Labs on 03/03/2019 showed hemoglobin 10.8, ferritin 82, percent saturation 10 -I have recommended 2 infusions of IV iron 1 week apart. -Iron infusions have been holding her for about 12 months. -We will recheck her labs on her next visit.  4.  Hiatal hernia: -Patient was having problems with nausea and vomiting and was referred to Dr. Laural Golden of GI. -EGD done on 07/2017 showed a large hiatal hernia. -Patient was seen by surgery at [facility] for evaluation.      Orders placed this encounter:  Orders Placed This Encounter  Procedures  . Lactate dehydrogenase  . CBC with Differential/Platelet  . Comprehensive metabolic panel  . Ferritin  . Iron and TIBC  . Vitamin B12  . Vitamin D 25 hydroxy  . Folate      Francene Finders, FNP-C [place] [phone]

## 2019-03-13 ENCOUNTER — Other Ambulatory Visit (HOSPITAL_COMMUNITY): Payer: Self-pay | Admitting: Nurse Practitioner

## 2019-03-14 ENCOUNTER — Encounter (HOSPITAL_COMMUNITY): Payer: Self-pay

## 2019-03-14 ENCOUNTER — Other Ambulatory Visit: Payer: Self-pay

## 2019-03-14 ENCOUNTER — Inpatient Hospital Stay (HOSPITAL_COMMUNITY): Payer: PPO

## 2019-03-14 ENCOUNTER — Telehealth: Payer: Self-pay | Admitting: Urology

## 2019-03-14 VITALS — BP 121/60 | HR 53 | Temp 97.5°F | Resp 18

## 2019-03-14 DIAGNOSIS — D509 Iron deficiency anemia, unspecified: Secondary | ICD-10-CM | POA: Diagnosis not present

## 2019-03-14 DIAGNOSIS — M810 Age-related osteoporosis without current pathological fracture: Secondary | ICD-10-CM

## 2019-03-14 MED ORDER — SODIUM CHLORIDE 0.9 % IV SOLN: Freq: Once | INTRAVENOUS | Status: AC

## 2019-03-14 MED ORDER — SODIUM CHLORIDE 0.9 % IV SOLN
510.0000 mg | Freq: Once | INTRAVENOUS | Status: AC
  Administered 2019-03-14: 10:00:00 510 mg via INTRAVENOUS
  Filled 2019-03-14: qty 510

## 2019-03-14 NOTE — Telephone Encounter (Signed)
Autumn Duran wants to speak with Dr. Alyson Ingles only.  Has questions about CAT scan and inflammation on Kidney.  Stated she had infusion today.  [phone]  Thanks

## 2019-03-14 NOTE — Patient Instructions (Signed)
[facility]  Discharge Instructions:   _______________________________________________________________  Thank you for choosing [facility] at [facility] to provide your oncology and hematology care.  To afford each patient quality time with our providers, please arrive at least 15 minutes before your scheduled appointment.  You need to re-schedule your appointment if you arrive 10 or more minutes late.  We strive to give you quality time with our providers, and arriving late affects you and other patients whose appointments are after yours.  Also, if you no show three or more times for appointments you may be dismissed from the clinic.  Again, thank you for choosing  Cancer Center at  Hospital. Our hope is that these requests will allow you access to exceptional care and in a timely manner. _______________________________________________________________  If you have questions after your visit, please contact our office at [phone] between the hours of 8:30 a.m. and 5:00 p.m. Voicemails left after 4:30 p.m. will not be returned until the following business day. _______________________________________________________________  For prescription refill requests, have your pharmacy contact our office. _______________________________________________________________  Recommendations made by the consultant and any test results will be sent to your referring physician. _______________________________________________________________ 

## 2019-03-14 NOTE — Progress Notes (Signed)
Patient presents today for Feraheme infusion. MAR reviewed and updated. Patient has no complaints of any pain or changes since her last visit. Vital signs stable.   Feraheme given today per MD orders. Tolerated infusion without adverse affects. Vital signs stable. No complaints at this time. Discharged from clinic ambulatory. F/U with [facility] as scheduled.

## 2019-03-19 ENCOUNTER — Telehealth: Payer: Self-pay | Admitting: Urology

## 2019-03-19 NOTE — Telephone Encounter (Signed)
Taken care of

## 2019-03-19 NOTE — Telephone Encounter (Signed)
Patient called last Friday 03/14/19 and requested a call back from Dr. Alyson Ingles. It was entered in the system but not routed to Dr. Alyson Ingles. Patient called again today and states she has questions about her CT scan and inflammation on kidney and would like a return call.

## 2019-03-21 ENCOUNTER — Other Ambulatory Visit: Payer: Self-pay

## 2019-03-21 ENCOUNTER — Encounter (HOSPITAL_COMMUNITY): Payer: Self-pay

## 2019-03-21 ENCOUNTER — Inpatient Hospital Stay (HOSPITAL_COMMUNITY): Payer: PPO

## 2019-03-21 VITALS — BP 113/51 | HR 57 | Temp 98.0°F | Resp 18 | Wt 152.2 lb

## 2019-03-21 DIAGNOSIS — D509 Iron deficiency anemia, unspecified: Secondary | ICD-10-CM | POA: Diagnosis not present

## 2019-03-21 DIAGNOSIS — M810 Age-related osteoporosis without current pathological fracture: Secondary | ICD-10-CM

## 2019-03-21 MED ORDER — SODIUM CHLORIDE 0.9 % IV SOLN: Freq: Once | INTRAVENOUS | Status: AC

## 2019-03-21 MED ORDER — SODIUM CHLORIDE 0.9 % IV SOLN
510.0000 mg | Freq: Once | INTRAVENOUS | Status: AC
  Administered 2019-03-21: 510 mg via INTRAVENOUS
  Filled 2019-03-21: qty 510

## 2019-03-21 NOTE — Progress Notes (Signed)
Autumn Duran tolerated Feraheme infusion well without complaints or incident Peripheral IV site checked with positive blood return noted prior to and after infusion. VSS upon discharge. Pt discharged self ambulatory in satisfactory condition

## 2019-03-21 NOTE — Patient Instructions (Signed)
[facility] at [facility] Discharge Instructions  Received Feraheme infusion today. Follow-up as scheduled. Call clinic for any questions or concerns   Thank you for choosing [facility] at [facility] to provide your oncology and hematology care.  To afford each patient quality time with our provider, please arrive at least 15 minutes before your scheduled appointment time.   If you have a lab appointment with the Cancer Center please come in thru the Main Entrance and check in at the main information desk.  You need to re-schedule your appointment should you arrive 10 or more minutes late.  We strive to give you quality time with our providers, and arriving late affects you and other patients whose appointments are after yours.  Also, if you no show three or more times for appointments you may be dismissed from the clinic at the providers discretion.     Again, thank you for choosing [facility].  Our hope is that these requests will decrease the amount of time that you wait before being seen by our physicians.       _____________________________________________________________  Should you have questions after your visit to  Cancer Center, please contact our office at [phone] between the hours of 8:00 a.m. and 4:30 p.m.  Voicemails left after 4:00 p.m. will not be returned until the following business day.  For prescription refill requests, have your pharmacy contact our office and allow 72 hours.    Due to Covid, you will need to wear a mask upon entering the hospital. If you do not have a mask, a mask will be given to you at the Main Entrance upon arrival. For doctor visits, patients may have 1 support person with them. For treatment visits, patients can not have anyone with them due to social distancing guidelines and our immunocompromised population.     

## 2019-03-24 NOTE — Telephone Encounter (Signed)
error 

## 2019-04-01 ENCOUNTER — Other Ambulatory Visit (HOSPITAL_COMMUNITY): Payer: Self-pay | Admitting: Nurse Practitioner

## 2019-04-28 ENCOUNTER — Other Ambulatory Visit: Payer: Self-pay | Admitting: Obstetrics & Gynecology

## 2019-04-28 DIAGNOSIS — Z1231 Encounter for screening mammogram for malignant neoplasm of breast: Secondary | ICD-10-CM

## 2019-05-06 ENCOUNTER — Ambulatory Visit
Admission: RE | Admit: 2019-05-06 | Discharge: 2019-05-06 | Disposition: A | Discharge status: Home / Self Care | Payer: PPO | Source: Ambulatory Visit | Attending: Obstetrics & Gynecology | Admitting: Obstetrics & Gynecology

## 2019-05-06 ENCOUNTER — Other Ambulatory Visit: Payer: Self-pay

## 2019-05-06 DIAGNOSIS — Z1231 Encounter for screening mammogram for malignant neoplasm of breast: Secondary | ICD-10-CM

## 2019-05-20 DIAGNOSIS — M961 Postlaminectomy syndrome, not elsewhere classified: Secondary | ICD-10-CM | POA: Diagnosis not present

## 2019-05-20 DIAGNOSIS — Z01419 Encounter for gynecological examination (general) (routine) without abnormal findings: Secondary | ICD-10-CM | POA: Diagnosis not present

## 2019-05-20 DIAGNOSIS — Z6829 Body mass index (BMI) 29.0-29.9, adult: Secondary | ICD-10-CM | POA: Diagnosis not present

## 2019-05-20 DIAGNOSIS — M4626 Osteomyelitis of vertebra, lumbar region: Secondary | ICD-10-CM | POA: Diagnosis not present

## 2019-05-20 DIAGNOSIS — Z124 Encounter for screening for malignant neoplasm of cervix: Secondary | ICD-10-CM | POA: Diagnosis not present

## 2019-05-20 DIAGNOSIS — R1031 Right lower quadrant pain: Secondary | ICD-10-CM | POA: Diagnosis not present

## 2019-06-05 ENCOUNTER — Other Ambulatory Visit (HOSPITAL_COMMUNITY): Payer: PPO

## 2019-06-05 DIAGNOSIS — R05 Cough: Secondary | ICD-10-CM | POA: Diagnosis not present

## 2019-06-05 DIAGNOSIS — Z299 Encounter for prophylactic measures, unspecified: Secondary | ICD-10-CM | POA: Diagnosis not present

## 2019-06-05 DIAGNOSIS — J329 Chronic sinusitis, unspecified: Secondary | ICD-10-CM | POA: Diagnosis not present

## 2019-06-05 DIAGNOSIS — I10 Essential (primary) hypertension: Secondary | ICD-10-CM | POA: Diagnosis not present

## 2019-06-10 ENCOUNTER — Inpatient Hospital Stay (HOSPITAL_COMMUNITY): Payer: PPO

## 2019-06-12 ENCOUNTER — Ambulatory Visit (HOSPITAL_COMMUNITY): Payer: PPO | Admitting: Nurse Practitioner

## 2019-06-12 ENCOUNTER — Other Ambulatory Visit: Payer: Self-pay

## 2019-06-12 ENCOUNTER — Inpatient Hospital Stay (HOSPITAL_COMMUNITY): Payer: PPO | Attending: Hematology

## 2019-06-12 DIAGNOSIS — R112 Nausea with vomiting, unspecified: Secondary | ICD-10-CM | POA: Diagnosis not present

## 2019-06-12 DIAGNOSIS — E785 Hyperlipidemia, unspecified: Secondary | ICD-10-CM | POA: Diagnosis not present

## 2019-06-12 DIAGNOSIS — D509 Iron deficiency anemia, unspecified: Secondary | ICD-10-CM | POA: Diagnosis not present

## 2019-06-12 DIAGNOSIS — Z803 Family history of malignant neoplasm of breast: Secondary | ICD-10-CM | POA: Diagnosis not present

## 2019-06-12 DIAGNOSIS — E559 Vitamin D deficiency, unspecified: Secondary | ICD-10-CM | POA: Insufficient documentation

## 2019-06-12 DIAGNOSIS — C9030 Solitary plasmacytoma not having achieved remission: Secondary | ICD-10-CM

## 2019-06-12 DIAGNOSIS — E876 Hypokalemia: Secondary | ICD-10-CM | POA: Insufficient documentation

## 2019-06-12 DIAGNOSIS — I10 Essential (primary) hypertension: Secondary | ICD-10-CM | POA: Insufficient documentation

## 2019-06-12 DIAGNOSIS — Z87442 Personal history of urinary calculi: Secondary | ICD-10-CM | POA: Insufficient documentation

## 2019-06-12 DIAGNOSIS — K449 Diaphragmatic hernia without obstruction or gangrene: Secondary | ICD-10-CM | POA: Insufficient documentation

## 2019-06-12 DIAGNOSIS — Z79899 Other long term (current) drug therapy: Secondary | ICD-10-CM | POA: Diagnosis not present

## 2019-06-12 DIAGNOSIS — N189 Chronic kidney disease, unspecified: Secondary | ICD-10-CM | POA: Insufficient documentation

## 2019-06-12 DIAGNOSIS — M810 Age-related osteoporosis without current pathological fracture: Secondary | ICD-10-CM | POA: Diagnosis not present

## 2019-06-12 LAB — CBC WITH DIFFERENTIAL/PLATELET
Abs Immature Granulocytes: 0.02 10*3/uL (ref 0.00–0.07)
Basophils Absolute: 0.1 10*3/uL (ref 0.0–0.1)
Basophils Relative: 1 %
Eosinophils Absolute: 0.3 10*3/uL (ref 0.0–0.5)
Eosinophils Relative: 4 %
HCT: 39.9 % (ref 36.0–46.0)
Hemoglobin: 12.5 g/dL (ref 12.0–15.0)
Immature Granulocytes: 0 %
Lymphocytes Relative: 25 %
Lymphs Abs: 1.7 10*3/uL (ref 0.7–4.0)
MCH: 27.2 pg (ref 26.0–34.0)
MCHC: 31.3 g/dL (ref 30.0–36.0)
MCV: 86.9 fL (ref 80.0–100.0)
Monocytes Absolute: 0.3 10*3/uL (ref 0.1–1.0)
Monocytes Relative: 5 %
Neutro Abs: 4.4 10*3/uL (ref 1.7–7.7)
Neutrophils Relative %: 65 %
Platelets: 317 10*3/uL (ref 150–400)
RBC: 4.59 MIL/uL (ref 3.87–5.11)
RDW: 14 % (ref 11.5–15.5)
WBC: 6.8 10*3/uL (ref 4.0–10.5)
nRBC: 0 % (ref 0.0–0.2)

## 2019-06-12 LAB — FOLATE: Folate: 11.5 ng/mL (ref >5.9)

## 2019-06-12 LAB — VITAMIN B12: Vitamin B-12: 420 pg/mL (ref 180–914)

## 2019-06-12 LAB — FERRITIN: Ferritin: 262 ng/mL (ref 11–307)

## 2019-06-12 LAB — COMPREHENSIVE METABOLIC PANEL
ALT: 11 U/L (ref 0–44)
AST: 16 U/L (ref 15–41)
Albumin: 3.5 g/dL (ref 3.5–5.0)
Alkaline Phosphatase: 79 U/L (ref 38–126)
Anion gap: 8 (ref 5–15)
BUN: 14 mg/dL (ref 8–23)
CO2: 28 mmol/L (ref 22–32)
Calcium: 9.9 mg/dL (ref 8.9–10.3)
Chloride: 102 mmol/L (ref 98–111)
Creatinine, Ser: 0.77 mg/dL (ref 0.44–1.00)
GFR calc Af Amer: >60 mL/min (ref >60)
GFR calc non Af Amer: >60 mL/min (ref >60)
Glucose, Bld: 97 mg/dL (ref 70–99)
Potassium: 4.1 mmol/L (ref 3.5–5.1)
Sodium: 138 mmol/L (ref 135–145)
Total Bilirubin: 0.4 mg/dL (ref 0.3–1.2)
Total Protein: 7.8 g/dL (ref 6.5–8.1)

## 2019-06-12 LAB — IRON AND TIBC
Iron: 37 ug/dL (ref 28–170)
Saturation Ratios: 15 % (ref 10.4–31.8)
TIBC: 242 ug/dL — ABNORMAL LOW (ref 250–450)
UIBC: 205 ug/dL

## 2019-06-12 LAB — VITAMIN D 25 HYDROXY (VIT D DEFICIENCY, FRACTURES): Vit D, 25-Hydroxy: 17.5 ng/mL — ABNORMAL LOW (ref 30–100)

## 2019-06-12 LAB — LACTATE DEHYDROGENASE: LDH: 110 U/L (ref 98–192)

## 2019-06-17 ENCOUNTER — Inpatient Hospital Stay (HOSPITAL_BASED_OUTPATIENT_CLINIC_OR_DEPARTMENT_OTHER): Payer: PPO | Admitting: Nurse Practitioner

## 2019-06-17 ENCOUNTER — Other Ambulatory Visit: Payer: Self-pay

## 2019-06-17 DIAGNOSIS — M810 Age-related osteoporosis without current pathological fracture: Secondary | ICD-10-CM | POA: Diagnosis not present

## 2019-06-17 DIAGNOSIS — E559 Vitamin D deficiency, unspecified: Secondary | ICD-10-CM

## 2019-06-17 DIAGNOSIS — D508 Other iron deficiency anemias: Secondary | ICD-10-CM

## 2019-06-17 MED ORDER — ERGOCALCIFEROL 1.25 MG (50000 UT) PO CAPS: 50000.0000 [IU] | ORAL_CAPSULE | ORAL | 3 refills | Status: DC

## 2019-06-17 NOTE — Assessment & Plan Note (Addendum)
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 -She received radiation to the lumbar spine. -Bone marrow biopsy showed no evidence of plasma cell neoplasm.  Bone marrow biopsy has normal cytogenetics. -Kappa lambda light chain ratio has been normal, SPEP/IEP has been normal. -Labs done on 03/03/2019 showed SPEP normal, light chain ratio normal. -She had a CT renal study done on 02/26/2019 which showed inflammation around her spine and kidneys.  This is due from radiation. -Patient also reports inflammation around her kidney that flares up every couple of months she needs antibiotics to help clear this up. She was on doxycyline prophylactically, however was taken off due to not having many flares.  -She remains on observation and is 5 years out from her diagnosis. -She will follow-up in 3 months with repeat labs to check her creatinine due to inflammation around her kidneys and spine.  2.  Osteoporosis: -She received 1 dose of Reclast in 2016 and did not wish to continue treatment. -DEXA scan done on 06/21/2017 showed a T score of -3.8 which is osteoporosis. -She has the option of Prolia 60 mg every 6 months. -Patient is taking calcium and vitamin D daily. -We will repeat her DEXA scan in May 2021.  3.  Iron deficiency anemia: -Colonoscopy on 07/2017 that showed hemorrhoids and polyps.  Pathology returned as tubular adenoma and no malignancy. -She was last given IV iron January 2019. -Labs on 06/12/2019 showed hemoglobin 10.8, ferritin 262, percent saturation 15 -Iron infusions have been holding her for about 12 months. -We will recheck her labs on her next visit.  4.  Hiatal hernia: -Patient was having problems with nausea and vomiting and was referred to Dr. Laural Golden of GI. -EGD done on 07/2017 showed a large hiatal hernia. -Patient was seen by surgery at [facility] for evaluation.  5. Vitamin D deficiency: -Labs done on 06/12/2019 showed vit D level  17.50 -We will call her in a prescriptions for 50,000 units weekly. -We will recheck her levels on her next visit.

## 2019-06-17 NOTE — Progress Notes (Signed)
[facility] Follow up:    Autumn Chroman, MD [address] [place] [address]   DIAGNOSIS: Plasmacytoma of the bones and iron deficiency anemia.   CURRENT THERAPY: Intermittent iron infusions.   INTERVAL HISTORY: Autumn Duran 69 y.o. female returns for a follow up visit for iron deficiency anemia and plasmacytoma of the bones. She is doing well over all and has very few problems at this time. She does report kidney pain occasionally due to inflammation around her kidney. She reports a round of antibiotics helps clear this up and relive her pain. Denies any nausea, vomiting, or diarrhea. Denies any new pains. Had not noticed any recent bleeding such as epistaxis, hematuria or hematochezia. Denies recent chest pain on exertion, shortness of breath on minimal exertion, pre-syncopal episodes, or palpitations. Denies any numbness or tingling in hands or feet. Denies any recent fevers, infections, or recent hospitalizations. Patient reports appetite at 100% and energy level at 75%. She is eating well and maintaining her weight at this time.     Patient Active Problem List   Diagnosis Date Noted  . Osteoporosis 12/10/2017  . Dysphagia 06/26/2017  . Gastroesophageal reflux disease 06/26/2017  . Hx of colonic polyps 06/26/2017  . Nausea with vomiting 10/12/2014  . MRSA infection   . Osteomyelitis of lumbar spine ([place]) 08/13/2014  . Acute low back pain 08/13/2014  . Epidural abscess 08/13/2014  . Hypertension   . Chronic back pain   . Essential hypertension   . Other iron deficiency anemia 04/10/2014  . 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[place]) 03/23/2014  . Right hip pain 12/23/2013  . Epidural abscess, L2-L5 11/04/2013  . Sepsis ([place]) 09/21/2013  . Hypercalcemia 08/20/2013  . Hypokalemia 08/20/2013  . Lumbar compression fracture ([place]) 07/12/2013  . Compression fracture 07/12/2013  . Benign neoplasm of colon 12/18/2011  . Esophageal reflux 12/18/2011  . Other dysphagia 12/18/2011     is allergic to chlorhexidine gluconate; morphine and related; and tetracycline.  MEDICAL HISTORY: Past Medical History:  Diagnosis Date  . Bronchitis, chronic ([place])   . Chronic back pain   . Chronic kidney disease    kidney stone s/p stent placement ( removed)  . Complication of anesthesia   . Compression fracture of L3 lumbar vertebra   . Diverticulosis   . GERD (gastroesophageal reflux disease)   . Hemorrhoids   . Hiatal hernia   . History of kidney stones   . Hx of adenomatous colonic polyps   . Hyperlipidemia   . Hypertension   . Lumbar radicular pain   . Osteoporosis 12/10/2017  . PONV (postoperative nausea and vomiting)   . Smoldering myeloma ([place])   . Sore throat   . T7 vertebral fracture ([place])     SURGICAL HISTORY: Past Surgical History:  Procedure Laterality Date  . BACK SURGERY    . BONE MARROW ASPIRATION Left 02/2014  . BONE MARROW BIOPSY Left 02/2014  . CHOLECYSTECTOMY    . COLONOSCOPY  12/18/2011   Procedure: COLONOSCOPY;  Surgeon: Lafayette Dragon, MD;  Location: [facility];  Service: Endoscopy;  Laterality: N/A;  . COLONOSCOPY WITH PROPOFOL N/A 08/03/2017   Procedure: COLONOSCOPY WITH PROPOFOL;  Surgeon: Rogene Houston, MD;  Location: [facility];  Service: Endoscopy;  Laterality: N/A;  . CYSTOSCOPY/RETROGRADE/URETEROSCOPY/STONE EXTRACTION WITH BASKET Bilateral 04/18/2018   Procedure: CYSTOSCOPY/BILATERAL RETROGRADE/URETEROSCOPY/STONE EXTRACTION WITH BASKET WITH RIGHT STENT PLACEMENT;  Surgeon: Cleon Gustin, MD;  Location: [facility];  Service: Urology;  Laterality: Bilateral;  1 HR  . ESOPHAGEAL DILATION N/A 08/03/2017   Procedure: ESOPHAGEAL DILATION;  Surgeon: Rogene Houston, MD;  Location: [facility];  Service: Endoscopy;  Laterality: N/A;  . ESOPHAGOGASTRODUODENOSCOPY  12/18/2011   Procedure: ESOPHAGOGASTRODUODENOSCOPY (EGD);  Surgeon: Lafayette Dragon, MD;  Location: [facility];  Service: Endoscopy;  Laterality: N/A;  .  ESOPHAGOGASTRODUODENOSCOPY (EGD) WITH PROPOFOL N/A 08/03/2017   Procedure: ESOPHAGOGASTRODUODENOSCOPY (EGD) WITH PROPOFOL;  Surgeon: Rogene Houston, MD;  [facility];  Service: Endoscopy;  Laterality: N/A;  pt knows to arrive at 6:15  . HERNIA REPAIR    . HOLMIUM LASER APPLICATION Right 6/64/4034   Procedure: HOLMIUM LASER APPLICATION;  Surgeon: Cleon Gustin, MD;  Location: [facility];  Service: Urology;  Laterality: Right;  . LUMBAR LAMINECTOMY FOR EPIDURAL ABSCESS Left 09/22/2013   Procedure: LUMBAR LAMINECTOMY FOR EPIDURAL ABSCESS LEFT LUMBAR FIVE-SACRAL ONE;  Surgeon: Charlie Pitter, MD;  Location: [facility];  Service: Neurosurgery;  Laterality: Left;  . RADIOLOGY WITH ANESTHESIA N/A 08/13/2014   Procedure: RADIOLOGY WITH ANESTHESIA ;  Surgeon: Medication Radiologist, MD;  Location: [facility];  Service: Radiology;  Laterality: N/A;  . URETERAL STENT PLACEMENT    . VERTEBROPLASTY N/A 07/14/2013   Procedure: VERTEBROPLASTY WITH LUMBAR THREE BIOPSY;  Surgeon: Charlie Pitter, MD;  Location: [facility];  Service: Neurosurgery;  Laterality: N/A;  VERTEBROPLASTY WITH LUMBAR THREE BIOPSY    SOCIAL HISTORY: Social History   Socioeconomic History  . Marital status: Single    Spouse name: Not on file  . Number of children: Not on file  . Years of education: Not on file  . Highest education level: Not on file  Occupational History  . Occupation: Armed forces operational officer: [employer]    Comment: [employer]  Tobacco Use  . Smoking status: Never Smoker  . Smokeless tobacco: Never Used  Substance and Sexual Activity  . Alcohol use: Yes    Alcohol/week: 0.0 standard drinks    Comment: social occ  . Drug use: No  . Sexual activity: Yes    Birth control/protection: None  Other Topics Concern  . Not on file  Social History Narrative  . Not on file   Social Determinants of Health   Financial Resource Strain:   . Difficulty of Paying Living Expenses:   Food Insecurity:   .  Worried About Charity fundraiser in the Last Year:   . Arboriculturist in the Last Year:   Transportation Needs:   . Film/video editor (Medical):   Marland Kitchen Lack of Transportation (Non-Medical):   Physical Activity:   . Days of Exercise per Week:   . Minutes of Exercise per Session:   Stress:   . Feeling of Stress :   Social Connections:   . Frequency of Communication with Friends and Family:   . Frequency of Social Gatherings with Friends and Family:   . Attends Religious Services:   . Active Member of Clubs or Organizations:   . Attends Archivist Meetings:   Marland Kitchen Marital Status:   Intimate Partner Violence:   . Fear of Current or Ex-Partner:   . Emotionally Abused:   Marland Kitchen Physically Abused:   . Sexually Abused:     FAMILY HISTORY: Family History  Problem Relation Age of Onset  . Breast cancer Sister 33  . Breast cancer Other 89       aunt    Review of Systems  Constitutional: Positive  for fatigue.  All other systems reviewed and are negative.     Vital Signs: deferred due to telephone visit.  Physical Exam  -Deferred due to telephone visit. -Patient was alert and oriented over the phone and in no acute distress.    LABORATORY DATA:  CBC    Component Value Date/Time   WBC 6.8 06/12/2019 1014   RBC 4.59 06/12/2019 1014   HGB 12.5 06/12/2019 1014   HGB 9.9 (L) 03/26/2014 0914   HCT 39.9 06/12/2019 1014   HCT 32.5 (L) 03/26/2014 0914   PLT 317 06/12/2019 1014   PLT 318 03/26/2014 0914   MCV 86.9 06/12/2019 1014   MCV 72 (L) 03/26/2014 0914   MCH 27.2 06/12/2019 1014   MCHC 31.3 06/12/2019 1014   RDW 14.0 06/12/2019 1014   RDW 17.1 (H) 03/26/2014 0914   LYMPHSABS 1.7 06/12/2019 1014   LYMPHSABS 1.4 03/26/2014 0914   MONOABS 0.3 06/12/2019 1014   MONOABS 0.5 03/26/2014 0914   EOSABS 0.3 06/12/2019 1014   EOSABS 0.2 03/26/2014 0914   BASOSABS 0.1 06/12/2019 1014   BASOSABS 0.1 03/26/2014 0914    CMP     Component Value Date/Time   NA 138  06/12/2019 1014   K 4.1 06/12/2019 1014   CL 102 06/12/2019 1014   CO2 28 06/12/2019 1014   GLUCOSE 97 06/12/2019 1014   BUN 14 06/12/2019 1014   CREATININE 0.77 06/12/2019 1014   CREATININE 0.71 02/27/2019 1157   CALCIUM 9.9 06/12/2019 1014   CALCIUM 10.4 (H) 03/19/2014 1005   PROT 7.8 06/12/2019 1014   ALBUMIN 3.5 06/12/2019 1014   AST 16 06/12/2019 1014   ALT 11 06/12/2019 1014   ALKPHOS 79 06/12/2019 1014   BILITOT 0.4 06/12/2019 1014   GFRNONAA >60 06/12/2019 1014   GFRAA >60 06/12/2019 1014     ASSESSMENT and THERAPY PLAN:   Other iron deficiency anemia 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 -She received radiation to the lumbar spine. -Bone marrow biopsy showed no evidence of plasma cell neoplasm.  Bone marrow biopsy has normal cytogenetics. -Kappa lambda light chain ratio has been normal, SPEP/IEP has been normal. -Labs done on 03/03/2019 showed SPEP normal, light chain ratio normal. -She had a CT renal study done on 02/26/2019 which showed inflammation around her spine and kidneys.  This is due from radiation. -Patient also reports inflammation around her kidney that flares up every couple of months she needs antibiotics to help clear this up. She was on doxycyline prophylactically, however was taken off due to not having many flares.  -She remains on observation and is 5 years out from her diagnosis. -She will follow-up in 3 months with repeat labs to check her creatinine due to inflammation around her kidneys and spine.  2.  Osteoporosis: -She received 1 dose of Reclast in 2016 and did not wish to continue treatment. -DEXA scan done on 06/21/2017 showed a T score of -3.8 which is osteoporosis. -She has the option of Prolia 60 mg every 6 months. -Patient is taking calcium and vitamin D daily. -We will repeat her DEXA scan in May 2021.  3.  Iron deficiency anemia: -Colonoscopy on 07/2017 that showed hemorrhoids and  polyps.  Pathology returned as tubular adenoma and no malignancy. -She was last given IV iron January 2019. -Labs on 06/12/2019 showed hemoglobin 10.8, ferritin 262, percent saturation 15 -Iron infusions have been holding her for about 12 months. -We will recheck her  labs on her next visit.  4.  Hiatal hernia: -Patient was having problems with nausea and vomiting and was referred to Dr. Laural Golden of GI. -EGD done on 07/2017 showed a large hiatal hernia. -Patient was seen by surgery at [facility] for evaluation.  5. Vitamin D deficiency: -Labs done on 06/12/2019 showed vit D level 17.50 -We will call her in a prescriptions for 50,000 units weekly. -We will recheck her levels on her next visit.    Orders Placed This Encounter  Procedures  . DG Bone Density    Standing Status:   Future    Standing Expiration Date:   06/16/2020    Order Specific Question:   Reason for Exam (SYMPTOM  OR DIAGNOSIS REQUIRED)    Answer:   osteoprosis    Order Specific Question:   Preferred imaging location?    Answer:   [facility]  . Lactate dehydrogenase    Standing Status:   Future    Standing Expiration Date:   06/16/2020  . CBC with Differential/Platelet    Standing Status:   Future    Standing Expiration Date:   06/16/2020  . Comprehensive metabolic panel    Standing Status:   Future    Standing Expiration Date:   06/16/2020  . Ferritin    Standing Status:   Future    Standing Expiration Date:   06/16/2020  . Iron and TIBC    Standing Status:   Future    Standing Expiration Date:   06/16/2020  . Vitamin B12    Standing Status:   Future    Standing Expiration Date:   06/16/2020  . VITAMIN D 25 Hydroxy (Vit-D Deficiency, Fractures)    Standing Status:   Future    Standing Expiration Date:   06/16/2020  . Folate    Standing Status:   Future    Standing Expiration Date:   06/16/2020    All questions were answered. The patient knows to call the clinic with any problems, questions or concerns. We can  certainly see the patient much sooner if necessary. This note was electronically signed.   Glennie Isle, NP-C 06/17/2019

## 2019-07-04 ENCOUNTER — Telehealth (HOSPITAL_COMMUNITY): Payer: Self-pay | Admitting: *Deleted

## 2019-07-04 ENCOUNTER — Other Ambulatory Visit (HOSPITAL_COMMUNITY): Payer: Self-pay | Admitting: Nurse Practitioner

## 2019-07-04 NOTE — Telephone Encounter (Signed)
Pt called into clinic stating she was having right abdominal pain between her hip and leg. I spoke with Paulino Door and she stated she was would call pt back.

## 2019-08-27 ENCOUNTER — Ambulatory Visit: Payer: PPO | Admitting: Urology

## 2019-09-04 DIAGNOSIS — C9000 Multiple myeloma not having achieved remission: Secondary | ICD-10-CM | POA: Diagnosis not present

## 2019-09-04 DIAGNOSIS — K5900 Constipation, unspecified: Secondary | ICD-10-CM | POA: Diagnosis not present

## 2019-09-04 DIAGNOSIS — R109 Unspecified abdominal pain: Secondary | ICD-10-CM | POA: Diagnosis not present

## 2019-09-04 DIAGNOSIS — Z299 Encounter for prophylactic measures, unspecified: Secondary | ICD-10-CM | POA: Diagnosis not present

## 2019-09-04 DIAGNOSIS — C9030 Solitary plasmacytoma not having achieved remission: Secondary | ICD-10-CM | POA: Diagnosis not present

## 2019-09-04 DIAGNOSIS — I10 Essential (primary) hypertension: Secondary | ICD-10-CM | POA: Diagnosis not present

## 2019-09-16 ENCOUNTER — Other Ambulatory Visit (HOSPITAL_COMMUNITY): Payer: PPO

## 2019-09-18 DIAGNOSIS — M961 Postlaminectomy syndrome, not elsewhere classified: Secondary | ICD-10-CM | POA: Diagnosis not present

## 2019-09-18 DIAGNOSIS — M4626 Osteomyelitis of vertebra, lumbar region: Secondary | ICD-10-CM | POA: Diagnosis not present

## 2019-09-18 DIAGNOSIS — M5416 Radiculopathy, lumbar region: Secondary | ICD-10-CM | POA: Diagnosis not present

## 2019-09-18 DIAGNOSIS — F1120 Opioid dependence, uncomplicated: Secondary | ICD-10-CM | POA: Insufficient documentation

## 2019-09-18 DIAGNOSIS — M5136 Other intervertebral disc degeneration, lumbar region: Secondary | ICD-10-CM | POA: Diagnosis not present

## 2019-09-22 ENCOUNTER — Other Ambulatory Visit (HOSPITAL_COMMUNITY): Payer: PPO

## 2019-09-23 ENCOUNTER — Inpatient Hospital Stay (HOSPITAL_COMMUNITY): Payer: PPO | Attending: Hematology

## 2019-09-23 ENCOUNTER — Other Ambulatory Visit (HOSPITAL_COMMUNITY): Payer: Self-pay | Admitting: Neurosurgery

## 2019-09-23 ENCOUNTER — Other Ambulatory Visit: Payer: Self-pay | Admitting: Neurosurgery

## 2019-09-23 ENCOUNTER — Ambulatory Visit (HOSPITAL_COMMUNITY): Payer: PPO | Admitting: Nurse Practitioner

## 2019-09-23 ENCOUNTER — Ambulatory Visit (HOSPITAL_COMMUNITY): Payer: PPO

## 2019-09-23 DIAGNOSIS — M961 Postlaminectomy syndrome, not elsewhere classified: Secondary | ICD-10-CM

## 2019-09-24 ENCOUNTER — Ambulatory Visit: Payer: PPO | Admitting: Urology

## 2019-09-25 ENCOUNTER — Ambulatory Visit (HOSPITAL_COMMUNITY): Payer: PPO | Admitting: Nurse Practitioner

## 2019-10-07 ENCOUNTER — Ambulatory Visit: Payer: PPO | Admitting: Urology

## 2019-10-10 ENCOUNTER — Ambulatory Visit (HOSPITAL_COMMUNITY)
Admission: RE | Admit: 2019-10-10 | Discharge: 2019-10-10 | Disposition: A | Discharge status: Home / Self Care | Payer: PPO | Source: Ambulatory Visit | Attending: Neurosurgery | Admitting: Neurosurgery

## 2019-10-10 ENCOUNTER — Other Ambulatory Visit: Payer: Self-pay

## 2019-10-10 DIAGNOSIS — M961 Postlaminectomy syndrome, not elsewhere classified: Secondary | ICD-10-CM | POA: Insufficient documentation

## 2019-10-10 DIAGNOSIS — M545 Low back pain: Secondary | ICD-10-CM | POA: Diagnosis not present

## 2019-10-23 DIAGNOSIS — M5136 Other intervertebral disc degeneration, lumbar region: Secondary | ICD-10-CM | POA: Diagnosis not present

## 2019-10-23 DIAGNOSIS — M4626 Osteomyelitis of vertebra, lumbar region: Secondary | ICD-10-CM | POA: Diagnosis not present

## 2019-10-23 DIAGNOSIS — M961 Postlaminectomy syndrome, not elsewhere classified: Secondary | ICD-10-CM | POA: Diagnosis not present

## 2019-11-20 ENCOUNTER — Other Ambulatory Visit: Payer: Self-pay

## 2019-11-20 ENCOUNTER — Encounter: Payer: Self-pay | Admitting: Urology

## 2019-11-20 ENCOUNTER — Ambulatory Visit (INDEPENDENT_AMBULATORY_CARE_PROVIDER_SITE_OTHER): Payer: PPO | Admitting: Urology

## 2019-11-20 VITALS — BP 158/64 | HR 64 | Temp 97.7°F | Ht 61.0 in | Wt 151.2 lb

## 2019-11-20 DIAGNOSIS — N135 Crossing vessel and stricture of ureter without hydronephrosis: Secondary | ICD-10-CM

## 2019-11-20 DIAGNOSIS — N200 Calculus of kidney: Secondary | ICD-10-CM | POA: Diagnosis not present

## 2019-11-20 LAB — MICROSCOPIC EXAMINATION: Renal Epithel, UA: NONE SEEN /hpf

## 2019-11-20 LAB — URINALYSIS, ROUTINE W REFLEX MICROSCOPIC
Bilirubin, UA: NEGATIVE
Glucose, UA: NEGATIVE
Ketones, UA: NEGATIVE
Nitrite, UA: NEGATIVE
Specific Gravity, UA: 1.02 (ref 1.005–1.030)
Urobilinogen, Ur: 0.2 mg/dL (ref 0.2–1.0)
pH, UA: 6 (ref 5.0–7.5)

## 2019-11-20 NOTE — Patient Instructions (Signed)
Hydronephrosis  Hydronephrosis is the swelling of one or both kidneys due to a blockage that stops urine from flowing out of the body. Kidneys filter waste from the blood and produce urine. This condition can lead to kidney failure and may become life threatening if not treated promptly. What are the causes? Common causes of this condition include:  Problems that occur when a baby is developing in the womb (congenital defect). These can include problems: ? In the kidneys. ? In the tubes that drain urine from the kidneys into the bladder (ureters).  Kidney stones.  Bladder infection.  An enlarged prostate gland.  Scar tissue from a previous surgery or injury.  A blood clot.  A tumor or cyst in the abdomen or pelvis.  Cancer of the prostate, bladder, uterus, ovary, or colon. What are the signs or symptoms? Symptoms of this condition include:  Pain or discomfort in your side (flank).  Pain and swelling in your abdomen.  Nausea and vomiting.  Fever.  Pain when passing urine.  Feelings of urgency when you need to urinate.  Urinating more often than normal. In some cases, you may not have any symptoms. How is this diagnosed? This condition may be diagnosed based on:  Your symptoms and medical history.  A physical exam.  Blood and urine tests.  Imaging tests, such as an ultrasound, CT scan, or MRI.  A procedure in which a scope is inserted into the urethra and used to view parts of the urinary tract and bladder (cystoscopy). How is this treated? Treatment for this condition depends on where the blockage is, how long it has been there, and what caused it. The goal of treatment is to remove the blockage. Treatment may include:  Antibiotic medicines to treat or prevent infection.  A procedure to place a small, thin tube (stent) into a blocked ureter. The stent will keep the ureter open so that urine can drain through it.  A nonsurgical procedure that crushes kidney  stones with shock waves (extracorporeal shock wave lithotripsy).  If kidney failure occurs, treatment may include dialysis or a kidney transplant. Follow these instructions at home:   Take over-the-counter and prescription medicines only as told by your health care provider.  Rest and return to your normal activities as told by your health care provider. Ask your health care provider what activities are safe for you.  Drink enough fluid to keep your urine pale yellow.  If you were prescribed an antibiotic medicine, take it exactly as told by your health care provider. Do not stop taking the antibiotic even if you start to feel better.  Keep all follow-up visits as told by your health care provider. This is important. Contact a health care provider if:  You continue to have symptoms after treatment.  You develop new symptoms.  Your urine becomes cloudy or bloody.  You have a fever. Get help right away if:  You have severe flank or abdominal pain.  You cannot drink fluids without vomiting. Summary  Hydronephrosis is the swelling of one or both kidneys due to a blockage that stops urine from flowing out of the body.  Hydronephrosis can lead to kidney failure and may become life threatening if not treated promptly.  The goal of treatment is to treat the cause of the blockage. It may include insertion of stent into a blocked ureter, a procedure to treat kidney stones, and antibiotic medicines.  Follow your health care provider's instructions for taking care of yourself at   home, including instructions about drinking fluids, taking medicines, and limiting activities. This information is not intended to replace advice given to you by your health care provider. Make sure you discuss any questions you have with your health care provider. Document Revised: 02/24/2017 Document Reviewed: 02/24/2017 Elsevier Patient Education  2020 [employer] Inc[employer]  

## 2019-11-20 NOTE — Progress Notes (Signed)
Urological Symptom Review  Patient is experiencing the following symptoms: none   Review of Systems  Gastrointestinal (upper)  : Negative for upper GI symptoms  Gastrointestinal (lower) : Constipation  Constitutional : Night Sweats Fatigue  Skin: Negative for skin symptoms  Eyes: Negative for eye symptoms  Ear/Nose/Throat : Sinus problems  Hematologic/Lymphatic: Negative for Hematologic/Lymphatic symptoms  Cardiovascular : Negative for cardiovascular symptoms  Respiratory : Cough  Endocrine: Negative for endocrine symptoms  Musculoskeletal: Back pain  Neurological: Negative for neurological symptoms  Psychologic: Negative for psychiatric symptoms

## 2019-11-20 NOTE — Progress Notes (Signed)
11/20/2019 9:58 AM   [name] Autumn Duran [birth year] [id number]  Referring provider: [name], MD [place],  [place] [address]  followup hydronephrosis  HPI: [name] is a her for followup for nephrolithiasis and hydronephrosis. She continue to have bilateral mild dull flank pain. She was seen by Oncology at [facility] and was started on iron infusions. No stone events since last visit. NO significant LUTS. She underwent MRI  10/10/2019 which showed bilateral hydronephrosis   PMH: Past Medical History:  Diagnosis Date  . Bronchitis, chronic ([place])   . Chronic back pain   . Chronic kidney disease    kidney stone s/p stent placement ( removed)  . Complication of anesthesia   . Compression fracture of L3 lumbar vertebra   . Diverticulosis   . GERD (gastroesophageal reflux disease)   . Hemorrhoids   . Hiatal hernia   . History of kidney stones   . Hx of adenomatous colonic polyps   . Hyperlipidemia   . Hypertension   . Lumbar radicular pain   . Osteoporosis 12/10/2017  . PONV (postoperative nausea and vomiting)   . Smoldering myeloma ([place])   . Sore throat   . T7 vertebral fracture [facility])     Surgical History: Past Surgical History:  Procedure Laterality Date  . BACK SURGERY    . BONE MARROW ASPIRATION Left 02/2014  . BONE MARROW BIOPSY Left 02/2014  . CHOLECYSTECTOMY    . COLONOSCOPY  12/18/2011   Procedure: COLONOSCOPY;  Surgeon: Lafayette Dragon, MD;  Location: [facility];  Service: Endoscopy;  Laterality: N/A;  . COLONOSCOPY WITH PROPOFOL N/A 08/03/2017   Procedure: COLONOSCOPY WITH PROPOFOL;  Surgeon: Rogene Houston, MD;  Location: [facility];  Service: Endoscopy;  Laterality: N/A;  . CYSTOSCOPY/RETROGRADE/URETEROSCOPY/STONE EXTRACTION WITH BASKET Bilateral 04/18/2018   Procedure: CYSTOSCOPY/BILATERAL RETROGRADE/URETEROSCOPY/STONE EXTRACTION WITH BASKET WITH RIGHT STENT PLACEMENT;  Surgeon: Cleon Gustin, MD;  Location: [facility];  Service: Urology;   Laterality: Bilateral;  1 HR  . ESOPHAGEAL DILATION N/A 08/03/2017   Procedure: ESOPHAGEAL DILATION;  Surgeon: Rogene Houston, MD;  [facility];  Service: Endoscopy;  Laterality: N/A;  . ESOPHAGOGASTRODUODENOSCOPY  12/18/2011   Procedure: ESOPHAGOGASTRODUODENOSCOPY (EGD);  Surgeon: Lafayette Dragon, MD;  Location: [facility];  Service: Endoscopy;  Laterality: N/A;  . ESOPHAGOGASTRODUODENOSCOPY (EGD) WITH PROPOFOL N/A 08/03/2017   Procedure: ESOPHAGOGASTRODUODENOSCOPY (EGD) WITH PROPOFOL;  Surgeon: Rogene Houston, MD;  [facility];  Service: Endoscopy;  Laterality: N/A;  pt knows to arrive at 6:15  . HERNIA REPAIR    . HOLMIUM LASER APPLICATION Right 3/53/2992   Procedure: HOLMIUM LASER APPLICATION;  Surgeon: Cleon Gustin, MD;  Location: [facility];  Service: Urology;  Laterality: Right;  . LUMBAR LAMINECTOMY FOR EPIDURAL ABSCESS Left 09/22/2013   Procedure: LUMBAR LAMINECTOMY FOR EPIDURAL ABSCESS LEFT LUMBAR FIVE-SACRAL ONE;  Surgeon: Charlie Pitter, MD;  Location: [place] [facility]URO ORS;  Service: Neurosurgery;  Laterality: Left;  . RADIOLOGY WITH ANESTHESIA N/A 08/13/2014   Procedure: RADIOLOGY WITH ANESTHESIA ;  Surgeon: Medication Radiologist, MD;  Location: [place] [facility]URO ORS;  Service: Radiology;  Laterality: N/A;  . URETERAL STENT PLACEMENT    . VERTEBROPLASTY N/A 07/14/2013   Procedure: VERTEBROPLASTY WITH LUMBAR THREE BIOPSY;  Surgeon: Charlie Pitter, MD;  Location: [place] NEURO ORS;  Service: Neurosurgery;  Laterality: N/A;  VERTEBROPLASTY WITH LUMBAR THREE BIOPSY    Home Medications:  Allergies as of 11/20/2019      Reactions  Chlorhexidine Gluconate Itching   Pt c/o itching after abd wipes.    Morphine And Related Nausea And Vomiting   Headache   Tetracycline Nausea Only      Medication List       Accurate as of November 20, 2019  9:58 AM. If you have any questions, ask your nurse or doctor.        ergocalciferol 1.25 MG (50000 UT) capsule Commonly known as: VITAMIN  D2 Take 1 capsule (50,000 Units total) by mouth once a week.   HYDROcodone-acetaminophen 5-325 MG tablet Commonly known as: NORCO/VICODIN   ibuprofen 200 MG tablet Commonly known as: ADVIL Take 2 tablets (400 mg total) by mouth daily as needed for mild pain.   omeprazole 20 MG capsule Commonly known as: PRILOSEC Take 20 mg by mouth 2 (two) times daily before a meal.   ondansetron 4 MG disintegrating tablet Commonly known as: ZOFRAN-ODT ondansetron 4 mg disintegrating tablet  TAKE 1 TABLET BY MOUTH EVERY 6 HOURS AS NEEDED FOR NAUSEA   OxyCONTIN 20 mg 12 hr tablet Generic drug: oxyCODONE Take 20 mg by mouth daily.   THERATEARS OP Place 1 drop into both eyes daily as needed (dry eyes).       Allergies:  Allergies  Allergen Reactions  . Chlorhexidine Gluconate Itching    Pt c/o itching after abd wipes.   . Morphine And Related Nausea And Vomiting    Headache  . Tetracycline Nausea Only    Family History: Family History  Problem Relation Age of Onset  . Breast cancer Sister 41  . Breast cancer Other 30       aunt    Social History:  reports that she has never smoked. She has never used smokeless tobacco. She reports current alcohol use. She reports that she does not use drugs.  ROS: All other review of systems were reviewed and are negative except what is noted above in HPI  Physical Exam: BP (!) 158/64   Pulse 64   Temp 97.7 F (36.5 C)   Ht '5\' 1"'  (1.549 m)   Wt 151 lb 3.2 oz (68.6 kg)   BMI 28.57 kg/m   Constitutional:  Alert and oriented, No acute distress. HEENT: [place] AT, moist mucus membranes.  Trachea midline, no masses. Cardiovascular: No clubbing, cyanosis, or edema. Respiratory: Normal respiratory effort, no increased work of breathing. GI: Abdomen is soft, nontender, nondistended, no abdominal masses GU: No CVA tenderness.  Lymph: No cervical or inguinal lymphadenopathy. Skin: No rashes, bruises or suspicious lesions. Neurologic: Grossly intact,  no focal deficits, moving all 4 extremities. Psychiatric: Normal mood and affect.  Laboratory Data: Lab Results  Component Value Date   WBC 6.8 06/12/2019   HGB 12.5 06/12/2019   HCT 39.9 06/12/2019   MCV 86.9 06/12/2019   PLT 317 06/12/2019    Lab Results  Component Value Date   CREATININE 0.77 06/12/2019    No results found for: PSA  No results found for: TESTOSTERONE  No results found for: HGBA1C  Urinalysis    Component Value Date/Time   COLORURINE YELLOW 08/13/2014 0850   APPEARANCEUR Clear 11/20/2019 0919   LABSPEC 1.017 08/13/2014 0850   PHURINE 6.5 08/13/2014 0850   GLUCOSEU Negative 11/20/2019 0919   HGBUR NEGATIVE 08/13/2014 0850   BILIRUBINUR Negative 11/20/2019 0919   KETONESUR >80 (A) 08/13/2014 0850   PROTEINUR 1+ (A) 11/20/2019 0919   PROTEINUR NEGATIVE 08/13/2014 0850   UROBILINOGEN 0.2 08/13/2014 0850   NITRITE Negative 11/20/2019 0919  NITRITE NEGATIVE 08/13/2014 0850   LEUKOCYTESUR 1+ (A) 11/20/2019 0919    Lab Results  Component Value Date   LABMICR See below: 11/20/2019   WBCUA 11-30 (A) 11/20/2019   LABEPIT 0-10 11/20/2019   BACTERIA Few (A) 11/20/2019    Pertinent Imaging: MRI 10/10/2019: Images reviewed and discussed with the patient No results found for this or any previous visit.  Results for orders placed during the hospital encounter of 12/18/13  US Venous Img Lower Bilateral  Narrative CLINICAL DATA:  Leg swelling  EXAM: BILATERAL LOWER EXTREMITY VENOUS DOPPLER ULTRASOUND  TECHNIQUE: Gray-scale sonography with graded compression, as well as color Doppler and duplex ultrasound were performed to evaluate the lower extremity deep venous systems from the level of the common femoral vein and including the common femoral, femoral, profunda femoral, popliteal and calf veins including the posterior tibial, peroneal and gastrocnemius veins when visible. The superficial great saphenous vein was also interrogated. Spectral  Doppler was utilized to evaluate flow at rest and with distal augmentation maneuvers in the common femoral, femoral and popliteal veins.  COMPARISON:  None.  FINDINGS: RIGHT LOWER EXTREMITY  Common Femoral Vein: No evidence of thrombus. Normal compressibility, respiratory phasicity and response to augmentation.  Saphenofemoral Junction: No evidence of thrombus. Normal compressibility and flow on color Doppler imaging.  Profunda Femoral Vein: No evidence of thrombus. Normal compressibility and flow on color Doppler imaging.  Femoral Vein: No evidence of thrombus. Normal compressibility, respiratory phasicity and response to augmentation.  Popliteal Vein: No evidence of thrombus. Normal compressibility, respiratory phasicity and response to augmentation.  Calf Veins: No evidence of thrombus. Normal compressibility and flow on color Doppler imaging.  Superficial Great Saphenous Vein: No evidence of thrombus. Normal compressibility and flow on color Doppler imaging.  Venous Reflux:  None.  Other Findings:  None.  LEFT LOWER EXTREMITY  Common Femoral Vein: No evidence of thrombus. Normal compressibility, respiratory phasicity and response to augmentation.  Saphenofemoral Junction: No evidence of thrombus. Normal compressibility and flow on color Doppler imaging.  Profunda Femoral Vein: No evidence of thrombus. Normal compressibility and flow on color Doppler imaging.  Femoral Vein: No evidence of thrombus. Normal compressibility, respiratory phasicity and response to augmentation.  Popliteal Vein: No evidence of thrombus. Normal compressibility, respiratory phasicity and response to augmentation.  Calf Veins: Not well visualized.  Superficial Great Saphenous Vein: No evidence of thrombus. Normal compressibility and flow on color Doppler imaging.  Venous Reflux:  None.  Other Findings:  None.  IMPRESSION: No evidence of deep venous  thrombosis.   Electronically Signed By: Kathreen Devoid On: 12/18/2013 13:37  No results found for this or any previous visit.  No results found for this or any previous visit.  Results for orders placed during the hospital encounter of 02/24/19  US RENAL  Narrative CLINICAL DATA:  Renal calculus  EXAM: RENAL / URINARY TRACT ULTRASOUND COMPLETE  COMPARISON:  CT dated August 10, 2018.  May 17, 2018  FINDINGS: Right Kidney:  Renal measurements: 11.7 x 5.9 x 6.1 cm = volume: 218 mL. There is mild-to-moderate right-sided hydronephrosis. There is a 0.8 cm stone in the interpolar region. There is a 1.7 cm stone in the upper pole.  Left Kidney:  Renal measurements: 12 x 6.3 x 6.8 cm = volume: 265 mL. There is moderate hydronephrosis. There is a 0.9 cm stone in the upper pole.  Bladder:  The bladder was underdistended and therefore poorly evaluated.  Other:  None.  IMPRESSION: 1. Mild-to-moderate bilateral hydronephrosis.  This appears to have worsened bilaterally since the patient's prior ultrasound dated May 02, 2018. 2. Bilateral renal nephroliths as detailed above. 3. The urinary bladder is poorly evaluated secondary to underdistention.   Electronically Signed By: Constance Holster M.D. On: 02/24/2019 16:15  No results found for this or any previous visit.  No results found for this or any previous visit.  Results for orders placed during the hospital encounter of 02/26/19  CT RENAL STONE STUDY  Narrative CLINICAL DATA:  Right lower quadrant and right flank pain. History of nephrolithiasis. Prior cholecystectomy.  EXAM: CT ABDOMEN AND PELVIS WITHOUT CONTRAST  TECHNIQUE: Multidetector CT imaging of the abdomen and pelvis was performed following the standard protocol without IV contrast.  COMPARISON:  02/24/2019 renal sonogram. 04/17/2018 unenhanced CT abdomen/pelvis.  FINDINGS: Lower chest: Mild right middle lobe and lingular and moderate  left lower lobe cylindrical bronchiectasis with associated thick bandlike scarring, similar to slightly worsened in the interval. Right lower lobe 3 mm solid pulmonary nodule (series 4/image 3), stable, considered benign.  Hepatobiliary: Normal liver size. Simple 2.5 cm posterior left liver lobe cyst. No additional liver lesions. Punctate granulomatous right liver dome calcification is unchanged. Cholecystectomy. Stable dilated CBD, 8 mm diameter, within normal post cholecystectomy limits, without significant intrahepatic biliary ductal dilatation.  Pancreas: Normal, with no mass or duct dilation.  Spleen: Normal size. No mass.  Adrenals/Urinary Tract: Normal adrenals. Nonobstructing 2 mm upper, 3 mm interpolar and 3 mm lower right renal stones. Nonobstructing 1 mm upper left renal stones. Subcentimeter hypodense renal cortical lesion in the medial upper right kidney is too small to characterize and is unchanged, considered benign. No contour deforming renal masses. Mild right hydronephrosis with moderately dilated right extrarenal pelvis with somewhat abrupt caliber transition at the right ureteropelvic junction, increased since 08/10/2018 CT. Moderate left hydronephrosis with abrupt caliber transition at the left ureteropelvic junction, mildly increased since 08/10/2018 CT. Normal caliber ureters. No UPJ or ureteral stones. Normal bladder, with no bladder stones.  Stomach/Bowel: Curvilinear hyperdensity adjacent to the esophagogastric junction. Stomach is nondistended with no acute gastric abnormality. Normal caliber small bowel with no small bowel wall thickening. Normal appendix. Normal large bowel with no diverticulosis, large bowel wall thickening or pericolonic fat stranding.  Vascular/Lymphatic: Atherosclerotic nonaneurysmal abdominal aorta. There is worsening fat stranding throughout the retroperitoneum surrounding the IVC and abdominal aorta and proximal  ureters bilaterally. No pathologically enlarged lymph nodes in the abdomen or pelvis.  Reproductive: Grossly normal uterus. No adnexal mass. Bilateral tubal ligation clips in place.  Other: No pneumoperitoneum, ascites or focal fluid collection.  Musculoskeletal: No aggressive appearing focal osseous lesions. Severe degenerative disc disease at L3-4. Vertebroplasty material again noted within mild-to-moderate L3 vertebral compression fracture. Prominent bilateral lower lumbar facet arthropathy with stable grade 1 anterolisthesis at L5-S1.  IMPRESSION: 1. 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 2. Mild right and moderate left hydronephrosis with abrupt caliber transitions at the UPJ bilaterally, without evidence of obstructing stone or mass on this noncontrast scan. Findings could represent progressive proximal ureteral obstruction due to retroperitoneal fibrosis. 3. Tiny nonobstructing stones in both kidneys. 4. Mild to moderate bronchiectasis and thick bandlike scarring at both lung bases, similar to slightly worsened. 5.  Aortic Atherosclerosis (ICD10-I70.0).   Electronically Signed By: Ilona Sorrel M.D. On: 02/26/2019 15:50   Assessment & Plan:    1. Nephrolithiasis -RTC 6 months with renal US - Urinalysis,  Routine w reflex microscopic  2. Retroperitoneal fibrosis -Referral to Dr. Alen Blew   No follow-ups on file.  [name], MD  [facility] [place]

## 2019-11-21 ENCOUNTER — Telehealth: Payer: Self-pay | Admitting: Oncology

## 2019-11-21 NOTE — Telephone Encounter (Signed)
Received a new pt referral from Dr. Alyson Ingles for Retroperitoneal fibrosis. Ms. Autumn Duran has been cld and scheduled to see Dr. Alen Blew on 10/20 at 11am. Pt aware to arrive 30 minutes early.

## 2019-12-11 DIAGNOSIS — M4626 Osteomyelitis of vertebra, lumbar region: Secondary | ICD-10-CM | POA: Diagnosis not present

## 2019-12-11 DIAGNOSIS — M961 Postlaminectomy syndrome, not elsewhere classified: Secondary | ICD-10-CM | POA: Diagnosis not present

## 2019-12-11 DIAGNOSIS — F1120 Opioid dependence, uncomplicated: Secondary | ICD-10-CM | POA: Diagnosis not present

## 2019-12-17 ENCOUNTER — Inpatient Hospital Stay: Payer: PPO | Attending: Oncology | Admitting: Oncology

## 2019-12-17 ENCOUNTER — Other Ambulatory Visit: Payer: Self-pay

## 2019-12-17 VITALS — BP 146/69 | HR 62 | Temp 97.5°F | Resp 18 | Ht 61.0 in | Wt 147.1 lb

## 2019-12-17 DIAGNOSIS — Z79899 Other long term (current) drug therapy: Secondary | ICD-10-CM | POA: Insufficient documentation

## 2019-12-17 DIAGNOSIS — Z803 Family history of malignant neoplasm of breast: Secondary | ICD-10-CM | POA: Diagnosis not present

## 2019-12-17 DIAGNOSIS — E785 Hyperlipidemia, unspecified: Secondary | ICD-10-CM | POA: Diagnosis not present

## 2019-12-17 DIAGNOSIS — I10 Essential (primary) hypertension: Secondary | ICD-10-CM | POA: Insufficient documentation

## 2019-12-17 DIAGNOSIS — D508 Other iron deficiency anemias: Secondary | ICD-10-CM

## 2019-12-17 DIAGNOSIS — K219 Gastro-esophageal reflux disease without esophagitis: Secondary | ICD-10-CM | POA: Diagnosis not present

## 2019-12-17 DIAGNOSIS — D509 Iron deficiency anemia, unspecified: Secondary | ICD-10-CM | POA: Insufficient documentation

## 2019-12-17 DIAGNOSIS — C9030 Solitary plasmacytoma not having achieved remission: Secondary | ICD-10-CM | POA: Diagnosis not present

## 2019-12-17 DIAGNOSIS — Z87442 Personal history of urinary calculi: Secondary | ICD-10-CM | POA: Insufficient documentation

## 2019-12-17 DIAGNOSIS — N189 Chronic kidney disease, unspecified: Secondary | ICD-10-CM | POA: Insufficient documentation

## 2019-12-17 DIAGNOSIS — N135 Crossing vessel and stricture of ureter without hydronephrosis: Secondary | ICD-10-CM | POA: Insufficient documentation

## 2019-12-17 NOTE — Progress Notes (Signed)
Reason for the request:    Retroperitoneal fibrosis  HPI: I was asked by Dr. Alyson Ingles to evaluate Autumn Duran for the evaluation of fibrosis.  He is a 69 year old woman with history of hypertension osteoporosis among other comorbid conditions.  She follows with Dr. Alyson Ingles for history of nephrolithiasis.  She underwent MRI in August 2021 related to flank pain bilaterally.  MRI at that time showed bilateral hydronephrosis.  Previous imaging studies including CT scan in December 2020 showed worsening fat stranding throughout the retroperitoneum concerning for progressive but retroperitoneal fibrosis.  She had mild right and moderate left hydronephrosis without any evidence of obstructing stones.  She was evaluated by Dr. Alyson Ingles recently referred to me for evaluation related to her arthrosis.  She did have history of iron deficiency anemia and has been treated with intravenous iron and was treated at [facility].  She was diagnosed with isolated plasmacytoma in 2015 and underwent kyphoplasty followed by radiation therapy.  Her multiple myeloma work-up was unrevealing including involvement of biopsy at that time.  Clinically, he reports feeling reasonably well without any major complaints.  He does have chronic back pain related to her history of kyphoplasty and osteomyelitis which she takes OxyContin for.  He does report intermittent right-sided flank pain that is different than her back pain.  For the most part she remains active and attends to activities of daily living without any decline.  She does not report any headaches, blurry vision, syncope or seizures. Does not report any fevers, chills or sweats.  Does not report any cough, wheezing or hemoptysis.  Does not report any chest pain, palpitation, orthopnea or leg edema.  Does not report any nausea, vomiting or abdominal pain.  Does not report any constipation or diarrhea.  Does not report any skeletal complaints.    Does not report  frequency, urgency or hematuria.  Does not report any skin rashes or lesions. Does not report any heat or cold intolerance.  Does not report any lymphadenopathy or petechiae.  Does not report any anxiety or depression.  Remaining review of systems is negative.    Past Medical History:  Diagnosis Date  . Bronchitis, chronic ([place])   . Chronic back pain   . Chronic kidney disease    kidney stone s/p stent placement ( removed)  . Complication of anesthesia   . Compression fracture of L3 lumbar vertebra   . Diverticulosis   . GERD (gastroesophageal reflux disease)   . Hemorrhoids   . Hiatal hernia   . History of kidney stones   . Hx of adenomatous colonic polyps   . Hyperlipidemia   . Hypertension   . Lumbar radicular pain   . Osteoporosis 12/10/2017  . PONV (postoperative nausea and vomiting)   . Smoldering myeloma ([place])   . Sore throat   . T7 vertebral fracture ([facility])   :  Past Surgical History:  Procedure Laterality Date  . BACK SURGERY    . BONE MARROW ASPIRATION Left 02/2014  . BONE MARROW BIOPSY Left 02/2014  . CHOLECYSTECTOMY    . COLONOSCOPY  12/18/2011   Procedure: COLONOSCOPY;  Surgeon: Lafayette Dragon, MD;  Location: [facility];  Service: Endoscopy;  Laterality: N/A;  . COLONOSCOPY WITH PROPOFOL N/A 08/03/2017   Procedure: COLONOSCOPY WITH PROPOFOL;  Surgeon: Rogene Houston, MD;  Location: [facility];  Service: Endoscopy;  Laterality: N/A;  . CYSTOSCOPY/RETROGRADE/URETEROSCOPY/STONE EXTRACTION WITH BASKET Bilateral 04/18/2018   Procedure: CYSTOSCOPY/BILATERAL RETROGRADE/URETEROSCOPY/STONE EXTRACTION WITH BASKET WITH RIGHT STENT  PLACEMENT;  Surgeon: Cleon Gustin, MD;  Location: [facility];  Service: Urology;  Laterality: Bilateral;  1 HR  . ESOPHAGEAL DILATION N/A 08/03/2017   Procedure: ESOPHAGEAL DILATION;  Surgeon: Rogene Houston, MD;  [facility];  Service: Endoscopy;  Laterality: N/A;  . ESOPHAGOGASTRODUODENOSCOPY  12/18/2011   Procedure:  ESOPHAGOGASTRODUODENOSCOPY (EGD);  Surgeon: Lafayette Dragon, MD;  Location: [facility];  Service: Endoscopy;  Laterality: N/A;  . ESOPHAGOGASTRODUODENOSCOPY (EGD) WITH PROPOFOL N/A 08/03/2017   Procedure: ESOPHAGOGASTRODUODENOSCOPY (EGD) WITH PROPOFOL;  Surgeon: Rogene Houston, MD;  [facility];  Service: Endoscopy;  Laterality: N/A;  pt knows to arrive at 6:15  . HERNIA REPAIR    . HOLMIUM LASER APPLICATION Right 0/11/2723   Procedure: HOLMIUM LASER APPLICATION;  Surgeon: Cleon Gustin, MD;  Location: [facility];  Service: Urology;  Laterality: Right;  . LUMBAR LAMINECTOMY FOR EPIDURAL ABSCESS Left 09/22/2013   Procedure: LUMBAR LAMINECTOMY FOR EPIDURAL ABSCESS LEFT LUMBAR FIVE-SACRAL ONE;  Surgeon: Charlie Pitter, MD;  Location: [facility];  Service: Neurosurgery;  Laterality: Left;  . RADIOLOGY WITH ANESTHESIA N/A 08/13/2014   Procedure: RADIOLOGY WITH ANESTHESIA ;  Surgeon: Medication Radiologist, MD;  Location: [facility];  Service: Radiology;  Laterality: N/A;  . URETERAL STENT PLACEMENT    . VERTEBROPLASTY N/A 07/14/2013   Procedure: VERTEBROPLASTY WITH LUMBAR THREE BIOPSY;  Surgeon: Charlie Pitter, MD;  Location: [facility];  Service: Neurosurgery;  Laterality: N/A;  VERTEBROPLASTY WITH LUMBAR THREE BIOPSY  :   Current Outpatient Medications:  .  Carboxymethylcellulose Sodium (THERATEARS OP), Place 1 drop into both eyes daily as needed (dry eyes). (Patient not taking: Reported on 11/20/2019), Disp: , Rfl:  .  ergocalciferol (VITAMIN D2) 1.25 MG (50000 UT) capsule, Take 1 capsule (50,000 Units total) by mouth once a week., Disp: 16 capsule, Rfl: 3 .  HYDROcodone-acetaminophen (NORCO/VICODIN) 5-325 MG tablet, , Disp: , Rfl:  .  ibuprofen (ADVIL,MOTRIN) 200 MG tablet, Take 2 tablets (400 mg total) by mouth daily as needed for mild pain., Disp: 30 tablet, Rfl: 0 .  omeprazole (PRILOSEC) 20 MG capsule, Take 20 mg by mouth 2 (two) times daily before a meal., Disp: , Rfl:  .   ondansetron (ZOFRAN-ODT) 4 MG disintegrating tablet, ondansetron 4 mg disintegrating tablet  TAKE 1 TABLET BY MOUTH EVERY 6 HOURS AS NEEDED FOR NAUSEA, Disp: , Rfl:  .  oxyCODONE (OXYCONTIN) 20 mg 12 hr tablet, Take 20 mg by mouth daily. , Disp: , Rfl: :  Allergies  Allergen Reactions  . Chlorhexidine Gluconate Itching    Pt c/o itching after abd wipes.   . Morphine And Related Nausea And Vomiting    Headache  . Tetracycline Nausea Only  :  Family History  Problem Relation Age of Onset  . Breast cancer Sister 56  . Breast cancer Other 67       aunt  :  Social History   Socioeconomic History  . Marital status: Divorced    Spouse name: Not on file  . Number of children: Not on file  . Years of education: Not on file  . Highest education level: Not on file  Occupational History  . Occupation: Armed forces operational officer: [employer]    Comment: [employer]  Tobacco Use  . Smoking status: Never Smoker  . Smokeless tobacco: Never Used  Vaping Use  . Vaping Use: Never used  Substance and Sexual Activity  . Alcohol use: Yes  Alcohol/week: 0.0 standard drinks    Comment: social occ  . Drug use: No  . Sexual activity: Yes    Birth control/protection: None  Other Topics Concern  . Not on file  Social History Narrative  . Not on file   Social Determinants of Health   Financial Resource Strain:   . Difficulty of Paying Living Expenses: Not on file  Food Insecurity:   . Worried About Charity fundraiser in the Last Year: Not on file  . Ran Out of Food in the Last Year: Not on file  Transportation Needs:   . Lack of Transportation (Medical): Not on file  . Lack of Transportation (Non-Medical): Not on file  Physical Activity:   . Days of Exercise per Week: Not on file  . Minutes of Exercise per Session: Not on file  Stress:   . Feeling of Stress : Not on file  Social Connections:   . Frequency of Communication with Friends and Family: Not on file  . Frequency of Social  Gatherings with Friends and Family: Not on file  . Attends Religious Services: Not on file  . Active Member of Clubs or Organizations: Not on file  . Attends Archivist Meetings: Not on file  . Marital Status: Not on file  Intimate Partner Violence:   . Fear of Current or Ex-Partner: Not on file  . Emotionally Abused: Not on file  . Physically Abused: Not on file  . Sexually Abused: Not on file  :  Pertinent items are noted in HPI. Blood pressure (!) 146/69, pulse 62, temperature (!) 97.5 F (36.4 C), temperature source Tympanic, resp. rate 18, height '5\' 1"'  (1.549 m), weight 147 lb 1.6 oz (66.7 kg), SpO2 96 %.   Exam: 0  General appearance: alert and cooperative appeared without distress. Head: atraumatic without any abnormalities. Eyes: conjunctivae/corneas clear. PERRL.  Sclera anicteric. Throat: lips, mucosa, and tongue normal; without oral thrush or ulcers. Resp: clear to auscultation bilaterally without rhonchi, wheezes or dullness to percussion. Cardio: regular rate and rhythm, S1, S2 normal, no murmur, click, rub or gallop GI: soft, non-tender; bowel sounds normal; no masses,  no organomegaly Skin: Skin color, texture, turgor normal. No rashes or lesions Lymph nodes: Cervical, supraclavicular, and axillary nodes normal. Neurologic: Grossly normal without any motor, sensory or deep tendon reflexes. Musculoskeletal: No joint deformity or effusion.    Assessment and Plan:   69 year old woman with:  1.  Retroperitoneal fibrosis noted in December 2020 based on imaging studies including CT scan at that time.  MRI in August 2021 did not show any evidence of malignancy in the spine.  She does have elements of her bilateral hydronephrosis currently following with Dr. Alyson Ingles.  The natural course of this disease was reviewed differential diagnosis was discussed.  This is a not a malignancy related condition with no evidence to suggest any cancer at this point.  She had  multiple imaging studies and up to date on her age-appropriate cancer screening.  This appears to be possibly idiopathic or could be related to her previous radiation therapy.  The management of this condition falls outside the realm of oncology at this point given the fact that this is not a neoplastic process.  Management can range from a supportive care, prednisone to immunosuppressive therapy under the care of a rheumatologist.  I have recommended continuing following with Dr. Alyson Ingles regarding her hydronephrosis and if her fibrosis continues to get worse, and a referral to rheumatology may  be recommended.  2.  Iron deficiency anemia: He has received intravenous iron in the past.  And if she needs that in the future she can follow-up at [place].  3. History of plasmacytoma: No evidence of multiple myeloma at this time.  If oncology follow-up is needed, I recommended she would be evaluated at [facility] where she has been treated previously.  45  minutes were dedicated to this visit. The time was spent on reviewing laboratory data, imaging studies, discussing treatment options, discussing differential diagnosis and answering questions regarding future plan.     A copy of this consult has been forwarded to the requesting physician.

## 2019-12-22 DIAGNOSIS — J069 Acute upper respiratory infection, unspecified: Secondary | ICD-10-CM | POA: Diagnosis not present

## 2019-12-22 DIAGNOSIS — Z299 Encounter for prophylactic measures, unspecified: Secondary | ICD-10-CM | POA: Diagnosis not present

## 2020-01-15 DIAGNOSIS — F419 Anxiety disorder, unspecified: Secondary | ICD-10-CM | POA: Diagnosis not present

## 2020-01-15 DIAGNOSIS — E785 Hyperlipidemia, unspecified: Secondary | ICD-10-CM | POA: Diagnosis not present

## 2020-01-15 DIAGNOSIS — J019 Acute sinusitis, unspecified: Secondary | ICD-10-CM | POA: Diagnosis not present

## 2020-01-15 DIAGNOSIS — I10 Essential (primary) hypertension: Secondary | ICD-10-CM | POA: Diagnosis not present

## 2020-01-15 DIAGNOSIS — Z299 Encounter for prophylactic measures, unspecified: Secondary | ICD-10-CM | POA: Diagnosis not present

## 2020-01-15 DIAGNOSIS — Z789 Other specified health status: Secondary | ICD-10-CM | POA: Diagnosis not present

## 2020-02-16 ENCOUNTER — Ambulatory Visit (HOSPITAL_COMMUNITY)
Admission: RE | Admit: 2020-02-16 | Discharge: 2020-02-16 | Disposition: A | Discharge status: Home / Self Care | Payer: PPO | Source: Ambulatory Visit | Attending: Urology | Admitting: Urology

## 2020-02-16 ENCOUNTER — Other Ambulatory Visit: Payer: Self-pay

## 2020-02-16 DIAGNOSIS — N200 Calculus of kidney: Secondary | ICD-10-CM | POA: Insufficient documentation

## 2020-02-16 DIAGNOSIS — N1330 Unspecified hydronephrosis: Secondary | ICD-10-CM | POA: Diagnosis not present

## 2020-02-16 DIAGNOSIS — N135 Crossing vessel and stricture of ureter without hydronephrosis: Secondary | ICD-10-CM | POA: Insufficient documentation

## 2020-02-16 DIAGNOSIS — K682 Retroperitoneal fibrosis: Secondary | ICD-10-CM

## 2020-02-19 ENCOUNTER — Other Ambulatory Visit: Payer: Self-pay

## 2020-02-19 ENCOUNTER — Encounter: Payer: Self-pay | Admitting: Urology

## 2020-02-19 ENCOUNTER — Ambulatory Visit (INDEPENDENT_AMBULATORY_CARE_PROVIDER_SITE_OTHER): Payer: PPO | Admitting: Urology

## 2020-02-19 VITALS — BP 136/70 | HR 66 | Temp 98.4°F | Ht 60.0 in | Wt 158.4 lb

## 2020-02-19 DIAGNOSIS — N200 Calculus of kidney: Secondary | ICD-10-CM | POA: Diagnosis not present

## 2020-02-19 DIAGNOSIS — N135 Crossing vessel and stricture of ureter without hydronephrosis: Secondary | ICD-10-CM

## 2020-02-19 DIAGNOSIS — K682 Retroperitoneal fibrosis: Secondary | ICD-10-CM

## 2020-02-19 LAB — MICROSCOPIC EXAMINATION: Renal Epithel, UA: NONE SEEN /hpf

## 2020-02-19 LAB — URINALYSIS, ROUTINE W REFLEX MICROSCOPIC
Bilirubin, UA: NEGATIVE
Glucose, UA: NEGATIVE
Ketones, UA: NEGATIVE
Nitrite, UA: NEGATIVE
Protein,UA: NEGATIVE
Specific Gravity, UA: 1.02 (ref 1.005–1.030)
Urobilinogen, Ur: 0.2 mg/dL (ref 0.2–1.0)
pH, UA: 7 (ref 5.0–7.5)

## 2020-02-19 NOTE — Progress Notes (Signed)
02/19/2020 10:09 AM   Autumn Duran Mar 24, 1950 [id number]  Referring provider: [name], MD [place],  [place] [address]  followup retroperitoneal fibrosis and nephrolithiasis  HPI: Autumn Duran is a 29HB here for followup for nephrolithiasis and retroperitoneal fibrosis. She denies any flank pain. No stone events since last visit. She saw Dr. Alen Blew who recommended rheumatology if here RPF worsens. Renal US from 12/20 shows worsening bilateral hydronephrosis and bilateral renal calculi. No significant LUTS   PMH: Past Medical History:  Diagnosis Date  . Bronchitis, chronic ([place])   . Chronic back pain   . Chronic kidney disease    kidney stone s/p stent placement ( removed)  . Complication of anesthesia   . Compression fracture of L3 lumbar vertebra   . Diverticulosis   . GERD (gastroesophageal reflux disease)   . Hemorrhoids   . Hiatal hernia   . History of kidney stones   . Hx of adenomatous colonic polyps   . Hyperlipidemia   . Hypertension   . Lumbar radicular pain   . Osteoporosis 12/10/2017  . PONV (postoperative nausea and vomiting)   . Smoldering myeloma ([place])   . Sore throat   . T7 vertebral fracture [facility])     Surgical History: Past Surgical History:  Procedure Laterality Date  . BACK SURGERY    . BONE MARROW ASPIRATION Left 02/2014  . BONE MARROW BIOPSY Left 02/2014  . CHOLECYSTECTOMY    . COLONOSCOPY  12/18/2011   Procedure: COLONOSCOPY;  Surgeon: Lafayette Dragon, MD;  Location: [facility];  Service: Endoscopy;  Laterality: N/A;  . COLONOSCOPY WITH PROPOFOL N/A 08/03/2017   Procedure: COLONOSCOPY WITH PROPOFOL;  Surgeon: Rogene Houston, MD;  Location: [facility];  Service: Endoscopy;  Laterality: N/A;  . CYSTOSCOPY/RETROGRADE/URETEROSCOPY/STONE EXTRACTION WITH BASKET Bilateral 04/18/2018   Procedure: CYSTOSCOPY/BILATERAL RETROGRADE/URETEROSCOPY/STONE EXTRACTION WITH BASKET WITH RIGHT STENT PLACEMENT;  Surgeon: Cleon Gustin, MD;  Location:  [facility] ORS;  Service: Urology;  Laterality: Bilateral;  1 HR  . ESOPHAGEAL DILATION N/A 08/03/2017   Procedure: ESOPHAGEAL DILATION;  Surgeon: Rogene Houston, MD;  [facility]: [facility];  Service: Endoscopy;  Laterality: N/A;  . ESOPHAGOGASTRODUODENOSCOPY  12/18/2011   Procedure: ESOPHAGOGASTRODUODENOSCOPY (EGD);  Surgeon: Lafayette Dragon, MD;  Location: [facility];  Service: Endoscopy;  Laterality: N/A;  . ESOPHAGOGASTRODUODENOSCOPY (EGD) WITH PROPOFOL N/A 08/03/2017   Procedure: ESOPHAGOGASTRODUODENOSCOPY (EGD) WITH PROPOFOL;  Surgeon: Rogene Houston, MD;  [facility];  Service: Endoscopy;  Laterality: N/A;  pt knows to arrive at 6:15  . HERNIA REPAIR    . HOLMIUM LASER APPLICATION Right 09/11/9676   Procedure: HOLMIUM LASER APPLICATION;  Surgeon: Cleon Gustin, MD;  Location: [facility];  Service: Urology;  Laterality: Right;  . LUMBAR LAMINECTOMY FOR EPIDURAL ABSCESS Left 09/22/2013   Procedure: LUMBAR LAMINECTOMY FOR EPIDURAL ABSCESS LEFT LUMBAR FIVE-SACRAL ONE;  Surgeon: Charlie Pitter, MD;  Location: [place] [facility] ORS;  Service: Neurosurgery;  Laterality: Left;  . RADIOLOGY WITH ANESTHESIA N/A 08/13/2014   Procedure: RADIOLOGY WITH ANESTHESIA ;  Surgeon: Medication Radiologist, MD;  Location: [place] [facility] ORS;  Service: Radiology;  Laterality: N/A;  . URETERAL STENT PLACEMENT    . VERTEBROPLASTY N/A 07/14/2013   Procedure: VERTEBROPLASTY WITH LUMBAR THREE BIOPSY;  Surgeon: Charlie Pitter, MD;  Location: [facility] ORS;  Service: Neurosurgery;  Laterality: N/A;  VERTEBROPLASTY WITH LUMBAR THREE BIOPSY    Home Medications:  Allergies as of 02/19/2020      Reactions  Chlorhexidine Gluconate Itching   Pt c/o itching after abd wipes.    Morphine And Related Nausea And Vomiting   Headache   Tetracycline Nausea Only      Medication List       Accurate as of February 19, 2020 10:09 AM. If you have any questions, ask your nurse or doctor.        ergocalciferol 1.25 MG (50000 UT)  capsule Commonly known as: VITAMIN D2 Take 1 capsule (50,000 Units total) by mouth once a week.   HYDROcodone-acetaminophen 5-325 MG tablet Commonly known as: NORCO/VICODIN   ibuprofen 200 MG tablet Commonly known as: ADVIL Take 2 tablets (400 mg total) by mouth daily as needed for mild pain.   omeprazole 20 MG capsule Commonly known as: PRILOSEC Take 20 mg by mouth 2 (two) times daily before a meal.   ondansetron 4 MG disintegrating tablet Commonly known as: ZOFRAN-ODT ondansetron 4 mg disintegrating tablet  TAKE 1 TABLET BY MOUTH EVERY 6 HOURS AS NEEDED FOR NAUSEA   OxyCONTIN 20 mg 12 hr tablet Generic drug: oxyCODONE Take 20 mg by mouth daily.   THERATEARS OP Place 1 drop into both eyes daily as needed (dry eyes).       Allergies:  Allergies  Allergen Reactions  . Chlorhexidine Gluconate Itching    Pt c/o itching after abd wipes.   . Morphine And Related Nausea And Vomiting    Headache  . Tetracycline Nausea Only    Family History: Family History  Problem Relation Age of Onset  . Breast cancer Sister 91  . Breast cancer Other 19       aunt    Social History:  reports that she has never smoked. She has never used smokeless tobacco. She reports current alcohol use. She reports that she does not use drugs.  ROS: All other review of systems were reviewed and are negative except what is noted above in HPI  Physical Exam: BP 136/70   Pulse 66   Temp 98.4 F (36.9 C)   Ht 5' (1.524 m)   Wt 158 lb 6.4 oz (71.8 kg)   BMI 30.94 kg/m   Constitutional:  Alert and oriented, No acute distress. HEENT:  AT, moist mucus membranes.  Trachea midline, no masses. Cardiovascular: No clubbing, cyanosis, or edema. Respiratory: Normal respiratory effort, no increased work of breathing. GI: Abdomen is soft, nontender, nondistended, no abdominal masses GU: No CVA tenderness.  Lymph: No cervical or inguinal lymphadenopathy. Skin: No rashes, bruises or suspicious  lesions. Neurologic: Grossly intact, no focal deficits, moving all 4 extremities. Psychiatric: Normal mood and affect.  Laboratory Data: Lab Results  Component Value Date   WBC 6.8 06/12/2019   HGB 12.5 06/12/2019   HCT 39.9 06/12/2019   MCV 86.9 06/12/2019   PLT 317 06/12/2019    Lab Results  Component Value Date   CREATININE 0.77 06/12/2019    No results found for: PSA  No results found for: TESTOSTERONE  No results found for: HGBA1C  Urinalysis    Component Value Date/Time   COLORURINE YELLOW 08/13/2014 0850   APPEARANCEUR Clear 11/20/2019 0919   LABSPEC 1.017 08/13/2014 0850   PHURINE 6.5 08/13/2014 0850   GLUCOSEU Negative 11/20/2019 0919   HGBUR NEGATIVE 08/13/2014 0850   BILIRUBINUR Negative 11/20/2019 0919   KETONESUR >80 (A) 08/13/2014 0850   PROTEINUR 1+ (A) 11/20/2019 0919   PROTEINUR NEGATIVE 08/13/2014 0850   UROBILINOGEN 0.2 08/13/2014 0850   NITRITE Negative 11/20/2019 0919   NITRITE  NEGATIVE 08/13/2014 0850   LEUKOCYTESUR 1+ (A) 11/20/2019 0919    Lab Results  Component Value Date   LABMICR See below: 11/20/2019   WBCUA 11-30 (A) 11/20/2019   LABEPIT 0-10 11/20/2019   BACTERIA Few (A) 11/20/2019    Pertinent Imaging: Renal US 02/16/2020: Images reviewed and discussed with the patient No results found for this or any previous visit.  Results for orders placed during the hospital encounter of 12/18/13  US Venous Img Lower Bilateral  Narrative CLINICAL DATA:  Leg swelling  EXAM: BILATERAL LOWER EXTREMITY VENOUS DOPPLER ULTRASOUND  TECHNIQUE: Gray-scale sonography with graded compression, as well as color Doppler and duplex ultrasound were performed to evaluate the lower extremity deep venous systems from the level of the common femoral vein and including the common femoral, femoral, profunda femoral, popliteal and calf veins including the posterior tibial, peroneal and gastrocnemius veins when visible. The superficial  great saphenous vein was also interrogated. Spectral Doppler was utilized to evaluate flow at rest and with distal augmentation maneuvers in the common femoral, femoral and popliteal veins.  COMPARISON:  None.  FINDINGS: RIGHT LOWER EXTREMITY  Common Femoral Vein: No evidence of thrombus. Normal compressibility, respiratory phasicity and response to augmentation.  Saphenofemoral Junction: No evidence of thrombus. Normal compressibility and flow on color Doppler imaging.  Profunda Femoral Vein: No evidence of thrombus. Normal compressibility and flow on color Doppler imaging.  Femoral Vein: No evidence of thrombus. Normal compressibility, respiratory phasicity and response to augmentation.  Popliteal Vein: No evidence of thrombus. Normal compressibility, respiratory phasicity and response to augmentation.  Calf Veins: No evidence of thrombus. Normal compressibility and flow on color Doppler imaging.  Superficial Great Saphenous Vein: No evidence of thrombus. Normal compressibility and flow on color Doppler imaging.  Venous Reflux:  None.  Other Findings:  None.  LEFT LOWER EXTREMITY  Common Femoral Vein: No evidence of thrombus. Normal compressibility, respiratory phasicity and response to augmentation.  Saphenofemoral Junction: No evidence of thrombus. Normal compressibility and flow on color Doppler imaging.  Profunda Femoral Vein: No evidence of thrombus. Normal compressibility and flow on color Doppler imaging.  Femoral Vein: No evidence of thrombus. Normal compressibility, respiratory phasicity and response to augmentation.  Popliteal Vein: No evidence of thrombus. Normal compressibility, respiratory phasicity and response to augmentation.  Calf Veins: Not well visualized.  Superficial Great Saphenous Vein: No evidence of thrombus. Normal compressibility and flow on color Doppler imaging.  Venous Reflux:  None.  Other Findings:  None.  IMPRESSION: No  evidence of deep venous thrombosis.   Electronically Signed By: Kathreen Devoid On: 12/18/2013 13:37  No results found for this or any previous visit.  No results found for this or any previous visit.  Results for orders placed during the hospital encounter of 02/16/20  Ultrasound renal complete  Narrative CLINICAL DATA:  Retroperitoneal fibrosis/nephrolithiasis.  EXAM: RENAL / URINARY TRACT ULTRASOUND COMPLETE  COMPARISON:  Ultrasound 02/24/2019.  FINDINGS: Right Kidney:  Renal measurements: 13.1 x 5.4 x 7.3 cm = volume: 274 mL. Echogenicity within normal limits. Moderate hydronephrosis, progressed from prior. 1.2 cm calculus within the inferior pole.  Left Kidney:  Renal measurements: 12.2 x 7.3 x 6.1 cm = volume: 283 mL. Echogenicity within normal limits. Severe hydronephrosis, progressed from prior. 1.3 cm calculus within the superior pole.  Bladder:  Appears normal for degree of bladder distention. Bilateral ureteral jets are noted.  IMPRESSION: 1. Moderate right and severe left hydronephrosis, progressed from the prior from 02/24/2019. 2. Bilateral nephrolithiasis, as  detailed above.   Electronically Signed By: Margaretha Sheffield MD On: 02/16/2020 16:47  No results found for this or any previous visit.  No results found for this or any previous visit.  Results for orders placed during the hospital encounter of 02/26/19  CT RENAL STONE STUDY  Narrative CLINICAL DATA:  Right lower quadrant and right flank pain. History of nephrolithiasis. Prior cholecystectomy.  EXAM: CT ABDOMEN AND PELVIS WITHOUT CONTRAST  TECHNIQUE: Multidetector CT imaging of the abdomen and pelvis was performed following the standard protocol without IV contrast.  COMPARISON:  02/24/2019 renal sonogram. 04/17/2018 unenhanced CT abdomen/pelvis.  FINDINGS: Lower chest: Mild right middle lobe and lingular and moderate left lower lobe cylindrical bronchiectasis with  associated thick bandlike scarring, similar to slightly worsened in the interval. Right lower lobe 3 mm solid pulmonary nodule (series 4/image 3), stable, considered benign.  Hepatobiliary: Normal liver size. Simple 2.5 cm posterior left liver lobe cyst. No additional liver lesions. Punctate granulomatous right liver dome calcification is unchanged. Cholecystectomy. Stable dilated CBD, 8 mm diameter, within normal post cholecystectomy limits, without significant intrahepatic biliary ductal dilatation.  Pancreas: Normal, with no mass or duct dilation.  Spleen: Normal size. No mass.  Adrenals/Urinary Tract: Normal adrenals. Nonobstructing 2 mm upper, 3 mm interpolar and 3 mm lower right renal stones. Nonobstructing 1 mm upper left renal stones. Subcentimeter hypodense renal cortical lesion in the medial upper right kidney is too small to characterize and is unchanged, considered benign. No contour deforming renal masses. Mild right hydronephrosis with moderately dilated right extrarenal pelvis with somewhat abrupt caliber transition at the right ureteropelvic junction, increased since 08/10/2018 CT. Moderate left hydronephrosis with abrupt caliber transition at the left ureteropelvic junction, mildly increased since 08/10/2018 CT. Normal caliber ureters. No UPJ or ureteral stones. Normal bladder, with no bladder stones.  Stomach/Bowel: Curvilinear hyperdensity adjacent to the esophagogastric junction. Stomach is nondistended with no acute gastric abnormality. Normal caliber small bowel with no small bowel wall thickening. Normal appendix. Normal large bowel with no diverticulosis, large bowel wall thickening or pericolonic fat stranding.  Vascular/Lymphatic: Atherosclerotic nonaneurysmal abdominal aorta. There is worsening fat stranding throughout the retroperitoneum surrounding the IVC and abdominal aorta and proximal ureters bilaterally. No pathologically enlarged lymph nodes in  the abdomen or pelvis.  Reproductive: Grossly normal uterus. No adnexal mass. Bilateral tubal ligation clips in place.  Other: No pneumoperitoneum, ascites or focal fluid collection.  Musculoskeletal: No aggressive appearing focal osseous lesions. Severe degenerative disc disease at L3-4. Vertebroplasty material again noted within mild-to-moderate L3 vertebral compression fracture. Prominent bilateral lower lumbar facet arthropathy with stable grade 1 anterolisthesis at L5-S1.  IMPRESSION: 1. 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 2. Mild right and moderate left hydronephrosis with abrupt caliber transitions at the UPJ bilaterally, without evidence of obstructing stone or mass on this noncontrast scan. Findings could represent progressive proximal ureteral obstruction due to retroperitoneal fibrosis. 3. Tiny nonobstructing stones in both kidneys. 4. Mild to moderate bronchiectasis and thick bandlike scarring at both lung bases, similar to slightly worsened. 5.  Aortic Atherosclerosis (ICD10-I70.0).   Electronically Signed By: Ilona Sorrel M.D. On: 02/26/2019 15:50   Assessment & Plan:    1. Nephrolithiasis -RTC 3 months with a renal US - Urinalysis, Routine w reflex microscopic  2. Retroperitoneal fibrosis -referral to rheumatology   No follow-ups on file.  [name], MD  [facility] [place]

## 2020-02-19 NOTE — Progress Notes (Signed)
Urological Symptom Review  Patient is experiencing the following symptoms: Frequent urination   Review of Systems  Gastrointestinal (upper)  : Negative for upper GI symptoms  Gastrointestinal (lower) : Constipation  Constitutional : Night Sweats  Skin: Negative for skin symptoms  Eyes: Negative for eye symptoms  Ear/Nose/Throat : Sore throat Sinus problems  Hematologic/Lymphatic: Negative for Hematologic/Lymphatic symptoms  Cardiovascular : Negative for cardiovascular symptoms  Respiratory : Cough  Endocrine: Negative for endocrine symptoms  Musculoskeletal: Back pain Joint pain  Neurological: Negative for neurological symptoms  Psychologic: Negative for psychiatric symptoms

## 2020-02-19 NOTE — Patient Instructions (Signed)
Dietary Guidelines to Help Prevent Kidney Stones Kidney stones are deposits of minerals and salts that form inside your kidneys. Your risk of developing kidney stones may be greater depending on your diet, your lifestyle, the medicines you take, and whether you have certain medical conditions. Most people can reduce their chances of developing kidney stones by following the instructions below. Depending on your overall health and the type of kidney stones you tend to develop, your dietitian may give you more specific instructions. What are tips for following this plan? Reading food labels  Choose foods with "no salt added" or "low-salt" labels. Limit your sodium intake to less than 1500 mg per day.  Choose foods with calcium for each meal and snack. Try to eat about 300 mg of calcium at each meal. Foods that contain 200-500 mg of calcium per serving include: ? 8 oz (237 ml) of milk, fortified nondairy milk, and fortified fruit juice. ? 8 oz (237 ml) of kefir, yogurt, and soy yogurt. ? 4 oz (118 ml) of tofu. ? 1 oz of cheese. ? 1 cup (300 g) of dried figs. ? 1 cup (91 g) of cooked broccoli. ? 1-3 oz can of sardines or mackerel.  Most people need 1000 to 1500 mg of calcium each day. Talk to your dietitian about how much calcium is recommended for you. Shopping  Buy plenty of fresh fruits and vegetables. Most people do not need to avoid fruits and vegetables, even if they contain nutrients that may contribute to kidney stones.  When shopping for convenience foods, choose: ? Whole pieces of fruit. ? Premade salads with dressing on the side. ? Low-fat fruit and yogurt smoothies.  Avoid buying frozen meals or prepared deli foods.  Look for foods with live cultures, such as yogurt and kefir. Cooking  Do not add salt to food when cooking. Place a salt shaker on the table and allow each person to add his or her own salt to taste.  Use vegetable protein, such as beans, textured vegetable  protein (TVP), or tofu instead of meat in pasta, casseroles, and soups. Meal planning   Eat less salt, if told by your dietitian. To do this: ? Avoid eating processed or premade food. ? Avoid eating fast food.  Eat less animal protein, including cheese, meat, poultry, or fish, if told by your dietitian. To do this: ? Limit the number of times you have meat, poultry, fish, or cheese each week. Eat a diet free of meat at least 2 days a week. ? Eat only one serving each day of meat, poultry, fish, or seafood. ? When you prepare animal protein, cut pieces into small portion sizes. For most meat and fish, one serving is about the size of one deck of cards.  Eat at least 5 servings of fresh fruits and vegetables each day. To do this: ? Keep fruits and vegetables on hand for snacks. ? Eat 1 piece of fruit or a handful of berries with breakfast. ? Have a salad and fruit at lunch. ? Have two kinds of vegetables at dinner.  Limit foods that are high in a substance called oxalate. These include: ? Spinach. ? Rhubarb. ? Beets. ? Potato chips and french fries. ? Nuts.  If you regularly take a diuretic medicine, make sure to eat at least 1-2 fruits or vegetables high in potassium each day. These include: ? Avocado. ? Banana. ? Orange, prune, carrot, or tomato juice. ? Baked potato. ? Cabbage. ? Beans and split   peas. General instructions   Drink enough fluid to keep your urine clear or pale yellow. This is the most important thing you can do.  Talk to your health care provider and dietitian about taking daily supplements. Depending on your health and the cause of your kidney stones, you may be advised: ? Not to take supplements with vitamin C. ? To take a calcium supplement. ? To take a daily probiotic supplement. ? To take other supplements such as magnesium, fish oil, or vitamin B6.  Take all medicines and supplements as told by your health care provider.  Limit alcohol intake to no  more than 1 drink a day for nonpregnant women and 2 drinks a day for men. One drink equals 12 oz of beer, 5 oz of wine, or 1 oz of hard liquor.  Lose weight if told by your health care provider. Work with your dietitian to find strategies and an eating plan that works best for you. What foods are not recommended? Limit your intake of the following foods, or as told by your dietitian. Talk to your dietitian about specific foods you should avoid based on the type of kidney stones and your overall health. Grains Breads. Bagels. Rolls. Baked goods. Salted crackers. Cereal. Pasta. Vegetables Spinach. Rhubarb. Beets. Canned vegetables. Pickles. Olives. Meats and other protein foods Nuts. Nut butters. Large portions of meat, poultry, or fish. Salted or cured meats. Deli meats. Hot dogs. Sausages. Dairy Cheese. Beverages Regular soft drinks. Regular vegetable juice. Seasonings and other foods Seasoning blends with salt. Salad dressings. Canned soups. Soy sauce. Ketchup. Barbecue sauce. Canned pasta sauce. Casseroles. Pizza. Lasagna. Frozen meals. Potato chips. French fries. Summary  You can reduce your risk of kidney stones by making changes to your diet.  The most important thing you can do is drink enough fluid. You should drink enough fluid to keep your urine clear or pale yellow.  Ask your health care provider or dietitian how much protein from animal sources you should eat each day, and also how much salt and calcium you should have each day. This information is not intended to replace advice given to you by your health care provider. Make sure you discuss any questions you have with your health care provider. Document Revised: 06/05/2018 Document Reviewed: 01/25/2016 Elsevier Patient Education  2020 [employer]  

## 2020-02-23 DIAGNOSIS — Z1331 Encounter for screening for depression: Secondary | ICD-10-CM | POA: Diagnosis not present

## 2020-02-23 DIAGNOSIS — I10 Essential (primary) hypertension: Secondary | ICD-10-CM | POA: Diagnosis not present

## 2020-02-23 DIAGNOSIS — Z7189 Other specified counseling: Secondary | ICD-10-CM | POA: Diagnosis not present

## 2020-02-23 DIAGNOSIS — Z0000 Encounter for general adult medical examination without abnormal findings: Secondary | ICD-10-CM | POA: Diagnosis not present

## 2020-02-23 DIAGNOSIS — R5383 Other fatigue: Secondary | ICD-10-CM | POA: Diagnosis not present

## 2020-02-23 DIAGNOSIS — Z79899 Other long term (current) drug therapy: Secondary | ICD-10-CM | POA: Diagnosis not present

## 2020-02-23 DIAGNOSIS — Z6828 Body mass index (BMI) 28.0-28.9, adult: Secondary | ICD-10-CM | POA: Diagnosis not present

## 2020-02-23 DIAGNOSIS — Z1339 Encounter for screening examination for other mental health and behavioral disorders: Secondary | ICD-10-CM | POA: Diagnosis not present

## 2020-02-23 DIAGNOSIS — J309 Allergic rhinitis, unspecified: Secondary | ICD-10-CM | POA: Diagnosis not present

## 2020-02-23 DIAGNOSIS — Z299 Encounter for prophylactic measures, unspecified: Secondary | ICD-10-CM | POA: Diagnosis not present

## 2020-02-23 DIAGNOSIS — J019 Acute sinusitis, unspecified: Secondary | ICD-10-CM | POA: Diagnosis not present

## 2020-02-23 DIAGNOSIS — E7800 Pure hypercholesterolemia, unspecified: Secondary | ICD-10-CM | POA: Diagnosis not present

## 2020-03-02 DIAGNOSIS — M961 Postlaminectomy syndrome, not elsewhere classified: Secondary | ICD-10-CM | POA: Diagnosis not present

## 2020-03-02 DIAGNOSIS — M5136 Other intervertebral disc degeneration, lumbar region: Secondary | ICD-10-CM | POA: Diagnosis not present

## 2020-03-02 DIAGNOSIS — F1120 Opioid dependence, uncomplicated: Secondary | ICD-10-CM | POA: Diagnosis not present

## 2020-03-05 DIAGNOSIS — M5459 Other low back pain: Secondary | ICD-10-CM | POA: Diagnosis not present

## 2020-03-05 DIAGNOSIS — M4310 Spondylolisthesis, site unspecified: Secondary | ICD-10-CM | POA: Insufficient documentation

## 2020-03-05 DIAGNOSIS — M4150 Other secondary scoliosis, site unspecified: Secondary | ICD-10-CM | POA: Insufficient documentation

## 2020-03-05 DIAGNOSIS — M4180 Other forms of scoliosis, site unspecified: Secondary | ICD-10-CM | POA: Insufficient documentation

## 2020-03-09 ENCOUNTER — Ambulatory Visit: Payer: PPO | Admitting: Internal Medicine

## 2020-03-09 ENCOUNTER — Encounter: Payer: Self-pay | Admitting: Internal Medicine

## 2020-03-09 ENCOUNTER — Other Ambulatory Visit: Payer: Self-pay

## 2020-03-09 VITALS — BP 128/62 | HR 59 | Ht 61.0 in | Wt 155.4 lb

## 2020-03-09 DIAGNOSIS — N135 Crossing vessel and stricture of ureter without hydronephrosis: Secondary | ICD-10-CM

## 2020-03-09 DIAGNOSIS — K682 Retroperitoneal fibrosis: Secondary | ICD-10-CM | POA: Insufficient documentation

## 2020-03-09 NOTE — Progress Notes (Signed)
Office Visit Note  Patient: Autumn Duran             Date of Birth: Apr 08, 1950           MRN: [id number]             PCP: [name], MD Referring: Cleon Gustin, MD Visit Date: 03/09/2020  Subjective:  New Patient (Initial Visit) (Patient complains of right lower abdominal pain. )   History of Present Illness: Autumn Duran is a 70 y.o. 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 She has a past history of plasmacytoma of the bone treated with kyphoplasty and stereotactic radiation treatment in 4496 later complications of compression fracture at L3 with complications of diskitis and osteomyelitis s/p prolonged antibiotic therapy. Also history of osteoporosis, GERD, hypertension, and iron deficiency anemia. She had multiple abdominal CT scans in 2020 on account of right lower abdominal pain and numerous mostly nonobstructing renal calculi and hydronephrosis that by 01/2019 demonstrated retroperitoneal stranding and UPJ stenosis concerning for RPF. Workup also revealed increased IgG without a monocloncal gammopathy. She does have chronic dry eyes and mouth treated with lubricating eye drops. She does not have problems with excess dental decay and no history of eye abrasions. She does not notice lymphadenopathy, skin rashes, or swelling over the eyes or cheeks.  Activities of Daily Living:  Patient reports morning stiffness for 0 minutes.   Patient Reports nocturnal pain.  Difficulty dressing/grooming: Denies Difficulty climbing stairs: Denies Difficulty getting out of chair: Denies Difficulty using hands for taps, buttons, cutlery, and/or writing: Denies  Review of Systems  Constitutional: Positive for fatigue.  HENT: Positive for mouth dryness. Negative for mouth sores and nose dryness.   Eyes: Positive for dryness. Negative for pain, itching and visual disturbance.  Respiratory: Positive for cough. Negative for hemoptysis, shortness of breath and difficulty  breathing.   Cardiovascular: Negative for chest pain, palpitations and swelling in legs/feet.  Gastrointestinal: Positive for constipation. Negative for abdominal pain, blood in stool and diarrhea.  Endocrine: Negative for increased urination.  Genitourinary: Negative for painful urination.  Musculoskeletal: Positive for arthralgias, joint pain, myalgias and myalgias. Negative for joint swelling, muscle weakness, morning stiffness and muscle tenderness.  Skin: Negative for color change, rash and redness.  Allergic/Immunologic: Negative for susceptible to infections.  Neurological: Negative for dizziness, numbness, headaches, memory loss and weakness.  Hematological: Negative for swollen glands.  Psychiatric/Behavioral: Positive for sleep disturbance. Negative for confusion.    PMFS History:  Patient Active Problem List   Diagnosis Date Noted  . Retroperitoneal fibrosis 03/09/2020  . Osteoporosis 12/10/2017  . Dysphagia 06/26/2017  . Gastroesophageal reflux disease 06/26/2017  . Hx of colonic polyps 06/26/2017  . Nausea with vomiting 10/12/2014  . MRSA infection   . Osteomyelitis of lumbar spine ([place]) 08/13/2014  . Acute low back pain 08/13/2014  . Epidural abscess 08/13/2014  . Hypertension   . Chronic back pain   . Essential hypertension   . Other iron deficiency anemia 04/10/2014  . 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[place]) 03/23/2014  . Right hip pain 12/23/2013  . Epidural abscess, L2-L5 11/04/2013  . Sepsis ([place]) 09/21/2013  . Hypercalcemia 08/20/2013  . Hypokalemia 08/20/2013  . Lumbar compression fracture ([place]) 07/12/2013  . Compression fracture 07/12/2013  . Benign neoplasm of colon 12/18/2011  . Esophageal reflux 12/18/2011  . Other dysphagia 12/18/2011    Past Medical History:  Diagnosis Date  . Bronchitis, chronic ([place])   . Chronic back pain   .  Chronic kidney disease    kidney stone s/p stent placement ( removed)  . Complication of anesthesia   . Compression fracture of  L3 lumbar vertebra   . Diverticulosis   . GERD (gastroesophageal reflux disease)   . Hemorrhoids   . Hiatal hernia   . History of kidney stones   . Hx of adenomatous colonic polyps   . Hyperlipidemia   . Hypertension   . Lumbar radicular pain   . Osteoporosis 12/10/2017  . PONV (postoperative nausea and vomiting)   . Smoldering myeloma ([place])   . Sore throat   . T7 vertebral fracture ([facility])     Family History  Problem Relation Age of Onset  . Breast cancer Sister 79  . Breast cancer Other 56       aunt  . Hypertension Mother   . COPD Mother   . COPD Father   . Hypertension Sister   . Aortic aneurysm Sister   . Hypertension Son    Past Surgical History:  Procedure Laterality Date  . BACK SURGERY    . BONE MARROW ASPIRATION Left 02/2014  . BONE MARROW BIOPSY Left 02/2014  . CHOLECYSTECTOMY    . COLONOSCOPY  12/18/2011   Procedure: COLONOSCOPY;  Surgeon: Lafayette Dragon, MD;  [facility];  Service: Endoscopy;  Laterality: N/A;  . COLONOSCOPY WITH PROPOFOL N/A 08/03/2017   Procedure: COLONOSCOPY WITH PROPOFOL;  Surgeon: Rogene Houston, MD;  [facility];  Service: Endoscopy;  Laterality: N/A;  . CYSTOSCOPY/RETROGRADE/URETEROSCOPY/STONE EXTRACTION WITH BASKET Bilateral 04/18/2018   Procedure: CYSTOSCOPY/BILATERAL RETROGRADE/URETEROSCOPY/STONE EXTRACTION WITH BASKET WITH RIGHT STENT PLACEMENT;  Surgeon: Cleon Gustin, MD;  [facility];  Service: Urology;  Laterality: Bilateral;  1 HR  . ESOPHAGEAL DILATION N/A 08/03/2017   Procedure: ESOPHAGEAL DILATION;  Surgeon: Rogene Houston, MD;  [facility];  Service: Endoscopy;  Laterality: N/A;  . ESOPHAGOGASTRODUODENOSCOPY  12/18/2011   Procedure: ESOPHAGOGASTRODUODENOSCOPY (EGD);  Surgeon: Lafayette Dragon, MD;  Location[facility];  Service: Endoscopy;  Laterality: N/A;  . ESOPHAGOGASTRODUODENOSCOPY (EGD) WITH PROPOFOL N/A 08/03/2017   Procedure: ESOPHAGOGASTRODUODENOSCOPY (EGD) WITH PROPOFOL;   Surgeon: Rogene Houston, MD;  [facility];  Service: Endoscopy;  Laterality: N/A;  pt knows to arrive at 6:15  . HERNIA REPAIR    . HOLMIUM LASER APPLICATION Right 9/37/9024   Procedure: HOLMIUM LASER APPLICATION;  Surgeon: Cleon Gustin, MD;  [facility];  Service: Urology;  Laterality: Right;  . LUMBAR LAMINECTOMY FOR EPIDURAL ABSCESS Left 09/22/2013   Procedure: LUMBAR LAMINECTOMY FOR EPIDURAL ABSCESS LEFT LUMBAR FIVE-SACRAL ONE;  Surgeon: Charlie Pitter, MD;  Location: [place] [facility];  Service: Neurosurgery;  Laterality: Left;  . RADIOLOGY WITH ANESTHESIA N/A 08/13/2014   Procedure: RADIOLOGY WITH ANESTHESIA ;  Surgeon: Medication Radiologist, MD;  Location: [place] [facility];  Service: Radiology;  Laterality: N/A;  . URETERAL STENT PLACEMENT    . VERTEBROPLASTY N/A 07/14/2013   Procedure: VERTEBROPLASTY WITH LUMBAR THREE BIOPSY;  Surgeon: Charlie Pitter, MD;  Location: [place] [facility];  Service: Neurosurgery;  Laterality: N/A;  VERTEBROPLASTY WITH LUMBAR THREE BIOPSY   Social History   Social History Narrative  . Not on file   There is no immunization history for the selected administration types on file for this patient.   Objective: Vital Signs: BP 128/62 (BP Location: Right Arm, Patient Position: Sitting, Cuff Size: Normal)   Pulse (!) 59   Ht '5\' 1"'  (1.549 m)   Wt  155 lb 6.4 oz (70.5 kg)   BMI 29.36 kg/m    Physical Exam HENT:     Right Ear: External ear normal.     Left Ear: External ear normal.     Mouth/Throat:     Mouth: Mucous membranes are moist.     Pharynx: Oropharynx is clear.  Eyes:     Conjunctiva/sclera: Conjunctivae normal.  Cardiovascular:     Rate and Rhythm: Normal rate and regular rhythm.  Pulmonary:     Effort: Pulmonary effort is normal.     Breath sounds: Normal breath sounds.  Abdominal:     Comments: Right lower quadrant pain but no tenderness to palpation  Skin:    General: Skin is warm and dry.     Findings: No rash.  Neurological:      General: No focal deficit present.     Mental Status: She is alert.  Psychiatric:        Mood and Affect: Mood normal.     Musculoskeletal Exam:  Neck full range of motion Shoulder, elbow, wrist, fingers full range of motion no swelling Bilateral paraspinal tenderness to palpation along top of sacrum Bilateral patellofemoral crepitus, ankles full ROM   Investigation: No additional findings.  Imaging: Ultrasound renal complete  Result Date: 02/16/2020 CLINICAL DATA:  Retroperitoneal fibrosis/nephrolithiasis. EXAM: RENAL / URINARY TRACT ULTRASOUND COMPLETE COMPARISON:  Ultrasound 02/24/2019. FINDINGS: Right Kidney: Renal measurements: 13.1 x 5.4 x 7.3 cm = volume: 274 mL. Echogenicity within normal limits. Moderate hydronephrosis, progressed from prior. 1.2 cm calculus within the inferior pole. Left Kidney: Renal measurements: 12.2 x 7.3 x 6.1 cm = volume: 283 mL. Echogenicity within normal limits. Severe hydronephrosis, progressed from prior. 1.3 cm calculus within the superior pole. Bladder: Appears normal for degree of bladder distention. Bilateral ureteral jets are noted. IMPRESSION: 1. Moderate right and severe left hydronephrosis, progressed from the prior from 02/24/2019. 2. Bilateral nephrolithiasis, as detailed above. Electronically Signed   By: Margaretha Sheffield MD   On: 02/16/2020 16:47    Recent Labs: Lab Results  Component Value Date   WBC 6.8 06/12/2019   HGB 12.5 06/12/2019   PLT 317 06/12/2019   NA 138 06/12/2019   K 4.1 06/12/2019   CL 102 06/12/2019   CO2 28 06/12/2019   GLUCOSE 97 06/12/2019   BUN 14 06/12/2019   CREATININE 0.77 06/12/2019   BILITOT 0.4 06/12/2019   ALKPHOS 79 06/12/2019   AST 16 06/12/2019   ALT 11 06/12/2019   PROT 7.8 06/12/2019   ALBUMIN 3.5 06/12/2019   CALCIUM 9.9 06/12/2019   GFRAA >60 06/12/2019    Speciality Comments: No specialty comments available.  Procedures:  No procedures performed Allergies: Chlorhexidine  gluconate, Morphine and related, and Tetracycline   Assessment / Plan:     Visit Diagnoses: Retroperitoneal fibrosis - Plan: Sedimentation rate, C-reactive protein, Basic Metabolic Panel (BMET), IGG SUBCLASS 4  Imaging symptoms and urinary tract problems are all consistent with RPF. Concern for primary/idiopathic process such as IgG4-RD versus previous radiation exposure as risk factor. Will check inflammatory serology, IgG subclass 4 serum level, also repeat Bmet for renal function. Most likely will plan for initial treatment on glucocorticoids depending and serology and durability of response whether additional DMARDs needed. I recommend she follow up for planned spine MRI before starting, if recurrence of infection were suspected would be hesitant to treat on fairly high dose prednisone prior to this being addressed.  Orders: Orders Placed This Encounter  Procedures  . Sedimentation  rate  . C-reactive protein  . Basic Metabolic Panel (BMET)  . IGG SUBCLASS 4   No orders of the defined types were placed in this encounter.   Follow-Up Instructions: Return in about 22 days (around 03/31/2020).   Collier Salina, MD  Note - This record has been created using [employer].  Chart creation errors have been sought, but may not always  have been located. Such creation errors do not reflect on  the standard of medical care.

## 2020-03-14 LAB — BASIC METABOLIC PANEL
BUN/Creatinine Ratio: 16 (calc) (ref 6–22)
BUN: 19 mg/dL (ref 7–25)
CO2: 29 mmol/L (ref 20–32)
Calcium: 10.3 mg/dL (ref 8.6–10.4)
Chloride: 106 mmol/L (ref 98–110)
Creat: 1.19 mg/dL — ABNORMAL HIGH (ref 0.50–0.99)
Glucose, Bld: 82 mg/dL (ref 65–99)
Potassium: 4.1 mmol/L (ref 3.5–5.3)
Sodium: 143 mmol/L (ref 135–146)

## 2020-03-14 LAB — C-REACTIVE PROTEIN: CRP: 42.5 mg/L — ABNORMAL HIGH (ref <8.0)

## 2020-03-14 LAB — IGG SUBCLASS 4: IgG Subclass 4: 37.7 mg/dL (ref 4.0–86.0)

## 2020-03-14 LAB — SEDIMENTATION RATE: Sed Rate: 74 mm/h — ABNORMAL HIGH (ref 0–30)

## 2020-03-16 NOTE — Progress Notes (Signed)
I spoke with Autumn Duran her recent lab results are consistent with ongoing inflammation with high ESR and CRP. Serum creatinine is also increased at 1.19 from what looks like a normal baseline. IgG 4 serum level is normal which is not highly sensitive for IgG4-RD but does make this less likely. I recommend she have her upcoming MRI without contrast based on renal functio currently trending slightly in the wrong direction and with an ongoing fibrosis process already. I would like her to have her upcoming imaging test before starting treatments since prednisone may mask any inflammation or swelling findings and symptoms are not rapidly changing to need immediate treatment start.

## 2020-03-18 ENCOUNTER — Telehealth: Payer: Self-pay | Admitting: Radiology

## 2020-03-18 NOTE — Telephone Encounter (Signed)
Spoke with Melissa at [facility] - advised per Dr. Benjamine Mola --  Can you contact Dr. Melina Schools with [facility] office to call or otherwise pass along the following message:   Ms. Mccaughey requests we notify her orthopedist's office that we recommend her upcoming MRI be conducted without contrast due to upward trending in serum creatinine and suspect actively ongoing retroperitoneal fibrosis causing hydronephrosis.   If contrast is needed for this study to be adequate, it is not contraindicated by latest GFR but would recommend getting a nephrology input.

## 2020-03-30 DIAGNOSIS — M5459 Other low back pain: Secondary | ICD-10-CM | POA: Diagnosis not present

## 2020-03-31 ENCOUNTER — Other Ambulatory Visit: Payer: Self-pay

## 2020-03-31 ENCOUNTER — Ambulatory Visit: Payer: PPO | Admitting: Internal Medicine

## 2020-03-31 ENCOUNTER — Encounter: Payer: Self-pay | Admitting: Internal Medicine

## 2020-03-31 VITALS — BP 129/76 | HR 61 | Ht 61.0 in | Wt 157.0 lb

## 2020-03-31 DIAGNOSIS — N135 Crossing vessel and stricture of ureter without hydronephrosis: Secondary | ICD-10-CM | POA: Diagnosis not present

## 2020-03-31 MED ORDER — PREDNISONE 20 MG PO TABS: 40.0000 mg | ORAL_TABLET | Freq: Every day | ORAL | 0 refills | Status: DC

## 2020-03-31 NOTE — Progress Notes (Signed)
Office Visit Note  Patient: Autumn Duran             Date of Birth: January 04, 1951           MRN: [id number]             PCP: [name], MD Referring: [name], MD Visit Date: 03/31/2020  Subjective:   History of Present Illness: Autumn Duran is a 70 y.o. female here for follow up of retroperitoneal fibrosis. At last visit IgG class 4 level was normal but inflammatory markers are very high. Serum creatinine also mildly increased, with previous renal involvement of hydronephrosis in the hospital last year that had returned to normal. She was following up for lumbar spine MRI in the interval which was completed yesterday monitoring for any recurrence of plasmacytoma. She continues to have some diffuse low back muscle pain, but more severely is RLQ pain coming and going.    Review of Systems  Constitutional: Positive for fatigue.  HENT: Negative for mouth sores, mouth dryness and nose dryness.   Eyes: Positive for visual disturbance. Negative for pain, itching and dryness.  Respiratory: Positive for cough. Negative for hemoptysis, shortness of breath and difficulty breathing.   Cardiovascular: Negative for chest pain, palpitations and swelling in legs/feet.  Gastrointestinal: Positive for constipation. Negative for abdominal pain, blood in stool and diarrhea.  Endocrine: Negative for increased urination.  Genitourinary: Negative for painful urination.  Musculoskeletal: Positive for morning stiffness. Negative for arthralgias, joint pain, joint swelling, myalgias, muscle weakness, muscle tenderness and myalgias.  Skin: Negative for color change, rash and redness.  Allergic/Immunologic: Negative for susceptible to infections.  Neurological: Negative for dizziness, numbness, headaches, memory loss and weakness.  Hematological: Negative for swollen glands.  Psychiatric/Behavioral: Positive for sleep disturbance. Negative for confusion.    PMFS History:  Patient Active Problem List    Diagnosis Date Noted  . Retroperitoneal fibrosis 03/09/2020  . Osteoporosis 12/10/2017  . Dysphagia 06/26/2017  . Gastroesophageal reflux disease 06/26/2017  . Hx of colonic polyps 06/26/2017  . Nausea with vomiting 10/12/2014  . MRSA infection   . Osteomyelitis of lumbar spine ([place]) 08/13/2014  . Acute low back pain 08/13/2014  . Epidural abscess 08/13/2014  . Hypertension   . Chronic back pain   . Essential hypertension   . Other iron deficiency anemia 04/10/2014  . 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[place]) 03/23/2014  . Right hip pain 12/23/2013  . Epidural abscess, L2-L5 11/04/2013  . Sepsis ([place]) 09/21/2013  . Hypercalcemia 08/20/2013  . Hypokalemia 08/20/2013  . Lumbar compression fracture ([place]) 07/12/2013  . Compression fracture 07/12/2013  . Benign neoplasm of colon 12/18/2011  . Esophageal reflux 12/18/2011  . Other dysphagia 12/18/2011    Past Medical History:  Diagnosis Date  . Bronchitis, chronic ([place])   . Chronic back pain   . Chronic kidney disease    kidney stone s/p stent placement ( removed)  . Complication of anesthesia   . Compression fracture of L3 lumbar vertebra   . Diverticulosis   . GERD (gastroesophageal reflux disease)   . Hemorrhoids   . Hiatal hernia   . History of kidney stones   . Hx of adenomatous colonic polyps   . Hyperlipidemia   . Hypertension   . Lumbar radicular pain   . Osteoporosis 12/10/2017  . PONV (postoperative nausea and vomiting)   . Smoldering myeloma ([place])   . Sore throat   . T7 vertebral fracture [facility])     Family  History  Problem Relation Age of Onset  . Breast cancer Sister 13  . Breast cancer Other 72       aunt  . Hypertension Mother   . COPD Mother   . COPD Father   . Hypertension Sister   . Aortic aneurysm Sister   . Hypertension Son    Past Surgical History:  Procedure Laterality Date  . BACK SURGERY    . BONE MARROW ASPIRATION Left 02/2014  . BONE MARROW BIOPSY Left 02/2014  . CHOLECYSTECTOMY    . COLONOSCOPY   12/18/2011   Procedure: COLONOSCOPY;  Surgeon: Lafayette Dragon, MD;  [facility];  Service: Endoscopy;  Laterality: N/A;  . COLONOSCOPY WITH PROPOFOL N/A 08/03/2017   Procedure: COLONOSCOPY WITH PROPOFOL;  Surgeon: Rogene Houston, MD;  [facility];  Service: Endoscopy;  Laterality: N/A;  . CYSTOSCOPY/RETROGRADE/URETEROSCOPY/STONE EXTRACTION WITH BASKET Bilateral 04/18/2018   Procedure: CYSTOSCOPY/BILATERAL RETROGRADE/URETEROSCOPY/STONE EXTRACTION WITH BASKET WITH RIGHT STENT PLACEMENT;  Surgeon: Cleon Gustin, MD;  [facility];  Service: Urology;  Laterality: Bilateral;  1 HR  . ESOPHAGEAL DILATION N/A 08/03/2017   Procedure: ESOPHAGEAL DILATION;  Surgeon: Rogene Houston, MD;  [facility];  Service: Endoscopy;  Laterality: N/A;  . ESOPHAGOGASTRODUODENOSCOPY  12/18/2011   Procedure: ESOPHAGOGASTRODUODENOSCOPY (EGD);  Surgeon: Lafayette Dragon, MD;  Location[facility];  Service: Endoscopy;  Laterality: N/A;  . ESOPHAGOGASTRODUODENOSCOPY (EGD) WITH PROPOFOL N/A 08/03/2017   Procedure: ESOPHAGOGASTRODUODENOSCOPY (EGD) WITH PROPOFOL;  Surgeon: Rogene Houston, MD;  [facility];  Service: Endoscopy;  Laterality: N/A;  pt knows to arrive at 6:15  . HERNIA REPAIR    . HOLMIUM LASER APPLICATION Right 03/21/4823   Procedure: HOLMIUM LASER APPLICATION;  Surgeon: Cleon Gustin, MD;  [facility];  Service: Urology;  Laterality: Right;  . LUMBAR LAMINECTOMY FOR EPIDURAL ABSCESS Left 09/22/2013   Procedure: LUMBAR LAMINECTOMY FOR EPIDURAL ABSCESS LEFT LUMBAR FIVE-SACRAL ONE;  Surgeon: Charlie Pitter, MD;  Location: [place] [facility];  Service: Neurosurgery;  Laterality: Left;  . RADIOLOGY WITH ANESTHESIA N/A 08/13/2014   Procedure: RADIOLOGY WITH ANESTHESIA ;  Surgeon: Medication Radiologist, MD;  Location: [place] [facility];  Service: Radiology;  Laterality: N/A;  . URETERAL STENT PLACEMENT    . VERTEBROPLASTY N/A 07/14/2013   Procedure: VERTEBROPLASTY WITH  LUMBAR THREE BIOPSY;  Surgeon: Charlie Pitter, MD;  Location: [place] [facility];  Service: Neurosurgery;  Laterality: N/A;  VERTEBROPLASTY WITH LUMBAR THREE BIOPSY   Social History   Social History Narrative  . Not on file   There is no immunization history for the selected administration types on file for this patient.   Objective: Vital Signs: BP 129/76 (BP Location: Right Arm, Patient Position: Sitting, Cuff Size: Normal)   Pulse 61   Ht '5\' 1"'  (1.549 m)   Wt 157 lb (71.2 kg)   BMI 29.66 kg/m    Physical Exam Skin:    General: Skin is warm and dry.     Findings: No rash.  Neurological:     Mental Status: She is alert.  Psychiatric:        Mood and Affect: Mood normal.     Musculoskeletal Exam:  Mild low back paraspinal tenderness, RLQ pain without tenderness to pressure    Investigation: No additional findings.  Imaging: No results found.  Recent Labs: Lab Results  Component Value Date   WBC 6.8 06/12/2019   HGB 12.5 06/12/2019   PLT 317 06/12/2019  NA 143 03/09/2020   K 4.1 03/09/2020   CL 106 03/09/2020   CO2 29 03/09/2020   GLUCOSE 82 03/09/2020   BUN 19 03/09/2020   CREATININE 1.19 (H) 03/09/2020   BILITOT 0.4 06/12/2019   ALKPHOS 79 06/12/2019   AST 16 06/12/2019   ALT 11 06/12/2019   PROT 7.8 06/12/2019   ALBUMIN 3.5 06/12/2019   CALCIUM 10.3 03/09/2020   GFRAA >60 06/12/2019    Speciality Comments: No specialty comments available.  Procedures:  No procedures performed Allergies: Chlorhexidine gluconate, Morphine and related, and Tetracycline   Assessment / Plan:     Visit Diagnoses: Retroperitoneal fibrosis - Plan: predniSONE (DELTASONE) 20 MG tablet  Clinical presentation and findings seem most consistent with fibrosis secondary to prior radiation exposure, no systemic evidence of underlying inflammatory disease to date. Currently biggest concern is for possible renal complication of obstructive nephropathy. Initial treatment plan with 79m PO  daily prednisone and will f/u in 2 weeks to check symptoms also inflammatory markers and renal function. Also to f/u after MRI report to exclude need for biopsy or paraneoplastic investigation.  Orders: No orders of the defined types were placed in this encounter.  Meds ordered this encounter  Medications  . predniSONE (DELTASONE) 20 MG tablet    Sig: Take 2 tablets (40 mg total) by mouth daily with breakfast.    Dispense:  60 tablet    Refill:  0     Follow-Up Instructions: Return in about 2 weeks (around 04/14/2020).   CCollier Salina MD  Note - This record has been created using [employer]  Chart creation errors have been sought, but may not always  have been located. Such creation errors do not reflect on  the standard of medical care.

## 2020-04-06 DIAGNOSIS — M4180 Other forms of scoliosis, site unspecified: Secondary | ICD-10-CM | POA: Diagnosis not present

## 2020-04-06 DIAGNOSIS — M4310 Spondylolisthesis, site unspecified: Secondary | ICD-10-CM | POA: Diagnosis not present

## 2020-04-06 DIAGNOSIS — T148XXA Other injury of unspecified body region, initial encounter: Secondary | ICD-10-CM | POA: Diagnosis not present

## 2020-04-12 NOTE — Progress Notes (Signed)
Office Visit Note  Patient: Autumn Duran             Date of Birth: 02-24-1951           MRN: [id number]             PCP: [name], MD Referring: [name], MD Visit Date: 04/13/2020   Subjective:   History of Present Illness: Autumn Duran is a 70 y.o. female here for follow up for retroperitoneal fibrosis. Since her last visit she is taking prednisone 39m PO daily she notices difficulty sleeping on this medicine otherwise tolerating. She has had a large improvement in the right flank pain. MRI from orthopedics office was reviewed this did not indicate any recurrence of mass around her previous plasmacytoma site. There was moderate right and severe left hydronephrosis extending to the L3 level otherwise normal ureter beyond that.     Review of Systems  Constitutional: Positive for fatigue.  HENT: Negative for mouth sores, mouth dryness and nose dryness.   Eyes: Positive for dryness. Negative for pain, itching and visual disturbance.  Respiratory: Negative for cough, hemoptysis, shortness of breath and difficulty breathing.   Cardiovascular: Negative for chest pain, palpitations and swelling in legs/feet.  Gastrointestinal: Positive for constipation. Negative for abdominal pain, blood in stool and diarrhea.  Endocrine: Negative for increased urination.  Genitourinary: Negative for painful urination.  Musculoskeletal: Positive for arthralgias, joint pain and morning stiffness. Negative for joint swelling, myalgias, muscle weakness, muscle tenderness and myalgias.  Skin: Negative for color change, rash and redness.  Allergic/Immunologic: Negative for susceptible to infections.  Neurological: Positive for memory loss and weakness. Negative for dizziness, numbness and headaches.  Hematological: Negative for swollen glands.  Psychiatric/Behavioral: Positive for confusion and sleep disturbance.    PMFS History:  Patient Active Problem List   Diagnosis Date Noted    Bilateral hydronephrosis 04/14/2020   Retroperitoneal fibrosis 03/09/2020   Osteoporosis 12/10/2017   Dysphagia 06/26/2017   Gastroesophageal reflux disease 06/26/2017   Hx of colonic polyps 06/26/2017   Nausea with vomiting 10/12/2014   MRSA infection    [place] 08/13/2014   Acute low back pain 08/13/2014   Epidural abscess 08/13/2014   Hypertension    Chronic back pain    Essential hypertension    Other iron deficiency anemia 04/10/2014   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[facility] 03/23/2014   Right hip pain 12/23/2013   Epidural abscess, L2-L5 11/04/2013   Sepsis ([facility] 09/21/2013   Hypercalcemia 08/20/2013   Hypokalemia 08/20/2013   Lumbar compression fracture ([facility] 07/12/2013   Compression fracture 07/12/2013   Benign neoplasm of colon 12/18/2011   Esophageal reflux 12/18/2011   Other dysphagia 12/18/2011    Past Medical History:  Diagnosis Date   Bronchitis, chronic (HCC)    Chronic back pain    Chronic kidney disease    kidney stone s/p stent placement ( removed)   Complication of anesthesia    Compression fracture of L3 lumbar vertebra    Diverticulosis    GERD (gastroesophageal reflux disease)    Hemorrhoids    Hiatal hernia    History of kidney stones    Hx of adenomatous colonic polyps    Hyperlipidemia    Hypertension    Lumbar radicular pain    Osteoporosis 12/10/2017   PONV (postoperative nausea and vomiting)    Smoldering myeloma ([facility]    Sore throat    T7 vertebral fracture ([facility]     Family  History  Problem Relation Age of Onset   Breast cancer Sister 49   Breast cancer Other 53       aunt   Hypertension Mother    COPD Mother    COPD Father    Hypertension Sister    Aortic aneurysm Sister    Hypertension Son    Past Surgical History:  Procedure Laterality Date   BACK SURGERY     BONE MARROW ASPIRATION Left 02/2014   BONE MARROW BIOPSY Left 02/2014   CHOLECYSTECTOMY      COLONOSCOPY  12/18/2011   Procedure: COLONOSCOPY;  Surgeon: Lafayette Dragon, MD;  [facility];  Service: Endoscopy;  Laterality: N/A;   COLONOSCOPY WITH PROPOFOL N/A 08/03/2017   Procedure: COLONOSCOPY WITH PROPOFOL;  Surgeon: Rogene Houston, MD;  [facility];  Service: Endoscopy;  Laterality: N/A;   CYSTOSCOPY/RETROGRADE/URETEROSCOPY/STONE EXTRACTION WITH BASKET Bilateral 04/18/2018   Procedure: CYSTOSCOPY/BILATERAL RETROGRADE/URETEROSCOPY/STONE EXTRACTION WITH BASKET WITH RIGHT STENT PLACEMENT;  Surgeon: Cleon Gustin, MD;  [facility];  Service: Urology;  Laterality: Bilateral;  1 HR   ESOPHAGEAL DILATION N/A 08/03/2017   Procedure: ESOPHAGEAL DILATION;  Surgeon: Rogene Houston, MD;  [facility];  Service: Endoscopy;  Laterality: N/A;   ESOPHAGOGASTRODUODENOSCOPY  12/18/2011   Procedure: ESOPHAGOGASTRODUODENOSCOPY (EGD);  Surgeon: Lafayette Dragon, MD;  [facility];  Service: Endoscopy;  Laterality: N/A;   ESOPHAGOGASTRODUODENOSCOPY (EGD) WITH PROPOFOL N/A 08/03/2017   Procedure: ESOPHAGOGASTRODUODENOSCOPY (EGD) WITH PROPOFOL;  Surgeon: Rogene Houston, MD;  [facility];  Service: Endoscopy;  Laterality: N/A;  pt knows to arrive at [place] Right 4/46/2863   Procedure: HOLMIUM LASER APPLICATION;  Surgeon: Cleon Gustin, MD;  [facility];  Service: Urology;  Laterality: Right;   LUMBAR LAMINECTOMY FOR EPIDURAL ABSCESS Left 09/22/2013   Procedure: LUMBAR LAMINECTOMY FOR EPIDURAL ABSCESS LEFT LUMBAR FIVE-SACRAL ONE;  Surgeon: Charlie Pitter, MD;  Location: [place] [facility];  Service: Neurosurgery;  Laterality: Left;   RADIOLOGY WITH ANESTHESIA N/A 08/13/2014   Procedure: RADIOLOGY WITH ANESTHESIA ;  Surgeon: Medication Radiologist, MD;  Location: [place] [facility];  Service: Radiology;  Laterality: N/A;   URETERAL STENT PLACEMENT     VERTEBROPLASTY N/A 07/14/2013   Procedure:  VERTEBROPLASTY WITH LUMBAR THREE BIOPSY;  Surgeon: Charlie Pitter, MD;  Location: [place] [facility];  Service: Neurosurgery;  Laterality: N/A;  VERTEBROPLASTY WITH LUMBAR THREE BIOPSY   Social History   Social History Narrative   Not on file   There is no immunization history for the selected administration types on file for this patient.   Objective: Vital Signs: BP (!) 175/73 (BP Location: Left Arm, Patient Position: Sitting, Cuff Size: Normal)    Pulse (!) 45    Ht '5\' 1"'  (1.549 m)    Wt 157 lb 6.4 oz (71.4 kg)    BMI 29.74 kg/m    Physical Exam HENT:     Right Ear: External ear normal.     Left Ear: External ear normal.  Eyes:     Conjunctiva/sclera: Conjunctivae normal.  Abdominal:     General: Bowel sounds are normal. There is no distension.     Palpations: Abdomen is soft.     Tenderness: There is no abdominal tenderness. There is no right CVA tenderness, left CVA tenderness, guarding or rebound.  Skin:    General: Skin is warm and dry.     Findings: No  rash.  Neurological:     General: No focal deficit present.     Mental Status: She is alert.  Psychiatric:        Mood and Affect: Mood normal.     Investigation: No additional findings.  Imaging: No results found.  Recent Labs: Lab Results  Component Value Date   WBC 6.8 06/12/2019   HGB 12.5 06/12/2019   PLT 317 06/12/2019   NA 139 04/13/2020   K 5.3 04/13/2020   CL 103 04/13/2020   CO2 30 04/13/2020   GLUCOSE 110 (H) 04/13/2020   BUN 21 04/13/2020   CREATININE 0.93 04/13/2020   BILITOT 0.4 06/12/2019   ALKPHOS 79 06/12/2019   AST 16 06/12/2019   ALT 11 06/12/2019   PROT 7.8 06/12/2019   ALBUMIN 3.5 06/12/2019   CALCIUM 10.3 04/13/2020   GFRAA >60 06/12/2019    Speciality Comments: No specialty comments available.  Procedures:  No procedures performed Allergies: Chlorhexidine gluconate, Morphine and related, and Tetracycline   Assessment / Plan:     Visit Diagnoses: Retroperitoneal fibrosis -  Plan: Sedimentation rate, Basic Metabolic Panel (BMET), C-reactive protein  Flank pain and abdominal pain improving likely indicating a response. Checking inflammatory labs today ESR, CRP. If improved on current steroid treatment will continue and start slow tapering, if elevated may need trial of additional DMARD. Biggest complication risk currently is renal.  Bilateral hydronephrosis  Moderate to severe enlargement noted on recent MRI and serum creatinine elevation on last metabolic panel. Checking BMP today if not improving with steroid treatment may need urology assessment to relieve and avoid long term injury.  Orders: Orders Placed This Encounter  Procedures   Sedimentation rate   Basic Metabolic Panel (BMET)   C-reactive protein   No orders of the defined types were placed in this encounter.   Follow-Up Instructions: Return in about 4 weeks (around 05/11/2020) for RP f/u.   Collier Salina, MD  Note - This record has been created using [employer].  Chart creation errors have been sought, but may not always  have been located. Such creation errors do not reflect on  the standard of medical care.

## 2020-04-13 ENCOUNTER — Ambulatory Visit: Payer: PPO | Admitting: Internal Medicine

## 2020-04-13 ENCOUNTER — Other Ambulatory Visit: Payer: Self-pay

## 2020-04-13 ENCOUNTER — Encounter: Payer: Self-pay | Admitting: Internal Medicine

## 2020-04-13 VITALS — BP 175/73 | HR 45 | Ht 61.0 in | Wt 157.4 lb

## 2020-04-13 DIAGNOSIS — N1330 Unspecified hydronephrosis: Secondary | ICD-10-CM

## 2020-04-13 DIAGNOSIS — N135 Crossing vessel and stricture of ureter without hydronephrosis: Secondary | ICD-10-CM | POA: Diagnosis not present

## 2020-04-13 DIAGNOSIS — K682 Retroperitoneal fibrosis: Secondary | ICD-10-CM

## 2020-04-13 NOTE — Patient Instructions (Signed)
We will contact you after reviewing lab result with recommendation on medication adjustment. If inflammation is not any less probably cannot reduce dose at this time, but if responding we can start to go down sooner. Hopefully kidney function is also improving and does not need any procedure.

## 2020-04-14 DIAGNOSIS — N1330 Unspecified hydronephrosis: Secondary | ICD-10-CM | POA: Insufficient documentation

## 2020-04-14 LAB — SEDIMENTATION RATE: Sed Rate: 2 mm/h (ref 0–30)

## 2020-04-14 LAB — BASIC METABOLIC PANEL
BUN: 21 mg/dL (ref 7–25)
CO2: 30 mmol/L (ref 20–32)
Calcium: 10.3 mg/dL (ref 8.6–10.4)
Chloride: 103 mmol/L (ref 98–110)
Creat: 0.93 mg/dL (ref 0.50–0.99)
Glucose, Bld: 110 mg/dL — ABNORMAL HIGH (ref 65–99)
Potassium: 5.3 mmol/L (ref 3.5–5.3)
Sodium: 139 mmol/L (ref 135–146)

## 2020-04-14 LAB — C-REACTIVE PROTEIN: CRP: 0.6 mg/L (ref <8.0)

## 2020-04-15 ENCOUNTER — Other Ambulatory Visit: Payer: Self-pay | Admitting: Radiology

## 2020-04-15 DIAGNOSIS — N1330 Unspecified hydronephrosis: Secondary | ICD-10-CM

## 2020-04-15 DIAGNOSIS — N135 Crossing vessel and stricture of ureter without hydronephrosis: Secondary | ICD-10-CM

## 2020-04-15 NOTE — Progress Notes (Signed)
Inflammation numbers are completely down to normal and kidney function test has also returned to normal. This is very encouraging for a good response to the treatment. I recommend we start a slow tapering down of the prednisone. Can decrease the prednisone by 10 mg each 2 weeks-- down to 30 mg for 2 weeks and then 20 mg for 2 weeks. I would like to follow up these labs again in about 1 month to recheck if everything is still doing well at the lower dose.

## 2020-04-15 NOTE — Addendum Note (Signed)
Addended by: Collier Salina on: 04/15/2020 08:09 AM   Modules accepted: Orders

## 2020-04-22 ENCOUNTER — Telehealth: Payer: Self-pay | Admitting: Internal Medicine

## 2020-04-22 NOTE — Telephone Encounter (Signed)
Opened in error

## 2020-04-23 ENCOUNTER — Telehealth: Payer: Self-pay

## 2020-04-23 NOTE — Telephone Encounter (Signed)
Patient advised she can increase back to the previous dose, 40mg  daily for now. We can follow up in a few weeks and discuss tapering again at that time. Patient expressed understanding.

## 2020-04-23 NOTE — Telephone Encounter (Signed)
Patient called stating last week she decreased her Prednisone to 30 mg and now has increased pain on her left side going into her back..  Patient states she was doing great the 2 weeks she was taking 40 mg of Prednisone.  Patient is requesting a return call to let her know if she can increase her dosage.  Please advise.

## 2020-04-23 NOTE — Telephone Encounter (Signed)
She can increase back to the previous dose, 40mg  daily for now. We can follow up in a few weeks and discuss tapering again at that time.

## 2020-04-27 DIAGNOSIS — M5136 Other intervertebral disc degeneration, lumbar region: Secondary | ICD-10-CM | POA: Diagnosis not present

## 2020-04-27 DIAGNOSIS — F1120 Opioid dependence, uncomplicated: Secondary | ICD-10-CM | POA: Diagnosis not present

## 2020-04-27 DIAGNOSIS — M961 Postlaminectomy syndrome, not elsewhere classified: Secondary | ICD-10-CM | POA: Diagnosis not present

## 2020-04-27 DIAGNOSIS — M4626 Osteomyelitis of vertebra, lumbar region: Secondary | ICD-10-CM | POA: Diagnosis not present

## 2020-04-28 ENCOUNTER — Other Ambulatory Visit: Payer: PPO

## 2020-04-28 ENCOUNTER — Telehealth: Payer: Self-pay

## 2020-04-28 ENCOUNTER — Other Ambulatory Visit: Payer: Self-pay

## 2020-04-28 DIAGNOSIS — N390 Urinary tract infection, site not specified: Secondary | ICD-10-CM | POA: Diagnosis not present

## 2020-04-28 LAB — MICROSCOPIC EXAMINATION
Epithelial Cells (non renal): >10 /hpf — AB (ref 0–10)
Renal Epithel, UA: NONE SEEN /hpf

## 2020-04-28 LAB — URINALYSIS, ROUTINE W REFLEX MICROSCOPIC
Bilirubin, UA: NEGATIVE
Glucose, UA: NEGATIVE
Ketones, UA: NEGATIVE
Nitrite, UA: NEGATIVE
Protein,UA: NEGATIVE
Specific Gravity, UA: 1.015 (ref 1.005–1.030)
Urobilinogen, Ur: 0.2 mg/dL (ref 0.2–1.0)
pH, UA: 7 (ref 5.0–7.5)

## 2020-04-28 NOTE — Telephone Encounter (Signed)
Pt called saying she is having side pain and wiped after urination and saw bright red blood. She later urinated again and did not see any. Spoke with Dr. Alyson Ingles and she is coming in to drop off specimen.

## 2020-04-28 NOTE — Telephone Encounter (Deleted)
Patient called experiencing blood when wiping after urination. Needing a call back to discuss and get an appointment asap.  Thanks, Germain Osgood.

## 2020-04-29 ENCOUNTER — Telehealth: Payer: Self-pay

## 2020-04-29 ENCOUNTER — Other Ambulatory Visit: Payer: Self-pay

## 2020-04-29 DIAGNOSIS — N390 Urinary tract infection, site not specified: Secondary | ICD-10-CM

## 2020-04-29 MED ORDER — NITROFURANTOIN MONOHYD MACRO 100 MG PO CAPS: 100.0000 mg | ORAL_CAPSULE | Freq: Two times a day (BID) | ORAL | 0 refills | Status: DC

## 2020-04-29 NOTE — Telephone Encounter (Signed)
Called pt back. Told her she had an infection and Macrobid was sent in for her.

## 2020-05-01 LAB — CULTURE, URINE COMPREHENSIVE

## 2020-05-04 NOTE — Progress Notes (Signed)
Pt.notified

## 2020-05-07 ENCOUNTER — Telehealth: Payer: Self-pay | Admitting: Radiology

## 2020-05-07 DIAGNOSIS — N135 Crossing vessel and stricture of ureter without hydronephrosis: Secondary | ICD-10-CM

## 2020-05-07 MED ORDER — PREDNISONE 20 MG PO TABS: 20.0000 mg | ORAL_TABLET | Freq: Every day | ORAL | 0 refills | Status: DC

## 2020-05-07 NOTE — Telephone Encounter (Signed)
Patient is currently taking Prednisone 20 mg daily. Patient will be out of medication after today's dose. Patient is scheduled to see Dr. Benjamine Mola on Tuesday 05/11/2020. Patient uses [facility].   Okay to refill Prednisone?

## 2020-05-10 ENCOUNTER — Ambulatory Visit (HOSPITAL_COMMUNITY)
Admission: RE | Admit: 2020-05-10 | Discharge: 2020-05-10 | Disposition: A | Discharge status: Home / Self Care | Payer: PPO | Source: Ambulatory Visit | Attending: Urology | Admitting: Urology

## 2020-05-10 ENCOUNTER — Other Ambulatory Visit: Payer: Self-pay

## 2020-05-10 DIAGNOSIS — N200 Calculus of kidney: Secondary | ICD-10-CM | POA: Insufficient documentation

## 2020-05-10 DIAGNOSIS — N1330 Unspecified hydronephrosis: Secondary | ICD-10-CM | POA: Diagnosis not present

## 2020-05-10 NOTE — Progress Notes (Signed)
Office Visit Note  Patient: Autumn Duran             Date of Birth: April 22, 1950           MRN: [id number]             PCP: [name], MD Referring: [name], MD Visit Date: 05/11/2020   Subjective:   History of Present Illness: Autumn Duran is a 70 y.o. female here for follow up for retroperitoneal fibrosis, currently on prednisone 20 mg PO daily. She decreased as planned by 10 mg increments from a month ago. She developed worsening right flank pain and saw her urology office for this found to have hematuria and prescribed antibiotics, although culture was negative and ultrasound demonstrated multiple renal stones. Currently she is feeling better with improvement of her pain although still has mild left sided tenderness.  Imaging reviewed 04/2020 US Renal Mild right and moderate left hydronephrosis, decreased from prior ultrasound on February 16, 2020. Bilateral nephrolithiasis.  01/2020 US Renal 1. Moderate right and severe left hydronephrosis, progressed from the prior from 02/24/2019. 2. Bilateral nephrolithiasis, as detailed above..    Review of Systems  Constitutional: Negative for fatigue.  HENT: Negative for mouth sores, mouth dryness and nose dryness.   Eyes: Negative for pain, itching and dryness.  Respiratory: Negative for shortness of breath and difficulty breathing.   Cardiovascular: Negative for chest pain and palpitations.  Gastrointestinal: Positive for constipation. Negative for blood in stool and diarrhea.  Endocrine: Negative for increased urination.  Genitourinary: Negative for difficulty urinating and painful urination.  Musculoskeletal: Positive for morning stiffness. Negative for arthralgias, joint pain, joint swelling, myalgias, muscle tenderness and myalgias.  Skin: Negative for color change, rash and redness.  Allergic/Immunologic: Negative for susceptible to infections.  Neurological: Positive for headaches. Negative for dizziness,  numbness, memory loss and weakness.  Hematological: Positive for bruising/bleeding tendency.  Psychiatric/Behavioral: Negative for confusion.    PMFS History:  Patient Active Problem List   Diagnosis Date Noted  . Primary hypercholesterolemia 05/11/2020  . Compression fracture of L1 lumbar vertebra ([place]) 05/11/2020  . Bilateral nephrolithiasis 05/11/2020  . Bilateral hydronephrosis 04/14/2020  . Retroperitoneal fibrosis 03/09/2020  . Degenerative scoliosis 03/05/2020  . Degenerative spondylolisthesis 03/05/2020  . Opioid dependence ([place]) 09/18/2019  . Elevated blood-pressure reading, without diagnosis of hypertension 01/08/2019  . Closed fracture of proximal end of right humerus 06/26/2018  . Paraesophageal hernia 12/12/2017  . Osteoporosis 12/10/2017  . Dysphagia 06/26/2017  . Gastroesophageal reflux disease 06/26/2017  . Hx of colonic polyps 06/26/2017  . Nausea with vomiting 10/12/2014  . MRSA infection   . Osteomyelitis of lumbar spine ([place]) 08/13/2014  . Acute low back pain 08/13/2014  . Epidural abscess 08/13/2014  . Hypertension   . Chronic back pain   . Essential hypertension   . Other iron deficiency anemia 04/10/2014  . 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 Right hip pain 12/23/2013  . Epidural abscess, L2-L5 11/04/2013  . Sepsis ([place]) 09/21/2013  . Hypercalcemia 08/20/2013  . Hypokalemia 08/20/2013  . Lumbar compression fracture ([place]) 07/12/2013  . Compression fracture 07/12/2013  . Benign neoplasm of colon 12/18/2011  . Esophageal reflux 12/18/2011  . Other dysphagia 12/18/2011    Past Medical History:  Diagnosis Date  . Bronchitis, chronic ([place])   . Chronic back pain   . Chronic kidney disease    kidney stone s/p stent placement ( removed)  . Complication of anesthesia   . Compression  fracture of L3 lumbar vertebra   . Diverticulosis   . GERD (gastroesophageal reflux disease)   . Hemorrhoids   . Hiatal hernia   . History of kidney stones   . Hx  of adenomatous colonic polyps   . Hyperlipidemia   . Hypertension   . Lumbar radicular pain   . Osteoporosis 12/10/2017  . PONV (postoperative nausea and vomiting)   . Smoldering myeloma ([place])   . Sore throat   . T7 vertebral fracture (HCC)     Family History  Problem Relation Age of Onset  . Breast cancer Sister 19  . Breast cancer Other 24       aunt  . Hypertension Mother   . COPD Mother   . COPD Father   . Hypertension Sister   . Aortic aneurysm Sister   . Hypertension Son    Past Surgical History:  Procedure Laterality Date  . BACK SURGERY    . BONE MARROW ASPIRATION Left 02/2014  . BONE MARROW BIOPSY Left 02/2014  . CHOLECYSTECTOMY    . COLONOSCOPY  12/18/2011   Procedure: COLONOSCOPY;  Surgeon: Lafayette Dragon, MD;  [facility];  Service: Endoscopy;  Laterality: N/A;  . COLONOSCOPY WITH PROPOFOL N/A 08/03/2017   Procedure: COLONOSCOPY WITH PROPOFOL;  Surgeon: Rogene Houston, MD;  [facility];  Service: Endoscopy;  Laterality: N/A;  . CYSTOSCOPY/RETROGRADE/URETEROSCOPY/STONE EXTRACTION WITH BASKET Bilateral 04/18/2018   Procedure: CYSTOSCOPY/BILATERAL RETROGRADE/URETEROSCOPY/STONE EXTRACTION WITH BASKET WITH RIGHT STENT PLACEMENT;  Surgeon: Cleon Gustin, MD;  [facility];  Service: Urology;  Laterality: Bilateral;  1 HR  . ESOPHAGEAL DILATION N/A 08/03/2017   Procedure: ESOPHAGEAL DILATION;  Surgeon: Rogene Houston, MD;  [facility];  Service: Endoscopy;  Laterality: N/A;  . ESOPHAGOGASTRODUODENOSCOPY  12/18/2011   Procedure: ESOPHAGOGASTRODUODENOSCOPY (EGD);  Surgeon: Lafayette Dragon, MD;  [facility];  Service: Endoscopy;  Laterality: N/A;  . ESOPHAGOGASTRODUODENOSCOPY (EGD) WITH PROPOFOL N/A 08/03/2017   Procedure: ESOPHAGOGASTRODUODENOSCOPY (EGD) WITH PROPOFOL;  Surgeon: Rogene Houston, MD;  [facility];  Service: Endoscopy;  Laterality: N/A;  pt knows to arrive at 6:15  . HERNIA REPAIR    . HOLMIUM  LASER APPLICATION Right 3/76/2831   Procedure: HOLMIUM LASER APPLICATION;  Surgeon: Cleon Gustin, MD;  [facility];  Service: Urology;  Laterality: Right;  . LUMBAR LAMINECTOMY FOR EPIDURAL ABSCESS Left 09/22/2013   Procedure: LUMBAR LAMINECTOMY FOR EPIDURAL ABSCESS LEFT LUMBAR FIVE-SACRAL ONE;  Surgeon: Charlie Pitter, MD;  Location: [place] [facility];  Service: Neurosurgery;  Laterality: Left;  . RADIOLOGY WITH ANESTHESIA N/A 08/13/2014   Procedure: RADIOLOGY WITH ANESTHESIA ;  Surgeon: Medication Radiologist, MD;  Location: [place] [facility];  Service: Radiology;  Laterality: N/A;  . URETERAL STENT PLACEMENT    . VERTEBROPLASTY N/A 07/14/2013   Procedure: VERTEBROPLASTY WITH LUMBAR THREE BIOPSY;  Surgeon: Charlie Pitter, MD;  Location: [place];  Service: Neurosurgery;  Laterality: N/A;  VERTEBROPLASTY WITH LUMBAR THREE BIOPSY   Social History   Social History Narrative  . Not on file   There is no immunization history for the selected administration types on file for this patient.   Objective: Vital Signs: BP (!) 153/77 (BP Location: Right Arm, Patient Position: Sitting, Cuff Size: Normal)   Pulse (!) 58   Resp 13   Ht 5' 1" (1.549 m)   Wt 159 lb 3.2 oz (72.2 kg)   BMI 30.08 kg/m    Physical Exam Abdominal:  General: Abdomen is flat. Bowel sounds are normal. There is no distension.     Palpations: There is no mass.     Tenderness: There is no right CVA tenderness or left CVA tenderness.  Skin:    General: Skin is warm and dry.     Findings: No rash.  Neurological:     Mental Status: She is alert.  Psychiatric:        Mood and Affect: Mood normal.     Investigation: No additional findings.  Imaging: Ultrasound renal complete  Result Date: 05/11/2020 CLINICAL DATA:  Nephrolithiasis. EXAM: RENAL / URINARY TRACT ULTRASOUND COMPLETE COMPARISON:  Renal ultrasound February 24, 2020. FINDINGS: Right Kidney: Renal measurements: 12 x 5.7 x 5.2 cm = volume: 181 mL.  Echogenicity within normal limits. Mild hydronephrosis. Multiple renal stones the largest of which measures 1.5 cm. Left Kidney: Renal measurements: 10 x 4.3 x 4.3 cm = volume: 78 mL. Echogenicity within normal limits. Moderate hydronephrosis. Multiple renal stones. Bladder: Is decompressed. Other: None. IMPRESSION: Mild right and moderate left hydronephrosis, decreased from prior ultrasound on February 16, 2020. Bilateral nephrolithiasis. Electronically Signed   By: Dahlia Bailiff MD   On: 05/11/2020 13:24    Recent Labs: Lab Results  Component Value Date   WBC 6.8 06/12/2019   HGB 12.5 06/12/2019   PLT 317 06/12/2019   NA 140 05/11/2020   K 5.4 (H) 05/11/2020   CL 103 05/11/2020   CO2 31 05/11/2020   GLUCOSE 95 05/11/2020   BUN 24 05/11/2020   CREATININE 0.98 05/11/2020   BILITOT 0.4 06/12/2019   ALKPHOS 79 06/12/2019   AST 16 06/12/2019   ALT 11 06/12/2019   PROT 7.8 06/12/2019   ALBUMIN 3.5 06/12/2019   CALCIUM 10.5 (H) 05/11/2020   GFRAA >60 06/12/2019    Speciality Comments: No specialty comments available.  Procedures:  No procedures performed Allergies: Chlorhexidine gluconate, Morphine and related, and Tetracycline   Assessment / Plan:     Visit Diagnoses: Retroperitoneal fibrosis - Plan: Sedimentation rate  RP on treatment with prednisone we are tapering this with main consideration for the renal function. ESR was highly elevated with response to high dose prednisone checking today for disease activity monitoring. If looks under control continue tapering prednisone down from 20 mg to 0 over next 2 months.  Bilateral hydronephrosis - Plan: Basic Metabolic Panel (BMET)  Hydronephrosis is improved on ultrasound from 3/14 with decreased kidney size, although with increasingly large stones on both sides. This may reflect treatment response. Checking BMP for renal functional monitoring.  Bilateral nephrolithiasis  Reviewed recent results these showed hematuria without  positive urine culture or significant bacteruria. Based on this I question if UTI was causing symptoms or stones primarily. She does remain at increased infection risk with ongoing prednisone use.   Orders: Orders Placed This Encounter  Procedures  . Basic Metabolic Panel (BMET)  . Sedimentation rate   No orders of the defined types were placed in this encounter.    Follow-Up Instructions: Return in about 2 months (around 07/11/2020) for RP f/u on prednisone taper.   Collier Salina, MD  Note - This record has been created using [employer].  Chart creation errors have been sought, but may not always  have been located. Such creation errors do not reflect on  the standard of medical care.

## 2020-05-11 ENCOUNTER — Ambulatory Visit: Payer: PPO | Admitting: Internal Medicine

## 2020-05-11 ENCOUNTER — Encounter: Payer: Self-pay | Admitting: Internal Medicine

## 2020-05-11 VITALS — BP 153/77 | HR 58 | Resp 13 | Ht 61.0 in | Wt 159.2 lb

## 2020-05-11 DIAGNOSIS — N135 Crossing vessel and stricture of ureter without hydronephrosis: Secondary | ICD-10-CM | POA: Diagnosis not present

## 2020-05-11 DIAGNOSIS — S32010A Wedge compression fracture of first lumbar vertebra, initial encounter for closed fracture: Secondary | ICD-10-CM | POA: Insufficient documentation

## 2020-05-11 DIAGNOSIS — N1330 Unspecified hydronephrosis: Secondary | ICD-10-CM | POA: Diagnosis not present

## 2020-05-11 DIAGNOSIS — N200 Calculus of kidney: Secondary | ICD-10-CM

## 2020-05-11 DIAGNOSIS — E7800 Pure hypercholesterolemia, unspecified: Secondary | ICD-10-CM | POA: Insufficient documentation

## 2020-05-12 ENCOUNTER — Ambulatory Visit (HOSPITAL_COMMUNITY): Payer: PPO

## 2020-05-12 LAB — BASIC METABOLIC PANEL
BUN: 24 mg/dL (ref 7–25)
CO2: 31 mmol/L (ref 20–32)
Calcium: 10.5 mg/dL — ABNORMAL HIGH (ref 8.6–10.4)
Chloride: 103 mmol/L (ref 98–110)
Creat: 0.98 mg/dL (ref 0.50–0.99)
Glucose, Bld: 95 mg/dL (ref 65–99)
Potassium: 5.4 mmol/L — ABNORMAL HIGH (ref 3.5–5.3)
Sodium: 140 mmol/L (ref 135–146)

## 2020-05-12 LAB — SEDIMENTATION RATE: Sed Rate: 22 mm/h (ref 0–30)

## 2020-05-13 MED ORDER — PREDNISONE 5 MG PO TABS
ORAL_TABLET | ORAL | 0 refills | Status: AC
Start: 2020-05-13 — End: 2020-06-24

## 2020-05-13 NOTE — Progress Notes (Signed)
Labs look good her kidney function remains improved this matches up with the kidney ultrasound showing decrease in the swelling and fluid retention. Her sedimentation rate is slightly higher than it was on the higher dose prednisone but still a normal amount. She can start tapering down the amount of prednisone like we discussed in clinic, by 5 mg/day per 2 weeks over the next 2 months.

## 2020-05-13 NOTE — Addendum Note (Signed)
Addended by: Collier Salina on: 05/13/2020 10:28 AM   Modules accepted: Orders

## 2020-05-17 ENCOUNTER — Telehealth: Payer: Self-pay

## 2020-05-17 NOTE — Telephone Encounter (Signed)
Patient called and made aware.

## 2020-05-17 NOTE — Telephone Encounter (Signed)
-----   Message from Cleon Gustin, MD sent at 05/17/2020  8:47 AM EDT ----- US shows improvement in the hydronephrosis ----- Message ----- From: Iris Pert, LPN Sent: 5/88/5027   1:45 PM EDT To: Cleon Gustin, MD  Please review

## 2020-05-19 ENCOUNTER — Ambulatory Visit: Payer: PPO | Admitting: Urology

## 2020-05-24 DIAGNOSIS — C9000 Multiple myeloma not having achieved remission: Secondary | ICD-10-CM | POA: Diagnosis not present

## 2020-05-24 DIAGNOSIS — C9030 Solitary plasmacytoma not having achieved remission: Secondary | ICD-10-CM | POA: Diagnosis not present

## 2020-05-24 DIAGNOSIS — Z299 Encounter for prophylactic measures, unspecified: Secondary | ICD-10-CM | POA: Diagnosis not present

## 2020-05-24 DIAGNOSIS — J069 Acute upper respiratory infection, unspecified: Secondary | ICD-10-CM | POA: Diagnosis not present

## 2020-05-24 DIAGNOSIS — I10 Essential (primary) hypertension: Secondary | ICD-10-CM | POA: Diagnosis not present

## 2020-05-31 DIAGNOSIS — T148XXA Other injury of unspecified body region, initial encounter: Secondary | ICD-10-CM | POA: Diagnosis not present

## 2020-06-08 DIAGNOSIS — S32010A Wedge compression fracture of first lumbar vertebra, initial encounter for closed fracture: Secondary | ICD-10-CM | POA: Diagnosis not present

## 2020-06-08 DIAGNOSIS — I10 Essential (primary) hypertension: Secondary | ICD-10-CM | POA: Diagnosis not present

## 2020-06-08 DIAGNOSIS — Z299 Encounter for prophylactic measures, unspecified: Secondary | ICD-10-CM | POA: Diagnosis not present

## 2020-06-29 DIAGNOSIS — Z299 Encounter for prophylactic measures, unspecified: Secondary | ICD-10-CM | POA: Diagnosis not present

## 2020-06-29 DIAGNOSIS — I10 Essential (primary) hypertension: Secondary | ICD-10-CM | POA: Diagnosis not present

## 2020-06-29 DIAGNOSIS — R7309 Other abnormal glucose: Secondary | ICD-10-CM | POA: Diagnosis not present

## 2020-06-29 DIAGNOSIS — N135 Crossing vessel and stricture of ureter without hydronephrosis: Secondary | ICD-10-CM | POA: Diagnosis not present

## 2020-06-29 DIAGNOSIS — R2243 Localized swelling, mass and lump, lower limb, bilateral: Secondary | ICD-10-CM | POA: Diagnosis not present

## 2020-06-29 DIAGNOSIS — Z789 Other specified health status: Secondary | ICD-10-CM | POA: Diagnosis not present

## 2020-07-02 ENCOUNTER — Telehealth: Payer: Self-pay

## 2020-07-02 NOTE — Telephone Encounter (Signed)
Patient called stating she finished her Prednisone about 2 weeks ago and now her feet are swollen and she is experiencing back pain.  Patient states her follow-up appointment isn't until 07/19/20 and is not sure if she should schedule an earlier appointment or if Dr. Benjamine Mola could send another prescription of Prednisone to the pharmacy.

## 2020-07-02 NOTE — Telephone Encounter (Signed)
I spoke with Autumn Duran I recommend she come in sooner to be evaluated since these are new symptoms. She has an upcoming nephrology appointment at 1030 Monday 5/9 I recommended we can add her on-an opening or lunch or overbook-whichever is fine. She is okay if this results in some additional waiting if being added on.

## 2020-07-05 ENCOUNTER — Ambulatory Visit: Payer: PPO | Admitting: Internal Medicine

## 2020-07-05 ENCOUNTER — Other Ambulatory Visit: Payer: Self-pay

## 2020-07-05 ENCOUNTER — Encounter: Payer: Self-pay | Admitting: Internal Medicine

## 2020-07-05 ENCOUNTER — Ambulatory Visit: Payer: PPO | Admitting: Urology

## 2020-07-05 ENCOUNTER — Encounter: Payer: Self-pay | Admitting: Urology

## 2020-07-05 VITALS — BP 163/70 | HR 69 | Resp 16 | Ht 61.0 in | Wt 167.0 lb

## 2020-07-05 VITALS — BP 132/82 | HR 80 | Wt 166.8 lb

## 2020-07-05 DIAGNOSIS — I10 Essential (primary) hypertension: Secondary | ICD-10-CM | POA: Diagnosis not present

## 2020-07-05 DIAGNOSIS — K682 Retroperitoneal fibrosis: Secondary | ICD-10-CM

## 2020-07-05 DIAGNOSIS — N135 Crossing vessel and stricture of ureter without hydronephrosis: Secondary | ICD-10-CM | POA: Diagnosis not present

## 2020-07-05 DIAGNOSIS — I83893 Varicose veins of bilateral lower extremities with other complications: Secondary | ICD-10-CM | POA: Diagnosis not present

## 2020-07-05 DIAGNOSIS — N200 Calculus of kidney: Secondary | ICD-10-CM | POA: Diagnosis not present

## 2020-07-05 DIAGNOSIS — N1330 Unspecified hydronephrosis: Secondary | ICD-10-CM | POA: Diagnosis not present

## 2020-07-05 DIAGNOSIS — S32000S Wedge compression fracture of unspecified lumbar vertebra, sequela: Secondary | ICD-10-CM | POA: Diagnosis not present

## 2020-07-05 LAB — URINALYSIS, ROUTINE W REFLEX MICROSCOPIC
Bilirubin, UA: NEGATIVE
Glucose, UA: NEGATIVE
Nitrite, UA: NEGATIVE
Specific Gravity, UA: 1.02 (ref 1.005–1.030)
Urobilinogen, Ur: 0.2 mg/dL (ref 0.2–1.0)
pH, UA: 7 (ref 5.0–7.5)

## 2020-07-05 LAB — MICROSCOPIC EXAMINATION
Epithelial Cells (non renal): >10 /hpf — AB (ref 0–10)
Renal Epithel, UA: NONE SEEN /hpf

## 2020-07-05 NOTE — Progress Notes (Signed)
Urological Symptom Review  Patient is experiencing the following symptoms: none   Review of Systems  Gastrointestinal (upper)  : Nausea  Gastrointestinal (lower) : Constipation  Constitutional : Negative for symptoms  Skin: Negative for skin symptoms  Eyes: Blurred vision  Ear/Nose/Throat : Sinus problems  Hematologic/Lymphatic: Negative for Hematologic/Lymphatic symptoms  Cardiovascular : Leg swelling  Respiratory : Cough  Endocrine: Negative for endocrine symptoms  Musculoskeletal: Back pain  Neurological: Headaches  Psychologic: Negative for psychiatric symptoms

## 2020-07-05 NOTE — Progress Notes (Signed)
Office Visit Note  Patient: Autumn Duran             Date of Birth: Sep 29, 1950           MRN: [id number]             PCP: [name], MD Referring: [name], MD Visit Date: 07/05/2020   Subjective:  Follow-up (Not doing good, bil feet and leg swelling, patient has not had COVID vaccines)   History of Present Illness: Autumn Duran is a 70 y.o. female here for follow up for retroperitoneal fibrosis with report of increased back and feet pain and swelling after discontinuing prednisone taper.  She also describes noticing a lot of mouth dryness.  She has not experienced a recurrence of the flank pain on her right side as she had before.  Her chronic low back pain around the compression fracture area is a bit worse off the prednisone.  She discussed her progress with her nephrologist noting her very large kidney stones and previous hydronephrosis and scheduled for repeat renal ultrasound next week to reassess this.   Review of Systems  Constitutional: Positive for fatigue.  HENT: Positive for mouth dryness.   Eyes: Positive for dryness.  Respiratory: Negative for shortness of breath.   Cardiovascular: Positive for swelling in legs/feet.  Gastrointestinal: Positive for constipation.  Endocrine: Positive for cold intolerance, heat intolerance and excessive thirst.  Genitourinary: Negative for difficulty urinating.  Musculoskeletal: Positive for joint swelling, morning stiffness and muscle tenderness.  Skin: Negative for rash.  Allergic/Immunologic: Positive for susceptible to infections.  Neurological: Negative for numbness.  Hematological: Negative for bruising/bleeding tendency.  Psychiatric/Behavioral: Positive for sleep disturbance.    PMFS History:  Patient Active Problem List   Diagnosis Date Noted  . Varicose veins of leg with swelling, bilateral 07/05/2020  . Primary hypercholesterolemia 05/11/2020  . Compression fracture of L1 lumbar vertebra ([place]) 05/11/2020   . Bilateral nephrolithiasis 05/11/2020  . Bilateral hydronephrosis 04/14/2020  . Retroperitoneal fibrosis 03/09/2020  . Degenerative scoliosis 03/05/2020  . Degenerative spondylolisthesis 03/05/2020  . Opioid dependence ([place]) 09/18/2019  . Elevated blood-pressure reading, without diagnosis of hypertension 01/08/2019  . Closed fracture of proximal end of right humerus 06/26/2018  . Paraesophageal hernia 12/12/2017  . Osteoporosis 12/10/2017  . Dysphagia 06/26/2017  . Gastroesophageal reflux disease 06/26/2017  . Hx of colonic polyps 06/26/2017  . Nausea with vomiting 10/12/2014  . MRSA infection   . Acute low back pain 08/13/2014  . Epidural abscess 08/13/2014  . Hypertension   . Chronic back pain   . Essential hypertension   . Other iron deficiency anemia 04/10/2014  . 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 Right hip pain 12/23/2013  . Epidural abscess, L2-L5 11/04/2013  . Sepsis ([place]) 09/21/2013  . Hypercalcemia 08/20/2013  . Hypokalemia 08/20/2013  . Lumbar compression fracture ([place]) 07/12/2013  . Compression fracture 07/12/2013  . Benign neoplasm of colon 12/18/2011  . Esophageal reflux 12/18/2011  . Other dysphagia 12/18/2011    Past Medical History:  Diagnosis Date  . Bronchitis, chronic ([place])   . Chronic back pain   . Chronic kidney disease    kidney stone s/p stent placement ( removed)  . Complication of anesthesia   . Compression fracture of L3 lumbar vertebra   . Diverticulosis   . GERD (gastroesophageal reflux disease)   . Hemorrhoids   . Hiatal hernia   . History of kidney stones   . Hx of adenomatous colonic polyps   .  Hyperlipidemia   . Hypertension   . Lumbar radicular pain   . Osteoporosis 12/10/2017  . PONV (postoperative nausea and vomiting)   . Smoldering myeloma ([place])   . Sore throat   . T7 vertebral fracture ([facility])     Family History  Problem Relation Age of Onset  . Breast cancer Sister 19  . Breast cancer Other 18       aunt  .  Hypertension Mother   . COPD Mother   . COPD Father   . Hypertension Sister   . Aortic aneurysm Sister   . Hypertension Son    Past Surgical History:  Procedure Laterality Date  . BACK SURGERY    . BONE MARROW ASPIRATION Left 02/2014  . BONE MARROW BIOPSY Left 02/2014  . CHOLECYSTECTOMY    . COLONOSCOPY  12/18/2011   Procedure: COLONOSCOPY;  Surgeon: Lafayette Dragon, MD;  Location: [facility];  Service: Endoscopy;  Laterality: N/A;  . COLONOSCOPY WITH PROPOFOL N/A 08/03/2017   Procedure: COLONOSCOPY WITH PROPOFOL;  Surgeon: Rogene Houston, MD;  Location: [facility];  Service: Endoscopy;  Laterality: N/A;  . CYSTOSCOPY/RETROGRADE/URETEROSCOPY/STONE EXTRACTION WITH BASKET Bilateral 04/18/2018   Procedure: CYSTOSCOPY/BILATERAL RETROGRADE/URETEROSCOPY/STONE EXTRACTION WITH BASKET WITH RIGHT STENT PLACEMENT;  Surgeon: Cleon Gustin, MD;  Location: [facility];  Service: Urology;  Laterality: Bilateral;  1 HR  . ESOPHAGEAL DILATION N/A 08/03/2017   Procedure: ESOPHAGEAL DILATION;  Surgeon: Rogene Houston, MD;  Location: [facility];  Service: Endoscopy;  Laterality: N/A;  . ESOPHAGOGASTRODUODENOSCOPY  12/18/2011   Procedure: ESOPHAGOGASTRODUODENOSCOPY (EGD);  Surgeon: Lafayette Dragon, MD;  Location: [facility];  Service: Endoscopy;  Laterality: N/A;  . ESOPHAGOGASTRODUODENOSCOPY (EGD) WITH PROPOFOL N/A 08/03/2017   Procedure: ESOPHAGOGASTRODUODENOSCOPY (EGD) WITH PROPOFOL;  Surgeon: Rogene Houston, MD;  [facility]: [facility];  Service: Endoscopy;  Laterality: N/A;  pt knows to arrive at 6:15  . HERNIA REPAIR    . HOLMIUM LASER APPLICATION Right 8/81/1031   Procedure: HOLMIUM LASER APPLICATION;  Surgeon: Cleon Gustin, MD;  Location: [facility];  Service: Urology;  Laterality: Right;  . LUMBAR LAMINECTOMY FOR EPIDURAL ABSCESS Left 09/22/2013   Procedure: LUMBAR LAMINECTOMY FOR EPIDURAL ABSCESS LEFT LUMBAR FIVE-SACRAL ONE;  Surgeon: Charlie Pitter, MD;  Location: [facility];  Service:  Neurosurgery;  Laterality: Left;  . RADIOLOGY WITH ANESTHESIA N/A 08/13/2014   Procedure: RADIOLOGY WITH ANESTHESIA ;  Surgeon: Medication Radiologist, MD;  Location: [facility];  Service: Radiology;  Laterality: N/A;  . URETERAL STENT PLACEMENT    . VERTEBROPLASTY N/A 07/14/2013   Procedure: VERTEBROPLASTY WITH LUMBAR THREE BIOPSY;  Surgeon: Charlie Pitter, MD;  Location: [facility];  Service: Neurosurgery;  Laterality: N/A;  VERTEBROPLASTY WITH LUMBAR THREE BIOPSY   Social History   Social History Narrative  . Not on file   There is no immunization history for the selected administration types on file for this patient.   Objective: Vital Signs: BP (!) 163/70 (BP Location: Left Arm, Patient Position: Sitting, Cuff Size: Normal)   Pulse 69   Resp 16   Ht '5\' 1"'  (1.549 m)   Wt 167 lb (75.8 kg)   BMI 31.55 kg/m    Physical Exam Constitutional:      Appearance: She is obese.  HENT:     Mouth/Throat:     Mouth: Mucous membranes are moist.     Pharynx: Oropharynx is clear.  Eyes:     Conjunctiva/sclera: Conjunctivae normal.  Abdominal:  General: There is no distension.     Tenderness: There is no abdominal tenderness. There is no right CVA tenderness, left CVA tenderness, guarding or rebound.  Musculoskeletal:     Right lower leg: Edema present.     Left lower leg: Edema present.  Skin:    General: Skin is warm and dry.     Findings: No rash.  Neurological:     General: No focal deficit present.     Mental Status: She is alert.  Psychiatric:        Mood and Affect: Mood normal.      Musculoskeletal Exam:  Elbows full ROM no tenderness or swelling Wrists full ROM no tenderness or swelling Fingers full ROM no tenderness or swelling Knees full ROM no tenderness or swelling Ankles full ROM no tenderness, overlying swelling in legs   Investigation: No additional findings.  Imaging: No results found.  Recent Labs: Lab Results  Component Value Date   WBC 7.2  07/05/2020   HGB 12.5 07/05/2020   PLT 487 (H) 07/05/2020   NA 141 07/05/2020   K 4.5 07/05/2020   CL 104 07/05/2020   CO2 30 07/05/2020   GLUCOSE 81 07/05/2020   BUN 10 07/05/2020   CREATININE 1.07 (H) 07/05/2020   BILITOT 0.5 07/05/2020   ALKPHOS 79 06/12/2019   AST 14 07/05/2020   ALT 7 07/05/2020   PROT 6.3 07/05/2020   ALBUMIN 3.5 06/12/2019   CALCIUM 10.1 07/05/2020   GFRAA 61 07/05/2020    Speciality Comments: No specialty comments available.  Procedures:  No procedures performed Allergies: Chlorhexidine gluconate, Morphine and related, and Tetracycline   Assessment / Plan:     Visit Diagnoses: Retroperitoneal fibrosis - Plan: Sedimentation rate, C-reactive protein, CBC with Differential/Platelet, COMPLETE METABOLIC PANEL WITH GFR, predniSONE (DELTASONE) 20 MG tablet  Retroperitoneal fibrosis diagnosed by imaging findings with compressive effect on bilateral kidneys and highly elevated inflammatory markers she is tapered off prednisone completely at this time.  Symptoms and labs are well controlled 2 months ago on 20 mg dose prednisone.  I suspect her low back pain is more related to compression fracture issues.  We will recheck labs today with ESR CRP and CMP for evidence of return of inflammation.  Bilateral hydronephrosis - Plan: CBC with Differential/Platelet, COMPLETE METABOLIC PANEL WITH GFR  Bilateral renal obstruction with enlargement since last year with previous increase in serum creatinine this was normalized and with some decrease of the hydronephrosis on interval ultrasound.  She is planned for reassessment next week.  Also rechecking CMP today.  Compression fracture of lumbar vertebra, unspecified lumbar vertebral level, sequela  I suspect current low back pain is more related to chronic degenerative change and injury in the back rather than flare of inflammatory disease but working up as detailed above.  Primary hypertension Varicose veins of leg with  swelling, bilateral  Mild lower extremity edema bilaterally uncertain etiology.  She does have venous varicosities question if there is any insufficiency.  Could have some secondary right heart failure with hypertension.  Could have fluid retention with hydronephrosis issue.  No venous stasis dermatitis or lesions are present.  Discussed leg elevation and compressive sock use as initial option for the edema.  Orders: Orders Placed This Encounter  Procedures  . Sedimentation rate  . C-reactive protein  . CBC with Differential/Platelet  . COMPLETE METABOLIC PANEL WITH GFR   Meds ordered this encounter  Medications  . predniSONE (DELTASONE) 20 MG tablet    Sig:  Take 1 tablet (20 mg total) by mouth daily with breakfast.    Dispense:  30 tablet    Refill:  0     Follow-Up Instructions: No follow-ups on file.   Collier Salina, MD  Note - This record has been created using [employer].  Chart creation errors have been sought, but may not always  have been located. Such creation errors do not reflect on  the standard of medical care.

## 2020-07-05 NOTE — Progress Notes (Signed)
07/05/2020 11:34 AM   Autumn Duran 1951/02/06 [id number]  Referring provider: [name], MD [place],  [place] [address]  followup hydronephrosis and nephrolithiasis  HPI: [name] is 24MQ here for followup for nephrolithiasis and hydronephrosis. Renal US 3/15 showed improvement in here right and left hydronephrosis. She has stable bilateral renal calculi. She has new bilateral lower extremity edema. She has good urination in the morning but then has multiple small volume voids throughout the day   PMH: Past Medical History:  Diagnosis Date  . Bronchitis, chronic ([place])   . Chronic back pain   . Chronic kidney disease    kidney stone s/p stent placement ( removed)  . Complication of anesthesia   . Compression fracture of L3 lumbar vertebra   . Diverticulosis   . GERD (gastroesophageal reflux disease)   . Hemorrhoids   . Hiatal hernia   . History of kidney stones   . Hx of adenomatous colonic polyps   . Hyperlipidemia   . Hypertension   . Lumbar radicular pain   . Osteoporosis 12/10/2017  . PONV (postoperative nausea and vomiting)   . Smoldering myeloma ([place])   . Sore throat   . T7 vertebral fracture [facility])     Surgical History: Past Surgical History:  Procedure Laterality Date  . BACK SURGERY    . BONE MARROW ASPIRATION Left 02/2014  . BONE MARROW BIOPSY Left 02/2014  . CHOLECYSTECTOMY    . COLONOSCOPY  12/18/2011   Procedure: COLONOSCOPY;  Surgeon: Lafayette Dragon, MD;  Location: [facility] ENDOS[facility];  Service: Endoscopy;  Laterality: N/A;  . COLONOSCOPY WITH PROPOFOL N/A 08/03/2017   Procedure: COLONOSCOPY WITH PROPOFOL;  Surgeon: Rogene Houston, MD;  [facility];  Service: Endoscopy;  Laterality: N/A;  . CYSTOSCOPY/RETROGRADE/URETEROSCOPY/STONE EXTRACTION WITH BASKET Bilateral 04/18/2018   Procedure: CYSTOSCOPY/BILATERAL RETROGRADE/URETEROSCOPY/STONE EXTRACTION WITH BASKET WITH RIGHT STENT PLACEMENT;  Surgeon: Cleon Gustin, MD;  Location: [facility]  ORS;  Service: Urology;  Laterality: Bilateral;  1 HR  . ESOPHAGEAL DILATION N/A 08/03/2017   Procedure: ESOPHAGEAL DILATION;  Surgeon: Rogene Houston, MD;  [facility];  Service: Endoscopy;  Laterality: N/A;  . ESOPHAGOGASTRODUODENOSCOPY  12/18/2011   Procedure: ESOPHAGOGASTRODUODENOSCOPY (EGD);  Surgeon: Lafayette Dragon, MD;  Location: [facility];  Service: Endoscopy;  Laterality: N/A;  . ESOPHAGOGASTRODUODENOSCOPY (EGD) WITH PROPOFOL N/A 08/03/2017   Procedure: ESOPHAGOGASTRODUODENOSCOPY (EGD) WITH PROPOFOL;  Surgeon: Rogene Houston, MD;  [facility];  Service: Endoscopy;  Laterality: N/A;  pt knows to arrive at 6:15  . HERNIA REPAIR    . HOLMIUM LASER APPLICATION Right [id number]/3817   Procedure: HOLMIUM LASER APPLICATION;  Surgeon: Cleon Gustin, MD;  Location: [facility] ORS;  Service: Urology;  Laterality: Right;  . LUMBAR LAMINECTOMY FOR EPIDURAL ABSCESS Left 09/22/2013   Procedure: LUMBAR LAMINECTOMY FOR EPIDURAL ABSCESS LEFT LUMBAR FIVE-SACRAL ONE;  Surgeon: Charlie Pitter, MD;  Location: [facility]URO ORS;  Service: Neurosurgery;  Laterality: Left;  . RADIOLOGY WITH ANESTHESIA N/A 08/13/2014   Procedure: RADIOLOGY WITH ANESTHESIA ;  Surgeon: Medication Radiologist, MD;  Location: [place] NEURO ORS;  Service: Radiology;  Laterality: N/A;  . URETERAL STENT PLACEMENT    . VERTEBROPLASTY N/A 07/14/2013   Procedure: VERTEBROPLASTY WITH LUMBAR THREE BIOPSY;  Surgeon: Charlie Pitter, MD;  Location: [place] [facility] NEURO ORS;  Service: Neurosurgery;  Laterality: N/A;  VERTEBROPLASTY WITH LUMBAR THREE BIOPSY    Home Medications:  Allergies as of 07/05/2020  Reactions   Chlorhexidine Gluconate Itching   Pt c/o itching after abd wipes.    Morphine And Related Nausea And Vomiting   Headache   Tetracycline Nausea Only      Medication List       Accurate as of Jul 05, 2020 11:34 AM. If you have any questions, ask your nurse or doctor.        STOP taking these medications   nitrofurantoin  (macrocrystal-monohydrate) 100 MG capsule Commonly known as: MACROBID Stopped by: [name], MD   ondansetron 4 MG disintegrating tablet Commonly known as: ZOFRAN-ODT Stopped by: [name], MD     TAKE these medications   Azelastine HCl 0.15 % Soln Place 1 spray into both nostrils daily.   ergocalciferol 1.25 MG (50000 UT) capsule Commonly known as: VITAMIN D2 Take 1 capsule (50,000 Units total) by mouth once a week.   HYDROcodone-acetaminophen 5-325 MG tablet Commonly known as: NORCO/VICODIN as needed.   ibuprofen 200 MG tablet Commonly known as: ADVIL Take 2 tablets (400 mg total) by mouth daily as needed for mild pain.   omeprazole 20 MG capsule Commonly known as: PRILOSEC Take 20 mg by mouth 2 (two) times daily before a meal.   oxyCODONE 20 mg 12 hr tablet Commonly known as: OXYCONTIN Take 20 mg by mouth daily.   polyethylene glycol powder 17 GM/SCOOP powder Commonly known as: GLYCOLAX/MIRALAX Take by mouth.   THERATEARS OP Place 1 drop into both eyes daily as needed (dry eyes).       Allergies:  Allergies  Allergen Reactions  . Chlorhexidine Gluconate Itching    Pt c/o itching after abd wipes.   . Morphine And Related Nausea And Vomiting    Headache  . Tetracycline Nausea Only    Family History: Family History  Problem Relation Age of Onset  . Breast cancer Sister 58  . Breast cancer Other 37       aunt  . Hypertension Mother   . COPD Mother   . COPD Father   . Hypertension Sister   . Aortic aneurysm Sister   . Hypertension Son     Social History:  reports that she has never smoked. She has never used smokeless tobacco. She reports current alcohol use. She reports that she does not use drugs.  ROS: All other review of systems were reviewed and are negative except what is noted above in HPI  Physical Exam: BP 132/82   Pulse 80   Wt 166 lb 12.8 oz (75.7 kg)   BMI 31.52 kg/m   Constitutional:  Alert and oriented, No acute  distress. HEENT:  AT, moist mucus membranes.  Trachea midline, no masses. Cardiovascular: No clubbing, cyanosis, or edema. Respiratory: Normal respiratory effort, no increased work of breathing. GI: Abdomen is soft, nontender, nondistended, no abdominal masses GU: No CVA tenderness.  Lymph: No cervical or inguinal lymphadenopathy. Skin: No rashes, bruises or suspicious lesions. Neurologic: Grossly intact, no focal deficits, moving all 4 extremities. Psychiatric: Normal mood and affect.  Laboratory Data: Lab Results  Component Value Date   WBC 6.8 06/12/2019   HGB 12.5 06/12/2019   HCT 39.9 06/12/2019   MCV 86.9 06/12/2019   PLT 317 06/12/2019    Lab Results  Component Value Date   CREATININE 0.98 05/11/2020    No results found for: PSA  No results found for: TESTOSTERONE  No results found for: HGBA1C  Urinalysis    Component Value Date/Time   COLORURINE YELLOW 08/13/2014 0850  APPEARANCEUR Clear 04/28/2020 1118   LABSPEC 1.017 08/13/2014 0850   PHURINE 6.5 08/13/2014 0850   GLUCOSEU Negative 04/28/2020 1118   HGBUR NEGATIVE 08/13/2014 0850   BILIRUBINUR Negative 04/28/2020 1118   KETONESUR >80 (A) 08/13/2014 0850   PROTEINUR Negative 04/28/2020 1118   PROTEINUR NEGATIVE 08/13/2014 0850   UROBILINOGEN 0.2 08/13/2014 0850   NITRITE Negative 04/28/2020 1118   NITRITE NEGATIVE 08/13/2014 0850   LEUKOCYTESUR 1+ (A) 04/28/2020 1118    Lab Results  Component Value Date   LABMICR See below: 04/28/2020   WBCUA 6-10 (A) 04/28/2020   LABEPIT >10 (A) 04/28/2020   BACTERIA Few (A) 04/28/2020    Pertinent Imaging: Renal US 05/10/2020: Images reviewed and discussed with the patient No results found for this or any previous visit.  Results for orders placed during the hospital encounter of 12/18/13  US Venous Img Lower Bilateral  Narrative CLINICAL DATA:  Leg swelling  EXAM: BILATERAL LOWER EXTREMITY VENOUS DOPPLER ULTRASOUND  TECHNIQUE: Gray-scale  sonography with graded compression, as well as color Doppler and duplex ultrasound were performed to evaluate the lower extremity deep venous systems from the level of the common femoral vein and including the common femoral, femoral, profunda femoral, popliteal and calf veins including the posterior tibial, peroneal and gastrocnemius veins when visible. The superficial great saphenous vein was also interrogated. Spectral Doppler was utilized to evaluate flow at rest and with distal augmentation maneuvers in the common femoral, femoral and popliteal veins.  COMPARISON:  None.  FINDINGS: RIGHT LOWER EXTREMITY  Common Femoral Vein: No evidence of thrombus. Normal compressibility, respiratory phasicity and response to augmentation.  Saphenofemoral Junction: No evidence of thrombus. Normal compressibility and flow on color Doppler imaging.  Profunda Femoral Vein: No evidence of thrombus. Normal compressibility and flow on color Doppler imaging.  Femoral Vein: No evidence of thrombus. Normal compressibility, respiratory phasicity and response to augmentation.  Popliteal Vein: No evidence of thrombus. Normal compressibility, respiratory phasicity and response to augmentation.  Calf Veins: No evidence of thrombus. Normal compressibility and flow on color Doppler imaging.  Superficial Great Saphenous Vein: No evidence of thrombus. Normal compressibility and flow on color Doppler imaging.  Venous Reflux:  None.  Other Findings:  None.  LEFT LOWER EXTREMITY  Common Femoral Vein: No evidence of thrombus. Normal compressibility, respiratory phasicity and response to augmentation.  Saphenofemoral Junction: No evidence of thrombus. Normal compressibility and flow on color Doppler imaging.  Profunda Femoral Vein: No evidence of thrombus. Normal compressibility and flow on color Doppler imaging.  Femoral Vein: No evidence of thrombus. Normal compressibility, respiratory phasicity  and response to augmentation.  Popliteal Vein: No evidence of thrombus. Normal compressibility, respiratory phasicity and response to augmentation.  Calf Veins: Not well visualized.  Superficial Great Saphenous Vein: No evidence of thrombus. Normal compressibility and flow on color Doppler imaging.  Venous Reflux:  None.  Other Findings:  None.  IMPRESSION: No evidence of deep venous thrombosis.   Electronically Signed By: Kathreen Devoid On: 12/18/2013 13:37  No results found for this or any previous visit.  No results found for this or any previous visit.  Results for orders placed during the hospital encounter of 05/10/20  Ultrasound renal complete  Narrative CLINICAL DATA:  Nephrolithiasis.  EXAM: RENAL / URINARY TRACT ULTRASOUND COMPLETE  COMPARISON:  Renal ultrasound February 24, 2020.  FINDINGS: Right Kidney:  Renal measurements: 12 x 5.7 x 5.2 cm = volume: 181 mL. Echogenicity within normal limits. Mild hydronephrosis. Multiple renal stones the largest  of which measures 1.5 cm.  Left Kidney:  Renal measurements: 10 x 4.3 x 4.3 cm = volume: 78 mL. Echogenicity within normal limits. Moderate hydronephrosis. Multiple renal stones.  Bladder:  Is decompressed.  Other:  None.  IMPRESSION: Mild right and moderate left hydronephrosis, decreased from prior ultrasound on February 16, 2020.  Bilateral nephrolithiasis.   Electronically Signed By: Dahlia Bailiff MD On: 05/11/2020 13:24  No results found for this or any previous visit.  No results found for this or any previous visit.  Results for orders placed during the hospital encounter of 02/26/19  CT RENAL STONE STUDY  Narrative CLINICAL DATA:  Right lower quadrant and right flank pain. History of nephrolithiasis. Prior cholecystectomy.  EXAM: CT ABDOMEN AND PELVIS WITHOUT CONTRAST  TECHNIQUE: Multidetector CT imaging of the abdomen and pelvis was performed following the standard  protocol without IV contrast.  COMPARISON:  02/24/2019 renal sonogram. 04/17/2018 unenhanced CT abdomen/pelvis.  FINDINGS: Lower chest: Mild right middle lobe and lingular and moderate left lower lobe cylindrical bronchiectasis with associated thick bandlike scarring, similar to slightly worsened in the interval. Right lower lobe 3 mm solid pulmonary nodule (series 4/image 3), stable, considered benign.  Hepatobiliary: Normal liver size. Simple 2.5 cm posterior left liver lobe cyst. No additional liver lesions. Punctate granulomatous right liver dome calcification is unchanged. Cholecystectomy. Stable dilated CBD, 8 mm diameter, within normal post cholecystectomy limits, without significant intrahepatic biliary ductal dilatation.  Pancreas: Normal, with no mass or duct dilation.  Spleen: Normal size. No mass.  Adrenals/Urinary Tract: Normal adrenals. Nonobstructing 2 mm upper, 3 mm interpolar and 3 mm lower right renal stones. Nonobstructing 1 mm upper left renal stones. Subcentimeter hypodense renal cortical lesion in the medial upper right kidney is too small to characterize and is unchanged, considered benign. No contour deforming renal masses. Mild right hydronephrosis with moderately dilated right extrarenal pelvis with somewhat abrupt caliber transition at the right ureteropelvic junction, increased since 08/10/2018 CT. Moderate left hydronephrosis with abrupt caliber transition at the left ureteropelvic junction, mildly increased since 08/10/2018 CT. Normal caliber ureters. No UPJ or ureteral stones. Normal bladder, with no bladder stones.  Stomach/Bowel: Curvilinear hyperdensity adjacent to the esophagogastric junction. Stomach is nondistended with no acute gastric abnormality. Normal caliber small bowel with no small bowel wall thickening. Normal appendix. Normal large bowel with no diverticulosis, large bowel wall thickening or pericolonic  fat stranding.  Vascular/Lymphatic: Atherosclerotic nonaneurysmal abdominal aorta. There is worsening fat stranding throughout the retroperitoneum surrounding the IVC and abdominal aorta and proximal ureters bilaterally. No pathologically enlarged lymph nodes in the abdomen or pelvis.  Reproductive: Grossly normal uterus. No adnexal mass. Bilateral tubal ligation clips in place.  Other: No pneumoperitoneum, ascites or focal fluid collection.  Musculoskeletal: No aggressive appearing focal osseous lesions. Severe degenerative disc disease at L3-4. Vertebroplasty material again noted within mild-to-moderate L3 vertebral compression fracture. Prominent bilateral lower lumbar facet arthropathy with stable grade 1 anterolisthesis at L5-S1.  IMPRESSION: 1. 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 2. Mild right and moderate left hydronephrosis with abrupt caliber transitions at the UPJ bilaterally, without evidence of obstructing stone or mass on this noncontrast scan. Findings could represent progressive proximal ureteral obstruction due to retroperitoneal fibrosis. 3. Tiny nonobstructing stones in both kidneys. 4. Mild to moderate bronchiectasis and thick bandlike scarring at both lung bases, similar to slightly worsened. 5.  Aortic Atherosclerosis (ICD10-I70.0).   Electronically Signed  By: Ilona Sorrel M.D. On: 02/26/2019 15:50   Assessment & Plan:    1. Nephrolithiasis -Renal US, will call with results. If it shows stable/improvement in her hydronephrosis I will see her back in 3 months with a renal US - Urinalysis, Routine w reflex microscopic - Urine Culture - US RENAL   No follow-ups on file.  [name], MD  [facility] [place]

## 2020-07-05 NOTE — Patient Instructions (Signed)
Hydronephrosis  Hydronephrosis is the swelling of one or both kidneys due to a blockage that stops urine from flowing out of the body. Kidneys filter waste from the blood and produce urine. This condition can lead to kidney failure and may become life-threatening if not treated promptly. What are the causes? In infants and children, common causes include problems that occur when a baby is developing in the womb. These can include problems in the kidneys or in the tubes that drain urine into the bladder (ureters). In adults, common causes include:  Kidney stones.  Pregnancy.  A tumor or cyst in the abdomen or pelvis.  An enlarged prostate gland. Other causes include:  Bladder infection.  Scar tissue from a previous surgery or injury.  A blood clot.  Cancer of the prostate, bladder, uterus, ovary, or colon. What are the signs or symptoms? Symptoms of this condition include:  Pain or discomfort in your side (flank) or abdomen.  Swelling in your abdomen.  Nausea and vomiting.  Fever.  Pain when passing urine.  Feelings of urgency when you need to urinate.  Urinating more often than normal. In some cases, you may not have any symptoms. How is this diagnosed? This condition may be diagnosed based on:  Your symptoms and medical history.  A physical exam.  Blood and urine tests.  Imaging tests, such as an ultrasound, CT scan, or MRI.  A procedure to look at your urinary tract and bladder by inserting a scope into the urethra (cystoscopy). How is this treated? Treatment for this condition depends on where the blockage is, how long it has been there, and what caused it. The goal of treatment is to remove the blockage. Treatment may include:  Antibiotic medicines to treat or prevent infection.  A procedure to place a small, thin tube (stent) into a blocked ureter. The stent will keep the ureter open so that urine can drain through it.  A nonsurgical procedure that  crushes kidney stones with shock waves (extracorporeal shock wave lithotripsy).  If kidney failure occurs, treatment may include dialysis or a kidney transplant. Follow these instructions at home:  Take over-the-counter and prescription medicines only as told by your health care provider.  If you were prescribed an antibiotic medicine, take it exactly as told by your health care provider. Do not stop taking the antibiotic even if you start to feel better.  Rest and return to your normal activities as told by your health care provider. Ask your health care provider what activities are safe for you.  Drink enough fluid to keep your urine pale yellow.  Keep all follow-up visits. This is important.   Contact a health care provider if:  You continue to have symptoms after treatment.  You develop new symptoms.  Your urine becomes cloudy or bloody.  You have a fever. Get help right away if:  You have severe flank or abdominal pain.  You cannot drink fluids without vomiting. Summary  Hydronephrosis is the swelling of one or both kidneys due to a blockage that stops urine from flowing out of the body.  Hydronephrosis can lead to kidney failure and may become life-threatening if not treated promptly.  The goal of treatment is to remove the blockage. It may include a procedure to insert a stent into a blocked ureter, a procedure to break up kidney stones, or taking antibiotic medicines.  Follow your health care provider's instructions for taking care of yourself at home, including instructions about drinking fluids, taking   medicines, and limiting activities. This information is not intended to replace advice given to you by your health care provider. Make sure you discuss any questions you have with your health care provider. Document Revised: 06/03/2019 Document Reviewed: 06/03/2019 Elsevier Patient Education  2021 [employer]  

## 2020-07-06 LAB — CBC WITH DIFFERENTIAL/PLATELET
Absolute Monocytes: 490 cells/uL (ref 200–950)
Basophils Absolute: 72 cells/uL (ref 0–200)
Basophils Relative: 1 %
Eosinophils Absolute: 173 cells/uL (ref 15–500)
Eosinophils Relative: 2.4 %
HCT: 38.1 % (ref 35.0–45.0)
Hemoglobin: 12.5 g/dL (ref 11.7–15.5)
Lymphs Abs: 1526 cells/uL (ref 850–3900)
MCH: 28.7 pg (ref 27.0–33.0)
MCHC: 32.8 g/dL (ref 32.0–36.0)
MCV: 87.6 fL (ref 80.0–100.0)
MPV: 9.6 fL (ref 7.5–12.5)
Monocytes Relative: 6.8 %
Neutro Abs: 4939 cells/uL (ref 1500–7800)
Neutrophils Relative %: 68.6 %
Platelets: 487 10*3/uL — ABNORMAL HIGH (ref 140–400)
RBC: 4.35 10*6/uL (ref 3.80–5.10)
RDW: 13.4 % (ref 11.0–15.0)
Total Lymphocyte: 21.2 %
WBC: 7.2 10*3/uL (ref 3.8–10.8)

## 2020-07-06 LAB — COMPLETE METABOLIC PANEL WITH GFR
AG Ratio: 1.3 (calc) (ref 1.0–2.5)
ALT: 7 U/L (ref 6–29)
AST: 14 U/L (ref 10–35)
Albumin: 3.6 g/dL (ref 3.6–5.1)
Alkaline phosphatase (APISO): 61 U/L (ref 37–153)
BUN/Creatinine Ratio: 9 (calc) (ref 6–22)
BUN: 10 mg/dL (ref 7–25)
CO2: 30 mmol/L (ref 20–32)
Calcium: 10.1 mg/dL (ref 8.6–10.4)
Chloride: 104 mmol/L (ref 98–110)
Creat: 1.07 mg/dL — ABNORMAL HIGH (ref 0.50–0.99)
GFR, Est African American: 61 mL/min/{1.73_m2} (ref >=60)
GFR, Est Non African American: 53 mL/min/{1.73_m2} — ABNORMAL LOW (ref >=60)
Globulin: 2.7 g/dL (calc) (ref 1.9–3.7)
Glucose, Bld: 81 mg/dL (ref 65–99)
Potassium: 4.5 mmol/L (ref 3.5–5.3)
Sodium: 141 mmol/L (ref 135–146)
Total Bilirubin: 0.5 mg/dL (ref 0.2–1.2)
Total Protein: 6.3 g/dL (ref 6.1–8.1)

## 2020-07-06 LAB — SEDIMENTATION RATE: Sed Rate: 89 mm/h — ABNORMAL HIGH (ref 0–30)

## 2020-07-06 LAB — C-REACTIVE PROTEIN: CRP: 56 mg/L — ABNORMAL HIGH (ref <8.0)

## 2020-07-07 LAB — URINE CULTURE

## 2020-07-08 ENCOUNTER — Telehealth: Payer: Self-pay

## 2020-07-08 MED ORDER — PREDNISONE 20 MG PO TABS: 20.0000 mg | ORAL_TABLET | Freq: Every day | ORAL | 0 refills | Status: DC

## 2020-07-08 NOTE — Telephone Encounter (Signed)
Now addressed in associated result note from this week.

## 2020-07-08 NOTE — Progress Notes (Signed)
I spoke with Autumn Duran her labs show inflammation markers are elevated again back to the levels she had before starting treatment in January. Her serum creatinine is also mildly increased although not as high as before. I recommended she restart taking prednisone at 20 mg daily dose and have sent new Rx for this. Please reschedule her appointment to be approximately 3 weeks from now for follow up to reassess response to treatment.

## 2020-07-08 NOTE — Telephone Encounter (Signed)
Patient left a voicemail requesting a return call with the results of her labwork.

## 2020-07-12 ENCOUNTER — Other Ambulatory Visit: Payer: Self-pay

## 2020-07-12 ENCOUNTER — Ambulatory Visit (HOSPITAL_COMMUNITY)
Admission: RE | Admit: 2020-07-12 | Discharge: 2020-07-12 | Disposition: A | Discharge status: Home / Self Care | Payer: PPO | Source: Ambulatory Visit | Attending: Urology | Admitting: Urology

## 2020-07-12 DIAGNOSIS — N200 Calculus of kidney: Secondary | ICD-10-CM | POA: Diagnosis not present

## 2020-07-12 DIAGNOSIS — N132 Hydronephrosis with renal and ureteral calculous obstruction: Secondary | ICD-10-CM | POA: Diagnosis not present

## 2020-07-19 ENCOUNTER — Ambulatory Visit: Payer: PPO | Admitting: Internal Medicine

## 2020-07-20 DIAGNOSIS — F1120 Opioid dependence, uncomplicated: Secondary | ICD-10-CM | POA: Diagnosis not present

## 2020-07-20 DIAGNOSIS — M961 Postlaminectomy syndrome, not elsewhere classified: Secondary | ICD-10-CM | POA: Diagnosis not present

## 2020-07-20 DIAGNOSIS — M5136 Other intervertebral disc degeneration, lumbar region: Secondary | ICD-10-CM | POA: Diagnosis not present

## 2020-07-20 DIAGNOSIS — M4626 Osteomyelitis of vertebra, lumbar region: Secondary | ICD-10-CM | POA: Diagnosis not present

## 2020-07-22 ENCOUNTER — Other Ambulatory Visit (HOSPITAL_COMMUNITY): Payer: Self-pay | Admitting: *Deleted

## 2020-07-22 DIAGNOSIS — E559 Vitamin D deficiency, unspecified: Secondary | ICD-10-CM

## 2020-07-22 MED ORDER — ERGOCALCIFEROL 1.25 MG (50000 UT) PO CAPS: 50000.0000 [IU] | ORAL_CAPSULE | ORAL | 3 refills | Status: DC

## 2020-07-28 ENCOUNTER — Telehealth: Payer: Self-pay

## 2020-07-28 DIAGNOSIS — N200 Calculus of kidney: Secondary | ICD-10-CM

## 2020-07-28 NOTE — Telephone Encounter (Signed)
-----   Message from Cleon Gustin, MD sent at 07/27/2020 11:50 AM EDT ----- US shows stable hydronephrosis and stable bilateral calculi. Ill see her back in 3 months with a repeat renal US ----- Message ----- From: Dorisann Frames, RN Sent: 07/14/2020  11:03 AM EDT To: Cleon Gustin, MD  Please review

## 2020-07-28 NOTE — Telephone Encounter (Signed)
Left message to return call.  U/s order placed.

## 2020-07-29 NOTE — Telephone Encounter (Signed)
Pt returned phone call and I let her know that she needed to come back in 3 months to see Dr. Alyson Ingles and have an ultrasound done. Pt is aware of date and time of appointment.

## 2020-08-08 DIAGNOSIS — S20212A Contusion of left front wall of thorax, initial encounter: Secondary | ICD-10-CM | POA: Diagnosis not present

## 2020-08-08 DIAGNOSIS — S299XXA Unspecified injury of thorax, initial encounter: Secondary | ICD-10-CM | POA: Diagnosis not present

## 2020-08-08 DIAGNOSIS — S60212A Contusion of left wrist, initial encounter: Secondary | ICD-10-CM | POA: Diagnosis not present

## 2020-08-08 DIAGNOSIS — S60222A Contusion of left hand, initial encounter: Secondary | ICD-10-CM | POA: Diagnosis not present

## 2020-08-08 NOTE — Progress Notes (Signed)
Office Visit Note  Patient: Autumn Duran             Date of Birth: 03-22-1950           MRN: [id number]             PCP: [name], MD Referring: [name], MD Visit Date: 08/09/2020   Subjective:  Follow-up (Patient feels as if symptoms are well controlled on Prednisone 20 mg daily. Patient had a fall X 5 days and complains of left side pain and left wrist pain. Patient had rib xrays with negative results. )   History of Present Illness: Autumn Duran is a 70 y.o. female here for follow up for retroperitoneal fibrosis. She was tapering down her prednisone dose and developed recurrence of flank pain and noticed swelling in bilateral legs when decreased to 5 mg of prednisone. She followed up with her nephrologist and bilateral renal ultrasound was checked demonstrating some worsening of hydronephrosis again. She increased prednisone back up to 20 mg daily and feels this improved her symptoms again. She fell 5 days ago though landing on her left side and hand with chest wall pain in that area and left hand pain and bruising since that time.   Review of Systems  Constitutional:  Negative for fatigue.  HENT:  Positive for nose dryness. Negative for mouth sores and mouth dryness.   Eyes:  Negative for pain, itching, visual disturbance and dryness.  Respiratory:  Positive for cough and wheezing. Negative for hemoptysis, shortness of breath and difficulty breathing.   Cardiovascular:  Negative for chest pain, palpitations and swelling in legs/feet.  Gastrointestinal:  Positive for constipation. Negative for abdominal pain, blood in stool and diarrhea.  Endocrine: Negative for increased urination.  Genitourinary:  Negative for painful urination.  Musculoskeletal:  Positive for joint pain, joint pain and joint swelling. Negative for myalgias, muscle weakness, morning stiffness, muscle tenderness and myalgias.  Skin:  Negative for color change, rash and redness.  Allergic/Immunologic:  Negative for susceptible to infections.  Neurological:  Negative for dizziness, numbness, headaches, memory loss and weakness.  Hematological:  Negative for swollen glands.  Psychiatric/Behavioral:  Negative for confusion and sleep disturbance.    PMFS History:  Patient Active Problem List   Diagnosis Date Noted   Left-sided chest wall pain 08/09/2020   High risk medication use 08/09/2020   Varicose veins of leg with swelling, bilateral 07/05/2020   Primary hypercholesterolemia 05/11/2020   Compression fracture of L1 lumbar vertebra ([place]) 05/11/2020   Bilateral nephrolithiasis 05/11/2020   Bilateral hydronephrosis 04/14/2020   Retroperitoneal fibrosis 03/09/2020   Degenerative scoliosis 03/05/2020   Degenerative spondylolisthesis 03/05/2020   Opioid dependence ([place]) 09/18/2019   Elevated blood-pressure reading, without diagnosis of hypertension 01/08/2019   Closed fracture of proximal end of right humerus 06/26/2018   Paraesophageal hernia 12/12/2017   Osteoporosis 12/10/2017   Dysphagia 06/26/2017   Gastroesophageal reflux disease 06/26/2017   Hx of colonic polyps 06/26/2017   Nausea with vomiting 10/12/2014   Acute low back pain 08/13/2014   Epidural abscess 08/13/2014   Hypertension    Chronic back pain    Essential hypertension    Other iron deficiency anemia 04/10/2014   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[place]) 03/23/2014   Right hip pain 12/23/2013   Epidural abscess, L2-L5 11/04/2013   Lumbar compression fracture ([place]) 07/12/2013   Compression fracture 07/12/2013   Benign neoplasm of colon 12/18/2011   Esophageal reflux 12/18/2011   Other dysphagia 12/18/2011  Past Medical History:  Diagnosis Date   Bronchitis, chronic (HCC)    Chronic back pain    Chronic kidney disease    kidney stone s/p stent placement ( removed)   Complication of anesthesia    Compression fracture of L3 lumbar vertebra    Diverticulosis    GERD (gastroesophageal reflux disease)    Hemorrhoids     Hiatal hernia    History of kidney stones    Hx of adenomatous colonic polyps    Hyperlipidemia    Hypertension    Lumbar radicular pain    Osteoporosis 12/10/2017   PONV (postoperative nausea and vomiting)    Smoldering myeloma (HCC)    Sore throat    T7 vertebral fracture ([place])     Family History  Problem Relation Age of Onset   Breast cancer Sister 84   Breast cancer Other 69       aunt   Hypertension Mother    COPD Mother    COPD Father    Hypertension Sister    Aortic aneurysm Sister    Hypertension Son    Past Surgical History:  Procedure Laterality Date   BACK SURGERY     BONE MARROW ASPIRATION Left 02/2014   BONE MARROW BIOPSY Left 02/2014   CHOLECYSTECTOMY     COLONOSCOPY  12/18/2011   Procedure: COLONOSCOPY;  Surgeon: Lafayette Dragon, MD;  Location: [facility];  Service: Endoscopy;  Laterality: N/A;   COLONOSCOPY WITH PROPOFOL N/A 08/03/2017   Procedure: COLONOSCOPY WITH PROPOFOL;  Surgeon: Rogene Houston, MD;  Location: [facility];  Service: Endoscopy;  Laterality: N/A;   CYSTOSCOPY/RETROGRADE/URETEROSCOPY/STONE EXTRACTION WITH BASKET Bilateral 04/18/2018   Procedure: CYSTOSCOPY/BILATERAL RETROGRADE/URETEROSCOPY/STONE EXTRACTION WITH BASKET WITH RIGHT STENT PLACEMENT;  Surgeon: Cleon Gustin, MD;  Location: [facility];  Service: Urology;  Laterality: Bilateral;  1 HR   ESOPHAGEAL DILATION N/A 08/03/2017   Procedure: ESOPHAGEAL DILATION;  Surgeon: Rogene Houston, MD;  [facility]: [facility];  Service: Endoscopy;  Laterality: N/A;   ESOPHAGOGASTRODUODENOSCOPY  12/18/2011   Procedure: ESOPHAGOGASTRODUODENOSCOPY (EGD);  Surgeon: Lafayette Dragon, MD;  Location: [facility];  Service: Endoscopy;  Laterality: N/A;   ESOPHAGOGASTRODUODENOSCOPY (EGD) WITH PROPOFOL N/A 08/03/2017   Procedure: ESOPHAGOGASTRODUODENOSCOPY (EGD) WITH PROPOFOL;  Surgeon: Rogene Houston, MD;  [facility];  Service: Endoscopy;  Laterality: N/A;  pt knows to arrive at [place] [facility] [id number]/41/6606   Procedure: HOLMIUM LASER APPLICATION;  Surgeon: Cleon Gustin, MD;  Location: [facility];  Service: Urology;  Laterality: Right;   LUMBAR LAMINECTOMY FOR EPIDURAL ABSCESS Left 09/22/2013   Procedure: LUMBAR LAMINECTOMY FOR EPIDURAL ABSCESS LEFT LUMBAR FIVE-SACRAL ONE;  Surgeon: Charlie Pitter, MD;  Location: [facility];  Service: Neurosurgery;  Laterality: Left;   RADIOLOGY WITH ANESTHESIA N/A 08/13/2014   Procedure: RADIOLOGY WITH ANESTHESIA ;  Surgeon: Medication Radiologist, MD;  Location: [facility];  Service: Radiology;  Laterality: N/A;   URETERAL STENT PLACEMENT     VERTEBROPLASTY N/A 07/14/2013   Procedure: VERTEBROPLASTY WITH LUMBAR THREE BIOPSY;  Surgeon: Charlie Pitter, MD;  Location: [facility];  Service: Neurosurgery;  Laterality: N/A;  VERTEBROPLASTY WITH LUMBAR THREE BIOPSY   Social History   Social History Narrative   Not on file   There is no immunization history for the selected administration types on file for this patient.   Objective: Vital Signs: BP (!) 171/77 (BP Location: Right Arm,  Patient Position: Sitting, Cuff Size: Normal)   Pulse (!) 55   Ht $R'5\' 1"'DI$  (1.549 m)   Wt 168 lb 3.2 oz (76.3 kg)   BMI 31.78 kg/m    Physical Exam Constitutional:      Appearance: She is obese.  Pulmonary:     Effort: Pulmonary effort is normal.     Breath sounds: Normal breath sounds.  Skin:    General: Skin is warm and dry.     Comments: Left hand bruising  Neurological:     General: No focal deficit present.     Mental Status: She is alert.  Psychiatric:        Mood and Affect: Mood normal.     Musculoskeletal Exam:  Elbows full ROM no tenderness or swelling Wrists full ROM no tenderness or swelling Fingers full ROM no tenderness or swelling  Investigation: No additional findings.  Imaging: No results found.  Recent Labs: Lab Results  Component Value Date   WBC 13.4 (H) 08/09/2020   HGB 14.5 08/09/2020    PLT 316 08/09/2020   NA 138 08/09/2020   K 5.1 08/09/2020   CL 101 08/09/2020   CO2 29 08/09/2020   GLUCOSE 97 08/09/2020   BUN 24 08/09/2020   CREATININE 0.99 08/09/2020   BILITOT 0.5 08/09/2020   ALKPHOS 79 06/12/2019   AST 10 08/09/2020   ALT 8 08/09/2020   PROT 6.7 08/09/2020   ALBUMIN 3.5 06/12/2019   CALCIUM 11.0 (H) 08/09/2020   GFRAA 67 08/09/2020    Speciality Comments: No specialty comments available.  Procedures:  No procedures performed Allergies: Chlorhexidine gluconate, Morphine and related, and Tetracycline   Assessment / Plan:     Visit Diagnoses: Retroperitoneal fibrosis - Plan: Sedimentation rate, C-reactive protein, azaTHIOprine (IMURAN) 50 MG tablet  Symptoms improved again with prednisone increase, checking inflammatory markers for activity monitoring, previously appeared well correlated. Start azathioprine at $RemoveBeforeD'50mg'IOgdBFoLyrrbuy$  daily dose will plan to titrate. If labs normalized already can start slow taper otherwise will maintain at current GC dose for now.  Bilateral hydronephrosis Bilateral nephrolithiasis  Slight enlargement from last ultrasound, consistent with increase in creatinine and inflammatory activity. This still appears secondary to underlying RPF problems no indication for procedural intervention at this time.  Left-sided chest wall pain - Plan: lidocaine (LIDODERM) 5 %  Left sided pain today seems like chest wall injury from fall, no splinting or signs of complication from this. Recommended trial of topical lidocaine patch for pain relief expect a self limited injury.  High risk medication use - Plan: COMPLETE METABOLIC PANEL WITH GFR, CBC with Differential/Platelet, Glucose 6 phosphate dehydrogenase  Recommending azathioprine start today checking for CBC, CMP monitoring and checking G6PD activity level.   Orders: Orders Placed This Encounter  Procedures   Sedimentation rate   C-reactive protein   COMPLETE METABOLIC PANEL WITH GFR   CBC with  Differential/Platelet   Glucose 6 phosphate dehydrogenase    Meds ordered this encounter  Medications   lidocaine (LIDODERM) 5 %    Sig: Place 1 patch onto the skin daily. Remove & Discard patch within 12 hours or as directed by MD    Dispense:  30 patch    Refill:  0   azaTHIOprine (IMURAN) 50 MG tablet    Sig: Take 1 tablet (50 mg total) by mouth daily.    Dispense:  30 tablet    Refill:  0      Follow-Up Instructions: Return in about 4 weeks (around 09/06/2020) for  RP f/u.   Collier Salina, MD  Note - This record has been created using [employer].  Chart creation errors have been sought, but may not always  have been located. Such creation errors do not reflect on  the standard of medical care.

## 2020-08-09 ENCOUNTER — Other Ambulatory Visit: Payer: Self-pay

## 2020-08-09 ENCOUNTER — Ambulatory Visit: Payer: PPO | Admitting: Internal Medicine

## 2020-08-09 ENCOUNTER — Encounter: Payer: Self-pay | Admitting: Internal Medicine

## 2020-08-09 VITALS — BP 171/77 | HR 55 | Ht 61.0 in | Wt 168.2 lb

## 2020-08-09 DIAGNOSIS — R0789 Other chest pain: Secondary | ICD-10-CM | POA: Diagnosis not present

## 2020-08-09 DIAGNOSIS — N1330 Unspecified hydronephrosis: Secondary | ICD-10-CM

## 2020-08-09 DIAGNOSIS — N135 Crossing vessel and stricture of ureter without hydronephrosis: Secondary | ICD-10-CM

## 2020-08-09 DIAGNOSIS — Z79899 Other long term (current) drug therapy: Secondary | ICD-10-CM | POA: Diagnosis not present

## 2020-08-09 DIAGNOSIS — N200 Calculus of kidney: Secondary | ICD-10-CM | POA: Diagnosis not present

## 2020-08-09 DIAGNOSIS — K682 Retroperitoneal fibrosis: Secondary | ICD-10-CM

## 2020-08-09 MED ORDER — AZATHIOPRINE 50 MG PO TABS: 50.0000 mg | ORAL_TABLET | Freq: Every day | ORAL | 0 refills | Status: DC

## 2020-08-09 MED ORDER — LIDOCAINE 5 % EX PTCH: 1.0000 | MEDICATED_PATCH | CUTANEOUS | 0 refills | Status: DC

## 2020-08-09 NOTE — Patient Instructions (Signed)
Azathioprine tablets What is this medication? AZATHIOPRINE (ay za THYE oh preen) suppresses the immune system. It is used to prevent organ rejection after a transplant. It is also used to treat rheumatoidarthritis. This medicine may be used for other purposes; ask your health care provider orpharmacist if you have questions. COMMON BRAND NAME(S): Azasan, Imuran What should I tell my care team before I take this medication? They need to know if you have any of these conditions: infection kidney disease liver disease an unusual or allergic reaction to azathioprine, other medicines, lactose, foods, dyes, or preservatives pregnant or trying to get pregnant breast feeding How should I use this medication? Take this medicine by mouth with a full glass of water. Follow the directions on the prescription label. Take your medicine at regular intervals. Do not take your medicine more often than directed. Continue to take your medicine even ifyou feel better. Do not stop taking except on your doctor's advice. Talk to your pediatrician regarding the use of this medicine in children.Special care may be needed. Overdosage: If you think you have taken too much of this medicine contact apoison control center or emergency room at once. NOTE: This medicine is only for you. Do not share this medicine with others. What if I miss a dose? If you miss a dose, take it as soon as you can. If it is almost time for yournext dose, take only that dose. Do not take double or extra doses. What may interact with this medication? Do not take this medicine with any of the following medications: febuxostat mercaptopurine This medicine may also interact with the following medications: allopurinol aminosalicylates like sulfasalazine, mesalamine, balsalazide, and olsalazine leflunomide medicines called ACE inhibitors like benazepril, captopril, enalapril, fosinopril, quinapril, lisinopril, ramipril, and  trandolapril mycophenolate sulfamethoxazole; trimethoprim vaccines warfarin This list may not describe all possible interactions. Give your health care provider a list of all the medicines, herbs, non-prescription drugs, or dietary supplements you use. Also tell them if you smoke, drink alcohol, or use illegaldrugs. Some items may interact with your medicine. What should I watch for while using this medication? Visit your doctor or health care professional for regular checks on your progress. You will need frequent blood checks during the first few months youare receiving the medicine. If you get a cold or other infection while receiving this medicine, call your doctor or health care professional. Do not treat yourself. The medicine mayincrease your risk of getting an infection. Women should inform their doctor if they wish to become pregnant or think they might be pregnant. There is a potential for serious side effects to an unbornchild. Talk to your health care professional or pharmacist for more information. Men may have a reduced sperm count while they are taking this medicine. Talk toyour health care professional for more information. This medicine may increase your risk of getting certain kinds of cancer. Talk to your doctor about healthy lifestyle choices, important screenings, and yourrisk. What side effects may I notice from receiving this medication? Side effects that you should report to your doctor or health care professionalas soon as possible: allergic reactions like skin rash, itching or hives, swelling of the face, lips, or tongue changes in vision confusion fever, chills, or any other sign of infection loss of balance or coordination severe stomach pain unusual bleeding, bruising unusually weak or tired vomiting yellowing of the eyes or skin Side effects that usually do not require medical attention (report to yourdoctor or health care professional if they continue   or are  bothersome): hair loss nausea This list may not describe all possible side effects. Call your doctor for medical advice about side effects. You may report side effects to FDA at1-800-FDA-1088. Where should I keep my medication? Keep out of the reach of children. Store at room temperature between 15 and 25 degrees C (59 and 77 degrees F).Protect from light. Throw away any unused medicine after the expiration date. NOTE: This sheet is a summary. It may not cover all possible information. If you have questions about this medicine, talk to your doctor, pharmacist, orhealth care provider.  2022 Elsevier/Gold Standard (2013-06-10 12:00:31)   

## 2020-08-10 LAB — COMPLETE METABOLIC PANEL WITH GFR
AG Ratio: 1.5 (calc) (ref 1.0–2.5)
ALT: 8 U/L (ref 6–29)
AST: 10 U/L (ref 10–35)
Albumin: 4 g/dL (ref 3.6–5.1)
Alkaline phosphatase (APISO): 57 U/L (ref 37–153)
BUN: 24 mg/dL (ref 7–25)
CO2: 29 mmol/L (ref 20–32)
Calcium: 11 mg/dL — ABNORMAL HIGH (ref 8.6–10.4)
Chloride: 101 mmol/L (ref 98–110)
Creat: 0.99 mg/dL (ref 0.50–0.99)
GFR, Est African American: 67 mL/min/{1.73_m2} (ref >=60)
GFR, Est Non African American: 58 mL/min/{1.73_m2} — ABNORMAL LOW (ref >=60)
Globulin: 2.7 g/dL (calc) (ref 1.9–3.7)
Glucose, Bld: 97 mg/dL (ref 65–99)
Potassium: 5.1 mmol/L (ref 3.5–5.3)
Sodium: 138 mmol/L (ref 135–146)
Total Bilirubin: 0.5 mg/dL (ref 0.2–1.2)
Total Protein: 6.7 g/dL (ref 6.1–8.1)

## 2020-08-10 LAB — CBC WITH DIFFERENTIAL/PLATELET
Absolute Monocytes: 375 cells/uL (ref 200–950)
Basophils Absolute: 27 cells/uL (ref 0–200)
Basophils Relative: 0.2 %
Eosinophils Absolute: 13 cells/uL — ABNORMAL LOW (ref 15–500)
Eosinophils Relative: 0.1 %
HCT: 46.1 % — ABNORMAL HIGH (ref 35.0–45.0)
Hemoglobin: 14.5 g/dL (ref 11.7–15.5)
Lymphs Abs: 804 cells/uL — ABNORMAL LOW (ref 850–3900)
MCH: 27.8 pg (ref 27.0–33.0)
MCHC: 31.5 g/dL — ABNORMAL LOW (ref 32.0–36.0)
MCV: 88.3 fL (ref 80.0–100.0)
MPV: 10.3 fL (ref 7.5–12.5)
Monocytes Relative: 2.8 %
Neutro Abs: 12181 cells/uL — ABNORMAL HIGH (ref 1500–7800)
Neutrophils Relative %: 90.9 %
Platelets: 316 10*3/uL (ref 140–400)
RBC: 5.22 10*6/uL — ABNORMAL HIGH (ref 3.80–5.10)
RDW: 13.1 % (ref 11.0–15.0)
Total Lymphocyte: 6 %
WBC: 13.4 10*3/uL — ABNORMAL HIGH (ref 3.8–10.8)

## 2020-08-10 LAB — GLUCOSE 6 PHOSPHATE DEHYDROGENASE: G-6PDH: 18.9 U/g Hgb (ref 7.0–20.5)

## 2020-08-10 LAB — C-REACTIVE PROTEIN: CRP: 15 mg/L — ABNORMAL HIGH (ref <8.0)

## 2020-08-10 LAB — SEDIMENTATION RATE: Sed Rate: 6 mm/h (ref 0–30)

## 2020-08-12 DIAGNOSIS — I10 Essential (primary) hypertension: Secondary | ICD-10-CM | POA: Diagnosis not present

## 2020-08-12 DIAGNOSIS — Z299 Encounter for prophylactic measures, unspecified: Secondary | ICD-10-CM | POA: Diagnosis not present

## 2020-08-12 DIAGNOSIS — S298XXA Other specified injuries of thorax, initial encounter: Secondary | ICD-10-CM | POA: Diagnosis not present

## 2020-08-12 DIAGNOSIS — J309 Allergic rhinitis, unspecified: Secondary | ICD-10-CM | POA: Diagnosis not present

## 2020-08-12 DIAGNOSIS — N135 Crossing vessel and stricture of ureter without hydronephrosis: Secondary | ICD-10-CM | POA: Diagnosis not present

## 2020-08-13 ENCOUNTER — Telehealth: Payer: Self-pay

## 2020-08-13 NOTE — Telephone Encounter (Signed)
Patient called requesting a return call with her labwork results.

## 2020-08-13 NOTE — Telephone Encounter (Signed)
I returned patient's call and advised patient that once Dr. Benjamine Mola reviews the lab results, we will call her. Patient verbalized understanding.

## 2020-08-16 MED ORDER — PREDNISONE 10 MG PO TABS: 15.0000 mg | ORAL_TABLET | Freq: Every day | ORAL | 0 refills | Status: DC

## 2020-08-16 NOTE — Progress Notes (Signed)
Lab results look okay for the azathioprine to start 1 pill daily. Inflammation markers are improved almost back to normal. I recommend decreasing the prednisone to 15 mg daily but not any further until we follow up to make sure it stays improved. I will send an updated prescription for this.

## 2020-08-16 NOTE — Telephone Encounter (Signed)
I just sent recommendations attached with result note with plan to decrease prednisone to 15 mg daily and stay at that dose until f/u visit and sent new Rx for this. Lab tests showed inflammation markers mostly but not completely decreased since before.

## 2020-08-16 NOTE — Addendum Note (Signed)
Addended by: Collier Salina on: 08/16/2020 08:01 AM   Modules accepted: Orders

## 2020-08-24 DIAGNOSIS — J309 Allergic rhinitis, unspecified: Secondary | ICD-10-CM | POA: Diagnosis not present

## 2020-09-05 NOTE — Progress Notes (Signed)
Office Visit Note  Patient: Autumn Duran             Date of Birth: Jul 25, 1950           MRN: [id number]             PCP: [name], MD Referring: [name], MD Visit Date: 09/06/2020   Subjective:  Follow-up (Patient feels as if symptoms are well controlled on Imuran 50 mg daily and Prednisone 15 mg daily. )   History of Present Illness: Autumn Duran is a 70 y.o. female here for follow up for retroperitoneal fibrosis with complication of bilateral hydronephrosis on a prednisone taper currently at 15 mg PO daily and azathioprine 50 mg PO daily started 1 month ago.  She feels her symptoms are well controlled currently with no recurrence of lower extremity swelling or flank pain.  She has developed a skin rash in the lower abdomen midline that is not itchy or painful.  08/09/20 Autumn Duran is a 70 y.o. female here for follow up for retroperitoneal fibrosis. She was tapering down her prednisone dose and developed recurrence of flank pain and noticed swelling in bilateral legs when decreased to 5 mg of prednisone. She followed up with her nephrologist and bilateral renal ultrasound was checked demonstrating some worsening of hydronephrosis again. She increased prednisone back up to 20 mg daily and feels this improved her symptoms again. She fell 5 days ago though landing on her left side and hand with chest wall pain in that area and left hand pain and bruising since that time.  07/05/20 Autumn Duran is a 70 y.o. female here for follow up for retroperitoneal fibrosis with report of increased back and feet pain and swelling after discontinuing prednisone taper.  She also describes noticing a lot of mouth dryness.  She has not experienced a recurrence of the flank pain on her right side as she had before.  Her chronic low back pain around the compression fracture area is a bit worse off the prednisone.  She discussed her progress with her nephrologist noting her very large kidney stones  and previous hydronephrosis and scheduled for repeat renal ultrasound next week to reassess this.  05/11/20 Autumn Duran is a 70 y.o. female here for follow up for retroperitoneal fibrosis, currently on prednisone 20 mg PO daily. She decreased as planned by 10 mg increments from a month ago. She developed worsening right flank pain and saw her urology office for this found to have hematuria and prescribed antibiotics, although culture was negative and ultrasound demonstrated multiple renal stones. Currently she is feeling better with improvement of her pain although still has mild left sided tenderness.  04/13/20 Autumn Duran is a 70 y.o. female here for follow up for retroperitoneal fibrosis. Since her last visit she is taking prednisone 36m PO daily she notices difficulty sleeping on this medicine otherwise tolerating. She has had a large improvement in the right flank pain. MRI from orthopedics office was reviewed this did not indicate any recurrence of mass around her previous plasmacytoma site. There was moderate right and severe left hydronephrosis extending to the L3 level otherwise normal ureter beyond that.   03/31/20 SSHAVONDA WIEDMANis a 70y.o. female here for follow up of retroperitoneal fibrosis. At last visit IgG class 4 level was normal but inflammatory markers are very high. Serum creatinine also mildly increased, with previous renal involvement of hydronephrosis in the hospital last year that  had returned to normal. She was following up for lumbar spine MRI in the interval which was completed yesterday monitoring for any recurrence of plasmacytoma. She continues to have some diffuse low back muscle pain, but more severely is RLQ pain coming and going.   03/09/20 Autumn Duran is a 70 y.o. 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 She has a past history of plasmacytoma of the bone treated with kyphoplasty and stereotactic radiation treatment in 3295 later complications of  compression fracture at L3 with complications of diskitis and osteomyelitis s/p prolonged antibiotic therapy. Also history of osteoporosis, GERD, hypertension, and iron deficiency anemia. She had multiple abdominal CT scans in 2020 on account of right lower abdominal pain and numerous mostly nonobstructing renal calculi and hydronephrosis that by 01/2019 demonstrated retroperitoneal stranding and UPJ stenosis concerning for RPF. Workup also revealed increased IgG without a monocloncal gammopathy. She does have chronic dry eyes and mouth treated with lubricating eye drops. She does not have problems with excess dental decay and no history of eye abrasions. She does not notice lymphadenopathy, skin rashes, or swelling over the eyes or cheeks.   Imaging reviewed 04/2020 US Renal Mild right and moderate left hydronephrosis, decreased from prior ultrasound on February 16, 2020. Bilateral nephrolithiasis.   01/2020 US Renal 1. Moderate right and severe left hydronephrosis, progressed from the prior from 02/24/2019. 2. Bilateral nephrolithiasis, as detailed above.  Review of Systems  Constitutional:  Positive for fatigue.  HENT:  Positive for sore tongue. Negative for mouth sores, mouth dryness and nose dryness.   Eyes:  Positive for discharge and visual disturbance. Negative for pain, itching and dryness.  Respiratory:  Positive for cough and wheezing. Negative for hemoptysis, shortness of breath and difficulty breathing.   Cardiovascular:  Negative for chest pain, palpitations and swelling in legs/feet.  Gastrointestinal:  Positive for constipation. Negative for abdominal pain, blood in stool and diarrhea.  Endocrine: Negative for increased urination.  Genitourinary:  Negative for painful urination.  Musculoskeletal:  Positive for joint pain and joint pain. Negative for joint swelling, myalgias, muscle weakness, morning stiffness, muscle tenderness and myalgias.  Skin:  Negative for color change,  rash and redness.  Allergic/Immunologic: Negative for susceptible to infections.  Neurological:  Negative for dizziness, numbness, headaches, memory loss and weakness.  Hematological:  Negative for swollen glands.  Psychiatric/Behavioral:  Positive for sleep disturbance. Negative for confusion.    PMFS History:  Patient Active Problem List   Diagnosis Date Noted   Rash and other nonspecific skin eruption 09/06/2020   Left-sided chest wall pain 08/09/2020   High risk medication use 08/09/2020   Varicose veins of leg with swelling, bilateral 07/05/2020   Primary hypercholesterolemia 05/11/2020   Compression fracture of L1 lumbar vertebra ([place]) 05/11/2020   Bilateral nephrolithiasis 05/11/2020   Bilateral hydronephrosis 04/14/2020   Retroperitoneal fibrosis 03/09/2020   Degenerative scoliosis 03/05/2020   Degenerative spondylolisthesis 03/05/2020   Opioid dependence ([place]) 09/18/2019   Elevated blood-pressure reading, without diagnosis of hypertension 01/08/2019   Closed fracture of proximal end of right humerus 06/26/2018   Paraesophageal hernia 12/12/2017   Osteoporosis 12/10/2017   Dysphagia 06/26/2017   Gastroesophageal reflux disease 06/26/2017   Hx of colonic polyps 06/26/2017   Nausea with vomiting 10/12/2014   Acute low back pain 08/13/2014   Epidural abscess 08/13/2014   Hypertension    Chronic back pain    Essential hypertension    Other iron deficiency anemia 04/10/2014   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[place]) 03/23/2014  Right hip pain 12/23/2013   Epidural abscess, L2-L5 11/04/2013   Lumbar compression fracture ([facility]) 07/12/2013   Compression fracture 07/12/2013   Benign neoplasm of colon 12/18/2011   Esophageal reflux 12/18/2011   Other dysphagia 12/18/2011    Past Medical History:  Diagnosis Date   Bronchitis, chronic (HCC)    Chronic back pain    Chronic kidney disease    kidney stone s/p stent placement ( removed)   Complication of anesthesia    Compression  fracture of L3 lumbar vertebra    Diverticulosis    GERD (gastroesophageal reflux disease)    Hemorrhoids    Hiatal hernia    History of kidney stones    Hx of adenomatous colonic polyps    Hyperlipidemia    Hypertension    Lumbar radicular pain    Osteoporosis 12/10/2017   PONV (postoperative nausea and vomiting)    Smoldering myeloma (HCC)    Sore throat    T7 vertebral fracture ([place])     Family History  Problem Relation Age of Onset   Breast cancer Sister 53   Breast cancer Other 43       aunt   Hypertension Mother    COPD Mother    COPD Father    Hypertension Sister    Aortic aneurysm Sister    Hypertension Son    Past Surgical History:  Procedure Laterality Date   BACK SURGERY     BONE MARROW ASPIRATION Left 02/2014   BONE MARROW BIOPSY Left 02/2014   CHOLECYSTECTOMY     COLONOSCOPY  12/18/2011   Procedure: COLONOSCOPY;  Surgeon: Lafayette Dragon, MD;  Location: [facility];  Service: Endoscopy;  Laterality: N/A;   COLONOSCOPY WITH PROPOFOL N/A 08/03/2017   Procedure: COLONOSCOPY WITH PROPOFOL;  Surgeon: Rogene Houston, MD;  Location: [facility];  Service: Endoscopy;  Laterality: N/A;   CYSTOSCOPY/RETROGRADE/URETEROSCOPY/STONE EXTRACTION WITH BASKET Bilateral 04/18/2018   Procedure: CYSTOSCOPY/BILATERAL RETROGRADE/URETEROSCOPY/STONE EXTRACTION WITH BASKET WITH RIGHT STENT PLACEMENT;  Surgeon: Cleon Gustin, MD;  Location: [facility];  Service: Urology;  Laterality: Bilateral;  1 HR   ESOPHAGEAL DILATION N/A 08/03/2017   Procedure: ESOPHAGEAL DILATION;  Surgeon: Rogene Houston, MD;  Location: [facility];  Service: Endoscopy;  Laterality: N/A;   ESOPHAGOGASTRODUODENOSCOPY  12/18/2011   Procedure: ESOPHAGOGASTRODUODENOSCOPY (EGD);  Surgeon: Lafayette Dragon, MD;  Location: [facility];  Service: Endoscopy;  Laterality: N/A;   ESOPHAGOGASTRODUODENOSCOPY (EGD) WITH PROPOFOL N/A 08/03/2017   Procedure: ESOPHAGOGASTRODUODENOSCOPY (EGD) WITH PROPOFOL;  Surgeon: Rogene Houston, MD;  Location: [facility];  Service: Endoscopy;  Laterality: N/A;  pt knows to arrive at [place] Right 06/14/4079   Procedure: HOLMIUM LASER APPLICATION;  Surgeon: Cleon Gustin, MD;  Location: [facility];  Service: Urology;  Laterality: Right;   LUMBAR LAMINECTOMY FOR EPIDURAL ABSCESS Left 09/22/2013   Procedure: LUMBAR LAMINECTOMY FOR EPIDURAL ABSCESS LEFT LUMBAR FIVE-SACRAL ONE;  Surgeon: Charlie Pitter, MD;  Location: [facility];  Service: Neurosurgery;  Laterality: Left;   RADIOLOGY WITH ANESTHESIA N/A 08/13/2014   Procedure: RADIOLOGY WITH ANESTHESIA ;  Surgeon: Medication Radiologist, MD;  Location: [facility];  Service: Radiology;  Laterality: N/A;   URETERAL STENT PLACEMENT     VERTEBROPLASTY N/A 07/14/2013   Procedure: VERTEBROPLASTY WITH LUMBAR THREE BIOPSY;  Surgeon: Charlie Pitter, MD;  Location: [facility];  Service: Neurosurgery;  Laterality: N/A;  VERTEBROPLASTY WITH LUMBAR THREE BIOPSY  Social History   Social History Narrative   Not on file   There is no immunization history for the selected administration types on file for this patient.   Objective: Vital Signs: BP (!) 151/71 (BP Location: Left Arm, Patient Position: Sitting, Cuff Size: Normal)   Pulse 65   Ht 5' (1.524 m)   Wt 163 lb 12.8 oz (74.3 kg)   BMI 31.99 kg/m    Physical Exam Constitutional:      Appearance: She is obese.  Abdominal:     Comments: No abdominal tenderness to palpation or flank tenderness  Skin:    General: Skin is warm and dry.     Comments: Erythematous groin rash under her abdominal skin fold near the midline, affected skin appears moist, no satellite lesions, no skin breaks  Neurological:     Mental Status: She is alert.  Psychiatric:        Mood and Affect: Mood normal.     Musculoskeletal Exam:  Wrists full ROM no tenderness or swelling Fingers full ROM no tenderness or swelling   Investigation: No additional  findings.  Imaging: No results found.  Recent Labs: Lab Results  Component Value Date   WBC 12.4 (H) 09/06/2020   HGB 14.5 09/06/2020   PLT 317 09/06/2020   NA 140 09/06/2020   K 4.3 09/06/2020   CL 102 09/06/2020   CO2 33 (H) 09/06/2020   GLUCOSE 86 09/06/2020   BUN 22 09/06/2020   CREATININE 1.28 (H) 09/06/2020   BILITOT 0.3 09/06/2020   ALKPHOS 79 06/12/2019   AST 9 (L) 09/06/2020   ALT 9 09/06/2020   PROT 6.2 09/06/2020   ALBUMIN 3.5 06/12/2019   CALCIUM 10.7 (H) 09/06/2020   GFRAA 67 08/09/2020    Speciality Comments: No specialty comments available.  Procedures:  No procedures performed Allergies: Chlorhexidine gluconate, Morphine and related, and Tetracycline   Assessment / Plan:     Visit Diagnoses: Retroperitoneal fibrosis - Plan: Sedimentation rate, predniSONE (DELTASONE) 10 MG tablet, azaTHIOprine (IMURAN) 50 MG tablet  Rechecking sedimentation rate to monitor for disease activity.  If this remains well controlled would recommend continuing her prednisone taper versus if increased inflammation or negative change in kidney function would maintain current dose.  Plan to titrate azathioprine to 100 mg daily if normal CBC and liver function tests.  High risk medication use - Plan: CBC with Differential/Platelet, COMPLETE METABOLIC PANEL WITH GFR  Rechecking CBC and CMP since azathioprine treatment start.  Bilateral hydronephrosis - Plan: COMPLETE METABOLIC PANEL WITH GFR  Rechecking metabolic panel today to monitor kidney function.  Rash and other nonspecific skin eruption - Plan: hydrocortisone 1 % ointment  Skin rash no obvious evidence of superimposed infection recommend initial treatment with topical hydrocortisone cream and keeping the area drier as possible.  Would have a suspicion for yeast rash considering the steroid usage recommend if symptoms are not improving she should contact us would add antifungal treatment.   Orders: Orders Placed This  Encounter  Procedures   CBC with Differential/Platelet   COMPLETE METABOLIC PANEL WITH GFR   Sedimentation rate    Meds ordered this encounter  Medications   hydrocortisone 1 % ointment    Sig: Apply 1 application topically 2 (two) times daily.    Dispense:  30 g    Refill:  0   predniSONE (DELTASONE) 10 MG tablet    Sig: Take 1.5 tablets (15 mg total) by mouth daily with breakfast.    Dispense:  45  tablet    Refill:  0   azaTHIOprine (IMURAN) 50 MG tablet    Sig: Take 2 tablets (100 mg total) by mouth daily.    Dispense:  60 tablet    Refill:  0      Follow-Up Instructions: Return in about 4 weeks (around 10/04/2020) for RPF GC+AZA f/u 4wks.   Collier Salina, MD  Note - This record has been created using [employer].  Chart creation errors have been sought, but may not always  have been located. Such creation errors do not reflect on  the standard of medical care.

## 2020-09-06 ENCOUNTER — Encounter: Payer: Self-pay | Admitting: Internal Medicine

## 2020-09-06 ENCOUNTER — Ambulatory Visit: Payer: PPO | Admitting: Internal Medicine

## 2020-09-06 ENCOUNTER — Other Ambulatory Visit: Payer: Self-pay

## 2020-09-06 VITALS — BP 151/71 | HR 65 | Ht 60.0 in | Wt 163.8 lb

## 2020-09-06 DIAGNOSIS — N135 Crossing vessel and stricture of ureter without hydronephrosis: Secondary | ICD-10-CM

## 2020-09-06 DIAGNOSIS — R21 Rash and other nonspecific skin eruption: Secondary | ICD-10-CM

## 2020-09-06 DIAGNOSIS — N1330 Unspecified hydronephrosis: Secondary | ICD-10-CM

## 2020-09-06 DIAGNOSIS — Z79899 Other long term (current) drug therapy: Secondary | ICD-10-CM | POA: Diagnosis not present

## 2020-09-06 DIAGNOSIS — K682 Retroperitoneal fibrosis: Secondary | ICD-10-CM

## 2020-09-06 MED ORDER — HYDROCORTISONE 1 % EX OINT: 1.0000 "application " | TOPICAL_OINTMENT | Freq: Two times a day (BID) | CUTANEOUS | 0 refills | Status: DC

## 2020-09-07 LAB — COMPLETE METABOLIC PANEL WITH GFR
AG Ratio: 1.5 (calc) (ref 1.0–2.5)
ALT: 9 U/L (ref 6–29)
AST: 9 U/L — ABNORMAL LOW (ref 10–35)
Albumin: 3.7 g/dL (ref 3.6–5.1)
Alkaline phosphatase (APISO): 67 U/L (ref 37–153)
BUN/Creatinine Ratio: 17 (calc) (ref 6–22)
BUN: 22 mg/dL (ref 7–25)
CO2: 33 mmol/L — ABNORMAL HIGH (ref 20–32)
Calcium: 10.7 mg/dL — ABNORMAL HIGH (ref 8.6–10.4)
Chloride: 102 mmol/L (ref 98–110)
Creat: 1.28 mg/dL — ABNORMAL HIGH (ref 0.50–1.05)
Globulin: 2.5 g/dL (calc) (ref 1.9–3.7)
Glucose, Bld: 86 mg/dL (ref 65–99)
Potassium: 4.3 mmol/L (ref 3.5–5.3)
Sodium: 140 mmol/L (ref 135–146)
Total Bilirubin: 0.3 mg/dL (ref 0.2–1.2)
Total Protein: 6.2 g/dL (ref 6.1–8.1)
eGFR: 45 mL/min/{1.73_m2} — ABNORMAL LOW (ref >=60)

## 2020-09-07 LAB — CBC WITH DIFFERENTIAL/PLATELET
Absolute Monocytes: 570 cells/uL (ref 200–950)
Basophils Absolute: 37 cells/uL (ref 0–200)
Basophils Relative: 0.3 %
Eosinophils Absolute: 74 cells/uL (ref 15–500)
Eosinophils Relative: 0.6 %
HCT: 45.6 % — ABNORMAL HIGH (ref 35.0–45.0)
Hemoglobin: 14.5 g/dL (ref 11.7–15.5)
Lymphs Abs: 2691 cells/uL (ref 850–3900)
MCH: 27.9 pg (ref 27.0–33.0)
MCHC: 31.8 g/dL — ABNORMAL LOW (ref 32.0–36.0)
MCV: 87.7 fL (ref 80.0–100.0)
MPV: 9.5 fL (ref 7.5–12.5)
Monocytes Relative: 4.6 %
Neutro Abs: 9027 cells/uL — ABNORMAL HIGH (ref 1500–7800)
Neutrophils Relative %: 72.8 %
Platelets: 317 10*3/uL (ref 140–400)
RBC: 5.2 10*6/uL — ABNORMAL HIGH (ref 3.80–5.10)
RDW: 13.1 % (ref 11.0–15.0)
Total Lymphocyte: 21.7 %
WBC: 12.4 10*3/uL — ABNORMAL HIGH (ref 3.8–10.8)

## 2020-09-07 LAB — SEDIMENTATION RATE: Sed Rate: 14 mm/h (ref 0–30)

## 2020-09-08 MED ORDER — PREDNISONE 10 MG PO TABS: 15.0000 mg | ORAL_TABLET | Freq: Every day | ORAL | 0 refills | Status: AC

## 2020-09-08 MED ORDER — AZATHIOPRINE 50 MG PO TABS: 100.0000 mg | ORAL_TABLET | Freq: Every day | ORAL | 0 refills | Status: AC

## 2020-09-08 NOTE — Progress Notes (Signed)
The blood cell counts look okay so no medication problems. Sedimentation rate remains normal but the kidney function numbers are somewhat worse. I recommend staying at the current prednisone dose and we can increase the azathioprine dose to 100 mg daily (2 tablets).

## 2020-09-16 DIAGNOSIS — H6983 Other specified disorders of Eustachian tube, bilateral: Secondary | ICD-10-CM | POA: Diagnosis not present

## 2020-09-16 DIAGNOSIS — Z299 Encounter for prophylactic measures, unspecified: Secondary | ICD-10-CM | POA: Diagnosis not present

## 2020-09-16 DIAGNOSIS — M549 Dorsalgia, unspecified: Secondary | ICD-10-CM | POA: Diagnosis not present

## 2020-09-22 DIAGNOSIS — R051 Acute cough: Secondary | ICD-10-CM | POA: Diagnosis not present

## 2020-09-22 DIAGNOSIS — U071 COVID-19: Secondary | ICD-10-CM | POA: Diagnosis not present

## 2020-09-22 DIAGNOSIS — S39012A Strain of muscle, fascia and tendon of lower back, initial encounter: Secondary | ICD-10-CM | POA: Diagnosis not present

## 2020-09-23 DIAGNOSIS — I517 Cardiomegaly: Secondary | ICD-10-CM | POA: Diagnosis not present

## 2020-09-23 DIAGNOSIS — I10 Essential (primary) hypertension: Secondary | ICD-10-CM | POA: Diagnosis not present

## 2020-09-23 DIAGNOSIS — J189 Pneumonia, unspecified organism: Secondary | ICD-10-CM | POA: Diagnosis not present

## 2020-09-23 DIAGNOSIS — Z299 Encounter for prophylactic measures, unspecified: Secondary | ICD-10-CM | POA: Diagnosis not present

## 2020-09-23 DIAGNOSIS — Z789 Other specified health status: Secondary | ICD-10-CM | POA: Diagnosis not present

## 2020-09-28 ENCOUNTER — Ambulatory Visit (HOSPITAL_COMMUNITY): Payer: PPO

## 2020-10-05 ENCOUNTER — Telehealth: Payer: PPO | Admitting: Urology

## 2020-10-10 NOTE — Progress Notes (Signed)
Office Visit Note  Patient: Autumn Duran             Date of Birth: 08-29-1950           MRN: [id number]             PCP: [name], MD Referring: [name], MD Visit Date: 10/11/2020   Subjective:  Follow-up (Patient is taking Imuran 100 mg daily and Prednisone 15 mg daily. Patient was diagnosed with COVID x 3 weeks and developed pneumonia, but patient was treated and is now feeling better. )   History of Present Illness: Autumn Duran is a 70 y.o. female here for follow up for retroperitoneal fibrosis on prednisone 15 mg daily and azathioprine 100 mg p.o. daily.  Since our last visit she became ill with COVID-pneumonia symptoms took the pack Slo-Bid treatment for this and respiratory symptoms recovered completely.  She feels overall well recovered from the problem.  The left-sided chest wall and flank pain and left arm pain from her previous fall are also improved further.  She is noticing some swelling that comes and goes in her lower extremities more than before.  Lower abdominal skin rash is improved    Review of Systems  Constitutional:  Positive for fatigue.  HENT:  Negative for mouth sores, mouth dryness and nose dryness.   Eyes:  Negative for pain, itching, visual disturbance and dryness.  Respiratory:  Positive for cough. Negative for hemoptysis, shortness of breath and difficulty breathing.   Cardiovascular:  Negative for chest pain, palpitations and swelling in legs/feet.  Gastrointestinal:  Positive for constipation. Negative for abdominal pain, blood in stool and diarrhea.  Endocrine: Negative for increased urination.  Genitourinary:  Negative for painful urination.  Musculoskeletal:  Positive for joint swelling. Negative for joint pain, joint pain, myalgias, muscle weakness, morning stiffness, muscle tenderness and myalgias.  Skin:  Negative for color change, rash and redness.  Allergic/Immunologic: Positive for susceptible to infections.  Neurological:   Negative for dizziness, numbness, headaches, memory loss and weakness.  Hematological:  Negative for swollen glands.  Psychiatric/Behavioral:  Positive for sleep disturbance. Negative for confusion.    PMFS History:  Patient Active Problem List   Diagnosis Date Noted   COVID-19 10/11/2020   Right-sided back pain 10/11/2020   Rash and other nonspecific skin eruption 09/06/2020   Left-sided chest wall pain 08/09/2020   High risk medication use 08/09/2020   Varicose veins of leg with swelling, bilateral 07/05/2020   Primary hypercholesterolemia 05/11/2020   Compression fracture of L1 lumbar vertebra ([place]) 05/11/2020   Bilateral nephrolithiasis 05/11/2020   Bilateral hydronephrosis 04/14/2020   Retroperitoneal fibrosis 03/09/2020   Degenerative scoliosis 03/05/2020   Degenerative spondylolisthesis 03/05/2020   Opioid dependence ([place]) 09/18/2019   Elevated blood-pressure reading, without diagnosis of hypertension 01/08/2019   Closed fracture of proximal end of right humerus 06/26/2018   Paraesophageal hernia 12/12/2017   Osteoporosis 12/10/2017   Dysphagia 06/26/2017   Gastroesophageal reflux disease 06/26/2017   Hx of colonic polyps 06/26/2017   Nausea with vomiting 10/12/2014   Acute low back pain 08/13/2014   Epidural abscess 08/13/2014   Hypertension    Chronic back pain    Essential hypertension    Other iron deficiency anemia 04/10/2014   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[place]) 03/23/2014   Right hip pain 12/23/2013   Epidural abscess, L2-L5 11/04/2013   Lumbar compression fracture ([place]) 07/12/2013   Compression fracture 07/12/2013   Benign neoplasm of colon 12/18/2011  Esophageal reflux 12/18/2011   Other dysphagia 12/18/2011    Past Medical History:  Diagnosis Date   Bronchitis, chronic (HCC)    Chronic back pain    Chronic kidney disease    kidney stone s/p stent placement ( removed)   Complication of anesthesia    Compression fracture of L3 lumbar vertebra     Diverticulosis    GERD (gastroesophageal reflux disease)    Hemorrhoids    Hiatal hernia    History of kidney stones    Hx of adenomatous colonic polyps    Hyperlipidemia    Hypertension    Lumbar radicular pain    Osteoporosis 12/10/2017   PONV (postoperative nausea and vomiting)    Smoldering myeloma (HCC)    Sore throat    T7 vertebral fracture ([place])     Family History  Problem Relation Age of Onset   Breast cancer Sister 24   Breast cancer Other 61       aunt   Hypertension Mother    COPD Mother    COPD Father    Hypertension Sister    Aortic aneurysm Sister    Hypertension Son    Past Surgical History:  Procedure Laterality Date   BACK SURGERY     BONE MARROW ASPIRATION Left 02/2014   BONE MARROW BIOPSY Left 02/2014   CHOLECYSTECTOMY     COLONOSCOPY  12/18/2011   Procedure: COLONOSCOPY;  Surgeon: Lafayette Dragon, MD;  Location: [facility];  Service: Endoscopy;  Laterality: N/A;   COLONOSCOPY WITH PROPOFOL N/A 08/03/2017   Procedure: COLONOSCOPY WITH PROPOFOL;  Surgeon: Rogene Houston, MD;  Location[facility];  Service: Endoscopy;  Laterality: N/A;   CYSTOSCOPY/RETROGRADE/URETEROSCOPY/STONE EXTRACTION WITH BASKET Bilateral 04/18/2018   Procedure: CYSTOSCOPY/BILATERAL RETROGRADE/URETEROSCOPY/STONE EXTRACTION WITH BASKET WITH RIGHT STENT PLACEMENT;  Surgeon: Cleon Gustin, MD;  Location: [facility];  Service: Urology;  Laterality: Bilateral;  1 HR   ESOPHAGEAL DILATION N/A 08/03/2017   Procedure: ESOPHAGEAL DILATION;  Surgeon: Rogene Houston, MD;  [facility]: [facility];  Service: Endoscopy;  Laterality: N/A;   ESOPHAGOGASTRODUODENOSCOPY  12/18/2011   Procedure: ESOPHAGOGASTRODUODENOSCOPY (EGD);  Surgeon: Lafayette Dragon, MD;  Location: [facility];  Service: Endoscopy;  Laterality: N/A;   ESOPHAGOGASTRODUODENOSCOPY (EGD) WITH PROPOFOL N/A 08/03/2017   Procedure: ESOPHAGOGASTRODUODENOSCOPY (EGD) WITH PROPOFOL;  Surgeon: Rogene Houston, MD;  [facility];   Service: Endoscopy;  Laterality: N/A;  pt knows to arrive at [place] Right 6/43/3295   Procedure: HOLMIUM LASER APPLICATION;  Surgeon: Cleon Gustin, MD;  Location: [facility];  Service: Urology;  Laterality: Right;   LUMBAR LAMINECTOMY FOR EPIDURAL ABSCESS Left 09/22/2013   Procedure: LUMBAR LAMINECTOMY FOR EPIDURAL ABSCESS LEFT LUMBAR FIVE-SACRAL ONE;  Surgeon: Charlie Pitter, MD;  Location: [place] [facility];  Service: Neurosurgery;  Laterality: Left;   RADIOLOGY WITH ANESTHESIA N/A 08/13/2014   Procedure: RADIOLOGY WITH ANESTHESIA ;  Surgeon: Medication Radiologist, MD;  Location: [facility];  Service: Radiology;  Laterality: N/A;   URETERAL STENT PLACEMENT     VERTEBROPLASTY N/A 07/14/2013   Procedure: VERTEBROPLASTY WITH LUMBAR THREE BIOPSY;  Surgeon: Charlie Pitter, MD;  Location: [place] [facility];  Service: Neurosurgery;  Laterality: N/A;  VERTEBROPLASTY WITH LUMBAR THREE BIOPSY   Social History   Social History Narrative   Not on file   There is no immunization history for the selected administration types on file for this patient.  Objective: Vital Signs: BP (!) 151/84 (BP Location: Left Arm, Patient Position: Sitting, Cuff Size: Normal)   Pulse 64   Ht 5' (1.524 m)   Wt 170 lb (77.1 kg)   BMI 33.20 kg/m    Physical Exam Constitutional:      Appearance: She is obese.  Eyes:     Conjunctiva/sclera: Conjunctivae normal.  Cardiovascular:     Rate and Rhythm: Normal rate and regular rhythm.  Pulmonary:     Effort: Pulmonary effort is normal.     Breath sounds: Normal breath sounds.  Skin:    General: Skin is warm and dry.     Findings: No rash.     Comments: No pitting pedal edema  Neurological:     Mental Status: She is alert.  Psychiatric:        Mood and Affect: Mood normal.     Musculoskeletal Exam:  Elbows full ROM no tenderness or swelling Wrists full ROM no tenderness or swelling Fingers full ROM no tenderness or  swelling Knees full ROM no tenderness or swelling   Investigation: No additional findings.  Imaging: No results found.  Recent Labs: Lab Results  Component Value Date   WBC 8.6 10/11/2020   HGB 14.8 10/11/2020   PLT 285 10/11/2020   NA 141 10/11/2020   K 4.9 10/11/2020   CL 105 10/11/2020   CO2 29 10/11/2020   GLUCOSE 96 10/11/2020   BUN 18 10/11/2020   CREATININE 0.93 10/11/2020   BILITOT 0.4 10/11/2020   ALKPHOS 79 06/12/2019   AST 10 10/11/2020   ALT 8 10/11/2020   PROT 6.4 10/11/2020   ALBUMIN 3.5 06/12/2019   CALCIUM 10.1 10/11/2020   GFRAA 67 08/09/2020    Speciality Comments: No specialty comments available.  Procedures:  No procedures performed Allergies: Chlorhexidine gluconate, Morphine and related, and Tetracycline   Assessment / Plan:     Visit Diagnoses: Retroperitoneal fibrosis - Plan: Sedimentation rate  No specific symptoms of disease activity immunosuppression was increased with the Imuran we will recheck sed rate also CMP for monitoring the kidney activity suspected problems are secondary to obstruction.  If labs look good we will continue the azathioprine 100 mg p.o. daily and progress with prednisone tapering.  High risk medication use - Plan: CBC with Differential/Platelet, COMPLETE METABOLIC PANEL WITH GFR  Increase in azathioprine dose we will check CBC and CMP for medication safety monitoring.   Bilateral hydronephrosis  Bilateral hydronephrosis suspect obstruction due to retroperitoneal fibrosis followed with serial ultrasounds urology.  COVID-19  Recent infection treated with oral antivirals and seems almost if not completely improved from her initial pneumonia symptoms.   Orders: Orders Placed This Encounter  Procedures   Sedimentation rate   CBC with Differential/Platelet   COMPLETE METABOLIC PANEL WITH GFR    No orders of the defined types were placed in this encounter.    Follow-Up Instructions: No follow-ups on  file.   Collier Salina, MD  Note - This record has been created using [employer].  Chart creation errors have been sought, but may not always  have been located. Such creation errors do not reflect on  the standard of medical care.

## 2020-10-11 ENCOUNTER — Other Ambulatory Visit: Payer: Self-pay

## 2020-10-11 ENCOUNTER — Encounter: Payer: Self-pay | Admitting: Internal Medicine

## 2020-10-11 ENCOUNTER — Ambulatory Visit: Payer: PPO | Admitting: Internal Medicine

## 2020-10-11 VITALS — BP 151/84 | HR 64 | Ht 60.0 in | Wt 170.0 lb

## 2020-10-11 DIAGNOSIS — M549 Dorsalgia, unspecified: Secondary | ICD-10-CM | POA: Insufficient documentation

## 2020-10-11 DIAGNOSIS — U071 COVID-19: Secondary | ICD-10-CM

## 2020-10-11 DIAGNOSIS — N1330 Unspecified hydronephrosis: Secondary | ICD-10-CM

## 2020-10-11 DIAGNOSIS — N135 Crossing vessel and stricture of ureter without hydronephrosis: Secondary | ICD-10-CM

## 2020-10-11 DIAGNOSIS — M546 Pain in thoracic spine: Secondary | ICD-10-CM

## 2020-10-11 DIAGNOSIS — Z79899 Other long term (current) drug therapy: Secondary | ICD-10-CM

## 2020-10-12 ENCOUNTER — Other Ambulatory Visit: Payer: Self-pay | Admitting: Radiology

## 2020-10-12 DIAGNOSIS — N135 Crossing vessel and stricture of ureter without hydronephrosis: Secondary | ICD-10-CM

## 2020-10-12 DIAGNOSIS — K682 Retroperitoneal fibrosis: Secondary | ICD-10-CM

## 2020-10-12 LAB — CBC WITH DIFFERENTIAL/PLATELET
Absolute Monocytes: 404 cells/uL (ref 200–950)
Basophils Absolute: 34 cells/uL (ref 0–200)
Basophils Relative: 0.4 %
Eosinophils Absolute: 9 cells/uL — ABNORMAL LOW (ref 15–500)
Eosinophils Relative: 0.1 %
HCT: 45.9 % — ABNORMAL HIGH (ref 35.0–45.0)
Hemoglobin: 14.8 g/dL (ref 11.7–15.5)
Lymphs Abs: 731 cells/uL — ABNORMAL LOW (ref 850–3900)
MCH: 28.4 pg (ref 27.0–33.0)
MCHC: 32.2 g/dL (ref 32.0–36.0)
MCV: 87.9 fL (ref 80.0–100.0)
MPV: 10.3 fL (ref 7.5–12.5)
Monocytes Relative: 4.7 %
Neutro Abs: 7422 cells/uL (ref 1500–7800)
Neutrophils Relative %: 86.3 %
Platelets: 285 10*3/uL (ref 140–400)
RBC: 5.22 10*6/uL — ABNORMAL HIGH (ref 3.80–5.10)
RDW: 15.1 % — ABNORMAL HIGH (ref 11.0–15.0)
Total Lymphocyte: 8.5 %
WBC: 8.6 10*3/uL (ref 3.8–10.8)

## 2020-10-12 LAB — COMPLETE METABOLIC PANEL WITH GFR
AG Ratio: 1.6 (calc) (ref 1.0–2.5)
ALT: 8 U/L (ref 6–29)
AST: 10 U/L (ref 10–35)
Albumin: 3.9 g/dL (ref 3.6–5.1)
Alkaline phosphatase (APISO): 56 U/L (ref 37–153)
BUN: 18 mg/dL (ref 7–25)
CO2: 29 mmol/L (ref 20–32)
Calcium: 10.1 mg/dL (ref 8.6–10.4)
Chloride: 105 mmol/L (ref 98–110)
Creat: 0.93 mg/dL (ref 0.50–1.05)
Globulin: 2.5 g/dL (calc) (ref 1.9–3.7)
Glucose, Bld: 96 mg/dL (ref 65–99)
Potassium: 4.9 mmol/L (ref 3.5–5.3)
Sodium: 141 mmol/L (ref 135–146)
Total Bilirubin: 0.4 mg/dL (ref 0.2–1.2)
Total Protein: 6.4 g/dL (ref 6.1–8.1)
eGFR: 67 mL/min/{1.73_m2} (ref >=60)

## 2020-10-12 LAB — SEDIMENTATION RATE: Sed Rate: 6 mm/h (ref 0–30)

## 2020-10-12 MED ORDER — PREDNISONE 5 MG PO TABS: 10.0000 mg | ORAL_TABLET | Freq: Every day | ORAL | 0 refills | Status: DC

## 2020-10-12 MED ORDER — AZATHIOPRINE 100 MG PO TABS: 100.0000 mg | ORAL_TABLET | Freq: Every day | ORAL | 2 refills | Status: DC

## 2020-10-12 NOTE — Progress Notes (Signed)
Kidney function numbers are back to normal, improvement from 1 month ago. Good news, I recommend she stay on the current azathioprine 100 mg daily and reduce the prednisone to 10 mg per day.

## 2020-10-12 NOTE — Telephone Encounter (Signed)
Last Visit: 10/11/2020  DX: Retroperitoneal fibrosis  Current Dose per office note 10/11/2020: Imuran 100 mg daily; Prednisone 10 mg daily  Labs: CMP, CBC- 10/11/2020  Okay to refill Imuran and Prednisone?

## 2020-10-15 DIAGNOSIS — M549 Dorsalgia, unspecified: Secondary | ICD-10-CM | POA: Diagnosis not present

## 2020-10-15 DIAGNOSIS — S32010A Wedge compression fracture of first lumbar vertebra, initial encounter for closed fracture: Secondary | ICD-10-CM | POA: Diagnosis not present

## 2020-10-15 DIAGNOSIS — Z299 Encounter for prophylactic measures, unspecified: Secondary | ICD-10-CM | POA: Diagnosis not present

## 2020-10-18 DIAGNOSIS — I517 Cardiomegaly: Secondary | ICD-10-CM | POA: Diagnosis not present

## 2020-10-19 ENCOUNTER — Other Ambulatory Visit: Payer: Self-pay

## 2020-10-19 ENCOUNTER — Ambulatory Visit (HOSPITAL_COMMUNITY)
Admission: RE | Admit: 2020-10-19 | Discharge: 2020-10-19 | Disposition: A | Discharge status: Home / Self Care | Payer: PPO | Source: Ambulatory Visit | Attending: Urology | Admitting: Urology

## 2020-10-19 DIAGNOSIS — N132 Hydronephrosis with renal and ureteral calculous obstruction: Secondary | ICD-10-CM | POA: Diagnosis not present

## 2020-10-19 DIAGNOSIS — N200 Calculus of kidney: Secondary | ICD-10-CM

## 2020-10-25 DIAGNOSIS — F1120 Opioid dependence, uncomplicated: Secondary | ICD-10-CM | POA: Diagnosis not present

## 2020-10-25 DIAGNOSIS — M961 Postlaminectomy syndrome, not elsewhere classified: Secondary | ICD-10-CM | POA: Diagnosis not present

## 2020-10-25 DIAGNOSIS — M5136 Other intervertebral disc degeneration, lumbar region: Secondary | ICD-10-CM | POA: Diagnosis not present

## 2020-10-25 DIAGNOSIS — M5416 Radiculopathy, lumbar region: Secondary | ICD-10-CM | POA: Diagnosis not present

## 2020-11-08 ENCOUNTER — Telehealth: Payer: Self-pay

## 2020-11-08 DIAGNOSIS — N135 Crossing vessel and stricture of ureter without hydronephrosis: Secondary | ICD-10-CM

## 2020-11-08 DIAGNOSIS — Z79899 Other long term (current) drug therapy: Secondary | ICD-10-CM

## 2020-11-08 NOTE — Telephone Encounter (Signed)
Patient called to check if Dr. Benjamine Mola wants her to schedule another appointment.

## 2020-11-10 ENCOUNTER — Telehealth (INDEPENDENT_AMBULATORY_CARE_PROVIDER_SITE_OTHER): Payer: PPO | Admitting: Urology

## 2020-11-10 ENCOUNTER — Other Ambulatory Visit: Payer: Self-pay

## 2020-11-10 ENCOUNTER — Encounter: Payer: Self-pay | Admitting: Urology

## 2020-11-10 DIAGNOSIS — N135 Crossing vessel and stricture of ureter without hydronephrosis: Secondary | ICD-10-CM | POA: Diagnosis not present

## 2020-11-10 DIAGNOSIS — N200 Calculus of kidney: Secondary | ICD-10-CM | POA: Diagnosis not present

## 2020-11-10 NOTE — Patient Instructions (Signed)
Hydronephrosis ?Hydronephrosis is the swelling of one or both kidneys due to a blockage that stops urine from flowing out of the body. Kidneys filter waste from the blood and produce urine. This condition can lead to kidney failure and may become life-threatening if not treated promptly. ?What are the causes? ?In infants and children, common causes include problems that occur when a baby is developing in the womb. These can include problems in the kidneys or in the tubes that drain urine into the bladder (ureters). ?In adults, common causes include: ?Kidney stones. ?Pregnancy. ?A tumor or cyst in the abdomen or pelvis. ?An enlarged prostate gland. ?Other causes include: ?Bladder infection. ?Scar tissue from a previous surgery or injury. ?A blood clot. ?Cancer of the prostate, bladder, uterus, ovary, or colon. ?What are the signs or symptoms? ?Symptoms of this condition include: ?Pain or discomfort in your side (flank) or abdomen. ?Swelling in your abdomen. ?Nausea and vomiting. ?Fever. ?Pain when passing urine. ?Feelings of urgency when you need to urinate. ?Urinating more often than normal. ?In some cases, you may not have any symptoms. ?How is this diagnosed? ?This condition may be diagnosed based on: ?Your symptoms and medical history. ?A physical exam. ?Blood and urine tests. ?Imaging tests, such as an ultrasound, CT scan, or MRI. ?A procedure to look at your urinary tract and bladder by inserting a scope into the urethra (cystoscopy). ?How is this treated? ?Treatment for this condition depends on where the blockage is, how long it has been there, and what caused it. The goal of treatment is to remove the blockage. Treatment may include: ?Antibiotic medicines to treat or prevent infection. ?A procedure to place a small, thin tube (stent) into a blocked ureter. The stent will keep the ureter open so that urine can drain through it. ?A nonsurgical procedure that crushes kidney stones with shock waves  (extracorporeal shock wave lithotripsy). ?If kidney failure occurs, treatment may include dialysis or a kidney transplant. ?Follow these instructions at home: ? ?Take over-the-counter and prescription medicines only as told by your health care provider. ?If you were prescribed an antibiotic medicine, take it exactly as told by your health care provider. Do not stop taking the antibiotic even if you start to feel better. ?Rest and return to your normal activities as told by your health care provider. Ask your health care provider what activities are safe for you. ?Drink enough fluid to keep your urine pale yellow. ?Keep all follow-up visits. This is important. ?Contact a health care provider if: ?You continue to have symptoms after treatment. ?You develop new symptoms. ?Your urine becomes cloudy or bloody. ?You have a fever. ?Get help right away if: ?You have severe flank or abdominal pain. ?You cannot drink fluids without vomiting. ?Summary ?Hydronephrosis is the swelling of one or both kidneys due to a blockage that stops urine from flowing out of the body. ?Hydronephrosis can lead to kidney failure and may become life-threatening if not treated promptly. ?The goal of treatment is to remove the blockage. It may include a procedure to insert a stent into a blocked ureter, a procedure to break up kidney stones, or taking antibiotic medicines. ?Follow your health care provider's instructions for taking care of yourself at home, including instructions about drinking fluids, taking medicines, and limiting activities. ?This information is not intended to replace advice given to you by your health care provider. Make sure you discuss any questions you have with your health care provider. ?Document Revised: 06/03/2019 Document Reviewed: 06/03/2019 ?Elsevier Patient   Education ? 2022 [employer] ? ?

## 2020-11-10 NOTE — Telephone Encounter (Signed)
If she is feeling well does not need an office visit, we mostly need repeat of her lab test after decreasing the prednisone, any time works now it is 1 months since last visit. Needs ESR and CMP checked for her disease activity and kidney function.

## 2020-11-10 NOTE — Telephone Encounter (Signed)
Attempted to contact patient, LVM advising patient to call the office.

## 2020-11-10 NOTE — Progress Notes (Signed)
11/10/2020 1:06 PM   Autumn Duran 06-10-1950 [id number]  Referring provider: [name], MD [place],  [place] [address]  Patient location: home Physician location: office I connected with  Autumn Duran on 11/10/20 by a video enabled telemedicine application and verified that I am speaking with the correct person using two identifiers.   I discussed the limitations of evaluation and management by telemedicine. The patient expressed understanding and agreed to proceed.    Folllowup hydronephrosis and nephrolithiasis   HPI: [name] is a 70yo here for followup for hydronephrosis and nephrolithiasis. Renal US from 8/24 shows stable bilateral hydronephrosis and stable left renal calculi. Creatinine 0.93. No flank pain. No worsening LUTS. 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 No other complaints today   PMH: Past Medical History:  Diagnosis Date   Bronchitis, chronic (HCC)    Chronic back pain    Chronic kidney disease    kidney stone s/p stent placement ( removed)   Complication of anesthesia    Compression fracture of L3 lumbar vertebra    Diverticulosis    GERD (gastroesophageal reflux disease)    Hemorrhoids    Hiatal hernia    History of kidney stones    Hx of adenomatous colonic polyps    Hyperlipidemia    Hypertension    Lumbar radicular pain    Osteoporosis 12/10/2017   PONV (postoperative nausea and vomiting)    Smoldering myeloma (HCC)    Sore throat    T7 vertebral fracture ([facility]     Surgical History: Past Surgical History:  Procedure Laterality Date   BACK SURGERY     BONE MARROW ASPIRATION Left 02/2014   BONE MARROW BIOPSY Left 02/2014   CHOLECYSTECTOMY     COLONOSCOPY  12/18/2011   Procedure: COLONOSCOPY;  Surgeon: DLafayette Dragon MD;  [facility]: [facility];  Service: Endoscopy;  Laterality: N/A;   COLONOSCOPY WITH PROPOFOL N/A 08/03/2017   Procedure: COLONOSCOPY WITH  PROPOFOL;  Surgeon: RRogene Houston MD;  [facility];  Service: Endoscopy;  Laterality: N/A;   CYSTOSCOPY/RETROGRADE/URETEROSCOPY/STONE EXTRACTION WITH BASKET Bilateral 04/18/2018   Procedure: CYSTOSCOPY/BILATERAL RETROGRADE/URETEROSCOPY/STONE EXTRACTION WITH BASKET WITH RIGHT STENT PLACEMENT;  Surgeon: MCleon Gustin MD;  Location: [facility];  Service: Urology;  Laterality: Bilateral;  1 HR   ESOPHAGEAL DILATION N/A 08/03/2017   Procedure: ESOPHAGEAL DILATION;  Surgeon: RRogene Houston MD;  [facility];  Service: Endoscopy;  Laterality: N/A;   ESOPHAGOGASTRODUODENOSCOPY  12/18/2011   Procedure: ESOPHAGOGASTRODUODENOSCOPY (EGD);  Surgeon: DLafayette Dragon MD;  [facility];  Service: Endoscopy;  Laterality: N/A;   ESOPHAGOGASTRODUODENOSCOPY (EGD) WITH PROPOFOL N/A 08/03/2017   Procedure: ESOPHAGOGASTRODUODENOSCOPY (EGD) WITH PROPOFOL;  Surgeon: RRogene Houston MD;  [facility];  Service: Endoscopy;  Laterality: N/A;  [id number]2761  Procedure: HOLMIUM LASER APPLICATION;  Surgeon: MCleon Gustin MD;  Location: [facility];  Service: Urology;  Laterality: Right;   LUMBAR LAMINECTOMY FOR EPIDURAL ABSCESS Left 09/22/2013   Procedure: LUMBAR LAMINECTOMY FOR EPIDURAL ABSCESS LEFT LUMBAR FIVE-SACRAL ONE;  Surgeon: HCharlie Pitter MD;  [facility];  Service: Neurosurgery;  Laterality: Left;   RADIOLOGY WITH ANESTHESIA N/A 08/13/2014   Procedure: RADIOLOGY WITH ANESTHESIA ;  Surgeon: Medication Radiologist, MD;  [facility];  Service: Radiology;  Laterality: N/A;  URETERAL STENT PLACEMENT     VERTEBROPLASTY N/A 07/14/2013   Procedure: VERTEBROPLASTY WITH LUMBAR THREE BIOPSY;  Surgeon: Charlie Pitter, MD;  Location: [place] [facility];  Service: Neurosurgery;  Laterality: N/A;  VERTEBROPLASTY WITH LUMBAR THREE BIOPSY    Home Medications:  Allergies as of 11/10/2020        Reactions   Chlorhexidine Gluconate Itching   Pt c/o itching after abd wipes.    Morphine And Related Nausea And Vomiting   Headache   Tetracycline Nausea Only        Medication List        Accurate as of November 10, 2020  1:06 PM. If you have any questions, ask your nurse or doctor.          azathioprine 100 MG tablet Commonly known as: IMURAN Take 1 tablet (100 mg total) by mouth daily.   Azelastine HCl 0.15 % Soln Place 1 spray into both nostrils daily.   ergocalciferol 1.25 MG (50000 UT) capsule Commonly known as: VITAMIN D2 Take 1 capsule (50,000 Units total) by mouth once a week.   HYDROcodone-acetaminophen 5-325 MG tablet Commonly known as: NORCO/VICODIN as needed.   hydrocortisone 1 % ointment Apply 1 application topically 2 (two) times daily.   ibuprofen 200 MG tablet Commonly known as: ADVIL Take 2 tablets (400 mg total) by mouth daily as needed for mild pain.   lidocaine 5 % Commonly known as: Lidoderm Place 1 patch onto the skin daily. Remove & Discard patch within 12 hours or as directed by MD   omeprazole 20 MG capsule Commonly known as: PRILOSEC Take 20 mg by mouth 2 (two) times daily before a meal.   oxyCODONE 20 mg 12 hr tablet Commonly known as: OXYCONTIN Take 20 mg by mouth daily.   polyethylene glycol powder 17 GM/SCOOP powder Commonly known as: GLYCOLAX/MIRALAX Take by mouth.   predniSONE 5 MG tablet Commonly known as: DELTASONE Take 2 tablets (10 mg total) by mouth daily with breakfast.   THERATEARS OP Place 1 drop into both eyes daily as needed (dry eyes).        Allergies:  Allergies  Allergen Reactions   Chlorhexidine Gluconate Itching    Pt c/o itching after abd wipes.    Morphine And Related Nausea And Vomiting    Headache   Tetracycline Nausea Only    Family History: Family History  Problem Relation Age of Onset   Breast cancer Sister 12   Breast cancer Other 75       aunt   Hypertension Mother    COPD  Mother    COPD Father    Hypertension Sister    Aortic aneurysm Sister    Hypertension Son     Social History:  reports that she has never smoked. She has never used smokeless tobacco. She reports current alcohol use. She reports that she does not use drugs.  ROS: All other review of systems were reviewed and are negative except what is noted above in HPI   Laboratory Data: Lab Results  Component Value Date   WBC 8.6 10/11/2020   HGB 14.8 10/11/2020   HCT 45.9 (H) 10/11/2020   MCV 87.9 10/11/2020   PLT 285 10/11/2020    Lab Results  Component Value Date   CREATININE 0.93 10/11/2020    No results found for: PSA  No results found for: TESTOSTERONE  No results found for: HGBA1C  Urinalysis    Component Value Date/Time   COLORURINE YELLOW 08/13/2014 0850  APPEARANCEUR Clear 07/05/2020 1156   LABSPEC 1.017 08/13/2014 0850   PHURINE 6.5 08/13/2014 0850   GLUCOSEU Negative 07/05/2020 1156   HGBUR NEGATIVE 08/13/2014 0850   BILIRUBINUR Negative 07/05/2020 1156   KETONESUR >80 (A) 08/13/2014 0850   PROTEINUR Trace (A) 07/05/2020 1156   PROTEINUR NEGATIVE 08/13/2014 0850   UROBILINOGEN 0.2 08/13/2014 0850   NITRITE Negative 07/05/2020 1156   NITRITE NEGATIVE 08/13/2014 0850   LEUKOCYTESUR 1+ (A) 07/05/2020 1156    Lab Results  Component Value Date   LABMICR See below: 07/05/2020   WBCUA 6-10 (A) 07/05/2020   LABEPIT >10 (A) 07/05/2020   BACTERIA Many (A) 07/05/2020    Pertinent Imaging: Renal US 10/20/2020: Images reviewed and discussed with the patient No results found for this or any previous visit.  Results for orders placed during the hospital encounter of 12/18/13  US Venous Img Lower Bilateral  Narrative CLINICAL DATA:  Leg swelling  EXAM: BILATERAL LOWER EXTREMITY VENOUS DOPPLER ULTRASOUND  TECHNIQUE: Gray-scale sonography with graded compression, as well as color Doppler and duplex ultrasound were performed to evaluate the lower extremity  deep venous systems from the level of the common femoral vein and including the common femoral, femoral, profunda femoral, popliteal and calf veins including the posterior tibial, peroneal and gastrocnemius veins when visible. The superficial great saphenous vein was also interrogated. Spectral Doppler was utilized to evaluate flow at rest and with distal augmentation maneuvers in the common femoral, femoral and popliteal veins.  COMPARISON:  None.  FINDINGS: RIGHT LOWER EXTREMITY  Common Femoral Vein: No evidence of thrombus. Normal compressibility, respiratory phasicity and response to augmentation.  Saphenofemoral Junction: No evidence of thrombus. Normal compressibility and flow on color Doppler imaging.  Profunda Femoral Vein: No evidence of thrombus. Normal compressibility and flow on color Doppler imaging.  Femoral Vein: No evidence of thrombus. Normal compressibility, respiratory phasicity and response to augmentation.  Popliteal Vein: No evidence of thrombus. Normal compressibility, respiratory phasicity and response to augmentation.  Calf Veins: No evidence of thrombus. Normal compressibility and flow on color Doppler imaging.  Superficial Great Saphenous Vein: No evidence of thrombus. Normal compressibility and flow on color Doppler imaging.  Venous Reflux:  None.  Other Findings:  None.  LEFT LOWER EXTREMITY  Common Femoral Vein: No evidence of thrombus. Normal compressibility, respiratory phasicity and response to augmentation.  Saphenofemoral Junction: No evidence of thrombus. Normal compressibility and flow on color Doppler imaging.  Profunda Femoral Vein: No evidence of thrombus. Normal compressibility and flow on color Doppler imaging.  Femoral Vein: No evidence of thrombus. Normal compressibility, respiratory phasicity and response to augmentation.  Popliteal Vein: No evidence of thrombus. Normal compressibility, respiratory phasicity and  response to augmentation.  Calf Veins: Not well visualized.  Superficial Great Saphenous Vein: No evidence of thrombus. Normal compressibility and flow on color Doppler imaging.  Venous Reflux:  None.  Other Findings:  None.  IMPRESSION: No evidence of deep venous thrombosis.   Electronically Signed By: Kathreen Devoid On: 12/18/2013 13:37  No results found for this or any previous visit.  No results found for this or any previous visit.  Results for orders placed during the hospital encounter of 10/19/20  US RENAL  Narrative CLINICAL DATA:  Nephrolithiasis  Hydronephrosis  EXAM: RENAL / URINARY TRACT ULTRASOUND COMPLETE  COMPARISON:  07/12/2020  FINDINGS: Right Kidney:  Renal measurements: 11.9 x 5.5 x 6.0 cm = volume: 205 mL. Moderate hydronephrosis, similar to prior examination. 8 mm nonobstructing calculus seen in the  upper pole. Additional 8 mm nonobstructing calculus seen in the lower pole.  Left Kidney:  Renal measurements: 11.0 x 3.7 x 3.8 cm = volume: 81 mL. Unchanged moderate hydronephrosis.  Bladder:  Appears normal for degree of bladder distention.  Other:  None.  IMPRESSION: 1. Unchanged moderate bilateral hydronephrosis. 2. Two nonobstructing right renal calculi measuring up to 8 mm.   Electronically Signed By: Miachel Roux M.D. On: 10/20/2020 08:17  No results found for this or any previous visit.  No results found for this or any previous visit.  Results for orders placed during the hospital encounter of 02/26/19  CT RENAL STONE STUDY  Narrative CLINICAL DATA:  Right lower quadrant and right flank pain. History of nephrolithiasis. Prior cholecystectomy.  EXAM: CT ABDOMEN AND PELVIS WITHOUT CONTRAST  TECHNIQUE: Multidetector CT imaging of the abdomen and pelvis was performed following the standard protocol without IV contrast.  COMPARISON:  02/24/2019 renal sonogram. 04/17/2018 unenhanced  CT abdomen/pelvis.  FINDINGS: Lower chest: Mild right middle lobe and lingular and moderate left lower lobe cylindrical bronchiectasis with associated thick bandlike scarring, similar to slightly worsened in the interval. Right lower lobe 3 mm solid pulmonary nodule (series 4/image 3), stable, considered benign.  Hepatobiliary: Normal liver size. Simple 2.5 cm posterior left liver lobe cyst. No additional liver lesions. Punctate granulomatous right liver dome calcification is unchanged. Cholecystectomy. Stable dilated CBD, 8 mm diameter, within normal post cholecystectomy limits, without significant intrahepatic biliary ductal dilatation.  Pancreas: Normal, with no mass or duct dilation.  Spleen: Normal size. No mass.  Adrenals/Urinary Tract: Normal adrenals. Nonobstructing 2 mm upper, 3 mm interpolar and 3 mm lower right renal stones. Nonobstructing 1 mm upper left renal stones. Subcentimeter hypodense renal cortical lesion in the medial upper right kidney is too small to characterize and is unchanged, considered benign. No contour deforming renal masses. Mild right hydronephrosis with moderately dilated right extrarenal pelvis with somewhat abrupt caliber transition at the right ureteropelvic junction, increased since 08/10/2018 CT. Moderate left hydronephrosis with abrupt caliber transition at the left ureteropelvic junction, mildly increased since 08/10/2018 CT. Normal caliber ureters. No UPJ or ureteral stones. Normal bladder, with no bladder stones.  Stomach/Bowel: Curvilinear hyperdensity adjacent to the esophagogastric junction. Stomach is nondistended with no acute gastric abnormality. Normal caliber small bowel with no small bowel wall thickening. Normal appendix. Normal large bowel with no diverticulosis, large bowel wall thickening or pericolonic fat stranding.  Vascular/Lymphatic: Atherosclerotic nonaneurysmal abdominal aorta. There is worsening fat stranding  throughout the retroperitoneum surrounding the IVC and abdominal aorta and proximal ureters bilaterally. No pathologically enlarged lymph nodes in the abdomen or pelvis.  Reproductive: Grossly normal uterus. No adnexal mass. Bilateral tubal ligation clips in place.  Other: No pneumoperitoneum, ascites or focal fluid collection.  Musculoskeletal: No aggressive appearing focal osseous lesions. Severe degenerative disc disease at L3-4. Vertebroplasty material again noted within mild-to-moderate L3 vertebral compression fracture. Prominent bilateral lower lumbar facet arthropathy with stable grade 1 anterolisthesis at L5-S1.  IMPRESSION: 1. 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 2. Mild right and moderate left hydronephrosis with abrupt caliber transitions at the UPJ bilaterally, without evidence of obstructing stone or mass on this noncontrast scan. Findings could represent progressive proximal ureteral obstruction due to retroperitoneal fibrosis. 3. Tiny nonobstructing stones in both kidneys. 4. Mild to moderate bronchiectasis and thick bandlike scarring at both lung bases, similar to slightly worsened. 5.  Aortic Atherosclerosis (  ICD10-I70.0).   Electronically Signed By: Ilona Sorrel M.D. On: 02/26/2019 15:50   Assessment & Plan:    1. Nephrolithiasis -RTC 3 months with renal US  2. Retroperitoneal fibrosis -RTC 3 months with renal US. Management per rheumatology.    No follow-ups on file.  [name], MD  [facility] [place]

## 2020-11-11 NOTE — Telephone Encounter (Signed)
Attempted to contact patient, LVM advising patient to call the office.

## 2020-11-12 NOTE — Telephone Encounter (Signed)
Spoke with patient, advised per Dr. Benjamine Mola if she is feeling well does not need an office visit, we mostly need repeat of her lab test after decreasing the prednisone, any time works now it is 1 months since last visit. Needs ESR and CMP checked for her disease activity and kidney function.  Patient plans to come for labs early next week. Advised patient I would place future order for ESR and CMP.

## 2020-11-12 NOTE — Telephone Encounter (Signed)
Attempted to contact patient, LVM advising patient to call the office.

## 2020-11-16 ENCOUNTER — Telehealth: Payer: Self-pay | Admitting: Radiology

## 2020-11-16 DIAGNOSIS — N135 Crossing vessel and stricture of ureter without hydronephrosis: Secondary | ICD-10-CM | POA: Diagnosis not present

## 2020-11-16 DIAGNOSIS — Z79899 Other long term (current) drug therapy: Secondary | ICD-10-CM | POA: Diagnosis not present

## 2020-11-16 NOTE — Telephone Encounter (Signed)
Spoke with [facility]- verbal order given for labs (ESR and CMP w/ GFR).

## 2020-11-18 ENCOUNTER — Other Ambulatory Visit: Payer: Self-pay

## 2020-11-19 ENCOUNTER — Telehealth: Payer: Self-pay | Admitting: Radiology

## 2020-11-19 DIAGNOSIS — N135 Crossing vessel and stricture of ureter without hydronephrosis: Secondary | ICD-10-CM

## 2020-11-19 DIAGNOSIS — K682 Retroperitoneal fibrosis: Secondary | ICD-10-CM

## 2020-11-19 MED ORDER — PREDNISONE 5 MG PO TABS: 5.0000 mg | ORAL_TABLET | Freq: Every day | ORAL | 0 refills | Status: DC

## 2020-11-19 NOTE — Telephone Encounter (Signed)
Received VM from patient asking for return call regarding lab results we received via fax- patient would like to know the results / if she can come off or taper the Prednisone. Please advise. Thanks!

## 2020-11-19 NOTE — Addendum Note (Signed)
Addended by: Collier Salina on: 11/19/2020 03:10 PM   Modules accepted: Orders

## 2020-11-19 NOTE — Telephone Encounter (Signed)
Spoke with patient, advised lab results look good her sedimentation rate and kidney function are normal. Her latest kidney ultrasound showed moderate swelling still. Dr. Benjamine Mola recommends she can decrease the prednisone again down to 5 mg daily but not try to stop completely for now. Continue the azathioprine 100 mg daily. We should schedule a follow up in about 2 months.  Patient is in agreement and has been scheduled for follow-up visit on 01/17/2021.

## 2020-11-19 NOTE — Telephone Encounter (Signed)
Attempted to contact patient, LVM advising patient to call the office to discuss lab results / medication management moving forward.

## 2020-11-19 NOTE — Telephone Encounter (Signed)
Lab results look good her sedimentation rate and kidney function are normal. Her latest kidney ultrasound showed moderate swelling still. I recommend she can decrease the prednisone again down to 5 mg daily but not try to stop completely for now. Continue the azathioprine 100 mg daily. We should schedule a follow up in about 2 months.

## 2020-11-19 NOTE — Telephone Encounter (Signed)
Error

## 2020-12-03 ENCOUNTER — Telehealth: Payer: Self-pay | Admitting: Internal Medicine

## 2020-12-03 NOTE — Telephone Encounter (Signed)
Patient requesting refill on Azathioprine sent to [facility].

## 2020-12-03 NOTE — Telephone Encounter (Signed)
Prescription was sent to the pharmacy on 10/12/2020 for a 30 day supply with two refills. I called the pharmacy to confirm they had a refill remaining for prescription and they do. They pharmacy missed the refill  but have now corrected it and will be getting the rx ready for the patient. I called patient to advise.

## 2020-12-21 ENCOUNTER — Other Ambulatory Visit: Payer: Self-pay

## 2020-12-21 DIAGNOSIS — N135 Crossing vessel and stricture of ureter without hydronephrosis: Secondary | ICD-10-CM

## 2020-12-21 MED ORDER — PREDNISONE 5 MG PO TABS: 5.0000 mg | ORAL_TABLET | Freq: Every day | ORAL | 0 refills | Status: DC

## 2020-12-21 NOTE — Telephone Encounter (Signed)
Next Visit: 01/17/2021  Last Visit: 10/11/2020  Last Fill: 11/19/2020  Dx: Retroperitoneal fibrosis  Current Dose per last prescription on 11/19/2020: Take 1 tablet (5 mg total) by mouth daily with breakfast. Per office note on 10/11/2020: progress with prednisone tapering.  Okay to refill Prednisone?

## 2020-12-21 NOTE — Telephone Encounter (Signed)
Patient called requesting prescription refill of Prednisone 5 mg to be sent to [facility].

## 2021-01-06 DIAGNOSIS — F1120 Opioid dependence, uncomplicated: Secondary | ICD-10-CM | POA: Diagnosis not present

## 2021-01-06 DIAGNOSIS — M5416 Radiculopathy, lumbar region: Secondary | ICD-10-CM | POA: Diagnosis not present

## 2021-01-06 DIAGNOSIS — M961 Postlaminectomy syndrome, not elsewhere classified: Secondary | ICD-10-CM | POA: Diagnosis not present

## 2021-01-11 ENCOUNTER — Other Ambulatory Visit: Payer: Self-pay | Admitting: Internal Medicine

## 2021-01-11 DIAGNOSIS — N135 Crossing vessel and stricture of ureter without hydronephrosis: Secondary | ICD-10-CM

## 2021-01-11 NOTE — Telephone Encounter (Addendum)
Next Visit: 01/17/2021  Last Visit: 10/11/2020  Last Fill: 10/12/2020  DX: Retroperitoneal fibrosis   Current Dose per office note 10/11/2020: azathioprine 100 mg p.o. daily   Labs: 11/17/2020 Sodium 145, CBC 10/11/2020 RBC 5.22, Hct 45.9, RDW 15.1, Lymphs Abs 731, Eosinophils Absolute 9  Okay to refill azathioprine ?

## 2021-01-11 NOTE — Telephone Encounter (Signed)
Patient request refill on Azathioprine sent to [facility].

## 2021-01-12 MED ORDER — AZATHIOPRINE 100 MG PO TABS: 100.0000 mg | ORAL_TABLET | Freq: Every day | ORAL | 2 refills | Status: DC

## 2021-01-16 NOTE — Progress Notes (Deleted)
Office Visit Note  Patient: Autumn Duran             Date of Birth: March 21, 1950           MRN: [id number]             PCP: [name], MD Referring: [name], MD Visit Date: 01/17/2021   Subjective:  No chief complaint on file.   History of Present Illness: Autumn Duran is a 70 y.o. 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 ***   Previous HPI 10/11/20 Autumn Duran is a 70 y.o. female here for follow up for retroperitoneal fibrosis on prednisone 15 mg daily and azathioprine 100 mg p.o. daily.  Since our last visit she became ill with COVID-pneumonia symptoms took the pack Slo-Bid treatment for this and respiratory symptoms recovered completely.  She feels overall well recovered from the problem.  The left-sided chest wall and flank pain and left arm pain from her previous fall are also improved further.  She is noticing some swelling that comes and goes in her lower extremities more than before.  Lower abdominal skin rash is improved     No Rheumatology ROS completed.   PMFS History:  Patient Active Problem List   Diagnosis Date Noted   COVID-19 10/11/2020   Right-sided back pain 10/11/2020   Rash and other nonspecific skin eruption 09/06/2020   Left-sided chest wall pain 08/09/2020   High risk medication use 08/09/2020   Varicose veins of leg with swelling, bilateral 07/05/2020   Primary hypercholesterolemia 05/11/2020   Compression fracture of L1 lumbar vertebra ([place]) 05/11/2020   Bilateral nephrolithiasis 05/11/2020   Bilateral hydronephrosis 04/14/2020   Retroperitoneal fibrosis 03/09/2020   Degenerative scoliosis 03/05/2020   Degenerative spondylolisthesis 03/05/2020   Opioid dependence ([place]) 09/18/2019   Elevated blood-pressure reading, without diagnosis of hypertension 01/08/2019   Closed fracture of proximal end of right  humerus 06/26/2018   Paraesophageal hernia 12/12/2017   Osteoporosis 12/10/2017   Dysphagia 06/26/2017   Gastroesophageal reflux disease 06/26/2017   Hx of colonic polyps 06/26/2017   Nausea with vomiting 10/12/2014   Acute low back pain 08/13/2014   Epidural abscess 08/13/2014   Hypertension    Chronic back pain    Essential hypertension    Other iron deficiency anemia 04/10/2014   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[place]) 03/23/2014   Right hip pain 12/23/2013   Epidural abscess, L2-L5 11/04/2013   Lumbar compression fracture ([place]) 07/12/2013   Compression fracture 07/12/2013   Benign neoplasm of colon 12/18/2011   Esophageal reflux 12/18/2011   Other dysphagia 12/18/2011    Past Medical History:  Diagnosis Date   Bronchitis, chronic (HCC)    Chronic back pain    Chronic kidney disease    kidney stone s/p stent placement ( removed)   Complication of anesthesia    Compression fracture of L3 lumbar vertebra    Diverticulosis    GERD (gastroesophageal reflux disease)    Hemorrhoids    Hiatal hernia    History of kidney stones    Hx of adenomatous colonic polyps    Hyperlipidemia    Hypertension    Lumbar radicular pain    Osteoporosis 12/10/2017   PONV (postoperative nausea and vomiting)    Smoldering myeloma ([place])    Sore throat    T7 vertebral fracture ([place])     Family  History  Problem Relation Age of Onset   Breast cancer Sister 69   Breast cancer Other 69       aunt   Hypertension Mother    COPD Mother    COPD Father    Hypertension Sister    Aortic aneurysm Sister    Hypertension Son    Past Surgical History:  Procedure Laterality Date   BACK SURGERY     BONE MARROW ASPIRATION Left 02/2014   BONE MARROW BIOPSY Left 02/2014   CHOLECYSTECTOMY     COLONOSCOPY  12/18/2011   Procedure: COLONOSCOPY;  Surgeon: Lafayette Dragon, MD;  [facility];  Service: Endoscopy;  Laterality: N/A;   COLONOSCOPY WITH PROPOFOL N/A 08/03/2017   Procedure: COLONOSCOPY WITH PROPOFOL;   Surgeon: Rogene Houston, MD;  [facility];  Service: Endoscopy;  Laterality: N/A;   CYSTOSCOPY/RETROGRADE/URETEROSCOPY/STONE EXTRACTION WITH BASKET Bilateral 04/18/2018   Procedure: CYSTOSCOPY/BILATERAL RETROGRADE/URETEROSCOPY/STONE EXTRACTION WITH BASKET WITH RIGHT STENT PLACEMENT;  Surgeon: Cleon Gustin, MD;  [facility];  Service: Urology;  Laterality: Bilateral;  1 HR   ESOPHAGEAL DILATION N/A 08/03/2017   Procedure: ESOPHAGEAL DILATION;  Surgeon: Rogene Houston, MD;  [facility];  Service: Endoscopy;  Laterality: N/A;   ESOPHAGOGASTRODUODENOSCOPY  12/18/2011   Procedure: ESOPHAGOGASTRODUODENOSCOPY (EGD);  Surgeon: Lafayette Dragon, MD;  Location: [facility];  Service: Endoscopy;  Laterality: N/A;   ESOPHAGOGASTRODUODENOSCOPY (EGD) WITH PROPOFOL N/A 08/03/2017   Procedure: ESOPHAGOGASTRODUODENOSCOPY (EGD) WITH PROPOFOL;  Surgeon: Rogene Houston, MD;  [facility];  Service: Endoscopy;  Laterality: N/A;  pt knows to arrive at [place] Right 7/98/9211   Procedure: HOLMIUM LASER APPLICATION;  Surgeon: Cleon Gustin, MD;  [facility];  Service: Urology;  Laterality: Right;   LUMBAR LAMINECTOMY FOR EPIDURAL ABSCESS Left 09/22/2013   Procedure: LUMBAR LAMINECTOMY FOR EPIDURAL ABSCESS LEFT LUMBAR FIVE-SACRAL ONE;  Surgeon: Charlie Pitter, MD;  Location: [place] [facility];  Service: Neurosurgery;  Laterality: Left;   RADIOLOGY WITH ANESTHESIA N/A 08/13/2014   Procedure: RADIOLOGY WITH ANESTHESIA ;  Surgeon: Medication Radiologist, MD;  Location: [place] [facility];  Service: Radiology;  Laterality: N/A;   URETERAL STENT PLACEMENT     VERTEBROPLASTY N/A 07/14/2013   Procedure: VERTEBROPLASTY WITH LUMBAR THREE BIOPSY;  Surgeon: Charlie Pitter, MD;  Location: [place] [facility];  Service: Neurosurgery;  Laterality: N/A;  VERTEBROPLASTY WITH LUMBAR THREE BIOPSY   Social History   Social History Narrative   Not on file    There is no immunization history for the selected administration types on file for this patient.   Objective: Vital Signs: There were no vitals taken for this visit.   Physical Exam   Musculoskeletal Exam: ***  CDAI Exam: CDAI Score: -- Patient Global: --; Provider Global: -- Swollen: --; Tender: -- Joint Exam 01/17/2021   No joint exam has been documented for this visit   There is currently no information documented on the homunculus. Go to the Rheumatology activity and complete the homunculus joint exam.  Investigation: No additional findings.  Imaging: No results found.  Recent Labs: Lab Results  Component Value Date   WBC 8.6 10/11/2020   HGB 14.8 10/11/2020   PLT 285 10/11/2020   NA 141 10/11/2020   K 4.9 10/11/2020   CL 105 10/11/2020   CO2 29 10/11/2020   GLUCOSE 96 10/11/2020   BUN 18 10/11/2020   CREATININE 0.93 10/11/2020  BILITOT 0.4 10/11/2020   ALKPHOS 79 06/12/2019   AST 10 10/11/2020   ALT 8 10/11/2020   PROT 6.4 10/11/2020   ALBUMIN 3.5 06/12/2019   CALCIUM 10.1 10/11/2020   GFRAA 67 08/09/2020    Speciality Comments: No specialty comments available.  Procedures:  No procedures performed Allergies: Chlorhexidine gluconate, Morphine and related, and Tetracycline   Assessment / Plan:     Visit Diagnoses: No diagnosis found.  ***  Orders: No orders of the defined types were placed in this encounter.  No orders of the defined types were placed in this encounter.    Follow-Up Instructions: No follow-ups on file.   Collier Salina, MD  Note - This record has been created using [employer].  Chart creation errors have been sought, but may not always  have been located. Such creation errors do not reflect on  the standard of medical care.

## 2021-01-17 ENCOUNTER — Ambulatory Visit: Payer: PPO | Admitting: Internal Medicine

## 2021-01-17 DIAGNOSIS — Z6830 Body mass index (BMI) 30.0-30.9, adult: Secondary | ICD-10-CM | POA: Diagnosis not present

## 2021-01-17 DIAGNOSIS — J189 Pneumonia, unspecified organism: Secondary | ICD-10-CM | POA: Diagnosis not present

## 2021-01-17 DIAGNOSIS — Z299 Encounter for prophylactic measures, unspecified: Secondary | ICD-10-CM | POA: Diagnosis not present

## 2021-01-17 DIAGNOSIS — I10 Essential (primary) hypertension: Secondary | ICD-10-CM | POA: Diagnosis not present

## 2021-01-17 DIAGNOSIS — M2550 Pain in unspecified joint: Secondary | ICD-10-CM | POA: Diagnosis not present

## 2021-01-17 DIAGNOSIS — J069 Acute upper respiratory infection, unspecified: Secondary | ICD-10-CM | POA: Diagnosis not present

## 2021-01-31 ENCOUNTER — Encounter: Payer: Self-pay | Admitting: Internal Medicine

## 2021-01-31 ENCOUNTER — Other Ambulatory Visit: Payer: Self-pay

## 2021-01-31 ENCOUNTER — Ambulatory Visit (HOSPITAL_COMMUNITY): Payer: PPO

## 2021-01-31 ENCOUNTER — Ambulatory Visit: Payer: PPO | Admitting: Internal Medicine

## 2021-01-31 VITALS — BP 159/78 | HR 59 | Resp 16 | Ht 61.0 in | Wt 176.0 lb

## 2021-01-31 DIAGNOSIS — N135 Crossing vessel and stricture of ureter without hydronephrosis: Secondary | ICD-10-CM

## 2021-01-31 DIAGNOSIS — Z789 Other specified health status: Secondary | ICD-10-CM | POA: Diagnosis not present

## 2021-01-31 DIAGNOSIS — I10 Essential (primary) hypertension: Secondary | ICD-10-CM | POA: Diagnosis not present

## 2021-01-31 DIAGNOSIS — N1330 Unspecified hydronephrosis: Secondary | ICD-10-CM

## 2021-01-31 DIAGNOSIS — Z0000 Encounter for general adult medical examination without abnormal findings: Secondary | ICD-10-CM | POA: Diagnosis not present

## 2021-01-31 DIAGNOSIS — E7800 Pure hypercholesterolemia, unspecified: Secondary | ICD-10-CM | POA: Diagnosis not present

## 2021-01-31 DIAGNOSIS — Z1331 Encounter for screening for depression: Secondary | ICD-10-CM | POA: Diagnosis not present

## 2021-01-31 DIAGNOSIS — R5383 Other fatigue: Secondary | ICD-10-CM | POA: Diagnosis not present

## 2021-01-31 DIAGNOSIS — F1120 Opioid dependence, uncomplicated: Secondary | ICD-10-CM | POA: Diagnosis not present

## 2021-01-31 DIAGNOSIS — Z299 Encounter for prophylactic measures, unspecified: Secondary | ICD-10-CM | POA: Diagnosis not present

## 2021-01-31 DIAGNOSIS — Z79899 Other long term (current) drug therapy: Secondary | ICD-10-CM | POA: Diagnosis not present

## 2021-01-31 DIAGNOSIS — Z1339 Encounter for screening examination for other mental health and behavioral disorders: Secondary | ICD-10-CM | POA: Diagnosis not present

## 2021-01-31 DIAGNOSIS — Z7189 Other specified counseling: Secondary | ICD-10-CM | POA: Diagnosis not present

## 2021-01-31 DIAGNOSIS — Z6832 Body mass index (BMI) 32.0-32.9, adult: Secondary | ICD-10-CM | POA: Diagnosis not present

## 2021-01-31 NOTE — Progress Notes (Signed)
Office Visit Note  Patient: Autumn Duran             Date of Birth: Oct 15, 1950           MRN: [id number]             PCP: [name], MD Referring: [name], MD Visit Date: 01/31/2021  Subjective:  Follow-up (Doing good)   History of Present Illness: Autumn Duran is a 70 y.o. female here for follow up of retroperitoneal fibrosis with hydronephrosis on azathioprine 100 mg PO daily and prednisone 5 mg daily. She tapered prednisone down from 15 mg to 5 mg daily since last visit without noticing specific problems. She had URI symptoms with congestion and drainage treated with amoxicillin. Weight gain with central adiposity increase she reports 16 pounds total. She notices some sensitivity with monitor bumps playing with her grandson. She is scheduled for renal ultrasound on 12/8 for monitoring by Dr. Alyson Ingles.  Previous HPI 10/11/20 Autumn Duran is a 70 y.o. female here for follow up for retroperitoneal fibrosis on prednisone 15 mg daily and azathioprine 100 mg p.o. daily.  Since our last visit she became ill with COVID-pneumonia symptoms took the paxlovid treatment for this and respiratory symptoms recovered completely.  She feels overall well recovered from the problem.  The left-sided chest wall and flank pain and left arm pain from her previous fall are also improved further.  She is noticing some swelling that comes and goes in her lower extremities more than before.  Lower abdominal skin rash is improved   Review of Systems  Constitutional:  Negative for fatigue.  HENT:  Negative for mouth dryness.   Eyes:  Negative for dryness.  Respiratory:  Negative for shortness of breath.   Cardiovascular:  Negative for swelling in legs/feet.  Gastrointestinal:  Positive for constipation.  Endocrine: Positive for increased urination.  Genitourinary:  Negative for difficulty urinating.  Musculoskeletal:  Negative for morning stiffness.  Skin:  Negative for rash.   Allergic/Immunologic: Positive for susceptible to infections.  Neurological:  Negative for weakness.  Hematological:  Positive for bruising/bleeding tendency.  Psychiatric/Behavioral:  Positive for sleep disturbance.    PMFS History:  Patient Active Problem List   Diagnosis Date Noted   COVID-19 10/11/2020   Right-sided back pain 10/11/2020   Left-sided chest wall pain 08/09/2020   High risk medication use 08/09/2020   Varicose veins of leg with swelling, bilateral 07/05/2020   Primary hypercholesterolemia 05/11/2020   Compression fracture of L1 lumbar vertebra ([place]) 05/11/2020   Bilateral nephrolithiasis 05/11/2020   Bilateral hydronephrosis 04/14/2020   Retroperitoneal fibrosis 03/09/2020   Degenerative scoliosis 03/05/2020   Degenerative spondylolisthesis 03/05/2020   Opioid dependence ([place]) 09/18/2019   Elevated blood-pressure reading, without diagnosis of hypertension 01/08/2019   Closed fracture of proximal end of right humerus 06/26/2018   Paraesophageal hernia 12/12/2017   Osteoporosis 12/10/2017   Dysphagia 06/26/2017   Gastroesophageal reflux disease 06/26/2017   Hx of colonic polyps 06/26/2017   Nausea with vomiting 10/12/2014   Acute low back pain 08/13/2014   Epidural abscess 08/13/2014   Hypertension    Chronic back pain    Essential hypertension    Other iron deficiency anemia 04/10/2014   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   Right hip pain 12/23/2013   Epidural abscess, L2-L5 11/04/2013   Lumbar compression fracture ([place]) 07/12/2013   Compression fracture 07/12/2013   Benign neoplasm of colon 12/18/2011   Esophageal reflux 12/18/2011  Other dysphagia 12/18/2011    Past Medical History:  Diagnosis Date   Bronchitis, chronic (HCC)    Chronic back pain    Chronic kidney disease    kidney stone s/p stent placement ( removed)   Complication of anesthesia    Compression fracture of L3 lumbar vertebra    Diverticulosis    GERD (gastroesophageal  reflux disease)    Hemorrhoids    Hiatal hernia    History of kidney stones    Hx of adenomatous colonic polyps    Hyperlipidemia    Hypertension    Lumbar radicular pain    Osteoporosis 12/10/2017   PONV (postoperative nausea and vomiting)    Smoldering myeloma    Sore throat    T7 vertebral fracture ([place])     Family History  Problem Relation Age of Onset   Breast cancer Sister 29   Breast cancer Other 92       aunt   Hypertension Mother    COPD Mother    COPD Father    Hypertension Sister    Aortic aneurysm Sister    Hypertension Son    Past Surgical History:  Procedure Laterality Date   BACK SURGERY     BONE MARROW ASPIRATION Left 02/2014   BONE MARROW BIOPSY Left 02/2014   CHOLECYSTECTOMY     COLONOSCOPY  12/18/2011   Procedure: COLONOSCOPY;  Surgeon: Lafayette Dragon, MD;  [facility];  Service: Endoscopy;  Laterality: N/A;   COLONOSCOPY WITH PROPOFOL N/A 08/03/2017   Procedure: COLONOSCOPY WITH PROPOFOL;  Surgeon: Rogene Houston, MD;  [facility];  Service: Endoscopy;  Laterality: N/A;   CYSTOSCOPY/RETROGRADE/URETEROSCOPY/STONE EXTRACTION WITH BASKET Bilateral 04/18/2018   Procedure: CYSTOSCOPY/BILATERAL RETROGRADE/URETEROSCOPY/STONE EXTRACTION WITH BASKET WITH RIGHT STENT PLACEMENT;  Surgeon: Cleon Gustin, MD;  [facility];  Service: Urology;  Laterality: Bilateral;  1 HR   ESOPHAGEAL DILATION N/A 08/03/2017   Procedure: ESOPHAGEAL DILATION;  Surgeon: Rogene Houston, MD;  [facility];  Service: Endoscopy;  Laterality: N/A;   ESOPHAGOGASTRODUODENOSCOPY  12/18/2011   Procedure: ESOPHAGOGASTRODUODENOSCOPY (EGD);  Surgeon: Lafayette Dragon, MD;  Location: [facility];  Service: Endoscopy;  Laterality: N/A;   ESOPHAGOGASTRODUODENOSCOPY (EGD) WITH PROPOFOL N/A 08/03/2017   Procedure: ESOPHAGOGASTRODUODENOSCOPY (EGD) WITH PROPOFOL;  Surgeon: Rogene Houston, MD;  [facility];  Service: Endoscopy;  Laterality: N/A;  pt knows  to arrive at [place] Right 2/29/7989   Procedure: HOLMIUM LASER APPLICATION;  Surgeon: Cleon Gustin, MD;  [facility];  Service: Urology;  Laterality: Right;   LUMBAR LAMINECTOMY FOR EPIDURAL ABSCESS Left 09/22/2013   Procedure: LUMBAR LAMINECTOMY FOR EPIDURAL ABSCESS LEFT LUMBAR FIVE-SACRAL ONE;  Surgeon: Charlie Pitter, MD;  Location: [place] [facility];  Service: Neurosurgery;  Laterality: Left;   RADIOLOGY WITH ANESTHESIA N/A 08/13/2014   Procedure: RADIOLOGY WITH ANESTHESIA ;  Surgeon: Medication Radiologist, MD;  Location: [place] [facility];  Service: Radiology;  Laterality: N/A;   URETERAL STENT PLACEMENT     VERTEBROPLASTY N/A 07/14/2013   Procedure: VERTEBROPLASTY WITH LUMBAR THREE BIOPSY;  Surgeon: Charlie Pitter, MD;  Location: [facility];  Service: Neurosurgery;  Laterality: N/A;  VERTEBROPLASTY WITH LUMBAR THREE BIOPSY   Social History   Social History Narrative   Not on file   There is no immunization history for the selected administration types on file for this patient.   Objective: Vital Signs: BP Marland Kitchen)  159/78 (BP Location: Left Arm, Patient Position: Sitting, Cuff Size: Normal)   Pulse (!) 59   Resp 16   Ht $R'5\' 1"'dG$  (1.549 m)   Wt 176 lb (79.8 kg)   BMI 33.25 kg/m    Physical Exam Constitutional:      Appearance: She is obese.  HENT:     Mouth/Throat:     Mouth: Mucous membranes are moist.     Pharynx: Oropharynx is clear.  Eyes:     Conjunctiva/sclera: Conjunctivae normal.  Cardiovascular:     Rate and Rhythm: Normal rate and regular rhythm.  Pulmonary:     Effort: Pulmonary effort is normal.     Comments: Faint inspiratory rhonchi Musculoskeletal:     Right lower leg: No edema.     Left lower leg: No edema.  Lymphadenopathy:     Cervical: No cervical adenopathy.  Skin:    General: Skin is warm and dry.     Findings: No rash.  Neurological:     Mental Status: She is alert.  Psychiatric:        Mood and Affect:  Mood normal.     Musculoskeletal Exam:  Elbows full ROM no tenderness or swelling Wrists full ROM no tenderness or swelling Fingers full ROM no tenderness or swelling, heberdon's nodes bilaterally Knees full ROM no tenderness or swelling Ankles full ROM no tenderness or swelling   Investigation: No additional findings.  Imaging: No results found.  Recent Labs: Lab Results  Component Value Date   WBC 8.6 10/11/2020   HGB 14.8 10/11/2020   PLT 285 10/11/2020   NA 141 10/11/2020   K 4.9 10/11/2020   CL 105 10/11/2020   CO2 29 10/11/2020   GLUCOSE 96 10/11/2020   BUN 18 10/11/2020   CREATININE 0.93 10/11/2020   BILITOT 0.4 10/11/2020   ALKPHOS 79 06/12/2019   AST 10 10/11/2020   ALT 8 10/11/2020   PROT 6.4 10/11/2020   ALBUMIN 3.5 06/12/2019   CALCIUM 10.1 10/11/2020   GFRAA 67 08/09/2020    Speciality Comments: No specialty comments available.  Procedures:  No procedures performed Allergies: Chlorhexidine gluconate, Morphine and related, and Tetracycline   Assessment / Plan:     Visit Diagnoses: Retroperitoneal fibrosis  Clinically no complaints of abdominal or low back pain suggestive for active inflammation.  Continuing azathioprine 100 mg p.o. daily.  Awaiting updated labs drawn in PCP office today if renal function and inflammatory markers appear good can try to discontinue the remaining 5 mg prednisone.  High risk medication use  Continued long-term use of azathioprine recommended medication monitoring with CBC and CMP needed reports these were just drawn we will review wen received for continuing treatment. No interval side effects or new infections. She is having some bruising and continued weight gain with the prednisone.  Primary hypertension  Hypertension moderately uncontrolled probably worse due to the continued prednisone treatment.  Bilateral hydronephrosis  Upcoming renal ultrasound on December 8 scheduled for reassessment of bilateral  hydronephrosis.  She is ongoing follow-up with Dr. Alyson Ingles.  Orders: No orders of the defined types were placed in this encounter.  No orders of the defined types were placed in this encounter.    Follow-Up Instructions: Return in about 3 months (around 05/01/2021) for RF AZA/GC taper f/u 69mos.   Collier Salina, MD  Note - This record has been created using [employer].  Chart creation errors have been sought, but may not always  have been located. Such creation errors  do not reflect on  the standard of medical care.

## 2021-02-01 ENCOUNTER — Ambulatory Visit: Payer: PPO | Admitting: Internal Medicine

## 2021-02-03 ENCOUNTER — Ambulatory Visit (HOSPITAL_COMMUNITY)
Admission: RE | Admit: 2021-02-03 | Discharge: 2021-02-03 | Disposition: A | Discharge status: Home / Self Care | Payer: PPO | Source: Ambulatory Visit | Attending: Urology | Admitting: Urology

## 2021-02-03 ENCOUNTER — Other Ambulatory Visit: Payer: Self-pay

## 2021-02-03 DIAGNOSIS — N200 Calculus of kidney: Secondary | ICD-10-CM | POA: Diagnosis not present

## 2021-02-03 DIAGNOSIS — N1330 Unspecified hydronephrosis: Secondary | ICD-10-CM | POA: Diagnosis not present

## 2021-02-03 DIAGNOSIS — N135 Crossing vessel and stricture of ureter without hydronephrosis: Secondary | ICD-10-CM | POA: Insufficient documentation

## 2021-02-03 DIAGNOSIS — K682 Retroperitoneal fibrosis: Secondary | ICD-10-CM

## 2021-02-04 ENCOUNTER — Telehealth: Payer: Self-pay

## 2021-02-04 NOTE — Telephone Encounter (Signed)
Patient called requesting a return call to discuss the results of her labwork.  Patient states she had labwork at [facility] and was told they faxed the results to our office.

## 2021-02-04 NOTE — Telephone Encounter (Signed)
Please schedule Autumn Duran for follow up in about 3 months  FYI- Discussed results from labs also after reviewing renal ultrasound study from yesterday 12/8 which is unchanged from the previous. Renal function is normal I recommend she can discontinue the prednsone by halving for 2-4 weeks then off and continue only azathioprine for now.

## 2021-02-04 NOTE — Telephone Encounter (Signed)
LMOM for patient to call and schedule 3 month follow-up appointment. °

## 2021-02-07 ENCOUNTER — Encounter: Payer: Self-pay | Admitting: Urology

## 2021-02-07 ENCOUNTER — Other Ambulatory Visit: Payer: Self-pay

## 2021-02-07 ENCOUNTER — Ambulatory Visit (INDEPENDENT_AMBULATORY_CARE_PROVIDER_SITE_OTHER): Payer: PPO | Admitting: Urology

## 2021-02-07 VITALS — BP 153/73 | HR 64

## 2021-02-07 DIAGNOSIS — N390 Urinary tract infection, site not specified: Secondary | ICD-10-CM

## 2021-02-07 DIAGNOSIS — N200 Calculus of kidney: Secondary | ICD-10-CM | POA: Diagnosis not present

## 2021-02-07 NOTE — Patient Instructions (Signed)
Dietary Guidelines to Help Prevent Kidney Stones Kidney stones are deposits of minerals and salts that form inside your kidneys. Your risk of developing kidney stones may be greater depending on your diet, your lifestyle, the medicines you take, and whether you have certain medical conditions. Most people can lower their chances of developing kidney stones by following the instructions below. Your dietitian may give you more specific instructions depending on your overall health and the type of kidney stones you tend to develop. What are tips for following this plan? Reading food labels  Choose foods with "no salt added" or "low-salt" labels. Limit your salt (sodium) intake to less than 1,500 mg a day. Choose foods with calcium for each meal and snack. Try to eat about 300 mg of calcium at each meal. Foods that contain 200-500 mg of calcium a serving include: 8 oz (237 mL) of milk, calcium-fortifiednon-dairy milk, and calcium-fortifiedfruit juice. Calcium-fortified means that calcium has been added to these drinks. 8 oz (237 mL) of kefir, yogurt, and soy yogurt. 4 oz (114 g) of tofu. 1 oz (28 g) of cheese. 1 cup (150 g) of dried figs. 1 cup (91 g) of cooked broccoli. One 3 oz (85 g) can of sardines or mackerel. Most people need 1,000-1,500 mg of calcium a day. Talk to your dietitian about how much calcium is recommended for you. Shopping Buy plenty of fresh fruits and vegetables. Most people do not need to avoid fruits and vegetables, even if these foods contain nutrients that may contribute to kidney stones. When shopping for convenience foods, choose: Whole pieces of fruit. Pre-made salads with dressing on the side. Low-fat fruit and yogurt smoothies. Avoid buying frozen meals or prepared deli foods. These can be high in sodium. Look for foods with live cultures, such as yogurt and kefir. Choose high-fiber grains, such as whole-wheat breads, oat bran, and wheat cereals. Cooking Do not add  salt to food when cooking. Place a salt shaker on the table and allow each person to add his or her own salt to taste. Use vegetable protein, such as beans, textured vegetable protein (TVP), or tofu, instead of meat in pasta, casseroles, and soups. Meal planning Eat less salt, if told by your dietitian. To do this: Avoid eating processed or pre-made food. Avoid eating fast food. Eat less animal protein, including cheese, meat, poultry, or fish, if told by your dietitian. To do this: Limit the number of times you have meat, poultry, fish, or cheese each week. Eat a diet free of meat at least 2 days a week. Eat only one serving each day of meat, poultry, fish, or seafood. When you prepare animal protein, cut pieces into small portion sizes. For most meat and fish, one serving is about the size of the palm of your hand. Eat at least five servings of fresh fruits and vegetables each day. To do this: Keep fruits and vegetables on hand for snacks. Eat one piece of fruit or a handful of berries with breakfast. Have a salad and fruit at lunch. Have two kinds of vegetables at dinner. Limit foods that are high in a substance called oxalate. These include: Spinach (cooked), rhubarb, beets, sweet potatoes, and Swiss chard. Peanuts. Potato chips, french fries, and baked potatoes with skin on. Nuts and nut products. Chocolate. If you regularly take a diuretic medicine, make sure to eat at least 1 or 2 servings of fruits or vegetables that are high in potassium each day. These include: Avocado. Banana. Orange, prune,   carrot, or tomato juice. Baked potato. Cabbage. Beans and split peas. Lifestyle  Drink enough fluid to keep your urine pale yellow. This is the most important thing you can do. Spread your fluid intake throughout the day. If you drink alcohol: Limit how much you use to: 0-1 drink a day for women who are not pregnant. 0-2 drinks a day for men. Be aware of how much alcohol is in your  drink. In the [place] one drink equals one 12 oz bottle of beer (355 mL), one 5 oz glass of wine (148 mL), or one 1 oz glass of hard liquor (44 mL). Lose weight if told by your health care provider. Work with your dietitian to find an eating plan and weight loss strategies that work best for you. General information Talk to your health care provider and dietitian about taking daily supplements. You may be told the following depending on your health and the cause of your kidney stones: Not to take supplements with vitamin C. To take a calcium supplement. To take a daily probiotic supplement. To take other supplements such as magnesium, fish oil, or vitamin B6. Take over-the-counter and prescription medicines only as told by your health care provider. These include supplements. What foods should I limit? Limit your intake of the following foods, or eat them as told by your dietitian. Vegetables Spinach. Rhubarb. Beets. Canned vegetables. Pickles. Olives. Baked potatoes with skin. Grains Wheat bran. Baked goods. Salted crackers. Cereals high in sugar. Meats and other proteins Nuts. Nut butters. Large portions of meat, poultry, or fish. Salted, precooked, or cured meats, such as sausages, meat loaves, and hot dogs. Dairy Cheese. Beverages Regular soft drinks. Regular vegetable juice. Seasonings and condiments Seasoning blends with salt. Salad dressings. Soy sauce. Ketchup. Barbecue sauce. Other foods Canned soups. Canned pasta sauce. Casseroles. Pizza. Lasagna. Frozen meals. Potato chips. French fries. The items listed above may not be a complete list of foods and beverages you should limit. Contact a dietitian for more information. What foods should I avoid? Talk to your dietitian about specific foods you should avoid based on the type of kidney stones you have and your overall health. Fruits Grapefruit. The item listed above may not be a complete list of foods and beverages you should  avoid. Contact a dietitian for more information. Summary Kidney stones are deposits of minerals and salts that form inside your kidneys. You can lower your risk of kidney stones by making changes to your diet. The most important thing you can do is drink enough fluid. Drink enough fluid to keep your urine pale yellow. Talk to your dietitian about how much calcium you should have each day, and eat less salt and animal protein as told by your dietitian. This information is not intended to replace advice given to you by your health care provider. Make sure you discuss any questions you have with your health care provider. Document Revised: 02/06/2019 Document Reviewed: 02/06/2019 Elsevier Patient Education  2022 Elsevier Inc.  

## 2021-02-07 NOTE — Progress Notes (Signed)
Urological Symptom Review  Patient is experiencing the following symptoms: Frequent urination Leakage of urine (when coughing) Kidney stones  Review of Systems  Gastrointestinal (upper)  : Indigestion/heartburn  Gastrointestinal (lower) : Constipation  Constitutional : Night Sweats  Skin: Negative for skin symptoms  Eyes: Blurred vision  Ear/Nose/Throat : Sinus problems  Hematologic/Lymphatic: Negative for Hematologic/Lymphatic symptoms  Cardiovascular : Negative for cardiovascular symptoms  Respiratory : Cough  Endocrine: Negative for endocrine symptoms  Musculoskeletal: Back pain  Neurological: Negative for neurological symptoms  Psychologic: Negative for psychiatric symptoms

## 2021-02-07 NOTE — Progress Notes (Signed)
02/07/2021 11:46 AM   Autumn Duran [birth year] [id number]  Referring provider: [name], MD [place],  [place] [address]  Followup hydronephrosis and nephrolithiasis   HPI: Ms Puskas is a 70yo here for followup for hydronephrosis and nephrolithiasis. No stone events since last visit. No flank pain. Renal US 02/03/2021 shows stable bilateral hydronephrosis and stable lower pole renal calculi. She denies any worsening LUTS. No other complaints today. 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   PMH: Past Medical History:  Diagnosis Date   Bronchitis, chronic (HCC)    Chronic back pain    Chronic kidney disease    kidney stone s/p stent placement ( removed)   Complication of anesthesia    Compression fracture of L3 lumbar vertebra    Diverticulosis    GERD (gastroesophageal reflux disease)    Hemorrhoids    Hiatal hernia    History of kidney stones    Hx of adenomatous colonic polyps    Hyperlipidemia    Hypertension    Lumbar radicular pain    Osteoporosis 12/10/2017   PONV (postoperative nausea and vomiting)    Smoldering myeloma    Sore throat    T7 vertebral fracture ([facility]     Surgical History: Past Surgical History:  Procedure Laterality Date   BACK SURGERY     BONE MARROW ASPIRATION Left 02/2014   BONE MARROW BIOPSY Left 02/2014   CHOLECYSTECTOMY     COLONOSCOPY  12/18/2011   Procedure: COLONOSCOPY;  Surgeon: DLafayette Dragon MD;  [facility]: [facility];  Service: Endoscopy;  Laterality: N/A;   COLONOSCOPY WITH PROPOFOL N/A 08/03/2017   Procedure: COLONOSCOPY WITH PROPOFOL;  Surgeon: RRogene Houston MD;  [facility];  Service: Endoscopy;  Laterality: N/A;   CYSTOSCOPY/RETROGRADE/URETEROSCOPY/STONE EXTRACTION WITH BASKET Bilateral 04/18/2018   Procedure: CYSTOSCOPY/BILATERAL RETROGRADE/URETEROSCOPY/STONE EXTRACTION WITH BASKET WITH RIGHT STENT PLACEMENT;  Surgeon: MCleon Gustin MD;  [facility]: [facility];  Service:  Urology;  Laterality: Bilateral;  1 HR   ESOPHAGEAL DILATION N/A 08/03/2017   Procedure: ESOPHAGEAL DILATION;  Surgeon: RRogene Houston MD;  [facility];  Service: Endoscopy;  Laterality: N/A;   ESOPHAGOGASTRODUODENOSCOPY  12/18/2011   Procedure: ESOPHAGOGASTRODUODENOSCOPY (EGD);  Surgeon: DLafayette Dragon MD;  [facility];  Service: Endoscopy;  Laterality: N/A;   ESOPHAGOGASTRODUODENOSCOPY (EGD) WITH PROPOFOL N/A 08/03/2017   Procedure: ESOPHAGOGASTRODUODENOSCOPY (EGD) WITH PROPOFOL;  Surgeon: RRogene Houston MD;  [facility];  Service: Endoscopy;  Laterality: N/A;  [place]  Procedure: HOLMIUM LASER APPLICATION;  Surgeon: MCleon Gustin MD;  [facility];  Service: Urology;  Laterality: Right;   LUMBAR LAMINECTOMY FOR EPIDURAL ABSCESS Left 09/22/2013   Procedure: LUMBAR LAMINECTOMY FOR EPIDURAL ABSCESS LEFT LUMBAR FIVE-SACRAL ONE;  Surgeon: HCharlie Pitter MD;  [facility];  Service: Neurosurgery;  Laterality: Left;   RADIOLOGY WITH ANESTHESIA N/A 08/13/2014   Procedure: RADIOLOGY WITH ANESTHESIA ;  Surgeon: Medication Radiologist, MD;  [facility];  Service: Radiology;  Laterality: N/A;   URETERAL STENT PLACEMENT     VERTEBROPLASTY N/A 07/14/2013   Procedure: VERTEBROPLASTY WITH LUMBAR THREE BIOPSY;  Surgeon: HCharlie Pitter MD;  [facility];  Service: Neurosurgery;  Laterality: N/A;  VERTEBROPLASTY WITH LUMBAR THREE BIOPSY    Home Medications:  Allergies as of 02/07/2021  Reactions   Chlorhexidine Gluconate Itching   Pt c/o itching after abd wipes.    Morphine And Related Nausea And Vomiting   Headache   Tetracycline Nausea Only        Medication List        Accurate as of February 07, 2021 11:46 AM. If you have any questions, ask your nurse or doctor.          STOP taking these medications    lidocaine 5 % Commonly  known as: Lidoderm Stopped by: [name], MD       TAKE these medications    azaTHIOprine 50 MG tablet Commonly known as: IMURAN Take 100 mg by mouth daily. What changed: Another medication with the same name was removed. Continue taking this medication, and follow the directions you see here. Changed by: [name], MD   Azelastine HCl 0.15 % Soln Place 1 spray into both nostrils daily.   ergocalciferol 1.25 MG (50000 UT) capsule Commonly known as: VITAMIN D2 Take 1 capsule (50,000 Units total) by mouth once a week.   HYDROcodone-acetaminophen 5-325 MG tablet Commonly known as: NORCO/VICODIN as needed.   hydrocortisone 1 % ointment Apply 1 application topically 2 (two) times daily.   ibuprofen 200 MG tablet Commonly known as: ADVIL Take 400 mg by mouth daily as needed for mild pain.   omeprazole 20 MG capsule Commonly known as: PRILOSEC Take 20 mg by mouth 2 (two) times daily before a meal.   oxyCODONE 20 mg 12 hr tablet Commonly known as: OXYCONTIN Take 20 mg by mouth daily.   polyethylene glycol powder 17 GM/SCOOP powder Commonly known as: GLYCOLAX/MIRALAX Take by mouth.   predniSONE 5 MG tablet Commonly known as: DELTASONE Take 1 tablet (5 mg total) by mouth daily with breakfast.   THERATEARS OP Place 1 drop into both eyes daily as needed (dry eyes).        Allergies:  Allergies  Allergen Reactions   Chlorhexidine Gluconate Itching    Pt c/o itching after abd wipes.    Morphine And Related Nausea And Vomiting    Headache   Tetracycline Nausea Only    Family History: Family History  Problem Relation Age of Onset   Breast cancer Sister 66   Breast cancer Other 53       aunt   Hypertension Mother    COPD Mother    COPD Father    Hypertension Sister    Aortic aneurysm Sister    Hypertension Son     Social History:  reports that she has never smoked. She has never used smokeless tobacco. She reports current alcohol use. She  reports that she does not use drugs.  ROS: All other review of systems were reviewed and are negative except what is noted above in HPI  Physical Exam: BP (!) 153/73   Pulse 64   Constitutional:  Alert and oriented, No acute distress. HEENT: [place] AT, moist mucus membranes.  Trachea midline, no masses. Cardiovascular: No clubbing, cyanosis, or edema. Respiratory: Normal respiratory effort, no increased work of breathing. GI: Abdomen is soft, nontender, nondistended, no abdominal masses GU: No CVA tenderness.  Lymph: No cervical or inguinal lymphadenopathy. Skin: No rashes, bruises or suspicious lesions. Neurologic: Grossly intact, no focal deficits, moving all 4 extremities. Psychiatric: Normal mood and affect.  Laboratory Data: Lab Results  Component Value Date   WBC 8.6 10/11/2020   HGB 14.8 10/11/2020   HCT 45.9 (H) 10/11/2020   MCV 87.9 10/11/2020  PLT 285 10/11/2020    Lab Results  Component Value Date   CREATININE 0.93 10/11/2020    No results found for: PSA  No results found for: TESTOSTERONE  No results found for: HGBA1C  Urinalysis    Component Value Date/Time   COLORURINE YELLOW 08/13/2014 0850   APPEARANCEUR Clear 07/05/2020 1156   LABSPEC 1.017 08/13/2014 0850   PHURINE 6.5 08/13/2014 0850   GLUCOSEU Negative 07/05/2020 [place] 08/13/2014 0850   BILIRUBINUR Negative 07/05/2020 1156   KETONESUR >80 (A) 08/13/2014 0850   PROTEINUR Trace (A) 07/05/2020 1156   PROTEINUR NEGATIVE 08/13/2014 0850   UROBILINOGEN 0.2 08/13/2014 0850   NITRITE Negative 07/05/2020 1156   NITRITE NEGATIVE 08/13/2014 0850   LEUKOCYTESUR 1+ (A) 07/05/2020 1156    Lab Results  Component Value Date   LABMICR See below: 07/05/2020   WBCUA 6-10 (A) 07/05/2020   LABEPIT >10 (A) 07/05/2020   BACTERIA Many (A) 07/05/2020    Pertinent Imaging: Renal US 02/03/2021: Images reviewed and discussed with the patient  No results found for this or any previous  visit.  Results for orders placed during the hospital encounter of 12/18/13  US Venous Img Lower Bilateral  Narrative CLINICAL DATA:  Leg swelling  EXAM: BILATERAL LOWER EXTREMITY VENOUS DOPPLER ULTRASOUND  TECHNIQUE: Gray-scale sonography with graded compression, as well as color Doppler and duplex ultrasound were performed to evaluate the lower extremity deep venous systems from the level of the common femoral vein and including the common femoral, femoral, profunda femoral, popliteal and calf veins including the posterior tibial, peroneal and gastrocnemius veins when visible. The superficial great saphenous vein was also interrogated. Spectral Doppler was utilized to evaluate flow at rest and with distal augmentation maneuvers in the common femoral, femoral and popliteal veins.  COMPARISON:  None.  FINDINGS: RIGHT LOWER EXTREMITY  Common Femoral Vein: No evidence of thrombus. Normal compressibility, respiratory phasicity and response to augmentation.  Saphenofemoral Junction: No evidence of thrombus. Normal compressibility and flow on color Doppler imaging.  Profunda Femoral Vein: No evidence of thrombus. Normal compressibility and flow on color Doppler imaging.  Femoral Vein: No evidence of thrombus. Normal compressibility, respiratory phasicity and response to augmentation.  Popliteal Vein: No evidence of thrombus. Normal compressibility, respiratory phasicity and response to augmentation.  Calf Veins: No evidence of thrombus. Normal compressibility and flow on color Doppler imaging.  Superficial Great Saphenous Vein: No evidence of thrombus. Normal compressibility and flow on color Doppler imaging.  Venous Reflux:  None.  Other Findings:  None.  LEFT LOWER EXTREMITY  Common Femoral Vein: No evidence of thrombus. Normal compressibility, respiratory phasicity and response to augmentation.  Saphenofemoral Junction: No evidence of thrombus.  Normal compressibility and flow on color Doppler imaging.  Profunda Femoral Vein: No evidence of thrombus. Normal compressibility and flow on color Doppler imaging.  Femoral Vein: No evidence of thrombus. Normal compressibility, respiratory phasicity and response to augmentation.  Popliteal Vein: No evidence of thrombus. Normal compressibility, respiratory phasicity and response to augmentation.  Calf Veins: Not well visualized.  Superficial Great Saphenous Vein: No evidence of thrombus. Normal compressibility and flow on color Doppler imaging.  Venous Reflux:  None.  Other Findings:  None.  IMPRESSION: No evidence of deep venous thrombosis.   Electronically Signed By: Kathreen Devoid On: 12/18/2013 13:37  No results found for this or any previous visit.  No results found for this or any previous visit.  Results for orders placed during the hospital encounter of  02/03/21  Ultrasound renal complete  Narrative CLINICAL DATA:  Retroperitoneal fibrosis hydronephrosis  EXAM: RENAL / URINARY TRACT ULTRASOUND COMPLETE  COMPARISON:  Ultrasound 23,022, 07/12/2020, 05/10/2020, 02/16/2020, CT 02/26/2019  FINDINGS: Right Kidney:  Renal measurements: 11.7 x 5.8 x 4.7 cm = volume: 166.2 mL. Cortical echogenicity within normal limits. Mild right hydronephrosis without significant change. Multiple echogenic shadowing areas consistent with kidney stones. RO the largest measures 12 mm at upper pole.  Left Kidney:  Renal measurements: 10.6 x 6.1 x 5.4 cm = volume: 93.1 mL. Cortical echogenicity within normal limits. No mass. Moderate severe hydronephrosis without significant change. Shadowing stone at the midpole measuring 7 mm.  Bladder:  Appears normal for degree of bladder distention.  Other:  None.  IMPRESSION: 1. Similar degree of bilateral hydronephrosis, mild on the right and moderate severe on the left. 2. Redemonstrated multiple bilateral kidney  stones.   Electronically Signed By: Donavan Foil M.D. On: 02/03/2021 18:48  No results found for this or any previous visit.  No results found for this or any previous visit.  Results for orders placed during the hospital encounter of 02/26/19  CT RENAL STONE STUDY  Narrative CLINICAL DATA:  Right lower quadrant and right flank pain. History of nephrolithiasis. Prior cholecystectomy.  EXAM: CT ABDOMEN AND PELVIS WITHOUT CONTRAST  TECHNIQUE: Multidetector CT imaging of the abdomen and pelvis was performed following the standard protocol without IV contrast.  COMPARISON:  02/24/2019 renal sonogram. 04/17/2018 unenhanced CT abdomen/pelvis.  FINDINGS: Lower chest: Mild right middle lobe and lingular and moderate left lower lobe cylindrical bronchiectasis with associated thick bandlike scarring, similar to slightly worsened in the interval. Right lower lobe 3 mm solid pulmonary nodule (series 4/image 3), stable, considered benign.  Hepatobiliary: Normal liver size. Simple 2.5 cm posterior left liver lobe cyst. No additional liver lesions. Punctate granulomatous right liver dome calcification is unchanged. Cholecystectomy. Stable dilated CBD, 8 mm diameter, within normal post cholecystectomy limits, without significant intrahepatic biliary ductal dilatation.  Pancreas: Normal, with no mass or duct dilation.  Spleen: Normal size. No mass.  Adrenals/Urinary Tract: Normal adrenals. Nonobstructing 2 mm upper, 3 mm interpolar and 3 mm lower right renal stones. Nonobstructing 1 mm upper left renal stones. Subcentimeter hypodense renal cortical lesion in the medial upper right kidney is too small to characterize and is unchanged, considered benign. No contour deforming renal masses. Mild right hydronephrosis with moderately dilated right extrarenal pelvis with somewhat abrupt caliber transition at the right ureteropelvic junction, increased since 08/10/2018 CT. Moderate left  hydronephrosis with abrupt caliber transition at the left ureteropelvic junction, mildly increased since 08/10/2018 CT. Normal caliber ureters. No UPJ or ureteral stones. Normal bladder, with no bladder stones.  Stomach/Bowel: Curvilinear hyperdensity adjacent to the esophagogastric junction. Stomach is nondistended with no acute gastric abnormality. Normal caliber small bowel with no small bowel wall thickening. Normal appendix. Normal large bowel with no diverticulosis, large bowel wall thickening or pericolonic fat stranding.  Vascular/Lymphatic: Atherosclerotic nonaneurysmal abdominal aorta. There is worsening fat stranding throughout the retroperitoneum surrounding the IVC and abdominal aorta and proximal ureters bilaterally. No pathologically enlarged lymph nodes in the abdomen or pelvis.  Reproductive: Grossly normal uterus. No adnexal mass. Bilateral tubal ligation clips in place.  Other: No pneumoperitoneum, ascites or focal fluid collection.  Musculoskeletal: No aggressive appearing focal osseous lesions. Severe degenerative disc disease at L3-4. Vertebroplasty material again noted within mild-to-moderate L3 vertebral compression fracture. Prominent bilateral lower lumbar facet arthropathy with stable grade 1 anterolisthesis at L5-S1.  IMPRESSION: 1. 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 2. Mild right and moderate left hydronephrosis with abrupt caliber transitions at the UPJ bilaterally, without evidence of obstructing stone or mass on this noncontrast scan. Findings could represent progressive proximal ureteral obstruction due to retroperitoneal fibrosis. 3. Tiny nonobstructing stones in both kidneys. 4. Mild to moderate bronchiectasis and thick bandlike scarring at both lung bases, similar to slightly worsened. 5.   Aortic Atherosclerosis (ICD10-I70.0).   Electronically Signed By: Ilona Sorrel M.D. On: 02/26/2019 15:50   Assessment & Plan:    1. Nephrolithiasis -RTC 6 months with renal US -Lemonade diet -decrease Sundrop consumption to 12oz daily - Urinalysis, Routine w reflex microscopic   No follow-ups on file.  [name], MD  [facility] [place]

## 2021-02-08 LAB — MICROSCOPIC EXAMINATION
Epithelial Cells (non renal): >10 /hpf — AB (ref 0–10)
Renal Epithel, UA: NONE SEEN /hpf
WBC, UA: >30 /hpf — AB (ref 0–5)

## 2021-02-08 LAB — URINALYSIS, ROUTINE W REFLEX MICROSCOPIC
Bilirubin, UA: NEGATIVE
Glucose, UA: NEGATIVE
Ketones, UA: NEGATIVE
Nitrite, UA: NEGATIVE
Specific Gravity, UA: 1.02 (ref 1.005–1.030)
Urobilinogen, Ur: 0.2 mg/dL (ref 0.2–1.0)
pH, UA: 6 (ref 5.0–7.5)

## 2021-02-10 NOTE — Telephone Encounter (Signed)
LMOM for patient to schedule a follow up for 3 months.

## 2021-02-11 LAB — URINE CULTURE

## 2021-02-16 DIAGNOSIS — Z299 Encounter for prophylactic measures, unspecified: Secondary | ICD-10-CM | POA: Diagnosis not present

## 2021-02-16 DIAGNOSIS — I10 Essential (primary) hypertension: Secondary | ICD-10-CM | POA: Diagnosis not present

## 2021-02-16 DIAGNOSIS — J329 Chronic sinusitis, unspecified: Secondary | ICD-10-CM | POA: Diagnosis not present

## 2021-02-16 DIAGNOSIS — Z2821 Immunization not carried out because of patient refusal: Secondary | ICD-10-CM | POA: Diagnosis not present

## 2021-03-07 ENCOUNTER — Other Ambulatory Visit: Payer: Self-pay | Admitting: Urology

## 2021-03-07 DIAGNOSIS — Z1231 Encounter for screening mammogram for malignant neoplasm of breast: Secondary | ICD-10-CM

## 2021-03-10 ENCOUNTER — Telehealth: Payer: Self-pay | Admitting: Internal Medicine

## 2021-03-10 NOTE — Telephone Encounter (Signed)
[employer] left a message regarding the patient. They stated the PA for Azathioprine is rejecting on the pharmacy end. They request a call back. [phone] option 5

## 2021-03-11 ENCOUNTER — Other Ambulatory Visit: Payer: Self-pay | Admitting: Internal Medicine

## 2021-03-11 MED ORDER — AZATHIOPRINE 50 MG PO TABS: 100.0000 mg | ORAL_TABLET | Freq: Every day | ORAL | 1 refills | Status: DC

## 2021-03-11 NOTE — Telephone Encounter (Signed)
Patient called the office stating she tried to fill her Azathioprine and her pharmacy told her to contact us for refills. Patient states she is out of medication.

## 2021-03-11 NOTE — Telephone Encounter (Signed)
Next Visit: 05/02/2021  Last Visit: 01/31/2021  Last Fill: 01/12/2021  DX: Retroperitoneal fibrosis  Current Dose per office note on 01/31/2021: azathioprine 100 mg p.o. daily.   Labs: 11/17/2020 sodium 145  Okay to refill imuran?

## 2021-03-11 NOTE — Telephone Encounter (Signed)
I returned the call to [facility] and initiated an URGENT PA via telephone. I spoke with Jasmin and was advised I would have a response by the end of the day, today. I have called patient to advise.

## 2021-03-16 NOTE — Telephone Encounter (Signed)
Contacted [facility] to see if they could push through the medication to make sure PA was authorized. Pharmacy Tech states it was and the prescription was filled on 03/13/2021.

## 2021-03-21 ENCOUNTER — Encounter (HOSPITAL_COMMUNITY): Payer: Self-pay | Admitting: Hematology

## 2021-03-28 ENCOUNTER — Telehealth: Payer: Self-pay | Admitting: *Deleted

## 2021-03-28 ENCOUNTER — Ambulatory Visit: Payer: PPO

## 2021-03-28 NOTE — Telephone Encounter (Signed)
Patient states she stopped her Prednisone per instructions about 3 weeks ago. Patient states she has continued her Imuran. Patient states she is having aching in her hands and bones. Patient states she feels terrible and wants to know what she should do. Please advise.

## 2021-04-01 ENCOUNTER — Encounter: Payer: Self-pay | Admitting: *Deleted

## 2021-04-01 NOTE — Telephone Encounter (Signed)
Tried to return call, number straight to VM which is full.  Message sent to patient via my chart to relay message from Dr. Benjamine Mola.

## 2021-04-01 NOTE — Telephone Encounter (Signed)
Tried to return call, number straight to VM which is full. Symptoms might be coming from stopping the prednisone, since she was on this at a considerable dose for months. Also sometimes the prednisone was covering up existing body pains but it can be hard to say which. For the short term she can go back to taking the low dose prednisone 2.5 mg daily. We can send a new prescription if she is out.

## 2021-04-25 NOTE — Telephone Encounter (Signed)
Patient returned call to the office. Patient advised we tried reaching her by phone. Patient advised phone goes straight to voicemail and your voicemail is full so we are unable to leave a message. Patient advised  per Dr. Benjamine Mola, the symptoms might be coming from stopping the prednisone, since you were on this at a considerable dose for months. Also sometimes the prednisone was covering up existing body pains but it can be hard to say which. For the short term you can go back to taking the low dose prednisone 2.5 mg daily. Patient states she does not really want to go back on the Prednisone. Patient has an appointment on 05/02/2021 and will discuss at that appointment.

## 2021-05-01 NOTE — Progress Notes (Signed)
Office Visit Note  Patient: Autumn Duran             Date of Birth: [birth year]           MRN: [id number]             PCP: [name], MD Referring: [name], MD Visit Date: 05/02/2021   Subjective:  Follow-up (Doing good, bil hand aching, )   History of Present Illness: Autumn Duran is a 71 y.o. female here for follow up for retroperitoneal fibrosis on azathioprine 100 mg daily. She discontinued prednisone after tapering she reported some increased pain in her hand and bones. This started about 3 weeks after she stopped the 2.5 mg prednisone. She also noticed a large increase in pedal edema with stopping prednisone that cleared up after 3 doses of lasix prescribed from PCP office. She has some hand pain worst in the 5th fingers and low back pain. Her left leg and knee gave her some trouble also more stiffness and weakness than pain. She resumed taking prednisone 2.5 mg about 2 weeks ago with a large improvement in symptoms again.  She had one episode of right sided abdominal pain starting to the side and moving to the center in lower abdomen then resolved. She took one percocet due to severity of pain. She never saw any frank blood or stone during this.  Previous HPI 01/31/21 Autumn Duran is a 71 y.o. female here for follow up of retroperitoneal fibrosis with hydronephrosis on azathioprine 100 mg PO daily and prednisone 5 mg daily. She tapered prednisone down from 15 mg to 5 mg daily since last visit without noticing specific problems. She had URI symptoms with congestion and drainage treated with amoxicillin. Weight gain with central adiposity increase she reports 16 pounds total. She notices some sensitivity with monitor bumps playing with her grandson. She is scheduled for renal ultrasound on 12/8 for monitoring by Dr. Alyson Ingles.   Previous HPI 10/11/20 Autumn Duran is a 71 y.o. female here for follow up for retroperitoneal fibrosis on prednisone 15 mg daily and azathioprine  100 mg p.o. daily.  Since our last visit she became ill with COVID-pneumonia symptoms took the paxlovid treatment for this and respiratory symptoms recovered completely.  She feels overall well recovered from the problem.  The left-sided chest wall and flank pain and left arm pain from her previous fall are also improved further.  She is noticing some swelling that comes and goes in her lower extremities more than before.  Lower abdominal skin rash is improved   Review of Systems  Constitutional:  Positive for fatigue.  HENT:  Positive for mouth dryness.   Eyes:  Positive for dryness.  Respiratory:  Negative for shortness of breath.   Cardiovascular:  Positive for swelling in legs/feet.  Gastrointestinal:  Positive for constipation.  Endocrine: Positive for excessive thirst.  Genitourinary:  Negative for difficulty urinating.  Musculoskeletal:  Positive for joint pain, joint pain, joint swelling, morning stiffness and muscle tenderness.  Skin:  Negative for rash.  Allergic/Immunologic: Negative for susceptible to infections.  Neurological:  Negative for numbness.  Hematological:  Negative for bruising/bleeding tendency.  Psychiatric/Behavioral:  Positive for sleep disturbance.    PMFS History:  Patient Active Problem List   Diagnosis Date Noted   Osteoarthritis of both hands 05/02/2021   COVID-19 10/11/2020   Right-sided back pain 10/11/2020   Left-sided chest wall pain 08/09/2020   High risk medication use 08/09/2020  Varicose veins of leg with swelling, bilateral 07/05/2020   Primary hypercholesterolemia 05/11/2020   Compression fracture of L1 lumbar vertebra (HCC) 05/11/2020   Bilateral nephrolithiasis 05/11/2020   Bilateral hydronephrosis 04/14/2020   Retroperitoneal fibrosis 03/09/2020   Degenerative scoliosis 03/05/2020   Degenerative spondylolisthesis 03/05/2020   Opioid dependence ([facility]) 09/18/2019   Elevated blood-pressure reading, without diagnosis of hypertension  01/08/2019   Closed fracture of proximal end of right humerus 06/26/2018   Paraesophageal hernia 12/12/2017   Osteoporosis 12/10/2017   Dysphagia 06/26/2017   Gastroesophageal reflux disease 06/26/2017   Hx of colonic polyps 06/26/2017   Nausea with vomiting 10/12/2014   Acute low back pain 08/13/2014   Epidural abscess 08/13/2014   Hypertension    Chronic back pain    Essential hypertension    Other iron deficiency anemia 04/10/2014   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[place]) 03/23/2014   Right hip pain 12/23/2013   Epidural abscess, L2-L5 11/04/2013   Lumbar compression fracture ([place]) 07/12/2013   Compression fracture 07/12/2013   Benign neoplasm of colon 12/18/2011   Esophageal reflux 12/18/2011   Other dysphagia 12/18/2011    Past Medical History:  Diagnosis Date   Bronchitis, chronic (HCC)    Chronic back pain    Chronic kidney disease    kidney stone s/p stent placement ( removed)   Complication of anesthesia    Compression fracture of L3 lumbar vertebra    Diverticulosis    GERD (gastroesophageal reflux disease)    Hemorrhoids    Hiatal hernia    History of kidney stones    Hx of adenomatous colonic polyps    Hyperlipidemia    Hypertension    Lumbar radicular pain    Osteoporosis 12/10/2017   PONV (postoperative nausea and vomiting)    Smoldering myeloma    Sore throat    T7 vertebral fracture ([place])     Family History  Problem Relation Age of Onset   Breast cancer Sister 38   Breast cancer Other 70       aunt   Hypertension Mother    COPD Mother    COPD Father    Hypertension Sister    Aortic aneurysm Sister    Hypertension Son    Past Surgical History:  Procedure Laterality Date   BACK SURGERY     BONE MARROW ASPIRATION Left 02/2014   BONE MARROW BIOPSY Left 02/2014   CHOLECYSTECTOMY     COLONOSCOPY  12/18/2011   Procedure: COLONOSCOPY;  Surgeon: Lafayette Dragon, MD;  Location: [facility];  Service: Endoscopy;  Laterality: N/A;   COLONOSCOPY WITH PROPOFOL N/A  08/03/2017   Procedure: COLONOSCOPY WITH PROPOFOL;  Surgeon: Rogene Houston, MD;  Location: [facility];  Service: Endoscopy;  Laterality: N/A;   CYSTOSCOPY/RETROGRADE/URETEROSCOPY/STONE EXTRACTION WITH BASKET Bilateral 04/18/2018   Procedure: CYSTOSCOPY/BILATERAL RETROGRADE/URETEROSCOPY/STONE EXTRACTION WITH BASKET WITH RIGHT STENT PLACEMENT;  Surgeon: Cleon Gustin, MD;  Location: [facility];  Service: Urology;  Laterality: Bilateral;  1 HR   ESOPHAGEAL DILATION N/A 08/03/2017   Procedure: ESOPHAGEAL DILATION;  Surgeon: Rogene Houston, MD;  Location: [facility];  Service: Endoscopy;  Laterality: N/A;   ESOPHAGOGASTRODUODENOSCOPY  12/18/2011   Procedure: ESOPHAGOGASTRODUODENOSCOPY (EGD);  Surgeon: Lafayette Dragon, MD;  Location: [facility];  Service: Endoscopy;  Laterality: N/A;   ESOPHAGOGASTRODUODENOSCOPY (EGD) WITH PROPOFOL N/A 08/03/2017   Procedure: ESOPHAGOGASTRODUODENOSCOPY (EGD) WITH PROPOFOL;  Surgeon: Rogene Houston, MD;  Location: [facility];  Service: Endoscopy;  Laterality: N/A;  pt knows to arrive  at [place] Right 0/37/0488   Procedure: HOLMIUM LASER APPLICATION;  Surgeon: Cleon Gustin, MD;  Location: [facility];  Service: Urology;  Laterality: Right;   LUMBAR LAMINECTOMY FOR EPIDURAL ABSCESS Left 09/22/2013   Procedure: LUMBAR LAMINECTOMY FOR EPIDURAL ABSCESS LEFT LUMBAR FIVE-SACRAL ONE;  Surgeon: Charlie Pitter, MD;  Location: [place] [facility];  Service: Neurosurgery;  Laterality: Left;   RADIOLOGY WITH ANESTHESIA N/A 08/13/2014   Procedure: RADIOLOGY WITH ANESTHESIA ;  Surgeon: Medication Radiologist, MD;  Location: [place] [facility];  Service: Radiology;  Laterality: N/A;   URETERAL STENT PLACEMENT     VERTEBROPLASTY N/A 07/14/2013   Procedure: VERTEBROPLASTY WITH LUMBAR THREE BIOPSY;  Surgeon: Charlie Pitter, MD;  Location: [place] [facility];  Service: Neurosurgery;  Laterality: N/A;  VERTEBROPLASTY WITH LUMBAR THREE BIOPSY   Social History    Social History Narrative   Not on file   There is no immunization history for the selected administration types on file for this patient.   Objective: Vital Signs: BP 128/70 (BP Location: Left Arm, Patient Position: Sitting, Cuff Size: Normal)   Pulse 65   Resp 16   Ht '5\' 1"'  (1.549 m)   Wt 170 lb (77.1 kg)   BMI 32.12 kg/m    Physical Exam Constitutional:      Appearance: She is obese.  Cardiovascular:     Rate and Rhythm: Normal rate and regular rhythm.  Pulmonary:     Effort: Pulmonary effort is normal.     Breath sounds: Normal breath sounds.  Abdominal:     Tenderness: There is no right CVA tenderness or left CVA tenderness.  Musculoskeletal:     Right lower leg: No edema.     Left lower leg: No edema.  Skin:    General: Skin is warm and dry.     Findings: No rash.  Neurological:     General: No focal deficit present.     Mental Status: She is alert.     Deep Tendon Reflexes: Reflexes normal.  Psychiatric:        Mood and Affect: Mood normal.     Musculoskeletal Exam:  Shoulders full ROM no tenderness or swelling Elbows full ROM no tenderness or swelling Wrists full ROM no tenderness or swelling, small mobile nodule on dorsum of left wrist Fingers full ROM DIP heberdon's nodules present no palpable synovitis Low back midline and bilateral paraspinal muscle tenderness to palpation Knees full ROM no tenderness or swelling Ankles full ROM no tenderness or swelling MTPs full ROM no tenderness or swelling   Investigation: No additional findings.  Imaging: No results found.  Recent Labs: Lab Results  Component Value Date   WBC 8.6 10/11/2020   HGB 14.8 10/11/2020   PLT 285 10/11/2020   NA 141 10/11/2020   K 4.9 10/11/2020   CL 105 10/11/2020   CO2 29 10/11/2020   GLUCOSE 96 10/11/2020   BUN 18 10/11/2020   CREATININE 0.93 10/11/2020   BILITOT 0.4 10/11/2020   ALKPHOS 79 06/12/2019   AST 10 10/11/2020   ALT 8 10/11/2020   PROT 6.4 10/11/2020    ALBUMIN 3.5 06/12/2019   CALCIUM 10.1 10/11/2020   GFRAA 67 08/09/2020    Speciality Comments: No specialty comments available.  Procedures:  No procedures performed Allergies: Chlorhexidine gluconate, Morphine and related, and Tetracycline   Assessment / Plan:     Visit Diagnoses: Retroperitoneal fibrosis - Plan: Sedimentation rate, azaTHIOprine (  IMURAN) 50 MG tablet, predniSONE (DELTASONE) 2.5 MG tablet  Has experienced some episodic flank pain but abdominal symptoms and low back overall less than in the past.  Rechecking sedimentation rate for disease activity monitoring.  Plan to continue the azathioprine 100 mg daily.  Increase in joint pains of multiple areas likely from the steroid removal.  I recommended she could resume 2.5 mg daily dose I think the benefit will outweigh systemic risk from continuing such a low dose for at least a while longer.  High risk medication use - Plan: CBC with Differential/Platelet, COMPLETE METABOLIC PANEL WITH GFR  CBC and CMP for azathioprine medication monitoring.  Bilateral nephrolithiasis  Bilateral stones but do not appear to be obstructive on most recent examination.  She is having ongoing monitoring with renal ultrasound to reassess if hydronephrosis is redeveloping.  Primary osteoarthritis of both hands  Pain worse today probably due to stopping the prednisone.  Azathioprine is completely ineffectual for osteoarthritis pain.  Resuming low-dose prednisone as above.  Orders: Orders Placed This Encounter  Procedures   Sedimentation rate   CBC with Differential/Platelet   COMPLETE METABOLIC PANEL WITH GFR   Meds ordered this encounter  Medications   azaTHIOprine (IMURAN) 50 MG tablet    Sig: Take 2 tablets (100 mg total) by mouth daily.    Dispense:  180 tablet    Refill:  1   predniSONE (DELTASONE) 2.5 MG tablet    Sig: Take 1 tablet (2.5 mg total) by mouth daily with breakfast.    Dispense:  90 tablet    Refill:  1      Follow-Up Instructions: Return in about 3 months (around 08/02/2021) for RPF on AZA/GC f/u 68mo.   CCollier Salina MD  Note - This record has been created using [employer]  Chart creation errors have been sought, but may not always  have been located. Such creation errors do not reflect on  the standard of medical care.

## 2021-05-02 ENCOUNTER — Ambulatory Visit: Payer: Medicare Other | Admitting: Internal Medicine

## 2021-05-02 ENCOUNTER — Other Ambulatory Visit: Payer: Self-pay

## 2021-05-02 ENCOUNTER — Encounter: Payer: Self-pay | Admitting: Internal Medicine

## 2021-05-02 VITALS — BP 128/70 | HR 65 | Resp 16 | Ht 61.0 in | Wt 170.0 lb

## 2021-05-02 DIAGNOSIS — M19041 Primary osteoarthritis, right hand: Secondary | ICD-10-CM | POA: Diagnosis not present

## 2021-05-02 DIAGNOSIS — N200 Calculus of kidney: Secondary | ICD-10-CM | POA: Diagnosis not present

## 2021-05-02 DIAGNOSIS — M19042 Primary osteoarthritis, left hand: Secondary | ICD-10-CM

## 2021-05-02 DIAGNOSIS — Z79899 Other long term (current) drug therapy: Secondary | ICD-10-CM | POA: Diagnosis not present

## 2021-05-02 DIAGNOSIS — N135 Crossing vessel and stricture of ureter without hydronephrosis: Secondary | ICD-10-CM | POA: Diagnosis not present

## 2021-05-02 MED ORDER — AZATHIOPRINE 50 MG PO TABS: 100.0000 mg | ORAL_TABLET | Freq: Every day | ORAL | 1 refills | Status: DC

## 2021-05-02 MED ORDER — PREDNISONE 2.5 MG PO TABS: 2.5000 mg | ORAL_TABLET | Freq: Every day | ORAL | 1 refills | Status: DC

## 2021-05-03 LAB — CBC WITH DIFFERENTIAL/PLATELET
Absolute Monocytes: 504 cells/uL (ref 200–950)
Basophils Absolute: 69 cells/uL (ref 0–200)
Basophils Relative: 1 %
Eosinophils Absolute: 262 cells/uL (ref 15–500)
Eosinophils Relative: 3.8 %
HCT: 43.1 % (ref 35.0–45.0)
Hemoglobin: 13.8 g/dL (ref 11.7–15.5)
Lymphs Abs: 1842 cells/uL (ref 850–3900)
MCH: 28.2 pg (ref 27.0–33.0)
MCHC: 32 g/dL (ref 32.0–36.0)
MCV: 88 fL (ref 80.0–100.0)
MPV: 10.6 fL (ref 7.5–12.5)
Monocytes Relative: 7.3 %
Neutro Abs: 4223 cells/uL (ref 1500–7800)
Neutrophils Relative %: 61.2 %
Platelets: 270 10*3/uL (ref 140–400)
RBC: 4.9 10*6/uL (ref 3.80–5.10)
RDW: 13.4 % (ref 11.0–15.0)
Total Lymphocyte: 26.7 %
WBC: 6.9 10*3/uL (ref 3.8–10.8)

## 2021-05-03 LAB — COMPLETE METABOLIC PANEL WITH GFR
AG Ratio: 1.8 (calc) (ref 1.0–2.5)
ALT: 6 U/L (ref 6–29)
AST: 16 U/L (ref 10–35)
Albumin: 4.1 g/dL (ref 3.6–5.1)
Alkaline phosphatase (APISO): 47 U/L (ref 37–153)
BUN/Creatinine Ratio: 16 (calc) (ref 6–22)
BUN: 18 mg/dL (ref 7–25)
CO2: 28 mmol/L (ref 20–32)
Calcium: 10.4 mg/dL (ref 8.6–10.4)
Chloride: 106 mmol/L (ref 98–110)
Creat: 1.11 mg/dL — ABNORMAL HIGH (ref 0.60–1.00)
Globulin: 2.3 g/dL (calc) (ref 1.9–3.7)
Glucose, Bld: 76 mg/dL (ref 65–99)
Potassium: 4.3 mmol/L (ref 3.5–5.3)
Sodium: 141 mmol/L (ref 135–146)
Total Bilirubin: 0.6 mg/dL (ref 0.2–1.2)
Total Protein: 6.4 g/dL (ref 6.1–8.1)
eGFR: 53 mL/min/{1.73_m2} — ABNORMAL LOW (ref >=60)

## 2021-05-03 LAB — SEDIMENTATION RATE: Sed Rate: 2 mm/h (ref 0–30)

## 2021-05-09 NOTE — Progress Notes (Signed)
Kidney function slightly lower than 6 months ago but probably not a significant difference. Recommend continuing current azathioprine 100 mg daily and prednisone 2.5 mg daily.

## 2021-05-11 ENCOUNTER — Encounter: Payer: Self-pay | Admitting: *Deleted

## 2021-07-26 NOTE — Progress Notes (Signed)
Office Visit Note  Patient: Autumn Duran             Date of Birth: 08-Aug-1950           MRN: [id number]             PCP: [name], MD Referring: [name], MD Visit Date: 08/02/2021   Subjective:  Follow-up (Doing good)   History of Present Illness: Autumn Duran is a 71 y.o. female here for follow up for retroperitoneal fibrosis on azathioprine 100 mg daily. She is doing well overall she had one episode of right flank and lower abdominal pain lasting about 1 week in duration. She also started Diovan 80-12.37m for hypertension with improvement in headaches and blood pressure. She is not requiring and furosemide and no recent edema. She does report some increase in urinary frequency during the day times, with occasional leaking during coughing or sneezing. No pain with urination though. She does not recall exactly when this started.   Previous HPI 05/02/2021 Autumn SAUSEDAis a 71y.o. female here for follow up for retroperitoneal fibrosis on azathioprine 100 mg daily. She discontinued prednisone after tapering she reported some increased pain in her hand and bones. This started about 3 weeks after she stopped the 2.5 mg prednisone. She also noticed a large increase in pedal edema with stopping prednisone that cleared up after 3 doses of lasix prescribed from PCP office. She has some hand pain worst in the 5th fingers and low back pain. Her left leg and knee gave her some trouble also more stiffness and weakness than pain. She resumed taking prednisone 2.5 mg about 2 weeks ago with a large improvement in symptoms again.  She had one episode of right sided abdominal pain starting to the side and moving to the center in lower abdomen then resolved. She took one percocet due to severity of pain. She never saw any frank blood or stone during this.   Previous HPI 01/31/21 Autumn LOSANOis a 71y.o. female here for follow up of retroperitoneal fibrosis with hydronephrosis on  azathioprine 100 mg PO daily and prednisone 5 mg daily. She tapered prednisone down from 15 mg to 5 mg daily since last visit without noticing specific problems. She had URI symptoms with congestion and drainage treated with amoxicillin. Weight gain with central adiposity increase she reports 16 pounds total. She notices some sensitivity with monitor bumps playing with her grandson. She is scheduled for renal ultrasound on 12/8 for monitoring by Dr. MAlyson Ingles   Previous HPI 10/11/20 Autumn MALAKis a 71y.o. female here for follow up for retroperitoneal fibrosis on prednisone 15 mg daily and azathioprine 100 mg p.o. daily.  Since our last visit she became ill with COVID-pneumonia symptoms took the paxlovid treatment for this and respiratory symptoms recovered completely.  She feels overall well recovered from the problem.  The left-sided chest wall and flank pain and left arm pain from her previous fall are also improved further.  She is noticing some swelling that comes and goes in her lower extremities more than before.  Lower abdominal skin rash is improved   Review of Systems  Constitutional:  Positive for fatigue.  HENT:  Positive for mouth dryness.   Eyes:  Positive for dryness.  Respiratory:  Negative for shortness of breath.   Cardiovascular:  Negative for swelling in legs/feet.  Gastrointestinal:  Positive for constipation.  Endocrine: Positive for cold intolerance and heat intolerance.  Genitourinary:  Negative for difficulty urinating.  Musculoskeletal:  Negative for morning stiffness.  Skin:  Negative for rash.  Allergic/Immunologic: Negative for susceptible to infections.  Neurological:  Negative for numbness.  Hematological:  Negative for bruising/bleeding tendency.  Psychiatric/Behavioral:  Positive for sleep disturbance.    PMFS History:  Patient Active Problem List   Diagnosis Date Noted   Urinary frequency 08/02/2021   Osteoarthritis of both hands 05/02/2021   COVID-19  10/11/2020   Right-sided back pain 10/11/2020   Left-sided chest wall pain 08/09/2020   High risk medication use 08/09/2020   Varicose veins of leg with swelling, bilateral 07/05/2020   Primary hypercholesterolemia 05/11/2020   Compression fracture of L1 lumbar vertebra ([place]) 05/11/2020   Bilateral nephrolithiasis 05/11/2020   Bilateral hydronephrosis 04/14/2020   Retroperitoneal fibrosis 03/09/2020   Degenerative scoliosis 03/05/2020   Degenerative spondylolisthesis 03/05/2020   Opioid dependence ([place]) 09/18/2019   Elevated blood-pressure reading, without diagnosis of hypertension 01/08/2019   Closed fracture of proximal end of right humerus 06/26/2018   Paraesophageal hernia 12/12/2017   Osteoporosis 12/10/2017   Dysphagia 06/26/2017   Gastroesophageal reflux disease 06/26/2017   Hx of colonic polyps 06/26/2017   Nausea with vomiting 10/12/2014   Acute low back pain 08/13/2014   Epidural abscess 08/13/2014   Hypertension    Chronic back pain    Essential hypertension    Other iron deficiency anemia 04/10/2014   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[place]) 03/23/2014   Right hip pain 12/23/2013   Epidural abscess, L2-L5 11/04/2013   Lumbar compression fracture ([place]) 07/12/2013   Compression fracture 07/12/2013   Benign neoplasm of colon 12/18/2011   Esophageal reflux 12/18/2011   Other dysphagia 12/18/2011    Past Medical History:  Diagnosis Date   Bronchitis, chronic (HCC)    Chronic back pain    Chronic kidney disease    kidney stone s/p stent placement ( removed)   Complication of anesthesia    Compression fracture of L3 lumbar vertebra    Diverticulosis    GERD (gastroesophageal reflux disease)    Hemorrhoids    Hiatal hernia    History of kidney stones    Hx of adenomatous colonic polyps    Hyperlipidemia    Hypertension    Lumbar radicular pain    Osteoporosis 12/10/2017   PONV (postoperative nausea and vomiting)    Smoldering myeloma    Sore throat    T7 vertebral  fracture ([place])     Family History  Problem Relation Age of Onset   Breast cancer Sister 93   Breast cancer Other 1       aunt   Hypertension Mother    COPD Mother    COPD Father    Hypertension Sister    Aortic aneurysm Sister    Hypertension Son    Past Surgical History:  Procedure Laterality Date   BACK SURGERY     BONE MARROW ASPIRATION Left 02/2014   BONE MARROW BIOPSY Left 02/2014   CHOLECYSTECTOMY     COLONOSCOPY  12/18/2011   Procedure: COLONOSCOPY;  Surgeon: Lafayette Dragon, MD;  Location: [facility];  Service: Endoscopy;  Laterality: N/A;   COLONOSCOPY WITH PROPOFOL N/A 08/03/2017   Procedure: COLONOSCOPY WITH PROPOFOL;  Surgeon: Rogene Houston, MD;  Location: [facility];  Service: Endoscopy;  Laterality: N/A;   CYSTOSCOPY/RETROGRADE/URETEROSCOPY/STONE EXTRACTION WITH BASKET Bilateral 04/18/2018   Procedure: CYSTOSCOPY/BILATERAL RETROGRADE/URETEROSCOPY/STONE EXTRACTION WITH BASKET WITH RIGHT STENT PLACEMENT;  Surgeon: Cleon Gustin, MD;  Location: [facility];  Service: Urology;  Laterality: Bilateral;  1 HR   ESOPHAGEAL DILATION N/A 08/03/2017   Procedure: ESOPHAGEAL DILATION;  Surgeon: Rogene Houston, MD;  Location: [facility];  Service: Endoscopy;  Laterality: N/A;   ESOPHAGOGASTRODUODENOSCOPY  12/18/2011   Procedure: ESOPHAGOGASTRODUODENOSCOPY (EGD);  Surgeon: Lafayette Dragon, MD;  Location: [facility];  Service: Endoscopy;  Laterality: N/A;   ESOPHAGOGASTRODUODENOSCOPY (EGD) WITH PROPOFOL N/A 08/03/2017   Procedure: ESOPHAGOGASTRODUODENOSCOPY (EGD) WITH PROPOFOL;  Surgeon: Rogene Houston, MD;  [facility];  Service: Endoscopy;  Laterality: N/A;  pt knows to arrive at [place] Right 1/82/9937   Procedure: HOLMIUM LASER APPLICATION;  Surgeon: Cleon Gustin, MD;  Location: [facility];  Service: Urology;  Laterality: Right;   LUMBAR LAMINECTOMY FOR EPIDURAL ABSCESS Left 09/22/2013   Procedure: LUMBAR LAMINECTOMY  FOR EPIDURAL ABSCESS LEFT LUMBAR FIVE-SACRAL ONE;  Surgeon: Charlie Pitter, MD;  Location: [place] [facility];  Service: Neurosurgery;  Laterality: Left;   RADIOLOGY WITH ANESTHESIA N/A 08/13/2014   Procedure: RADIOLOGY WITH ANESTHESIA ;  Surgeon: Medication Radiologist, MD;  Location: [place] [facility];  Service: Radiology;  Laterality: N/A;   URETERAL STENT PLACEMENT     VERTEBROPLASTY N/A 07/14/2013   Procedure: VERTEBROPLASTY WITH LUMBAR THREE BIOPSY;  Surgeon: Charlie Pitter, MD;  Location: [place] [facility];  Service: Neurosurgery;  Laterality: N/A;  VERTEBROPLASTY WITH LUMBAR THREE BIOPSY   Social History   Social History Narrative   Not on file   There is no immunization history for the selected administration types on file for this patient.   Objective: Vital Signs: BP (!) 93/54 (BP Location: Right Arm, Patient Position: Sitting, Cuff Size: Normal)   Pulse 77   Resp 16   Ht _0  (1.549 m)   Wt 162 lb (73.5 kg)   BMI 30.61 kg/m    Physical Exam Cardiovascular:     Rate and Rhythm: Normal rate and regular rhythm.  Pulmonary:     Effort: Pulmonary effort is normal.     Breath sounds: Normal breath sounds.  Musculoskeletal:     Right lower leg: No edema.     Left lower leg: No edema.  Skin:    General: Skin is warm and dry.  Neurological:     Mental Status: She is alert.  Psychiatric:        Mood and Affect: Mood normal.     Musculoskeletal Exam:  Shoulders full ROM no tenderness or swelling Elbows full ROM no tenderness or swelling Wrists full ROM no tenderness or swelling Fingers full ROM, heberdon's nodes without much tenderness worst left 2nd DIP Knees full ROM no tenderness or swelling Ankles full ROM no tenderness or swelling   Investigation: No additional findings.  Imaging: No results found.  Recent Labs: Lab Results  Component Value Date   WBC 6.9 05/02/2021   HGB 13.8 05/02/2021   PLT 270 05/02/2021   NA 141 05/02/2021   K 4.3 05/02/2021   CL 106 05/02/2021    CO2 28 05/02/2021   GLUCOSE 76 05/02/2021   BUN 18 05/02/2021   CREATININE 1.11 (H) 05/02/2021   BILITOT 0.6 05/02/2021   ALKPHOS 79 06/12/2019   AST 16 05/02/2021   ALT 6 05/02/2021   PROT 6.4 05/02/2021   ALBUMIN 3.5 06/12/2019   CALCIUM 10.4 05/02/2021   GFRAA 67 08/09/2020    Speciality Comments: No specialty comments available.  Procedures:  No procedures performed Allergies:  Chlorhexidine gluconate, Morphine and related, and Tetracycline   Assessment / Plan:     Visit Diagnoses: Retroperitoneal fibrosis - Plan: Sedimentation rate  Currently doing well not sure if flank pain episode is directly related. No recurrence of low back pain flare up. Checking ESR for inflammatory disease monitoring. Plan to continue azathioprine 100 mg and prednisone 2.5 mg daily for now. If upcoming renal ultrasound later this month also improving will taper medication further.  High risk medication use - Imuran 144m by mouth daily, prednisone 2.520mpo qd.  - Plan: CBC with Differential/Platelet, COMPLETE METABOLIC PANEL WITH GFR  Checking CBC and CMP for medication monitoring on long term use of azathioprine and prednisone  Urinary frequency - Plan: Urinalysis  Increase in urinary frequency and what sounds like stress incontinence. Will check UA for any evidence of UTI or other abnormality.  Orders: Orders Placed This Encounter  Procedures   Sedimentation rate   CBC with Differential/Platelet   COMPLETE METABOLIC PANEL WITH GFR   Urinalysis   No orders of the defined types were placed in this encounter.    Follow-Up Instructions: Return in about 3 months (around 11/02/2021) for RF on AZA/GC f/u 66m12mo  ChrCollier SalinaD  Note - This record has been created using Dra[employer]ristol[employer] SquibbChart creation errors have been sought, but may not always  have been located. Such creation errors do not reflect on  the standard of medical care.

## 2021-08-02 ENCOUNTER — Encounter: Payer: Self-pay | Admitting: Internal Medicine

## 2021-08-02 ENCOUNTER — Ambulatory Visit: Payer: Medicare Other | Admitting: Internal Medicine

## 2021-08-02 VITALS — BP 93/54 | HR 77 | Resp 16 | Ht 61.0 in | Wt 162.0 lb

## 2021-08-02 DIAGNOSIS — Z79899 Other long term (current) drug therapy: Secondary | ICD-10-CM | POA: Diagnosis not present

## 2021-08-02 DIAGNOSIS — N200 Calculus of kidney: Secondary | ICD-10-CM | POA: Diagnosis not present

## 2021-08-02 DIAGNOSIS — M19041 Primary osteoarthritis, right hand: Secondary | ICD-10-CM | POA: Diagnosis not present

## 2021-08-02 DIAGNOSIS — N135 Crossing vessel and stricture of ureter without hydronephrosis: Secondary | ICD-10-CM | POA: Diagnosis not present

## 2021-08-02 DIAGNOSIS — M19042 Primary osteoarthritis, left hand: Secondary | ICD-10-CM

## 2021-08-02 DIAGNOSIS — R350 Frequency of micturition: Secondary | ICD-10-CM | POA: Insufficient documentation

## 2021-08-03 LAB — COMPLETE METABOLIC PANEL WITH GFR
AG Ratio: 1.6 (calc) (ref 1.0–2.5)
ALT: 4 U/L — ABNORMAL LOW (ref 6–29)
AST: 13 U/L (ref 10–35)
Albumin: 4.2 g/dL (ref 3.6–5.1)
Alkaline phosphatase (APISO): 51 U/L (ref 37–153)
BUN/Creatinine Ratio: 16 (calc) (ref 6–22)
BUN: 39 mg/dL — ABNORMAL HIGH (ref 7–25)
CO2: 29 mmol/L (ref 20–32)
Calcium: 10.1 mg/dL (ref 8.6–10.4)
Chloride: 104 mmol/L (ref 98–110)
Creat: 2.43 mg/dL — ABNORMAL HIGH (ref 0.60–1.00)
Globulin: 2.6 g/dL (calc) (ref 1.9–3.7)
Glucose, Bld: 85 mg/dL (ref 65–99)
Potassium: 3.8 mmol/L (ref 3.5–5.3)
Sodium: 141 mmol/L (ref 135–146)
Total Bilirubin: 0.5 mg/dL (ref 0.2–1.2)
Total Protein: 6.8 g/dL (ref 6.1–8.1)
eGFR: 21 mL/min/{1.73_m2} — ABNORMAL LOW (ref >=60)

## 2021-08-03 LAB — CBC WITH DIFFERENTIAL/PLATELET
Absolute Monocytes: 431 cells/uL (ref 200–950)
Basophils Absolute: 69 cells/uL (ref 0–200)
Basophils Relative: 0.9 %
Eosinophils Absolute: 162 cells/uL (ref 15–500)
Eosinophils Relative: 2.1 %
HCT: 41 % (ref 35.0–45.0)
Hemoglobin: 13.7 g/dL (ref 11.7–15.5)
Lymphs Abs: 1502 cells/uL (ref 850–3900)
MCH: 29.7 pg (ref 27.0–33.0)
MCHC: 33.4 g/dL (ref 32.0–36.0)
MCV: 88.7 fL (ref 80.0–100.0)
MPV: 10.1 fL (ref 7.5–12.5)
Monocytes Relative: 5.6 %
Neutro Abs: 5536 cells/uL (ref 1500–7800)
Neutrophils Relative %: 71.9 %
Platelets: 292 10*3/uL (ref 140–400)
RBC: 4.62 10*6/uL (ref 3.80–5.10)
RDW: 14.6 % (ref 11.0–15.0)
Total Lymphocyte: 19.5 %
WBC: 7.7 10*3/uL (ref 3.8–10.8)

## 2021-08-03 LAB — URINALYSIS
Bilirubin Urine: NEGATIVE
Glucose, UA: NEGATIVE
Hgb urine dipstick: NEGATIVE
Ketones, ur: NEGATIVE
Nitrite: NEGATIVE
Specific Gravity, Urine: 1.015 (ref 1.001–1.035)
pH: 5.5 (ref 5.0–8.0)

## 2021-08-03 LAB — SEDIMENTATION RATE: Sed Rate: 34 mm/h — ABNORMAL HIGH (ref 0–30)

## 2021-08-03 NOTE — Progress Notes (Signed)
Lab result showed increased sedimentation rate indicating inflammation. Her kidney function number is worse than it has been before.  She should stop taking the diovan (her blood pressure medicine) this could be worsening kidney function.  We need to recheck her labs again soon ideally Friday or Monday. Need to repeat the blood test for kidney function. I would like to get a urine culture test to clarify whether she really has an infection.

## 2021-08-03 NOTE — Addendum Note (Signed)
Addended by: Collier Salina on: 08/03/2021 10:29 AM   Modules accepted: Orders

## 2021-08-04 ENCOUNTER — Telehealth: Payer: Self-pay | Admitting: Internal Medicine

## 2021-08-04 NOTE — Telephone Encounter (Signed)
Patient called stating she was returning  Andrea's call regarding her labwork results. 

## 2021-08-04 NOTE — Telephone Encounter (Signed)
See lab note for details.  

## 2021-08-08 ENCOUNTER — Other Ambulatory Visit: Payer: Self-pay | Admitting: *Deleted

## 2021-08-08 ENCOUNTER — Ambulatory Visit: Payer: PPO | Admitting: Urology

## 2021-08-08 DIAGNOSIS — R350 Frequency of micturition: Secondary | ICD-10-CM

## 2021-08-09 LAB — URINE CULTURE
MICRO NUMBER:: 13514301
SPECIMEN QUALITY:: ADEQUATE

## 2021-08-09 LAB — BASIC METABOLIC PANEL WITH GFR
BUN/Creatinine Ratio: 17 (calc) (ref 6–22)
BUN: 32 mg/dL — ABNORMAL HIGH (ref 7–25)
CO2: 29 mmol/L (ref 20–32)
Calcium: 9.9 mg/dL (ref 8.6–10.4)
Chloride: 103 mmol/L (ref 98–110)
Creat: 1.83 mg/dL — ABNORMAL HIGH (ref 0.60–1.00)
Glucose, Bld: 98 mg/dL (ref 65–99)
Potassium: 4.1 mmol/L (ref 3.5–5.3)
Sodium: 141 mmol/L (ref 135–146)
eGFR: 29 mL/min/{1.73_m2} — ABNORMAL LOW (ref >=60)

## 2021-08-10 ENCOUNTER — Telehealth: Payer: Self-pay | Admitting: Internal Medicine

## 2021-08-10 NOTE — Telephone Encounter (Signed)
Patient called the office requesting a call back if we have received her lab results. Patient states she is afraid someone tried to reach out to her already but she missed the call.

## 2021-08-10 NOTE — Telephone Encounter (Signed)
LMOM for patient to call office to re-discuss lab results.

## 2021-08-15 ENCOUNTER — Ambulatory Visit (HOSPITAL_COMMUNITY)
Admission: RE | Admit: 2021-08-15 | Discharge: 2021-08-15 | Disposition: A | Discharge status: Home / Self Care | Payer: Medicare Other | Source: Ambulatory Visit | Attending: Urology | Admitting: Urology

## 2021-08-15 DIAGNOSIS — N200 Calculus of kidney: Secondary | ICD-10-CM | POA: Diagnosis not present

## 2021-08-16 ENCOUNTER — Ambulatory Visit (HOSPITAL_COMMUNITY)
Admission: RE | Admit: 2021-08-16 | Discharge: 2021-08-16 | Disposition: A | Discharge status: Home / Self Care | Payer: Medicare Other | Source: Ambulatory Visit | Attending: Urology | Admitting: Urology

## 2021-08-16 ENCOUNTER — Encounter: Payer: Self-pay | Admitting: Urology

## 2021-08-16 ENCOUNTER — Ambulatory Visit: Payer: Medicare Other | Admitting: Urology

## 2021-08-16 VITALS — BP 116/60 | HR 71

## 2021-08-16 DIAGNOSIS — R1011 Right upper quadrant pain: Secondary | ICD-10-CM | POA: Diagnosis not present

## 2021-08-16 DIAGNOSIS — N200 Calculus of kidney: Secondary | ICD-10-CM

## 2021-08-16 LAB — URINALYSIS, ROUTINE W REFLEX MICROSCOPIC
Bilirubin, UA: NEGATIVE
Glucose, UA: NEGATIVE
Ketones, UA: NEGATIVE
Nitrite, UA: NEGATIVE
Protein,UA: NEGATIVE
RBC, UA: NEGATIVE
Specific Gravity, UA: 1.015 (ref 1.005–1.030)
Urobilinogen, Ur: 0.2 mg/dL (ref 0.2–1.0)
pH, UA: 7 (ref 5.0–7.5)

## 2021-08-16 LAB — MICROSCOPIC EXAMINATION: RBC, Urine: NONE SEEN /hpf (ref 0–2)

## 2021-08-16 NOTE — Patient Instructions (Signed)
Dietary Guidelines to Help Prevent Kidney Stones Kidney stones are deposits of minerals and salts that form inside your kidneys. Your risk of developing kidney stones may be greater depending on your diet, your lifestyle, the medicines you take, and whether you have certain medical conditions. Most people can lower their chances of developing kidney stones by following the instructions below. Your dietitian may give you more specific instructions depending on your overall health and the type of kidney stones you tend to develop. What are tips for following this plan? Reading food labels  Choose foods with "no salt added" or "low-salt" labels. Limit your salt (sodium) intake to less than 1,500 mg a day. Choose foods with calcium for each meal and snack. Try to eat about 300 mg of calcium at each meal. Foods that contain 200-500 mg of calcium a serving include: 8 oz (237 mL) of milk, calcium-fortifiednon-dairy milk, and calcium-fortifiedfruit juice. Calcium-fortified means that calcium has been added to these drinks. 8 oz (237 mL) of kefir, yogurt, and soy yogurt. 4 oz (114 g) of tofu. 1 oz (28 g) of cheese. 1 cup (150 g) of dried figs. 1 cup (91 g) of cooked broccoli. One 3 oz (85 g) can of sardines or mackerel. Most people need 1,000-1,500 mg of calcium a day. Talk to your dietitian about how much calcium is recommended for you. Shopping Buy plenty of fresh fruits and vegetables. Most people do not need to avoid fruits and vegetables, even if these foods contain nutrients that may contribute to kidney stones. When shopping for convenience foods, choose: Whole pieces of fruit. Pre-made salads with dressing on the side. Low-fat fruit and yogurt smoothies. Avoid buying frozen meals or prepared deli foods. These can be high in sodium. Look for foods with live cultures, such as yogurt and kefir. Choose high-fiber grains, such as whole-wheat breads, oat bran, and wheat cereals. Cooking Do not add  salt to food when cooking. Place a salt shaker on the table and allow each person to add his or her own salt to taste. Use vegetable protein, such as beans, textured vegetable protein (TVP), or tofu, instead of meat in pasta, casseroles, and soups. Meal planning Eat less salt, if told by your dietitian. To do this: Avoid eating processed or pre-made food. Avoid eating fast food. Eat less animal protein, including cheese, meat, poultry, or fish, if told by your dietitian. To do this: Limit the number of times you have meat, poultry, fish, or cheese each week. Eat a diet free of meat at least 2 days a week. Eat only one serving each day of meat, poultry, fish, or seafood. When you prepare animal protein, cut pieces into small portion sizes. For most meat and fish, one serving is about the size of the palm of your hand. Eat at least five servings of fresh fruits and vegetables each day. To do this: Keep fruits and vegetables on hand for snacks. Eat one piece of fruit or a handful of berries with breakfast. Have a salad and fruit at lunch. Have two kinds of vegetables at dinner. Limit foods that are high in a substance called oxalate. These include: Spinach (cooked), rhubarb, beets, sweet potatoes, and Swiss chard. Peanuts. Potato chips, french fries, and baked potatoes with skin on. Nuts and nut products. Chocolate. If you regularly take a diuretic medicine, make sure to eat at least 1 or 2 servings of fruits or vegetables that are high in potassium each day. These include: Avocado. Banana. Orange, prune,   carrot, or tomato juice. Baked potato. Cabbage. Beans and split peas. Lifestyle  Drink enough fluid to keep your urine pale yellow. This is the most important thing you can do. Spread your fluid intake throughout the day. If you drink alcohol: Limit how much you use to: 0-1 drink a day for women who are not pregnant. 0-2 drinks a day for men. Be aware of how much alcohol is in your  drink. In the [place] one drink equals one 12 oz bottle of beer (355 mL), one 5 oz glass of wine (148 mL), or one 1 oz glass of hard liquor (44 mL). Lose weight if told by your health care provider. Work with your dietitian to find an eating plan and weight loss strategies that work best for you. General information Talk to your health care provider and dietitian about taking daily supplements. You may be told the following depending on your health and the cause of your kidney stones: Not to take supplements with vitamin C. To take a calcium supplement. To take a daily probiotic supplement. To take other supplements such as magnesium, fish oil, or vitamin B6. Take over-the-counter and prescription medicines only as told by your health care provider. These include supplements. What foods should I limit? Limit your intake of the following foods, or eat them as told by your dietitian. Vegetables Spinach. Rhubarb. Beets. Canned vegetables. Pickles. Olives. Baked potatoes with skin. Grains Wheat bran. Baked goods. Salted crackers. Cereals high in sugar. Meats and other proteins Nuts. Nut butters. Large portions of meat, poultry, or fish. Salted, precooked, or cured meats, such as sausages, meat loaves, and hot dogs. Dairy Cheese. Beverages Regular soft drinks. Regular vegetable juice. Seasonings and condiments Seasoning blends with salt. Salad dressings. Soy sauce. Ketchup. Barbecue sauce. Other foods Canned soups. Canned pasta sauce. Casseroles. Pizza. Lasagna. Frozen meals. Potato chips. French fries. The items listed above may not be a complete list of foods and beverages you should limit. Contact a dietitian for more information. What foods should I avoid? Talk to your dietitian about specific foods you should avoid based on the type of kidney stones you have and your overall health. Fruits Grapefruit. The item listed above may not be a complete list of foods and beverages you should  avoid. Contact a dietitian for more information. Summary Kidney stones are deposits of minerals and salts that form inside your kidneys. You can lower your risk of kidney stones by making changes to your diet. The most important thing you can do is drink enough fluid. Drink enough fluid to keep your urine pale yellow. Talk to your dietitian about how much calcium you should have each day, and eat less salt and animal protein as told by your dietitian. This information is not intended to replace advice given to you by your health care provider. Make sure you discuss any questions you have with your health care provider. Document Revised: 10/25/2020 Document Reviewed: 10/25/2020 Elsevier Patient Education  2023 [employer]  

## 2021-08-16 NOTE — H&P (View-Only) (Signed)
08/16/2021 11:11 AM   Autumn Duran 1950/10/06 [id number]  Referring provider: [name], MD [place],  [place] [address]  Right flank pain   HPI: Autumn Duran is a 71yo here for followup for retroperitoneal fibrosis and nephrolithiasis. Over the past month she had worsening right flank pain and creatinine increased to 2.4 from baseline of 1.0. She is more fatigued. Renal US from yesterday shows new severe right hydronephrosis.She has associated nausea but no vomiting.    PMH: Past Medical History:  Diagnosis Date   Bronchitis, chronic (HCC)    Chronic back pain    Chronic kidney disease    kidney stone s/p stent placement ( removed)   Complication of anesthesia    Compression fracture of L3 lumbar vertebra    Diverticulosis    GERD (gastroesophageal reflux disease)    Hemorrhoids    Hiatal hernia    History of kidney stones    Hx of adenomatous colonic polyps    Hyperlipidemia    Hypertension    Lumbar radicular pain    Osteoporosis 12/10/2017   PONV (postoperative nausea and vomiting)    Smoldering myeloma    Sore throat    T7 vertebral fracture [facility])     Surgical History: Past Surgical History:  Procedure Laterality Date   BACK SURGERY     BONE MARROW ASPIRATION Left 02/2014   BONE MARROW BIOPSY Left 02/2014   CHOLECYSTECTOMY     COLONOSCOPY  12/18/2011   Procedure: COLONOSCOPY;  Surgeon: Lafayette Dragon, MD;  Location: [facility];  Service: Endoscopy;  Laterality: N/A;   COLONOSCOPY WITH PROPOFOL N/A 08/03/2017   Procedure: COLONOSCOPY WITH PROPOFOL;  Surgeon: Rogene Houston, MD;  [facility]: [facility];  Service: Endoscopy;  Laterality: N/A;   CYSTOSCOPY/RETROGRADE/URETEROSCOPY/STONE EXTRACTION WITH BASKET Bilateral 04/18/2018   Procedure: CYSTOSCOPY/BILATERAL RETROGRADE/URETEROSCOPY/STONE EXTRACTION WITH BASKET WITH RIGHT STENT PLACEMENT;  Surgeon: Cleon Gustin, MD;  Location: [facility];  Service: Urology;  Laterality: Bilateral;  1 HR    ESOPHAGEAL DILATION N/A 08/03/2017   Procedure: ESOPHAGEAL DILATION;  Surgeon: Rogene Houston, MD;  [facility];  Service: Endoscopy;  Laterality: N/A;   ESOPHAGOGASTRODUODENOSCOPY  12/18/2011   Procedure: ESOPHAGOGASTRODUODENOSCOPY (EGD);  Surgeon: Lafayette Dragon, MD;  Location: [facility];  Service: Endoscopy;  Laterality: N/A;   ESOPHAGOGASTRODUODENOSCOPY (EGD) WITH PROPOFOL N/A 08/03/2017   Procedure: ESOPHAGOGASTRODUODENOSCOPY (EGD) WITH PROPOFOL;  Surgeon: Rogene Houston, MD;  [facility];  Service: Endoscopy;  Laterality: N/A;  pt knows to arrive at [place] Right 09/10/9537   Procedure: HOLMIUM LASER APPLICATION;  Surgeon: Cleon Gustin, MD;  Location: [facility];  Service: Urology;  Laterality: Right;   LUMBAR LAMINECTOMY FOR EPIDURAL ABSCESS Left 09/22/2013   Procedure: LUMBAR LAMINECTOMY FOR EPIDURAL ABSCESS LEFT LUMBAR FIVE-SACRAL ONE;  Surgeon: Charlie Pitter, MD;  Location: [place] [facility];  Service: Neurosurgery;  Laterality: Left;   RADIOLOGY WITH ANESTHESIA N/A 08/13/2014   Procedure: RADIOLOGY WITH ANESTHESIA ;  Surgeon: Medication Radiologist, MD;  Location: [place] [facility];  Service: Radiology;  Laterality: N/A;   URETERAL STENT PLACEMENT     VERTEBROPLASTY N/A 07/14/2013   Procedure: VERTEBROPLASTY WITH LUMBAR THREE BIOPSY;  Surgeon: Charlie Pitter, MD;  Location: [place] [facility];  Service: Neurosurgery;  Laterality: N/A;  VERTEBROPLASTY WITH LUMBAR THREE BIOPSY    Home Medications:  Allergies as of 08/16/2021  Reactions   Chlorhexidine Gluconate Itching   Pt c/o itching after abd wipes.    Morphine And Related Nausea And Vomiting   Headache   Tetracycline Nausea Only        Medication List        Accurate as of August 16, 2021 11:11 AM. If you have any questions, ask your nurse or doctor.          azaTHIOprine 50 MG tablet Commonly known as: IMURAN Take 2 tablets (100 mg total) by mouth daily.    Azelastine HCl 0.15 % Soln Place 1 spray into both nostrils daily.   ergocalciferol 1.25 MG (50000 UT) capsule Commonly known as: VITAMIN D2 Take 1 capsule (50,000 Units total) by mouth once a week.   furosemide 20 MG tablet Commonly known as: LASIX Take 20 mg by mouth daily as needed.   HYDROcodone-acetaminophen 5-325 MG tablet Commonly known as: NORCO/VICODIN as needed.   hydrocortisone 1 % ointment Apply 1 application topically 2 (two) times daily.   ibuprofen 200 MG tablet Commonly known as: ADVIL Take 400 mg by mouth daily as needed for mild pain.   omeprazole 20 MG capsule Commonly known as: PRILOSEC Take 20 mg by mouth 2 (two) times daily before a meal.   oxyCODONE 20 mg 12 hr tablet Commonly known as: OXYCONTIN Take 20 mg by mouth daily.   polyethylene glycol powder 17 GM/SCOOP powder Commonly known as: GLYCOLAX/MIRALAX Take by mouth.   potassium chloride 10 MEQ tablet Commonly known as: KLOR-CON Take 10 mEq by mouth daily as needed.   predniSONE 2.5 MG tablet Commonly known as: DELTASONE Take 1 tablet (2.5 mg total) by mouth daily with breakfast.   THERATEARS OP Place 1 drop into both eyes daily as needed (dry eyes).   valsartan-hydrochlorothiazide 80-12.5 MG tablet Commonly known as: DIOVAN-HCT Take 1 tablet by mouth daily.        Allergies:  Allergies  Allergen Reactions   Chlorhexidine Gluconate Itching    Pt c/o itching after abd wipes.    Morphine And Related Nausea And Vomiting    Headache   Tetracycline Nausea Only    Family History: Family History  Problem Relation Age of Onset   Breast cancer Sister 76   Breast cancer Other 50       aunt   Hypertension Mother    COPD Mother    COPD Father    Hypertension Sister    Aortic aneurysm Sister    Hypertension Son     Social History:  reports that she has never smoked. She has never used smokeless tobacco. She reports current alcohol use. She reports that she does not use  drugs.  ROS: All other review of systems were reviewed and are negative except what is noted above in HPI  Physical Exam: BP 116/60   Pulse 71   Constitutional:  Alert and oriented, No acute distress. HEENT: [place] AT, moist mucus membranes.  Trachea midline, no masses. Cardiovascular: No clubbing, cyanosis, or edema. Respiratory: Normal respiratory effort, no increased work of breathing. GI: Abdomen is soft, nontender, nondistended, no abdominal masses GU: No CVA tenderness.  Lymph: No cervical or inguinal lymphadenopathy. Skin: No rashes, bruises or suspicious lesions. Neurologic: Grossly intact, no focal deficits, moving all 4 extremities. Psychiatric: Normal mood and affect.  Laboratory Data: Lab Results  Component Value Date   WBC 7.7 08/02/2021   HGB 13.7 08/02/2021   HCT 41.0 08/02/2021   MCV 88.7 08/02/2021   PLT 292  08/02/2021    Lab Results  Component Value Date   CREATININE 1.83 (H) 08/08/2021    No results found for: "PSA"  No results found for: "TESTOSTERONE"  No results found for: "HGBA1C"  Urinalysis    Component Value Date/Time   COLORURINE YELLOW 08/02/2021 1411   APPEARANCEUR CLOUDY (A) 08/02/2021 1411   APPEARANCEUR Clear 02/07/2021 1207   LABSPEC 1.015 08/02/2021 1411   PHURINE 5.5 08/02/2021 1411   GLUCOSEU NEGATIVE 08/02/2021 1411   HGBUR NEGATIVE 08/02/2021 1411   BILIRUBINUR Negative 02/07/2021 1207   KETONESUR NEGATIVE 08/02/2021 1411   PROTEINUR TRACE (A) 08/02/2021 1411   UROBILINOGEN 0.2 08/13/2014 0850   NITRITE NEGATIVE 08/02/2021 1411   LEUKOCYTESUR 3+ (A) 08/02/2021 1411    Lab Results  Component Value Date   LABMICR See below: 02/07/2021   WBCUA >30 (A) 02/07/2021   LABEPIT >10 (A) 02/07/2021   MUCUS Present 02/07/2021   BACTERIA Moderate (A) 02/07/2021    Pertinent Imaging: Renal US yesterday: Images reviewed and discussed with the patient  No results found for this or any previous visit.  Results for orders placed  during the hospital encounter of 12/18/13  US Venous Img Lower Bilateral  Narrative CLINICAL DATA:  Leg swelling  EXAM: BILATERAL LOWER EXTREMITY VENOUS DOPPLER ULTRASOUND  TECHNIQUE: Gray-scale sonography with graded compression, as well as color Doppler and duplex ultrasound were performed to evaluate the lower extremity deep venous systems from the level of the common femoral vein and including the common femoral, femoral, profunda femoral, popliteal and calf veins including the posterior tibial, peroneal and gastrocnemius veins when visible. The superficial great saphenous vein was also interrogated. Spectral Doppler was utilized to evaluate flow at rest and with distal augmentation maneuvers in the common femoral, femoral and popliteal veins.  COMPARISON:  None.  FINDINGS: RIGHT LOWER EXTREMITY  Common Femoral Vein: No evidence of thrombus. Normal compressibility, respiratory phasicity and response to augmentation.  Saphenofemoral Junction: No evidence of thrombus. Normal compressibility and flow on color Doppler imaging.  Profunda Femoral Vein: No evidence of thrombus. Normal compressibility and flow on color Doppler imaging.  Femoral Vein: No evidence of thrombus. Normal compressibility, respiratory phasicity and response to augmentation.  Popliteal Vein: No evidence of thrombus. Normal compressibility, respiratory phasicity and response to augmentation.  Calf Veins: No evidence of thrombus. Normal compressibility and flow on color Doppler imaging.  Superficial Great Saphenous Vein: No evidence of thrombus. Normal compressibility and flow on color Doppler imaging.  Venous Reflux:  None.  Other Findings:  None.  LEFT LOWER EXTREMITY  Common Femoral Vein: No evidence of thrombus. Normal compressibility, respiratory phasicity and response to augmentation.  Saphenofemoral Junction: No evidence of thrombus. Normal compressibility and flow on color Doppler  imaging.  Profunda Femoral Vein: No evidence of thrombus. Normal compressibility and flow on color Doppler imaging.  Femoral Vein: No evidence of thrombus. Normal compressibility, respiratory phasicity and response to augmentation.  Popliteal Vein: No evidence of thrombus. Normal compressibility, respiratory phasicity and response to augmentation.  Calf Veins: Not well visualized.  Superficial Great Saphenous Vein: No evidence of thrombus. Normal compressibility and flow on color Doppler imaging.  Venous Reflux:  None.  Other Findings:  None.  IMPRESSION: No evidence of deep venous thrombosis.   Electronically Signed By: Kathreen Devoid On: 12/18/2013 13:37  No results found for this or any previous visit.  No results found for this or any previous visit.  Results for orders placed during the hospital encounter of 08/15/21  Ultrasound renal complete  Narrative CLINICAL DATA:  nephrolithiasis  EXAM: RENAL / URINARY TRACT ULTRASOUND COMPLETE  COMPARISON:  Ultrasound dated February 03, 2021  FINDINGS: Right Kidney:  Renal measurements: 12.9 x 6.5 x 6.9 cm = volume: 304 mL. Echogenicity within normal limits. Severe hydronephrosis, increased in comparison to prior. Echogenic focus in the interpolar kidney with twinkle artifact most consistent with nonobstructing nephrolithiasis.  Left Kidney:  Renal measurements: 10.5 x 5.4 x 4.9 cm = volume: 146 mL. Echogenicity within normal limits. Severe hydronephrosis; this is similar comparison to prior. There is diffuse cortical thinning. Multiple echogenic foci in the superior and interpolar kidney are most consistent with nonobstructing nephrolithiasis.  Bladder:  Decompressed, limiting evaluation.  Other:  None.  IMPRESSION: 1. New severe RIGHT hydronephrosis. Recommend further dedicated evaluation with cross-sectional imaging to evaluate for etiology. 2. Similar appearance of severe LEFT-sided  hydronephrosis.  These results will be called to the ordering clinician or representative by the Radiologist Assistant, and communication documented in the PACS or [employer].   Electronically Signed By: Valentino Saxon M.D. On: 08/15/2021 17:16  No results found for this or any previous visit.  No results found for this or any previous visit.  Results for orders placed during the hospital encounter of 02/26/19  CT RENAL STONE STUDY  Narrative CLINICAL DATA:  Right lower quadrant and right flank pain. History of nephrolithiasis. Prior cholecystectomy.  EXAM: CT ABDOMEN AND PELVIS WITHOUT CONTRAST  TECHNIQUE: Multidetector CT imaging of the abdomen and pelvis was performed following the standard protocol without IV contrast.  COMPARISON:  02/24/2019 renal sonogram. 04/17/2018 unenhanced CT abdomen/pelvis.  FINDINGS: Lower chest: Mild right middle lobe and lingular and moderate left lower lobe cylindrical bronchiectasis with associated thick bandlike scarring, similar to slightly worsened in the interval. Right lower lobe 3 mm solid pulmonary nodule (series 4/image 3), stable, considered benign.  Hepatobiliary: Normal liver size. Simple 2.5 cm posterior left liver lobe cyst. No additional liver lesions. Punctate granulomatous right liver dome calcification is unchanged. Cholecystectomy. Stable dilated CBD, 8 mm diameter, within normal post cholecystectomy limits, without significant intrahepatic biliary ductal dilatation.  Pancreas: Normal, with no mass or duct dilation.  Spleen: Normal size. No mass.  Adrenals/Urinary Tract: Normal adrenals. Nonobstructing 2 mm upper, 3 mm interpolar and 3 mm lower right renal stones. Nonobstructing 1 mm upper left renal stones. Subcentimeter hypodense renal cortical lesion in the medial upper right kidney is too small to characterize and is unchanged, considered benign. No contour deforming renal masses. Mild right  hydronephrosis with moderately dilated right extrarenal pelvis with somewhat abrupt caliber transition at the right ureteropelvic junction, increased since 08/10/2018 CT. Moderate left hydronephrosis with abrupt caliber transition at the left ureteropelvic junction, mildly increased since 08/10/2018 CT. Normal caliber ureters. No UPJ or ureteral stones. Normal bladder, with no bladder stones.  Stomach/Bowel: Curvilinear hyperdensity adjacent to the esophagogastric junction. Stomach is nondistended with no acute gastric abnormality. Normal caliber small bowel with no small bowel wall thickening. Normal appendix. Normal large bowel with no diverticulosis, large bowel wall thickening or pericolonic fat stranding.  Vascular/Lymphatic: Atherosclerotic nonaneurysmal abdominal aorta. There is worsening fat stranding throughout the retroperitoneum surrounding the IVC and abdominal aorta and proximal ureters bilaterally. No pathologically enlarged lymph nodes in the abdomen or pelvis.  Reproductive: Grossly normal uterus. No adnexal mass. Bilateral tubal ligation clips in place.  Other: No pneumoperitoneum, ascites or focal fluid collection.  Musculoskeletal: No aggressive appearing focal osseous lesions. Severe degenerative disc disease at L3-4.  Vertebroplasty material again noted within mild-to-moderate L3 vertebral compression fracture. Prominent bilateral lower lumbar facet arthropathy with stable grade 1 anterolisthesis at L5-S1.  IMPRESSION: 1. 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 2. Mild right and moderate left hydronephrosis with abrupt caliber transitions at the UPJ bilaterally, without evidence of obstructing stone or mass on this noncontrast scan. Findings could represent progressive proximal ureteral obstruction due to  retroperitoneal fibrosis. 3. Tiny nonobstructing stones in both kidneys. 4. Mild to moderate bronchiectasis and thick bandlike scarring at both lung bases, similar to slightly worsened. 5.  Aortic Atherosclerosis (ICD10-I70.0).   Electronically Signed By: Ilona Sorrel M.D. On: 02/26/2019 15:50   Assessment & Plan:    1. Nephrolithiasis STAT CT stone study - Urinalysis, Routine w reflex microscopic   No follow-ups on file.  [name], MD  [facility] [place]

## 2021-08-16 NOTE — Progress Notes (Signed)
08/16/2021 11:11 AM   Althea Charon 1950/10/06 [id number]  Referring provider: [name], MD [place],  [place] [address]  Right flank pain   HPI: Ms Hoffmaster is a 71yo here for followup for retroperitoneal fibrosis and nephrolithiasis. Over the past month she had worsening right flank pain and creatinine increased to 2.4 from baseline of 1.0. She is more fatigued. Renal US from yesterday shows new severe right hydronephrosis.She has associated nausea but no vomiting.    PMH: Past Medical History:  Diagnosis Date   Bronchitis, chronic (HCC)    Chronic back pain    Chronic kidney disease    kidney stone s/p stent placement ( removed)   Complication of anesthesia    Compression fracture of L3 lumbar vertebra    Diverticulosis    GERD (gastroesophageal reflux disease)    Hemorrhoids    Hiatal hernia    History of kidney stones    Hx of adenomatous colonic polyps    Hyperlipidemia    Hypertension    Lumbar radicular pain    Osteoporosis 12/10/2017   PONV (postoperative nausea and vomiting)    Smoldering myeloma    Sore throat    T7 vertebral fracture [facility])     Surgical History: Past Surgical History:  Procedure Laterality Date   BACK SURGERY     BONE MARROW ASPIRATION Left 02/2014   BONE MARROW BIOPSY Left 02/2014   CHOLECYSTECTOMY     COLONOSCOPY  12/18/2011   Procedure: COLONOSCOPY;  Surgeon: Lafayette Dragon, MD;  Location: [facility];  Service: Endoscopy;  Laterality: N/A;   COLONOSCOPY WITH PROPOFOL N/A 08/03/2017   Procedure: COLONOSCOPY WITH PROPOFOL;  Surgeon: Rogene Houston, MD;  [facility]: [facility];  Service: Endoscopy;  Laterality: N/A;   CYSTOSCOPY/RETROGRADE/URETEROSCOPY/STONE EXTRACTION WITH BASKET Bilateral 04/18/2018   Procedure: CYSTOSCOPY/BILATERAL RETROGRADE/URETEROSCOPY/STONE EXTRACTION WITH BASKET WITH RIGHT STENT PLACEMENT;  Surgeon: Cleon Gustin, MD;  Location: [facility];  Service: Urology;  Laterality: Bilateral;  1 HR    ESOPHAGEAL DILATION N/A 08/03/2017   Procedure: ESOPHAGEAL DILATION;  Surgeon: Rogene Houston, MD;  [facility];  Service: Endoscopy;  Laterality: N/A;   ESOPHAGOGASTRODUODENOSCOPY  12/18/2011   Procedure: ESOPHAGOGASTRODUODENOSCOPY (EGD);  Surgeon: Lafayette Dragon, MD;  Location: [facility];  Service: Endoscopy;  Laterality: N/A;   ESOPHAGOGASTRODUODENOSCOPY (EGD) WITH PROPOFOL N/A 08/03/2017   Procedure: ESOPHAGOGASTRODUODENOSCOPY (EGD) WITH PROPOFOL;  Surgeon: Rogene Houston, MD;  [facility];  Service: Endoscopy;  Laterality: N/A;  pt knows to arrive at [place] Right 09/10/9537   Procedure: HOLMIUM LASER APPLICATION;  Surgeon: Cleon Gustin, MD;  Location: [facility];  Service: Urology;  Laterality: Right;   LUMBAR LAMINECTOMY FOR EPIDURAL ABSCESS Left 09/22/2013   Procedure: LUMBAR LAMINECTOMY FOR EPIDURAL ABSCESS LEFT LUMBAR FIVE-SACRAL ONE;  Surgeon: Charlie Pitter, MD;  Location: [place] [facility];  Service: Neurosurgery;  Laterality: Left;   RADIOLOGY WITH ANESTHESIA N/A 08/13/2014   Procedure: RADIOLOGY WITH ANESTHESIA ;  Surgeon: Medication Radiologist, MD;  Location: [place] [facility];  Service: Radiology;  Laterality: N/A;   URETERAL STENT PLACEMENT     VERTEBROPLASTY N/A 07/14/2013   Procedure: VERTEBROPLASTY WITH LUMBAR THREE BIOPSY;  Surgeon: Charlie Pitter, MD;  Location: [place] [facility] OR[facility];  Service: Neurosurgery;  Laterality: N/A;  VERTEBROPLASTY WITH LUMBAR THREE BIOPSY    Home Medications:  Allergies as of 08/16/2021  Reactions   Chlorhexidine Gluconate Itching   Pt c/o itching after abd wipes.    Morphine And Related Nausea And Vomiting   Headache   Tetracycline Nausea Only        Medication List        Accurate as of August 16, 2021 11:11 AM. If you have any questions, ask your nurse or doctor.          azaTHIOprine 50 MG tablet Commonly known as: IMURAN Take 2 tablets (100 mg total) by mouth daily.    Azelastine HCl 0.15 % Soln Place 1 spray into both nostrils daily.   ergocalciferol 1.25 MG (50000 UT) capsule Commonly known as: VITAMIN D2 Take 1 capsule (50,000 Units total) by mouth once a week.   furosemide 20 MG tablet Commonly known as: LASIX Take 20 mg by mouth daily as needed.   HYDROcodone-acetaminophen 5-325 MG tablet Commonly known as: NORCO/VICODIN as needed.   hydrocortisone 1 % ointment Apply 1 application topically 2 (two) times daily.   ibuprofen 200 MG tablet Commonly known as: ADVIL Take 400 mg by mouth daily as needed for mild pain.   omeprazole 20 MG capsule Commonly known as: PRILOSEC Take 20 mg by mouth 2 (two) times daily before a meal.   oxyCODONE 20 mg 12 hr tablet Commonly known as: OXYCONTIN Take 20 mg by mouth daily.   polyethylene glycol powder 17 GM/SCOOP powder Commonly known as: GLYCOLAX/MIRALAX Take by mouth.   potassium chloride 10 MEQ tablet Commonly known as: KLOR-CON Take 10 mEq by mouth daily as needed.   predniSONE 2.5 MG tablet Commonly known as: DELTASONE Take 1 tablet (2.5 mg total) by mouth daily with breakfast.   THERATEARS OP Place 1 drop into both eyes daily as needed (dry eyes).   valsartan-hydrochlorothiazide 80-12.5 MG tablet Commonly known as: DIOVAN-HCT Take 1 tablet by mouth daily.        Allergies:  Allergies  Allergen Reactions   Chlorhexidine Gluconate Itching    Pt c/o itching after abd wipes.    Morphine And Related Nausea And Vomiting    Headache   Tetracycline Nausea Only    Family History: Family History  Problem Relation Age of Onset   Breast cancer Sister 67   Breast cancer Other 19       aunt   Hypertension Mother    COPD Mother    COPD Father    Hypertension Sister    Aortic aneurysm Sister    Hypertension Son     Social History:  reports that she has never smoked. She has never used smokeless tobacco. She reports current alcohol use. She reports that she does not use  drugs.  ROS: All other review of systems were reviewed and are negative except what is noted above in HPI  Physical Exam: BP 116/60   Pulse 71   Constitutional:  Alert and oriented, No acute distress. HEENT: [place] AT, moist mucus membranes.  Trachea midline, no masses. Cardiovascular: No clubbing, cyanosis, or edema. Respiratory: Normal respiratory effort, no increased work of breathing. GI: Abdomen is soft, nontender, nondistended, no abdominal masses GU: No CVA tenderness.  Lymph: No cervical or inguinal lymphadenopathy. Skin: No rashes, bruises or suspicious lesions. Neurologic: Grossly intact, no focal deficits, moving all 4 extremities. Psychiatric: Normal mood and affect.  Laboratory Data: Lab Results  Component Value Date   WBC 7.7 08/02/2021   HGB 13.7 08/02/2021   HCT 41.0 08/02/2021   MCV 88.7 08/02/2021   PLT 292  08/02/2021    Lab Results  Component Value Date   CREATININE 1.83 (H) 08/08/2021    No results found for: "PSA"  No results found for: "TESTOSTERONE"  No results found for: "HGBA1C"  Urinalysis    Component Value Date/Time   COLORURINE YELLOW 08/02/2021 1411   APPEARANCEUR CLOUDY (A) 08/02/2021 1411   APPEARANCEUR Clear 02/07/2021 1207   LABSPEC 1.015 08/02/2021 1411   PHURINE 5.5 08/02/2021 1411   GLUCOSEU NEGATIVE 08/02/2021 1411   HGBUR NEGATIVE 08/02/2021 1411   BILIRUBINUR Negative 02/07/2021 1207   KETONESUR NEGATIVE 08/02/2021 1411   PROTEINUR TRACE (A) 08/02/2021 1411   UROBILINOGEN 0.2 08/13/2014 0850   NITRITE NEGATIVE 08/02/2021 1411   LEUKOCYTESUR 3+ (A) 08/02/2021 1411    Lab Results  Component Value Date   LABMICR See below: 02/07/2021   WBCUA >30 (A) 02/07/2021   LABEPIT >10 (A) 02/07/2021   MUCUS Present 02/07/2021   BACTERIA Moderate (A) 02/07/2021    Pertinent Imaging: Renal US yesterday: Images reviewed and discussed with the patient  No results found for this or any previous visit.  Results for orders placed  during the hospital encounter of 12/18/13  US Venous Img Lower Bilateral  Narrative CLINICAL DATA:  Leg swelling  EXAM: BILATERAL LOWER EXTREMITY VENOUS DOPPLER ULTRASOUND  TECHNIQUE: Gray-scale sonography with graded compression, as well as color Doppler and duplex ultrasound were performed to evaluate the lower extremity deep venous systems from the level of the common femoral vein and including the common femoral, femoral, profunda femoral, popliteal and calf veins including the posterior tibial, peroneal and gastrocnemius veins when visible. The superficial great saphenous vein was also interrogated. Spectral Doppler was utilized to evaluate flow at rest and with distal augmentation maneuvers in the common femoral, femoral and popliteal veins.  COMPARISON:  None.  FINDINGS: RIGHT LOWER EXTREMITY  Common Femoral Vein: No evidence of thrombus. Normal compressibility, respiratory phasicity and response to augmentation.  Saphenofemoral Junction: No evidence of thrombus. Normal compressibility and flow on color Doppler imaging.  Profunda Femoral Vein: No evidence of thrombus. Normal compressibility and flow on color Doppler imaging.  Femoral Vein: No evidence of thrombus. Normal compressibility, respiratory phasicity and response to augmentation.  Popliteal Vein: No evidence of thrombus. Normal compressibility, respiratory phasicity and response to augmentation.  Calf Veins: No evidence of thrombus. Normal compressibility and flow on color Doppler imaging.  Superficial Great Saphenous Vein: No evidence of thrombus. Normal compressibility and flow on color Doppler imaging.  Venous Reflux:  None.  Other Findings:  None.  LEFT LOWER EXTREMITY  Common Femoral Vein: No evidence of thrombus. Normal compressibility, respiratory phasicity and response to augmentation.  Saphenofemoral Junction: No evidence of thrombus. Normal compressibility and flow on color Doppler  imaging.  Profunda Femoral Vein: No evidence of thrombus. Normal compressibility and flow on color Doppler imaging.  Femoral Vein: No evidence of thrombus. Normal compressibility, respiratory phasicity and response to augmentation.  Popliteal Vein: No evidence of thrombus. Normal compressibility, respiratory phasicity and response to augmentation.  Calf Veins: Not well visualized.  Superficial Great Saphenous Vein: No evidence of thrombus. Normal compressibility and flow on color Doppler imaging.  Venous Reflux:  None.  Other Findings:  None.  IMPRESSION: No evidence of deep venous thrombosis.   Electronically Signed By: Kathreen Devoid On: 12/18/2013 13:37  No results found for this or any previous visit.  No results found for this or any previous visit.  Results for orders placed during the hospital encounter of 08/15/21  Ultrasound renal complete  Narrative CLINICAL DATA:  nephrolithiasis  EXAM: RENAL / URINARY TRACT ULTRASOUND COMPLETE  COMPARISON:  Ultrasound dated February 03, 2021  FINDINGS: Right Kidney:  Renal measurements: 12.9 x 6.5 x 6.9 cm = volume: 304 mL. Echogenicity within normal limits. Severe hydronephrosis, increased in comparison to prior. Echogenic focus in the interpolar kidney with twinkle artifact most consistent with nonobstructing nephrolithiasis.  Left Kidney:  Renal measurements: 10.5 x 5.4 x 4.9 cm = volume: 146 mL. Echogenicity within normal limits. Severe hydronephrosis; this is similar comparison to prior. There is diffuse cortical thinning. Multiple echogenic foci in the superior and interpolar kidney are most consistent with nonobstructing nephrolithiasis.  Bladder:  Decompressed, limiting evaluation.  Other:  None.  IMPRESSION: 1. New severe RIGHT hydronephrosis. Recommend further dedicated evaluation with cross-sectional imaging to evaluate for etiology. 2. Similar appearance of severe LEFT-sided  hydronephrosis.  These results will be called to the ordering clinician or representative by the Radiologist Assistant, and communication documented in the PACS or [employer].   Electronically Signed By: Valentino Saxon M.D. On: 08/15/2021 17:16  No results found for this or any previous visit.  No results found for this or any previous visit.  Results for orders placed during the hospital encounter of 02/26/19  CT RENAL STONE STUDY  Narrative CLINICAL DATA:  Right lower quadrant and right flank pain. History of nephrolithiasis. Prior cholecystectomy.  EXAM: CT ABDOMEN AND PELVIS WITHOUT CONTRAST  TECHNIQUE: Multidetector CT imaging of the abdomen and pelvis was performed following the standard protocol without IV contrast.  COMPARISON:  02/24/2019 renal sonogram. 04/17/2018 unenhanced CT abdomen/pelvis.  FINDINGS: Lower chest: Mild right middle lobe and lingular and moderate left lower lobe cylindrical bronchiectasis with associated thick bandlike scarring, similar to slightly worsened in the interval. Right lower lobe 3 mm solid pulmonary nodule (series 4/image 3), stable, considered benign.  Hepatobiliary: Normal liver size. Simple 2.5 cm posterior left liver lobe cyst. No additional liver lesions. Punctate granulomatous right liver dome calcification is unchanged. Cholecystectomy. Stable dilated CBD, 8 mm diameter, within normal post cholecystectomy limits, without significant intrahepatic biliary ductal dilatation.  Pancreas: Normal, with no mass or duct dilation.  Spleen: Normal size. No mass.  Adrenals/Urinary Tract: Normal adrenals. Nonobstructing 2 mm upper, 3 mm interpolar and 3 mm lower right renal stones. Nonobstructing 1 mm upper left renal stones. Subcentimeter hypodense renal cortical lesion in the medial upper right kidney is too small to characterize and is unchanged, considered benign. No contour deforming renal masses. Mild right  hydronephrosis with moderately dilated right extrarenal pelvis with somewhat abrupt caliber transition at the right ureteropelvic junction, increased since 08/10/2018 CT. Moderate left hydronephrosis with abrupt caliber transition at the left ureteropelvic junction, mildly increased since 08/10/2018 CT. Normal caliber ureters. No UPJ or ureteral stones. Normal bladder, with no bladder stones.  Stomach/Bowel: Curvilinear hyperdensity adjacent to the esophagogastric junction. Stomach is nondistended with no acute gastric abnormality. Normal caliber small bowel with no small bowel wall thickening. Normal appendix. Normal large bowel with no diverticulosis, large bowel wall thickening or pericolonic fat stranding.  Vascular/Lymphatic: Atherosclerotic nonaneurysmal abdominal aorta. There is worsening fat stranding throughout the retroperitoneum surrounding the IVC and abdominal aorta and proximal ureters bilaterally. No pathologically enlarged lymph nodes in the abdomen or pelvis.  Reproductive: Grossly normal uterus. No adnexal mass. Bilateral tubal ligation clips in place.  Other: No pneumoperitoneum, ascites or focal fluid collection.  Musculoskeletal: No aggressive appearing focal osseous lesions. Severe degenerative disc disease at L3-4.  Vertebroplasty material again noted within mild-to-moderate L3 vertebral compression fracture. Prominent bilateral lower lumbar facet arthropathy with stable grade 1 anterolisthesis at L5-S1.  IMPRESSION: 1. 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 2. Mild right and moderate left hydronephrosis with abrupt caliber transitions at the UPJ bilaterally, without evidence of obstructing stone or mass on this noncontrast scan. Findings could represent progressive proximal ureteral obstruction due to  retroperitoneal fibrosis. 3. Tiny nonobstructing stones in both kidneys. 4. Mild to moderate bronchiectasis and thick bandlike scarring at both lung bases, similar to slightly worsened. 5.  Aortic Atherosclerosis (ICD10-I70.0).   Electronically Signed By: Ilona Sorrel M.D. On: 02/26/2019 15:50   Assessment & Plan:    1. Nephrolithiasis STAT CT stone study - Urinalysis, Routine w reflex microscopic   No follow-ups on file.  [name], MD  [facility] [place]

## 2021-08-17 ENCOUNTER — Telehealth: Payer: Self-pay

## 2021-08-17 ENCOUNTER — Inpatient Hospital Stay (HOSPITAL_COMMUNITY)
Admission: RE | Admit: 2021-08-17 | Discharge: 2021-08-17 | Disposition: A | Discharge status: Home / Self Care | Payer: Medicare Other | Source: Ambulatory Visit

## 2021-08-17 NOTE — Telephone Encounter (Signed)
I spoke with Autumn [place]. We have discussed possible surgery dates and 08/18/2021 was agreed upon by all parties. Patient given information about surgery date, what to expect pre-operatively and post operatively.    We discussed that a pre-op nurse will be calling to set up the pre-op visit that will take place prior to surgery. Informed patient that our office will communicate any additional care to be provided after surgery.    Patients questions or concerns were discussed during our call. Advised to call our office should there be any additional information, questions or concerns that arise. Patient verbalized understanding.     Patient will have possible repeat ureteroscopy in 2 weeks for stone extraction if Dr. Alyson Ingles is unable to completed on 08/18/2021. Discussed future two week surgery date as well and agreed upon 09/01/2021.

## 2021-08-18 ENCOUNTER — Ambulatory Visit (HOSPITAL_COMMUNITY): Payer: Medicare Other

## 2021-08-18 ENCOUNTER — Ambulatory Visit (HOSPITAL_COMMUNITY): Payer: Medicare Other | Admitting: Anesthesiology

## 2021-08-18 ENCOUNTER — Encounter (HOSPITAL_COMMUNITY): Payer: Self-pay | Admitting: Urology

## 2021-08-18 ENCOUNTER — Encounter (HOSPITAL_COMMUNITY)
Admission: RE | Disposition: A | Discharge status: Home / Self Care | Payer: Self-pay | Source: Home / Self Care | Attending: Urology

## 2021-08-18 ENCOUNTER — Ambulatory Visit (HOSPITAL_COMMUNITY)
Admission: RE | Admit: 2021-08-18 | Discharge: 2021-08-18 | Disposition: A | Discharge status: Home / Self Care | Payer: Medicare Other | Attending: Urology | Admitting: Urology

## 2021-08-18 ENCOUNTER — Ambulatory Visit (HOSPITAL_BASED_OUTPATIENT_CLINIC_OR_DEPARTMENT_OTHER): Payer: Medicare Other | Admitting: Anesthesiology

## 2021-08-18 DIAGNOSIS — I10 Essential (primary) hypertension: Secondary | ICD-10-CM

## 2021-08-18 DIAGNOSIS — N201 Calculus of ureter: Secondary | ICD-10-CM | POA: Insufficient documentation

## 2021-08-18 DIAGNOSIS — N1330 Unspecified hydronephrosis: Secondary | ICD-10-CM

## 2021-08-18 DIAGNOSIS — M1990 Unspecified osteoarthritis, unspecified site: Secondary | ICD-10-CM | POA: Diagnosis not present

## 2021-08-18 DIAGNOSIS — K219 Gastro-esophageal reflux disease without esophagitis: Secondary | ICD-10-CM | POA: Insufficient documentation

## 2021-08-18 DIAGNOSIS — D649 Anemia, unspecified: Secondary | ICD-10-CM | POA: Diagnosis not present

## 2021-08-18 DIAGNOSIS — F1120 Opioid dependence, uncomplicated: Secondary | ICD-10-CM | POA: Insufficient documentation

## 2021-08-18 HISTORY — PX: CYSTOSCOPY W/ URETERAL STENT PLACEMENT: SHX1429

## 2021-08-18 SURGERY — CYSTOSCOPY, WITH RETROGRADE PYELOGRAM AND URETERAL STENT INSERTION
Anesthesia: General | Site: Ureter | Laterality: Bilateral

## 2021-08-18 MED ORDER — GLYCOPYRROLATE 0.2 MG/ML IJ SOLN
0.2000 mg | Freq: Once | INTRAMUSCULAR | Status: AC
  Administered 2021-08-18: 0.2 mg via INTRAVENOUS
  Filled 2021-08-18: qty 1

## 2021-08-18 MED ORDER — PHENYLEPHRINE 80 MCG/ML (10ML) SYRINGE FOR IV PUSH (FOR BLOOD PRESSURE SUPPORT)
PREFILLED_SYRINGE | INTRAVENOUS | Status: AC
  Filled 2021-08-18: qty 10

## 2021-08-18 MED ORDER — DEXAMETHASONE SODIUM PHOSPHATE 10 MG/ML IJ SOLN
INTRAMUSCULAR | Status: AC
  Filled 2021-08-18: qty 2

## 2021-08-18 MED ORDER — STERILE WATER FOR IRRIGATION IR SOLN
Status: DC | PRN
  Administered 2021-08-18: 500 mL

## 2021-08-18 MED ORDER — PROPOFOL 10 MG/ML IV BOLUS
INTRAVENOUS | Status: AC
  Filled 2021-08-18: qty 20

## 2021-08-18 MED ORDER — ONDANSETRON HCL 4 MG/2ML IJ SOLN
INTRAMUSCULAR | Status: DC | PRN
  Administered 2021-08-18: 4 mg via INTRAVENOUS

## 2021-08-18 MED ORDER — LIDOCAINE HCL (CARDIAC) PF 50 MG/5ML IV SOSY
PREFILLED_SYRINGE | INTRAVENOUS | Status: DC | PRN
  Administered 2021-08-18: 40 mg via INTRAVENOUS

## 2021-08-18 MED ORDER — OXYBUTYNIN CHLORIDE 5 MG PO TABS: 5.0000 mg | ORAL_TABLET | Freq: Three times a day (TID) | ORAL | 1 refills | Status: DC | PRN

## 2021-08-18 MED ORDER — EPHEDRINE SULFATE (PRESSORS) 50 MG/ML IJ SOLN
INTRAMUSCULAR | Status: DC | PRN
  Administered 2021-08-18: 5 mg via INTRAVENOUS

## 2021-08-18 MED ORDER — FENTANYL CITRATE (PF) 100 MCG/2ML IJ SOLN
INTRAMUSCULAR | Status: DC | PRN
  Administered 2021-08-18: 50 ug via INTRAVENOUS
  Administered 2021-08-18 (×2): 25 ug via INTRAVENOUS

## 2021-08-18 MED ORDER — PROPOFOL 10 MG/ML IV BOLUS
INTRAVENOUS | Status: DC | PRN
  Administered 2021-08-18: 170 mg via INTRAVENOUS

## 2021-08-18 MED ORDER — CEFAZOLIN SODIUM-DEXTROSE 2-4 GM/100ML-% IV SOLN
2.0000 g | INTRAVENOUS | Status: AC
Start: 2021-08-18 — End: 2021-08-18
  Administered 2021-08-18: 2 g via INTRAVENOUS
  Filled 2021-08-18: qty 100

## 2021-08-18 MED ORDER — LIDOCAINE HCL (PF) 2 % IJ SOLN
INTRAMUSCULAR | Status: AC
  Filled 2021-08-18: qty 5

## 2021-08-18 MED ORDER — ONDANSETRON HCL 4 MG PO TABS: 4.0000 mg | ORAL_TABLET | Freq: Every day | ORAL | 1 refills | Status: AC | PRN

## 2021-08-18 MED ORDER — LACTATED RINGERS IV SOLN
INTRAVENOUS | Status: DC
  Administered 2021-08-18: 1000 mL via INTRAVENOUS

## 2021-08-18 MED ORDER — SODIUM CHLORIDE 0.9 % IR SOLN
Status: DC | PRN
  Administered 2021-08-18 (×2): 3000 mL

## 2021-08-18 MED ORDER — FENTANYL CITRATE (PF) 100 MCG/2ML IJ SOLN
INTRAMUSCULAR | Status: AC
  Filled 2021-08-18: qty 2

## 2021-08-18 MED ORDER — SUCCINYLCHOLINE CHLORIDE 200 MG/10ML IV SOSY
PREFILLED_SYRINGE | INTRAVENOUS | Status: AC
  Filled 2021-08-18: qty 10

## 2021-08-18 MED ORDER — OXYCODONE-ACETAMINOPHEN 5-325 MG PO TABS: 1.0000 | ORAL_TABLET | ORAL | 0 refills | Status: DC | PRN

## 2021-08-18 MED ORDER — ONDANSETRON HCL 4 MG/2ML IJ SOLN: 4.0000 mg | Freq: Once | INTRAMUSCULAR | Status: DC | PRN

## 2021-08-18 MED ORDER — EPHEDRINE 5 MG/ML INJ
INTRAVENOUS | Status: AC
  Filled 2021-08-18: qty 5

## 2021-08-18 MED ORDER — DIATRIZOATE MEGLUMINE 30 % UR SOLN
URETHRAL | Status: DC | PRN
  Administered 2021-08-18: 20 mL via URETHRAL

## 2021-08-18 MED ORDER — HYDROMORPHONE HCL 1 MG/ML IJ SOLN: 0.2500 mg | INTRAMUSCULAR | Status: DC | PRN

## 2021-08-18 MED ORDER — DEXAMETHASONE SODIUM PHOSPHATE 10 MG/ML IJ SOLN
INTRAMUSCULAR | Status: DC | PRN
  Administered 2021-08-18: 10 mg via INTRAVENOUS

## 2021-08-18 MED ORDER — ONDANSETRON HCL 4 MG/2ML IJ SOLN
INTRAMUSCULAR | Status: AC
  Filled 2021-08-18: qty 2

## 2021-08-18 MED ORDER — DIATRIZOATE MEGLUMINE 30 % UR SOLN
URETHRAL | Status: AC
  Filled 2021-08-18: qty 100

## 2021-08-18 SURGICAL SUPPLY — 21 items
BAG DRAIN URO TABLE W/ADPT NS (BAG) ×2 IMPLANT
BAG DRN 8 ADPR NS SKTRN CSTL (BAG) ×1
BAG HAMPER (MISCELLANEOUS) ×2 IMPLANT
CATH INTERMIT  6FR 70CM (CATHETERS) ×2 IMPLANT
CLOTH BEACON ORANGE TIMEOUT ST (SAFETY) ×2 IMPLANT
DECANTER SPIKE VIAL GLASS SM (MISCELLANEOUS) ×2 IMPLANT
GLOVE BIO SURGEON STRL SZ8 (GLOVE) ×2 IMPLANT
GLOVE BIOGEL PI IND STRL 7.0 (GLOVE) ×2 IMPLANT
GLOVE BIOGEL PI INDICATOR 7.0 (GLOVE) ×2
GOWN STRL REUS W/TWL LRG LVL3 (GOWN DISPOSABLE) ×2 IMPLANT
GOWN STRL REUS W/TWL XL LVL3 (GOWN DISPOSABLE) ×2 IMPLANT
GUIDEWIRE STR ZIPWIRE 035X150 (MISCELLANEOUS) ×2 IMPLANT
IV NS IRRIG 3000ML ARTHROMATIC (IV SOLUTION) ×3 IMPLANT
KIT TURNOVER CYSTO (KITS) ×2 IMPLANT
MANIFOLD NEPTUNE II (INSTRUMENTS) ×2 IMPLANT
PACK CYSTO (CUSTOM PROCEDURE TRAY) ×2 IMPLANT
PAD ARMBOARD 7.5X6 YLW CONV (MISCELLANEOUS) ×2 IMPLANT
STENT URET 6FRX24 CONTOUR (STENTS) ×2 IMPLANT
SYR 10ML LL (SYRINGE) ×2 IMPLANT
TOWEL OR 17X26 4PK STRL BLUE (TOWEL DISPOSABLE) ×2 IMPLANT
WATER STERILE IRR 500ML POUR (IV SOLUTION) ×2 IMPLANT

## 2021-08-18 NOTE — Anesthesia Postprocedure Evaluation (Signed)
Anesthesia Post Note  Patient: Autumn [place]  Procedure(s) Performed: CYSTOSCOPY WITH  BILATERAL RETROGRADE PYELOGRAM BILATERAL Wyvonnia Dusky STENT PLACEMENT (Bilateral: Ureter)  Patient location during evaluation: Phase II Anesthesia Type: General Level of consciousness: awake and alert and oriented Pain management: pain level controlled Vital Signs Assessment: post-procedure vital signs reviewed and stable Respiratory status: spontaneous breathing, nonlabored ventilation and respiratory function stable Cardiovascular status: blood pressure returned to baseline and stable Postop Assessment: no apparent nausea or vomiting Anesthetic complications: no   No notable events documented.   Last Vitals:  Vitals:   08/18/21 1358 08/18/21 1515  BP: (!) 124/59 127/73  Pulse:  (!) 103  Resp: 16 14  Temp: 36.5 C 36.6 C  SpO2: 97% 97%    Last Pain:  Vitals:   08/18/21 1515  TempSrc:   PainSc: 3                  Tobi Leinweber C Jaymeson Mengel

## 2021-08-18 NOTE — Anesthesia Procedure Notes (Signed)
Procedure Name: LMA Insertion Date/Time: 08/18/2021 2:46 PM  Performed by: Karna Dupes, CRNAPre-anesthesia Checklist: Emergency Drugs available, Patient identified, Suction available and Patient being monitored Patient Re-evaluated:Patient Re-evaluated prior to induction Oxygen Delivery Method: Circle system utilized Preoxygenation: Pre-oxygenation with 100% oxygen Induction Type: IV induction LMA: LMA inserted LMA Size: 3.0 Number of attempts: 1 Placement Confirmation: positive ETCO2 and breath sounds checked- equal and bilateral Tube secured with: Tape Dental Injury: Teeth and Oropharynx as per pre-operative assessment

## 2021-08-18 NOTE — Interval H&P Note (Signed)
History and Physical Interval Note:  08/18/2021 1:42 PM  Autumn [place]  has presented today for surgery, with the diagnosis of bilateral ureteral calculi.  The various methods of treatment have been discussed with the patient and family. After consideration of risks, benefits and other options for treatment, the patient has consented to  Procedure(s): CYSTOSCOPY WITH  BILATERAL RETROGRADE PYELOGRAM BILATERAL /URETERAL STENT PLACEMENT (Bilateral) as a surgical intervention.  The patient's history has been reviewed, patient examined, no change in status, stable for surgery.  I have reviewed the patient's chart and labs.  Questions were answered to the patient's satisfaction.     [name]

## 2021-08-18 NOTE — Anesthesia Preprocedure Evaluation (Addendum)
Anesthesia Evaluation  Patient identified by MRN, date of birth, ID band Patient awake    Reviewed: Allergy & Precautions, NPO status , Patient's Chart, lab work & pertinent test results  History of Anesthesia Complications (+) PONV and history of anesthetic complications  Airway Mallampati: II  TM Distance: >3 FB Neck ROM: Full    Dental  (+) Dental Advisory Given, Caps,    Pulmonary neg pulmonary ROS,    Pulmonary exam normal breath sounds clear to auscultation       Cardiovascular hypertension, Pt. on medications Normal cardiovascular exam Rhythm:Regular Rate:Normal     Neuro/Psych  Neuromuscular disease negative psych ROS   GI/Hepatic hiatal hernia, GERD  Medicated and Controlled,(+)     substance abuse  ,   Endo/Other  negative endocrine ROS  Renal/GU Renal InsufficiencyRenal disease  negative genitourinary   Musculoskeletal  (+) Arthritis , Osteoarthritis,  narcotic dependent  Abdominal   Peds negative pediatric ROS (+)  Hematology  (+) Blood dyscrasia, anemia ,   Anesthesia Other Findings Back pain  Reproductive/Obstetrics negative OB ROS                          Anesthesia Physical Anesthesia Plan  ASA: 2  Anesthesia Plan: General   Post-op Pain Management: Dilaudid IV   Induction: Intravenous  PONV Risk Score and Plan: 4 or greater and Ondansetron and Dexamethasone  Airway Management Planned: LMA  Additional Equipment:   Intra-op Plan:   Post-operative Plan: Extubation in OR  Informed Consent: I have reviewed the patients History and Physical, chart, labs and discussed the procedure including the risks, benefits and alternatives for the proposed anesthesia with the patient or authorized representative who has indicated his/her understanding and acceptance.     Dental advisory given  Plan Discussed with: CRNA and Surgeon  Anesthesia Plan Comments:        Anesthesia Quick Evaluation

## 2021-08-18 NOTE — Transfer of Care (Signed)
Immediate Anesthesia Transfer of Care Note  Patient: Autumn [place]  Procedure(s) Performed: CYSTOSCOPY WITH  BILATERAL RETROGRADE PYELOGRAM BILATERAL Wyvonnia Dusky STENT PLACEMENT (Bilateral: Ureter)  Patient Location: PACU  Anesthesia Type:General  Level of Consciousness: awake, alert  and oriented  Airway & Oxygen Therapy: Patient Spontanous Breathing  Post-op Assessment: Report given to RN and Post -op Vital signs reviewed and stable  Post vital signs: Reviewed and stable  Last Vitals:  Vitals Value Taken Time  BP 127/73 08/18/21 1514  Temp    Pulse 103 08/18/21 1515  Resp 14 08/18/21 1515  SpO2 97 % 08/18/21 1515  Vitals shown include unvalidated device data.  Last Pain:  Vitals:   08/18/21 1358  TempSrc: Oral  PainSc: 0-No pain      Patients Stated Pain Goal: 7 (56/81/27 5170)  Complications: No notable events documented.

## 2021-08-23 ENCOUNTER — Encounter (HOSPITAL_COMMUNITY): Payer: Self-pay | Admitting: Urology

## 2021-08-29 ENCOUNTER — Encounter (HOSPITAL_COMMUNITY): Payer: Self-pay

## 2021-08-29 ENCOUNTER — Encounter (HOSPITAL_COMMUNITY)
Admission: RE | Admit: 2021-08-29 | Discharge: 2021-08-29 | Disposition: A | Discharge status: Home / Self Care | Payer: Medicare Other | Source: Ambulatory Visit | Attending: Urology | Admitting: Urology

## 2021-08-29 DIAGNOSIS — Z01818 Encounter for other preprocedural examination: Secondary | ICD-10-CM

## 2021-08-29 HISTORY — DX: Malignant (primary) neoplasm, unspecified: C80.1

## 2021-09-01 ENCOUNTER — Ambulatory Visit (HOSPITAL_COMMUNITY): Payer: Medicare Other

## 2021-09-01 ENCOUNTER — Encounter (HOSPITAL_COMMUNITY): Payer: Self-pay | Admitting: Urology

## 2021-09-01 ENCOUNTER — Encounter (HOSPITAL_COMMUNITY)
Admission: RE | Disposition: A | Discharge status: Home / Self Care | Payer: Self-pay | Source: Home / Self Care | Attending: Urology

## 2021-09-01 ENCOUNTER — Ambulatory Visit (HOSPITAL_COMMUNITY)
Admission: RE | Admit: 2021-09-01 | Discharge: 2021-09-01 | Disposition: A | Discharge status: Home / Self Care | Payer: Medicare Other | Attending: Urology | Admitting: Urology

## 2021-09-01 ENCOUNTER — Ambulatory Visit (HOSPITAL_COMMUNITY): Payer: Medicare Other | Admitting: Anesthesiology

## 2021-09-01 ENCOUNTER — Ambulatory Visit (HOSPITAL_BASED_OUTPATIENT_CLINIC_OR_DEPARTMENT_OTHER): Payer: Medicare Other | Admitting: Anesthesiology

## 2021-09-01 ENCOUNTER — Other Ambulatory Visit: Payer: Self-pay

## 2021-09-01 DIAGNOSIS — K219 Gastro-esophageal reflux disease without esophagitis: Secondary | ICD-10-CM | POA: Insufficient documentation

## 2021-09-01 DIAGNOSIS — I10 Essential (primary) hypertension: Secondary | ICD-10-CM | POA: Diagnosis not present

## 2021-09-01 DIAGNOSIS — N132 Hydronephrosis with renal and ureteral calculous obstruction: Secondary | ICD-10-CM | POA: Diagnosis not present

## 2021-09-01 DIAGNOSIS — M549 Dorsalgia, unspecified: Secondary | ICD-10-CM | POA: Insufficient documentation

## 2021-09-01 DIAGNOSIS — M1990 Unspecified osteoarthritis, unspecified site: Secondary | ICD-10-CM | POA: Insufficient documentation

## 2021-09-01 DIAGNOSIS — Z01818 Encounter for other preprocedural examination: Secondary | ICD-10-CM

## 2021-09-01 DIAGNOSIS — G8929 Other chronic pain: Secondary | ICD-10-CM | POA: Insufficient documentation

## 2021-09-01 DIAGNOSIS — G709 Myoneural disorder, unspecified: Secondary | ICD-10-CM | POA: Insufficient documentation

## 2021-09-01 DIAGNOSIS — N200 Calculus of kidney: Secondary | ICD-10-CM

## 2021-09-01 DIAGNOSIS — N1330 Unspecified hydronephrosis: Secondary | ICD-10-CM

## 2021-09-01 DIAGNOSIS — K449 Diaphragmatic hernia without obstruction or gangrene: Secondary | ICD-10-CM | POA: Diagnosis not present

## 2021-09-01 HISTORY — PX: HOLMIUM LASER APPLICATION: SHX5852

## 2021-09-01 HISTORY — PX: CYSTOSCOPY WITH RETROGRADE PYELOGRAM, URETEROSCOPY AND STENT PLACEMENT: SHX5789

## 2021-09-01 LAB — SURGICAL PCR SCREEN
MRSA, PCR: POSITIVE — AB
Staphylococcus aureus: POSITIVE — AB

## 2021-09-01 SURGERY — CYSTOURETEROSCOPY, WITH RETROGRADE PYELOGRAM AND STENT INSERTION
Anesthesia: General | Site: Ureter | Laterality: Bilateral

## 2021-09-01 MED ORDER — WATER FOR IRRIGATION, STERILE IR SOLN
Status: DC | PRN
  Administered 2021-09-01: 500 mL

## 2021-09-01 MED ORDER — EPHEDRINE 5 MG/ML INJ
INTRAVENOUS | Status: AC
  Filled 2021-09-01: qty 5

## 2021-09-01 MED ORDER — SODIUM CHLORIDE 0.9 % IR SOLN
Status: DC | PRN
  Administered 2021-09-01 (×2): 3000 mL

## 2021-09-01 MED ORDER — LACTATED RINGERS IV SOLN: INTRAVENOUS | Status: DC | PRN

## 2021-09-01 MED ORDER — ONDANSETRON HCL 4 MG/2ML IJ SOLN
INTRAMUSCULAR | Status: AC
  Filled 2021-09-01: qty 2

## 2021-09-01 MED ORDER — FENTANYL CITRATE (PF) 100 MCG/2ML IJ SOLN
INTRAMUSCULAR | Status: AC
  Filled 2021-09-01: qty 2

## 2021-09-01 MED ORDER — LIDOCAINE HCL (PF) 2 % IJ SOLN
INTRAMUSCULAR | Status: AC
  Filled 2021-09-01: qty 5

## 2021-09-01 MED ORDER — DIATRIZOATE MEGLUMINE 30 % UR SOLN
URETHRAL | Status: AC
  Filled 2021-09-01: qty 100

## 2021-09-01 MED ORDER — SODIUM CHLORIDE 0.9 % IV SOLN: Freq: Once | INTRAVENOUS | Status: AC

## 2021-09-01 MED ORDER — EPHEDRINE SULFATE (PRESSORS) 50 MG/ML IJ SOLN
INTRAMUSCULAR | Status: DC | PRN
  Administered 2021-09-01 (×3): 5 mg via INTRAVENOUS

## 2021-09-01 MED ORDER — OXYCODONE-ACETAMINOPHEN 5-325 MG PO TABS: 1.0000 | ORAL_TABLET | ORAL | 0 refills | Status: DC | PRN

## 2021-09-01 MED ORDER — PROPOFOL 10 MG/ML IV BOLUS
INTRAVENOUS | Status: DC | PRN
  Administered 2021-09-01: 140 mg via INTRAVENOUS

## 2021-09-01 MED ORDER — ONDANSETRON HCL 4 MG PO TABS: 4.0000 mg | ORAL_TABLET | Freq: Every day | ORAL | 1 refills | Status: DC | PRN

## 2021-09-01 MED ORDER — ONDANSETRON HCL 4 MG/2ML IJ SOLN: 4.0000 mg | Freq: Once | INTRAMUSCULAR | Status: DC | PRN

## 2021-09-01 MED ORDER — GLYCOPYRROLATE 0.2 MG/ML IJ SOLN: 0.2000 mg | Freq: Once | INTRAMUSCULAR | Status: AC

## 2021-09-01 MED ORDER — DIATRIZOATE MEGLUMINE 30 % UR SOLN
URETHRAL | Status: DC | PRN
  Administered 2021-09-01: 100 mL via URETHRAL

## 2021-09-01 MED ORDER — DEXAMETHASONE SODIUM PHOSPHATE 10 MG/ML IJ SOLN
INTRAMUSCULAR | Status: DC | PRN
  Administered 2021-09-01: 5 mg via INTRAVENOUS

## 2021-09-01 MED ORDER — SODIUM CHLORIDE 0.9 % IV SOLN
INTRAVENOUS | Status: AC
  Filled 2021-09-01: qty 20

## 2021-09-01 MED ORDER — PROPOFOL 10 MG/ML IV BOLUS
INTRAVENOUS | Status: AC
  Filled 2021-09-01: qty 20

## 2021-09-01 MED ORDER — SODIUM CHLORIDE 0.9 % IV SOLN
2.0000 g | INTRAVENOUS | Status: AC
  Administered 2021-09-01: 2 g via INTRAVENOUS

## 2021-09-01 MED ORDER — GLYCOPYRROLATE 0.2 MG/ML IJ SOLN
INTRAMUSCULAR | Status: AC
  Administered 2021-09-01: 0.2 mg via INTRAVENOUS
  Filled 2021-09-01: qty 1

## 2021-09-01 MED ORDER — LIDOCAINE HCL (CARDIAC) PF 50 MG/5ML IV SOSY
PREFILLED_SYRINGE | INTRAVENOUS | Status: DC | PRN
  Administered 2021-09-01: 80 mg via INTRAVENOUS

## 2021-09-01 MED ORDER — LACTATED RINGERS IV SOLN: INTRAVENOUS | Status: DC

## 2021-09-01 MED ORDER — FENTANYL CITRATE (PF) 100 MCG/2ML IJ SOLN
INTRAMUSCULAR | Status: DC | PRN
  Administered 2021-09-01: 25 ug via INTRAVENOUS
  Administered 2021-09-01: 50 ug via INTRAVENOUS
  Administered 2021-09-01: 25 ug via INTRAVENOUS

## 2021-09-01 MED ORDER — HYDROMORPHONE HCL 1 MG/ML IJ SOLN
0.2500 mg | INTRAMUSCULAR | Status: DC | PRN
  Administered 2021-09-01: 0.5 mg via INTRAVENOUS
  Filled 2021-09-01 (×2): qty 0.5

## 2021-09-01 MED ORDER — ONDANSETRON HCL 4 MG/2ML IJ SOLN
INTRAMUSCULAR | Status: DC | PRN
  Administered 2021-09-01: 4 mg via INTRAVENOUS

## 2021-09-01 SURGICAL SUPPLY — 25 items
BAG DRAIN URO TABLE W/ADPT NS (BAG) ×2 IMPLANT
BAG DRN 8 ADPR NS SKTRN CSTL (BAG) ×1
BAG HAMPER (MISCELLANEOUS) ×2 IMPLANT
CATH INTERMIT  6FR 70CM (CATHETERS) ×2 IMPLANT
CLOTH BEACON ORANGE TIMEOUT ST (SAFETY) ×2 IMPLANT
EXTRACTOR STONE NITINOL NGAGE (UROLOGICAL SUPPLIES) ×1 IMPLANT
GLOVE BIO SURGEON STRL SZ8 (GLOVE) ×2 IMPLANT
GLOVE BIOGEL PI IND STRL 7.0 (GLOVE) ×2 IMPLANT
GLOVE BIOGEL PI INDICATOR 7.0 (GLOVE) ×2
GOWN STRL REUS W/TWL LRG LVL3 (GOWN DISPOSABLE) ×2 IMPLANT
GOWN STRL REUS W/TWL XL LVL3 (GOWN DISPOSABLE) ×2 IMPLANT
GUIDEWIRE STR DUAL SENSOR (WIRE) ×2 IMPLANT
GUIDEWIRE STR ZIPWIRE 035X150 (MISCELLANEOUS) ×2 IMPLANT
IV NS IRRIG 3000ML ARTHROMATIC (IV SOLUTION) ×4 IMPLANT
KIT TURNOVER CYSTO (KITS) ×2 IMPLANT
MANIFOLD NEPTUNE II (INSTRUMENTS) ×2 IMPLANT
PACK CYSTO (CUSTOM PROCEDURE TRAY) ×2 IMPLANT
PAD ARMBOARD 7.5X6 YLW CONV (MISCELLANEOUS) ×2 IMPLANT
SHEATH URETERAL 12FRX35CM (MISCELLANEOUS) ×1 IMPLANT
STENT URET 6FRX24 CONTOUR (STENTS) ×1 IMPLANT
SYR 10ML LL (SYRINGE) ×2 IMPLANT
SYR CONTROL 10ML LL (SYRINGE) ×2 IMPLANT
TRACTIP FLEXIVA PULS ID 200XHI (Laser) IMPLANT
TRACTIP FLEXIVA PULSE ID 200 (Laser) ×2
WATER STERILE IRR 500ML POUR (IV SOLUTION) ×2 IMPLANT

## 2021-09-01 NOTE — Anesthesia Postprocedure Evaluation (Signed)
Anesthesia Post Note  Patient: Autumn [place]  Procedure(s) Performed: CYSTOSCOPY WITH  bilateral RETROGRADE PYELOGRAM, URETEROSCOPY AND  bilateral STENT exchange (Bilateral: Ureter) HOLMIUM LASER APPLICATION (Bilateral: Ureter)  Patient location during evaluation: Phase II Anesthesia Type: General Level of consciousness: awake and alert and oriented Pain management: pain level controlled Vital Signs Assessment: post-procedure vital signs reviewed and stable Respiratory status: spontaneous breathing, nonlabored ventilation and respiratory function stable Cardiovascular status: blood pressure returned to baseline and stable Postop Assessment: no apparent nausea or vomiting Anesthetic complications: no   No notable events documented.   Last Vitals:  Vitals:   09/01/21 0945 09/01/21 0958  BP: 136/67 129/76  Pulse: 72 72  Resp: 10 20  Temp:  36.6 C  SpO2: 93% 98%    Last Pain:  Vitals:   09/01/21 0958  TempSrc: Oral  PainSc: 5                  Dacota Devall C Anamari Galeas

## 2021-09-01 NOTE — Interval H&P Note (Signed)
History and Physical Interval Note:  09/01/2021 7:33 AM  Autumn [place]  has presented today for surgery, with the diagnosis of bilateral ureteral calculi.  The various methods of treatment have been discussed with the patient and family. After consideration of risks, benefits and other options for treatment, the patient has consented to  Procedure(s): CYSTOSCOPY WITH  bilateral RETROGRADE PYELOGRAM, URETEROSCOPY AND  bilateral STENT exchange (Bilateral) HOLMIUM LASER APPLICATION (Bilateral) as a surgical intervention.  The patient's history has been reviewed, patient examined, no change in status, stable for surgery.  I have reviewed the patient's chart and labs.  Questions were answered to the patient's satisfaction.     [name]

## 2021-09-01 NOTE — Anesthesia Preprocedure Evaluation (Signed)
Anesthesia Evaluation  Patient identified by MRN, date of birth, ID band Patient awake    Reviewed: Allergy & Precautions, NPO status , Patient's Chart, lab work & pertinent test results  History of Anesthesia Complications (+) PONV and history of anesthetic complications  Airway Mallampati: II  TM Distance: >3 FB Neck ROM: Full    Dental  (+) Dental Advisory Given, Caps,    Pulmonary neg pulmonary ROS,    Pulmonary exam normal breath sounds clear to auscultation       Cardiovascular hypertension, Pt. on medications Normal cardiovascular exam Rhythm:Regular Rate:Normal     Neuro/Psych  Neuromuscular disease negative psych ROS   GI/Hepatic Neg liver ROS, hiatal hernia, GERD  Medicated and Controlled,  Endo/Other  negative endocrine ROS  Renal/GU Renal InsufficiencyRenal disease (bilateral hydronephrosis)  negative genitourinary   Musculoskeletal  (+) Arthritis , Osteoarthritis,  narcotic dependent  Abdominal   Peds negative pediatric ROS (+)  Hematology  (+) Blood dyscrasia, anemia ,   Anesthesia Other Findings Chronic back pain  Reproductive/Obstetrics negative OB ROS                             Anesthesia Physical  Anesthesia Plan  ASA: 3  Anesthesia Plan: General   Post-op Pain Management: Dilaudid IV   Induction: Intravenous  PONV Risk Score and Plan: 4 or greater and Ondansetron and Dexamethasone  Airway Management Planned: LMA  Additional Equipment:   Intra-op Plan:   Post-operative Plan: Extubation in OR  Informed Consent: I have reviewed the patients History and Physical, chart, labs and discussed the procedure including the risks, benefits and alternatives for the proposed anesthesia with the patient or authorized representative who has indicated his/her understanding and acceptance.     Dental advisory given  Plan Discussed with: CRNA and  Surgeon  Anesthesia Plan Comments:         Anesthesia Quick Evaluation

## 2021-09-01 NOTE — Progress Notes (Signed)
09/01/21 1020-Lab called and stated that Autumn Duran was MRSA positive. Dr Alyson Ingles notified. No new orders given.

## 2021-09-01 NOTE — Op Note (Signed)
Marland KitchenPreoperative diagnosis: bilateral renal calculi  Postoperative diagnosis: Same  Procedure: 1 cystoscopy 2. Bilateral retrograde pyelography 3.  Intraoperative fluoroscopy, under one hour, with interpretation 4.  Bilateral ureteroscopic stone manipulation with laser lithotripsy 5.  right 6 x 24 JJ stent placement  Attending: Rosie Fate  Anesthesia: General  Estimated blood loss: None  Drains: right 6 x 24 JJ ureteral stent without tether  Specimens: stone for analysis  Antibiotics: ancef  Findings: bilateral mid and lower pole renal calculi. Moderate bilateral hydronephrosis. No masses/lesions in the bladder. Ureteral orifices in normal anatomic location.  Indications: Patient is a 71 year old female with a history of bilateral ureteral calculi who underwent stent placement 2 weeks ago. After discussing treatment options, they decided proceed with bilateral ureteroscopic stone manipulation.  Procedure in detail: The patient was brought to the operating room and a brief timeout was done to ensure correct patient, correct procedure, correct site.  General anesthesia was administered patient was placed in dorsal lithotomy position.  Her genitalia was then prepped and draped in usual sterile fashion.  A rigid 68 French cystoscope was passed in the urethra and the bladder.  Bladder was inspected free masses or lesions.  the ureteral orifices were in the normal orthotopic locations. a 6 french ureteral catheter was then instilled into the left ureteral orifice.  a gentle retrograde was obtained and findings noted above. Using a grasper the left ureteral stent was brought to the urethral meatus. We then advanced a zipwire through the stent and up to the renal pelvis.  The stent was then removed. we then removed the cystoscope and cannulated the left ureteral orifice with a semirigid ureteroscope.  We located no stone in the ureter. We then placed a sensor wire up to the renal pelvis. We  removed the scope and advanced a 12/14 x 35cm access sheath up to the renal pelvis. We then used the flexible ureteroscope to perform nephroscopy. We located calculi in the mid and lower poles which were fragmented with a 242nm laser fiber. The stones were removed with an NGage basket. Once the stone were removed we then removed the access sheath under direct vision and noted to injury to the ureter.  We elected not to place a stent.  We then turned out attention to the right side. a 6 french ureteral catheter was then instilled into the right ureteral orifice.  a gentle retrograde was obtained and findings noted above. Using a grasper the right ureteral stent was brought to the urethral meatus. We then advanced a zipwire through the stent and up to the renal pelvis.  The stent was then removed.   we then removed the cystoscope and cannulated the right ureteral orifice with a semirigid ureteroscope.  We located no stone in the ureter. We then placed a sensor wire up to the renal pelvis. We removed the scope and advanced a 12/14 x 35cm access sheath up to the renal pelvis. We then used the flexible ureteroscope to perform nephroscopy. We located calculi in the mid and lower poles which were fragmented with a 242nm laser fiber. The fragments were removed with an NGage basket. Once the stone were removed we then removed the access sheath under direct vision and noted to injury to the ureter. we then placed a 6 x 24 double-j ureteral stent over the original zip wire.  We then removed the wire and good coil was noted in the the renal pelvis under fluoroscopy and the bladder under direct vision.   the  bladder was then drained and this concluded the procedure which was well tolerated by patient.  Complications: None  Condition: Stable, extubated, transferred to PACU  Plan: Patient is to be discharged home as to follow-up in 1 week for stent removal

## 2021-09-01 NOTE — Anesthesia Procedure Notes (Signed)
Procedure Name: LMA Insertion Date/Time: 09/01/2021 7:47 AM  Performed by: Vista Deck, CRNAPre-anesthesia Checklist: Patient identified, Patient being monitored, Timeout performed, Emergency Drugs available and Suction available Patient Re-evaluated:Patient Re-evaluated prior to induction Oxygen Delivery Method: Circle System Utilized Preoxygenation: Pre-oxygenation with 100% oxygen Induction Type: IV induction Ventilation: Mask ventilation without difficulty LMA: LMA inserted LMA Size: 3.0 Number of attempts: 1 Placement Confirmation: positive ETCO2 and breath sounds checked- equal and bilateral Tube secured with: Tape Dental Injury: Teeth and Oropharynx as per pre-operative assessment

## 2021-09-01 NOTE — Transfer of Care (Signed)
Immediate Anesthesia Transfer of Care Note  Patient: Autumn Duran  Procedure(s) Performed: CYSTOSCOPY WITH  bilateral RETROGRADE PYELOGRAM, URETEROSCOPY AND  bilateral STENT exchange (Bilateral: Ureter) HOLMIUM LASER APPLICATION (Bilateral: Ureter)  Patient Location: PACU  Anesthesia Type:General  Level of Consciousness: awake and patient cooperative  Airway & Oxygen Therapy: Patient Spontanous Breathing  Post-op Assessment: Report given to RN and Post -op Vital signs reviewed and stable  Post vital signs: Reviewed and stable  Last Vitals:  Vitals Value Taken Time  BP 151/74 0909  Temp 98.2 0909  Pulse 92 0909  Resp 16 0909  SpO2 93 0909    Last Pain: There were no vitals filed for this visit.       Complications: No notable events documented.

## 2021-09-06 ENCOUNTER — Encounter (HOSPITAL_COMMUNITY): Payer: Self-pay | Admitting: Urology

## 2021-09-07 LAB — CALCULI, WITH PHOTOGRAPH (CLINICAL LAB)
Calcium Oxalate Dihydrate: 30 %
Calcium Oxalate Monohydrate: 70 %
Weight Calculi: 31 mg

## 2021-09-08 ENCOUNTER — Ambulatory Visit (INDEPENDENT_AMBULATORY_CARE_PROVIDER_SITE_OTHER): Payer: Medicare Other | Admitting: Urology

## 2021-09-08 ENCOUNTER — Encounter: Payer: Self-pay | Admitting: Urology

## 2021-09-08 VITALS — BP 132/67 | HR 61 | Ht 60.0 in | Wt 157.0 lb

## 2021-09-08 DIAGNOSIS — N200 Calculus of kidney: Secondary | ICD-10-CM

## 2021-09-08 LAB — URINALYSIS, ROUTINE W REFLEX MICROSCOPIC
Bilirubin, UA: NEGATIVE
Glucose, UA: NEGATIVE
Nitrite, UA: NEGATIVE
Specific Gravity, UA: 1.02 (ref 1.005–1.030)
Urobilinogen, Ur: 1 mg/dL (ref 0.2–1.0)
pH, UA: 6 (ref 5.0–7.5)

## 2021-09-08 LAB — MICROSCOPIC EXAMINATION: Renal Epithel, UA: NONE SEEN /hpf

## 2021-09-08 MED ORDER — CIPROFLOXACIN HCL 500 MG PO TABS
500.0000 mg | ORAL_TABLET | Freq: Once | ORAL | Status: AC
  Administered 2021-09-08: 500 mg via ORAL

## 2021-09-08 NOTE — Progress Notes (Signed)
 Assessment: 1. Nephrolithiasis     Plan: Right ureteral stent removed today. Cipro x1 following stent removal. Return to office in 6 weeks with Dr. McKenzie. Renal U/S prior to visit  Chief Complaint: Chief Complaint  Patient presents with   Nephrolithiasis    HPI: Autumn Duran is a 70 y.o. female who presents for continued evaluation of bilateral nephrolithiasis.  She is status post bilateral ureteroscopy and stone manipulation on 03/04/2021 by Dr. McKenzie.  She has a right ureteral stent in place.  She has done well since the procedure.  She presents today for stent removal.  She has completed her antibiotics.  No dysuria or gross hematuria.  No flank pain.  No fevers or chills.   Portions of the above documentation were copied from a prior visit for review purposes only.  Allergies: Allergies  Allergen Reactions   Chlorhexidine Gluconate Itching    Pt c/o itching after abd wipes.    Morphine And Related Nausea And Vomiting    Headache   Tetracycline Nausea Only    PMH: Past Medical History:  Diagnosis Date   Bronchitis, chronic (HCC)    Cancer (HCC)    Chronic back pain    Chronic kidney disease    kidney stone s/p stent placement ( removed)   Complication of anesthesia    Compression fracture of L3 lumbar vertebra    Diverticulosis    GERD (gastroesophageal reflux disease)    Hemorrhoids    Hiatal hernia    History of kidney stones    Hx of adenomatous colonic polyps    Hyperlipidemia    Hypertension    Lumbar radicular pain    Osteoporosis 12/10/2017   PONV (postoperative nausea and vomiting)    Sore throat    T7 vertebral fracture (HCC)     PSH: Past Surgical History:  Procedure Laterality Date   BACK SURGERY     BONE MARROW ASPIRATION Left 02/2014   BONE MARROW BIOPSY Left 02/2014   CHOLECYSTECTOMY     COLONOSCOPY  12/18/2011   Procedure: COLONOSCOPY;  Surgeon: Dora M Brodie, MD;  Location: [facility];  Service: Endoscopy;  Laterality:  N/A;   COLONOSCOPY WITH PROPOFOL N/A 08/03/2017   Procedure: COLONOSCOPY WITH PROPOFOL;  Surgeon: Rehman, Najeeb U, MD;  Location: [facility];  Service: Endoscopy;  Laterality: N/A;   CYSTOSCOPY W/ URETERAL STENT PLACEMENT Bilateral 08/18/2021   Procedure: CYSTOSCOPY WITH  BILATERAL RETROGRADE PYELOGRAM BILATERAL /URETERAL STENT PLACEMENT;  Surgeon: McKenzie, Patrick L, MD;  Location: [facility];  Service: Urology;  Laterality: Bilateral;   CYSTOSCOPY WITH RETROGRADE PYELOGRAM, URETEROSCOPY AND STENT PLACEMENT Bilateral 09/01/2021   Procedure: CYSTOSCOPY WITH  bilateral RETROGRADE PYELOGRAM, URETEROSCOPY AND  bilateral STENT exchange;  Surgeon: McKenzie, Patrick L, MD;  Location: [facility];  Service: Urology;  Laterality: Bilateral;   CYSTOSCOPY/RETROGRADE/URETEROSCOPY/STONE EXTRACTION WITH BASKET Bilateral 04/18/2018   Procedure: CYSTOSCOPY/BILATERAL RETROGRADE/URETEROSCOPY/STONE EXTRACTION WITH BASKET WITH RIGHT STENT PLACEMENT;  Surgeon: McKenzie, Patrick L, MD;  Location: [facility];  Service: Urology;  Laterality: Bilateral;  1 HR   ESOPHAGEAL DILATION N/A 08/03/2017   Procedure: ESOPHAGEAL DILATION;  Surgeon: Rehman, Najeeb U, MD;  [facility];  Service: Endoscopy;  Laterality: N/A;   ESOPHAGOGASTRODUODENOSCOPY  12/18/2011   Procedure: ESOPHAGOGASTRODUODENOSCOPY (EGD);  Surgeon: Dora M Brodie, MD;  Location: [facility];  Service: Endoscopy;  Laterality: N/A;   ESOPHAGOGASTRODUODENOSCOPY (EGD) WITH PROPOFOL N/A 08/03/2017   Procedure: ESOPHAGOGASTRODUODENOSCOPY (EGD) WITH PROPOFOL;  Surgeon: Rehman, Najeeb U, MD;  [facility]: [facility];  Service: Endoscopy;  Laterality: N/A;  pt knows to arrive at [facility] Right 4/33/2951   Procedure: HOLMIUM LASER APPLICATION;  Surgeon: Cleon Gustin, MD;  Location: [facility];  Service: Urology;  Laterality: Right;   HOLMIUM LASER APPLICATION Bilateral 10/04/4164   Procedure: HOLMIUM LASER APPLICATION;  Surgeon:  Cleon Gustin, MD;  Location: [facility];  Service: Urology;  Laterality: Bilateral;   LUMBAR LAMINECTOMY FOR EPIDURAL ABSCESS Left 09/22/2013   Procedure: LUMBAR LAMINECTOMY FOR EPIDURAL ABSCESS LEFT LUMBAR FIVE-SACRAL ONE;  Surgeon: Charlie Pitter, MD;  Location: [facility];  Service: Neurosurgery;  Laterality: Left;   RADIOLOGY WITH ANESTHESIA N/A 08/13/2014   Procedure: RADIOLOGY WITH ANESTHESIA ;  Surgeon: Medication Radiologist, MD;  Location: [facility];  Service: Radiology;  Laterality: N/A;   URETERAL STENT PLACEMENT     VERTEBROPLASTY N/A 07/14/2013   Procedure: VERTEBROPLASTY WITH LUMBAR THREE BIOPSY;  Surgeon: Charlie Pitter, MD;  Location: [facility];  Service: Neurosurgery;  Laterality: N/A;  VERTEBROPLASTY WITH LUMBAR THREE BIOPSY    SH: Social History   Tobacco Use   Smoking status: Never   Smokeless tobacco: Never  Vaping Use   Vaping Use: Never used  Substance Use Topics   Alcohol use: Yes    Comment: Special occasions   Drug use: No    ROS: Constitutional:  Negative for fever, chills, weight loss CV: Negative for chest pain, previous MI, hypertension Respiratory:  Negative for shortness of breath, wheezing, sleep apnea, frequent cough GI:  Negative for nausea, vomiting, bloody stool, GERD  PE: BP 132/67   Pulse 61   Ht 5' (1.524 m)   Wt 157 lb (71.2 kg)   BMI 30.66 kg/m  GENERAL APPEARANCE:  Well appearing, well developed, well nourished, NAD HEENT:  Atraumatic, normocephalic, oropharynx clear NECK:  Supple without lymphadenopathy or thyromegaly ABDOMEN:  Soft, non-tender, no masses EXTREMITIES:  Moves all extremities well, without clubbing, cyanosis, or edema NEUROLOGIC:  Alert and oriented x 3, normal gait, CN II-XII grossly intact MENTAL STATUS:  appropriate BACK:  Non-tender to palpation, No CVAT SKIN:  Warm, dry, and intact   Results: U/A: 11-30 WBCs, 11-30 RBCs, many bacteria, nitrite negative  Procedure:  Flexible  Cystourethroscopy/Ureteral stent removal  Pre-operative Diagnosis:  Bilateral nephrolithiasis  Post-operative Diagnosis:  Bilateral nephrolithiasis  Anesthesia:  local with lidocaine jelly  Surgical Narrative:  After appropriate informed consent was obtained, the patient was prepped and draped in the usual sterile fashion in the supine position.  The patient was correctly identified and the proper procedure delineated prior to proceeding.  Sterile lidocaine gel was instilled in the urethra. The flexible cystoscope was introduced without difficulty. Using stent graspers, the right ureteral stent was removed. The cystoscope was then removed.  The patient tolerated the procedure well. A chaperone was present throughout the procedure.

## 2021-09-12 ENCOUNTER — Other Ambulatory Visit (HOSPITAL_COMMUNITY): Payer: Self-pay | Admitting: Hematology

## 2021-09-12 DIAGNOSIS — E559 Vitamin D deficiency, unspecified: Secondary | ICD-10-CM

## 2021-10-04 ENCOUNTER — Other Ambulatory Visit: Payer: Self-pay | Admitting: Urology

## 2021-10-10 ENCOUNTER — Ambulatory Visit (HOSPITAL_COMMUNITY)
Admission: RE | Admit: 2021-10-10 | Discharge: 2021-10-10 | Disposition: A | Discharge status: Home / Self Care | Payer: Medicare Other | Source: Ambulatory Visit | Attending: Urology | Admitting: Urology

## 2021-10-10 DIAGNOSIS — N200 Calculus of kidney: Secondary | ICD-10-CM

## 2021-10-19 ENCOUNTER — Encounter: Payer: Self-pay | Admitting: Urology

## 2021-10-19 ENCOUNTER — Ambulatory Visit: Payer: Medicare Other | Admitting: Urology

## 2021-10-19 VITALS — BP 153/67 | HR 60

## 2021-10-19 DIAGNOSIS — N132 Hydronephrosis with renal and ureteral calculous obstruction: Secondary | ICD-10-CM

## 2021-10-19 DIAGNOSIS — N200 Calculus of kidney: Secondary | ICD-10-CM

## 2021-10-19 NOTE — Progress Notes (Signed)
10/19/2021 9:49 AM   Autumn Duran [birth year] [id number]  Referring provider: [name], MD [place],  [place] [address]  Followup nephrolithiasis   HPI: [name] is a 70yo here for followup for nephrolithiasis. No stone events since last visit. Renal US 8/14 shows improvement in bilateral nephrolithiasis. She denies any flank pain. No issues with urination. No hematuria.   PMH: Past Medical History:  Diagnosis Date   Bronchitis, chronic (HCC)    Cancer (HCC)    Chronic back pain    Chronic kidney disease    kidney stone s/p stent placement ( removed)   Complication of anesthesia    Compression fracture of L3 lumbar vertebra    Diverticulosis    GERD (gastroesophageal reflux disease)    Hemorrhoids    Hiatal hernia    History of kidney stones    Hx of adenomatous colonic polyps    Hyperlipidemia    Hypertension    Lumbar radicular pain    Osteoporosis 12/10/2017   PONV (postoperative nausea and vomiting)    Sore throat    T7 vertebral fracture [facility])     Surgical History: Past Surgical History:  Procedure Laterality Date   BACK SURGERY     BONE MARROW ASPIRATION Left 02/2014   BONE MARROW BIOPSY Left 02/2014   CHOLECYSTECTOMY     COLONOSCOPY  12/18/2011   Procedure: COLONOSCOPY;  Surgeon: Lafayette Dragon, MD;  Location: [facility];  Service: Endoscopy;  Laterality: N/A;   COLONOSCOPY WITH PROPOFOL N/A 08/03/2017   Procedure: COLONOSCOPY WITH PROPOFOL;  Surgeon: Rogene Houston, MD;  [facility];  Service: Endoscopy;  Laterality: N/A;   CYSTOSCOPY W/ URETERAL STENT PLACEMENT Bilateral 08/18/2021   Procedure: CYSTOSCOPY WITH  BILATERAL RETROGRADE PYELOGRAM BILATERAL Wyvonnia Dusky STENT PLACEMENT;  Surgeon: Cleon Gustin, MD;  Location: [facility];  Service: Urology;  Laterality: Bilateral;   CYSTOSCOPY WITH RETROGRADE PYELOGRAM, URETEROSCOPY AND STENT PLACEMENT Bilateral 09/01/2021   Procedure: CYSTOSCOPY WITH  bilateral RETROGRADE PYELOGRAM,  URETEROSCOPY AND  bilateral STENT exchange;  Surgeon: Cleon Gustin, MD;  Location: [facility];  Service: Urology;  Laterality: Bilateral;   CYSTOSCOPY/RETROGRADE/URETEROSCOPY/STONE EXTRACTION WITH BASKET Bilateral 04/18/2018   Procedure: CYSTOSCOPY/BILATERAL RETROGRADE/URETEROSCOPY/STONE EXTRACTION WITH BASKET WITH RIGHT STENT PLACEMENT;  Surgeon: Cleon Gustin, MD;  Location: [facility];  Service: Urology;  Laterality: Bilateral;  1 HR   ESOPHAGEAL DILATION N/A 08/03/2017   Procedure: ESOPHAGEAL DILATION;  Surgeon: Rogene Houston, MD;  [facility];  Service: Endoscopy;  Laterality: N/A;   ESOPHAGOGASTRODUODENOSCOPY  12/18/2011   Procedure: ESOPHAGOGASTRODUODENOSCOPY (EGD);  Surgeon: Lafayette Dragon, MD;  Location: [facility];  Service: Endoscopy;  Laterality: N/A;   ESOPHAGOGASTRODUODENOSCOPY (EGD) WITH PROPOFOL N/A 08/03/2017   Procedure: ESOPHAGOGASTRODUODENOSCOPY (EGD) WITH PROPOFOL;  Surgeon: Rogene Houston, MD;  [facility];  Service: Endoscopy;  Laterality: N/A;  pt knows to arrive at [place] Right 05/10/9700   Procedure: HOLMIUM LASER APPLICATION;  Surgeon: Cleon Gustin, MD;  Location: [facility];  Service: Urology;  Laterality: Right;   HOLMIUM LASER APPLICATION Bilateral 07/30/7856   Procedure: HOLMIUM LASER APPLICATION;  Surgeon: Cleon Gustin, MD;  Location: [facility];  Service: Urology;  Laterality: Bilateral;   LUMBAR LAMINECTOMY FOR EPIDURAL ABSCESS Left 09/22/2013   Procedure: LUMBAR LAMINECTOMY FOR EPIDURAL ABSCESS LEFT LUMBAR FIVE-SACRAL ONE;  Surgeon: Charlie Pitter, MD;  Location: [facility];  Service: Neurosurgery;  Laterality: Left;   RADIOLOGY WITH ANESTHESIA N/A 08/13/2014   Procedure: RADIOLOGY WITH ANESTHESIA ;  Surgeon: Medication Radiologist, MD;  Location: [facility];  Service: Radiology;  Laterality: N/A;   URETERAL STENT PLACEMENT     VERTEBROPLASTY N/A 07/14/2013   Procedure: VERTEBROPLASTY  WITH LUMBAR THREE BIOPSY;  Surgeon: Charlie Pitter, MD;  Location: [facility];  Service: Neurosurgery;  Laterality: N/A;  VERTEBROPLASTY WITH LUMBAR THREE BIOPSY    Home Medications:  Allergies as of 10/19/2021       Reactions   Chlorhexidine Gluconate Itching   Pt c/o itching after abd wipes.    Morphine And Related Nausea And Vomiting   Headache   Tetracycline Nausea Only        Medication List        Accurate as of October 19, 2021  9:49 AM. If you have any questions, ask your nurse or doctor.          azaTHIOprine 50 MG tablet Commonly known as: IMURAN Take 2 tablets (100 mg total) by mouth daily. What changed: when to take this   Azelastine HCl 0.15 % Soln Place 1 spray into the nose daily as needed (allergies.).   HYDROcodone-acetaminophen 5-325 MG tablet Commonly known as: NORCO/VICODIN Take 1 tablet by mouth every 6 (six) hours as needed (severe pain.).   hydrocortisone 1 % ointment Apply 1 application topically 2 (two) times daily. What changed:  when to take this reasons to take this   omeprazole 20 MG capsule Commonly known as: PRILOSEC Take 20 mg by mouth daily before breakfast.   ondansetron 4 MG tablet Commonly known as: Zofran Take 1 tablet (4 mg total) by mouth daily as needed for nausea or vomiting.   ondansetron 4 MG tablet Commonly known as: Zofran Take 1 tablet (4 mg total) by mouth daily as needed for nausea or vomiting.   oxybutynin 5 MG tablet Commonly known as: DITROPAN TAKE 1 TABLET BY MOUTH EVERY 8 HOURS AS NEEDED FOR BLADDER SPASM   oxyCODONE 20 mg 12 hr tablet Commonly known as: OXYCONTIN Take 20 mg by mouth in the morning.   oxyCODONE-acetaminophen 5-325 MG tablet Commonly known as: Percocet Take 1 tablet by mouth every 4 (four) hours as needed for severe pain.   predniSONE 2.5 MG tablet Commonly known as: DELTASONE Take 1 tablet (2.5 mg total) by mouth daily with breakfast.   THERATEARS OP Place 1 drop into both eyes  3 (three) times daily as needed (dry/irritated eyes.).   Vitamin D (Ergocalciferol) 1.25 MG (50000 UNIT) Caps capsule Commonly known as: DRISDOL TAKE 1 CAPSULE BY MOUTH ONCE A WEEK        Allergies:  Allergies  Allergen Reactions   Chlorhexidine Gluconate Itching    Pt c/o itching after abd wipes.    Morphine And Related Nausea And Vomiting    Headache   Tetracycline Nausea Only    Family History: Family History  Problem Relation Age of Onset   Breast cancer Sister 60   Breast cancer Other 38       aunt   Hypertension Mother    COPD Mother    COPD Father    Hypertension Sister    Aortic aneurysm Sister    Hypertension Son     Social History:  reports that she has never smoked. She has never used smokeless tobacco. She reports current alcohol use. She reports that she does not use drugs.  ROS: All other review of systems were  reviewed and are negative except what is noted above in HPI  Physical Exam: BP (!) 153/67   Pulse 60   Constitutional:  Alert and oriented, No acute distress. HEENT: [place] AT, moist mucus membranes.  Trachea midline, no masses. Cardiovascular: No clubbing, cyanosis, or edema. Respiratory: Normal respiratory effort, no increased work of breathing. GI: Abdomen is soft, nontender, nondistended, no abdominal masses GU: No CVA tenderness.  Lymph: No cervical or inguinal lymphadenopathy. Skin: No rashes, bruises or suspicious lesions. Neurologic: Grossly intact, no focal deficits, moving all 4 extremities. Psychiatric: Normal mood and affect.  Laboratory Data: Lab Results  Component Value Date   WBC 7.7 08/02/2021   HGB 13.7 08/02/2021   HCT 41.0 08/02/2021   MCV 88.7 08/02/2021   PLT 292 08/02/2021    Lab Results  Component Value Date   CREATININE 1.83 (H) 08/08/2021    No results found for: "PSA"  No results found for: "TESTOSTERONE"  No results found for: "HGBA1C"  Urinalysis    Component Value Date/Time   COLORURINE YELLOW  08/02/2021 1411   APPEARANCEUR Clear 09/08/2021 1016   LABSPEC 1.015 08/02/2021 1411   PHURINE 5.5 08/02/2021 1411   GLUCOSEU Negative 09/08/2021 1016   HGBUR NEGATIVE 08/02/2021 1411   BILIRUBINUR Negative 09/08/2021 1016   KETONESUR NEGATIVE 08/02/2021 1411   PROTEINUR 3+ (A) 09/08/2021 1016   PROTEINUR TRACE (A) 08/02/2021 1411   UROBILINOGEN 0.2 08/13/2014 0850   NITRITE Negative 09/08/2021 1016   NITRITE NEGATIVE 08/02/2021 1411   LEUKOCYTESUR 1+ (A) 09/08/2021 1016   LEUKOCYTESUR 3+ (A) 08/02/2021 1411    Lab Results  Component Value Date   LABMICR See below: 09/08/2021   WBCUA 11-30 (A) 09/08/2021   LABEPIT 0-10 09/08/2021   MUCUS Present 09/08/2021   BACTERIA Many (A) 09/08/2021    Pertinent Imaging: Renal US 10/10/2021: Images reviewed and discussed with the patient  No results found for this or any previous visit.  Results for orders placed during the hospital encounter of 12/18/13  US Venous Img Lower Bilateral  Narrative CLINICAL DATA:  Leg swelling  EXAM: BILATERAL LOWER EXTREMITY VENOUS DOPPLER ULTRASOUND  TECHNIQUE: Gray-scale sonography with graded compression, as well as color Doppler and duplex ultrasound were performed to evaluate the lower extremity deep venous systems from the level of the common femoral vein and including the common femoral, femoral, profunda femoral, popliteal and calf veins including the posterior tibial, peroneal and gastrocnemius veins when visible. The superficial great saphenous vein was also interrogated. Spectral Doppler was utilized to evaluate flow at rest and with distal augmentation maneuvers in the common femoral, femoral and popliteal veins.  COMPARISON:  None.  FINDINGS: RIGHT LOWER EXTREMITY  Common Femoral Vein: No evidence of thrombus. Normal compressibility, respiratory phasicity and response to augmentation.  Saphenofemoral Junction: No evidence of thrombus. Normal compressibility and flow on color  Doppler imaging.  Profunda Femoral Vein: No evidence of thrombus. Normal compressibility and flow on color Doppler imaging.  Femoral Vein: No evidence of thrombus. Normal compressibility, respiratory phasicity and response to augmentation.  Popliteal Vein: No evidence of thrombus. Normal compressibility, respiratory phasicity and response to augmentation.  Calf Veins: No evidence of thrombus. Normal compressibility and flow on color Doppler imaging.  Superficial Great Saphenous Vein: No evidence of thrombus. Normal compressibility and flow on color Doppler imaging.  Venous Reflux:  None.  Other Findings:  None.  LEFT LOWER EXTREMITY  Common Femoral Vein: No evidence of thrombus. Normal compressibility, respiratory phasicity and response to augmentation.  Saphenofemoral Junction: No evidence of thrombus. Normal compressibility and flow on color Doppler imaging.  Profunda Femoral Vein: No evidence of thrombus. Normal compressibility and flow on color Doppler imaging.  Femoral Vein: No evidence of thrombus. Normal compressibility, respiratory phasicity and response to augmentation.  Popliteal Vein: No evidence of thrombus. Normal compressibility, respiratory phasicity and response to augmentation.  Calf Veins: Not well visualized.  Superficial Great Saphenous Vein: No evidence of thrombus. Normal compressibility and flow on color Doppler imaging.  Venous Reflux:  None.  Other Findings:  None.  IMPRESSION: No evidence of deep venous thrombosis.   Electronically Signed By: Kathreen Devoid On: 12/18/2013 13:37  No results found for this or any previous visit.  No results found for this or any previous visit.  Results for orders placed during the hospital encounter of 10/10/21  US RENAL  Narrative CLINICAL DATA:  Nephrolithiasis and hydronephrosis follow-up.  EXAM: RENAL / URINARY TRACT ULTRASOUND COMPLETE  COMPARISON:  CT scan of the abdomen and pelvis August 16, 2021. Renal ultrasound August 14, 2021.  FINDINGS: Right Kidney:  Renal measurements: 10.4 x 5.0 x 5.1 cm = volume: 140.3 mL. Mild persistent hydronephrosis, improved in the interval. No stones or masses identified.  Left Kidney:  Renal measurements: 10.0 x 4.4 x 4.2 cm = volume: 96.3 mL. Moderate hydronephrosis remains, stable to mildly improved in the interval. Multiple stones are identified in the left kidney. The largest measures 12 mm at the upper pole.  Bladder:  The bladder is not well assessed due to lack of distention.  Other:  None.  IMPRESSION: 1. Mild hydronephrosis remains on the right, improved in the interval. 2. Moderate hydronephrosis remains on the left, stable to mildly improved in the interval. Multiple stones remain on the left with the largest measuring 12 mm in the upper pole. 3. The bladder is not well assessed due to lack of distention.   Electronically Signed By: Dorise Bullion III M.D. On: 10/11/2021 09:06  No results found for this or any previous visit.  No results found for this or any previous visit.  Results for orders placed in visit on 08/16/21  CT RENAL STONE STUDY  Narrative CLINICAL DATA:  Flank pain.  Rule out kidney stone.  EXAM: CT ABDOMEN AND PELVIS WITHOUT CONTRAST  TECHNIQUE: Multidetector CT imaging of the abdomen and pelvis was performed following the standard protocol without IV contrast.  RADIATION DOSE REDUCTION: This exam was performed according to the departmental dose-optimization program which includes automated exposure control, adjustment of the mA and/or kV according to patient size and/or use of iterative reconstruction technique.  COMPARISON:  02/26/2019  FINDINGS: Lower chest: Scarring and volume loss within the right middle lobe and lingula is again noted in appears similar to previous exam. Lingular and left lower lobe bronchial wall thickening identified. This is similar to previous exam  and likely postinflammatory.  Hepatobiliary: 2.5 cm cyst within left hepatic lobe is unchanged, image 18/2. Gallbladder appears normal. Fusiform dilatation of the common bile duct is again noted measuring up to 1.5 cm. No signs of obstructing stone or mass.  Pancreas: Unremarkable. No pancreatic ductal dilatation or surrounding inflammatory changes.  Spleen: Normal in size without focal abnormality.  Adrenals/Urinary Tract:  Normal adrenal glands.  Bilateral nephrolithiasis. The largest right kidney stone is in the interpolar collecting system measuring 7 mm, image 64/5. The largest left renal calculi is in the upper pole measuring 4 mm, image 66/5. There is bilateral renal cortical thinning and  moderate to severe hydronephrosis. At the right UPJ there are 2 stones identified measuring up to 4 mm, image 61/5. At the left UPJ there are 2 tiny stones which measure up to 3 mm, image 63/5. No stones identified distal to the UPJ, bilaterally. Urinary bladder appears normal.  Stomach/Bowel: Postsurgical change noted at the GE junction. The stomach appears nondistended. The appendix is visualized and appears normal. No signs of bowel wall thickening, inflammation, or distension.  Vascular/Lymphatic: Aortic atherosclerosis. No signs of abdominopelvic adenopathy  Reproductive: Uterus and bilateral adnexa are unremarkable.  Other: No free fluid or fluid collections. No signs of pneumoperitoneum.  Musculoskeletal: There is a compression fracture involving the L1 vertebral body with loss of 50% of the vertebral body height. Slightly progressive when compared with lumbar spine plain film radiographs from 03/02/2020. Status post vertebroplasty of chronic L3 compression deformity. Anterolisthesis L4 on L5 is again noted. Remote, healed changes discitis/osteomyelitis noted at L2-3.  IMPRESSION: 1. Moderate to severe bilateral hydronephrosis secondary to bilateral UPJ calculi. 2.  Bilateral renal calculi. 3. L1 compression fracture with loss of 50% of the vertebral body height. Slightly progressive when compared with lumbar spine plain film radiographs from 02/26/2019. 4. Remote, healed changes discitis/osteomyelitis at L2-3. 5. Stable fusiform dilatation of the common bile duct measuring up to 1.5 cm. 6. Aortic Atherosclerosis (ICD10-I70.0).   Electronically Signed By: Kerby Moors M.D. On: 08/16/2021 12:52   Assessment & Plan:    1. Nephrolithiasis --RTC 6 months with a renAL US - Urinalysis, Routine w reflex microscopic   No follow-ups on file.  [name], MD  [facility] [place]

## 2021-10-19 NOTE — Patient Instructions (Signed)

## 2021-10-20 LAB — MICROSCOPIC EXAMINATION
Epithelial Cells (non renal): >10 /hpf — AB (ref 0–10)
RBC, Urine: NONE SEEN /hpf (ref 0–2)
Renal Epithel, UA: NONE SEEN /hpf

## 2021-10-20 LAB — URINALYSIS, ROUTINE W REFLEX MICROSCOPIC
Bilirubin, UA: NEGATIVE
Glucose, UA: NEGATIVE
Ketones, UA: NEGATIVE
Nitrite, UA: NEGATIVE
Protein,UA: NEGATIVE
RBC, UA: NEGATIVE
Specific Gravity, UA: 1.015 (ref 1.005–1.030)
Urobilinogen, Ur: 0.2 mg/dL (ref 0.2–1.0)
pH, UA: 7 (ref 5.0–7.5)

## 2021-11-02 NOTE — Progress Notes (Deleted)
Office Visit Note  Patient: Autumn Duran             Date of Birth: March 15, 1950           MRN: [id number]             PCP: [name], MD Referring: [name], MD Visit Date: 11/07/2021   Subjective:  No chief complaint on file.   History of Present Illness: Autumn Duran is a 71 y.o. female here for follow up for retroperitoneal fibrosis    Previous HPI 08/02/2021 Autumn Duran is a 71 y.o. female here for follow up for retroperitoneal fibrosis on azathioprine 100 mg daily. She is doing well overall she had one episode of right flank and lower abdominal pain lasting about 1 week in duration. She also started Diovan 80-12.36m for hypertension with improvement in headaches and blood pressure. She is not requiring and furosemide and no recent edema. She does report some increase in urinary frequency during the day times, with occasional leaking during coughing or sneezing. No pain with urination though. She does not recall exactly when this started.     Previous HPI 05/02/2021 Autumn JETERis a 71y.o. female here for follow up for retroperitoneal fibrosis on azathioprine 100 mg daily. She discontinued prednisone after tapering she reported some increased pain in her hand and bones. This started about 3 weeks after she stopped the 2.5 mg prednisone. She also noticed a large increase in pedal edema with stopping prednisone that cleared up after 3 doses of lasix prescribed from PCP office. She has some hand pain worst in the 5th fingers and low back pain. Her left leg and knee gave her some trouble also more stiffness and weakness than pain. She resumed taking prednisone 2.5 mg about 2 weeks ago with a large improvement in symptoms again.  She had one episode of right sided abdominal pain starting to the side and moving to the center in lower abdomen then resolved. She took one percocet due to severity of pain. She never saw any frank blood or stone during this.   Previous  HPI 01/31/21 Autumn LAURELis a 71y.o. female here for follow up of retroperitoneal fibrosis with hydronephrosis on azathioprine 100 mg PO daily and prednisone 5 mg daily. She tapered prednisone down from 15 mg to 5 mg daily since last visit without noticing specific problems. She had URI symptoms with congestion and drainage treated with amoxicillin. Weight gain with central adiposity increase she reports 16 pounds total. She notices some sensitivity with monitor bumps playing with her grandson. She is scheduled for renal ultrasound on 12/8 for monitoring by Dr. MAlyson Ingles   Previous HPI 10/11/20 Autumn ROCCOis a 71y.o. female here for follow up for retroperitoneal fibrosis on prednisone 15 mg daily and azathioprine 100 mg p.o. daily.  Since our last visit she became ill with COVID-pneumonia symptoms took the paxlovid treatment for this and respiratory symptoms recovered completely.  She feels overall well recovered from the problem.  The left-sided chest wall and flank pain and left arm pain from her previous fall are also improved further.  She is noticing some swelling that comes and goes in her lower extremities more than before.  Lower abdominal skin rash is improved      No Rheumatology ROS completed.   PMFS History:  Patient Active Problem List   Diagnosis Date Noted   Urinary frequency 08/02/2021   Osteoarthritis of  both hands 05/02/2021   COVID-19 10/11/2020   Right-sided back pain 10/11/2020   Left-sided chest wall pain 08/09/2020   High risk medication use 08/09/2020   Varicose veins of leg with swelling, bilateral 07/05/2020   Primary hypercholesterolemia 05/11/2020   Compression fracture of L1 lumbar vertebra ([place]) 05/11/2020   Bilateral nephrolithiasis 05/11/2020   Bilateral hydronephrosis 04/14/2020   Retroperitoneal fibrosis 03/09/2020   Degenerative scoliosis 03/05/2020   Degenerative spondylolisthesis 03/05/2020   Opioid dependence ([place]) 09/18/2019   Elevated  blood-pressure reading, without diagnosis of hypertension 01/08/2019   Closed fracture of proximal end of right humerus 06/26/2018   Paraesophageal hernia 12/12/2017   Osteoporosis 12/10/2017   Dysphagia 06/26/2017   Gastroesophageal reflux disease 06/26/2017   Hx of colonic polyps 06/26/2017   Nausea with vomiting 10/12/2014   Acute low back pain 08/13/2014   Epidural abscess 08/13/2014   Hypertension    Chronic back pain    Essential hypertension    Other iron deficiency anemia 04/10/2014   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[place]) 03/23/2014   Right hip pain 12/23/2013   Epidural abscess, L2-L5 11/04/2013   Lumbar compression fracture ([place]) 07/12/2013   Compression fracture 07/12/2013   Benign neoplasm of colon 12/18/2011   Esophageal reflux 12/18/2011   Other dysphagia 12/18/2011    Past Medical History:  Diagnosis Date   Bronchitis, chronic (HCC)    Cancer ([place])    Chronic back pain    Chronic kidney disease    kidney stone s/p stent placement ( removed)   Complication of anesthesia    Compression fracture of L3 lumbar vertebra    Diverticulosis    GERD (gastroesophageal reflux disease)    Hemorrhoids    Hiatal hernia    History of kidney stones    Hx of adenomatous colonic polyps    Hyperlipidemia    Hypertension    Lumbar radicular pain    Osteoporosis 12/10/2017   PONV (postoperative nausea and vomiting)    Sore throat    T7 vertebral fracture ([place])     Family History  Problem Relation Age of Onset   Breast cancer Sister 25   Breast cancer Other 56       aunt   Hypertension Mother    COPD Mother    COPD Father    Hypertension Sister    Aortic aneurysm Sister    Hypertension Son    Past Surgical History:  Procedure Laterality Date   BACK SURGERY     BONE MARROW ASPIRATION Left 02/2014   BONE MARROW BIOPSY Left 02/2014   CHOLECYSTECTOMY     COLONOSCOPY  12/18/2011   Procedure: COLONOSCOPY;  Surgeon: Lafayette Dragon, MD;  Location: [facility];  Service: Endoscopy;   Laterality: N/A;   COLONOSCOPY WITH PROPOFOL N/A 08/03/2017   Procedure: COLONOSCOPY WITH PROPOFOL;  Surgeon: Rogene Houston, MD;  [facility];  Service: Endoscopy;  Laterality: N/A;   CYSTOSCOPY W/ URETERAL STENT PLACEMENT Bilateral 08/18/2021   Procedure: CYSTOSCOPY WITH  BILATERAL RETROGRADE PYELOGRAM BILATERAL Wyvonnia Dusky STENT PLACEMENT;  Surgeon: Cleon Gustin, MD;  Location: [facility];  Service: Urology;  Laterality: Bilateral;   CYSTOSCOPY WITH RETROGRADE PYELOGRAM, URETEROSCOPY AND STENT PLACEMENT Bilateral 09/01/2021   Procedure: CYSTOSCOPY WITH  bilateral RETROGRADE PYELOGRAM, URETEROSCOPY AND  bilateral STENT exchange;  Surgeon: Cleon Gustin, MD;  Location: [facility];  Service: Urology;  Laterality: Bilateral;   CYSTOSCOPY/RETROGRADE/URETEROSCOPY/STONE EXTRACTION WITH BASKET Bilateral 04/18/2018   Procedure: CYSTOSCOPY/BILATERAL RETROGRADE/URETEROSCOPY/STONE EXTRACTION WITH BASKET WITH RIGHT STENT PLACEMENT;  Surgeon: Cleon Gustin, MD;  Location: [facility];  Service: Urology;  Laterality: Bilateral;  1 HR   ESOPHAGEAL DILATION N/A 08/03/2017   Procedure: ESOPHAGEAL DILATION;  Surgeon: Rogene Houston, MD;  [facility];  Service: Endoscopy;  Laterality: N/A;   ESOPHAGOGASTRODUODENOSCOPY  12/18/2011   Procedure: ESOPHAGOGASTRODUODENOSCOPY (EGD);  Surgeon: Lafayette Dragon, MD;  Location: [facility];  Service: Endoscopy;  Laterality: N/A;   ESOPHAGOGASTRODUODENOSCOPY (EGD) WITH PROPOFOL N/A 08/03/2017   Procedure: ESOPHAGOGASTRODUODENOSCOPY (EGD) WITH PROPOFOL;  Surgeon: Rogene Houston, MD;  [facility];  Service: Endoscopy;  Laterality: N/A;  pt knows to arrive at [place] Right 0/63/0160   Procedure: HOLMIUM LASER APPLICATION;  Surgeon: Cleon Gustin, MD;  Location: [facility];  Service: Urology;  Laterality: Right;   HOLMIUM LASER APPLICATION Bilateral 1/0/9323   Procedure: HOLMIUM LASER APPLICATION;   Surgeon: Cleon Gustin, MD;  Location: [facility];  Service: Urology;  Laterality: Bilateral;   LUMBAR LAMINECTOMY FOR EPIDURAL ABSCESS Left 09/22/2013   Procedure: LUMBAR LAMINECTOMY FOR EPIDURAL ABSCESS LEFT LUMBAR FIVE-SACRAL ONE;  Surgeon: Charlie Pitter, MD;  Location: [facility];  Service: Neurosurgery;  Laterality: Left;   RADIOLOGY WITH ANESTHESIA N/A 08/13/2014   Procedure: RADIOLOGY WITH ANESTHESIA ;  Surgeon: Medication Radiologist, MD;  Location: [facility] [facility];  Service: Radiology;  Laterality: N/A;   URETERAL STENT PLACEMENT     VERTEBROPLASTY N/A 07/14/2013   Procedure: VERTEBROPLASTY WITH LUMBAR THREE BIOPSY;  Surgeon: Charlie Pitter, MD;  Location: [place];  Service: Neurosurgery;  Laterality: N/A;  VERTEBROPLASTY WITH LUMBAR THREE BIOPSY   Social History   Social History Narrative   Not on file   There is no immunization history for the selected administration types on file for this patient.   Objective: Vital Signs: There were no vitals taken for this visit.   Physical Exam   Musculoskeletal Exam: ***  CDAI Exam: CDAI Score: -- Patient Global: --; Provider Global: -- Swollen: --; Tender: -- Joint Exam 11/07/2021   No joint exam has been documented for this visit   There is currently no information documented on the homunculus. Go to the Rheumatology activity and complete the homunculus joint exam.  Investigation: No additional findings.  Imaging: US RENAL  Result Date: 10/11/2021 CLINICAL DATA:  Nephrolithiasis and hydronephrosis follow-up. EXAM: RENAL / URINARY TRACT ULTRASOUND COMPLETE COMPARISON:  CT scan of the abdomen and pelvis August 16, 2021. Renal ultrasound August 14, 2021. FINDINGS: Right Kidney: Renal measurements: 10.4 x 5.0 x 5.1 cm = volume: 140.3 mL. Mild persistent hydronephrosis, improved in the interval. No stones or masses identified. Left Kidney: Renal measurements: 10.0 x 4.4 x 4.2 cm = volume: 96.3 mL. Moderate hydronephrosis remains,  stable to mildly improved in the interval. Multiple stones are identified in the left kidney. The largest measures 12 mm at the upper pole. Bladder: The bladder is not well assessed due to lack of distention. Other: None. IMPRESSION: 1. Mild hydronephrosis remains on the right, improved in the interval. 2. Moderate hydronephrosis remains on the left, stable to mildly improved in the interval. Multiple stones remain on the left with the largest measuring 12 mm in the upper pole. 3. The bladder is not well assessed due to lack of distention. Electronically Signed   By: Dorise Bullion III M.D.   On: 10/11/2021 09:06    Recent Labs: Lab Results  Component Value Date  WBC 7.7 08/02/2021   HGB 13.7 08/02/2021   PLT 292 08/02/2021   NA 141 08/08/2021   K 4.1 08/08/2021   CL 103 08/08/2021   CO2 29 08/08/2021   GLUCOSE 98 08/08/2021   BUN 32 (H) 08/08/2021   CREATININE 1.83 (H) 08/08/2021   BILITOT 0.5 08/02/2021   ALKPHOS 79 06/12/2019   AST 13 08/02/2021   ALT 4 (L) 08/02/2021   PROT 6.8 08/02/2021   ALBUMIN 3.5 06/12/2019   CALCIUM 9.9 08/08/2021   GFRAA 67 08/09/2020    Speciality Comments: No specialty comments available.  Procedures:  No procedures performed Allergies: Chlorhexidine gluconate, Morphine and related, and Tetracycline   Assessment / Plan:     Visit Diagnoses: No diagnosis found.  ***  Orders: No orders of the defined types were placed in this encounter.  No orders of the defined types were placed in this encounter.    Follow-Up Instructions: No follow-ups on file.   Bertram Savin, RT  Note - This record has been created using Editor, commissioning.  Chart creation errors have been sought, but may not always  have been located. Such creation errors do not reflect on  the standard of medical care.

## 2021-11-07 ENCOUNTER — Ambulatory Visit: Payer: Medicare Other | Admitting: Internal Medicine

## 2021-11-07 DIAGNOSIS — R350 Frequency of micturition: Secondary | ICD-10-CM

## 2021-11-07 DIAGNOSIS — N135 Crossing vessel and stricture of ureter without hydronephrosis: Secondary | ICD-10-CM

## 2021-11-07 DIAGNOSIS — Z79899 Other long term (current) drug therapy: Secondary | ICD-10-CM

## 2021-11-08 NOTE — Progress Notes (Signed)
Office Visit Note  Patient: Autumn Duran             Date of Birth: Aug 20, 1950           MRN: [id number]             PCP: [name], MD Referring: [name], MD Visit Date: 11/09/2021   Subjective:  Follow-up (Feeling pretty good today. )   History of Present Illness: Autumn Duran is a 71 y.o. female here for follow up for retroperitoneal fibrosis on azathioprine 100 mg daily and prednisone 2.5 mg daily. Since our last visit she saw found to have severe hydronephrosis bilaterally and bilateral obstructing nephrolithiasis. Stent placement and stone removal and subsequent ultrasound showed very good improvement in swelling. Her flank pains also resolved after treatment of these stones.  Previous HPI 08/02/2021 Autumn Duran is a 71 y.o. female here for follow up for retroperitoneal fibrosis on azathioprine 100 mg daily. She is doing well overall she had one episode of right flank and lower abdominal pain lasting about 1 week in duration. She also started Diovan 80-12.29m for hypertension with improvement in headaches and blood pressure. She is not requiring and furosemide and no recent edema. She does report some increase in urinary frequency during the day times, with occasional leaking during coughing or sneezing. No pain with urination though. She does not recall exactly when this started.     Previous HPI 05/02/2021 Autumn SCHARNHORSTis a 71y.o. female here for follow up for retroperitoneal fibrosis on azathioprine 100 mg daily. She discontinued prednisone after tapering she reported some increased pain in her hand and bones. This started about 3 weeks after she stopped the 2.5 mg prednisone. She also noticed a large increase in pedal edema with stopping prednisone that cleared up after 3 doses of lasix prescribed from PCP office. She has some hand pain worst in the 5th fingers and low back pain. Her left leg and knee gave her some trouble also more stiffness and weakness than pain.  She resumed taking prednisone 2.5 mg about 2 weeks ago with a large improvement in symptoms again.  She had one episode of right sided abdominal pain starting to the side and moving to the center in lower abdomen then resolved. She took one percocet due to severity of pain. She never saw any frank blood or stone during this.   Previous HPI 01/31/21 Autumn BOLINGis a 71y.o. female here for follow up of retroperitoneal fibrosis with hydronephrosis on azathioprine 100 mg PO daily and prednisone 5 mg daily. She tapered prednisone down from 15 mg to 5 mg daily since last visit without noticing specific problems. She had URI symptoms with congestion and drainage treated with amoxicillin. Weight gain with central adiposity increase she reports 16 pounds total. She notices some sensitivity with monitor bumps playing with her grandson. She is scheduled for renal ultrasound on 12/8 for monitoring by Dr. MAlyson Ingles   Previous HPI 10/11/20 Autumn CARBONELLis a 71y.o. female here for follow up for retroperitoneal fibrosis on prednisone 15 mg daily and azathioprine 100 mg p.o. daily.  Since our last visit she became ill with COVID-pneumonia symptoms took the paxlovid treatment for this and respiratory symptoms recovered completely.  She feels overall well recovered from the problem.  The left-sided chest wall and flank pain and left arm pain from her previous fall are also improved further.  She is noticing some swelling that  comes and goes in her lower extremities more than before.  Lower abdominal skin rash is improved     Review of Systems  Constitutional:  Negative for fatigue.  HENT:  Negative for mouth sores and mouth dryness.   Eyes:  Positive for dryness.  Respiratory:  Negative for shortness of breath.   Cardiovascular:  Negative for chest pain and palpitations.  Gastrointestinal:  Positive for constipation. Negative for blood in stool and diarrhea.  Endocrine: Negative for increased urination.   Genitourinary:  Negative for involuntary urination.  Musculoskeletal:  Positive for morning stiffness. Negative for joint pain, gait problem, joint pain, joint swelling, myalgias, muscle weakness, muscle tenderness and myalgias.  Skin:  Positive for hair loss. Negative for color change, rash and sensitivity to sunlight.  Allergic/Immunologic: Negative for susceptible to infections.  Neurological:  Negative for dizziness and headaches.  Hematological:  Negative for swollen glands.  Psychiatric/Behavioral:  Positive for sleep disturbance. Negative for depressed mood. The patient is not nervous/anxious.     PMFS History:  Patient Active Problem List   Diagnosis Date Noted   Urinary frequency 08/02/2021   Osteoarthritis of both hands 05/02/2021   COVID-19 10/11/2020   Right-sided back pain 10/11/2020   Left-sided chest wall pain 08/09/2020   High risk medication use 08/09/2020   Varicose veins of leg with swelling, bilateral 07/05/2020   Primary hypercholesterolemia 05/11/2020   Compression fracture of L1 lumbar vertebra ([place]) 05/11/2020   Bilateral nephrolithiasis 05/11/2020   Bilateral hydronephrosis 04/14/2020   Retroperitoneal fibrosis 03/09/2020   Degenerative scoliosis 03/05/2020   Degenerative spondylolisthesis 03/05/2020   Opioid dependence ([place]) 09/18/2019   Elevated blood-pressure reading, without diagnosis of hypertension 01/08/2019   Closed fracture of proximal end of right humerus 06/26/2018   Paraesophageal hernia 12/12/2017   Osteoporosis 12/10/2017   Dysphagia 06/26/2017   Gastroesophageal reflux disease 06/26/2017   Hx of colonic polyps 06/26/2017   Nausea with vomiting 10/12/2014   Acute low back pain 08/13/2014   Epidural abscess 08/13/2014   Hypertension    Chronic back pain    Essential hypertension    Other iron deficiency anemia 04/10/2014   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[place]) 03/23/2014   Right hip pain 12/23/2013   Epidural abscess, L2-L5 11/04/2013   Lumbar  compression fracture ([place]) 07/12/2013   Compression fracture 07/12/2013   Benign neoplasm of colon 12/18/2011   Esophageal reflux 12/18/2011   Other dysphagia 12/18/2011    Past Medical History:  Diagnosis Date   Bronchitis, chronic ([place])    Cancer ([facility])    Chronic back pain    Chronic kidney disease    kidney stone s/p stent placement ( removed)   Complication of anesthesia    Compression fracture of L3 lumbar vertebra    Diverticulosis    GERD (gastroesophageal reflux disease)    Hemorrhoids    Hiatal hernia    History of kidney stones    Hx of adenomatous colonic polyps    Hyperlipidemia    Hypertension    Lumbar radicular pain    Osteoporosis 12/10/2017   PONV (postoperative nausea and vomiting)    Sore throat    T7 vertebral fracture ([place])     Family History  Problem Relation Age of Onset   Breast cancer Sister 56   Breast cancer Other 45       aunt   Hypertension Mother    COPD Mother    COPD Father    Hypertension Sister    Aortic aneurysm Sister  Hypertension Son    Past Surgical History:  Procedure Laterality Date   BACK SURGERY     BONE MARROW ASPIRATION Left 02/2014   BONE MARROW BIOPSY Left 02/2014   CHOLECYSTECTOMY     COLONOSCOPY  12/18/2011   Procedure: COLONOSCOPY;  Surgeon: Hart Carwin, MD;  Location: [facility];  Service: Endoscopy;  Laterality: N/A;   COLONOSCOPY WITH PROPOFOL N/A 08/03/2017   Procedure: COLONOSCOPY WITH PROPOFOL;  Surgeon: Malissa Hippo, MD;  Location: [facility];  Service: Endoscopy;  Laterality: N/A;   CYSTOSCOPY W/ URETERAL STENT PLACEMENT Bilateral 08/18/2021   Procedure: CYSTOSCOPY WITH  BILATERAL RETROGRADE PYELOGRAM BILATERAL Otilio Miu STENT PLACEMENT;  Surgeon: Malen Gauze, MD;  Location: [facility];  Service: Urology;  Laterality: Bilateral;   CYSTOSCOPY WITH RETROGRADE PYELOGRAM, URETEROSCOPY AND STENT PLACEMENT Bilateral 09/01/2021   Procedure: CYSTOSCOPY WITH  bilateral RETROGRADE PYELOGRAM, URETEROSCOPY  AND  bilateral STENT exchange;  Surgeon: Malen Gauze, MD;  Location: [facility];  Service: Urology;  Laterality: Bilateral;   CYSTOSCOPY/RETROGRADE/URETEROSCOPY/STONE EXTRACTION WITH BASKET Bilateral 04/18/2018   Procedure: CYSTOSCOPY/BILATERAL RETROGRADE/URETEROSCOPY/STONE EXTRACTION WITH BASKET WITH RIGHT STENT PLACEMENT;  Surgeon: Malen Gauze, MD;  Location: [facility];  Service: Urology;  Laterality: Bilateral;  1 HR   ESOPHAGEAL DILATION N/A 08/03/2017   Procedure: ESOPHAGEAL DILATION;  Surgeon: Malissa Hippo, MD;  [facility];  Service: Endoscopy;  Laterality: N/A;   ESOPHAGOGASTRODUODENOSCOPY  12/18/2011   Procedure: ESOPHAGOGASTRODUODENOSCOPY (EGD);  Surgeon: Hart Carwin, MD;  Location: [facility];  Service: Endoscopy;  Laterality: N/A;   ESOPHAGOGASTRODUODENOSCOPY (EGD) WITH PROPOFOL N/A 08/03/2017   Procedure: ESOPHAGOGASTRODUODENOSCOPY (EGD) WITH PROPOFOL;  Surgeon: Malissa Hippo, MD;  [facility];  Service: Endoscopy;  Laterality: N/A;  pt knows to arrive at 6:15   HERNIA REPAIR     HOLMIUM LASER APPLICATION Right 04/18/2018   Procedure: HOLMIUM LASER APPLICATION;  Surgeon: Malen Gauze, MD;  Location: [facility];  Service: Urology;  Laterality: Right;   HOLMIUM LASER APPLICATION Bilateral 09/01/2021   Procedure: HOLMIUM LASER APPLICATION;  Surgeon: Malen Gauze, MD;  Location: [facility];  Service: Urology;  Laterality: Bilateral;   LUMBAR LAMINECTOMY FOR EPIDURAL ABSCESS Left 09/22/2013   Procedure: LUMBAR LAMINECTOMY FOR EPIDURAL ABSCESS LEFT LUMBAR FIVE-SACRAL ONE;  Surgeon: Temple Pacini, MD;  Location: [facility];  Service: Neurosurgery;  Laterality: Left;   RADIOLOGY WITH ANESTHESIA N/A 08/13/2014   Procedure: RADIOLOGY WITH ANESTHESIA ;  Surgeon: Medication Radiologist, MD;  Location: [facility];  Service: Radiology;  Laterality: N/A;   URETERAL STENT PLACEMENT     VERTEBROPLASTY N/A 07/14/2013   Procedure: VERTEBROPLASTY WITH LUMBAR  THREE BIOPSY;  Surgeon: Temple Pacini, MD;  Location: [facility];  Service: Neurosurgery;  Laterality: N/A;  VERTEBROPLASTY WITH LUMBAR THREE BIOPSY   Social History   Social History Narrative   Not on file   There is no immunization history for the selected administration types on file for this patient.   Objective: Vital Signs: BP (!) 156/80 (BP Location: Right Arm, Patient Position: Sitting, Cuff Size: Normal)   Pulse (!) 52   Resp 15   Ht 5\' 1"  (1.549 m)   Wt 154 lb (69.9 kg)   BMI 29.10 kg/m    Physical Exam Cardiovascular:     Rate and Rhythm: Normal rate and regular rhythm.  Pulmonary:     Effort: Pulmonary effort is normal.     Breath sounds: Normal breath sounds.  Musculoskeletal:  Right lower leg: No edema.     Left lower leg: No edema.  Skin:    General: Skin is warm and dry.     Findings: No rash.  Neurological:     Mental Status: She is alert.  Psychiatric:        Mood and Affect: Mood normal.      Musculoskeletal Exam:  Neck full ROM no tenderness Shoulders full ROM no tenderness or swelling Elbows full ROM no tenderness or swelling Wrists full ROM no tenderness or swelling Fingers full ROM no tenderness or swelling Knees full ROM no tenderness or swelling Ankles full ROM no tenderness or swelling   Investigation: No additional findings.  Imaging: No results found.  Recent Labs: Lab Results  Component Value Date   WBC 7.8 11/09/2021   HGB 14.3 11/09/2021   PLT 305 11/09/2021   NA 141 11/09/2021   K 4.5 11/09/2021   CL 106 11/09/2021   CO2 28 11/09/2021   GLUCOSE 86 11/09/2021   BUN 17 11/09/2021   CREATININE 1.03 (H) 11/09/2021   BILITOT 0.4 11/09/2021   ALKPHOS 79 06/12/2019   AST 13 11/09/2021   ALT 5 (L) 11/09/2021   PROT 6.5 11/09/2021   ALBUMIN 3.5 06/12/2019   CALCIUM 10.3 11/09/2021   GFRAA 67 08/09/2020    Speciality Comments: No specialty comments available.  Procedures:  No procedures performed Allergies:  Chlorhexidine gluconate, Morphine and related, and Tetracycline   Assessment / Plan:     Visit Diagnoses: Retroperitoneal fibrosis - Plan: Sedimentation rate  No new flank and abdominal pains after the kidney stones were addressed and back pain is at her baseline.  Checking sedimentation rate for disease activity monitoring.  Plan to continue the azathioprine 100 mg daily and prednisone 2.5 mg daily.  High risk medication use -  Imuran 140m by mouth daily, prednisone 2.578mpo qd - Plan: CBC with Differential/Platelet, COMPLETE METABOLIC PANEL WITH GFR  Appears to be tolerating medications fine checking CBC and CMP for azathioprine medication monitoring.  Previously recommended to stop the valsartan due to acute decline in GFR if this has improved and would benefit to resume her previous antihypertensive regimen.  Bilateral hydronephrosis Bilateral nephrolithiasis  Recent kidney stones with calcium oxalate on analysis and hydronephrosis is significantly improved now.  Does not appear to be a significant obstruction as was previously concerning for mass effect of retroperitoneal fibrosis.   Orders: Orders Placed This Encounter  Procedures   Sedimentation rate   CBC with Differential/Platelet   COMPLETE METABOLIC PANEL WITH GFR   No orders of the defined types were placed in this encounter.    Follow-Up Instructions: Return in about 3 months (around 02/08/2022) for RF on AZA/GC f/u 47m39mo  ChrCollier SalinaD  Note - This record has been created using Dra[employer]ristol[employer] SquibbChart creation errors have been sought, but may not always  have been located. Such creation errors do not reflect on  the standard of medical care.

## 2021-11-09 ENCOUNTER — Ambulatory Visit: Payer: Medicare Other | Attending: Internal Medicine | Admitting: Internal Medicine

## 2021-11-09 ENCOUNTER — Encounter: Payer: Self-pay | Admitting: Internal Medicine

## 2021-11-09 VITALS — BP 156/80 | HR 52 | Resp 15 | Ht 61.0 in | Wt 154.0 lb

## 2021-11-09 DIAGNOSIS — N135 Crossing vessel and stricture of ureter without hydronephrosis: Secondary | ICD-10-CM | POA: Diagnosis not present

## 2021-11-09 DIAGNOSIS — Z79899 Other long term (current) drug therapy: Secondary | ICD-10-CM | POA: Diagnosis not present

## 2021-11-09 DIAGNOSIS — R350 Frequency of micturition: Secondary | ICD-10-CM

## 2021-11-09 DIAGNOSIS — N1330 Unspecified hydronephrosis: Secondary | ICD-10-CM

## 2021-11-09 DIAGNOSIS — K682 Retroperitoneal fibrosis: Secondary | ICD-10-CM

## 2021-11-09 DIAGNOSIS — N200 Calculus of kidney: Secondary | ICD-10-CM

## 2021-11-10 LAB — CBC WITH DIFFERENTIAL/PLATELET
Absolute Monocytes: 445 cells/uL (ref 200–950)
Basophils Absolute: 70 cells/uL (ref 0–200)
Basophils Relative: 0.9 %
Eosinophils Absolute: 148 cells/uL (ref 15–500)
Eosinophils Relative: 1.9 %
HCT: 43.2 % (ref 35.0–45.0)
Hemoglobin: 14.3 g/dL (ref 11.7–15.5)
Lymphs Abs: 2122 cells/uL (ref 850–3900)
MCH: 30.6 pg (ref 27.0–33.0)
MCHC: 33.1 g/dL (ref 32.0–36.0)
MCV: 92.3 fL (ref 80.0–100.0)
MPV: 10.4 fL (ref 7.5–12.5)
Monocytes Relative: 5.7 %
Neutro Abs: 5015 cells/uL (ref 1500–7800)
Neutrophils Relative %: 64.3 %
Platelets: 305 10*3/uL (ref 140–400)
RBC: 4.68 10*6/uL (ref 3.80–5.10)
RDW: 12.4 % (ref 11.0–15.0)
Total Lymphocyte: 27.2 %
WBC: 7.8 10*3/uL (ref 3.8–10.8)

## 2021-11-10 LAB — COMPLETE METABOLIC PANEL WITH GFR
AG Ratio: 1.6 (calc) (ref 1.0–2.5)
ALT: 5 U/L — ABNORMAL LOW (ref 6–29)
AST: 13 U/L (ref 10–35)
Albumin: 4 g/dL (ref 3.6–5.1)
Alkaline phosphatase (APISO): 55 U/L (ref 37–153)
BUN/Creatinine Ratio: 17 (calc) (ref 6–22)
BUN: 17 mg/dL (ref 7–25)
CO2: 28 mmol/L (ref 20–32)
Calcium: 10.3 mg/dL (ref 8.6–10.4)
Chloride: 106 mmol/L (ref 98–110)
Creat: 1.03 mg/dL — ABNORMAL HIGH (ref 0.60–1.00)
Globulin: 2.5 g/dL (calc) (ref 1.9–3.7)
Glucose, Bld: 86 mg/dL (ref 65–99)
Potassium: 4.5 mmol/L (ref 3.5–5.3)
Sodium: 141 mmol/L (ref 135–146)
Total Bilirubin: 0.4 mg/dL (ref 0.2–1.2)
Total Protein: 6.5 g/dL (ref 6.1–8.1)
eGFR: 58 mL/min/{1.73_m2} — ABNORMAL LOW (ref >=60)

## 2021-11-10 LAB — SEDIMENTATION RATE: Sed Rate: 17 mm/h (ref 0–30)

## 2021-11-10 NOTE — Progress Notes (Signed)
Kidney function numbers are good much improved GFR 58 compared to 21 in June and almost in normal range. Her sedimentation rate test is also normal now so inflammation appears controlled. I recommend continuing same medication for now.

## 2021-11-11 NOTE — Progress Notes (Signed)
Mailed paper copy to patient.

## 2021-11-15 ENCOUNTER — Telehealth: Payer: Self-pay | Admitting: *Deleted

## 2021-11-15 NOTE — Telephone Encounter (Signed)
Patient returned call to the office and advised kidney function numbers are good much improved GFR 58 compared to 21 in June and almost in normal range. Her sedimentation rate test is also normal now so inflammation appears controlled. Dr. Benjamine Mola recommends continuing same medication for now.

## 2021-11-15 NOTE — Telephone Encounter (Signed)
Patient left a message requesting a return call regarding her labs results. Attempted to contact the patient and left message to advise patient we reached out to her several times and were unable to leave a message. Advised patient we have mailed a letter with her results. Advised patient to call the office if she has any further questions or concerns.

## 2021-11-17 ENCOUNTER — Other Ambulatory Visit: Payer: Self-pay | Admitting: Internal Medicine

## 2021-11-17 DIAGNOSIS — N135 Crossing vessel and stricture of ureter without hydronephrosis: Secondary | ICD-10-CM

## 2021-11-17 NOTE — Telephone Encounter (Signed)
Next Visit: 02/07/2022  Last Visit: 11/09/2021  Last Fill: 05/02/2021  DX: Retroperitoneal fibrosis  Current Dose per office note 11/09/2021: Imuran 100mg  by mouth daily  Labs: 11/09/2021 Kidney function numbers are good much improved GFR 58 compared to 21 in June and almost in normal range. Her sedimentation rate test is also normal now so inflammation appears controlled. I recommend continuing same medication for now.  Okay to refill Imuran?

## 2021-11-22 ENCOUNTER — Telehealth: Payer: Self-pay

## 2021-11-22 NOTE — Telephone Encounter (Signed)
Left message for patient to call us back for Dr. Marveen Reeks message:  Please contact Ms. Durflinger with an additional message:  Her kidney function is improved now so she should be fine to resume her valsartan for blood pressure control.  Her kidney stone was made of mostly calcium oxalate, these can be more common with large amounts of soda consumption.

## 2021-12-04 ENCOUNTER — Other Ambulatory Visit: Payer: Self-pay | Admitting: Internal Medicine

## 2021-12-04 DIAGNOSIS — K682 Retroperitoneal fibrosis: Secondary | ICD-10-CM

## 2021-12-05 ENCOUNTER — Telehealth: Payer: Self-pay | Admitting: Internal Medicine

## 2021-12-05 NOTE — Telephone Encounter (Signed)
Left message with patient letting her know that the prescription had been sent.

## 2021-12-05 NOTE — Telephone Encounter (Signed)
Patient called to confirm we received the refill request for Prednisone 2.5 mg tablet from [facility].  Patient states she is out of medication and requesting it be sent to the pharmacy today 12/05/21.

## 2021-12-05 NOTE — Telephone Encounter (Signed)
Next Visit: 02/07/2022  Last Visit: 11/09/2021  Last Fill: 05/02/2021  Dx:  Retroperitoneal fibrosis   Current Dose per office note on 11/09/2021: prednisone 2.5 mg daily  Okay to refill prednisone?

## 2021-12-07 ENCOUNTER — Telehealth: Payer: Self-pay

## 2021-12-07 ENCOUNTER — Other Ambulatory Visit: Payer: Self-pay | Admitting: Obstetrics & Gynecology

## 2021-12-07 DIAGNOSIS — Z1231 Encounter for screening mammogram for malignant neoplasm of breast: Secondary | ICD-10-CM

## 2021-12-07 NOTE — Telephone Encounter (Signed)
Patient called inquiring whether or not she can restart on her blood pressure medication. Patient states her blood pressure is elevated and she is having headaches.  Called patient back and advised patient per Dr. Marveen Reeks message:  Her kidney function is improved now so she should be fine to resume her valsartan for blood pressure control.  Her kidney stone was made of mostly calcium oxalate, these can be more common with large amounts of soda consumption.

## 2022-01-10 ENCOUNTER — Ambulatory Visit
Admission: RE | Admit: 2022-01-10 | Discharge: 2022-01-10 | Disposition: A | Discharge status: Home / Self Care | Payer: Medicare Other | Source: Ambulatory Visit | Attending: Obstetrics & Gynecology | Admitting: Obstetrics & Gynecology

## 2022-01-10 DIAGNOSIS — Z1231 Encounter for screening mammogram for malignant neoplasm of breast: Secondary | ICD-10-CM

## 2022-01-18 ENCOUNTER — Ambulatory Visit: Payer: Medicare Other | Admitting: Urology

## 2022-01-23 ENCOUNTER — Ambulatory Visit: Payer: Medicare Other | Admitting: Urology

## 2022-01-23 ENCOUNTER — Encounter: Payer: Self-pay | Admitting: Urology

## 2022-01-23 ENCOUNTER — Ambulatory Visit (HOSPITAL_COMMUNITY)
Admission: RE | Admit: 2022-01-23 | Discharge: 2022-01-23 | Disposition: A | Discharge status: Home / Self Care | Payer: Medicare Other | Source: Ambulatory Visit | Attending: Urology | Admitting: Urology

## 2022-01-23 VITALS — BP 127/74 | HR 54

## 2022-01-23 DIAGNOSIS — K682 Retroperitoneal fibrosis: Secondary | ICD-10-CM | POA: Diagnosis not present

## 2022-01-23 DIAGNOSIS — N200 Calculus of kidney: Secondary | ICD-10-CM | POA: Diagnosis present

## 2022-01-23 LAB — MICROSCOPIC EXAMINATION

## 2022-01-23 LAB — URINALYSIS, ROUTINE W REFLEX MICROSCOPIC
Bilirubin, UA: NEGATIVE
Glucose, UA: NEGATIVE
Ketones, UA: NEGATIVE
Nitrite, UA: NEGATIVE
Protein,UA: NEGATIVE
Specific Gravity, UA: 1.02 (ref 1.005–1.030)
Urobilinogen, Ur: 0.2 mg/dL (ref 0.2–1.0)
pH, UA: 5.5 (ref 5.0–7.5)

## 2022-01-23 NOTE — Patient Instructions (Signed)

## 2022-01-23 NOTE — Progress Notes (Signed)
01/23/2022 2:53 PM   Autumn Duran Sep 22, 1950 [id number]  Referring provider: [name], MD [place],  [place] [address]  [place] nephrolithiasis   HPI: [name] is a 71yo here for followup for nephrolithiasis and hydronephrosis. Renal US today shows stable bilateral hydronephrosis. She has a 31m left renal calculus. No flank pain. No significant LUTS.    PMH: Past Medical History:  Diagnosis Date   Bronchitis, chronic (HCC)    Cancer (HCC)    Chronic back pain    Chronic kidney disease    kidney stone s/p stent placement ( removed)   Complication of anesthesia    Compression fracture of L3 lumbar vertebra    Diverticulosis    GERD (gastroesophageal reflux disease)    Hemorrhoids    Hiatal hernia    History of kidney stones    Hx of adenomatous colonic polyps    Hyperlipidemia    Hypertension    Lumbar radicular pain    Osteoporosis 12/10/2017   PONV (postoperative nausea and vomiting)    Sore throat    T7 vertebral fracture ([facility]     Surgical History: Past Surgical History:  Procedure Laterality Date   BACK SURGERY     BONE MARROW ASPIRATION Left 02/2014   BONE MARROW BIOPSY Left 02/2014   CHOLECYSTECTOMY     COLONOSCOPY  12/18/2011   Procedure: COLONOSCOPY;  Surgeon: DLafayette Dragon MD;  [facility]: [facility];  Service: Endoscopy;  Laterality: N/A;   COLONOSCOPY WITH PROPOFOL N/A 08/03/2017   Procedure: COLONOSCOPY WITH PROPOFOL;  Surgeon: RRogene Houston MD;  [facility];  Service: Endoscopy;  Laterality: N/A;   CYSTOSCOPY W/ URETERAL STENT PLACEMENT Bilateral 08/18/2021   Procedure: CYSTOSCOPY WITH  BILATERAL RETROGRADE PYELOGRAM BILATERAL /Wyvonnia DuskySTENT PLACEMENT;  Surgeon: MCleon Gustin MD;  Location: [facility];  Service: Urology;  Laterality: Bilateral;   CYSTOSCOPY WITH RETROGRADE PYELOGRAM, URETEROSCOPY AND STENT PLACEMENT Bilateral 09/01/2021   Procedure: CYSTOSCOPY WITH  bilateral RETROGRADE PYELOGRAM, URETEROSCOPY AND   bilateral STENT exchange;  Surgeon: MCleon Gustin MD;  Location: [facility];  Service: Urology;  Laterality: Bilateral;   CYSTOSCOPY/RETROGRADE/URETEROSCOPY/STONE EXTRACTION WITH BASKET Bilateral 04/18/2018   Procedure: CYSTOSCOPY/BILATERAL RETROGRADE/URETEROSCOPY/STONE EXTRACTION WITH BASKET WITH RIGHT STENT PLACEMENT;  Surgeon: MCleon Gustin MD;  [facility]: [facility];  Service: Urology;  Laterality: Bilateral;  1 HR   ESOPHAGEAL DILATION N/A 08/03/2017   Procedure: ESOPHAGEAL DILATION;  Surgeon: RRogene Houston MD;  [facility];  Service: Endoscopy;  Laterality: N/A;   ESOPHAGOGASTRODUODENOSCOPY  12/18/2011   Procedure: ESOPHAGOGASTRODUODENOSCOPY (EGD);  Surgeon: DLafayette Dragon MD;  [facility];  Service: Endoscopy;  Laterality: N/A;   ESOPHAGOGASTRODUODENOSCOPY (EGD) WITH PROPOFOL N/A 08/03/2017   Procedure: ESOPHAGOGASTRODUODENOSCOPY (EGD) WITH PROPOFOL;  Surgeon: RRogene Houston MD;  [facility];  Service: Endoscopy;  Laterality: N/A;  pt knows to arrive at [place] 20/16/0109  Procedure: HOLMIUM LASER APPLICATION;  Surgeon: MCleon Gustin MD;  [facility];  Service: Urology;  Laterality: Right;   HOLMIUM LASER APPLICATION Bilateral 73/03/3555  Procedure: HOLMIUM LASER APPLICATION;  Surgeon: MCleon Gustin MD;  [facility];  Service: Urology;  Laterality: Bilateral;   LUMBAR LAMINECTOMY FOR EPIDURAL ABSCESS Left 09/22/2013   Procedure: LUMBAR LAMINECTOMY FOR EPIDURAL ABSCESS LEFT LUMBAR FIVE-SACRAL ONE;  Surgeon: HCharlie Pitter MD;  [place] [place];  Service: Neurosurgery;  Laterality:  Left;   RADIOLOGY WITH ANESTHESIA N/A 08/13/2014   Procedure: RADIOLOGY WITH ANESTHESIA ;  Surgeon: Medication Radiologist, MD;  Location: [place] [facility];  Service: Radiology;  Laterality: N/A;   URETERAL STENT PLACEMENT     VERTEBROPLASTY N/A 07/14/2013   Procedure: VERTEBROPLASTY WITH LUMBAR THREE  BIOPSY;  Surgeon: Charlie Pitter, MD;  Location: [place] [facility];  Service: Neurosurgery;  Laterality: N/A;  VERTEBROPLASTY WITH LUMBAR THREE BIOPSY    Home Medications:  Allergies as of 01/23/2022       Reactions   Chlorhexidine Gluconate Itching   Pt c/o itching after abd wipes.    Morphine And Related Nausea And Vomiting   Headache   Tetracycline Nausea Only        Medication List        Accurate as of January 23, 2022  2:53 PM. If you have any questions, ask your nurse or doctor.          azaTHIOprine 50 MG tablet Commonly known as: IMURAN Take 2 tablets (100 mg total) by mouth daily at 12 noon.   Azelastine HCl 0.15 % Soln Place 1 spray into the nose daily as needed (allergies.).   HYDROcodone-acetaminophen 5-325 MG tablet Commonly known as: NORCO/VICODIN Take 1 tablet by mouth every 6 (six) hours as needed (severe pain.).   hydrocortisone 1 % ointment Apply 1 application topically 2 (two) times daily. What changed:  when to take this reasons to take this   omeprazole 20 MG capsule Commonly known as: PRILOSEC Take 20 mg by mouth daily before breakfast.   ondansetron 4 MG tablet Commonly known as: Zofran Take 1 tablet (4 mg total) by mouth daily as needed for nausea or vomiting.   ondansetron 4 MG tablet Commonly known as: Zofran Take 1 tablet (4 mg total) by mouth daily as needed for nausea or vomiting.   oxybutynin 5 MG tablet Commonly known as: DITROPAN TAKE 1 TABLET BY MOUTH EVERY 8 HOURS AS NEEDED FOR BLADDER SPASM   oxyCODONE 20 mg 12 hr tablet Commonly known as: OXYCONTIN Take 20 mg by mouth in the morning.   oxyCODONE-acetaminophen 5-325 MG tablet Commonly known as: Percocet Take 1 tablet by mouth every 4 (four) hours as needed for severe pain.   predniSONE 5 MG tablet Commonly known as: DELTASONE TAKE 1/2 TABLET BY MOUTH EVERY DAY WITH BREAKFAST   PRISTIQ PO Take 1 capsule by mouth once a week.   THERATEARS OP Place 1 drop into both  eyes 3 (three) times daily as needed (dry/irritated eyes.).   Vitamin D (Ergocalciferol) 1.25 MG (50000 UNIT) Caps capsule Commonly known as: DRISDOL TAKE 1 CAPSULE BY MOUTH ONCE A WEEK        Allergies:  Allergies  Allergen Reactions   Chlorhexidine Gluconate Itching    Pt c/o itching after abd wipes.    Morphine And Related Nausea And Vomiting    Headache   Tetracycline Nausea Only    Family History: Family History  Problem Relation Age of Onset   Breast cancer Sister 29   Breast cancer Other 15       aunt   Hypertension Mother    COPD Mother    COPD Father    Hypertension Sister    Aortic aneurysm Sister    Hypertension Son     Social History:  reports that she has never smoked. She has never used smokeless tobacco. She reports current alcohol use. She reports that she does not use drugs.  ROS:  All other review of systems were reviewed and are negative except what is noted above in HPI  Physical Exam: BP 127/74   Pulse (!) 54   Constitutional:  Alert and oriented, No acute distress. HEENT: [place] AT, moist mucus membranes.  Trachea midline, no masses. Cardiovascular: No clubbing, cyanosis, or edema. Respiratory: Normal respiratory effort, no increased work of breathing. GI: Abdomen is soft, nontender, nondistended, no abdominal masses GU: No CVA tenderness.  Lymph: No cervical or inguinal lymphadenopathy. Skin: No rashes, bruises or suspicious lesions. Neurologic: Grossly intact, no focal deficits, moving all 4 extremities. Psychiatric: Normal mood and affect.  Laboratory Data: Lab Results  Component Value Date   WBC 7.8 11/09/2021   HGB 14.3 11/09/2021   HCT 43.2 11/09/2021   MCV 92.3 11/09/2021   PLT 305 11/09/2021    Lab Results  Component Value Date   CREATININE 1.03 (H) 11/09/2021    No results found for: "PSA"  No results found for: "TESTOSTERONE"  No results found for: "HGBA1C"  Urinalysis    Component Value Date/Time   COLORURINE  YELLOW 08/02/2021 1411   APPEARANCEUR Clear 10/19/2021 1059   LABSPEC 1.015 08/02/2021 1411   PHURINE 5.5 08/02/2021 1411   GLUCOSEU Negative 10/19/2021 1059   HGBUR NEGATIVE 08/02/2021 1411   BILIRUBINUR Negative 10/19/2021 1059   KETONESUR NEGATIVE 08/02/2021 1411   PROTEINUR Negative 10/19/2021 1059   PROTEINUR TRACE (A) 08/02/2021 1411   UROBILINOGEN 0.2 08/13/2014 0850   NITRITE Negative 10/19/2021 1059   NITRITE NEGATIVE 08/02/2021 1411   LEUKOCYTESUR 1+ (A) 10/19/2021 1059   LEUKOCYTESUR 3+ (A) 08/02/2021 1411    Lab Results  Component Value Date   LABMICR See below: 10/19/2021   WBCUA 6-10 (A) 10/19/2021   LABEPIT >10 (A) 10/19/2021   MUCUS Present 09/08/2021   BACTERIA Few (A) 10/19/2021    Pertinent Imaging: Renal US today: images reviewed and discussed with the patient  No results found for this or any previous visit.  Results for orders placed during the hospital encounter of 12/18/13  US Venous Img Lower Bilateral  Narrative CLINICAL DATA:  Leg swelling  EXAM: BILATERAL LOWER EXTREMITY VENOUS DOPPLER ULTRASOUND  TECHNIQUE: Gray-scale sonography with graded compression, as well as color Doppler and duplex ultrasound were performed to evaluate the lower extremity deep venous systems from the level of the common femoral vein and including the common femoral, femoral, profunda femoral, popliteal and calf veins including the posterior tibial, peroneal and gastrocnemius veins when visible. The superficial great saphenous vein was also interrogated. Spectral Doppler was utilized to evaluate flow at rest and with distal augmentation maneuvers in the common femoral, femoral and popliteal veins.  COMPARISON:  None.  FINDINGS: RIGHT LOWER EXTREMITY  Common Femoral Vein: No evidence of thrombus. Normal compressibility, respiratory phasicity and response to augmentation.  Saphenofemoral Junction: No evidence of thrombus. Normal compressibility and flow on  color Doppler imaging.  Profunda Femoral Vein: No evidence of thrombus. Normal compressibility and flow on color Doppler imaging.  Femoral Vein: No evidence of thrombus. Normal compressibility, respiratory phasicity and response to augmentation.  Popliteal Vein: No evidence of thrombus. Normal compressibility, respiratory phasicity and response to augmentation.  Calf Veins: No evidence of thrombus. Normal compressibility and flow on color Doppler imaging.  Superficial Great Saphenous Vein: No evidence of thrombus. Normal compressibility and flow on color Doppler imaging.  Venous Reflux:  None.  Other Findings:  None.  LEFT LOWER EXTREMITY  Common Femoral Vein: No evidence of thrombus. Normal compressibility, respiratory  phasicity and response to augmentation.  Saphenofemoral Junction: No evidence of thrombus. Normal compressibility and flow on color Doppler imaging.  Profunda Femoral Vein: No evidence of thrombus. Normal compressibility and flow on color Doppler imaging.  Femoral Vein: No evidence of thrombus. Normal compressibility, respiratory phasicity and response to augmentation.  Popliteal Vein: No evidence of thrombus. Normal compressibility, respiratory phasicity and response to augmentation.  Calf Veins: Not well visualized.  Superficial Great Saphenous Vein: No evidence of thrombus. Normal compressibility and flow on color Doppler imaging.  Venous Reflux:  None.  Other Findings:  None.  IMPRESSION: No evidence of deep venous thrombosis.   Electronically Signed By: Kathreen Devoid On: 12/18/2013 13:37  No results found for this or any previous visit.  No results found for this or any previous visit.  Results for orders placed during the hospital encounter of 01/23/22  Ultrasound renal complete  Narrative CLINICAL DATA:  Nephrolithiasis.  EXAM: RENAL / URINARY TRACT ULTRASOUND COMPLETE  COMPARISON:  Renal ultrasound October 10, 2021  FINDINGS: Right Kidney:  Renal measurements: 11.3 x 5.7 x 6.2 cm = volume: 211 mL. Persistent mild hydronephrosis, stable.  Left Kidney:  Renal measurements: 11.9 x 5.9 x 4.9 cm = volume: 180 mL. Moderate hydronephrosis is stable. Contains a 10 mm stone.  Bladder:  Poorly evaluated due to lack of distention.  Other:  None.  IMPRESSION: 1. Stable mild right-sided hydronephrosis and moderate left-sided hydronephrosis. 2. 10 mm stone in the left kidney. 3. The bladder is poorly evaluated due to lack of distention.   Electronically Signed By: Dorise Bullion III M.D. On: 01/23/2022 14:07  No valid procedures specified. No results found for this or any previous visit.  Results for orders placed in visit on 08/16/21  CT RENAL STONE STUDY  Narrative CLINICAL DATA:  Flank pain.  Rule out kidney stone.  EXAM: CT ABDOMEN AND PELVIS WITHOUT CONTRAST  TECHNIQUE: Multidetector CT imaging of the abdomen and pelvis was performed following the standard protocol without IV contrast.  RADIATION DOSE REDUCTION: This exam was performed according to the departmental dose-optimization program which includes automated exposure control, adjustment of the mA and/or kV according to patient size and/or use of iterative reconstruction technique.  COMPARISON:  02/26/2019  FINDINGS: Lower chest: Scarring and volume loss within the right middle lobe and lingula is again noted in appears similar to previous exam. Lingular and left lower lobe bronchial wall thickening identified. This is similar to previous exam and likely postinflammatory.  Hepatobiliary: 2.5 cm cyst within left hepatic lobe is unchanged, image 18/2. Gallbladder appears normal. Fusiform dilatation of the common bile duct is again noted measuring up to 1.5 cm. No signs of obstructing stone or mass.  Pancreas: Unremarkable. No pancreatic ductal dilatation or surrounding inflammatory changes.  Spleen: Normal  in size without focal abnormality.  Adrenals/Urinary Tract:  Normal adrenal glands.  Bilateral nephrolithiasis. The largest right kidney stone is in the interpolar collecting system measuring 7 mm, image 64/5. The largest left renal calculi is in the upper pole measuring 4 mm, image 66/5. There is bilateral renal cortical thinning and moderate to severe hydronephrosis. At the right UPJ there are 2 stones identified measuring up to 4 mm, image 61/5. At the left UPJ there are 2 tiny stones which measure up to 3 mm, image 63/5. No stones identified distal to the UPJ, bilaterally. Urinary bladder appears normal.  Stomach/Bowel: Postsurgical change noted at the GE junction. The stomach appears nondistended. The appendix is visualized and  appears normal. No signs of bowel wall thickening, inflammation, or distension.  Vascular/Lymphatic: Aortic atherosclerosis. No signs of abdominopelvic adenopathy  Reproductive: Uterus and bilateral adnexa are unremarkable.  Other: No free fluid or fluid collections. No signs of pneumoperitoneum.  Musculoskeletal: There is a compression fracture involving the L1 vertebral body with loss of 50% of the vertebral body height. Slightly progressive when compared with lumbar spine plain film radiographs from 03/02/2020. Status post vertebroplasty of chronic L3 compression deformity. Anterolisthesis L4 on L5 is again noted. Remote, healed changes discitis/osteomyelitis noted at L2-3.  IMPRESSION: 1. Moderate to severe bilateral hydronephrosis secondary to bilateral UPJ calculi. 2. Bilateral renal calculi. 3. L1 compression fracture with loss of 50% of the vertebral body height. Slightly progressive when compared with lumbar spine plain film radiographs from 02/26/2019. 4. Remote, healed changes discitis/osteomyelitis at L2-3. 5. Stable fusiform dilatation of the common bile duct measuring up to 1.5 cm. 6. Aortic Atherosclerosis  (ICD10-I70.0).   Electronically Signed By: Kerby Moors M.D. On: 08/16/2021 12:52   Assessment & Plan:    1. Nephrolithiasis - - Urinalysis, Routine w reflex microscopic    No follow-ups on file.  [name], MD  [facility] [place]

## 2022-02-06 NOTE — Progress Notes (Unsigned)
Office Visit Note  Patient: Autumn Duran             Date of Birth: 19-Nov-1950           MRN: [id number]             PCP: [name], MD Referring: [name], MD Visit Date: 02/07/2022   Subjective:  No chief complaint on file.   History of Present Illness: Autumn Duran is a 71 y.o. female here for follow up for retroperitoneal fibrosis on AZA 100 mg daily and prednisone 2.5 mg daily. She is limited but generalized arthritis pain when tapering prednisone previously. Recent labs in urology clinic showed normal urinalysis and kidneys appeared stable. ***   Previous HPI 11/09/21 Autumn Duran is a 71 y.o. female here for follow up for retroperitoneal fibrosis on azathioprine 100 mg daily and prednisone 2.5 mg daily. Since our last visit she saw found to have severe hydronephrosis bilaterally and bilateral obstructing nephrolithiasis. Stent placement and stone removal and subsequent ultrasound showed very good improvement in swelling. Her flank pains also resolved after treatment of these stones.   Previous HPI 08/02/2021 Autumn Duran is a 71 y.o. female here for follow up for retroperitoneal fibrosis on azathioprine 100 mg daily. She is doing well overall she had one episode of right flank and lower abdominal pain lasting about 1 week in duration. She also started Diovan 80-12.49m for hypertension with improvement in headaches and blood pressure. She is not requiring and furosemide and no recent edema. She does report some increase in urinary frequency during the day times, with occasional leaking during coughing or sneezing. No pain with urination though. She does not recall exactly when this started.     Previous HPI 05/02/2021 Autumn EBERis a 71y.o. female here for follow up for retroperitoneal fibrosis on azathioprine 100 mg daily. She discontinued prednisone after tapering she reported some increased pain in her hand and bones. This started about 3 weeks after she stopped  the 2.5 mg prednisone. She also noticed a large increase in pedal edema with stopping prednisone that cleared up after 3 doses of lasix prescribed from PCP office. She has some hand pain worst in the 5th fingers and low back pain. Her left leg and knee gave her some trouble also more stiffness and weakness than pain. She resumed taking prednisone 2.5 mg about 2 weeks ago with a large improvement in symptoms again.  She had one episode of right sided abdominal pain starting to the side and moving to the center in lower abdomen then resolved. She took one percocet due to severity of pain. She never saw any frank blood or stone during this.   Previous HPI 01/31/21 Autumn FEESERis a 71y.o. female here for follow up of retroperitoneal fibrosis with hydronephrosis on azathioprine 100 mg PO daily and prednisone 5 mg daily. She tapered prednisone down from 15 mg to 5 mg daily since last visit without noticing specific problems. She had URI symptoms with congestion and drainage treated with amoxicillin. Weight gain with central adiposity increase she reports 16 pounds total. She notices some sensitivity with monitor bumps playing with her grandson. She is scheduled for renal ultrasound on 12/8 for monitoring by Dr. MAlyson Ingles   Previous HPI 10/11/20 Autumn LACAZEis a 71y.o. female here for follow up for retroperitoneal fibrosis on prednisone 15 mg daily and azathioprine 100 mg p.o. daily.  Since our last  visit she became ill with COVID-pneumonia symptoms took the paxlovid treatment for this and respiratory symptoms recovered completely.  She feels overall well recovered from the problem.  The left-sided chest wall and flank pain and left arm pain from her previous fall are also improved further.  She is noticing some swelling that comes and goes in her lower extremities more than before.  Lower abdominal skin rash is improved   No Rheumatology ROS completed.   PMFS History:  Patient Active Problem List    Diagnosis Date Noted   Urinary frequency 08/02/2021   Osteoarthritis of both hands 05/02/2021   COVID-19 10/11/2020   Right-sided back pain 10/11/2020   Left-sided chest wall pain 08/09/2020   High risk medication use 08/09/2020   Varicose veins of leg with swelling, bilateral 07/05/2020   Primary hypercholesterolemia 05/11/2020   Compression fracture of L1 lumbar vertebra ([place]) 05/11/2020   Bilateral nephrolithiasis 05/11/2020   Bilateral hydronephrosis 04/14/2020   Retroperitoneal fibrosis 03/09/2020   Degenerative scoliosis 03/05/2020   Degenerative spondylolisthesis 03/05/2020   Opioid dependence ([place]) 09/18/2019   Elevated blood-pressure reading, without diagnosis of hypertension 01/08/2019   Closed fracture of proximal end of right humerus 06/26/2018   Paraesophageal hernia 12/12/2017   Osteoporosis 12/10/2017   Dysphagia 06/26/2017   Gastroesophageal reflux disease 06/26/2017   Hx of colonic polyps 06/26/2017   Nausea with vomiting 10/12/2014   Acute low back pain 08/13/2014   Epidural abscess 08/13/2014   Hypertension    Chronic back pain    Essential hypertension    Other iron deficiency anemia 04/10/2014   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[place]) 03/23/2014   Right hip pain 12/23/2013   Epidural abscess, L2-L5 11/04/2013   Lumbar compression fracture (HCC) 07/12/2013   Compression fracture 07/12/2013   Benign neoplasm of colon 12/18/2011   Esophageal reflux 12/18/2011   Other dysphagia 12/18/2011    Past Medical History:  Diagnosis Date   Bronchitis, chronic (HCC)    Cancer (HCC)    Chronic back pain    Chronic kidney disease    kidney stone s/p stent placement ( removed)   Complication of anesthesia    Compression fracture of L3 lumbar vertebra    Diverticulosis    GERD (gastroesophageal reflux disease)    Hemorrhoids    Hiatal hernia    History of kidney stones    Hx of adenomatous colonic polyps    Hyperlipidemia    Hypertension    Lumbar radicular pain     Osteoporosis 12/10/2017   PONV (postoperative nausea and vomiting)    Sore throat    T7 vertebral fracture ([facility])     Family History  Problem Relation Age of Onset   Breast cancer Sister 34   Breast cancer Other 25       aunt   Hypertension Mother    COPD Mother    COPD Father    Hypertension Sister    Aortic aneurysm Sister    Hypertension Son    Past Surgical History:  Procedure Laterality Date   BACK SURGERY     BONE MARROW ASPIRATION Left 02/2014   BONE MARROW BIOPSY Left 02/2014   CHOLECYSTECTOMY     COLONOSCOPY  12/18/2011   Procedure: COLONOSCOPY;  Surgeon: Lafayette Dragon, MD;  Location: [facility];  Service: Endoscopy;  Laterality: N/A;   COLONOSCOPY WITH PROPOFOL N/A 08/03/2017   Procedure: COLONOSCOPY WITH PROPOFOL;  Surgeon: Rogene Houston, MD;  [facility];  Service: Endoscopy;  Laterality: N/A;  CYSTOSCOPY W/ URETERAL STENT PLACEMENT Bilateral 08/18/2021   Procedure: CYSTOSCOPY WITH  BILATERAL RETROGRADE PYELOGRAM BILATERAL Wyvonnia Dusky STENT PLACEMENT;  Surgeon: Cleon Gustin, MD;  Location: [facility];  Service: Urology;  Laterality: Bilateral;   CYSTOSCOPY WITH RETROGRADE PYELOGRAM, URETEROSCOPY AND STENT PLACEMENT Bilateral 09/01/2021   Procedure: CYSTOSCOPY WITH  bilateral RETROGRADE PYELOGRAM, URETEROSCOPY AND  bilateral STENT exchange;  Surgeon: Cleon Gustin, MD;  Location: [facility];  Service: Urology;  Laterality: Bilateral;   CYSTOSCOPY/RETROGRADE/URETEROSCOPY/STONE EXTRACTION WITH BASKET Bilateral 04/18/2018   Procedure: CYSTOSCOPY/BILATERAL RETROGRADE/URETEROSCOPY/STONE EXTRACTION WITH BASKET WITH RIGHT STENT PLACEMENT;  Surgeon: Cleon Gustin, MD;  Location: [facility];  Service: Urology;  Laterality: Bilateral;  1 HR   ESOPHAGEAL DILATION N/A 08/03/2017   Procedure: ESOPHAGEAL DILATION;  Surgeon: Rogene Houston, MD;  [facility];  Service: Endoscopy;  Laterality: N/A;   ESOPHAGOGASTRODUODENOSCOPY  12/18/2011   Procedure:  ESOPHAGOGASTRODUODENOSCOPY (EGD);  Surgeon: Lafayette Dragon, MD;  Location: [facility];  Service: Endoscopy;  Laterality: N/A;   ESOPHAGOGASTRODUODENOSCOPY (EGD) WITH PROPOFOL N/A 08/03/2017   Procedure: ESOPHAGOGASTRODUODENOSCOPY (EGD) WITH PROPOFOL;  Surgeon: Rogene Houston, MD;  [facility];  Service: Endoscopy;  Laterality: N/A;  pt knows to arrive at [facility] Right 07/25/4130   Procedure: HOLMIUM LASER APPLICATION;  Surgeon: Cleon Gustin, MD;  Location: [facility];  Service: Urology;  Laterality: Right;   HOLMIUM LASER APPLICATION Bilateral 05/31/100   Procedure: HOLMIUM LASER APPLICATION;  Surgeon: Cleon Gustin, MD;  Location: [facility];  Service: Urology;  Laterality: Bilateral;   LUMBAR LAMINECTOMY FOR EPIDURAL ABSCESS Left 09/22/2013   Procedure: LUMBAR LAMINECTOMY FOR EPIDURAL ABSCESS LEFT LUMBAR FIVE-SACRAL ONE;  Surgeon: Charlie Pitter, MD;  Location: [facility];  Service: Neurosurgery;  Laterality: Left;   RADIOLOGY WITH ANESTHESIA N/A 08/13/2014   Procedure: RADIOLOGY WITH ANESTHESIA ;  Surgeon: Medication Radiologist, MD;  Location: [facility];  Service: Radiology;  Laterality: N/A;   URETERAL STENT PLACEMENT     VERTEBROPLASTY N/A 07/14/2013   Procedure: VERTEBROPLASTY WITH LUMBAR THREE BIOPSY;  Surgeon: Charlie Pitter, MD;  Location: [facility];  Service: Neurosurgery;  Laterality: N/A;  VERTEBROPLASTY WITH LUMBAR THREE BIOPSY   Social History   Social History Narrative   Not on file   There is no immunization history for the selected administration types on file for this patient.   Objective: Vital Signs: There were no vitals taken for this visit.   Physical Exam   Musculoskeletal Exam: ***  CDAI Exam: CDAI Score: -- Patient Global: --; Provider Global: -- Swollen: --; Tender: -- Joint Exam 02/07/2022   No joint exam has been documented for this visit   There is currently no information documented on the  homunculus. Go to the Rheumatology activity and complete the homunculus joint exam.  Investigation: No additional findings.  Imaging: Ultrasound renal complete  Result Date: 01/23/2022 CLINICAL DATA:  Nephrolithiasis. EXAM: RENAL / URINARY TRACT ULTRASOUND COMPLETE COMPARISON:  Renal ultrasound October 10, 2021 FINDINGS: Right Kidney: Renal measurements: 11.3 x 5.7 x 6.2 cm = volume: 211 mL. Persistent mild hydronephrosis, stable. Left Kidney: Renal measurements: 11.9 x 5.9 x 4.9 cm = volume: 180 mL. Moderate hydronephrosis is stable. Contains a 10 mm stone. Bladder: Poorly evaluated due to lack of distention. Other: None. IMPRESSION: 1. Stable mild right-sided hydronephrosis and moderate left-sided hydronephrosis. 2. 10 mm stone in the left kidney. 3. The bladder is poorly  evaluated due to lack of distention. Electronically Signed   By: Dorise Bullion III M.D.   On: 01/23/2022 14:07   MM 3D SCREEN BREAST BILATERAL  Result Date: 01/12/2022 CLINICAL DATA:  Screening. EXAM: DIGITAL SCREENING BILATERAL MAMMOGRAM WITH TOMOSYNTHESIS AND CAD TECHNIQUE: Bilateral screening digital craniocaudal and mediolateral oblique mammograms were obtained. Bilateral screening digital breast tomosynthesis was performed. The images were evaluated with computer-aided detection. COMPARISON:  Previous exam(s). ACR Breast Density Category b: There are scattered areas of fibroglandular density. FINDINGS: There are no findings suspicious for malignancy. IMPRESSION: No mammographic evidence of malignancy. A result letter of this screening mammogram will be mailed directly to the patient. RECOMMENDATION: Screening mammogram in one year. (Code:SM-B-01Y) BI-RADS CATEGORY  1: Negative. Electronically Signed   By: Abelardo Diesel M.D.   On: 01/12/2022 12:12    Recent Labs: Lab Results  Component Value Date   WBC 7.8 11/09/2021   HGB 14.3 11/09/2021   PLT 305 11/09/2021   NA 141 11/09/2021   K 4.5 11/09/2021   CL 106 11/09/2021    CO2 28 11/09/2021   GLUCOSE 86 11/09/2021   BUN 17 11/09/2021   CREATININE 1.03 (H) 11/09/2021   BILITOT 0.4 11/09/2021   ALKPHOS 79 06/12/2019   AST 13 11/09/2021   ALT 5 (L) 11/09/2021   PROT 6.5 11/09/2021   ALBUMIN 3.5 06/12/2019   CALCIUM 10.3 11/09/2021   GFRAA 67 08/09/2020    Speciality Comments: No specialty comments available.  Procedures:  No procedures performed Allergies: Chlorhexidine gluconate, Morphine and related, and Tetracycline   Assessment / Plan:     Visit Diagnoses: No diagnosis found.  ***  Orders: No orders of the defined types were placed in this encounter.  No orders of the defined types were placed in this encounter.    Follow-Up Instructions: No follow-ups on file.   Collier Salina, MD  Note - This record has been created using [employer].  Chart creation errors have been sought, but may not always  have been located. Such creation errors do not reflect on  the standard of medical care.

## 2022-02-07 ENCOUNTER — Ambulatory Visit: Payer: Medicare Other | Attending: Internal Medicine | Admitting: Internal Medicine

## 2022-02-07 ENCOUNTER — Encounter: Payer: Self-pay | Admitting: Internal Medicine

## 2022-02-07 VITALS — BP 103/66 | HR 57 | Resp 17 | Ht 61.0 in | Wt 155.0 lb

## 2022-02-07 DIAGNOSIS — K682 Retroperitoneal fibrosis: Secondary | ICD-10-CM | POA: Diagnosis not present

## 2022-02-07 DIAGNOSIS — N1330 Unspecified hydronephrosis: Secondary | ICD-10-CM | POA: Diagnosis not present

## 2022-02-07 DIAGNOSIS — N200 Calculus of kidney: Secondary | ICD-10-CM

## 2022-02-22 ENCOUNTER — Encounter: Payer: Self-pay | Admitting: *Deleted

## 2022-03-02 ENCOUNTER — Telehealth: Payer: Self-pay | Admitting: *Deleted

## 2022-03-02 ENCOUNTER — Encounter: Payer: Self-pay | Admitting: *Deleted

## 2022-03-02 NOTE — Telephone Encounter (Signed)
-----  Message from Jim Desanctis sent at 03/02/2022 11:22 AM EST ----- Regarding: RE: Follow up Attempted to contact patient to schedule a follow up in 3 months. Unable to leave message, voicemail full.   ----- Message ----- From: Carole Binning, LPN Sent: 10/04/8674   7:58 AM EST To: [facility] Subject: FW: Follow up                                  Please schedule patient a follow up visit. Thanks! ----- Message ----- From: Collier Salina, MD Sent: 02/28/2022   5:00 PM EST To: [facility] Subject: Follow up                                      Please contact Ms. Ruehl about scheduling a follow up in 3 months.  FYI- I spoke with Ms. Mabee about her lab results these show normalization of kidney function with eGFR of 59. Based on this she can decrease azathioprine to 50 mg daily and.

## 2022-03-02 NOTE — Telephone Encounter (Signed)
My chart message sent to patient. Asked patient to call the office to schedule a follow up appointment.

## 2022-04-25 ENCOUNTER — Encounter (HOSPITAL_COMMUNITY): Payer: Self-pay | Admitting: Hematology

## 2022-05-09 ENCOUNTER — Encounter (HOSPITAL_COMMUNITY): Payer: Self-pay | Admitting: Hematology

## 2022-05-15 NOTE — Progress Notes (Signed)
Office Visit Note  Patient: Autumn Duran             Date of Birth: 09-14-1950           MRN: [id number]             PCP: [name], MD Referring: [name], MD Visit Date: 05/16/2022   Subjective:  No chief complaint on file.   History of Present Illness: Autumn Duran is a 72 y.o. female here for follow up for retroperitoneal fibrosis on azathioprine 50 mg daily and prednisone 2.5 mg daily.  Since her last visit she did not notice any increase in abdominal or flank pain or urinary symptoms.  Has noticed some increase in joint pain and swelling at the right fifth PIP joint.  She takes ibuprofen very occasionally which helps the symptoms takes Tylenol sometimes it is not very helpful.  She did get sick with a sinus infection treated with a short course of Levaquin with good resolution.  Currently having congestion and drainage with nonproductive cough again related to seasonal allergies.  She is scheduled for repeat kidney ultrasound around the end of April with May follow-up in urology clinic.  Previous HPI 02/07/22 Autumn Duran is a 72 y.o. female here for follow up for retroperitoneal fibrosis on AZA 100 mg daily and prednisone 2.5 mg daily. She is limited but generalized arthritis pain when tapering prednisone previously. She had some intermittent right lower quadrant pain but none on most days. Recent labs in urology clinic showed normal urinalysis and kidneys appeared with chronic hydronephrosis change but stable on ultrasound appearance. She had recent labs checked with her PCP office a few days ago.   Previous HPI 11/09/21 Autumn Duran is a 72 y.o. female here for follow up for retroperitoneal fibrosis on azathioprine 100 mg daily and prednisone 2.5 mg daily. Since our last visit she saw found to have severe hydronephrosis bilaterally and bilateral obstructing nephrolithiasis. Stent placement and stone removal and subsequent ultrasound showed very good improvement in  swelling. Her flank pains also resolved after treatment of these stones.   Previous HPI 08/02/2021 Autumn Duran is a 72 y.o. female here for follow up for retroperitoneal fibrosis on azathioprine 100 mg daily. She is doing well overall she had one episode of right flank and lower abdominal pain lasting about 1 week in duration. She also started Diovan 80-12.5mg  for hypertension with improvement in headaches and blood pressure. She is not requiring and furosemide and no recent edema. She does report some increase in urinary frequency during the day times, with occasional leaking during coughing or sneezing. No pain with urination though. She does not recall exactly when this started.     Previous HPI 05/02/2021 Autumn Duran is a 72 y.o. female here for follow up for retroperitoneal fibrosis on azathioprine 100 mg daily. She discontinued prednisone after tapering she reported some increased pain in her hand and bones. This started about 3 weeks after she stopped the 2.5 mg prednisone. She also noticed a large increase in pedal edema with stopping prednisone that cleared up after 3 doses of lasix prescribed from PCP office. She has some hand pain worst in the 5th fingers and low back pain. Her left leg and knee gave her some trouble also more stiffness and weakness than pain. She resumed taking prednisone 2.5 mg about 2 weeks ago with a large improvement in symptoms again.  She had one episode of right sided  abdominal pain starting to the side and moving to the center in lower abdomen then resolved. She took one percocet due to severity of pain. She never saw any frank blood or stone during this.   Previous HPI 01/31/21 Autumn Duran is a 72 y.o. female here for follow up of retroperitoneal fibrosis with hydronephrosis on azathioprine 100 mg PO daily and prednisone 5 mg daily. She tapered prednisone down from 15 mg to 5 mg daily since last visit without noticing specific problems. She had URI symptoms  with congestion and drainage treated with amoxicillin. Weight gain with central adiposity increase she reports 16 pounds total. She notices some sensitivity with monitor bumps playing with her grandson. She is scheduled for renal ultrasound on 12/8 for monitoring by Dr. Alyson Ingles.   Previous HPI 10/11/20 Autumn Duran is a 72 y.o. female here for follow up for retroperitoneal fibrosis on prednisone 15 mg daily and azathioprine 100 mg p.o. daily.  Since our last visit she became ill with COVID-pneumonia symptoms took the paxlovid treatment for this and respiratory symptoms recovered completely.  She feels overall well recovered from the problem.  The left-sided chest wall and flank pain and left arm pain from her previous fall are also improved further.  She is noticing some swelling that comes and goes in her lower extremities more than before.  Lower abdominal skin rash is improved   Review of Systems  Constitutional:  Positive for fatigue.  HENT:  Negative for mouth sores and mouth dryness.   Eyes:  Positive for dryness.  Respiratory:  Negative for shortness of breath.   Cardiovascular:  Negative for chest pain and palpitations.  Gastrointestinal:  Positive for blood in stool and constipation. Negative for diarrhea.  Endocrine: Negative for increased urination.  Genitourinary:  Negative for involuntary urination.  Musculoskeletal:  Positive for joint swelling. Negative for joint pain, gait problem, joint pain, myalgias, muscle weakness, morning stiffness, muscle tenderness and myalgias.  Skin:  Negative for color change, rash, hair loss and sensitivity to sunlight.  Allergic/Immunologic: Negative for susceptible to infections.  Neurological:  Negative for dizziness and headaches.  Hematological:  Negative for swollen glands.  Psychiatric/Behavioral:  Positive for sleep disturbance. Negative for depressed mood. The patient is not nervous/anxious.     PMFS History:  Patient Active Problem  List   Diagnosis Date Noted   Urinary frequency 08/02/2021   Osteoarthritis of both hands 05/02/2021   COVID-19 10/11/2020   Right-sided back pain 10/11/2020   Left-sided chest wall pain 08/09/2020   High risk medication use 08/09/2020   Varicose veins of leg with swelling, bilateral 07/05/2020   Primary hypercholesterolemia 05/11/2020   Compression fracture of L1 lumbar vertebra 05/11/2020   Bilateral nephrolithiasis 05/11/2020   Bilateral hydronephrosis 04/14/2020   Retroperitoneal fibrosis 03/09/2020   Degenerative scoliosis 03/05/2020   Degenerative spondylolisthesis 03/05/2020   Opioid dependence 09/18/2019   Elevated blood-pressure reading, without diagnosis of hypertension 01/08/2019   Closed fracture of proximal end of right humerus 06/26/2018   Paraesophageal hernia 12/12/2017   Osteoporosis 12/10/2017   Dysphagia 06/26/2017   Gastroesophageal reflux disease 06/26/2017   Hx of colonic polyps 06/26/2017   Nausea with vomiting 10/12/2014   Acute low back pain 08/13/2014   Epidural abscess 08/13/2014   Hypertension    Chronic back pain    Essential hypertension    Other iron deficiency anemia 04/10/2014   Plasmacytoma of bone 03/23/2014   Right hip pain 12/23/2013   Epidural abscess, L2-L5 11/04/2013  Lumbar compression fracture 07/12/2013   Compression fracture 07/12/2013   Benign neoplasm of colon 12/18/2011   Esophageal reflux 12/18/2011   Other dysphagia 12/18/2011    Past Medical History:  Diagnosis Date   Bronchitis, chronic (HCC)    Cancer (HCC)    Chronic back pain    Chronic kidney disease    kidney stone s/p stent placement ( removed)   Complication of anesthesia    Compression fracture of L3 lumbar vertebra    Diverticulosis    GERD (gastroesophageal reflux disease)    Hemorrhoids    Hiatal hernia    History of kidney stones    Hx of adenomatous colonic polyps    Hyperlipidemia    Hypertension    Lumbar radicular pain    Osteoporosis  12/10/2017   PONV (postoperative nausea and vomiting)    Sore throat    T7 vertebral fracture ([place])     Family History  Problem Relation Age of Onset   Breast cancer Sister 86   Breast cancer Other 45       aunt   Hypertension Mother    COPD Mother    COPD Father    Hypertension Sister    Aortic aneurysm Sister    Hypertension Son    Past Surgical History:  Procedure Laterality Date   BACK SURGERY     BONE MARROW ASPIRATION Left 02/2014   BONE MARROW BIOPSY Left 02/2014   CHOLECYSTECTOMY     COLONOSCOPY  12/18/2011   Procedure: COLONOSCOPY;  Surgeon: Lafayette Dragon, MD;  Location: [facility];  Service: Endoscopy;  Laterality: N/A;   COLONOSCOPY WITH PROPOFOL N/A 08/03/2017   Procedure: COLONOSCOPY WITH PROPOFOL;  Surgeon: Rogene Houston, MD;  Location: [facility];  Service: Endoscopy;  Laterality: N/A;   CYSTOSCOPY W/ URETERAL STENT PLACEMENT Bilateral 08/18/2021   Procedure: CYSTOSCOPY WITH  BILATERAL RETROGRADE PYELOGRAM BILATERAL Wyvonnia Dusky STENT PLACEMENT;  Surgeon: Cleon Gustin, MD;  Location: [facility];  Service: Urology;  Laterality: Bilateral;   CYSTOSCOPY WITH RETROGRADE PYELOGRAM, URETEROSCOPY AND STENT PLACEMENT Bilateral 09/01/2021   Procedure: CYSTOSCOPY WITH  bilateral RETROGRADE PYELOGRAM, URETEROSCOPY AND  bilateral STENT exchange;  Surgeon: Cleon Gustin, MD;  Location: [facility];  Service: Urology;  Laterality: Bilateral;   CYSTOSCOPY/RETROGRADE/URETEROSCOPY/STONE EXTRACTION WITH BASKET Bilateral 04/18/2018   Procedure: CYSTOSCOPY/BILATERAL RETROGRADE/URETEROSCOPY/STONE EXTRACTION WITH BASKET WITH RIGHT STENT PLACEMENT;  Surgeon: Cleon Gustin, MD;  Location: [facility];  Service: Urology;  Laterality: Bilateral;  1 HR   ESOPHAGEAL DILATION N/A 08/03/2017   Procedure: ESOPHAGEAL DILATION;  Surgeon: Rogene Houston, MD;  [facility];  Service: Endoscopy;  Laterality: N/A;   ESOPHAGOGASTRODUODENOSCOPY  12/18/2011   Procedure:  ESOPHAGOGASTRODUODENOSCOPY (EGD);  Surgeon: Lafayette Dragon, MD;  Location: [facility];  Service: Endoscopy;  Laterality: N/A;   ESOPHAGOGASTRODUODENOSCOPY (EGD) WITH PROPOFOL N/A 08/03/2017   Procedure: ESOPHAGOGASTRODUODENOSCOPY (EGD) WITH PROPOFOL;  Surgeon: Rogene Houston, MD;  [facility];  Service: Endoscopy;  Laterality: N/A;  pt knows to arrive at [place] Right [id number]   Procedure: HOLMIUM LASER APPLICATION;  Surgeon: Cleon Gustin, MD;  Location: [facility];  Service: Urology;  Laterality: Right;   HOLMIUM LASER APPLICATION Bilateral [id number]   Procedure: HOLMIUM LASER APPLICATION;  Surgeon: Cleon Gustin, MD;  Location: [facility];  Service: Urology;  Laterality: Bilateral;   LUMBAR LAMINECTOMY FOR EPIDURAL ABSCESS Left 09/22/2013   Procedure: LUMBAR LAMINECTOMY FOR EPIDURAL  ABSCESS LEFT LUMBAR FIVE-SACRAL ONE;  Surgeon: Charlie Pitter, MD;  Location: [facility];  Service: Neurosurgery;  Laterality: Left;   RADIOLOGY WITH ANESTHESIA N/A 08/13/2014   Procedure: RADIOLOGY WITH ANESTHESIA ;  Surgeon: Medication Radiologist, MD;  Location: [facility];  Service: Radiology;  Laterality: N/A;   URETERAL STENT PLACEMENT     VERTEBROPLASTY N/A 07/14/2013   Procedure: VERTEBROPLASTY WITH LUMBAR THREE BIOPSY;  Surgeon: Charlie Pitter, MD;  Location: [facility];  Service: Neurosurgery;  Laterality: N/A;  VERTEBROPLASTY WITH LUMBAR THREE BIOPSY   Social History   Social History Narrative   Not on file   There is no immunization history for the selected administration types on file for this patient.   Objective: Vital Signs: BP 104/66 (BP Location: Left Arm, Patient Position: Sitting, Cuff Size: Normal)   Pulse (!) 52   Resp 14   Ht 5' (1.524 m)   Wt 153 lb (69.4 kg)   BMI 29.88 kg/m    Physical Exam Eyes:     Conjunctiva/sclera: Conjunctivae normal.  Cardiovascular:     Rate and Rhythm: Normal rate and regular rhythm.   Pulmonary:     Effort: Pulmonary effort is normal.     Breath sounds: Normal breath sounds.  Abdominal:     General: There is no distension.     Palpations: Abdomen is soft.     Tenderness: There is no abdominal tenderness.  Lymphadenopathy:     Cervical: No cervical adenopathy.  Skin:    General: Skin is warm and dry.     Findings: No rash.  Neurological:     Mental Status: She is alert.  Psychiatric:        Mood and Affect: Mood normal.      Musculoskeletal Exam:  Shoulders full ROM no tenderness or swelling Elbows full ROM no tenderness or swelling Wrists full ROM no tenderness or swelling Fingers right 5th PIP slight swelling and tenderness and reduced extenstion ROM, heberdon's nodes throughout both hands worse on right No paraspinal tenderness to palpation over upper and lower back Knees full ROM no tenderness or swelling   Investigation: No additional findings.  Imaging: No results found.  Recent Labs: Lab Results  Component Value Date   WBC 8.2 05/16/2022   HGB 13.0 05/16/2022   PLT 297 05/16/2022   NA 142 05/16/2022   K 4.0 05/16/2022   CL 106 05/16/2022   CO2 30 05/16/2022   GLUCOSE 97 05/16/2022   BUN 16 05/16/2022   CREATININE 1.15 (H) 05/16/2022   BILITOT 0.4 05/16/2022   ALKPHOS 79 06/12/2019   AST 12 05/16/2022   ALT 5 (L) 05/16/2022   PROT 6.6 05/16/2022   ALBUMIN 3.5 06/12/2019   CALCIUM 9.9 05/16/2022   GFRAA 67 08/09/2020    Speciality Comments: No specialty comments available.  Procedures:  No procedures performed Allergies: Chlorhexidine gluconate, Morphine and related, and Tetracycline   Assessment / Plan:     Visit Diagnoses: Retroperitoneal fibrosis - Plan: predniSONE (DELTASONE) 5 MG tablet, Sedimentation rate, azaTHIOprine (IMURAN) 50 MG tablet  Symptoms appear to be doing well no new episode of flank or abdominal pain and inflammatory numbers have been improved.  Did not tolerate tapering off the prednisone completely well  think this is more related to her back pain issues than the retroperitoneal fibrosis though.  If sed rate and labs look okay today we will try stopping her azathioprine if results also okay at follow-up planned with urology and  repeat renal ultrasound May.  Varicose veins of leg with swelling, bilateral  Leg swelling with mild venous insufficiency and known varicose veins.  Interestingly has had troubles with swelling getting worse off prednisone rather than fluid retention with steroid treatment.  Currently appears good.  High risk medication use - Plan: CBC with Differential/Platelet, COMPLETE METABOLIC PANEL WITH GFR  Checking CBC and CMP for medication monitoring on continued azathioprine and low-dose glucocorticoids.  She has not had any significant interval infections or other complication.  Orders: Orders Placed This Encounter  Procedures   Sedimentation rate   CBC with Differential/Platelet   COMPLETE METABOLIC PANEL WITH GFR   Meds ordered this encounter  Medications   predniSONE (DELTASONE) 5 MG tablet    Sig: TAKE 1/2 TABLET BY MOUTH EVERY DAY WITH BREAKFAST    Dispense:  45 tablet    Refill:  0    This prescription was filled on 09/15/2021. Any refills authorized will be placed on file.   azaTHIOprine (IMURAN) 50 MG tablet    Sig: Take 1 tablet (50 mg total) by mouth daily.    Dispense:  90 tablet    Refill:  0     Follow-Up Instructions: Return in about 3 months (around 08/16/2022) for RPF on GC f/u 24mos.   Collier Salina, MD  Note - This record has been created using [employer].  Chart creation errors have been sought, but may not always  have been located. Such creation errors do not reflect on  the standard of medical care.

## 2022-05-16 ENCOUNTER — Ambulatory Visit: Payer: Medicare HMO | Attending: Internal Medicine | Admitting: Internal Medicine

## 2022-05-16 ENCOUNTER — Encounter: Payer: Self-pay | Admitting: Internal Medicine

## 2022-05-16 VITALS — BP 104/66 | HR 52 | Resp 14 | Ht 60.0 in | Wt 153.0 lb

## 2022-05-16 DIAGNOSIS — G8929 Other chronic pain: Secondary | ICD-10-CM | POA: Diagnosis not present

## 2022-05-16 DIAGNOSIS — Z79899 Other long term (current) drug therapy: Secondary | ICD-10-CM | POA: Diagnosis not present

## 2022-05-16 DIAGNOSIS — K682 Retroperitoneal fibrosis: Secondary | ICD-10-CM | POA: Diagnosis not present

## 2022-05-16 DIAGNOSIS — I83893 Varicose veins of bilateral lower extremities with other complications: Secondary | ICD-10-CM

## 2022-05-16 DIAGNOSIS — M545 Low back pain, unspecified: Secondary | ICD-10-CM | POA: Diagnosis not present

## 2022-05-16 MED ORDER — PREDNISONE 5 MG PO TABS: ORAL_TABLET | ORAL | 0 refills | Status: DC

## 2022-05-17 LAB — COMPLETE METABOLIC PANEL WITH GFR
AG Ratio: 1.4 (calc) (ref 1.0–2.5)
ALT: 5 U/L — ABNORMAL LOW (ref 6–29)
AST: 12 U/L (ref 10–35)
Albumin: 3.8 g/dL (ref 3.6–5.1)
Alkaline phosphatase (APISO): 56 U/L (ref 37–153)
BUN/Creatinine Ratio: 14 (calc) (ref 6–22)
BUN: 16 mg/dL (ref 7–25)
CO2: 30 mmol/L (ref 20–32)
Calcium: 9.9 mg/dL (ref 8.6–10.4)
Chloride: 106 mmol/L (ref 98–110)
Creat: 1.15 mg/dL — ABNORMAL HIGH (ref 0.60–1.00)
Globulin: 2.8 g/dL (calc) (ref 1.9–3.7)
Glucose, Bld: 97 mg/dL (ref 65–99)
Potassium: 4 mmol/L (ref 3.5–5.3)
Sodium: 142 mmol/L (ref 135–146)
Total Bilirubin: 0.4 mg/dL (ref 0.2–1.2)
Total Protein: 6.6 g/dL (ref 6.1–8.1)
eGFR: 51 mL/min/{1.73_m2} — ABNORMAL LOW (ref >=60)

## 2022-05-17 LAB — CBC WITH DIFFERENTIAL/PLATELET
Absolute Monocytes: 451 cells/uL (ref 200–950)
Basophils Absolute: 57 cells/uL (ref 0–200)
Basophils Relative: 0.7 %
Eosinophils Absolute: 213 cells/uL (ref 15–500)
Eosinophils Relative: 2.6 %
HCT: 39.4 % (ref 35.0–45.0)
Hemoglobin: 13 g/dL (ref 11.7–15.5)
Lymphs Abs: 1222 cells/uL (ref 850–3900)
MCH: 30.4 pg (ref 27.0–33.0)
MCHC: 33 g/dL (ref 32.0–36.0)
MCV: 92.1 fL (ref 80.0–100.0)
MPV: 10.7 fL (ref 7.5–12.5)
Monocytes Relative: 5.5 %
Neutro Abs: 6257 cells/uL (ref 1500–7800)
Neutrophils Relative %: 76.3 %
Platelets: 297 10*3/uL (ref 140–400)
RBC: 4.28 10*6/uL (ref 3.80–5.10)
RDW: 11.6 % (ref 11.0–15.0)
Total Lymphocyte: 14.9 %
WBC: 8.2 10*3/uL (ref 3.8–10.8)

## 2022-05-17 LAB — SEDIMENTATION RATE: Sed Rate: 41 mm/h — ABNORMAL HIGH (ref 0–30)

## 2022-05-17 MED ORDER — AZATHIOPRINE 50 MG PO TABS: 50.0000 mg | ORAL_TABLET | Freq: Every day | ORAL | 0 refills | Status: DC

## 2022-05-17 NOTE — Progress Notes (Signed)
Sedimentation rate is increased again up to 41 from 17 last time. Kidney function still looks good GFR of 51 slightly down from 58. Because her numbers are worse than last time I would wait on discontinuing the medication at least until after her upcoming kidney ultrasound test.

## 2022-05-22 ENCOUNTER — Telehealth: Payer: Self-pay

## 2022-05-22 NOTE — Telephone Encounter (Signed)
Patient states that her PCP did labs and they noticed her sedimentation rate had increased from 17 to 41.  She states she is not feeling great lately.  She wants to know if you are concerned and want her to move her apt up before 05/13.  Her imaging is scheduled for 05/07.

## 2022-05-23 NOTE — Telephone Encounter (Signed)
Patient states she does not see oncology.  She has not seen Dr. Delton Coombes since 2016.

## 2022-05-23 NOTE — Telephone Encounter (Signed)
Left voicemail for pt to return call for MD response

## 2022-05-24 NOTE — Telephone Encounter (Signed)
Verbal from Dr. Alyson Ingles to inform patient to follow up with Dr. Benjamine Mola regarding her Retroperitoneal fibrosis.  Oncology had referred patient to Rheumatology.  I apologized to the patient for the misinformation and informed her to keep her scheduled imaging and follow up with Dr. Alyson Ingles.  Patient voiced understanding.

## 2022-06-06 DIAGNOSIS — Z6828 Body mass index (BMI) 28.0-28.9, adult: Secondary | ICD-10-CM | POA: Diagnosis not present

## 2022-06-06 DIAGNOSIS — R69 Illness, unspecified: Secondary | ICD-10-CM | POA: Diagnosis not present

## 2022-06-06 DIAGNOSIS — M961 Postlaminectomy syndrome, not elsewhere classified: Secondary | ICD-10-CM | POA: Diagnosis not present

## 2022-07-04 ENCOUNTER — Ambulatory Visit (HOSPITAL_COMMUNITY)
Admission: RE | Admit: 2022-07-04 | Discharge: 2022-07-04 | Disposition: A | Discharge status: Home / Self Care | Payer: Medicare HMO | Source: Ambulatory Visit | Attending: Urology | Admitting: Urology

## 2022-07-04 DIAGNOSIS — N200 Calculus of kidney: Secondary | ICD-10-CM | POA: Insufficient documentation

## 2022-07-04 DIAGNOSIS — N1330 Unspecified hydronephrosis: Secondary | ICD-10-CM | POA: Diagnosis not present

## 2022-07-06 ENCOUNTER — Encounter (INDEPENDENT_AMBULATORY_CARE_PROVIDER_SITE_OTHER): Payer: Self-pay | Admitting: *Deleted

## 2022-07-10 ENCOUNTER — Ambulatory Visit: Payer: Medicare HMO | Admitting: Urology

## 2022-07-10 ENCOUNTER — Encounter: Payer: Self-pay | Admitting: Urology

## 2022-07-10 VITALS — BP 110/66 | HR 55 | Wt 152.2 lb

## 2022-07-10 DIAGNOSIS — K682 Retroperitoneal fibrosis: Secondary | ICD-10-CM | POA: Diagnosis not present

## 2022-07-10 DIAGNOSIS — N200 Calculus of kidney: Secondary | ICD-10-CM

## 2022-07-10 DIAGNOSIS — N1330 Unspecified hydronephrosis: Secondary | ICD-10-CM | POA: Diagnosis not present

## 2022-07-10 NOTE — Progress Notes (Unsigned)
07/10/2022 3:07 PM   Autumn Duran 28-Jan-1951 [id number]  Referring provider: Ignatius Specking, MD [address] [place],  [place] [address]  Chief Complaint  Patient presents with   renal ultrasound    HPI: Renal US shows worsening bilateral hydronephrosis. She has worsening bilateral flank pain. No worsening LUTS. No hematuria.    PMH: Past Medical History:  Diagnosis Date   Bronchitis, chronic (HCC)    Cancer (HCC)    Chronic back pain    Chronic kidney disease    kidney stone s/p stent placement ( removed)   Complication of anesthesia    Compression fracture of L3 lumbar vertebra    Diverticulosis    GERD (gastroesophageal reflux disease)    Hemorrhoids    Hiatal hernia    History of kidney stones    Hx of adenomatous colonic polyps    Hyperlipidemia    Hypertension    Lumbar radicular pain    Osteoporosis 12/10/2017   PONV (postoperative nausea and vomiting)    Sore throat    T7 vertebral fracture [facility])     Surgical History: Past Surgical History:  Procedure Laterality Date   BACK SURGERY     BONE MARROW ASPIRATION Left 02/2014   BONE MARROW BIOPSY Left 02/2014   CHOLECYSTECTOMY     COLONOSCOPY  12/18/2011   Procedure: COLONOSCOPY;  Surgeon: Hart Carwin, MD;  Location: [facility];  Service: Endoscopy;  Laterality: N/A;   COLONOSCOPY WITH PROPOFOL N/A 08/03/2017   Procedure: COLONOSCOPY WITH PROPOFOL;  Surgeon: Malissa Hippo, MD;  [facility]: [facility];  Service: Endoscopy;  Laterality: N/A;   CYSTOSCOPY W/ URETERAL STENT PLACEMENT Bilateral 08/18/2021   Procedure: CYSTOSCOPY WITH  BILATERAL RETROGRADE PYELOGRAM BILATERAL Otilio Miu STENT PLACEMENT;  Surgeon: Malen Gauze, MD;  Location: [facility];  Service: Urology;  Laterality: Bilateral;   CYSTOSCOPY WITH RETROGRADE PYELOGRAM, URETEROSCOPY AND STENT PLACEMENT Bilateral 09/01/2021   Procedure: CYSTOSCOPY WITH  bilateral RETROGRADE PYELOGRAM, URETEROSCOPY AND  bilateral STENT exchange;  Surgeon: Malen Gauze, MD;  Location: [facility];  Service: Urology;  Laterality: Bilateral;   CYSTOSCOPY/RETROGRADE/URETEROSCOPY/STONE EXTRACTION WITH BASKET Bilateral 04/18/2018   Procedure: CYSTOSCOPY/BILATERAL RETROGRADE/URETEROSCOPY/STONE EXTRACTION WITH BASKET WITH RIGHT STENT PLACEMENT;  Surgeon: Malen Gauze, MD;  Location: [facility];  Service: Urology;  Laterality: Bilateral;  1 HR   ESOPHAGEAL DILATION N/A 08/03/2017   Procedure: ESOPHAGEAL DILATION;  Surgeon: Malissa Hippo, MD;  [facility];  Service: Endoscopy;  Laterality: N/A;   ESOPHAGOGASTRODUODENOSCOPY  12/18/2011   Procedure: ESOPHAGOGASTRODUODENOSCOPY (EGD);  Surgeon: Hart Carwin, MD;  Location: [facility];  Service: Endoscopy;  Laterality: N/A;   ESOPHAGOGASTRODUODENOSCOPY (EGD) WITH PROPOFOL N/A 08/03/2017   Procedure: ESOPHAGOGASTRODUODENOSCOPY (EGD) WITH PROPOFOL;  Surgeon: Malissa Hippo, MD;  [facility];  Service: Endoscopy;  Laterality: N/A;  pt knows to arrive at 6:15   HERNIA REPAIR     HOLMIUM LASER APPLICATION Right 04/18/2018   Procedure: HOLMIUM LASER APPLICATION;  Surgeon: Malen Gauze, MD;  Location: [facility];  Service: Urology;  Laterality: Right;   HOLMIUM LASER APPLICATION Bilateral 09/01/2021   Procedure: HOLMIUM LASER APPLICATION;  Surgeon: Malen Gauze, MD;  Location: [facility];  Service: Urology;  Laterality: Bilateral;   LUMBAR LAMINECTOMY FOR EPIDURAL ABSCESS Left 09/22/2013   Procedure: LUMBAR LAMINECTOMY FOR EPIDURAL ABSCESS LEFT LUMBAR FIVE-SACRAL ONE;  Surgeon: Temple Pacini, MD;  Location: [facility];  Service: Neurosurgery;  Laterality: Left;   RADIOLOGY WITH ANESTHESIA  N/A 08/13/2014   Procedure: RADIOLOGY WITH ANESTHESIA ;  Surgeon: Medication Radiologist, MD;  Location: [facility];  Service: Radiology;  Laterality: N/A;   URETERAL STENT PLACEMENT     VERTEBROPLASTY N/A 07/14/2013   Procedure: VERTEBROPLASTY WITH LUMBAR THREE BIOPSY;  Surgeon: Temple Pacini, MD;  Location:  [facility];  Service: Neurosurgery;  Laterality: N/A;  VERTEBROPLASTY WITH LUMBAR THREE BIOPSY    Home Medications:  Allergies as of 07/10/2022       Reactions   Chlorhexidine Gluconate Itching   Pt c/o itching after abd wipes.    Morphine And Related Nausea And Vomiting   Headache   Tetracycline Nausea Only        Medication List        Accurate as of Jul 10, 2022  3:07 PM. If you have any questions, ask your nurse or doctor.          STOP taking these medications    Azelastine HCl 0.15 % Soln Stopped by: Wilkie Aye, MD   oxybutynin 5 MG tablet Commonly known as: DITROPAN Stopped by: Wilkie Aye, MD   oxyCODONE-acetaminophen 5-325 MG tablet Commonly known as: Percocet Stopped by: Wilkie Aye, MD   PRISTIQ PO Stopped by: Wilkie Aye, MD   [facility] OP Stopped by: Wilkie Aye, MD       TAKE these medications    azaTHIOprine 50 MG tablet Commonly known as: IMURAN Take 1 tablet (50 mg total) by mouth daily.   HYDROcodone-acetaminophen 5-325 MG tablet Commonly known as: NORCO/VICODIN Take 1 tablet by mouth every 6 (six) hours as needed (severe pain.).   hydrocortisone 1 % ointment Apply 1 application topically 2 (two) times daily. What changed:  when to take this reasons to take this   ibuprofen 600 MG tablet Commonly known as: ADVIL Take 1 tablet by mouth 4 (four) times daily as needed.   ibuprofen 200 MG tablet Commonly known as: ADVIL Take by mouth.   omeprazole 20 MG capsule Commonly known as: PRILOSEC Take 20 mg by mouth daily before breakfast.   ondansetron 4 MG tablet Commonly known as: Zofran Take 1 tablet (4 mg total) by mouth daily as needed for nausea or vomiting. What changed: Another medication with the same name was removed. Continue taking this medication, and follow the directions you see here. Changed by: Wilkie Aye, MD   oxyCODONE 20 mg 12 hr tablet Commonly known as: OXYCONTIN Take 20  mg by mouth in the morning.   predniSONE 5 MG tablet Commonly known as: DELTASONE TAKE 1/2 TABLET BY MOUTH EVERY DAY WITH BREAKFAST   valsartan-hydrochlorothiazide 80-12.5 MG tablet Commonly known as: DIOVAN-HCT Take 1 tablet by mouth daily.   Vitamin D (Ergocalciferol) 1.25 MG (50000 UNIT) Caps capsule Commonly known as: DRISDOL TAKE 1 CAPSULE BY MOUTH ONCE A WEEK        Allergies:  Allergies  Allergen Reactions   Chlorhexidine Gluconate Itching    Pt c/o itching after abd wipes.    Morphine And Related Nausea And Vomiting    Headache   Tetracycline Nausea Only    Family History: Family History  Problem Relation Age of Onset   Breast cancer Sister 47   Breast cancer Other 27       aunt   Hypertension Mother    COPD Mother    COPD Father    Hypertension Sister    Aortic aneurysm Sister    Hypertension Son     Social History:  reports that  she has never smoked. She has been exposed to tobacco smoke. She has never used smokeless tobacco. She reports current alcohol use. She reports that she does not use drugs.  ROS: All other review of systems were reviewed and are negative except what is noted above in HPI  Physical Exam: BP 110/66   Pulse (!) 55   Wt 152 lb 3.2 oz (69 kg)   BMI 29.72 kg/m   Constitutional:  Alert and oriented, No acute distress. HEENT: [place] AT, moist mucus membranes.  Trachea midline, no masses. Cardiovascular: No clubbing, cyanosis, or edema. Respiratory: Normal respiratory effort, no increased work of breathing. GI: Abdomen is soft, nontender, nondistended, no abdominal masses GU: No CVA tenderness.  Lymph: No cervical or inguinal lymphadenopathy. Skin: No rashes, bruises or suspicious lesions. Neurologic: Grossly intact, no focal deficits, moving all 4 extremities. Psychiatric: Normal mood and affect.  Laboratory Data: Lab Results  Component Value Date   WBC 8.2 05/16/2022   HGB 13.0 05/16/2022   HCT 39.4 05/16/2022   MCV 92.1  05/16/2022   PLT 297 05/16/2022    Lab Results  Component Value Date   CREATININE 1.15 (H) 05/16/2022    No results found for: "PSA"  No results found for: "TESTOSTERONE"  No results found for: "HGBA1C"  Urinalysis    Component Value Date/Time   COLORURINE YELLOW 08/02/2021 1411   APPEARANCEUR Clear 01/23/2022 1410   LABSPEC 1.015 08/02/2021 1411   PHURINE 5.5 08/02/2021 1411   GLUCOSEU Negative 01/23/2022 1410   HGBUR NEGATIVE 08/02/2021 1411   BILIRUBINUR Negative 01/23/2022 1410   KETONESUR NEGATIVE 08/02/2021 1411   PROTEINUR Negative 01/23/2022 1410   PROTEINUR TRACE (A) 08/02/2021 1411   UROBILINOGEN 0.2 08/13/2014 0850   NITRITE Negative 01/23/2022 1410   NITRITE NEGATIVE 08/02/2021 1411   LEUKOCYTESUR Trace (A) 01/23/2022 1410   LEUKOCYTESUR 3+ (A) 08/02/2021 1411    Lab Results  Component Value Date   LABMICR See below: 01/23/2022   WBCUA 0-5 01/23/2022   LABEPIT 0-10 01/23/2022   MUCUS Present 09/08/2021   BACTERIA Few 01/23/2022    Pertinent Imaging: *** No results found for this or any previous visit.  Results for orders placed during the hospital encounter of 12/18/13  US Venous Img Lower Bilateral  Narrative CLINICAL DATA:  Leg swelling  EXAM: BILATERAL LOWER EXTREMITY VENOUS DOPPLER ULTRASOUND  TECHNIQUE: Gray-scale sonography with graded compression, as well as color Doppler and duplex ultrasound were performed to evaluate the lower extremity deep venous systems from the level of the common femoral vein and including the common femoral, femoral, profunda femoral, popliteal and calf veins including the posterior tibial, peroneal and gastrocnemius veins when visible. The superficial great saphenous vein was also interrogated. Spectral Doppler was utilized to evaluate flow at rest and with distal augmentation maneuvers in the common femoral, femoral and popliteal veins.  COMPARISON:  None.  FINDINGS: RIGHT LOWER EXTREMITY  Common  Femoral Vein: No evidence of thrombus. Normal compressibility, respiratory phasicity and response to augmentation.  Saphenofemoral Junction: No evidence of thrombus. Normal compressibility and flow on color Doppler imaging.  Profunda Femoral Vein: No evidence of thrombus. Normal compressibility and flow on color Doppler imaging.  Femoral Vein: No evidence of thrombus. Normal compressibility, respiratory phasicity and response to augmentation.  Popliteal Vein: No evidence of thrombus. Normal compressibility, respiratory phasicity and response to augmentation.  Calf Veins: No evidence of thrombus. Normal compressibility and flow on color Doppler imaging.  Superficial Great Saphenous Vein: No evidence of thrombus.  Normal compressibility and flow on color Doppler imaging.  Venous Reflux:  None.  Other Findings:  None.  LEFT LOWER EXTREMITY  Common Femoral Vein: No evidence of thrombus. Normal compressibility, respiratory phasicity and response to augmentation.  Saphenofemoral Junction: No evidence of thrombus. Normal compressibility and flow on color Doppler imaging.  Profunda Femoral Vein: No evidence of thrombus. Normal compressibility and flow on color Doppler imaging.  Femoral Vein: No evidence of thrombus. Normal compressibility, respiratory phasicity and response to augmentation.  Popliteal Vein: No evidence of thrombus. Normal compressibility, respiratory phasicity and response to augmentation.  Calf Veins: Not well visualized.  Superficial Great Saphenous Vein: No evidence of thrombus. Normal compressibility and flow on color Doppler imaging.  Venous Reflux:  None.  Other Findings:  None.  IMPRESSION: No evidence of deep venous thrombosis.   Electronically Signed By: Elige Ko On: 12/18/2013 13:37  No results found for this or any previous visit.  No results found for this or any previous visit.  Results for orders placed during the hospital  encounter of 07/04/22  Ultrasound renal complete  Narrative CLINICAL DATA:  Nephrolithiasis.  Known hydronephrosis.  EXAM: RENAL / URINARY TRACT ULTRASOUND COMPLETE  COMPARISON:  Renal ultrasound of amber 09/15/2021  FINDINGS: Right Kidney:  Renal measurements: 9.8 x 5.0 x 5.5 cm = volume: 141 mL. Moderate hydronephrosis today versus mild hydronephrosis previously.  Left Kidney:  Renal measurements: 10.7 x 4.5 x 4.4 cm = volume: 112 mL. Severe hydronephrosis today versus moderate hydronephrosis previously.  Bladder:  The bladder cannot be evaluated due to lack of distention.  Other:  None.  IMPRESSION: 1. Moderate right and severe left hydronephrosis today versus mild right and moderate left hydronephrosis previously. 2. The bladder cannot be evaluated due to lack of distention.   Electronically Signed By: Gerome Sam III M.D. On: 07/04/2022 17:00  No valid procedures specified. No results found for this or any previous visit.  Results for orders placed in visit on 08/16/21  CT RENAL STONE STUDY  Narrative CLINICAL DATA:  Flank pain.  Rule out kidney stone.  EXAM: CT ABDOMEN AND PELVIS WITHOUT CONTRAST  TECHNIQUE: Multidetector CT imaging of the abdomen and pelvis was performed following the standard protocol without IV contrast.  RADIATION DOSE REDUCTION: This exam was performed according to the departmental dose-optimization program which includes automated exposure control, adjustment of the mA and/or kV according to patient size and/or use of iterative reconstruction technique.  COMPARISON:  02/26/2019  FINDINGS: Lower chest: Scarring and volume loss within the right middle lobe and lingula is again noted in appears similar to previous exam. Lingular and left lower lobe bronchial wall thickening identified. This is similar to previous exam and likely postinflammatory.  Hepatobiliary: 2.5 cm cyst within left hepatic lobe is  unchanged, image 18/2. Gallbladder appears normal. Fusiform dilatation of the common bile duct is again noted measuring up to 1.5 cm. No signs of obstructing stone or mass.  Pancreas: Unremarkable. No pancreatic ductal dilatation or surrounding inflammatory changes.  Spleen: Normal in size without focal abnormality.  Adrenals/Urinary Tract:  Normal adrenal glands.  Bilateral nephrolithiasis. The largest right kidney stone is in the interpolar collecting system measuring 7 mm, image 64/5. The largest left renal calculi is in the upper pole measuring 4 mm, image 66/5. There is bilateral renal cortical thinning and moderate to severe hydronephrosis. At the right UPJ there are 2 stones identified measuring up to 4 mm, image 61/5. At the left UPJ there are 2 tiny  stones which measure up to 3 mm, image 63/5. No stones identified distal to the UPJ, bilaterally. Urinary bladder appears normal.  Stomach/Bowel: Postsurgical change noted at the GE junction. The stomach appears nondistended. The appendix is visualized and appears normal. No signs of bowel wall thickening, inflammation, or distension.  Vascular/Lymphatic: Aortic atherosclerosis. No signs of abdominopelvic adenopathy  Reproductive: Uterus and bilateral adnexa are unremarkable.  Other: No free fluid or fluid collections. No signs of pneumoperitoneum.  Musculoskeletal: There is a compression fracture involving the L1 vertebral body with loss of 50% of the vertebral body height. Slightly progressive when compared with lumbar spine plain film radiographs from 03/02/2020. Status post vertebroplasty of chronic L3 compression deformity. Anterolisthesis L4 on L5 is again noted. Remote, healed changes discitis/osteomyelitis noted at L2-3.  IMPRESSION: 1. Moderate to severe bilateral hydronephrosis secondary to bilateral UPJ calculi. 2. Bilateral renal calculi. 3. L1 compression fracture with loss of 50% of the vertebral  body height. Slightly progressive when compared with lumbar spine plain film radiographs from 02/26/2019. 4. Remote, healed changes discitis/osteomyelitis at L2-3. 5. Stable fusiform dilatation of the common bile duct measuring up to 1.5 cm. 6. Aortic Atherosclerosis (ICD10-I70.0).   Electronically Signed By: Signa Kell M.D. On: 08/16/2021 12:52   Assessment & Plan:    1. Nephrolithiasis -CT stone study - Urinalysis, Routine w reflex microscopic  2. Retroperitoneal fibrosis CT stone study   No follow-ups on file.  Wilkie Aye, MD  [facility] [place]

## 2022-07-10 NOTE — Patient Instructions (Signed)

## 2022-07-11 ENCOUNTER — Ambulatory Visit (HOSPITAL_COMMUNITY)
Admission: RE | Admit: 2022-07-11 | Discharge: 2022-07-11 | Disposition: A | Discharge status: Home / Self Care | Payer: Medicare HMO | Source: Ambulatory Visit | Attending: Urology | Admitting: Urology

## 2022-07-11 DIAGNOSIS — N200 Calculus of kidney: Secondary | ICD-10-CM | POA: Insufficient documentation

## 2022-07-11 DIAGNOSIS — K682 Retroperitoneal fibrosis: Secondary | ICD-10-CM | POA: Insufficient documentation

## 2022-07-11 DIAGNOSIS — N1330 Unspecified hydronephrosis: Secondary | ICD-10-CM | POA: Diagnosis not present

## 2022-07-11 LAB — URINALYSIS, ROUTINE W REFLEX MICROSCOPIC
Bilirubin, UA: NEGATIVE
Glucose, UA: NEGATIVE
Ketones, UA: NEGATIVE
Nitrite, UA: NEGATIVE
Protein,UA: NEGATIVE
Specific Gravity, UA: 1.01 (ref 1.005–1.030)
Urobilinogen, Ur: 0.2 mg/dL (ref 0.2–1.0)
pH, UA: 6.5 (ref 5.0–7.5)

## 2022-07-11 LAB — MICROSCOPIC EXAMINATION: Epithelial Cells (non renal): >10 /hpf — AB (ref 0–10)

## 2022-07-13 ENCOUNTER — Encounter: Payer: Self-pay | Admitting: Urology

## 2022-07-13 ENCOUNTER — Telehealth: Payer: Self-pay

## 2022-07-13 NOTE — Telephone Encounter (Signed)
Return call to patient regarding her CT renal stone study. Patient states she received results through my chart, patient is aware that results are seen by patient first through my chart. Patient is made aware that the CT renal stone study will be sent to the Dr. Ronne Binning for review and once he review, someone will reach out with Dr. Ronne Binning recommendation. Patient voiced understanding

## 2022-07-17 DIAGNOSIS — Z7189 Other specified counseling: Secondary | ICD-10-CM | POA: Diagnosis not present

## 2022-07-17 DIAGNOSIS — I10 Essential (primary) hypertension: Secondary | ICD-10-CM | POA: Diagnosis not present

## 2022-07-17 DIAGNOSIS — Z299 Encounter for prophylactic measures, unspecified: Secondary | ICD-10-CM | POA: Diagnosis not present

## 2022-07-17 DIAGNOSIS — Z1331 Encounter for screening for depression: Secondary | ICD-10-CM | POA: Diagnosis not present

## 2022-07-17 DIAGNOSIS — D849 Immunodeficiency, unspecified: Secondary | ICD-10-CM | POA: Diagnosis not present

## 2022-07-17 DIAGNOSIS — N1831 Chronic kidney disease, stage 3a: Secondary | ICD-10-CM | POA: Diagnosis not present

## 2022-07-17 DIAGNOSIS — Z0000 Encounter for general adult medical examination without abnormal findings: Secondary | ICD-10-CM | POA: Diagnosis not present

## 2022-07-17 DIAGNOSIS — I700 Atherosclerosis of aorta: Secondary | ICD-10-CM | POA: Diagnosis not present

## 2022-07-17 DIAGNOSIS — Z1339 Encounter for screening examination for other mental health and behavioral disorders: Secondary | ICD-10-CM | POA: Diagnosis not present

## 2022-07-18 ENCOUNTER — Telehealth: Payer: Self-pay

## 2022-07-18 NOTE — Telephone Encounter (Signed)
Patient called requesting the results of her recent CT performed last week. She advised she could be contacted at [phone].    Thank you

## 2022-07-20 NOTE — Telephone Encounter (Signed)
Patient made aware of results and MD's recommendations via my chart. Patient noted to have read message.

## 2022-08-04 ENCOUNTER — Other Ambulatory Visit: Payer: Self-pay | Admitting: Internal Medicine

## 2022-08-04 DIAGNOSIS — K682 Retroperitoneal fibrosis: Secondary | ICD-10-CM

## 2022-08-04 NOTE — Telephone Encounter (Signed)
Last Fill: 05/16/2022  Next Visit: 08/15/2022  Last Visit: 05/16/2022  Dx: Retroperitoneal fibrosis   Current Dose per office note on 05/16/2022: not mentioned  Okay to refill Prednisone?

## 2022-08-14 NOTE — Progress Notes (Signed)
Office Visit Note  Patient: Autumn Duran             Date of Birth: 27-Mar-1950           MRN: [id number]             PCP: Ignatius Specking, MD Referring: Ignatius Specking, MD Visit Date: 08/15/2022   Subjective:  No chief complaint on file.   History of Present Illness: APIPHANY TROCHEZ is a 72 y.o. female here for follow up for retroperitoneal fibrosis complicated by low back pain and urinary obstruction currently on azathioprine 50 mg daily prednisone 2.5 mg daily.  Since last visit she had repeat ultrasound suggestive for slight worsening of hydronephrosis.  However CT scan looked stable.  She feels significantly worse on the lower dose than she did on 5 mg of prednisone.  She took a slightly increased dose when dealing with some upper respiratory symptoms recently which was more helpful for back pain. She is not noticing any abdominal or flank pain symptoms.   Previous HPI 05/16/22 Autumn Duran is a 72 y.o. female here for follow up for retroperitoneal fibrosis on azathioprine 50 mg daily and prednisone 2.5 mg daily.  Since her last visit she did not notice any increase in abdominal or flank pain or urinary symptoms.  Has noticed some increase in joint pain and swelling at the right fifth PIP joint.  She takes ibuprofen very occasionally which helps the symptoms takes Tylenol sometimes it is not very helpful.  She did get sick with a sinus infection treated with a short course of Levaquin with good resolution.  Currently having congestion and drainage with nonproductive cough again related to seasonal allergies.  She is scheduled for repeat kidney ultrasound around the end of April with May follow-up in urology clinic.   Previous HPI 02/07/22 Autumn Duran is a 72 y.o. female here for follow up for retroperitoneal fibrosis on AZA 100 mg daily and prednisone 2.5 mg daily. She is limited but generalized arthritis pain when tapering prednisone previously. She had some intermittent right lower  quadrant pain but none on most days. Recent labs in urology clinic showed normal urinalysis and kidneys appeared with chronic hydronephrosis change but stable on ultrasound appearance. She had recent labs checked with her PCP office a few days ago.   Previous HPI 11/09/21 Autumn Duran is a 72 y.o. female here for follow up for retroperitoneal fibrosis on azathioprine 100 mg daily and prednisone 2.5 mg daily. Since our last visit she saw found to have severe hydronephrosis bilaterally and bilateral obstructing nephrolithiasis. Stent placement and stone removal and subsequent ultrasound showed very good improvement in swelling. Her flank pains also resolved after treatment of these stones.   Previous HPI 08/02/2021 SABRIAH Duran is a 72 y.o. female here for follow up for retroperitoneal fibrosis on azathioprine 100 mg daily. She is doing well overall she had one episode of right flank and lower abdominal pain lasting about 1 week in duration. She also started Diovan 80-12.5mg  for hypertension with improvement in headaches and blood pressure. She is not requiring and furosemide and no recent edema. She does report some increase in urinary frequency during the day times, with occasional leaking during coughing or sneezing. No pain with urination though. She does not recall exactly when this started.     Previous HPI 05/02/2021 Autumn Duran is a 72 y.o. female here for follow up for retroperitoneal fibrosis on azathioprine 100  mg daily. She discontinued prednisone after tapering she reported some increased pain in her hand and bones. This started about 3 weeks after she stopped the 2.5 mg prednisone. She also noticed a large increase in pedal edema with stopping prednisone that cleared up after 3 doses of lasix prescribed from PCP office. She has some hand pain worst in the 5th fingers and low back pain. Her left leg and knee gave her some trouble also more stiffness and weakness than pain. She resumed  taking prednisone 2.5 mg about 2 weeks ago with a large improvement in symptoms again.  She had one episode of right sided abdominal pain starting to the side and moving to the center in lower abdomen then resolved. She took one percocet due to severity of pain. She never saw any frank blood or stone during this.   Previous HPI 01/31/21 Autumn Duran is a 72 y.o. female here for follow up of retroperitoneal fibrosis with hydronephrosis on azathioprine 100 mg PO daily and prednisone 5 mg daily. She tapered prednisone down from 15 mg to 5 mg daily since last visit without noticing specific problems. She had URI symptoms with congestion and drainage treated with amoxicillin. Weight gain with central adiposity increase she reports 16 pounds total. She notices some sensitivity with monitor bumps playing with her grandson. She is scheduled for renal ultrasound on 12/8 for monitoring by Dr. Ronne Binning.   Previous HPI 10/11/20 Autumn Duran is a 72 y.o. female here for follow up for retroperitoneal fibrosis on prednisone 15 mg daily and azathioprine 100 mg p.o. daily.  Since our last visit she became ill with COVID-pneumonia symptoms took the paxlovid treatment for this and respiratory symptoms recovered completely.  She feels overall well recovered from the problem.  The left-sided chest wall and flank pain and left arm pain from her previous fall are also improved further.  She is noticing some swelling that comes and goes in her lower extremities more than before.  Lower abdominal skin rash is improved   Review of Systems  Constitutional:  Negative for fever.  Gastrointestinal:  Negative for diarrhea.  Genitourinary:  Negative for hematuria.  Musculoskeletal:  Positive for joint pain and joint pain. Negative for gait problem and joint swelling.  Skin:  Negative for rash.    PMFS History:  Patient Active Problem List   Diagnosis Date Noted   Urinary frequency 08/02/2021   Osteoarthritis of both hands  05/02/2021   COVID-19 10/11/2020   Right-sided back pain 10/11/2020   Left-sided chest wall pain 08/09/2020   High risk medication use 08/09/2020   Varicose veins of leg with swelling, bilateral 07/05/2020   Primary hypercholesterolemia 05/11/2020   Compression fracture of L1 lumbar vertebra (HCC) 05/11/2020   Bilateral nephrolithiasis 05/11/2020   Bilateral hydronephrosis 04/14/2020   Retroperitoneal fibrosis 03/09/2020   Degenerative scoliosis 03/05/2020   Degenerative spondylolisthesis 03/05/2020   Opioid dependence (HCC) 09/18/2019   Elevated blood-pressure reading, without diagnosis of hypertension 01/08/2019   Closed fracture of proximal end of right humerus 06/26/2018   Paraesophageal hernia 12/12/2017   Osteoporosis 12/10/2017   Dysphagia 06/26/2017   Gastroesophageal reflux disease 06/26/2017   Hx of colonic polyps 06/26/2017   Nausea with vomiting 10/12/2014   Acute low back pain 08/13/2014   Epidural abscess 08/13/2014   Hypertension    Chronic back pain    Essential hypertension    Other iron deficiency anemia 04/10/2014   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/2014   Right hip pain  12/23/2013   Epidural abscess, L2-L5 11/04/2013   Lumbar compression fracture (HCC) 07/12/2013   Compression fracture 07/12/2013   Benign neoplasm of colon 12/18/2011   Esophageal reflux 12/18/2011   Other dysphagia 12/18/2011    Past Medical History:  Diagnosis Date   Bronchitis, chronic (HCC)    Cancer (HCC)    Chronic back pain    Chronic kidney disease    kidney stone s/p stent placement ( removed)   Complication of anesthesia    Compression fracture of L3 lumbar vertebra    Diverticulosis    GERD (gastroesophageal reflux disease)    Hemorrhoids    Hiatal hernia    History of kidney stones    Hx of adenomatous colonic polyps    Hyperlipidemia    Hypertension    Lumbar radicular pain    Osteoporosis 12/10/2017   PONV (postoperative nausea and vomiting)    Sore throat     T7 vertebral fracture (HCC)     Family History  Problem Relation Age of Onset   Breast cancer Sister 84   Breast cancer Other 28       aunt   Hypertension Mother    COPD Mother    COPD Father    Hypertension Sister    Aortic aneurysm Sister    Hypertension Son    Past Surgical History:  Procedure Laterality Date   BACK SURGERY     BONE MARROW ASPIRATION Left 02/2014   BONE MARROW BIOPSY Left 02/2014   CHOLECYSTECTOMY     COLONOSCOPY  12/18/2011   Procedure: COLONOSCOPY;  Surgeon: Hart Carwin, MD;  Location: [facility];  Service: Endoscopy;  Laterality: N/A;   COLONOSCOPY WITH PROPOFOL N/A 08/03/2017   Procedure: COLONOSCOPY WITH PROPOFOL;  Surgeon: Malissa Hippo, MD;  Location: [facility];  Service: Endoscopy;  Laterality: N/A;   CYSTOSCOPY W/ URETERAL STENT PLACEMENT Bilateral 08/18/2021   Procedure: CYSTOSCOPY WITH  BILATERAL RETROGRADE PYELOGRAM BILATERAL Otilio Miu STENT PLACEMENT;  Surgeon: Malen Gauze, MD;  Location: [facility];  Service: Urology;  Laterality: Bilateral;   CYSTOSCOPY WITH RETROGRADE PYELOGRAM, URETEROSCOPY AND STENT PLACEMENT Bilateral 09/01/2021   Procedure: CYSTOSCOPY WITH  bilateral RETROGRADE PYELOGRAM, URETEROSCOPY AND  bilateral STENT exchange;  Surgeon: Malen Gauze, MD;  Location: [facility];  Service: Urology;  Laterality: Bilateral;   CYSTOSCOPY/RETROGRADE/URETEROSCOPY/STONE EXTRACTION WITH BASKET Bilateral 04/18/2018   Procedure: CYSTOSCOPY/BILATERAL RETROGRADE/URETEROSCOPY/STONE EXTRACTION WITH BASKET WITH RIGHT STENT PLACEMENT;  Surgeon: Malen Gauze, MD;  Location: [facility];  Service: Urology;  Laterality: Bilateral;  1 HR   ESOPHAGEAL DILATION N/A 08/03/2017   Procedure: ESOPHAGEAL DILATION;  Surgeon: Malissa Hippo, MD;  Location: [facility];  Service: Endoscopy;  Laterality: N/A;   ESOPHAGOGASTRODUODENOSCOPY  12/18/2011   Procedure: ESOPHAGOGASTRODUODENOSCOPY (EGD);  Surgeon: Hart Carwin, MD;  Location: [facility];  Service:  Endoscopy;  Laterality: N/A;   ESOPHAGOGASTRODUODENOSCOPY (EGD) WITH PROPOFOL N/A 08/03/2017   Procedure: ESOPHAGOGASTRODUODENOSCOPY (EGD) WITH PROPOFOL;  Surgeon: Malissa Hippo, MD;  Location: [facility];  Service: Endoscopy;  Laterality: N/A;  pt knows to arrive at 6:15   HERNIA REPAIR     HOLMIUM LASER APPLICATION Right 04/18/2018   Procedure: HOLMIUM LASER APPLICATION;  Surgeon: Malen Gauze, MD;  Location: [facility];  Service: Urology;  Laterality: Right;   HOLMIUM LASER APPLICATION Bilateral 09/01/2021   Procedure: HOLMIUM LASER APPLICATION;  Surgeon: Malen Gauze, MD;  Location: [facility];  Service: Urology;  Laterality: Bilateral;   LUMBAR LAMINECTOMY FOR EPIDURAL  ABSCESS Left 09/22/2013   Procedure: LUMBAR LAMINECTOMY FOR EPIDURAL ABSCESS LEFT LUMBAR FIVE-SACRAL ONE;  Surgeon: Temple Pacini, MD;  Location: [facility];  Service: Neurosurgery;  Laterality: Left;   RADIOLOGY WITH ANESTHESIA N/A 08/13/2014   Procedure: RADIOLOGY WITH ANESTHESIA ;  Surgeon: Medication Radiologist, MD;  Location: [facility];  Service: Radiology;  Laterality: N/A;   URETERAL STENT PLACEMENT     VERTEBROPLASTY N/A 07/14/2013   Procedure: VERTEBROPLASTY WITH LUMBAR THREE BIOPSY;  Surgeon: Temple Pacini, MD;  Location: [facility];  Service: Neurosurgery;  Laterality: N/A;  VERTEBROPLASTY WITH LUMBAR THREE BIOPSY   Social History   Social History Narrative   Not on file   There is no immunization history for the selected administration types on file for this patient.   Objective: Vital Signs: BP 92/60 (BP Location: Left Arm, Patient Position: Sitting, Cuff Size: Small)   Pulse 63   Resp 12   Ht 5\' 1"  (1.549 m)   Wt 148 lb (67.1 kg)   BMI 27.96 kg/m    Physical Exam Eyes:     Conjunctiva/sclera: Conjunctivae normal.  Cardiovascular:     Rate and Rhythm: Normal rate and regular rhythm.  Pulmonary:     Effort: Pulmonary effort is normal.     Breath sounds: Normal breath sounds.   Musculoskeletal:     Right lower leg: No edema.     Left lower leg: No edema.  Lymphadenopathy:     Cervical: No cervical adenopathy.  Skin:    General: Skin is warm and dry.     Findings: No rash.  Neurological:     Mental Status: She is alert.  Psychiatric:        Mood and Affect: Mood normal.      Musculoskeletal Exam:  Wrists full ROM no tenderness or swelling Fingers full ROM no tenderness or swelling, Heberden's nodes and first CMC joint squaring bilaterally Midline lumbar spine tenderness and in bilateral low back muscles, no radiation of tender points No tenderness to pressure on lateral hips, normal rotation Knees full ROM no tenderness or swelling Ankles full ROM no tenderness or swelling   Investigation: No additional findings.  Imaging: No results found.  Recent Labs: Lab Results  Component Value Date   WBC 10.2 08/15/2022   HGB 12.1 08/15/2022   PLT 437 (H) 08/15/2022   NA 139 08/15/2022   K 4.6 08/15/2022   CL 102 08/15/2022   CO2 31 08/15/2022   GLUCOSE 97 08/15/2022   BUN 35 (H) 08/15/2022   CREATININE 1.27 (H) 08/15/2022   BILITOT 0.4 08/15/2022   ALKPHOS 79 06/12/2019   AST 14 08/15/2022   ALT 7 08/15/2022   PROT 7.3 08/15/2022   ALBUMIN 3.5 06/12/2019   CALCIUM 10.5 (H) 08/15/2022   GFRAA 67 08/09/2020    Speciality Comments: No specialty comments available.  Procedures:  No procedures performed Allergies: Chlorhexidine gluconate, Morphine and codeine, and Tetracycline   Assessment / Plan:     Visit Diagnoses: Retroperitoneal fibrosis - Plan: Sedimentation rate, predniSONE (DELTASONE) 5 MG tablet, azaTHIOprine (IMURAN) 50 MG tablet  Checking sedimentation rate and metabolic panel for disease activity monitoring.  If these are worse we will recommend increasing azathioprine dose and prednisone dose to double current for suspected return of inflammation.  If same or better can continue on low maintenance dose.  Currently is on  azathioprine 50 mg and prednisone 2.5 mg daily.  High risk medication use - Plan: CBC with Differential/Platelet,  COMPLETE METABOLIC PANEL WITH GFR  Checking CBC and CMP for monitoring on continued use of azathioprine.  She had at least 1 interval upper respiratory infection but not complicated.  Chronic midline low back pain without sciatica  Discussed low back pain is probably unrelated to the inflammatory disease she has degenerative changes complicated by the previous compression fracture of lumbar vertebra.  Recommended that higher dose of prednisone would not be recommended method for managing this.  Continues on low-dose chronic pain medication for this.  Orders: Orders Placed This Encounter  Procedures   Sedimentation rate   CBC with Differential/Platelet   COMPLETE METABOLIC PANEL WITH GFR   Meds ordered this encounter  Medications   predniSONE (DELTASONE) 5 MG tablet    Sig: Take 1 tablet (5 mg total) by mouth daily with breakfast.    Dispense:  30 tablet    Refill:  0   azaTHIOprine (IMURAN) 50 MG tablet    Sig: Take 2 tablets (100 mg total) by mouth daily.    Dispense:  60 tablet    Refill:  0     Follow-Up Instructions: Return in about 3 months (around 11/15/2022) for RF on AZA/GC f/u 3mos.   Fuller Plan, MD  Note - This record has been created using [employer].  Chart creation errors have been sought, but may not always  have been located. Such creation errors do not reflect on  the standard of medical care.

## 2022-08-15 ENCOUNTER — Encounter: Payer: Self-pay | Admitting: Internal Medicine

## 2022-08-15 ENCOUNTER — Ambulatory Visit: Payer: Medicare HMO | Admitting: Internal Medicine

## 2022-08-15 ENCOUNTER — Ambulatory Visit: Payer: Medicare HMO | Attending: Internal Medicine | Admitting: Internal Medicine

## 2022-08-15 VITALS — BP 92/60 | HR 63 | Resp 12 | Ht 61.0 in | Wt 148.0 lb

## 2022-08-15 DIAGNOSIS — G8929 Other chronic pain: Secondary | ICD-10-CM | POA: Diagnosis not present

## 2022-08-15 DIAGNOSIS — Z79899 Other long term (current) drug therapy: Secondary | ICD-10-CM | POA: Diagnosis not present

## 2022-08-15 DIAGNOSIS — K682 Retroperitoneal fibrosis: Secondary | ICD-10-CM

## 2022-08-15 DIAGNOSIS — M545 Low back pain, unspecified: Secondary | ICD-10-CM

## 2022-08-16 LAB — COMPLETE METABOLIC PANEL WITH GFR
AG Ratio: 1.4 (calc) (ref 1.0–2.5)
ALT: 7 U/L (ref 6–29)
AST: 14 U/L (ref 10–35)
Albumin: 4.2 g/dL (ref 3.6–5.1)
Alkaline phosphatase (APISO): 60 U/L (ref 37–153)
BUN/Creatinine Ratio: 28 (calc) — ABNORMAL HIGH (ref 6–22)
BUN: 35 mg/dL — ABNORMAL HIGH (ref 7–25)
CO2: 31 mmol/L (ref 20–32)
Calcium: 10.5 mg/dL — ABNORMAL HIGH (ref 8.6–10.4)
Chloride: 102 mmol/L (ref 98–110)
Creat: 1.27 mg/dL — ABNORMAL HIGH (ref 0.60–1.00)
Globulin: 3.1 g/dL (calc) (ref 1.9–3.7)
Glucose, Bld: 97 mg/dL (ref 65–99)
Potassium: 4.6 mmol/L (ref 3.5–5.3)
Sodium: 139 mmol/L (ref 135–146)
Total Bilirubin: 0.4 mg/dL (ref 0.2–1.2)
Total Protein: 7.3 g/dL (ref 6.1–8.1)
eGFR: 45 mL/min/{1.73_m2} — ABNORMAL LOW (ref >=60)

## 2022-08-16 LAB — CBC WITH DIFFERENTIAL/PLATELET
Absolute Monocytes: 347 cells/uL (ref 200–950)
Basophils Absolute: 41 cells/uL (ref 0–200)
Basophils Relative: 0.4 %
Eosinophils Absolute: 20 cells/uL (ref 15–500)
Eosinophils Relative: 0.2 %
HCT: 36.8 % (ref 35.0–45.0)
Hemoglobin: 12.1 g/dL (ref 11.7–15.5)
Lymphs Abs: 724 cells/uL — ABNORMAL LOW (ref 850–3900)
MCH: 28.5 pg (ref 27.0–33.0)
MCHC: 32.9 g/dL (ref 32.0–36.0)
MCV: 86.6 fL (ref 80.0–100.0)
MPV: 9.9 fL (ref 7.5–12.5)
Monocytes Relative: 3.4 %
Neutro Abs: 9068 cells/uL — ABNORMAL HIGH (ref 1500–7800)
Neutrophils Relative %: 88.9 %
Platelets: 437 10*3/uL — ABNORMAL HIGH (ref 140–400)
RBC: 4.25 10*6/uL (ref 3.80–5.10)
RDW: 13 % (ref 11.0–15.0)
Total Lymphocyte: 7.1 %
WBC: 10.2 10*3/uL (ref 3.8–10.8)

## 2022-08-16 LAB — SEDIMENTATION RATE: Sed Rate: 84 mm/h — ABNORMAL HIGH (ref 0–30)

## 2022-08-16 MED ORDER — AZATHIOPRINE 50 MG PO TABS: 100.0000 mg | ORAL_TABLET | Freq: Every day | ORAL | 0 refills | Status: DC

## 2022-08-16 MED ORDER — PREDNISONE 5 MG PO TABS
5.0000 mg | ORAL_TABLET | Freq: Every day | ORAL | 0 refills | Status: DC
Start: 2022-08-16 — End: 2022-10-03

## 2022-08-16 NOTE — Progress Notes (Signed)
Lab results look significantly worse compared to 3 months ago. Estimated GFR is 45 slightly worse again from 50s. Sedimentation rate increased to 84 this is higher than she has had since before we started seeing her here.  I recommend she increase the azathioprine back up to 100 mg daily and increase prednisone to 5 mg daily. We can recheck her labs (ESR, CBC, CMP) after 1 month to make sure this starts to move in the right direction.

## 2022-08-21 ENCOUNTER — Telehealth: Payer: Self-pay | Admitting: *Deleted

## 2022-08-21 ENCOUNTER — Encounter: Payer: Self-pay | Admitting: Internal Medicine

## 2022-08-21 ENCOUNTER — Encounter: Payer: Self-pay | Admitting: *Deleted

## 2022-08-21 DIAGNOSIS — Z79899 Other long term (current) drug therapy: Secondary | ICD-10-CM

## 2022-08-21 NOTE — Telephone Encounter (Signed)
-----   Message from Fuller Plan, MD sent at 08/16/2022  7:56 AM EDT ----- Lab results look significantly worse compared to 3 months ago. Estimated GFR is 45 slightly worse again from 50s. Sedimentation rate increased to 84 this is higher than she has had since before we started seeing her here.  I recommend she increase the azathioprine back up to 100 mg daily and increase prednisone to 5 mg daily. We can recheck her labs (ESR, CBC, CMP) after 1 month to make sure this starts to move in the right direction.

## 2022-08-21 NOTE — Telephone Encounter (Signed)
Patient sed rate on 08/15/2022 was 84, please advise.

## 2022-08-22 NOTE — Telephone Encounter (Signed)
Patient has been advised of lab results and med changes. Please advise.

## 2022-08-25 NOTE — Telephone Encounter (Signed)
I do not think the sundrop or other drinks are a major factor for the abnormal lab changes. Excessive soda consumption can contribute kidney stones but based on recent imaging that is not the current cause.

## 2022-08-29 DIAGNOSIS — M961 Postlaminectomy syndrome, not elsewhere classified: Secondary | ICD-10-CM | POA: Diagnosis not present

## 2022-08-29 DIAGNOSIS — Z6828 Body mass index (BMI) 28.0-28.9, adult: Secondary | ICD-10-CM | POA: Diagnosis not present

## 2022-08-29 DIAGNOSIS — F1120 Opioid dependence, uncomplicated: Secondary | ICD-10-CM | POA: Diagnosis not present

## 2022-10-03 ENCOUNTER — Other Ambulatory Visit: Payer: Self-pay | Admitting: Internal Medicine

## 2022-10-03 DIAGNOSIS — K682 Retroperitoneal fibrosis: Secondary | ICD-10-CM

## 2022-10-03 NOTE — Telephone Encounter (Signed)
Last Fill: 08/16/2022  Next Visit: 11/21/2022  Last Visit: 08/15/2022  Dx: Retroperitoneal fibrosis   Current Dose per office note on 08/15/2022: prednisone 2.5 mg daily.   Prescription and office note say two different doses. Please change prescription if needed.   Okay to refill Prednisone?

## 2022-10-29 DIAGNOSIS — S42409A Unspecified fracture of lower end of unspecified humerus, initial encounter for closed fracture: Secondary | ICD-10-CM

## 2022-10-29 HISTORY — DX: Unspecified fracture of lower end of unspecified humerus, initial encounter for closed fracture: S42.409A

## 2022-11-07 NOTE — Progress Notes (Unsigned)
Office Visit Note  Patient: Autumn Duran             Date of Birth: 1950/05/19           MRN: [id number]             PCP: Ignatius Specking, MD Referring: Ignatius Specking, MD Visit Date: 11/21/2022   Subjective:  Follow-up (Patient states she broke her elbow a couple of weeks ago. Patient states she does not sleep very well. )   History of Present Illness: Autumn Duran is a 72 y.o. female here for follow up for retroperitoneal fibrosis complicated by low back pain and urinary obstruction currently on azathioprine 100 mg daily prednisone 2.5 mg daily.  We increased medication dose after the last visit due to markedly elevated sedimentation rate although was not having additional focal symptoms.  She had recent event falling with a right elbow fracture this occurred after trying to turn quickly while holding items in both hands for a school function.  Also has a some associated right rib or otherwise chest wall pain.  He is having poor sleep and associated fatigue describes repeat waking throughout the night about once every hour.  Not associated with needing to urinate or pain.  She has not discussed this with her PCP office or had any sleep medicine evaluation.  Previous HPI 08/15/2022 Autumn Duran is a 72 y.o. female here for follow up for retroperitoneal fibrosis complicated by low back pain and urinary obstruction currently on azathioprine 50 mg daily prednisone 2.5 mg daily.  Since last visit she had repeat ultrasound suggestive for slight worsening of hydronephrosis.  However CT scan looked stable.  She feels significantly worse on the lower dose than she did on 5 mg of prednisone.  She took a slightly increased dose when dealing with some upper respiratory symptoms recently which was more helpful for back pain. She is not noticing any abdominal or flank pain symptoms.   Previous HPI 05/16/22 Autumn Duran is a 72 y.o. female here for follow up for retroperitoneal fibrosis on azathioprine  50 mg daily and prednisone 2.5 mg daily.  Since her last visit she did not notice any increase in abdominal or flank pain or urinary symptoms.  Has noticed some increase in joint pain and swelling at the right fifth PIP joint.  She takes ibuprofen very occasionally which helps the symptoms takes Tylenol sometimes it is not very helpful.  She did get sick with a sinus infection treated with a short course of Levaquin with good resolution.  Currently having congestion and drainage with nonproductive cough again related to seasonal allergies.  She is scheduled for repeat kidney ultrasound around the end of April with May follow-up in urology clinic.   Previous HPI 02/07/22 Autumn Duran is a 72 y.o. female here for follow up for retroperitoneal fibrosis on AZA 100 mg daily and prednisone 2.5 mg daily. She is limited but generalized arthritis pain when tapering prednisone previously. She had some intermittent right lower quadrant pain but none on most days. Recent labs in urology clinic showed normal urinalysis and kidneys appeared with chronic hydronephrosis change but stable on ultrasound appearance. She had recent labs checked with her PCP office a few days ago.   Previous HPI 11/09/21 Autumn Duran is a 72 y.o. female here for follow up for retroperitoneal fibrosis on azathioprine 100 mg daily and prednisone 2.5 mg daily. Since our last visit she saw found to  have severe hydronephrosis bilaterally and bilateral obstructing nephrolithiasis. Stent placement and stone removal and subsequent ultrasound showed very good improvement in swelling. Her flank pains also resolved after treatment of these stones.   Previous HPI 08/02/2021 Autumn Duran is a 72 y.o. female here for follow up for retroperitoneal fibrosis on azathioprine 100 mg daily. She is doing well overall she had one episode of right flank and lower abdominal pain lasting about 1 week in duration. She also started Diovan 80-12.5mg  for hypertension  with improvement in headaches and blood pressure. She is not requiring and furosemide and no recent edema. She does report some increase in urinary frequency during the day times, with occasional leaking during coughing or sneezing. No pain with urination though. She does not recall exactly when this started.     Previous HPI 05/02/2021 Autumn Duran is a 72 y.o. female here for follow up for retroperitoneal fibrosis on azathioprine 100 mg daily. She discontinued prednisone after tapering she reported some increased pain in her hand and bones. This started about 3 weeks after she stopped the 2.5 mg prednisone. She also noticed a large increase in pedal edema with stopping prednisone that cleared up after 3 doses of lasix prescribed from PCP office. She has some hand pain worst in the 5th fingers and low back pain. Her left leg and knee gave her some trouble also more stiffness and weakness than pain. She resumed taking prednisone 2.5 mg about 2 weeks ago with a large improvement in symptoms again.  She had one episode of right sided abdominal pain starting to the side and moving to the center in lower abdomen then resolved. She took one percocet due to severity of pain. She never saw any frank blood or stone during this.   Previous HPI 01/31/21 Autumn Duran is a 72 y.o. female here for follow up of retroperitoneal fibrosis with hydronephrosis on azathioprine 100 mg PO daily and prednisone 5 mg daily. She tapered prednisone down from 15 mg to 5 mg daily since last visit without noticing specific problems. She had URI symptoms with congestion and drainage treated with amoxicillin. Weight gain with central adiposity increase she reports 16 pounds total. She notices some sensitivity with monitor bumps playing with her grandson. She is scheduled for renal ultrasound on 12/8 for monitoring by Dr. Ronne Binning.   Previous HPI 10/11/20 Autumn Duran is a 72 y.o. female here for follow up for retroperitoneal  fibrosis on prednisone 15 mg daily and azathioprine 100 mg p.o. daily.  Since our last visit she became ill with COVID-pneumonia symptoms took the paxlovid treatment for this and respiratory symptoms recovered completely.  She feels overall well recovered from the problem.  The left-sided chest wall and flank pain and left arm pain from her previous fall are also improved further.  She is noticing some swelling that comes and goes in her lower extremities more than before.  Lower abdominal skin rash is improved   Review of Systems  Constitutional:  Negative for fatigue.  HENT:  Negative for mouth sores and mouth dryness.   Eyes:  Positive for dryness.  Respiratory:  Negative for shortness of breath.   Cardiovascular:  Negative for chest pain and palpitations.  Gastrointestinal:  Positive for blood in stool and constipation. Negative for diarrhea.  Endocrine: Negative for increased urination.  Genitourinary:  Negative for involuntary urination.  Musculoskeletal:  Positive for joint pain, joint pain and morning stiffness. Negative for gait problem, joint swelling, myalgias, muscle  weakness, muscle tenderness and myalgias.  Skin:  Negative for color change, rash, hair loss and sensitivity to sunlight.  Allergic/Immunologic: Positive for susceptible to infections.  Neurological:  Negative for dizziness and headaches.  Hematological:  Negative for swollen glands.  Psychiatric/Behavioral:  Positive for sleep disturbance. Negative for depressed mood. The patient is not nervous/anxious.     PMFS History:  Patient Active Problem List   Diagnosis Date Noted   Closed fracture of right elbow 11/21/2022   Urinary frequency 08/02/2021   Osteoarthritis of both hands 05/02/2021   COVID-19 10/11/2020   Right-sided back pain 10/11/2020   Left-sided chest wall pain 08/09/2020   High risk medication use 08/09/2020   Varicose veins of leg with swelling, bilateral 07/05/2020   Primary hypercholesterolemia  05/11/2020   Compression fracture of L1 lumbar vertebra (HCC) 05/11/2020   Bilateral nephrolithiasis 05/11/2020   Bilateral hydronephrosis 04/14/2020   Retroperitoneal fibrosis 03/09/2020   Degenerative scoliosis 03/05/2020   Degenerative spondylolisthesis 03/05/2020   Opioid dependence (HCC) 09/18/2019   Elevated blood-pressure reading, without diagnosis of hypertension 01/08/2019   Closed fracture of proximal end of right humerus 06/26/2018   Paraesophageal hernia 12/12/2017   Osteoporosis 12/10/2017   Dysphagia 06/26/2017   Gastroesophageal reflux disease 06/26/2017   Hx of colonic polyps 06/26/2017   Nausea with vomiting 10/12/2014   Acute low back pain 08/13/2014   Epidural abscess 08/13/2014   Hypertension    Chronic back pain    Essential hypertension    Other iron deficiency anemia 04/10/2014   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/2014   Right hip pain 12/23/2013   Epidural abscess, L2-L5 11/04/2013   Lumbar compression fracture (HCC) 07/12/2013   Compression fracture 07/12/2013   Benign neoplasm of colon 12/18/2011   Esophageal reflux 12/18/2011   Other dysphagia 12/18/2011    Past Medical History:  Diagnosis Date   Bronchitis, chronic (HCC)    Cancer (HCC)    Chronic back pain    Chronic kidney disease    kidney stone s/p stent placement ( removed)   Complication of anesthesia    Compression fracture of L3 lumbar vertebra    Diverticulosis    Elbow fracture 10/2022   GERD (gastroesophageal reflux disease)    Hemorrhoids    Hiatal hernia    History of kidney stones    Hx of adenomatous colonic polyps    Hyperlipidemia    Hypertension    Lumbar radicular pain    Osteoporosis 12/10/2017   PONV (postoperative nausea and vomiting)    Sore throat    T7 vertebral fracture (HCC)     Family History  Problem Relation Age of Onset   Breast cancer Sister 5   Breast cancer Other 32       aunt   Hypertension Mother    COPD Mother    COPD Father    Hypertension  Sister    Aortic aneurysm Sister    Hypertension Son    Past Surgical History:  Procedure Laterality Date   BACK SURGERY     BONE MARROW ASPIRATION Left 02/2014   BONE MARROW BIOPSY Left 02/2014   CHOLECYSTECTOMY     COLONOSCOPY  12/18/2011   Procedure: COLONOSCOPY;  Surgeon: Hart Carwin, MD;  Location: [facility];  Service: Endoscopy;  Laterality: N/A;   COLONOSCOPY WITH PROPOFOL N/A 08/03/2017   Procedure: COLONOSCOPY WITH PROPOFOL;  Surgeon: Malissa Hippo, MD;  Location: [facility];  Service: Endoscopy;  Laterality: N/A;   CYSTOSCOPY W/ URETERAL STENT  PLACEMENT Bilateral 08/18/2021   Procedure: CYSTOSCOPY WITH  BILATERAL RETROGRADE PYELOGRAM BILATERAL Otilio Miu STENT PLACEMENT;  Surgeon: Malen Gauze, MD;  Location: [facility];  Service: Urology;  Laterality: Bilateral;   CYSTOSCOPY WITH RETROGRADE PYELOGRAM, URETEROSCOPY AND STENT PLACEMENT Bilateral 09/01/2021   Procedure: CYSTOSCOPY WITH  bilateral RETROGRADE PYELOGRAM, URETEROSCOPY AND  bilateral STENT exchange;  Surgeon: Malen Gauze, MD;  Location: [facility];  Service: Urology;  Laterality: Bilateral;   CYSTOSCOPY/RETROGRADE/URETEROSCOPY/STONE EXTRACTION WITH BASKET Bilateral 04/18/2018   Procedure: CYSTOSCOPY/BILATERAL RETROGRADE/URETEROSCOPY/STONE EXTRACTION WITH BASKET WITH RIGHT STENT PLACEMENT;  Surgeon: Malen Gauze, MD;  Location: [facility];  Service: Urology;  Laterality: Bilateral;  1 HR   ESOPHAGEAL DILATION N/A 08/03/2017   Procedure: ESOPHAGEAL DILATION;  Surgeon: Malissa Hippo, MD;  Location: [facility];  Service: Endoscopy;  Laterality: N/A;   ESOPHAGOGASTRODUODENOSCOPY  12/18/2011   Procedure: ESOPHAGOGASTRODUODENOSCOPY (EGD);  Surgeon: Hart Carwin, MD;  Location: [facility];  Service: Endoscopy;  Laterality: N/A;   ESOPHAGOGASTRODUODENOSCOPY (EGD) WITH PROPOFOL N/A 08/03/2017   Procedure: ESOPHAGOGASTRODUODENOSCOPY (EGD) WITH PROPOFOL;  Surgeon: Malissa Hippo, MD;  Location: [facility];   Service: Endoscopy;  Laterality: N/A;  pt knows to arrive at 6:15   HERNIA REPAIR     HOLMIUM LASER APPLICATION Right 04/18/2018   Procedure: HOLMIUM LASER APPLICATION;  Surgeon: Malen Gauze, MD;  Location: [facility];  Service: Urology;  Laterality: Right;   HOLMIUM LASER APPLICATION Bilateral 09/01/2021   Procedure: HOLMIUM LASER APPLICATION;  Surgeon: Malen Gauze, MD;  Location: [facility];  Service: Urology;  Laterality: Bilateral;   LUMBAR LAMINECTOMY FOR EPIDURAL ABSCESS Left 09/22/2013   Procedure: LUMBAR LAMINECTOMY FOR EPIDURAL ABSCESS LEFT LUMBAR FIVE-SACRAL ONE;  Surgeon: Temple Pacini, MD;  Location: [facility];  Service: Neurosurgery;  Laterality: Left;   RADIOLOGY WITH ANESTHESIA N/A 08/13/2014   Procedure: RADIOLOGY WITH ANESTHESIA ;  Surgeon: Medication Radiologist, MD;  Location: [facility];  Service: Radiology;  Laterality: N/A;   URETERAL STENT PLACEMENT     VERTEBROPLASTY N/A 07/14/2013   Procedure: VERTEBROPLASTY WITH LUMBAR THREE BIOPSY;  Surgeon: Temple Pacini, MD;  Location: [facility];  Service: Neurosurgery;  Laterality: N/A;  VERTEBROPLASTY WITH LUMBAR THREE BIOPSY   Social History   Social History Narrative   Not on file   There is no immunization history for the selected administration types on file for this patient.   Objective: Vital Signs: BP 114/62 (BP Location: Left Arm, Patient Position: Sitting, Cuff Size: Normal)   Pulse (!) 51   Resp 14   Ht 5\' 1"  (1.549 m)   Wt 155 lb (70.3 kg)   BMI 29.29 kg/m    Physical Exam Eyes:     Conjunctiva/sclera: Conjunctivae normal.  Cardiovascular:     Rate and Rhythm: Normal rate and regular rhythm.  Pulmonary:     Effort: Pulmonary effort is normal.     Breath sounds: Normal breath sounds.  Abdominal:     Tenderness: There is no abdominal tenderness. There is no guarding.  Musculoskeletal:     Right lower leg: No edema.     Left lower leg: No edema.  Lymphadenopathy:     Cervical: No cervical  adenopathy.  Skin:    General: Skin is warm and dry.     Findings: No rash.  Neurological:     Mental Status: She is alert.  Psychiatric:        Mood and Affect: Mood normal.  Musculoskeletal Exam:  Shoulders full ROM no tenderness or swelling Right arm in sling for elbow fracture, not significantly swollen Wrists full ROM no tenderness or swelling Fingers full ROM no tenderness or swelling No CVA tenderness no low back tenderness Knees full ROM no tenderness or swelling, patellofemoral crepitus Ankles full ROM no tenderness or swelling   Investigation: No additional findings.  Imaging: No results found.  Recent Labs: Lab Results  Component Value Date   WBC 10.2 08/15/2022   HGB 12.1 08/15/2022   PLT 437 (H) 08/15/2022   NA 139 08/15/2022   K 4.6 08/15/2022   CL 102 08/15/2022   CO2 31 08/15/2022   GLUCOSE 97 08/15/2022   BUN 35 (H) 08/15/2022   CREATININE 1.27 (H) 08/15/2022   BILITOT 0.4 08/15/2022   ALKPHOS 79 06/12/2019   AST 14 08/15/2022   ALT 7 08/15/2022   PROT 7.3 08/15/2022   ALBUMIN 3.5 06/12/2019   CALCIUM 10.5 (H) 08/15/2022   GFRAA 67 08/09/2020    Speciality Comments: No specialty comments available.  Procedures:  No procedures performed Allergies: Chlorhexidine gluconate, Morphine and codeine, and Tetracycline   Assessment / Plan:     Visit Diagnoses: Retroperitoneal fibrosis - Plan: Sedimentation rate  Currently does not describe any symptoms in abdomen or low back suggesting inflammation.  Rechecking her labs today with sed rate and complete metabolic panel.  I discussed we may need to get repeat MRI at some point for better visualization.  Plan to continue prednisone 5 mg daily and azathioprine 100 mg daily.  High risk medication use - azathioprine 50 mg - Plan: CBC with Differential/Platelet, COMPLETE METABOLIC PANEL WITH GFR  Checking CBC and CMP for medication monitoring and long-term continued use of azathioprine.  No serious  interval infections.  New arm fracture discussed long-term glucocorticoid use would be an additional indication for osteoporosis treatment.  Chronic midline low back pain without sciatica - Continues on low-dose chronic pain medication for this.  Vitamin D deficiency - Plan: VITAMIN D 25 Hydroxy (Vit-D Deficiency, Fractures)  Checking serum vitamin D level today.  Previous diagnosis of osteoporosis on bone densitometry from 2016 and 2019.  By review of records she was treated with Reclast infusion at least once in 2016 and it.  She was prescribed alendronate I do not see the exact time and duration of treatment.  Discussed possible addition of weekly Fosamax given her osteoporosis, or new recent fracture, and long-term continued low-dose glucocorticoid exposure.  Closed fracture of right elbow with routine healing, subsequent encounter    Orders: Orders Placed This Encounter  Procedures   Sedimentation rate   CBC with Differential/Platelet   COMPLETE METABOLIC PANEL WITH GFR   VITAMIN D 25 Hydroxy (Vit-D Deficiency, Fractures)   No orders of the defined types were placed in this encounter.    Follow-Up Instructions: Return in about 3 months (around 02/20/2023) for RPF on AZA/GC f/u 3mos.   Fuller Plan, MD  Note - This record has been created using [employer].  Chart creation errors have been sought, but may not always  have been located. Such creation errors do not reflect on  the standard of medical care.

## 2022-11-09 DIAGNOSIS — S52134A Nondisplaced fracture of neck of right radius, initial encounter for closed fracture: Secondary | ICD-10-CM | POA: Diagnosis not present

## 2022-11-21 ENCOUNTER — Encounter: Payer: Self-pay | Admitting: Internal Medicine

## 2022-11-21 ENCOUNTER — Ambulatory Visit: Payer: Medicare HMO | Attending: Internal Medicine | Admitting: Internal Medicine

## 2022-11-21 VITALS — BP 114/62 | HR 51 | Resp 14 | Ht 61.0 in | Wt 155.0 lb

## 2022-11-21 DIAGNOSIS — Z79899 Other long term (current) drug therapy: Secondary | ICD-10-CM | POA: Diagnosis not present

## 2022-11-21 DIAGNOSIS — S42401D Unspecified fracture of lower end of right humerus, subsequent encounter for fracture with routine healing: Secondary | ICD-10-CM | POA: Diagnosis not present

## 2022-11-21 DIAGNOSIS — M545 Low back pain, unspecified: Secondary | ICD-10-CM | POA: Diagnosis not present

## 2022-11-21 DIAGNOSIS — K682 Retroperitoneal fibrosis: Secondary | ICD-10-CM | POA: Diagnosis not present

## 2022-11-21 DIAGNOSIS — E559 Vitamin D deficiency, unspecified: Secondary | ICD-10-CM

## 2022-11-21 DIAGNOSIS — Z7952 Long term (current) use of systemic steroids: Secondary | ICD-10-CM | POA: Diagnosis not present

## 2022-11-21 DIAGNOSIS — G8929 Other chronic pain: Secondary | ICD-10-CM

## 2022-11-21 DIAGNOSIS — S42401A Unspecified fracture of lower end of right humerus, initial encounter for closed fracture: Secondary | ICD-10-CM | POA: Insufficient documentation

## 2022-11-22 LAB — CBC WITH DIFFERENTIAL/PLATELET
Absolute Monocytes: 319 cells/uL (ref 200–950)
Basophils Absolute: 55 cells/uL (ref 0–200)
Basophils Relative: 0.6 %
Eosinophils Absolute: 73 cells/uL (ref 15–500)
Eosinophils Relative: 0.8 %
HCT: 38.5 % (ref 35.0–45.0)
Hemoglobin: 12.1 g/dL (ref 11.7–15.5)
Lymphs Abs: 683 cells/uL — ABNORMAL LOW (ref 850–3900)
MCH: 28.7 pg (ref 27.0–33.0)
MCHC: 31.4 g/dL — ABNORMAL LOW (ref 32.0–36.0)
MCV: 91.4 fL (ref 80.0–100.0)
MPV: 10 fL (ref 7.5–12.5)
Monocytes Relative: 3.5 %
Neutro Abs: 7972 cells/uL — ABNORMAL HIGH (ref 1500–7800)
Neutrophils Relative %: 87.6 %
Platelets: 304 10*3/uL (ref 140–400)
RBC: 4.21 10*6/uL (ref 3.80–5.10)
RDW: 14.8 % (ref 11.0–15.0)
Total Lymphocyte: 7.5 %
WBC: 9.1 10*3/uL (ref 3.8–10.8)

## 2022-11-22 LAB — COMPLETE METABOLIC PANEL WITH GFR
AG Ratio: 1.4 (calc) (ref 1.0–2.5)
ALT: 7 U/L (ref 6–29)
AST: 13 U/L (ref 10–35)
Albumin: 3.9 g/dL (ref 3.6–5.1)
Alkaline phosphatase (APISO): 58 U/L (ref 37–153)
BUN/Creatinine Ratio: 13 (calc) (ref 6–22)
BUN: 15 mg/dL (ref 7–25)
CO2: 28 mmol/L (ref 20–32)
Calcium: 10.2 mg/dL (ref 8.6–10.4)
Chloride: 105 mmol/L (ref 98–110)
Creat: 1.2 mg/dL — ABNORMAL HIGH (ref 0.60–1.00)
Globulin: 2.7 g/dL (calc) (ref 1.9–3.7)
Glucose, Bld: 115 mg/dL — ABNORMAL HIGH (ref 65–99)
Potassium: 4 mmol/L (ref 3.5–5.3)
Sodium: 141 mmol/L (ref 135–146)
Total Bilirubin: 0.4 mg/dL (ref 0.2–1.2)
Total Protein: 6.6 g/dL (ref 6.1–8.1)
eGFR: 48 mL/min/{1.73_m2} — ABNORMAL LOW (ref >=60)

## 2022-11-22 LAB — VITAMIN D 25 HYDROXY (VIT D DEFICIENCY, FRACTURES): Vit D, 25-Hydroxy: 30 ng/mL (ref 30–100)

## 2022-11-22 LAB — SEDIMENTATION RATE: Sed Rate: 34 mm/h — ABNORMAL HIGH (ref 0–30)

## 2022-11-23 ENCOUNTER — Other Ambulatory Visit: Payer: Self-pay | Admitting: Internal Medicine

## 2022-11-23 DIAGNOSIS — K682 Retroperitoneal fibrosis: Secondary | ICD-10-CM

## 2022-11-23 NOTE — Telephone Encounter (Signed)
Last Fill: 08/16/2022  Labs: 11/21/2022 Glucose 115, Creat. 1.20, GFR 48, MCHC 31.4, Neutro Abs 7,972, Lypmhs Abs 683  Next Visit: 03/06/2023  Last Visit: 11/21/2022  DX: Retroperitoneal fibrosis   Current Dose per office note 11/21/2022: azathioprine 100 mg daily   Okay to refill Imuran?

## 2022-11-24 NOTE — Progress Notes (Signed)
Sedimentation rate improved to 34 down from 84 3 months ago.  Blood count and kidney and liver function look fine.  Her estimated GFR is 48 which is stable.  Vitamin D is at goal of 30. No change needed with current medications appear to be working better since increasing the azathioprine again.

## 2022-11-28 DIAGNOSIS — F1120 Opioid dependence, uncomplicated: Secondary | ICD-10-CM | POA: Diagnosis not present

## 2022-11-28 DIAGNOSIS — K5903 Drug induced constipation: Secondary | ICD-10-CM | POA: Diagnosis not present

## 2022-11-28 DIAGNOSIS — M961 Postlaminectomy syndrome, not elsewhere classified: Secondary | ICD-10-CM | POA: Diagnosis not present

## 2022-11-28 DIAGNOSIS — S52134A Nondisplaced fracture of neck of right radius, initial encounter for closed fracture: Secondary | ICD-10-CM | POA: Diagnosis not present

## 2022-11-28 DIAGNOSIS — M25521 Pain in right elbow: Secondary | ICD-10-CM | POA: Diagnosis not present

## 2022-11-28 DIAGNOSIS — Z6829 Body mass index (BMI) 29.0-29.9, adult: Secondary | ICD-10-CM | POA: Diagnosis not present

## 2022-12-13 ENCOUNTER — Other Ambulatory Visit: Payer: Self-pay | Admitting: Internal Medicine

## 2022-12-13 DIAGNOSIS — K682 Retroperitoneal fibrosis: Secondary | ICD-10-CM

## 2022-12-13 NOTE — Telephone Encounter (Signed)
Last Fill: 10/03/2022  Next Visit: 03/06/2023  Last Visit: 11/21/2022  Dx: Retroperitoneal fibrosis   Current Dose per office note on 11/21/2022: prednisone 5 mg daily   Okay to refill Prednisone?

## 2022-12-18 DIAGNOSIS — S52134A Nondisplaced fracture of neck of right radius, initial encounter for closed fracture: Secondary | ICD-10-CM | POA: Diagnosis not present

## 2022-12-19 DIAGNOSIS — R5383 Other fatigue: Secondary | ICD-10-CM | POA: Diagnosis not present

## 2022-12-19 DIAGNOSIS — E7800 Pure hypercholesterolemia, unspecified: Secondary | ICD-10-CM | POA: Diagnosis not present

## 2023-01-19 ENCOUNTER — Ambulatory Visit: Payer: Medicare HMO | Admitting: Urology

## 2023-01-26 ENCOUNTER — Encounter (HOSPITAL_COMMUNITY): Payer: Self-pay

## 2023-01-26 ENCOUNTER — Emergency Department (HOSPITAL_COMMUNITY): Payer: Medicare HMO

## 2023-01-26 ENCOUNTER — Other Ambulatory Visit: Payer: Self-pay

## 2023-01-26 ENCOUNTER — Inpatient Hospital Stay (HOSPITAL_COMMUNITY)
Admission: EM | Admit: 2023-01-26 | Discharge: 2023-01-28 | DRG: 871 | Disposition: A | Discharge status: Home / Self Care | Payer: Medicare HMO | Attending: Family Medicine | Admitting: Family Medicine

## 2023-01-26 DIAGNOSIS — Z79624 Long term (current) use of inhibitors of nucleotide synthesis: Secondary | ICD-10-CM

## 2023-01-26 DIAGNOSIS — N12 Tubulo-interstitial nephritis, not specified as acute or chronic: Principal | ICD-10-CM

## 2023-01-26 DIAGNOSIS — J189 Pneumonia, unspecified organism: Secondary | ICD-10-CM | POA: Diagnosis present

## 2023-01-26 DIAGNOSIS — K8689 Other specified diseases of pancreas: Secondary | ICD-10-CM | POA: Diagnosis not present

## 2023-01-26 DIAGNOSIS — N10 Acute tubulo-interstitial nephritis: Secondary | ICD-10-CM | POA: Diagnosis not present

## 2023-01-26 DIAGNOSIS — N189 Chronic kidney disease, unspecified: Secondary | ICD-10-CM | POA: Insufficient documentation

## 2023-01-26 DIAGNOSIS — E876 Hypokalemia: Secondary | ICD-10-CM | POA: Diagnosis not present

## 2023-01-26 DIAGNOSIS — Z79899 Other long term (current) drug therapy: Secondary | ICD-10-CM

## 2023-01-26 DIAGNOSIS — I129 Hypertensive chronic kidney disease with stage 1 through stage 4 chronic kidney disease, or unspecified chronic kidney disease: Secondary | ICD-10-CM | POA: Diagnosis present

## 2023-01-26 DIAGNOSIS — H6090 Unspecified otitis externa, unspecified ear: Secondary | ICD-10-CM | POA: Insufficient documentation

## 2023-01-26 DIAGNOSIS — R058 Other specified cough: Secondary | ICD-10-CM | POA: Diagnosis not present

## 2023-01-26 DIAGNOSIS — H65192 Other acute nonsuppurative otitis media, left ear: Secondary | ICD-10-CM | POA: Diagnosis not present

## 2023-01-26 DIAGNOSIS — Z6829 Body mass index (BMI) 29.0-29.9, adult: Secondary | ICD-10-CM

## 2023-01-26 DIAGNOSIS — Z825 Family history of asthma and other chronic lower respiratory diseases: Secondary | ICD-10-CM

## 2023-01-26 DIAGNOSIS — Z885 Allergy status to narcotic agent status: Secondary | ICD-10-CM | POA: Diagnosis not present

## 2023-01-26 DIAGNOSIS — K219 Gastro-esophageal reflux disease without esophagitis: Secondary | ICD-10-CM | POA: Diagnosis not present

## 2023-01-26 DIAGNOSIS — J168 Pneumonia due to other specified infectious organisms: Secondary | ICD-10-CM | POA: Diagnosis not present

## 2023-01-26 DIAGNOSIS — Z888 Allergy status to other drugs, medicaments and biological substances status: Secondary | ICD-10-CM

## 2023-01-26 DIAGNOSIS — E782 Mixed hyperlipidemia: Secondary | ICD-10-CM | POA: Diagnosis not present

## 2023-01-26 DIAGNOSIS — H60392 Other infective otitis externa, left ear: Secondary | ICD-10-CM

## 2023-01-26 DIAGNOSIS — E8809 Other disorders of plasma-protein metabolism, not elsewhere classified: Secondary | ICD-10-CM | POA: Insufficient documentation

## 2023-01-26 DIAGNOSIS — Z8249 Family history of ischemic heart disease and other diseases of the circulatory system: Secondary | ICD-10-CM

## 2023-01-26 DIAGNOSIS — E46 Unspecified protein-calorie malnutrition: Secondary | ICD-10-CM | POA: Diagnosis not present

## 2023-01-26 DIAGNOSIS — E441 Mild protein-calorie malnutrition: Secondary | ICD-10-CM | POA: Diagnosis not present

## 2023-01-26 DIAGNOSIS — J9811 Atelectasis: Secondary | ICD-10-CM | POA: Diagnosis not present

## 2023-01-26 DIAGNOSIS — R112 Nausea with vomiting, unspecified: Secondary | ICD-10-CM | POA: Diagnosis not present

## 2023-01-26 DIAGNOSIS — N1330 Unspecified hydronephrosis: Secondary | ICD-10-CM

## 2023-01-26 DIAGNOSIS — A419 Sepsis, unspecified organism: Secondary | ICD-10-CM | POA: Diagnosis not present

## 2023-01-26 DIAGNOSIS — M810 Age-related osteoporosis without current pathological fracture: Secondary | ICD-10-CM | POA: Diagnosis not present

## 2023-01-26 DIAGNOSIS — R918 Other nonspecific abnormal finding of lung field: Secondary | ICD-10-CM | POA: Diagnosis not present

## 2023-01-26 DIAGNOSIS — N179 Acute kidney failure, unspecified: Secondary | ICD-10-CM | POA: Diagnosis present

## 2023-01-26 DIAGNOSIS — N136 Pyonephrosis: Secondary | ICD-10-CM | POA: Diagnosis present

## 2023-01-26 DIAGNOSIS — N1832 Chronic kidney disease, stage 3b: Secondary | ICD-10-CM | POA: Diagnosis present

## 2023-01-26 DIAGNOSIS — Z1152 Encounter for screening for COVID-19: Secondary | ICD-10-CM

## 2023-01-26 DIAGNOSIS — Z881 Allergy status to other antibiotic agents status: Secondary | ICD-10-CM | POA: Diagnosis not present

## 2023-01-26 DIAGNOSIS — R109 Unspecified abdominal pain: Secondary | ICD-10-CM | POA: Diagnosis not present

## 2023-01-26 DIAGNOSIS — E860 Dehydration: Secondary | ICD-10-CM | POA: Insufficient documentation

## 2023-01-26 DIAGNOSIS — H6092 Unspecified otitis externa, left ear: Secondary | ICD-10-CM | POA: Diagnosis not present

## 2023-01-26 DIAGNOSIS — Z803 Family history of malignant neoplasm of breast: Secondary | ICD-10-CM | POA: Diagnosis not present

## 2023-01-26 DIAGNOSIS — N132 Hydronephrosis with renal and ureteral calculous obstruction: Secondary | ICD-10-CM | POA: Diagnosis not present

## 2023-01-26 LAB — RESP PANEL BY RT-PCR (RSV, FLU A&B, COVID)  RVPGX2
Influenza A by PCR: NEGATIVE
Influenza B by PCR: NEGATIVE
Resp Syncytial Virus by PCR: NEGATIVE
SARS Coronavirus 2 by RT PCR: NEGATIVE

## 2023-01-26 LAB — URINALYSIS, ROUTINE W REFLEX MICROSCOPIC
Bilirubin Urine: NEGATIVE
Glucose, UA: NEGATIVE mg/dL
Ketones, ur: 5 mg/dL — AB
Nitrite: NEGATIVE
Protein, ur: 100 mg/dL — AB
RBC / HPF: >50 RBC/hpf (ref 0–5)
Specific Gravity, Urine: 1.014 (ref 1.005–1.030)
WBC, UA: >50 WBC/hpf (ref 0–5)
pH: 6 (ref 5.0–8.0)

## 2023-01-26 LAB — LACTIC ACID, PLASMA: Lactic Acid, Venous: 0.9 mmol/L (ref 0.5–1.9)

## 2023-01-26 LAB — BASIC METABOLIC PANEL
Anion gap: 10 (ref 5–15)
BUN: 28 mg/dL — ABNORMAL HIGH (ref 8–23)
CO2: 27 mmol/L (ref 22–32)
Calcium: 10.3 mg/dL (ref 8.9–10.3)
Chloride: 100 mmol/L (ref 98–111)
Creatinine, Ser: 1.59 mg/dL — ABNORMAL HIGH (ref 0.44–1.00)
GFR, Estimated: 34 mL/min — ABNORMAL LOW (ref >60)
Glucose, Bld: 111 mg/dL — ABNORMAL HIGH (ref 70–99)
Potassium: 3.3 mmol/L — ABNORMAL LOW (ref 3.5–5.1)
Sodium: 137 mmol/L (ref 135–145)

## 2023-01-26 LAB — CBC
HCT: 37.5 % (ref 36.0–46.0)
Hemoglobin: 12.4 g/dL (ref 12.0–15.0)
MCH: 30.3 pg (ref 26.0–34.0)
MCHC: 33.1 g/dL (ref 30.0–36.0)
MCV: 91.7 fL (ref 80.0–100.0)
Platelets: 287 10*3/uL (ref 150–400)
RBC: 4.09 MIL/uL (ref 3.87–5.11)
RDW: 13.9 % (ref 11.5–15.5)
WBC: 13.2 10*3/uL — ABNORMAL HIGH (ref 4.0–10.5)
nRBC: 0 % (ref 0.0–0.2)

## 2023-01-26 LAB — HEPATIC FUNCTION PANEL
ALT: 6 U/L (ref 0–44)
AST: 10 U/L — ABNORMAL LOW (ref 15–41)
Albumin: 3.2 g/dL — ABNORMAL LOW (ref 3.5–5.0)
Alkaline Phosphatase: 59 U/L (ref 38–126)
Bilirubin, Direct: 0.3 mg/dL — ABNORMAL HIGH (ref 0.0–0.2)
Indirect Bilirubin: 0.9 mg/dL (ref 0.3–0.9)
Total Bilirubin: 1.2 mg/dL — ABNORMAL HIGH (ref <1.2)
Total Protein: 7.1 g/dL (ref 6.5–8.1)

## 2023-01-26 MED ORDER — HEPARIN SODIUM (PORCINE) 5000 UNIT/ML IJ SOLN
5000.0000 [IU] | Freq: Three times a day (TID) | INTRAMUSCULAR | Status: DC
  Administered 2023-01-27: 5000 [IU] via SUBCUTANEOUS
  Filled 2023-01-26 (×3): qty 1

## 2023-01-26 MED ORDER — SODIUM CHLORIDE 0.9 % IV SOLN
1.0000 g | Freq: Once | INTRAVENOUS | Status: AC
  Administered 2023-01-26: 1 g via INTRAVENOUS
  Filled 2023-01-26: qty 10

## 2023-01-26 MED ORDER — ONDANSETRON HCL 4 MG PO TABS: 4.0000 mg | ORAL_TABLET | Freq: Four times a day (QID) | ORAL | Status: DC | PRN

## 2023-01-26 MED ORDER — ACETAMINOPHEN 325 MG PO TABS
650.0000 mg | ORAL_TABLET | Freq: Four times a day (QID) | ORAL | Status: DC | PRN
  Administered 2023-01-27 – 2023-01-28 (×4): 650 mg via ORAL
  Filled 2023-01-26 (×4): qty 2

## 2023-01-26 MED ORDER — CEFTRIAXONE SODIUM 2 G IJ SOLR
2.0000 g | INTRAMUSCULAR | Status: DC
  Administered 2023-01-27 – 2023-01-28 (×2): 2 g via INTRAVENOUS
  Filled 2023-01-26: qty 20

## 2023-01-26 MED ORDER — POTASSIUM CHLORIDE CRYS ER 20 MEQ PO TBCR
40.0000 meq | EXTENDED_RELEASE_TABLET | Freq: Once | ORAL | Status: AC
  Administered 2023-01-26: 40 meq via ORAL
  Filled 2023-01-26: qty 2

## 2023-01-26 MED ORDER — ONDANSETRON HCL 4 MG/2ML IJ SOLN
4.0000 mg | Freq: Once | INTRAMUSCULAR | Status: AC
  Administered 2023-01-26: 4 mg via INTRAVENOUS
  Filled 2023-01-26: qty 2

## 2023-01-26 MED ORDER — ACETAMINOPHEN 650 MG RE SUPP: 650.0000 mg | Freq: Four times a day (QID) | RECTAL | Status: DC | PRN

## 2023-01-26 MED ORDER — ONDANSETRON HCL 4 MG/2ML IJ SOLN
4.0000 mg | Freq: Four times a day (QID) | INTRAMUSCULAR | Status: DC | PRN
  Administered 2023-01-27 – 2023-01-28 (×2): 4 mg via INTRAVENOUS
  Filled 2023-01-26 (×3): qty 2

## 2023-01-26 MED ORDER — DM-GUAIFENESIN ER 30-600 MG PO TB12
1.0000 | ORAL_TABLET | Freq: Two times a day (BID) | ORAL | Status: DC
  Administered 2023-01-26 – 2023-01-28 (×4): 1 via ORAL
  Filled 2023-01-26 (×4): qty 1

## 2023-01-26 MED ORDER — ENSURE ENLIVE PO LIQD
237.0000 mL | Freq: Two times a day (BID) | ORAL | Status: DC
  Administered 2023-01-27: 237 mL via ORAL

## 2023-01-26 MED ORDER — LACTATED RINGERS IV SOLN: INTRAVENOUS | Status: AC

## 2023-01-26 MED ORDER — SODIUM CHLORIDE 0.9 % IV SOLN
500.0000 mg | INTRAVENOUS | Status: DC
  Administered 2023-01-26 – 2023-01-27 (×2): 500 mg via INTRAVENOUS
  Filled 2023-01-26 (×2): qty 5

## 2023-01-26 MED ORDER — LACTATED RINGERS IV BOLUS
1000.0000 mL | Freq: Once | INTRAVENOUS | Status: AC
  Administered 2023-01-26: 1000 mL via INTRAVENOUS

## 2023-01-26 MED ORDER — MELATONIN 3 MG PO TABS
6.0000 mg | ORAL_TABLET | Freq: Once | ORAL | Status: AC
  Administered 2023-01-26: 6 mg via ORAL
  Filled 2023-01-26: qty 2

## 2023-01-26 MED ORDER — SODIUM CHLORIDE 0.9% FLUSH
10.0000 mL | Freq: Two times a day (BID) | INTRAVENOUS | Status: DC
  Administered 2023-01-26 – 2023-01-28 (×4): 10 mL via INTRAVENOUS

## 2023-01-26 NOTE — Sepsis Progress Note (Signed)
Elink monitoring for the code sepsis protocol.  

## 2023-01-26 NOTE — ED Provider Notes (Signed)
[facility] Provider Note   CSN: [id number] Arrival date & time: 01/26/23  1216     History {Add pertinent medical, surgical, social history, OB history to HPI:1} Chief Complaint  Patient presents with   Flank Pain   Cough    Autumn Duran is a 72 y.o. female with PMH as listed below who presents with left side flank pain and nausea/nonbloody vomiting since Wednesday and also has a productive cough a/w headache, sore throat, L ear pain, and hoarseness. Also has woken up with sweats in the night and knows she has had a fever.  Has history of kidney stones and normally has felt the pain in her left lower quadrant, this does feel different than her prior kidney stones.  Denies any urinary symptoms except notes that her urine has been darker than normal.  Does not feel short of breath, has no chest pain.  Pain at this moment is minimal.   Past Medical History:  Diagnosis Date   Bronchitis, chronic (HCC)    Cancer (HCC)    Chronic back pain    Chronic kidney disease    kidney stone s/p stent placement ( removed)   Complication of anesthesia    Compression fracture of L3 lumbar vertebra    Diverticulosis    Elbow fracture 10/2022   GERD (gastroesophageal reflux disease)    Hemorrhoids    Hiatal hernia    History of kidney stones    Hx of adenomatous colonic polyps    Hyperlipidemia    Hypertension    Lumbar radicular pain    Osteoporosis 12/10/2017   PONV (postoperative nausea and vomiting)    Sore throat    T7 vertebral fracture (HCC)        Home Medications Prior to Admission medications   Medication Sig Start Date End Date Taking? Authorizing Provider  azaTHIOprine (IMURAN) 50 MG tablet TAKE 2 TABLETS BY MOUTH DAILY 11/24/22   Rice, Jamesetta Orleans, MD  HYDROcodone-acetaminophen (NORCO/VICODIN) 5-325 MG tablet Take 1 tablet by mouth every 6 (six) hours as needed (severe pain.). 03/03/19   [provider]   hydrocortisone 1 % ointment Apply 1 application topically 2 (two) times daily. Patient taking differently: Apply 1 application  topically 2 (two) times daily as needed (skin irritation.). 09/06/20   Fuller Plan, MD  ibuprofen (ADVIL) 200 MG tablet Take by mouth. 08/10/17   [provider]  ibuprofen (ADVIL) 600 MG tablet Take 1 tablet by mouth 4 (four) times daily as needed.    [provider]  omeprazole (PRILOSEC) 20 MG capsule Take 20 mg by mouth daily before breakfast.    [provider]  oxyCODONE (OXYCONTIN) 20 mg 12 hr tablet Take 20 mg by mouth in the morning.    [provider]  predniSONE (DELTASONE) 5 MG tablet TAKE 1 TABLET BY MOUTH DAILY WITH BREAKFAST 12/13/22   Rice, Jamesetta Orleans, MD  rosuvastatin (CRESTOR) 5 MG tablet Take 5 mg by mouth once a week. 07/17/22   [provider]  valsartan-hydrochlorothiazide (DIOVAN-HCT) 80-12.5 MG tablet Take 1 tablet by mouth daily.    [provider]  Vitamin D, Ergocalciferol, (DRISDOL) 1.25 MG (50000 UNIT) CAPS capsule TAKE 1 CAPSULE BY MOUTH ONCE A WEEK 09/12/21   Doreatha Massed, MD      Allergies    Chlorhexidine gluconate, Morphine and codeine, and Tetracycline    Review of Systems   Review of Systems A 10 point review of  systems was performed and is negative unless otherwise reported in HPI.  Physical Exam Updated Vital Signs BP 107/86 (BP Location: Right Arm)   Pulse 74   Temp 99.3 F (37.4 C) (Oral)   Resp 16   Ht 5\' 1"  (1.549 m)   Wt 70.3 kg   SpO2 92%   BMI 29.29 kg/m  Physical Exam General: Normal appearing female, lying in bed.  HEENT: PERRLA, EOMI, sclera anicteric, MMM, trachea midline.  Erythematous posterior pharynx without any tonsillar exudates.  Symmetric tonsillar pillars, uvula is midline.  Bulging left tympanic membrane with diffuse erythema.  Bulging right tympanic membrane without erythema.  Hoarse voice. Cardiology: RRR, no  murmurs/rubs/gallops.  Resp: Normal respiratory rate and effort. CTAB, no wheezes, rhonchi, crackles.  Abd: Soft, non-tender, non-distended. No rebound tenderness or guarding.  GU: Deferred. MSK: No peripheral edema or signs of trauma. Extremities without deformity or TTP. No cyanosis or clubbing. Skin: warm, dry.  Back: Left CVA tenderness.  No midline back tenderness, no right CVA tenderness. Neuro: A&Ox4, CNs II-XII grossly intact. MAEs. Sensation grossly intact.  Psych: Normal mood and affect.   ED Results / Procedures / Treatments   Labs (all labs ordered are listed, but only abnormal results are displayed) Labs Reviewed  CBC - Abnormal; Notable for the following components:      Result Value   WBC 13.2 (*)    All other components within normal limits  BASIC METABOLIC PANEL - Abnormal; Notable for the following components:   Potassium 3.3 (*)    Glucose, Bld 111 (*)    BUN 28 (*)    Creatinine, Ser 1.59 (*)    GFR, Estimated 34 (*)    All other components within normal limits  RESP PANEL BY RT-PCR (RSV, FLU A&B, COVID)  RVPGX2  URINALYSIS, ROUTINE W REFLEX MICROSCOPIC  HEPATIC FUNCTION PANEL    EKG None  Radiology No results found.  Procedures Procedures  {Document cardiac monitor, telemetry assessment procedure when appropriate:1}  Medications Ordered in ED Medications  ondansetron (ZOFRAN) injection 4 mg (has no administration in time range)    ED Course/ Medical Decision Making/ A&P                          Medical Decision Making Amount and/or Complexity of Data Reviewed Labs: ordered.    This patient presents to the ED for concern of flank pain, flu-like symptoms; this involves an extensive number of treatment options, and is a complaint that carries with it a high risk of complications and morbidity.  I considered the following differential and admission for this acute, potentially life threatening condition.   MDM:    Patient with bulging  bilateral tympanic membranes left side is erythematous, likely has left AOM.  Could be viral in nature given her associated symptoms.  Does have evidence of pharyngitis on exam.  Consider pneumonia, bronchitis, COVID or flu.  In addition she also has left lower back pain and left CVA tenderness.  Consider ureterolithiasis, nephrolithiasis, pyelonephritis, renal abscess.  Consider electrolyte derangements, AKI, dehydration.  Clinical Course as of 01/26/23 1648  Fri Jan 26, 2023  1630 WBC(!): 13.2 +leukocytosis [HN]  1630 Creatinine(!): 1.59 +AKI, BL ~1.2 [HN]    Clinical Course User Index [HN] Loetta Rough, MD    Labs: I Ordered, and personally interpreted labs.  The pertinent results include:  those litsed above  Imaging Studies ordered: I ordered imaging studies including CXR, CT renal  stone I independently visualized and interpreted imaging. I agree with the radiologist interpretation  Additional history obtained from chart review, sister at bedside.  External records from outside source obtained and reviewed including ***  Cardiac Monitoring: The patient was maintained on a cardiac monitor.  I personally viewed and interpreted the cardiac monitored which showed an underlying rhythm of: NSR  Reevaluation: After the interventions noted above, I reevaluated the patient and found that they have :{resolved/improved/worsened:23923::"improved"}  Social Determinants of Health:  lives independently  Disposition:  ***  Co morbidities that complicate the patient evaluation  Past Medical History:  Diagnosis Date   Bronchitis, chronic (HCC)    Cancer (HCC)    Chronic back pain    Chronic kidney disease    kidney stone s/p stent placement ( removed)   Complication of anesthesia    Compression fracture of L3 lumbar vertebra    Diverticulosis    Elbow fracture 10/2022   GERD (gastroesophageal reflux disease)    Hemorrhoids    Hiatal hernia    History of kidney stones    Hx of  adenomatous colonic polyps    Hyperlipidemia    Hypertension    Lumbar radicular pain    Osteoporosis 12/10/2017   PONV (postoperative nausea and vomiting)    Sore throat    T7 vertebral fracture (HCC)      Medicines Meds ordered this encounter  Medications   ondansetron (ZOFRAN) injection 4 mg    I have reviewed the patients home medicines and have made adjustments as needed  Problem List / ED Course: Problem List Items Addressed This Visit   None        {Document critical care time when appropriate:1} {Document review of labs and clinical decision tools ie heart score, Chads2Vasc2 etc:1}  {Document your independent review of radiology images, and any outside records:1} {Document your discussion with family members, caretakers, and with consultants:1} {Document social determinants of health affecting pt's care:1} {Document your decision making why or why not admission, treatments were needed:1}  This note was created using dictation software, which may contain spelling or grammatical errors.

## 2023-01-26 NOTE — Progress Notes (Signed)
CODE SEPSIS - PHARMACY COMMUNICATION  **Broad Spectrum Antibiotics should be administered within 1 hour of Sepsis diagnosis**  Time Code Sepsis Called/Page Received: 1839  Antibiotics Ordered: Ceftriaxone and azithromycin  Time of 1st antibiotic administration: 1808  Additional action taken by pharmacy: None  If necessary, Name of Provider/Nurse Contacted: None    Rockwell Alexandria ,PharmD Clinical Pharmacist  01/26/2023  6:57 PM

## 2023-01-26 NOTE — ED Triage Notes (Signed)
Pt report left side flank pain and vomiting with hx of kidney stones since Wednesday and also has a productive cough.

## 2023-01-26 NOTE — H&P (Addendum)
History and Physical    Patient: Autumn Duran ZOX:[id number] DOB: [birth year] DOA: 01/26/2023 DOS: the patient was seen and examined on 01/26/2023 PCP: Ignatius Specking, MD  Patient coming from: Home  Chief Complaint:  Chief Complaint  Patient presents with   Flank Pain   Cough   HPI: Autumn Duran is a 72 y.o. female with medical history significant of retroperitoneal fibrosis, GERD, hyperlipidemia, nephrolithiasis, severe left and moderate right hydronephrosis who presents to the emergency department due to a 2-day onset of left-sided flank pain associated with nausea and nonbloody vomiting.  She also complained of headache which was associated with sore throat, hoarseness, headache and left ear pain as well as subjective fever.  She endorsed having darker urine than normal, but denies any irritative bladder symptoms, chest pain, shortness of breath.  ED Course:  In the emergency department, she was hemodynamically stable, though BP was soft at 107/86.  Workup in the ED showed normal CBC except for leukocytosis.  BMP was normal except for potassium of 3.3, BUN/creatinine 20/1.59 (baseline creatinine at 1.0-1.2), lactic acid was normal.  Influenza A, B, SARS coronavirus 2, RSV was negative. Chest x-ray showed left basilar hazy and linear opacities, which may reflect atelectasis, aspiration or pneumonia.  Nodular opacities projecting over the lateral bilateral apices, left-greater-than-right, which may represent pulmonary nodules. Recommend further evaluation with chest CT. Questionable blunting of the left costophrenic angle, which may represent a small pleural effusion or pleural thickening. CT abdomen and pelvis without contrast showed numerous punctate bilateral renal calculi. Severe left and moderate right hydronephrosis, similar to the prior study. Consider chronic proximal ureteral obstructions, possibly secondary to retroperitoneal fibrosis.  Patchy airspace opacity within the left lower  lobe and lingula including a 2.9 cm rounded pleural based opacity with configuration favoring rounded atelectasis. Mild scattered ground-glass opacity in the right lower lobe. Findings may represent a combination of atelectasis and infection.  Urologist (Dr. Pete Glatter) was consulted and there was no indication for any surgical intervention at this time per EDP.  Patient was treated with IV ceftriaxone and azithromycin due to pneumonia as well as pyelonephritis.  IV LR 1 L was given, Zofran was provided and hospitalist was asked to admit patient for further evaluation and management.  Review of Systems: Review of systems as noted in the HPI. All other systems reviewed and are negative.   Past Medical History:  Diagnosis Date   Bronchitis, chronic (HCC)    Cancer (HCC)    Chronic back pain    Chronic kidney disease    kidney stone s/p stent placement ( removed)   Complication of anesthesia    Compression fracture of L3 lumbar vertebra    Diverticulosis    Elbow fracture 10/2022   GERD (gastroesophageal reflux disease)    Hemorrhoids    Hiatal hernia    History of kidney stones    Hx of adenomatous colonic polyps    Hyperlipidemia    Hypertension    Lumbar radicular pain    Osteoporosis 12/10/2017   PONV (postoperative nausea and vomiting)    Sore throat    T7 vertebral fracture [facility])    Past Surgical History:  Procedure Laterality Date   BACK SURGERY     BONE MARROW ASPIRATION Left 02/2014   BONE MARROW BIOPSY Left 02/2014   CHOLECYSTECTOMY     COLONOSCOPY  12/18/2011   Procedure: COLONOSCOPY;  Surgeon: Hart Carwin, MD;  Location: [facility];  Service: Endoscopy;  Laterality: N/A;  COLONOSCOPY WITH PROPOFOL N/A 08/03/2017   Procedure: COLONOSCOPY WITH PROPOFOL;  Surgeon: Malissa Hippo, MD;  Location: [facility];  Service: Endoscopy;  Laterality: N/A;   CYSTOSCOPY W/ URETERAL STENT PLACEMENT Bilateral 08/18/2021   Procedure: CYSTOSCOPY WITH  BILATERAL RETROGRADE  PYELOGRAM BILATERAL Otilio Miu STENT PLACEMENT;  Surgeon: Malen Gauze, MD;  Location: [facility];  Service: Urology;  Laterality: Bilateral;   CYSTOSCOPY WITH RETROGRADE PYELOGRAM, URETEROSCOPY AND STENT PLACEMENT Bilateral 09/01/2021   Procedure: CYSTOSCOPY WITH  bilateral RETROGRADE PYELOGRAM, URETEROSCOPY AND  bilateral STENT exchange;  Surgeon: Malen Gauze, MD;  Location: [facility];  Service: Urology;  Laterality: Bilateral;   CYSTOSCOPY/RETROGRADE/URETEROSCOPY/STONE EXTRACTION WITH BASKET Bilateral 04/18/2018   Procedure: CYSTOSCOPY/BILATERAL RETROGRADE/URETEROSCOPY/STONE EXTRACTION WITH BASKET WITH RIGHT STENT PLACEMENT;  Surgeon: Malen Gauze, MD;  Location: [facility];  Service: Urology;  Laterality: Bilateral;  1 HR   ESOPHAGEAL DILATION N/A 08/03/2017   Procedure: ESOPHAGEAL DILATION;  Surgeon: Malissa Hippo, MD;  [facility]: [facility];  Service: Endoscopy;  Laterality: N/A;   ESOPHAGOGASTRODUODENOSCOPY  12/18/2011   Procedure: ESOPHAGOGASTRODUODENOSCOPY (EGD);  Surgeon: Hart Carwin, MD;  Location: [facility];  Service: Endoscopy;  Laterality: N/A;   ESOPHAGOGASTRODUODENOSCOPY (EGD) WITH PROPOFOL N/A 08/03/2017   Procedure: ESOPHAGOGASTRODUODENOSCOPY (EGD) WITH PROPOFOL;  Surgeon: Malissa Hippo, MD;  [facility];  Service: Endoscopy;  Laterality: N/A;  pt knows to arrive at 6:15   HERNIA REPAIR     HOLMIUM LASER APPLICATION Right 04/18/2018   Procedure: HOLMIUM LASER APPLICATION;  Surgeon: Malen Gauze, MD;  Location: [facility];  Service: Urology;  Laterality: Right;   HOLMIUM LASER APPLICATION Bilateral 09/01/2021   Procedure: HOLMIUM LASER APPLICATION;  Surgeon: Malen Gauze, MD;  Location: [facility];  Service: Urology;  Laterality: Bilateral;   LUMBAR LAMINECTOMY FOR EPIDURAL ABSCESS Left 09/22/2013   Procedure: LUMBAR LAMINECTOMY FOR EPIDURAL ABSCESS LEFT LUMBAR FIVE-SACRAL ONE;  Surgeon: Temple Pacini, MD;  Location: [facility];  Service: Neurosurgery;   Laterality: Left;   RADIOLOGY WITH ANESTHESIA N/A 08/13/2014   Procedure: RADIOLOGY WITH ANESTHESIA ;  Surgeon: Medication Radiologist, MD;  Location: [facility];  Service: Radiology;  Laterality: N/A;   URETERAL STENT PLACEMENT     VERTEBROPLASTY N/A 07/14/2013   Procedure: VERTEBROPLASTY WITH LUMBAR THREE BIOPSY;  Surgeon: Temple Pacini, MD;  Location: [facility];  Service: Neurosurgery;  Laterality: N/A;  VERTEBROPLASTY WITH LUMBAR THREE BIOPSY    Social History:  reports that she has never smoked. She has been exposed to tobacco smoke. She has never used smokeless tobacco. She reports that she does not currently use alcohol. She reports that she does not use drugs.   Allergies  Allergen Reactions   Chlorhexidine Gluconate Itching    Pt c/o itching after abd wipes.    Morphine And Codeine Nausea And Vomiting    Headache   Tetracycline Nausea Only    Family History  Problem Relation Age of Onset   Breast cancer Sister 10   Breast cancer Other 19       aunt   Hypertension Mother    COPD Mother    COPD Father    Hypertension Sister    Aortic aneurysm Sister    Hypertension Son      Prior to Admission medications   Medication Sig Start Date End Date Taking? Authorizing Provider  azaTHIOprine (IMURAN) 50 MG tablet TAKE 2 TABLETS BY MOUTH DAILY 11/24/22   Rice, Jamesetta Orleans, MD  HYDROcodone-acetaminophen (NORCO/VICODIN)  5-325 MG tablet Take 1 tablet by mouth every 6 (six) hours as needed (severe pain.). 03/03/19   [provider]  hydrocortisone 1 % ointment Apply 1 application topically 2 (two) times daily. Patient taking differently: Apply 1 application  topically 2 (two) times daily as needed (skin irritation.). 09/06/20   Fuller Plan, MD  ibuprofen (ADVIL) 200 MG tablet Take by mouth. 08/10/17   [provider]  ibuprofen (ADVIL) 600 MG tablet Take 1 tablet by mouth 4 (four) times daily as needed.    [provider]  omeprazole (PRILOSEC) 20  MG capsule Take 20 mg by mouth daily before breakfast.    [provider]  oxyCODONE (OXYCONTIN) 20 mg 12 hr tablet Take 20 mg by mouth in the morning.    [provider]  predniSONE (DELTASONE) 5 MG tablet TAKE 1 TABLET BY MOUTH DAILY WITH BREAKFAST 12/13/22   Rice, Jamesetta Orleans, MD  RELISTOR 150 MG TABS Take 450 mg by mouth in the morning. 12/05/22   [provider]  rosuvastatin (CRESTOR) 5 MG tablet Take 5 mg by mouth once a week. 07/17/22   [provider]  valsartan-hydrochlorothiazide (DIOVAN-HCT) 80-12.5 MG tablet Take 1 tablet by mouth daily.    [provider]  Vitamin D, Ergocalciferol, (DRISDOL) 1.25 MG (50000 UNIT) CAPS capsule TAKE 1 CAPSULE BY MOUTH ONCE A WEEK 09/12/21   Doreatha Massed, MD    Physical Exam: BP 104/66 (BP Location: Right Arm)   Pulse 66   Temp 97.8 F (36.6 C) (Oral)   Resp 16   Ht 5\' 1"  (1.549 m)   Wt 70.3 kg   SpO2 99%   BMI 29.29 kg/m   General: 72 y.o. year-old female well developed well nourished in no acute distress.  Alert and oriented x3. HEENT: NCAT, EOMI.  Posterior oropharynx found to be erythematous.  Erythema of external auditory meatus with an intact left tympanic membrane noted.  Dry mucous membrane. Neck: Supple, trachea medial Cardiovascular: Regular rate and rhythm with no rubs or gallops.  No thyromegaly or JVD noted.  No lower extremity edema. 2/4 pulses in all 4 extremities. Respiratory: Clear to auscultation with no wheezes or rales. Good inspiratory effort. Abdomen: Soft, tender to palpation of left CVA.  Nondistended with normal bowel sounds x4 quadrants. Muskuloskeletal: No cyanosis, clubbing or edema noted bilaterally Neuro: CN II-XII intact, strength 5/5 x 4, sensation, reflexes intact Skin: No ulcerative lesions noted or rashes Psychiatry: Judgement and insight appear normal. Mood is appropriate for condition and setting          Labs on Admission:  Basic Metabolic  Panel: Recent Labs  Lab 01/26/23 1334  NA 137  K 3.3*  CL 100  CO2 27  GLUCOSE 111*  BUN 28*  CREATININE 1.59*  CALCIUM 10.3   Liver Function Tests: Recent Labs  Lab 01/26/23 1334  AST 10*  ALT 6  ALKPHOS 59  BILITOT 1.2*  PROT 7.1  ALBUMIN 3.2*   No results for input(s): "LIPASE", "AMYLASE" in the last 168 hours. No results for input(s): "AMMONIA" in the last 168 hours. CBC: Recent Labs  Lab 01/26/23 1334  WBC 13.2*  HGB 12.4  HCT 37.5  MCV 91.7  PLT 287   Cardiac Enzymes: No results for input(s): "CKTOTAL", "CKMB", "CKMBINDEX", "TROPONINI" in the last 168 hours.  BNP (last 3 results) No results for input(s): "BNP" in the last 8760 hours.  ProBNP (last 3 results) No results for input(s): "PROBNP" in the  last 8760 hours.  CBG: No results for input(s): "GLUCAP" in the last 168 hours.  Radiological Exams on Admission: CT Renal Stone Study  Result Date: 01/26/2023 CLINICAL DATA:  Left flank pain EXAM: CT ABDOMEN AND PELVIS WITHOUT CONTRAST TECHNIQUE: Multidetector CT imaging of the abdomen and pelvis was performed following the standard protocol without IV contrast. RADIATION DOSE REDUCTION: This exam was performed according to the departmental dose-optimization program which includes automated exposure control, adjustment of the mA and/or kV according to patient size and/or use of iterative reconstruction technique. COMPARISON:  07/11/2022 FINDINGS: Lower chest: Patchy airspace opacity within the left lower lobe and lingula including a 2.9 cm rounded pleural based opacity with configuration favoring rounded atelectasis. Mild scattered ground-glass opacity in the right lower lobe. Heart size is normal. Hepatobiliary: No focal liver abnormality is seen. Status post cholecystectomy. No biliary dilatation. Pancreas: Pancreatic atrophy. No ductal dilatation or fat stranding. Spleen: Normal in size without focal abnormality. Adrenals/Urinary Tract: Unremarkable adrenal  glands. Numerous punctate 1-2 mm stones within both kidneys. Severe left and moderate right hydronephrosis. No ureteral dilation. No ureteral calculi. Urinary bladder is decompressed. Stomach/Bowel: Stomach is within normal limits. Appendix appears normal. No evidence of bowel wall thickening, distention, or inflammatory changes. Vascular/Lymphatic: Aortic atherosclerosis. No enlarged abdominal or pelvic lymph nodes. Reproductive: Uterus and bilateral adnexa are unremarkable. Other: No free fluid. No abdominopelvic fluid collection. No pneumoperitoneum. No abdominal wall hernia. Musculoskeletal: Chronic L1 and L3 compression fractures. Prior L3 cement augmentation. Unchanged grade 1-2 anterolisthesis at L5-S1. No new or acute bony abnormality. IMPRESSION: 1. Numerous punctate bilateral renal calculi. Severe left and moderate right hydronephrosis, similar to the prior study. Consider chronic proximal ureteral obstructions, possibly secondary to retroperitoneal fibrosis. 2. Patchy airspace opacity within the left lower lobe and lingula including a 2.9 cm rounded pleural based opacity with configuration favoring rounded atelectasis. Mild scattered ground-glass opacity in the right lower lobe. Findings may represent a combination of atelectasis and infection. Follow-up CT of the chest in 3 months is recommended to assess for resolution. 3. Aortic atherosclerosis (ICD10-I70.0). Electronically Signed   By: Duanne Guess D.O.   On: 01/26/2023 17:18   DG Chest Portable 1 View  Result Date: 01/26/2023 CLINICAL DATA:  Left flank pain and vomiting associated with productive cough EXAM: PORTABLE CHEST 1 VIEW COMPARISON:  Chest radiograph dated 09/20/2013 FINDINGS: Normal lung volumes. Left basilar hazy and linear opacities. Nodular opacities projecting over the lateral bilateral apices, left-greater-than-right. Questionable blunting of the left costophrenic angle. No pneumothorax. Similar mildly enlarged  cardiomediastinal silhouette. No acute osseous abnormality. IMPRESSION: 1. Left basilar hazy and linear opacities, which may reflect atelectasis, aspiration, or pneumonia 2. Nodular opacities projecting over the lateral bilateral apices, left-greater-than-right, which may represent pulmonary nodules. Recommend further evaluation with chest CT. 3. Questionable blunting of the left costophrenic angle, which may represent a small pleural effusion or pleural thickening. Electronically Signed   By: Agustin Cree M.D.   On: 01/26/2023 17:14    EKG: I independently viewed the EKG done and my findings are as followed: EKG was not done in the ED  Assessment/Plan Present on Admission:  Right lower lobe pneumonia  Nausea with vomiting  Hypokalemia  Gastroesophageal reflux disease  Principal Problem:   Right lower lobe pneumonia Active Problems:   Hypokalemia   Nausea with vomiting   Gastroesophageal reflux disease   Acute pyelonephritis   Otitis externa   Acute kidney injury superimposed on CKD (HCC)   Dehydration   Hypoalbuminemia due  to protein-calorie malnutrition (HCC)   Mixed hyperlipidemia  CAP POA Chest x-ray and CT abdomen and pelvis were suggestive of right lower lobe pneumonia PORT/PSI of  92 points  indicating 8.2-9.3% mortality Continue Mucinex, ceftriaxone, azithromycin Continue  incentive spirometry, flutter valve and chest PT Blood culture and sputum culture pending  Acute pyelonephritis Continue IV ceftriaxone Urine culture culture pending  Left otitis external Continue IV ceftriaxone Continue Tylenol as needed for pain  Nausea and vomiting Continue Zofran as needed  Hypokalemia K+ 3.3, this will be replenished  Acute kidney injury on CKD 3B Dehydration BUN/creatinine 20/1.59 (baseline creatinine at 1.0-1.2) Continue gentle hydration Renally adjust medications, avoid nephrotoxic agents/dehydration/hypotension  Hypoalbuminemia possibly secondary to mild protein  calorie malnutrition Albumin 3.2, protein supplement will be provided  GERD Continue Protonix  Mixed hyperlipidemia Continue Crestor  DVT prophylaxis: Heparin subcu  Code Status: Full code  Family Communication: Sister at bedside (all questions answered to satisfaction)  Consults: None  Severity of Illness: The appropriate patient status for this patient is INPATIENT. Inpatient status is judged to be reasonable and necessary in order to provide the required intensity of service to ensure the patient's safety. The patient's presenting symptoms, physical exam findings, and initial radiographic and laboratory data in the context of their chronic comorbidities is felt to place them at high risk for further clinical deterioration. Furthermore, it is not anticipated that the patient will be medically stable for discharge from the hospital within 2 midnights of admission.   * I certify that at the point of admission it is my clinical judgment that the patient will require inpatient hospital care spanning beyond 2 midnights from the point of admission due to high intensity of service, high risk for further deterioration and high frequency of surveillance required.*  Author: Frankey Shown, DO 01/26/2023 9:17 PM  For on call review [email or web address].

## 2023-01-27 DIAGNOSIS — K219 Gastro-esophageal reflux disease without esophagitis: Secondary | ICD-10-CM | POA: Diagnosis not present

## 2023-01-27 DIAGNOSIS — J189 Pneumonia, unspecified organism: Secondary | ICD-10-CM | POA: Diagnosis not present

## 2023-01-27 DIAGNOSIS — N10 Acute tubulo-interstitial nephritis: Secondary | ICD-10-CM | POA: Diagnosis not present

## 2023-01-27 DIAGNOSIS — N189 Chronic kidney disease, unspecified: Secondary | ICD-10-CM

## 2023-01-27 DIAGNOSIS — N179 Acute kidney failure, unspecified: Secondary | ICD-10-CM | POA: Diagnosis not present

## 2023-01-27 LAB — COMPREHENSIVE METABOLIC PANEL
ALT: 7 U/L (ref 0–44)
AST: 9 U/L — ABNORMAL LOW (ref 15–41)
Albumin: 2.6 g/dL — ABNORMAL LOW (ref 3.5–5.0)
Alkaline Phosphatase: 54 U/L (ref 38–126)
Anion gap: 8 (ref 5–15)
BUN: 31 mg/dL — ABNORMAL HIGH (ref 8–23)
CO2: 26 mmol/L (ref 22–32)
Calcium: 9.4 mg/dL (ref 8.9–10.3)
Chloride: 103 mmol/L (ref 98–111)
Creatinine, Ser: 1.59 mg/dL — ABNORMAL HIGH (ref 0.44–1.00)
GFR, Estimated: 34 mL/min — ABNORMAL LOW (ref >60)
Glucose, Bld: 90 mg/dL (ref 70–99)
Potassium: 3.7 mmol/L (ref 3.5–5.1)
Sodium: 137 mmol/L (ref 135–145)
Total Bilirubin: 0.8 mg/dL (ref <1.2)
Total Protein: 5.9 g/dL — ABNORMAL LOW (ref 6.5–8.1)

## 2023-01-27 LAB — PHOSPHORUS: Phosphorus: 2.7 mg/dL (ref 2.5–4.6)

## 2023-01-27 LAB — URINALYSIS, W/ REFLEX TO CULTURE (INFECTION SUSPECTED)
Bilirubin Urine: NEGATIVE
Glucose, UA: NEGATIVE mg/dL
Ketones, ur: NEGATIVE mg/dL
Nitrite: NEGATIVE
Protein, ur: 30 mg/dL — AB
Specific Gravity, Urine: 1.016 (ref 1.005–1.030)
pH: 5 (ref 5.0–8.0)

## 2023-01-27 LAB — STREP PNEUMONIAE URINARY ANTIGEN: Strep Pneumo Urinary Antigen: NEGATIVE

## 2023-01-27 LAB — CBC
HCT: 31.9 % — ABNORMAL LOW (ref 36.0–46.0)
Hemoglobin: 10.3 g/dL — ABNORMAL LOW (ref 12.0–15.0)
MCH: 30.2 pg (ref 26.0–34.0)
MCHC: 32.3 g/dL (ref 30.0–36.0)
MCV: 93.5 fL (ref 80.0–100.0)
Platelets: 240 10*3/uL (ref 150–400)
RBC: 3.41 MIL/uL — ABNORMAL LOW (ref 3.87–5.11)
RDW: 14 % (ref 11.5–15.5)
WBC: 10.4 10*3/uL (ref 4.0–10.5)
nRBC: 0 % (ref 0.0–0.2)

## 2023-01-27 LAB — MAGNESIUM: Magnesium: 1.8 mg/dL (ref 1.7–2.4)

## 2023-01-27 MED ORDER — PANTOPRAZOLE SODIUM 40 MG PO TBEC
40.0000 mg | DELAYED_RELEASE_TABLET | Freq: Every day | ORAL | Status: DC
  Administered 2023-01-27 – 2023-01-28 (×2): 40 mg via ORAL
  Filled 2023-01-27 (×2): qty 1

## 2023-01-27 MED ORDER — ROSUVASTATIN CALCIUM 10 MG PO TABS
5.0000 mg | ORAL_TABLET | ORAL | Status: DC
  Administered 2023-01-27: 5 mg via ORAL
  Filled 2023-01-27: qty 1

## 2023-01-27 MED ORDER — LACTATED RINGERS IV BOLUS
500.0000 mL | Freq: Once | INTRAVENOUS | Status: AC
  Administered 2023-01-27: 500 mL via INTRAVENOUS

## 2023-01-27 NOTE — Progress Notes (Signed)
PROGRESS NOTE  Autumn Duran, is a 72 y.o. female, DOB - Aug 02, 1950, HCW:[id number]  Admit date - 01/26/2023   Admitting Physician Frankey Shown, DO  Outpatient Primary MD for the patient is Ignatius Specking, MD  LOS - 1  Chief Complaint  Patient presents with   Flank Pain   Cough      Brief Narrative:  72 y.o. female with medical history significant of retroperitoneal fibrosis, GERD, hyperlipidemia, nephrolithiasis, severe left and moderate right hydronephrosis admitted on 01/26/2023 with sepsis secondary to urinary and pulmonary source    -Assessment and Plan: 1) sepsis secondary to pulmonary and urinary source--POA -Patient met sepsis criteria on admission with tachypnea, and leukocytosis in the setting of presumed pneumonia UTI -Patient had transient hypotension and hypoxia resolved with IV fluids 01/27/23 -Currently reports slight cough, no further fevers, reports urinary frequency but no significant dysuria at this time -Continue Rocephin and azithromycin any further culture data  2) acute pyelonephritis--- antibiotics as above #1 pending culture data  3)Rt LL PNA--continue Rocephin and azithromycin as above #1 -Continue bronchodilators mucolytic's, incentive spirometry advised  4)Acute kidney injury on CKD 3B----due to dehydration BUN/creatinine 20/1.59 (baseline creatinine at 1.0-1.2) Continue gentle hydration Renally adjust medications, avoid nephrotoxic agents/dehydration/hypotension  5)HLD--continue Crestor  6)GERD--stable, continue Protonix  7)Retroperitoneal Fibrosis--- CT renal protocol shows Severe left and moderate right hydronephrosis, similar to the prior study.  Possible chronic proximal ureteral obstructions, possibly secondary to retroperitoneal fibrosis. --patient follows up with rheumatologist and urologist   Status is: Inpatient   Disposition: The patient is from: Home              Anticipated d/c is to: Home              Anticipated d/c date  is: 1 day              Patient currently is not medically stable to d/c. Barriers: Not Clinically Stable-   Code Status :  -  Code Status: Full Code   Family Communication:    NA (patient is alert, awake and coherent)   DVT Prophylaxis  :   - SCDs  heparin injection 5,000 Units Start: 01/26/23 2200 SCDs Start: 01/26/23 2018   Lab Results  Component Value Date   PLT 240 01/27/2023    Inpatient Medications  Scheduled Meds:  dextromethorphan-guaiFENesin  1 tablet Oral BID   feeding supplement  237 mL Oral BID BM   heparin  5,000 Units Subcutaneous Q8H   pantoprazole  40 mg Oral Daily   rosuvastatin  5 mg Oral Weekly   sodium chloride flush  10 mL Intravenous Q12H   Continuous Infusions:  azithromycin Stopped (01/26/23 1953)   cefTRIAXone (ROCEPHIN)  IV 2 g (01/27/23 0837)   PRN Meds:.acetaminophen **OR** acetaminophen, ondansetron **OR** ondansetron (ZOFRAN) IV   Anti-infectives (From admission, onward)    Start     Dose/Rate Route Frequency Ordered Stop   01/27/23 1000  cefTRIAXone (ROCEPHIN) 2 g in sodium chloride 0.9 % 100 mL IVPB        2 g 200 mL/hr over 30 Minutes Intravenous Every 24 hours 01/26/23 2018 01/31/23 0959   01/26/23 1800  cefTRIAXone (ROCEPHIN) 1 g in sodium chloride 0.9 % 100 mL IVPB        1 g 200 mL/hr over 30 Minutes Intravenous  Once 01/26/23 1757 01/26/23 1838   01/26/23 1800  azithromycin (ZITHROMAX) 500 mg in sodium chloride 0.9 % 250 mL IVPB  500 mg 250 mL/hr over 60 Minutes Intravenous Every 24 hours 01/26/23 1757         Subjective: Autumn Duran today has no fevers, no emesis,  No chest pain,   -Currently reports slight cough, no further fevers, reports urinary frequency but no significant dysuria at this time -No frank hematuria   Objective: Vitals:   01/27/23 0153 01/27/23 0356 01/27/23 0518 01/27/23 0831  BP: (!) 87/48 (!) 92/52 (!) 94/54 (!) 106/49  Pulse: (!) 51 (!) 52 (!) 51 63  Resp: 18 18    Temp:      TempSrc:       SpO2:      Weight:      Height:        Intake/Output Summary (Last 24 hours) at 01/27/2023 1108 Last data filed at 01/26/2023 1953 Gross per 24 hour  Intake 252.4 ml  Output --  Net 252.4 ml   Filed Weights   01/26/23 1308 01/26/23 2046  Weight: 70.3 kg 69.2 kg    Physical Exam  Gen:- Awake Alert, in no acute distress HEENT:- Twilight.AT, No sclera icterus Neck-Supple Neck,No JVD,.  Lungs-no wheezing, fair symmetrical air movement CV- S1, S2 normal, regular  Abd-  +ve B.Sounds, Abd Soft, No tenderness, no CVA tenderness Extremity/Skin:- No  edema, pedal pulses present  Psych-affect is appropriate, oriented x3 Neuro-no new focal deficits, no tremors  Data Reviewed: I have personally reviewed following labs and imaging studies  CBC: Recent Labs  Lab 01/26/23 1334 01/27/23 0621  WBC 13.2* 10.4  HGB 12.4 10.3*  HCT 37.5 31.9*  MCV 91.7 93.5  PLT 287 240   Basic Metabolic Panel: Recent Labs  Lab 01/26/23 1334 01/27/23 0621  NA 137 137  K 3.3* 3.7  CL 100 103  CO2 27 26  GLUCOSE 111* 90  BUN 28* 31*  CREATININE 1.59* 1.59*  CALCIUM 10.3 9.4  MG  --  1.8  PHOS  --  2.7   GFR: Estimated Creatinine Clearance: 28.5 mL/min (A) (by C-G formula based on SCr of 1.59 mg/dL (H)). Liver Function Tests: Recent Labs  Lab 01/26/23 1334 01/27/23 0621  AST 10* 9*  ALT 6 7  ALKPHOS 59 54  BILITOT 1.2* 0.8  PROT 7.1 5.9*  ALBUMIN 3.2* 2.6*   Recent Results (from the past 240 hour(s))  Resp panel by RT-PCR (RSV, Flu A&B, Covid) Anterior Nasal Swab     Status: None   Collection Time: 01/26/23  5:12 PM   Specimen: Anterior Nasal Swab  Result Value Ref Range Status   SARS Coronavirus 2 by RT PCR NEGATIVE NEGATIVE Final    Comment: (NOTE) SARS-CoV-2 target nucleic acids are NOT DETECTED.  The SARS-CoV-2 RNA is generally detectable in upper respiratory specimens during the acute phase of infection. The lowest concentration of SARS-CoV-2 viral copies this assay can  detect is 138 copies/mL. A negative result does not preclude SARS-Cov-2 infection and should not be used as the sole basis for treatment or other patient management decisions. A negative result may occur with  improper specimen collection/handling, submission of specimen other than nasopharyngeal swab, presence of viral mutation(s) within the areas targeted by this assay, and inadequate number of viral copies(<138 copies/mL). A negative result must be combined with clinical observations, patient history, and epidemiological information. The expected result is Negative.  Fact Sheet for Patients:  BloggerCourse.com  Fact Sheet for Healthcare Providers:  SeriousBroker.it  This test is no t yet approved or cleared by the [place]  FDA and  has been authorized for detection and/or diagnosis of SARS-CoV-2 by FDA under an Emergency Use Authorization (EUA). This EUA will remain  in effect (meaning this test can be used) for the duration of the COVID-19 declaration under Section 564(b)(1) of the Act, 21 U.S.C.section 360bbb-3(b)(1), unless the authorization is terminated  or revoked sooner.       Influenza A by PCR NEGATIVE NEGATIVE Final   Influenza B by PCR NEGATIVE NEGATIVE Final    Comment: (NOTE) The Xpert Xpress SARS-CoV-2/FLU/RSV plus assay is intended as an aid in the diagnosis of influenza from Nasopharyngeal swab specimens and should not be used as a sole basis for treatment. Nasal washings and aspirates are unacceptable for Xpert Xpress SARS-CoV-2/FLU/RSV testing.  Fact Sheet for Patients: BloggerCourse.com  Fact Sheet for Healthcare Providers: SeriousBroker.it  This test is not yet approved or cleared by the [place] FDA and has been authorized for detection and/or diagnosis of SARS-CoV-2 by FDA under an Emergency Use Authorization (EUA). This EUA will remain in  effect (meaning this test can be used) for the duration of the COVID-19 declaration under Section 564(b)(1) of the Act, 21 U.S.C. section 360bbb-3(b)(1), unless the authorization is terminated or revoked.     Resp Syncytial Virus by PCR NEGATIVE NEGATIVE Final    Comment: (NOTE) Fact Sheet for Patients: BloggerCourse.com  Fact Sheet for Healthcare Providers: SeriousBroker.it  This test is not yet approved or cleared by the [place] FDA and has been authorized for detection and/or diagnosis of SARS-CoV-2 by FDA under an Emergency Use Authorization (EUA). This EUA will remain in effect (meaning this test can be used) for the duration of the COVID-19 declaration under Section 564(b)(1) of the Act, 21 U.S.C. section 360bbb-3(b)(1), unless the authorization is terminated or revoked.  Performed at [facility], [address], [place], [place] [address]   Blood culture (routine x 2)     Status: None (Preliminary result)   Collection Time: 01/26/23  6:54 PM   Specimen: Left Antecubital; Blood  Result Value Ref Range Status   Specimen Description LEFT ANTECUBITAL  Final   Special Requests   Final    BOTTLES DRAWN AEROBIC AND ANAEROBIC Blood Culture adequate volume   Culture   Final    NO GROWTH < 12 HOURS Performed at [facility], [address], [place], [place] [address]    Report Status PENDING  Incomplete  Blood culture (routine x 2)     Status: None (Preliminary result)   Collection Time: 01/26/23  7:02 PM   Specimen: Left Antecubital; Blood  Result Value Ref Range Status   Specimen Description LEFT ANTECUBITAL  Final   Special Requests   Final    BOTTLES DRAWN AEROBIC AND ANAEROBIC Blood Culture adequate volume   Culture   Final    NO GROWTH < 12 HOURS Performed at [facility], [address] [place], [place] [address]    Report Status PENDING  Incomplete   Radiology Studies: CT Renal Stone Study  Result Date:  01/26/2023 CLINICAL DATA:  Left flank pain EXAM: CT ABDOMEN AND PELVIS WITHOUT CONTRAST TECHNIQUE: Multidetector CT imaging of the abdomen and pelvis was performed following the standard protocol without IV contrast. RADIATION DOSE REDUCTION: This exam was performed according to the departmental dose-optimization program which includes automated exposure control, adjustment of the mA and/or kV according to patient size and/or use of iterative reconstruction technique. COMPARISON:  07/11/2022 FINDINGS: Lower chest: Patchy airspace opacity within the left lower lobe and  lingula including a 2.9 cm rounded pleural based opacity with configuration favoring rounded atelectasis. Mild scattered ground-glass opacity in the right lower lobe. Heart size is normal. Hepatobiliary: No focal liver abnormality is seen. Status post cholecystectomy. No biliary dilatation. Pancreas: Pancreatic atrophy. No ductal dilatation or fat stranding. Spleen: Normal in size without focal abnormality. Adrenals/Urinary Tract: Unremarkable adrenal glands. Numerous punctate 1-2 mm stones within both kidneys. Severe left and moderate right hydronephrosis. No ureteral dilation. No ureteral calculi. Urinary bladder is decompressed. Stomach/Bowel: Stomach is within normal limits. Appendix appears normal. No evidence of bowel wall thickening, distention, or inflammatory changes. Vascular/Lymphatic: Aortic atherosclerosis. No enlarged abdominal or pelvic lymph nodes. Reproductive: Uterus and bilateral adnexa are unremarkable. Other: No free fluid. No abdominopelvic fluid collection. No pneumoperitoneum. No abdominal wall hernia. Musculoskeletal: Chronic L1 and L3 compression fractures. Prior L3 cement augmentation. Unchanged grade 1-2 anterolisthesis at L5-S1. No new or acute bony abnormality. IMPRESSION: 1. Numerous punctate bilateral renal calculi. Severe left and moderate right hydronephrosis, similar to the prior study. Consider chronic proximal  ureteral obstructions, possibly secondary to retroperitoneal fibrosis. 2. Patchy airspace opacity within the left lower lobe and lingula including a 2.9 cm rounded pleural based opacity with configuration favoring rounded atelectasis. Mild scattered ground-glass opacity in the right lower lobe. Findings may represent a combination of atelectasis and infection. Follow-up CT of the chest in 3 months is recommended to assess for resolution. 3. Aortic atherosclerosis (ICD10-I70.0). Electronically Signed   By: Duanne Guess D.O.   On: 01/26/2023 17:18   DG Chest Portable 1 View  Result Date: 01/26/2023 CLINICAL DATA:  Left flank pain and vomiting associated with productive cough EXAM: PORTABLE CHEST 1 VIEW COMPARISON:  Chest radiograph dated 09/20/2013 FINDINGS: Normal lung volumes. Left basilar hazy and linear opacities. Nodular opacities projecting over the lateral bilateral apices, left-greater-than-right. Questionable blunting of the left costophrenic angle. No pneumothorax. Similar mildly enlarged cardiomediastinal silhouette. No acute osseous abnormality. IMPRESSION: 1. Left basilar hazy and linear opacities, which may reflect atelectasis, aspiration, or pneumonia 2. Nodular opacities projecting over the lateral bilateral apices, left-greater-than-right, which may represent pulmonary nodules. Recommend further evaluation with chest CT. 3. Questionable blunting of the left costophrenic angle, which may represent a small pleural effusion or pleural thickening. Electronically Signed   By: Agustin Cree M.D.   On: 01/26/2023 17:14    Scheduled Meds:  dextromethorphan-guaiFENesin  1 tablet Oral BID   feeding supplement  237 mL Oral BID BM   heparin  5,000 Units Subcutaneous Q8H   pantoprazole  40 mg Oral Daily   rosuvastatin  5 mg Oral Weekly   sodium chloride flush  10 mL Intravenous Q12H   Continuous Infusions:  azithromycin Stopped (01/26/23 1953)   cefTRIAXone (ROCEPHIN)  IV 2 g (01/27/23 0837)     LOS: 1 day   Autumn Duran M.D on 01/27/2023 at 11:08 AM  Go to [email or web address] - for contact info  [facility] - Office  [phone]  If 7PM-7AM, please contact night-coverage [email or web address] 01/27/2023, 11:08 AM

## 2023-01-27 NOTE — Progress Notes (Signed)
   01/27/23 1117  TOC Brief Assessment  Insurance and Status Reviewed  Patient has primary care physician Yes  Home environment has been reviewed from home alone  Prior level of function: indep  Prior/Current Home Services No current home services  Social Determinants of Health Reivew SDOH reviewed no interventions necessary  Readmission risk has been reviewed Yes  Transition of care needs no transition of care needs at this time

## 2023-01-27 NOTE — Plan of Care (Signed)

## 2023-01-28 ENCOUNTER — Other Ambulatory Visit: Payer: Self-pay | Admitting: Internal Medicine

## 2023-01-28 DIAGNOSIS — K219 Gastro-esophageal reflux disease without esophagitis: Secondary | ICD-10-CM | POA: Diagnosis not present

## 2023-01-28 DIAGNOSIS — K682 Retroperitoneal fibrosis: Secondary | ICD-10-CM

## 2023-01-28 DIAGNOSIS — N179 Acute kidney failure, unspecified: Secondary | ICD-10-CM | POA: Diagnosis not present

## 2023-01-28 DIAGNOSIS — N189 Chronic kidney disease, unspecified: Secondary | ICD-10-CM | POA: Diagnosis not present

## 2023-01-28 DIAGNOSIS — J189 Pneumonia, unspecified organism: Secondary | ICD-10-CM | POA: Diagnosis not present

## 2023-01-28 LAB — BASIC METABOLIC PANEL
Anion gap: 9 (ref 5–15)
BUN: 18 mg/dL (ref 8–23)
CO2: 28 mmol/L (ref 22–32)
Calcium: 9.6 mg/dL (ref 8.9–10.3)
Chloride: 104 mmol/L (ref 98–111)
Creatinine, Ser: 1.13 mg/dL — ABNORMAL HIGH (ref 0.44–1.00)
GFR, Estimated: 52 mL/min — ABNORMAL LOW (ref >60)
Glucose, Bld: 72 mg/dL (ref 70–99)
Potassium: 3.9 mmol/L (ref 3.5–5.1)
Sodium: 141 mmol/L (ref 135–145)

## 2023-01-28 LAB — MRSA NEXT GEN BY PCR, NASAL: MRSA by PCR Next Gen: NOT DETECTED

## 2023-01-28 LAB — URINE CULTURE: Culture: NO GROWTH

## 2023-01-28 MED ORDER — HYDROCODONE-ACETAMINOPHEN 5-325 MG PO TABS
1.0000 | ORAL_TABLET | Freq: Three times a day (TID) | ORAL | Status: DC | PRN
  Administered 2023-01-28: 1 via ORAL
  Filled 2023-01-28: qty 1

## 2023-01-28 MED ORDER — AZITHROMYCIN 500 MG PO TABS
500.0000 mg | ORAL_TABLET | Freq: Every day | ORAL | 0 refills | Status: AC
Start: 2023-01-28 — End: 2023-01-31

## 2023-01-28 MED ORDER — BISACODYL 10 MG RE SUPP: 10.0000 mg | Freq: Once | RECTAL | Status: DC

## 2023-01-28 MED ORDER — SODIUM CHLORIDE 0.9 % IV BOLUS
500.0000 mL | Freq: Once | INTRAVENOUS | Status: AC
  Administered 2023-01-28: 500 mL via INTRAVENOUS

## 2023-01-28 MED ORDER — CEPHALEXIN 500 MG PO CAPS: 500.0000 mg | ORAL_CAPSULE | Freq: Three times a day (TID) | ORAL | 0 refills | Status: AC

## 2023-01-28 MED ORDER — ACETAMINOPHEN 325 MG PO TABS: 650.0000 mg | ORAL_TABLET | Freq: Four times a day (QID) | ORAL | Status: AC | PRN

## 2023-01-28 MED ORDER — LACTULOSE 10 GM/15ML PO SOLN
30.0000 g | Freq: Once | ORAL | Status: AC
  Administered 2023-01-28: 30 g via ORAL
  Filled 2023-01-28: qty 60

## 2023-01-28 MED ORDER — AZITHROMYCIN 250 MG PO TABS
500.0000 mg | ORAL_TABLET | Freq: Every day | ORAL | Status: DC
  Administered 2023-01-28: 500 mg via ORAL
  Filled 2023-01-28: qty 2

## 2023-01-28 NOTE — Plan of Care (Signed)
  Problem: Education: Goal: Knowledge of General Education information will improve Description: Including pain rating scale, medication(s)/side effects and non-pharmacologic comfort measures Outcome: Progressing   Problem: Health Behavior/Discharge Planning: Goal: Ability to manage health-related needs will improve Outcome: Progressing   Problem: Clinical Measurements: Goal: Will remain free from infection Outcome: Progressing Goal: Diagnostic test results will improve Outcome: Progressing Goal: Respiratory complications will improve Outcome: Progressing   Problem: Pain Management: Goal: General experience of comfort will improve Outcome: Not Met (add Reason) Note: Patient hasn't been receiving her oxycodone due to low BP. Tylenol hasn't helped much.

## 2023-01-28 NOTE — Plan of Care (Signed)

## 2023-01-28 NOTE — Progress Notes (Signed)
PHARMACIST - PHYSICIAN COMMUNICATION CONCERNING: Antibiotic IV to Oral Route Change Policy  RECOMMENDATION: This patient is receiving azithromycin intravenously. Based on criteria approved by the Pharmacy and Therapeutics Committee, the antibiotic(s) is/are being converted to the equivalent dose of an oral formulation.   DESCRIPTION: These criteria include: Patient being treated for a respiratory tract infection, urinary tract infection, cellulitis or Clostridioides difficile-associated diarrhea if on metronidazole. The patient is not neutropenic and does not exhibit a malabsorptive GI state. The patient is eating (either orally or via tube) and/or has been taking other orally administered medications for at least 24 hours. The patient is improving clinically and has a 24-hour Tmax of <100.5 F.  If you have questions about this conversion, please contact the Pharmacy Department:  []   [phone] )  [facility] [x]   [phone] )  [facility] []   [phone] )  [facility]  []   [phone] )  [facility]   Will M. Dareen Piano, PharmD Clinical Pharmacist 01/28/2023 2:55 PM

## 2023-01-28 NOTE — Discharge Summary (Signed)
Autumn Duran, is a 72 y.o. female  DOB November 23, 1950  MRN [id number].  Admission date:  01/26/2023  Admitting Physician  Frankey Shown, DO  Discharge Date:  01/28/2023   Primary MD  Ignatius Specking, MD  Recommendations for primary care physician for things to follow:  1) please take antibiotics as prescribed=--you already received a dose of antibiotic for today, start oral antibiotics on Monday, 01/29/2023 2)follow up with your primary care physician Doreen Beam B, MD in about a week or so for recheck and reevaluation  Admission Diagnosis  Pyelonephritis [N12] Right lower lobe pneumonia [J18.9] AKI (acute kidney injury) (HCC) [N17.9] Hydronephrosis, unspecified hydronephrosis type [N13.30] Pneumonia of right lower lobe due to infectious organism [J18.9] Other non-recurrent acute nonsuppurative otitis media of left ear [H65.192] CAP (community acquired pneumonia) [J18.9]   Discharge Diagnosis  Pyelonephritis [N12] Right lower lobe pneumonia [J18.9] AKI (acute kidney injury) (HCC) [N17.9] Hydronephrosis, unspecified hydronephrosis type [N13.30] Pneumonia of right lower lobe due to infectious organism [J18.9] Other non-recurrent acute nonsuppurative otitis media of left ear [H65.192] CAP (community acquired pneumonia) [J18.9]    Principal Problem:   Right lower lobe pneumonia Active Problems:   Hypokalemia   Nausea with vomiting   Gastroesophageal reflux disease   Acute pyelonephritis   Otitis externa   Acute kidney injury superimposed on CKD (HCC)   Dehydration   Hypoalbuminemia due to protein-calorie malnutrition (HCC)   Mixed hyperlipidemia   CAP (community acquired pneumonia)      Past Medical History:  Diagnosis Date   Bronchitis, chronic (HCC)    Cancer (HCC)    Chronic back pain    Chronic kidney disease    kidney stone s/p stent placement ( removed)   Complication of anesthesia     Compression fracture of L3 lumbar vertebra    Diverticulosis    Elbow fracture 10/2022   GERD (gastroesophageal reflux disease)    Hemorrhoids    Hiatal hernia    History of kidney stones    Hx of adenomatous colonic polyps    Hyperlipidemia    Hypertension    Lumbar radicular pain    Osteoporosis 12/10/2017   PONV (postoperative nausea and vomiting)    Sore throat    T7 vertebral fracture [facility])     Past Surgical History:  Procedure Laterality Date   BACK SURGERY     BONE MARROW ASPIRATION Left 02/2014   BONE MARROW BIOPSY Left 02/2014   CHOLECYSTECTOMY     COLONOSCOPY  12/18/2011   Procedure: COLONOSCOPY;  Surgeon: Hart Carwin, MD;  Location: [facility];  Service: Endoscopy;  Laterality: N/A;   COLONOSCOPY WITH PROPOFOL N/A 08/03/2017   Procedure: COLONOSCOPY WITH PROPOFOL;  Surgeon: Malissa Hippo, MD;  [facility];  Service: Endoscopy;  Laterality: N/A;   CYSTOSCOPY W/ URETERAL STENT PLACEMENT Bilateral 08/18/2021   Procedure: CYSTOSCOPY WITH  BILATERAL RETROGRADE PYELOGRAM BILATERAL Otilio Miu STENT PLACEMENT;  Surgeon: Malen Gauze, MD;  Location: [facility];  Service: Urology;  Laterality: Bilateral;  CYSTOSCOPY WITH RETROGRADE PYELOGRAM, URETEROSCOPY AND STENT PLACEMENT Bilateral 09/01/2021   Procedure: CYSTOSCOPY WITH  bilateral RETROGRADE PYELOGRAM, URETEROSCOPY AND  bilateral STENT exchange;  Surgeon: Malen Gauze, MD;  Location: [facility];  Service: Urology;  Laterality: Bilateral;   CYSTOSCOPY/RETROGRADE/URETEROSCOPY/STONE EXTRACTION WITH BASKET Bilateral 04/18/2018   Procedure: CYSTOSCOPY/BILATERAL RETROGRADE/URETEROSCOPY/STONE EXTRACTION WITH BASKET WITH RIGHT STENT PLACEMENT;  Surgeon: Malen Gauze, MD;  Location: [facility];  Service: Urology;  Laterality: Bilateral;  1 HR   ESOPHAGEAL DILATION N/A 08/03/2017   Procedure: ESOPHAGEAL DILATION;  Surgeon: Malissa Hippo, MD;  [facility];  Service: Endoscopy;  Laterality: N/A;    ESOPHAGOGASTRODUODENOSCOPY  12/18/2011   Procedure: ESOPHAGOGASTRODUODENOSCOPY (EGD);  Surgeon: Hart Carwin, MD;  Location: [facility];  Service: Endoscopy;  Laterality: N/A;   ESOPHAGOGASTRODUODENOSCOPY (EGD) WITH PROPOFOL N/A 08/03/2017   Procedure: ESOPHAGOGASTRODUODENOSCOPY (EGD) WITH PROPOFOL;  Surgeon: Malissa Hippo, MD;  [facility];  Service: Endoscopy;  Laterality: N/A;  pt knows to arrive at 6:15   HERNIA REPAIR     HOLMIUM LASER APPLICATION Right 04/18/2018   Procedure: HOLMIUM LASER APPLICATION;  Surgeon: Malen Gauze, MD;  [facility]: [facility];  Service: Urology;  Laterality: Right;   HOLMIUM LASER APPLICATION Bilateral 09/01/2021   Procedure: HOLMIUM LASER APPLICATION;  Surgeon: Malen Gauze, MD;  Location: [facility];  Service: Urology;  Laterality: Bilateral;   LUMBAR LAMINECTOMY FOR EPIDURAL ABSCESS Left 09/22/2013   Procedure: LUMBAR LAMINECTOMY FOR EPIDURAL ABSCESS LEFT LUMBAR FIVE-SACRAL ONE;  Surgeon: Temple Pacini, MD;  Location: [facility];  Service: Neurosurgery;  Laterality: Left;   RADIOLOGY WITH ANESTHESIA N/A 08/13/2014   Procedure: RADIOLOGY WITH ANESTHESIA ;  Surgeon: Medication Radiologist, MD;  Location: [facility];  Service: Radiology;  Laterality: N/A;   URETERAL STENT PLACEMENT     VERTEBROPLASTY N/A 07/14/2013   Procedure: VERTEBROPLASTY WITH LUMBAR THREE BIOPSY;  Surgeon: Temple Pacini, MD;  Location: [facility];  Service: Neurosurgery;  Laterality: N/A;  VERTEBROPLASTY WITH LUMBAR THREE BIOPSY       HPI  from the history and physical done on the day of admission:   HPI: Autumn Duran is a 72 y.o. female with medical history significant of retroperitoneal fibrosis, GERD, hyperlipidemia, nephrolithiasis, severe left and moderate right hydronephrosis who presents to the emergency department due to a 2-day onset of left-sided flank pain associated with nausea and nonbloody vomiting.  She also complained of headache which was associated  with sore throat, hoarseness, headache and left ear pain as well as subjective fever.  She endorsed having darker urine than normal, but denies any irritative bladder symptoms, chest pain, shortness of breath.   ED Course:  In the emergency department, she was hemodynamically stable, though BP was soft at 107/86.  Workup in the ED showed normal CBC except for leukocytosis.  BMP was normal except for potassium of 3.3, BUN/creatinine 20/1.59 (baseline creatinine at 1.0-1.2), lactic acid was normal.  Influenza A, B, SARS coronavirus 2, RSV was negative. Chest x-ray showed left basilar hazy and linear opacities, which may reflect atelectasis, aspiration or pneumonia.  Nodular opacities projecting over the lateral bilateral apices, left-greater-than-right, which may represent pulmonary nodules. Recommend further evaluation with chest CT. Questionable blunting of the left costophrenic angle, which may represent a small pleural effusion or pleural thickening. CT abdomen and pelvis without contrast showed numerous punctate bilateral renal calculi. Severe left and moderate right hydronephrosis, similar to the prior study. Consider chronic proximal ureteral obstructions,  possibly secondary to retroperitoneal fibrosis.  Patchy airspace opacity within the left lower lobe and lingula including a 2.9 cm rounded pleural based opacity with configuration favoring rounded atelectasis. Mild scattered ground-glass opacity in the right lower lobe. Findings may represent a combination of atelectasis and infection.  Urologist (Dr. Pete Glatter) was consulted and there was no indication for any surgical intervention at this time per EDP.  Patient was treated with IV ceftriaxone and azithromycin due to pneumonia as well as pyelonephritis.  IV LR 1 L was given, Zofran was provided and hospitalist was asked to admit patient for further evaluation and management.   Review of Systems: Review of systems as noted in the HPI. All other  systems reviewed and are negative.     Hospital Course:     Brief Narrative:  72 y.o. female with medical history significant of retroperitoneal fibrosis, GERD, hyperlipidemia, nephrolithiasis, severe left and moderate right hydronephrosis admitted on 01/26/2023 with sepsis secondary to urinary and pulmonary source     -Assessment and Plan: 1) sepsis secondary to pulmonary and urinary source--POA -Patient met sepsis criteria on admission with tachypnea, and leukocytosis in the setting of presumed pneumonia UTI -Patient had transient hypotension and hypoxia resolved with IV fluids  no further fevers,  -No further urinary frequency, no dysuria -Cough improving -Urine and blood cultures NGTD -Treated with Rocephin and azithromycin- -okay to discharge on Keflex and azithromycin   2) acute pyelonephritis--- antibiotics as above #1    3)Rt LL PNA--treated with Rocephin and azithromycin -Treated with bronchodilators and mucolytics -Respiratory symptoms improved- -okay to discharge on Keflex and azithromycin   4)Acute kidney injury on CKD 3B----due to dehydration BUN/creatinine 20/1.59 (baseline creatinine at 1.0-1.2) -Creatinine is down to 1.1 from 1.59 with hydration Renally adjust medications, avoid nephrotoxic agents/dehydration/hypotension   5)HLD--continue Crestor   6)GERD--stable, continue Protonix   7)Retroperitoneal Fibrosis--- CT renal protocol shows Severe left and moderate right hydronephrosis, similar to the prior study.  Possible chronic proximal ureteral obstructions, possibly secondary to retroperitoneal fibrosis. --patient follows up with rheumatologist and urologist   Disposition: The patient is from: Home              Anticipated d/c is to: Home  Discharge Condition: Stable  Follow UP   Follow-up Information     [name], Dhruv B, MD Follow up in 1 week(s).   Specialty: Internal Medicine Contact information: [address] [place] [address] [phone]                 Diet and Activity recommendation:  As advised  Discharge Instructions    Discharge Instructions     Call MD for:  difficulty breathing, headache or visual disturbances   Complete by: As directed    Call MD for:  persistant dizziness or light-headedness   Complete by: As directed    Call MD for:  persistant nausea and vomiting   Complete by: As directed    Call MD for:  temperature >100.4   Complete by: As directed    Diet - low sodium heart healthy   Complete by: As directed    Discharge instructions   Complete by: As directed    1) please take antibiotics as prescribed=--you already received a dose of antibiotic for today, start oral antibiotics on Monday, 01/29/2023 2)follow up with your primary care physician Doreen Beam B, MD in about a week or so for recheck and reevaluation   Increase activity slowly   Complete by: As directed  Discharge Medications     Allergies as of 01/28/2023       Reactions   Chlorhexidine Gluconate Itching   Pt c/o itching after abd wipes.    Morphine And Codeine Nausea And Vomiting   Headache   Tetracycline Nausea Only        Medication List     STOP taking these medications    ibuprofen 200 MG tablet Commonly known as: ADVIL       TAKE these medications    acetaminophen 325 MG tablet Commonly known as: TYLENOL Take 2 tablets (650 mg total) by mouth every 6 (six) hours as needed for mild pain (pain score 1-3) (or Fever >/= 101).   azaTHIOprine 50 MG tablet Commonly known as: IMURAN TAKE 2 TABLETS BY MOUTH DAILY   azithromycin 500 MG tablet Commonly known as: ZITHROMAX Take 1 tablet (500 mg total) by mouth daily for 3 days.   cephALEXin 500 MG capsule Commonly known as: Keflex Take 1 capsule (500 mg total) by mouth 3 (three) times daily for 5 days.   HYDROcodone-acetaminophen 5-325 MG tablet Commonly known as: NORCO/VICODIN Take 1 tablet by mouth every 6 (six) hours as  needed (severe pain.).   omeprazole 20 MG capsule Commonly known as: PRILOSEC Take 20 mg by mouth daily before breakfast.   oxyCODONE 20 mg 12 hr tablet Commonly known as: OXYCONTIN Take 20 mg by mouth in the morning.   predniSONE 5 MG tablet Commonly known as: DELTASONE TAKE 1 TABLET BY MOUTH DAILY WITH BREAKFAST What changed: how much to take   rosuvastatin 5 MG tablet Commonly known as: CRESTOR Take 5 mg by mouth once a week.   valsartan-hydrochlorothiazide 80-12.5 MG tablet Commonly known as: DIOVAN-HCT Take 1 tablet by mouth daily.        Major procedures and Radiology Reports - PLEASE review detailed and final reports for all details, in brief -  CT Renal Stone Study  Result Date: 01/26/2023 CLINICAL DATA:  Left flank pain EXAM: CT ABDOMEN AND PELVIS WITHOUT CONTRAST TECHNIQUE: Multidetector CT imaging of the abdomen and pelvis was performed following the standard protocol without IV contrast. RADIATION DOSE REDUCTION: This exam was performed according to the departmental dose-optimization program which includes automated exposure control, adjustment of the mA and/or kV according to patient size and/or use of iterative reconstruction technique. COMPARISON:  07/11/2022 FINDINGS: Lower chest: Patchy airspace opacity within the left lower lobe and lingula including a 2.9 cm rounded pleural based opacity with configuration favoring rounded atelectasis. Mild scattered ground-glass opacity in the right lower lobe. Heart size is normal. Hepatobiliary: No focal liver abnormality is seen. Status post cholecystectomy. No biliary dilatation. Pancreas: Pancreatic atrophy. No ductal dilatation or fat stranding. Spleen: Normal in size without focal abnormality. Adrenals/Urinary Tract: Unremarkable adrenal glands. Numerous punctate 1-2 mm stones within both kidneys. Severe left and moderate right hydronephrosis. No ureteral dilation. No ureteral calculi. Urinary bladder is decompressed.  Stomach/Bowel: Stomach is within normal limits. Appendix appears normal. No evidence of bowel wall thickening, distention, or inflammatory changes. Vascular/Lymphatic: Aortic atherosclerosis. No enlarged abdominal or pelvic lymph nodes. Reproductive: Uterus and bilateral adnexa are unremarkable. Other: No free fluid. No abdominopelvic fluid collection. No pneumoperitoneum. No abdominal wall hernia. Musculoskeletal: Chronic L1 and L3 compression fractures. Prior L3 cement augmentation. Unchanged grade 1-2 anterolisthesis at L5-S1. No new or acute bony abnormality. IMPRESSION: 1. Numerous punctate bilateral renal calculi. Severe left and moderate right hydronephrosis, similar to the prior study. Consider chronic proximal ureteral obstructions, possibly secondary  to retroperitoneal fibrosis. 2. Patchy airspace opacity within the left lower lobe and lingula including a 2.9 cm rounded pleural based opacity with configuration favoring rounded atelectasis. Mild scattered ground-glass opacity in the right lower lobe. Findings may represent a combination of atelectasis and infection. Follow-up CT of the chest in 3 months is recommended to assess for resolution. 3. Aortic atherosclerosis (ICD10-I70.0). Electronically Signed   By: Duanne Guess D.O.   On: 01/26/2023 17:18   DG Chest Portable 1 View  Result Date: 01/26/2023 CLINICAL DATA:  Left flank pain and vomiting associated with productive cough EXAM: PORTABLE CHEST 1 VIEW COMPARISON:  Chest radiograph dated 09/20/2013 FINDINGS: Normal lung volumes. Left basilar hazy and linear opacities. Nodular opacities projecting over the lateral bilateral apices, left-greater-than-right. Questionable blunting of the left costophrenic angle. No pneumothorax. Similar mildly enlarged cardiomediastinal silhouette. No acute osseous abnormality. IMPRESSION: 1. Left basilar hazy and linear opacities, which may reflect atelectasis, aspiration, or pneumonia 2. Nodular opacities  projecting over the lateral bilateral apices, left-greater-than-right, which may represent pulmonary nodules. Recommend further evaluation with chest CT. 3. Questionable blunting of the left costophrenic angle, which may represent a small pleural effusion or pleural thickening. Electronically Signed   By: Agustin Cree M.D.   On: 01/26/2023 17:14    Micro Results   Recent Results (from the past 240 hour(s))  Resp panel by RT-PCR (RSV, Flu A&B, Covid) Anterior Nasal Swab     Status: None   Collection Time: 01/26/23  5:12 PM   Specimen: Anterior Nasal Swab  Result Value Ref Range Status   SARS Coronavirus 2 by RT PCR NEGATIVE NEGATIVE Final    Comment: (NOTE) SARS-CoV-2 target nucleic acids are NOT DETECTED.  The SARS-CoV-2 RNA is generally detectable in upper respiratory specimens during the acute phase of infection. The lowest concentration of SARS-CoV-2 viral copies this assay can detect is 138 copies/mL. A negative result does not preclude SARS-Cov-2 infection and should not be used as the sole basis for treatment or other patient management decisions. A negative result may occur with  improper specimen collection/handling, submission of specimen other than nasopharyngeal swab, presence of viral mutation(s) within the areas targeted by this assay, and inadequate number of viral copies(<138 copies/mL). A negative result must be combined with clinical observations, patient history, and epidemiological information. The expected result is Negative.  Fact Sheet for Patients:  BloggerCourse.com  Fact Sheet for Healthcare Providers:  SeriousBroker.it  This test is no t yet approved or cleared by the [place] FDA and  has been authorized for detection and/or diagnosis of SARS-CoV-2 by FDA under an Emergency Use Authorization (EUA). This EUA will remain  in effect (meaning this test can be used) for the duration of the COVID-19  declaration under Section 564(b)(1) of the Act, 21 U.S.C.section 360bbb-3(b)(1), unless the authorization is terminated  or revoked sooner.       Influenza A by PCR NEGATIVE NEGATIVE Final   Influenza B by PCR NEGATIVE NEGATIVE Final    Comment: (NOTE) The Xpert Xpress SARS-CoV-2/FLU/RSV plus assay is intended as an aid in the diagnosis of influenza from Nasopharyngeal swab specimens and should not be used as a sole basis for treatment. Nasal washings and aspirates are unacceptable for Xpert Xpress SARS-CoV-2/FLU/RSV testing.  Fact Sheet for Patients: BloggerCourse.com  Fact Sheet for Healthcare Providers: SeriousBroker.it  This test is not yet approved or cleared by the [place] FDA and has been authorized for detection and/or diagnosis of SARS-CoV-2 by FDA under an Emergency  Use Authorization (EUA). This EUA will remain in effect (meaning this test can be used) for the duration of the COVID-19 declaration under Section 564(b)(1) of the Act, 21 U.S.C. section 360bbb-3(b)(1), unless the authorization is terminated or revoked.     Resp Syncytial Virus by PCR NEGATIVE NEGATIVE Final    Comment: (NOTE) Fact Sheet for Patients: BloggerCourse.com  Fact Sheet for Healthcare Providers: SeriousBroker.it  This test is not yet approved or cleared by the [place] FDA and has been authorized for detection and/or diagnosis of SARS-CoV-2 by FDA under an Emergency Use Authorization (EUA). This EUA will remain in effect (meaning this test can be used) for the duration of the COVID-19 declaration under Section 564(b)(1) of the Act, 21 U.S.C. section 360bbb-3(b)(1), unless the authorization is terminated or revoked.  Performed at [facility], [address], [place], [place] [address]   Blood culture (routine x 2)     Status: None (Preliminary result)   Collection Time: 01/26/23   6:54 PM   Specimen: Left Antecubital; Blood  Result Value Ref Range Status   Specimen Description LEFT ANTECUBITAL  Final   Special Requests   Final    BOTTLES DRAWN AEROBIC AND ANAEROBIC Blood Culture adequate volume   Culture   Final    NO GROWTH 2 DAYS Performed at [facility], [address], [place], [place] [address]    Report Status PENDING  Incomplete  Blood culture (routine x 2)     Status: None (Preliminary result)   Collection Time: 01/26/23  7:02 PM   Specimen: Left Antecubital; Blood  Result Value Ref Range Status   Specimen Description LEFT ANTECUBITAL  Final   Special Requests   Final    BOTTLES DRAWN AEROBIC AND ANAEROBIC Blood Culture adequate volume   Culture   Final    NO GROWTH 2 DAYS Performed at [facility], [address] [place], [place] [address]    Report Status PENDING  Incomplete  Urine Culture     Status: None   Collection Time: 01/27/23 12:25 AM   Specimen: Urine, Clean Catch  Result Value Ref Range Status   Specimen Description   Final    URINE, CLEAN CATCH Performed at [facility], [address] [place], [place] [address]    Special Requests   Final    NONE Performed at [facility], [address], [place], [place] [address]    Culture   Final    NO GROWTH Performed at [facility], [address] [place], [place] [address]    Report Status 01/28/2023 FINAL  Final  MRSA Next Gen by PCR, Nasal     Status: None   Collection Time: 01/27/23 10:50 PM   Specimen: Nasal Mucosa; Nasal Swab  Result Value Ref Range Status   MRSA by PCR Next Gen NOT DETECTED NOT DETECTED Final    Comment: (NOTE) The GeneXpert MRSA Assay (FDA approved for NASAL specimens only), is one component of a comprehensive MRSA colonization surveillance program. It is not intended to diagnose MRSA infection nor to guide or monitor treatment for MRSA infections. Test performance is not FDA approved in patients less than 61 years old. Performed at [facility], [address], [place], [place] [address]     Today   Subjective    Autumn Duran today has no new complaints  No fever  Or chills   No Nausea, Vomiting or Diarrhea          Patient has been  seen and examined prior to discharge   Objective   Blood pressure 123/66, pulse 76, temperature 97.6 F (36.4 C), temperature source Oral, resp. rate 18, height 5\' 1"  (1.549 m), weight 69.2 kg, SpO2 92%.   Intake/Output Summary (Last 24 hours) at 01/28/2023 1700 Last data filed at 01/28/2023 1425 Gross per 24 hour  Intake 986.4 ml  Output --  Net 986.4 ml   Exam Gen:- Awake Alert, no acute distress  HEENT:- .AT, No sclera icterus Neck-Supple Neck,No JVD,.  Lungs-  CTAB , good air movement bilaterally CV- S1, S2 normal, regular Abd-  +ve B.Sounds, Abd Soft, No tenderness, no CVA area tenderness    Extremity/Skin:- No  edema,   good pulses Psych-affect is appropriate, oriented x3 Neuro-no new focal deficits, no tremors    Data Review   CBC w Diff:  Lab Results  Component Value Date   WBC 10.4 01/27/2023   HGB 10.3 (L) 01/27/2023   HGB 9.9 (L) 03/26/2014   HCT 31.9 (L) 01/27/2023   HCT 32.5 (L) 03/26/2014   PLT 240 01/27/2023   PLT 318 03/26/2014   LYMPHOPCT 25 06/12/2019   LYMPHOPCT 26.0 03/26/2014   MONOPCT 3.5 11/21/2022   MONOPCT 9.5 03/26/2014   MONOPCT 12 03/26/2014   EOSPCT 0.8 11/21/2022   EOSPCT 3.1 03/26/2014   BASOPCT 0.6 11/21/2022   BASOPCT 1.1 03/26/2014   CMP:  Lab Results  Component Value Date   NA 141 01/28/2023   K 3.9 01/28/2023   CL 104 01/28/2023   CO2 28 01/28/2023   BUN 18 01/28/2023   CREATININE 1.13 (H) 01/28/2023   CREATININE 1.20 (H) 11/21/2022   PROT 5.9 (L) 01/27/2023   ALBUMIN 2.6 (L) 01/27/2023   BILITOT 0.8 01/27/2023   ALKPHOS 54 01/27/2023   AST 9 (L) 01/27/2023   ALT 7 01/27/2023   Total Discharge time is about 33 minutes  Shon Hale M.D on 01/28/2023 at 5:00 PM  Go to [email or web address] -  for contact  info  [facility] - Office  [phone]

## 2023-01-28 NOTE — Discharge Instructions (Signed)
1) please take antibiotics as prescribed=--you already received a dose of antibiotic for today, start oral antibiotics on Monday, 01/29/2023 2)follow up with your primary care physician Autumn Duran B, MD in about a week or so for recheck and reevaluation

## 2023-01-28 NOTE — Progress Notes (Signed)
Patient refused her VTE heparin last night and this morning. Adefeso, DO made aware.

## 2023-01-28 NOTE — Progress Notes (Signed)
Patient is sweating, c/o headache and back pain. Patient stated she feels like she is having withdrawals. Patient stated she takes daily oxycodone at home and has not taken it since 11/29. Patient bp soft. Adefeso, DO notified. Received order for 500 NS bolus.

## 2023-01-28 NOTE — Progress Notes (Signed)
Nsg Discharge Note  Admit Date:  01/26/2023 Discharge date: 01/28/2023   Autumn Duran to be D/C'd Home per MD order.  AVS completed.  Patient/caregiver able to verbalize understanding.  Discharge Medication: Allergies as of 01/28/2023       Reactions   Chlorhexidine Gluconate Itching   Pt c/o itching after abd wipes.    Morphine And Codeine Nausea And Vomiting   Headache   Tetracycline Nausea Only        Medication List     STOP taking these medications    ibuprofen 200 MG tablet Commonly known as: ADVIL       TAKE these medications    acetaminophen 325 MG tablet Commonly known as: TYLENOL Take 2 tablets (650 mg total) by mouth every 6 (six) hours as needed for mild pain (pain score 1-3) (or Fever >/= 101).   azaTHIOprine 50 MG tablet Commonly known as: IMURAN TAKE 2 TABLETS BY MOUTH DAILY   azithromycin 500 MG tablet Commonly known as: ZITHROMAX Take 1 tablet (500 mg total) by mouth daily for 3 days.   cephALEXin 500 MG capsule Commonly known as: Keflex Take 1 capsule (500 mg total) by mouth 3 (three) times daily for 5 days.   HYDROcodone-acetaminophen 5-325 MG tablet Commonly known as: NORCO/VICODIN Take 1 tablet by mouth every 6 (six) hours as needed (severe pain.).   omeprazole 20 MG capsule Commonly known as: PRILOSEC Take 20 mg by mouth daily before breakfast.   oxyCODONE 20 mg 12 hr tablet Commonly known as: OXYCONTIN Take 20 mg by mouth in the morning.   predniSONE 5 MG tablet Commonly known as: DELTASONE TAKE 1 TABLET BY MOUTH DAILY WITH BREAKFAST What changed: how much to take   rosuvastatin 5 MG tablet Commonly known as: CRESTOR Take 5 mg by mouth once a week.   valsartan-hydrochlorothiazide 80-12.5 MG tablet Commonly known as: DIOVAN-HCT Take 1 tablet by mouth daily.        Discharge Assessment: Vitals:   01/28/23 0836 01/28/23 1426  BP: (!) 127/56 123/66  Pulse: 62 76  Resp: 18 18  Temp:  97.6 F (36.4 C)  SpO2:  92%    Skin clean, dry and intact without evidence of skin break down, no evidence of skin tears noted. IV catheter discontinued intact. Site without signs and symptoms of complications - no redness or edema noted at insertion site, patient denies c/o pain - only slight tenderness at site.  Dressing with slight pressure applied.  D/c Instructions-Education: Discharge instructions given to patient/family with verbalized understanding. D/c education completed with patient/family including follow up instructions, medication list, d/c activities limitations if indicated, with other d/c instructions as indicated by MD - patient able to verbalize understanding, all questions fully answered. Patient instructed to return to ED, call 911, or call MD for any changes in condition.  Patient escorted via WC, and D/C home via private auto.  Laurena Spies, RN 01/28/2023 5:03 PM

## 2023-01-30 LAB — LEGIONELLA PNEUMOPHILA SEROGP 1 UR AG: L. pneumophila Serogp 1 Ur Ag: NEGATIVE

## 2023-01-31 LAB — CULTURE, BLOOD (ROUTINE X 2)
Special Requests: ADEQUATE
Special Requests: ADEQUATE

## 2023-02-05 ENCOUNTER — Telehealth: Payer: Self-pay | Admitting: Urology

## 2023-02-05 NOTE — Telephone Encounter (Signed)
Was in hospital, she is having pain in lower right side. Wants to know if Dr can look at CT scan from hospital and see if he sees anything.

## 2023-02-05 NOTE — Telephone Encounter (Signed)
Pt requesting you look at CT scan results

## 2023-02-06 NOTE — Telephone Encounter (Signed)
Pt was called to let her know her CT results per Dr.McKenzie "no ureteral stone and stable hydronephrosis"

## 2023-02-09 DIAGNOSIS — H25013 Cortical age-related cataract, bilateral: Secondary | ICD-10-CM | POA: Diagnosis not present

## 2023-02-09 DIAGNOSIS — H2513 Age-related nuclear cataract, bilateral: Secondary | ICD-10-CM | POA: Diagnosis not present

## 2023-02-09 DIAGNOSIS — H5213 Myopia, bilateral: Secondary | ICD-10-CM | POA: Diagnosis not present

## 2023-02-09 DIAGNOSIS — H52203 Unspecified astigmatism, bilateral: Secondary | ICD-10-CM | POA: Diagnosis not present

## 2023-02-09 DIAGNOSIS — H524 Presbyopia: Secondary | ICD-10-CM | POA: Diagnosis not present

## 2023-02-09 DIAGNOSIS — H43813 Vitreous degeneration, bilateral: Secondary | ICD-10-CM | POA: Diagnosis not present

## 2023-02-14 ENCOUNTER — Ambulatory Visit: Payer: Medicare HMO | Admitting: Urology

## 2023-02-14 VITALS — BP 134/69 | HR 64

## 2023-02-14 DIAGNOSIS — N200 Calculus of kidney: Secondary | ICD-10-CM

## 2023-02-14 DIAGNOSIS — K682 Retroperitoneal fibrosis: Secondary | ICD-10-CM

## 2023-02-14 NOTE — Progress Notes (Signed)
02/14/2023 3:04 PM   Autumn Duran 03-20-1950 [id number]  Referring provider: Ignatius Specking, MD [address] [place],  [place] [address]  Followup nephrolithiasis and hydronephrosis   HPI: Autumn Duran is a 72yo here for followup for nephrolithiasis and hydronephrosis. No stone events since last visit. She denies nay flank pain. She drinks over 64oz of water daily. Ct stone study 11/29 shows stable retroperitoneal fibrosis and stable hydronephrosis. She has stable bilateral renal calculi   PMH: Past Medical History:  Diagnosis Date   Bronchitis, chronic (HCC)    Cancer (HCC)    Chronic back pain    Chronic kidney disease    kidney stone s/p stent placement ( removed)   Complication of anesthesia    Compression fracture of L3 lumbar vertebra    Diverticulosis    Elbow fracture 10/2022   GERD (gastroesophageal reflux disease)    Hemorrhoids    Hiatal hernia    History of kidney stones    Hx of adenomatous colonic polyps    Hyperlipidemia    Hypertension    Lumbar radicular pain    Osteoporosis 12/10/2017   PONV (postoperative nausea and vomiting)    Sore throat    T7 vertebral fracture [facility])     Surgical History: Past Surgical History:  Procedure Laterality Date   BACK SURGERY     BONE MARROW ASPIRATION Left 02/2014   BONE MARROW BIOPSY Left 02/2014   CHOLECYSTECTOMY     COLONOSCOPY  12/18/2011   Procedure: COLONOSCOPY;  Surgeon: Hart Carwin, MD;  Location: [facility];  Service: Endoscopy;  Laterality: N/A;   COLONOSCOPY WITH PROPOFOL N/A 08/03/2017   Procedure: COLONOSCOPY WITH PROPOFOL;  Surgeon: Malissa Hippo, MD;  Location: [facility];  Service: Endoscopy;  Laterality: N/A;   CYSTOSCOPY W/ URETERAL STENT PLACEMENT Bilateral 08/18/2021   Procedure: CYSTOSCOPY WITH  BILATERAL RETROGRADE PYELOGRAM BILATERAL Otilio Miu STENT PLACEMENT;  Surgeon: Malen Gauze, MD;  Location: [facility];  Service: Urology;  Laterality: Bilateral;   CYSTOSCOPY WITH RETROGRADE  PYELOGRAM, URETEROSCOPY AND STENT PLACEMENT Bilateral 09/01/2021   Procedure: CYSTOSCOPY WITH  bilateral RETROGRADE PYELOGRAM, URETEROSCOPY AND  bilateral STENT exchange;  Surgeon: Malen Gauze, MD;  Location: [facility];  Service: Urology;  Laterality: Bilateral;   CYSTOSCOPY/RETROGRADE/URETEROSCOPY/STONE EXTRACTION WITH BASKET Bilateral 04/18/2018   Procedure: CYSTOSCOPY/BILATERAL RETROGRADE/URETEROSCOPY/STONE EXTRACTION WITH BASKET WITH RIGHT STENT PLACEMENT;  Surgeon: Malen Gauze, MD;  Location: [facility];  Service: Urology;  Laterality: Bilateral;  1 HR   ESOPHAGEAL DILATION N/A 08/03/2017   Procedure: ESOPHAGEAL DILATION;  Surgeon: Malissa Hippo, MD;  [facility];  Service: Endoscopy;  Laterality: N/A;   ESOPHAGOGASTRODUODENOSCOPY  12/18/2011   Procedure: ESOPHAGOGASTRODUODENOSCOPY (EGD);  Surgeon: Hart Carwin, MD;  Location: [facility];  Service: Endoscopy;  Laterality: N/A;   ESOPHAGOGASTRODUODENOSCOPY (EGD) WITH PROPOFOL N/A 08/03/2017   Procedure: ESOPHAGOGASTRODUODENOSCOPY (EGD) WITH PROPOFOL;  Surgeon: Malissa Hippo, MD;  [facility];  Service: Endoscopy;  Laterality: N/A;  pt knows to arrive at 6:15   HERNIA REPAIR     HOLMIUM LASER APPLICATION Right 04/18/2018   Procedure: HOLMIUM LASER APPLICATION;  Surgeon: Malen Gauze, MD;  Location: [facility];  Service: Urology;  Laterality: Right;   HOLMIUM LASER APPLICATION Bilateral 09/01/2021   Procedure: HOLMIUM LASER APPLICATION;  Surgeon: Malen Gauze, MD;  Location: [facility];  Service: Urology;  Laterality: Bilateral;   LUMBAR LAMINECTOMY FOR EPIDURAL ABSCESS Left 09/22/2013   Procedure: LUMBAR LAMINECTOMY FOR EPIDURAL  ABSCESS LEFT LUMBAR FIVE-SACRAL ONE;  Surgeon: Temple Pacini, MD;  Location: [facility];  Service: Neurosurgery;  Laterality: Left;   RADIOLOGY WITH ANESTHESIA N/A 08/13/2014   Procedure: RADIOLOGY WITH ANESTHESIA ;  Surgeon: Medication Radiologist, MD;  Location: [facility];   Service: Radiology;  Laterality: N/A;   URETERAL STENT PLACEMENT     VERTEBROPLASTY N/A 07/14/2013   Procedure: VERTEBROPLASTY WITH LUMBAR THREE BIOPSY;  Surgeon: Temple Pacini, MD;  Location: [facility];  Service: Neurosurgery;  Laterality: N/A;  VERTEBROPLASTY WITH LUMBAR THREE BIOPSY    Home Medications:  Allergies as of 02/14/2023       Reactions   Chlorhexidine Gluconate Itching   Pt c/o itching after abd wipes.    Morphine And Codeine Nausea And Vomiting   Headache   Tetracycline Nausea Only        Medication List        Accurate as of February 14, 2023  3:04 PM. If you have any questions, ask your nurse or doctor.          acetaminophen 325 MG tablet Commonly known as: TYLENOL Take 2 tablets (650 mg total) by mouth every 6 (six) hours as needed for mild pain (pain score 1-3) (or Fever >/= 101).   azaTHIOprine 50 MG tablet Commonly known as: IMURAN TAKE 2 TABLETS BY MOUTH DAILY   HYDROcodone-acetaminophen 5-325 MG tablet Commonly known as: NORCO/VICODIN Take 1 tablet by mouth every 6 (six) hours as needed (severe pain.).   omeprazole 20 MG capsule Commonly known as: PRILOSEC Take 20 mg by mouth daily before breakfast.   oxyCODONE 20 mg 12 hr tablet Commonly known as: OXYCONTIN Take 20 mg by mouth in the morning.   predniSONE 5 MG tablet Commonly known as: DELTASONE TAKE 1 TABLET BY MOUTH DAILY WITH BREAKFAST What changed: how much to take   rosuvastatin 5 MG tablet Commonly known as: CRESTOR Take 5 mg by mouth once a week.   valsartan-hydrochlorothiazide 80-12.5 MG tablet Commonly known as: DIOVAN-HCT Take 1 tablet by mouth daily.        Allergies:  Allergies  Allergen Reactions   Chlorhexidine Gluconate Itching    Pt c/o itching after abd wipes.    Morphine And Codeine Nausea And Vomiting    Headache   Tetracycline Nausea Only    Family History: Family History  Problem Relation Age of Onset   Breast cancer Sister 75   Breast  cancer Other 14       aunt   Hypertension Mother    COPD Mother    COPD Father    Hypertension Sister    Aortic aneurysm Sister    Hypertension Son     Social History:  reports that she has never smoked. She has been exposed to tobacco smoke. She has never used smokeless tobacco. She reports that she does not currently use alcohol. She reports that she does not use drugs.  ROS: All other review of systems were reviewed and are negative except what is noted above in HPI  Physical Exam: BP 134/69   Pulse 64   Constitutional:  Alert and oriented, No acute distress. HEENT: [place] AT, moist mucus membranes.  Trachea midline, no masses. Cardiovascular: No clubbing, cyanosis, or edema. Respiratory: Normal respiratory effort, no increased work of breathing. GI: Abdomen is soft, nontender, nondistended, no abdominal masses GU: No CVA tenderness.  Lymph: No cervical or inguinal lymphadenopathy. Skin: No rashes, bruises or suspicious lesions. Neurologic: Grossly intact, no  focal deficits, moving all 4 extremities. Psychiatric: Normal mood and affect.  Laboratory Data: Lab Results  Component Value Date   WBC 10.4 01/27/2023   HGB 10.3 (L) 01/27/2023   HCT 31.9 (L) 01/27/2023   MCV 93.5 01/27/2023   PLT 240 01/27/2023    Lab Results  Component Value Date   CREATININE 1.13 (H) 01/28/2023    No results found for: "PSA"  No results found for: "TESTOSTERONE"  No results found for: "HGBA1C"  Urinalysis    Component Value Date/Time   COLORURINE AMBER (A) 01/27/2023 0025   APPEARANCEUR HAZY (A) 01/27/2023 0025   APPEARANCEUR Clear 07/10/2022 1444   LABSPEC 1.016 01/27/2023 0025   PHURINE 5.0 01/27/2023 0025   GLUCOSEU NEGATIVE 01/27/2023 0025   HGBUR MODERATE (A) 01/27/2023 0025   BILIRUBINUR NEGATIVE 01/27/2023 0025   BILIRUBINUR Negative 07/10/2022 1444   KETONESUR NEGATIVE 01/27/2023 0025   PROTEINUR 30 (A) 01/27/2023 0025   UROBILINOGEN 0.2 08/13/2014 0850   NITRITE  NEGATIVE 01/27/2023 0025   LEUKOCYTESUR TRACE (A) 01/27/2023 0025    Lab Results  Component Value Date   LABMICR See below: 07/10/2022   WBCUA 11-30 (A) 07/10/2022   LABEPIT >10 (A) 07/10/2022   MUCUS Present 09/08/2021   BACTERIA MANY (A) 01/27/2023    Pertinent Imaging: Ct 01/26/2023: Images reviewed and discussed with the patient  No results found for this or any previous visit.  Results for orders placed during the hospital encounter of 12/18/13  US Venous Img Lower Bilateral  Narrative CLINICAL DATA:  Leg swelling  EXAM: BILATERAL LOWER EXTREMITY VENOUS DOPPLER ULTRASOUND  TECHNIQUE: Gray-scale sonography with graded compression, as well as color Doppler and duplex ultrasound were performed to evaluate the lower extremity deep venous systems from the level of the common femoral vein and including the common femoral, femoral, profunda femoral, popliteal and calf veins including the posterior tibial, peroneal and gastrocnemius veins when visible. The superficial great saphenous vein was also interrogated. Spectral Doppler was utilized to evaluate flow at rest and with distal augmentation maneuvers in the common femoral, femoral and popliteal veins.  COMPARISON:  None.  FINDINGS: RIGHT LOWER EXTREMITY  Common Femoral Vein: No evidence of thrombus. Normal compressibility, respiratory phasicity and response to augmentation.  Saphenofemoral Junction: No evidence of thrombus. Normal compressibility and flow on color Doppler imaging.  Profunda Femoral Vein: No evidence of thrombus. Normal compressibility and flow on color Doppler imaging.  Femoral Vein: No evidence of thrombus. Normal compressibility, respiratory phasicity and response to augmentation.  Popliteal Vein: No evidence of thrombus. Normal compressibility, respiratory phasicity and response to augmentation.  Calf Veins: No evidence of thrombus. Normal compressibility and flow on color Doppler  imaging.  Superficial Great Saphenous Vein: No evidence of thrombus. Normal compressibility and flow on color Doppler imaging.  Venous Reflux:  None.  Other Findings:  None.  LEFT LOWER EXTREMITY  Common Femoral Vein: No evidence of thrombus. Normal compressibility, respiratory phasicity and response to augmentation.  Saphenofemoral Junction: No evidence of thrombus. Normal compressibility and flow on color Doppler imaging.  Profunda Femoral Vein: No evidence of thrombus. Normal compressibility and flow on color Doppler imaging.  Femoral Vein: No evidence of thrombus. Normal compressibility, respiratory phasicity and response to augmentation.  Popliteal Vein: No evidence of thrombus. Normal compressibility, respiratory phasicity and response to augmentation.  Calf Veins: Not well visualized.  Superficial Great Saphenous Vein: No evidence of thrombus. Normal compressibility and flow on color Doppler imaging.  Venous Reflux:  None.  Other Findings:  None.  IMPRESSION: No evidence of deep venous thrombosis.   Electronically Signed By: Elige Ko On: 12/18/2013 13:37  No results found for this or any previous visit.  No results found for this or any previous visit.  Results for orders placed during the hospital encounter of 07/04/22  Ultrasound renal complete  Narrative CLINICAL DATA:  Nephrolithiasis.  Known hydronephrosis.  EXAM: RENAL / URINARY TRACT ULTRASOUND COMPLETE  COMPARISON:  Renal ultrasound of amber 09/15/2021  FINDINGS: Right Kidney:  Renal measurements: 9.8 x 5.0 x 5.5 cm = volume: 141 mL. Moderate hydronephrosis today versus mild hydronephrosis previously.  Left Kidney:  Renal measurements: 10.7 x 4.5 x 4.4 cm = volume: 112 mL. Severe hydronephrosis today versus moderate hydronephrosis previously.  Bladder:  The bladder cannot be evaluated due to lack of distention.  Other:  None.  IMPRESSION: 1. Moderate right and severe  left hydronephrosis today versus mild right and moderate left hydronephrosis previously. 2. The bladder cannot be evaluated due to lack of distention.   Electronically Signed By: Gerome Sam III M.D. On: 07/04/2022 17:00  No results found for this or any previous visit.  No results found for this or any previous visit.  Results for orders placed during the hospital encounter of 01/26/23  CT Renal Stone Study  Narrative CLINICAL DATA:  Left flank pain  EXAM: CT ABDOMEN AND PELVIS WITHOUT CONTRAST  TECHNIQUE: Multidetector CT imaging of the abdomen and pelvis was performed following the standard protocol without IV contrast.  RADIATION DOSE REDUCTION: This exam was performed according to the departmental dose-optimization program which includes automated exposure control, adjustment of the mA and/or kV according to patient size and/or use of iterative reconstruction technique.  COMPARISON:  07/11/2022  FINDINGS: Lower chest: Patchy airspace opacity within the left lower lobe and lingula including a 2.9 cm rounded pleural based opacity with configuration favoring rounded atelectasis. Mild scattered ground-glass opacity in the right lower lobe. Heart size is normal.  Hepatobiliary: No focal liver abnormality is seen. Status post cholecystectomy. No biliary dilatation.  Pancreas: Pancreatic atrophy. No ductal dilatation or fat stranding.  Spleen: Normal in size without focal abnormality.  Adrenals/Urinary Tract: Unremarkable adrenal glands. Numerous punctate 1-2 mm stones within both kidneys. Severe left and moderate right hydronephrosis. No ureteral dilation. No ureteral calculi. Urinary bladder is decompressed.  Stomach/Bowel: Stomach is within normal limits. Appendix appears normal. No evidence of bowel wall thickening, distention, or inflammatory changes.  Vascular/Lymphatic: Aortic atherosclerosis. No enlarged abdominal or pelvic lymph  nodes.  Reproductive: Uterus and bilateral adnexa are unremarkable.  Other: No free fluid. No abdominopelvic fluid collection. No pneumoperitoneum. No abdominal wall hernia.  Musculoskeletal: Chronic L1 and L3 compression fractures. Prior L3 cement augmentation. Unchanged grade 1-2 anterolisthesis at L5-S1. No new or acute bony abnormality.  IMPRESSION: 1. Numerous punctate bilateral renal calculi. Severe left and moderate right hydronephrosis, similar to the prior study. Consider chronic proximal ureteral obstructions, possibly secondary to retroperitoneal fibrosis. 2. Patchy airspace opacity within the left lower lobe and lingula including a 2.9 cm rounded pleural based opacity with configuration favoring rounded atelectasis. Mild scattered ground-glass opacity in the right lower lobe. Findings may represent a combination of atelectasis and infection. Follow-up CT of the chest in 3 months is recommended to assess for resolution. 3. Aortic atherosclerosis (ICD10-I70.0).   Electronically Signed By: Duanne Guess D.O. On: 01/26/2023 17:18   Assessment & Plan:    1. Nephrolithiasis (Primary) -followup 6 months with renal US - Urinalysis, Routine w  reflex microscopic  2. Retroperitoneal fibrosis -followup 6 months with renal US   No follow-ups on file.  Wilkie Aye, MD  [facility] [place]

## 2023-02-15 LAB — URINALYSIS, ROUTINE W REFLEX MICROSCOPIC
Bilirubin, UA: NEGATIVE
Glucose, UA: NEGATIVE
Ketones, UA: NEGATIVE
Nitrite, UA: NEGATIVE
Protein,UA: NEGATIVE
Specific Gravity, UA: 1.02 (ref 1.005–1.030)
Urobilinogen, Ur: 1 mg/dL (ref 0.2–1.0)
pH, UA: 6.5 (ref 5.0–7.5)

## 2023-02-15 LAB — MICROSCOPIC EXAMINATION

## 2023-02-22 NOTE — Progress Notes (Signed)
Office Visit Note  Patient: Autumn Duran             Date of Birth: January 18, 1951           MRN: [id number]             PCP: Ignatius Specking, MD Referring: Ignatius Specking, MD Visit Date: 03/06/2023   Subjective:  Follow-up  Discussed the use of AI scribe software for clinical note transcription with the patient, who gave verbal consent to proceed.  History of Present Illness   Autumn Duran is a 72 y.o. female here for follow up for retroperitoneal fibrosis complicated by low back pain and urinary obstruction currently on azathioprine 100 mg daily prednisone 5 mg daily.  She was recently hospitalized for pneumonia. She describes the experience as "horrible," noting that it took two weeks to regain her strength. The patient reports no current respiratory symptoms, such as coughing or expectoration, but does mention a general body ache during the pneumonia episode.  The patient also has a history of chronic kidney disease. She reports that her kidney function numbers have been fluctuating, but her most recent GFR was in the fifties, which is close to her baseline. She is currently on Imuran for her retroperitoneal fibrosis and has not reported any issues with this medication.  The patient denies any current pain in her back or flank, but does mention a slight soreness in the middle of her back. She also reports feeling generally achy on some days, but attributes this to her age. She has not had any other infections or needed antibiotics since her hospitalization for pneumonia.    Previous HPI 11/21/2022 Autumn Duran is a 72 y.o. female here for follow up for retroperitoneal fibrosis complicated by low back pain and urinary obstruction currently on azathioprine 100 mg daily prednisone 2.5 mg daily.  We increased medication dose after the last visit due to markedly elevated sedimentation rate although was not having additional focal symptoms.  She had recent event falling with a right elbow  fracture this occurred after trying to turn quickly while holding items in both hands for a school function.  Also has a some associated right rib or otherwise chest wall pain.  He is having poor sleep and associated fatigue describes repeat waking throughout the night about once every hour.  Not associated with needing to urinate or pain.  She has not discussed this with her PCP office or had any sleep medicine evaluation.   Previous HPI 08/15/2022 Autumn Duran is a 72 y.o. female here for follow up for retroperitoneal fibrosis complicated by low back pain and urinary obstruction currently on azathioprine 50 mg daily prednisone 2.5 mg daily.  Since last visit she had repeat ultrasound suggestive for slight worsening of hydronephrosis.  However CT scan looked stable.  She feels significantly worse on the lower dose than she did on 5 mg of prednisone.  She took a slightly increased dose when dealing with some upper respiratory symptoms recently which was more helpful for back pain. She is not noticing any abdominal or flank pain symptoms.   Previous HPI 05/16/22 Autumn Duran is a 72 y.o. female here for follow up for retroperitoneal fibrosis on azathioprine 50 mg daily and prednisone 2.5 mg daily.  Since her last visit she did not notice any increase in abdominal or flank pain or urinary symptoms.  Has noticed some increase in joint pain and swelling at the right fifth PIP  joint.  She takes ibuprofen very occasionally which helps the symptoms takes Tylenol sometimes it is not very helpful.  She did get sick with a sinus infection treated with a short course of Levaquin with good resolution.  Currently having congestion and drainage with nonproductive cough again related to seasonal allergies.  She is scheduled for repeat kidney ultrasound around the end of April with May follow-up in urology clinic.   Previous HPI 02/07/22 Autumn Duran is a 72 y.o. female here for follow up for retroperitoneal  fibrosis on AZA 100 mg daily and prednisone 2.5 mg daily. She is limited but generalized arthritis pain when tapering prednisone previously. She had some intermittent right lower quadrant pain but none on most days. Recent labs in urology clinic showed normal urinalysis and kidneys appeared with chronic hydronephrosis change but stable on ultrasound appearance. She had recent labs checked with her PCP office a few days ago.   Previous HPI 11/09/21 Autumn Duran is a 72 y.o. female here for follow up for retroperitoneal fibrosis on azathioprine 100 mg daily and prednisone 2.5 mg daily. Since our last visit she saw found to have severe hydronephrosis bilaterally and bilateral obstructing nephrolithiasis. Stent placement and stone removal and subsequent ultrasound showed very good improvement in swelling. Her flank pains also resolved after treatment of these stones.   Previous HPI 08/02/2021 Autumn Duran is a 72 y.o. female here for follow up for retroperitoneal fibrosis on azathioprine 100 mg daily. She is doing well overall she had one episode of right flank and lower abdominal pain lasting about 1 week in duration. She also started Diovan 80-12.5mg  for hypertension with improvement in headaches and blood pressure. She is not requiring and furosemide and no recent edema. She does report some increase in urinary frequency during the day times, with occasional leaking during coughing or sneezing. No pain with urination though. She does not recall exactly when this started.     Previous HPI 05/02/2021 Autumn Duran is a 72 y.o. female here for follow up for retroperitoneal fibrosis on azathioprine 100 mg daily. She discontinued prednisone after tapering she reported some increased pain in her hand and bones. This started about 3 weeks after she stopped the 2.5 mg prednisone. She also noticed a large increase in pedal edema with stopping prednisone that cleared up after 3 doses of lasix prescribed from PCP  office. She has some hand pain worst in the 5th fingers and low back pain. Her left leg and knee gave her some trouble also more stiffness and weakness than pain. She resumed taking prednisone 2.5 mg about 2 weeks ago with a large improvement in symptoms again.  She had one episode of right sided abdominal pain starting to the side and moving to the center in lower abdomen then resolved. She took one percocet due to severity of pain. She never saw any frank blood or stone during this.   Previous HPI 01/31/21 Autumn Duran is a 71 y.o. female here for follow up of retroperitoneal fibrosis with hydronephrosis on azathioprine 100 mg PO daily and prednisone 5 mg daily. She tapered prednisone down from 15 mg to 5 mg daily since last visit without noticing specific problems. She had URI symptoms with congestion and drainage treated with amoxicillin. Weight gain with central adiposity increase she reports 16 pounds total. She notices some sensitivity with monitor bumps playing with her grandson. She is scheduled for renal ultrasound on 12/8 for monitoring by Dr. Ronne Binning.  Previous HPI 10/11/20 Autumn Duran is a 72 y.o. female here for follow up for retroperitoneal fibrosis on prednisone 15 mg daily and azathioprine 100 mg p.o. daily.  Since our last visit she became ill with COVID-pneumonia symptoms took the paxlovid treatment for this and respiratory symptoms recovered completely.  She feels overall well recovered from the problem.  The left-sided chest wall and flank pain and left arm pain from her previous fall are also improved further.  She is noticing some swelling that comes and goes in her lower extremities more than before.  Lower abdominal skin rash is improved   Review of Systems  Constitutional:  Positive for fatigue.  HENT: Negative.  Negative for mouth sores and mouth dryness.   Eyes:  Positive for dryness.  Respiratory: Negative.  Negative for shortness of breath.   Cardiovascular:  Negative.  Negative for chest pain and palpitations.  Gastrointestinal:  Positive for constipation. Negative for blood in stool and diarrhea.  Endocrine: Negative.  Negative for increased urination.  Genitourinary:  Negative for involuntary urination.  Musculoskeletal:  Positive for muscle weakness. Negative for joint pain, gait problem, joint pain, joint swelling, myalgias, morning stiffness, muscle tenderness and myalgias.  Skin:  Positive for rash. Negative for color change, hair loss and sensitivity to sunlight.  Allergic/Immunologic: Negative.  Negative for susceptible to infections.  Neurological:  Positive for headaches. Negative for dizziness.  Hematological: Negative.  Negative for swollen glands.  Psychiatric/Behavioral:  Positive for sleep disturbance. Negative for depressed mood. The patient is not nervous/anxious.     PMFS History:  Patient Active Problem List   Diagnosis Date Noted   Right lower lobe pneumonia 01/26/2023   Acute pyelonephritis 01/26/2023   Otitis externa 01/26/2023   Acute kidney injury superimposed on CKD (HCC) 01/26/2023   Dehydration 01/26/2023   Hypoalbuminemia due to protein-calorie malnutrition (HCC) 01/26/2023   Mixed hyperlipidemia 01/26/2023   CAP (community acquired pneumonia) 01/26/2023   Closed fracture of right elbow 11/21/2022   Urinary frequency 08/02/2021   Osteoarthritis of both hands 05/02/2021   COVID-19 10/11/2020   Right-sided back pain 10/11/2020   Left-sided chest wall pain 08/09/2020   High risk medication use 08/09/2020   Varicose veins of leg with swelling, bilateral 07/05/2020   Primary hypercholesterolemia 05/11/2020   Compression fracture of L1 lumbar vertebra (HCC) 05/11/2020   Bilateral nephrolithiasis 05/11/2020   Bilateral hydronephrosis 04/14/2020   Retroperitoneal fibrosis 03/09/2020   Degenerative scoliosis 03/05/2020   Degenerative spondylolisthesis 03/05/2020   Opioid dependence (HCC) 09/18/2019   Elevated  blood-pressure reading, without diagnosis of hypertension 01/08/2019   Closed fracture of proximal end of right humerus 06/26/2018   Paraesophageal hernia 12/12/2017   Osteoporosis 12/10/2017   Dysphagia 06/26/2017   Gastroesophageal reflux disease 06/26/2017   Hx of colonic polyps 06/26/2017   Nausea with vomiting 10/12/2014   Acute low back pain 08/13/2014   Epidural abscess 08/13/2014   Hypertension    Chronic back pain    Essential hypertension    Other iron deficiency anemia 04/10/2014   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/2014   Right hip pain 12/23/2013   Epidural abscess, L2-L5 11/04/2013   Hypokalemia 08/20/2013   Lumbar compression fracture (HCC) 07/12/2013   Compression fracture 07/12/2013   Benign neoplasm of colon 12/18/2011   Esophageal reflux 12/18/2011   Other dysphagia 12/18/2011    Past Medical History:  Diagnosis Date   Bronchitis, chronic (HCC)    Cancer (HCC)    Chronic back pain  Chronic kidney disease    kidney stone s/p stent placement ( removed)   Complication of anesthesia    Compression fracture of L3 lumbar vertebra    Diverticulosis    Elbow fracture 10/2022   GERD (gastroesophageal reflux disease)    Hemorrhoids    Hiatal hernia    History of kidney stones    Hx of adenomatous colonic polyps    Hyperlipidemia    Hypertension    Lumbar radicular pain    Osteoporosis 12/10/2017   PONV (postoperative nausea and vomiting)    Sore throat    T7 vertebral fracture (HCC)     Family History  Problem Relation Age of Onset   Breast cancer Sister 76   Breast cancer Other 65       aunt   Hypertension Mother    COPD Mother    COPD Father    Hypertension Sister    Aortic aneurysm Sister    Hypertension Son    Past Surgical History:  Procedure Laterality Date   BACK SURGERY     BONE MARROW ASPIRATION Left 02/2014   BONE MARROW BIOPSY Left 02/2014   CHOLECYSTECTOMY     COLONOSCOPY  12/18/2011   Procedure: COLONOSCOPY;  Surgeon: Hart Carwin, MD;  Location: [facility];  Service: Endoscopy;  Laterality: N/A;   COLONOSCOPY WITH PROPOFOL N/A 08/03/2017   Procedure: COLONOSCOPY WITH PROPOFOL;  Surgeon: Malissa Hippo, MD;  Location: [facility];  Service: Endoscopy;  Laterality: N/A;   CYSTOSCOPY W/ URETERAL STENT PLACEMENT Bilateral 08/18/2021   Procedure: CYSTOSCOPY WITH  BILATERAL RETROGRADE PYELOGRAM BILATERAL Otilio Miu STENT PLACEMENT;  Surgeon: Malen Gauze, MD;  Location: [facility];  Service: Urology;  Laterality: Bilateral;   CYSTOSCOPY WITH RETROGRADE PYELOGRAM, URETEROSCOPY AND STENT PLACEMENT Bilateral 09/01/2021   Procedure: CYSTOSCOPY WITH  bilateral RETROGRADE PYELOGRAM, URETEROSCOPY AND  bilateral STENT exchange;  Surgeon: Malen Gauze, MD;  Location: [facility];  Service: Urology;  Laterality: Bilateral;   CYSTOSCOPY/RETROGRADE/URETEROSCOPY/STONE EXTRACTION WITH BASKET Bilateral 04/18/2018   Procedure: CYSTOSCOPY/BILATERAL RETROGRADE/URETEROSCOPY/STONE EXTRACTION WITH BASKET WITH RIGHT STENT PLACEMENT;  Surgeon: Malen Gauze, MD;  Location: [facility];  Service: Urology;  Laterality: Bilateral;  1 HR   ESOPHAGEAL DILATION N/A 08/03/2017   Procedure: ESOPHAGEAL DILATION;  Surgeon: Malissa Hippo, MD;  Location: [facility];  Service: Endoscopy;  Laterality: N/A;   ESOPHAGOGASTRODUODENOSCOPY  12/18/2011   Procedure: ESOPHAGOGASTRODUODENOSCOPY (EGD);  Surgeon: Hart Carwin, MD;  Location: [facility];  Service: Endoscopy;  Laterality: N/A;   ESOPHAGOGASTRODUODENOSCOPY (EGD) WITH PROPOFOL N/A 08/03/2017   Procedure: ESOPHAGOGASTRODUODENOSCOPY (EGD) WITH PROPOFOL;  Surgeon: Malissa Hippo, MD;  Location: [facility];  Service: Endoscopy;  Laterality: N/A;  pt knows to arrive at 6:15   HERNIA REPAIR     HOLMIUM LASER APPLICATION Right 04/18/2018   Procedure: HOLMIUM LASER APPLICATION;  Surgeon: Malen Gauze, MD;  Location: [facility];  Service: Urology;  Laterality: Right;   HOLMIUM LASER APPLICATION  Bilateral 09/01/2021   Procedure: HOLMIUM LASER APPLICATION;  Surgeon: Malen Gauze, MD;  Location: [facility];  Service: Urology;  Laterality: Bilateral;   LUMBAR LAMINECTOMY FOR EPIDURAL ABSCESS Left 09/22/2013   Procedure: LUMBAR LAMINECTOMY FOR EPIDURAL ABSCESS LEFT LUMBAR FIVE-SACRAL ONE;  Surgeon: Temple Pacini, MD;  Location: [facility];  Service: Neurosurgery;  Laterality: Left;   RADIOLOGY WITH ANESTHESIA N/A 08/13/2014   Procedure: RADIOLOGY WITH ANESTHESIA ;  Surgeon: Medication Radiologist, MD;  Location: [facility];  Service: Radiology;  Laterality: N/A;   URETERAL STENT PLACEMENT     VERTEBROPLASTY N/A 07/14/2013   Procedure: VERTEBROPLASTY WITH LUMBAR THREE BIOPSY;  Surgeon: Temple Pacini, MD;  Location: [facility];  Service: Neurosurgery;  Laterality: N/A;  VERTEBROPLASTY WITH LUMBAR THREE BIOPSY   Social History   Social History Narrative   Not on file   There is no immunization history for the selected administration types on file for this patient.   Objective: Vital Signs: BP 122/67   Pulse 72   Resp 16   Ht 5\' 1"  (1.549 m)   Wt 147 lb (66.7 kg)   BMI 27.78 kg/m    Physical Exam HENT:     Mouth/Throat:     Mouth: Mucous membranes are moist.     Pharynx: Oropharynx is clear.  Eyes:     Conjunctiva/sclera: Conjunctivae normal.  Cardiovascular:     Rate and Rhythm: Normal rate and regular rhythm.  Pulmonary:     Effort: Pulmonary effort is normal.     Breath sounds: Normal breath sounds.  Musculoskeletal:     Right lower leg: No edema.     Left lower leg: No edema.  Lymphadenopathy:     Cervical: No cervical adenopathy.  Skin:    General: Skin is warm and dry.     Findings: No rash.  Neurological:     Mental Status: She is alert.  Psychiatric:        Mood and Affect: Mood normal.      Musculoskeletal Exam:  Shoulders full ROM no tenderness or swelling Elbows full ROM no tenderness or swelling Fingers full ROM no tenderness or swelling Left  sided low back tenderness to pressure, lower than CVA and worse over paraspinal muscles and flank versus midline Knees full ROM no tenderness or swelling   Investigation: No additional findings.  Imaging: No results found.   Recent Labs: Lab Results  Component Value Date   WBC 9.5 03/06/2023   HGB 14.5 03/06/2023   PLT 311 03/06/2023   NA 140 03/06/2023   K 4.5 03/06/2023   CL 104 03/06/2023   CO2 29 03/06/2023   GLUCOSE 107 (H) 03/06/2023   BUN 21 03/06/2023   CREATININE 1.12 (H) 03/06/2023   BILITOT 0.5 03/06/2023   ALKPHOS 54 01/27/2023   AST 13 03/06/2023   ALT 6 03/06/2023   PROT 7.0 03/06/2023   ALBUMIN 2.6 (L) 01/27/2023   CALCIUM 11.1 (H) 03/06/2023   GFRAA 67 08/09/2020    Speciality Comments: No specialty comments available.  Procedures:  No procedures performed Allergies: Chlorhexidine gluconate, Morphine and codeine, and Tetracycline   Assessment / Plan:     Visit Diagnoses: Retroperitoneal fibrosis - Plan: Sedimentation rate Stable kidney function with recent GFR in the 50s, up from 30s. No need for stenting or intervention based on recent CT scan and urologist consultation. No current flank pain. -Checking sedimentation rate for disease activity monitoring -Continue Imuran 100 mg daily -Continue prednisone 2.5 mg daily  High risk medication use - azathioprine 100 mg daily. - Plan: CBC with Differential/Platelet, COMPLETE METABOLIC PANEL WITH GFR -Checking CBC and CMP for medication monitoring on continued long-term use of azathioprine and prednisone also for renal function monitoring with intermittent obstructive symptoms  Long term (current) use of systemic steroids - prednisone 5 mg daily  Chronic midline low back pain without sciatica - Continues on low-dose chronic pain medication for this.  Recent Pneumonia Recovered from recent hospitalization for pneumonia. No recurrent infections reported. -Monitor  for signs of recurrent infection given  immunosuppressive therapy.   Orders: Orders Placed This Encounter  Procedures   Sedimentation rate   CBC with Differential/Platelet   COMPLETE METABOLIC PANEL WITH GFR   No orders of the defined types were placed in this encounter.    Follow-Up Instructions: Return in about 3 months (around 06/04/2023) for RPF on AZA/GC f/u 3mos.   Fuller Plan, MD  Note - This record has been created using [employer].  Chart creation errors have been sought, but may not always  have been located. Such creation errors do not reflect on  the standard of medical care.

## 2023-02-27 ENCOUNTER — Encounter: Payer: Self-pay | Admitting: Urology

## 2023-03-05 ENCOUNTER — Other Ambulatory Visit: Payer: Self-pay | Admitting: Internal Medicine

## 2023-03-05 DIAGNOSIS — K682 Retroperitoneal fibrosis: Secondary | ICD-10-CM

## 2023-03-05 NOTE — Telephone Encounter (Signed)
 Last Fill: 11/24/2022  Labs: 02/14/2023 BMP Creatinine 1.13 GFR 52  01/27/2023 CBC RBC 3.41 Hemoglobin 10.3 HCT 31.9  Next Visit: 03/05/2023  Last Visit: 11/21/2022  DX: Retroperitoneal fibrosis   Current Dose per office note 11/21/2022: azathioprine  100 mg daily.   Okay to refill Imuran ?

## 2023-03-06 ENCOUNTER — Ambulatory Visit: Payer: Medicare HMO | Attending: Internal Medicine | Admitting: Internal Medicine

## 2023-03-06 ENCOUNTER — Encounter: Payer: Self-pay | Admitting: Internal Medicine

## 2023-03-06 VITALS — BP 122/67 | HR 72 | Resp 16 | Ht 61.0 in | Wt 147.0 lb

## 2023-03-06 DIAGNOSIS — M961 Postlaminectomy syndrome, not elsewhere classified: Secondary | ICD-10-CM | POA: Diagnosis not present

## 2023-03-06 DIAGNOSIS — K682 Retroperitoneal fibrosis: Secondary | ICD-10-CM

## 2023-03-06 DIAGNOSIS — S42401D Unspecified fracture of lower end of right humerus, subsequent encounter for fracture with routine healing: Secondary | ICD-10-CM

## 2023-03-06 DIAGNOSIS — E559 Vitamin D deficiency, unspecified: Secondary | ICD-10-CM

## 2023-03-06 DIAGNOSIS — Z79899 Other long term (current) drug therapy: Secondary | ICD-10-CM

## 2023-03-06 DIAGNOSIS — F1120 Opioid dependence, uncomplicated: Secondary | ICD-10-CM | POA: Diagnosis not present

## 2023-03-06 DIAGNOSIS — M545 Low back pain, unspecified: Secondary | ICD-10-CM

## 2023-03-06 DIAGNOSIS — G8929 Other chronic pain: Secondary | ICD-10-CM | POA: Diagnosis not present

## 2023-03-06 DIAGNOSIS — Z7952 Long term (current) use of systemic steroids: Secondary | ICD-10-CM | POA: Diagnosis not present

## 2023-03-07 LAB — COMPLETE METABOLIC PANEL WITH GFR
AG Ratio: 1.6 (calc) (ref 1.0–2.5)
ALT: 6 U/L (ref 6–29)
AST: 13 U/L (ref 10–35)
Albumin: 4.3 g/dL (ref 3.6–5.1)
Alkaline phosphatase (APISO): 48 U/L (ref 37–153)
BUN/Creatinine Ratio: 19 (calc) (ref 6–22)
BUN: 21 mg/dL (ref 7–25)
CO2: 29 mmol/L (ref 20–32)
Calcium: 11.1 mg/dL — ABNORMAL HIGH (ref 8.6–10.4)
Chloride: 104 mmol/L (ref 98–110)
Creat: 1.12 mg/dL — ABNORMAL HIGH (ref 0.60–1.00)
Globulin: 2.7 g/dL (ref 1.9–3.7)
Glucose, Bld: 107 mg/dL — ABNORMAL HIGH (ref 65–99)
Potassium: 4.5 mmol/L (ref 3.5–5.3)
Sodium: 140 mmol/L (ref 135–146)
Total Bilirubin: 0.5 mg/dL (ref 0.2–1.2)
Total Protein: 7 g/dL (ref 6.1–8.1)
eGFR: 52 mL/min/{1.73_m2} — ABNORMAL LOW (ref >=60)

## 2023-03-07 LAB — CBC WITH DIFFERENTIAL/PLATELET
Absolute Lymphocytes: 1463 {cells}/uL (ref 850–3900)
Absolute Monocytes: 570 {cells}/uL (ref 200–950)
Basophils Absolute: 67 {cells}/uL (ref 0–200)
Basophils Relative: 0.7 %
Eosinophils Absolute: 114 {cells}/uL (ref 15–500)
Eosinophils Relative: 1.2 %
HCT: 45.3 % — ABNORMAL HIGH (ref 35.0–45.0)
Hemoglobin: 14.5 g/dL (ref 11.7–15.5)
MCH: 29.4 pg (ref 27.0–33.0)
MCHC: 32 g/dL (ref 32.0–36.0)
MCV: 91.7 fL (ref 80.0–100.0)
MPV: 10.9 fL (ref 7.5–12.5)
Monocytes Relative: 6 %
Neutro Abs: 7287 {cells}/uL (ref 1500–7800)
Neutrophils Relative %: 76.7 %
Platelets: 311 10*3/uL (ref 140–400)
RBC: 4.94 10*6/uL (ref 3.80–5.10)
RDW: 14.5 % (ref 11.0–15.0)
Total Lymphocyte: 15.4 %
WBC: 9.5 10*3/uL (ref 3.8–10.8)

## 2023-03-07 LAB — SEDIMENTATION RATE: Sed Rate: 11 mm/h (ref 0–30)

## 2023-03-15 ENCOUNTER — Encounter: Payer: Self-pay | Admitting: Internal Medicine

## 2023-05-02 ENCOUNTER — Other Ambulatory Visit: Payer: Self-pay | Admitting: Internal Medicine

## 2023-05-02 DIAGNOSIS — K682 Retroperitoneal fibrosis: Secondary | ICD-10-CM

## 2023-05-02 NOTE — Telephone Encounter (Signed)
 Last Fill: 12/13/2022  Next Visit: 06/04/2023  Last Visit: 03/06/2023  Dx: Retroperitoneal fibrosis   Current Dose per office note on 03/06/2023: prednisone 2.5 mg daily   Okay to refill Prednisone?

## 2023-05-02 NOTE — Telephone Encounter (Signed)
 Last Fill: 12/13/2022  Next Visit: 06/04/2023  Last Visit: 03/06/2023  Dx: Retroperitoneal fibrosis   Current Dose per office note on 03/06/2023: prednisone 5 mg daily   Okay to refill Prednisone?

## 2023-05-22 ENCOUNTER — Other Ambulatory Visit: Payer: Self-pay | Admitting: Internal Medicine

## 2023-05-22 DIAGNOSIS — K682 Retroperitoneal fibrosis: Secondary | ICD-10-CM

## 2023-05-22 NOTE — Progress Notes (Signed)
 Office Visit Note  Patient: Autumn Duran             Date of Birth: [birth year]           MRN: [id number]             PCP: Orlena Bitters, MD Referring: Orlena Bitters, MD Visit Date: 06/04/2023   Subjective:  Follow-up (Patient states her right pinky has been bothering her. Patient states her right foot has been burning and aching. )   History of Present Illness:   Discussed the use of AI scribe software for clinical note transcription with the patient, who gave verbal consent to proceed.  History of Present Illness   Autumn Duran is a 73 y.o. female here for follow up for retroperitoneal fibrosis complicated by low back pain and urinary obstruction currently on azathioprine 100 mg daily prednisone 2.5 mg daily.   She experiences swelling and pain in the right pinky, which she attributes to arthritis. The symptoms worsen with certain dietary choices, particularly when consuming a lot of meat. The duration of these symptoms has been ongoing for a while, although she does not specify an exact timeframe.  She reports pain and burning sensations in the toes of her left foot. These symptoms have been present for a while, but no specific duration is provided. She mentions a history of surgery that led to sepsis, after which she was informed that she might experience lifelong numbness in one of her toes. Occasionally, this toe bothers her, and she describes a burning sensation in the area.  She experiences soreness in her hip, which she associates with muscular inflammation. 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 She describes the soreness as being present for a couple of days following these activities.  She has a history of osteoarthritis, particularly in her hands, where she has developed Heberden's nodes. She recalls her mother also complaining about  hand pain, suggesting a possible genetic component. She is currently on a low-dose steroid, which may contribute to skin fragility.  She babysits three days a week and has been avoiding contact with children who are currently sick with the flu and COVID. No urinary trouble, extra swelling in the legs, or recent illnesses.       Previous HPI 03/06/2023 Autumn Duran is a 73 y.o. female here for follow up for retroperitoneal fibrosis complicated by low back pain and urinary obstruction currently on azathioprine 100 mg daily prednisone 5 mg daily.  She was recently hospitalized for pneumonia. She describes the experience as "horrible," noting that it took two weeks to regain her strength. The patient reports no current respiratory symptoms, such as coughing or expectoration, but does mention a general body ache during the pneumonia episode.   The patient also has a history of chronic kidney disease. She reports that her kidney function numbers have been fluctuating, but her most recent GFR was in the fifties, which is close to her baseline. She is currently on Imuran for her retroperitoneal fibrosis and has not reported any issues with this medication.   The patient denies any current pain in her back or flank, but does mention a slight soreness in the middle of her back. She also reports feeling generally achy on some days, but attributes this to her age. She has not had any other infections or needed antibiotics since her  hospitalization for pneumonia.      Previous HPI 11/21/2022 Autumn Duran is a 73 y.o. female here for follow up for retroperitoneal fibrosis complicated by low back pain and urinary obstruction currently on azathioprine 100 mg daily prednisone 2.5 mg daily.  We increased medication dose after the last visit due to markedly elevated sedimentation rate although was not having additional focal symptoms.  She had recent event falling with a right elbow fracture this occurred after  trying to turn quickly while holding items in both hands for a school function.  Also has a some associated right rib or otherwise chest wall pain.  He is having poor sleep and associated fatigue describes repeat waking throughout the night about once every hour.  Not associated with needing to urinate or pain.  She has not discussed this with her PCP office or had any sleep medicine evaluation.   Previous HPI 08/15/2022 Autumn Duran is a 73 y.o. female here for follow up for retroperitoneal fibrosis complicated by low back pain and urinary obstruction currently on azathioprine 50 mg daily prednisone 2.5 mg daily.  Since last visit she had repeat ultrasound suggestive for slight worsening of hydronephrosis.  However CT scan looked stable.  She feels significantly worse on the lower dose than she did on 5 mg of prednisone.  She took a slightly increased dose when dealing with some upper respiratory symptoms recently which was more helpful for back pain. She is not noticing any abdominal or flank pain symptoms.   Previous HPI 05/16/22 Autumn Duran is a 73 y.o. female here for follow up for retroperitoneal fibrosis on azathioprine 50 mg daily and prednisone 2.5 mg daily.  Since her last visit she did not notice any increase in abdominal or flank pain or urinary symptoms.  Has noticed some increase in joint pain and swelling at the right fifth PIP joint.  She takes ibuprofen very occasionally which helps the symptoms takes Tylenol sometimes it is not very helpful.  She did get sick with a sinus infection treated with a short course of Levaquin with good resolution.  Currently having congestion and drainage with nonproductive cough again related to seasonal allergies.  She is scheduled for repeat kidney ultrasound around the end of April with May follow-up in urology clinic.   Previous HPI 02/07/22 Autumn Duran is a 73 y.o. female here for follow up for retroperitoneal fibrosis on AZA 100 mg daily and  prednisone 2.5 mg daily. She is limited but generalized arthritis pain when tapering prednisone previously. She had some intermittent right lower quadrant pain but none on most days. Recent labs in urology clinic showed normal urinalysis and kidneys appeared with chronic hydronephrosis change but stable on ultrasound appearance. She had recent labs checked with her PCP office a few days ago.   Previous HPI 11/09/21 Autumn Duran is a 73 y.o. female here for follow up for retroperitoneal fibrosis on azathioprine 100 mg daily and prednisone 2.5 mg daily. Since our last visit she saw found to have severe hydronephrosis bilaterally and bilateral obstructing nephrolithiasis. Stent placement and stone removal and subsequent ultrasound showed very good improvement in swelling. Her flank pains also resolved after treatment of these stones.   Previous HPI 08/02/2021 Autumn Duran is a 72 y.o. female here for follow up for retroperitoneal fibrosis on azathioprine 100 mg daily. She is doing well overall she had one episode of right flank and lower abdominal pain lasting about 1 week in duration. She  also started Diovan 80-12.5mg  for hypertension with improvement in headaches and blood pressure. She is not requiring and furosemide and no recent edema. She does report some increase in urinary frequency during the day times, with occasional leaking during coughing or sneezing. No pain with urination though. She does not recall exactly when this started.     Previous HPI 05/02/2021 Autumn Duran is a 73 y.o. female here for follow up for retroperitoneal fibrosis on azathioprine 100 mg daily. She discontinued prednisone after tapering she reported some increased pain in her hand and bones. This started about 3 weeks after she stopped the 2.5 mg prednisone. She also noticed a large increase in pedal edema with stopping prednisone that cleared up after 3 doses of lasix prescribed from PCP office. She has some hand pain  worst in the 5th fingers and low back pain. Her left leg and knee gave her some trouble also more stiffness and weakness than pain. She resumed taking prednisone 2.5 mg about 2 weeks ago with a large improvement in symptoms again.  She had one episode of right sided abdominal pain starting to the side and moving to the center in lower abdomen then resolved. She took one percocet due to severity of pain. She never saw any frank blood or stone during this.   Previous HPI 01/31/21 Autumn Duran is a 73 y.o. female here for follow up of retroperitoneal fibrosis with hydronephrosis on azathioprine 100 mg PO daily and prednisone 5 mg daily. She tapered prednisone down from 15 mg to 5 mg daily since last visit without noticing specific problems. She had URI symptoms with congestion and drainage treated with amoxicillin. Weight gain with central adiposity increase she reports 16 pounds total. She notices some sensitivity with monitor bumps playing with her grandson. She is scheduled for renal ultrasound on 12/8 for monitoring by Dr. Claretta Croft.   Previous HPI 10/11/20 Autumn Duran is a 72 y.o. female here for follow up for retroperitoneal fibrosis on prednisone 15 mg daily and azathioprine 100 mg p.o. daily.  Since our last visit she became ill with COVID-pneumonia symptoms took the paxlovid treatment for this and respiratory symptoms recovered completely.  She feels overall well recovered from the problem.  The left-sided chest wall and flank pain and left arm pain from her previous fall are also improved further.  She is noticing some swelling that comes and goes in her lower extremities more than before.  Lower abdominal skin rash is improved   Review of Systems  Constitutional:  Negative for fatigue.  HENT:  Positive for mouth dryness. Negative for mouth sores.   Eyes:  Positive for dryness.  Respiratory:  Negative for shortness of breath.   Cardiovascular:  Negative for chest pain and palpitations.   Gastrointestinal:  Positive for constipation. Negative for blood in stool and diarrhea.  Endocrine: Negative for increased urination.  Genitourinary:  Negative for involuntary urination.  Musculoskeletal:  Positive for joint pain and joint pain. Negative for gait problem, joint swelling, myalgias, muscle weakness, morning stiffness, muscle tenderness and myalgias.  Skin:  Positive for color change. Negative for rash, hair loss and sensitivity to sunlight.  Allergic/Immunologic: Positive for susceptible to infections.  Neurological:  Negative for dizziness and headaches.  Hematological:  Negative for swollen glands.  Psychiatric/Behavioral:  Positive for sleep disturbance. Negative for depressed mood. The patient is not nervous/anxious.     PMFS History:  Patient Active Problem List   Diagnosis Date Noted   Right lower  lobe pneumonia 01/26/2023   Acute pyelonephritis 01/26/2023   Otitis externa 01/26/2023   Acute kidney injury superimposed on CKD (HCC) 01/26/2023   Dehydration 01/26/2023   Hypoalbuminemia due to protein-calorie malnutrition (HCC) 01/26/2023   Mixed hyperlipidemia 01/26/2023   CAP (community acquired pneumonia) 01/26/2023   Closed fracture of right elbow 11/21/2022   Urinary frequency 08/02/2021   Osteoarthritis of both hands 05/02/2021   COVID-19 10/11/2020   Right-sided back pain 10/11/2020   Left-sided chest wall pain 08/09/2020   High risk medication use 08/09/2020   Varicose veins of leg with swelling, bilateral 07/05/2020   Primary hypercholesterolemia 05/11/2020   Compression fracture of L1 lumbar vertebra (HCC) 05/11/2020   Bilateral nephrolithiasis 05/11/2020   Bilateral hydronephrosis 04/14/2020   Retroperitoneal fibrosis 03/09/2020   Degenerative scoliosis 03/05/2020   Degenerative spondylolisthesis 03/05/2020   Opioid dependence (HCC) 09/18/2019   Elevated blood-pressure reading, without diagnosis of hypertension 01/08/2019   Closed fracture of  proximal end of right humerus 06/26/2018   Paraesophageal hernia 12/12/2017   Osteoporosis 12/10/2017   Dysphagia 06/26/2017   Gastroesophageal reflux disease 06/26/2017   Hx of colonic polyps 06/26/2017   Nausea with vomiting 10/12/2014   Acute low back pain 08/13/2014   Epidural abscess 08/13/2014   Hypertension    Chronic back pain    Essential hypertension    Other iron deficiency anemia 04/10/2014   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/2014   Right hip pain 12/23/2013   Epidural abscess, L2-L5 11/04/2013   Hypokalemia 08/20/2013   Lumbar compression fracture (HCC) 07/12/2013   Compression fracture 07/12/2013   Benign neoplasm of colon 12/18/2011   Esophageal reflux 12/18/2011   Other dysphagia 12/18/2011    Past Medical History:  Diagnosis Date   Bronchitis, chronic (HCC)    Cancer (HCC)    Chronic back pain    Chronic kidney disease    kidney stone s/p stent placement ( removed)   Complication of anesthesia    Compression fracture of L3 lumbar vertebra    Diverticulosis    Elbow fracture 10/2022   GERD (gastroesophageal reflux disease)    Hemorrhoids    Hiatal hernia    History of kidney stones    Hx of adenomatous colonic polyps    Hyperlipidemia    Hypertension    Lumbar radicular pain    Osteoporosis 12/10/2017   PONV (postoperative nausea and vomiting)    Sore throat    T7 vertebral fracture (HCC)     Family History  Problem Relation Age of Onset   Breast cancer Sister 39   Breast cancer Other 9       aunt   Hypertension Mother    COPD Mother    COPD Father    Hypertension Sister    Aortic aneurysm Sister    Hypertension Son    Past Surgical History:  Procedure Laterality Date   BACK SURGERY     BONE MARROW ASPIRATION Left 02/2014   BONE MARROW BIOPSY Left 02/2014   CHOLECYSTECTOMY     COLONOSCOPY  12/18/2011   Procedure: COLONOSCOPY;  Surgeon: Pietro Bridegroom, MD;  Location: [facility];  Service: Endoscopy;  Laterality: N/A;   COLONOSCOPY WITH  PROPOFOL N/A 08/03/2017   Procedure: COLONOSCOPY WITH PROPOFOL;  Surgeon: Ruby Corporal, MD;  Location: [facility];  Service: Endoscopy;  Laterality: N/A;   CYSTOSCOPY W/ URETERAL STENT PLACEMENT Bilateral 08/18/2021   Procedure: CYSTOSCOPY WITH  BILATERAL RETROGRADE PYELOGRAM BILATERAL Philipp Brawn STENT PLACEMENT;  Surgeon: Marco Severs,  MD;  Location: [facility];  Service: Urology;  Laterality: Bilateral;   CYSTOSCOPY WITH RETROGRADE PYELOGRAM, URETEROSCOPY AND STENT PLACEMENT Bilateral 09/01/2021   Procedure: CYSTOSCOPY WITH  bilateral RETROGRADE PYELOGRAM, URETEROSCOPY AND  bilateral STENT exchange;  Surgeon: Marco Severs, MD;  Location: [facility];  Service: Urology;  Laterality: Bilateral;   CYSTOSCOPY/RETROGRADE/URETEROSCOPY/STONE EXTRACTION WITH BASKET Bilateral 04/18/2018   Procedure: CYSTOSCOPY/BILATERAL RETROGRADE/URETEROSCOPY/STONE EXTRACTION WITH BASKET WITH RIGHT STENT PLACEMENT;  Surgeon: Marco Severs, MD;  Location: [facility];  Service: Urology;  Laterality: Bilateral;  1 HR   ESOPHAGEAL DILATION N/A 08/03/2017   Procedure: ESOPHAGEAL DILATION;  Surgeon: Ruby Corporal, MD;  Location: [facility];  Service: Endoscopy;  Laterality: N/A;   ESOPHAGOGASTRODUODENOSCOPY  12/18/2011   Procedure: ESOPHAGOGASTRODUODENOSCOPY (EGD);  Surgeon: Pietro Bridegroom, MD;  Location: [facility];  Service: Endoscopy;  Laterality: N/A;   ESOPHAGOGASTRODUODENOSCOPY (EGD) WITH PROPOFOL N/A 08/03/2017   Procedure: ESOPHAGOGASTRODUODENOSCOPY (EGD) WITH PROPOFOL;  Surgeon: Ruby Corporal, MD;  Location: [facility];  Service: Endoscopy;  Laterality: N/A;  pt knows to arrive at 6:15   HERNIA REPAIR     HOLMIUM LASER APPLICATION Right 04/18/2018   Procedure: HOLMIUM LASER APPLICATION;  Surgeon: Marco Severs, MD;  Location: [facility];  Service: Urology;  Laterality: Right;   HOLMIUM LASER APPLICATION Bilateral 09/01/2021   Procedure: HOLMIUM LASER APPLICATION;  Surgeon: Marco Severs, MD;   Location: [facility];  Service: Urology;  Laterality: Bilateral;   LUMBAR LAMINECTOMY FOR EPIDURAL ABSCESS Left 09/22/2013   Procedure: LUMBAR LAMINECTOMY FOR EPIDURAL ABSCESS LEFT LUMBAR FIVE-SACRAL ONE;  Surgeon: Baruch Bosch, MD;  Location: [facility];  Service: Neurosurgery;  Laterality: Left;   RADIOLOGY WITH ANESTHESIA N/A 08/13/2014   Procedure: RADIOLOGY WITH ANESTHESIA ;  Surgeon: Medication Radiologist, MD;  Location: [facility];  Service: Radiology;  Laterality: N/A;   URETERAL STENT PLACEMENT     VERTEBROPLASTY N/A 07/14/2013   Procedure: VERTEBROPLASTY WITH LUMBAR THREE BIOPSY;  Surgeon: Baruch Bosch, MD;  Location: [facility];  Service: Neurosurgery;  Laterality: N/A;  VERTEBROPLASTY WITH LUMBAR THREE BIOPSY   Social History   Social History Narrative   Not on file   There is no immunization history for the selected administration types on file for this patient.   Objective: Vital Signs: BP 109/65 (BP Location: Left Arm, Patient Position: Sitting, Cuff Size: Normal)   Pulse (!) 53   Resp 14   Ht 5' (1.524 m)   Wt 157 lb (71.2 kg)   BMI 30.66 kg/m    Physical Exam Eyes:     Conjunctiva/sclera: Conjunctivae normal.  Cardiovascular:     Rate and Rhythm: Normal rate and regular rhythm.  Pulmonary:     Effort: Pulmonary effort is normal.     Breath sounds: Normal breath sounds.  Musculoskeletal:     Right lower leg: No edema.     Left lower leg: No edema.  Lymphadenopathy:     Cervical: No cervical adenopathy.  Skin:    General: Skin is warm and dry.     Comments: Mild scattered bruises  Neurological:     Mental Status: She is alert.  Psychiatric:        Mood and Affect: Mood normal.      Musculoskeletal Exam:  Elbows full ROM no tenderness or swelling Wrists full ROM no tenderness or swelling Fingers distal Heberden's nodes throughout both hands, bony widening of right fifth PIP with decreased extension range of motion Tenderness  to pressure over bilateral  paraspinal muscles of her lower thoracic and lumbar spine without radiation Knees full ROM no tenderness or swelling, bilateral patellofemoral crepitus   Investigation: No additional findings.  Imaging: No results found.  Recent Labs: Lab Results  Component Value Date   WBC 6.5 06/04/2023   HGB 13.9 06/04/2023   PLT 281 06/04/2023   NA 142 06/04/2023   K 4.0 06/04/2023   CL 103 06/04/2023   CO2 28 06/04/2023   GLUCOSE 89 06/04/2023   BUN 23 06/04/2023   CREATININE 1.13 (H) 06/04/2023   BILITOT 0.3 06/04/2023   ALKPHOS 54 01/27/2023   AST 18 06/04/2023   ALT 6 06/04/2023   PROT 6.9 06/04/2023   ALBUMIN 2.6 (L) 01/27/2023   CALCIUM 10.5 (H) 06/04/2023   GFRAA 67 08/09/2020    Speciality Comments: No specialty comments available.  Procedures:  No procedures performed Allergies: Chlorhexidine gluconate, Morphine and codeine, and Tetracycline   Assessment / Plan:     Visit Diagnoses: Retroperitoneal fibrosis - Plan: Sedimentation rate, azaTHIOprine (IMURAN) 50 MG tablet Appears to be under control with normal renal imaging, stable labs. No change in abdominal symptoms does have back pain seemingly more DDD/muscular. -Checking sedimentation rate for disease activity monitoring -Continue Imuran 100 mg daily -Continue prednisone 2.5 mg daily  High risk medication use - azathioprine 100 mg daily - Plan: CBC with Differential/Platelet, Comprehensive metabolic panel with GFR -Checking CBC and CMP for medication monitoring on continued long-term use of azathioprine and prednisone also for renal function monitoring with intermittent obstructive symptoms   Long term (current) use of systemic steroids - prednisone 2.5 mg daily  Chronic midline low back pain without sciatica - Continues on low-dose chronic pain medication for this. - Plan: XR Lumbar Spine 2-3 Views, XR Thoracic Spine 2 View Muscular back pain Muscular strain from physical activities, not inflammatory or  arthritic. Spine surgery and abnormal architecture may contribute to pain. - Refer to physical therapy for assessment and exercise regimen. - Consider pain management if necessary.  Osteoarthritis Chronic osteoarthritis with joint swelling and pain, exacerbated by dietary factors. Heberden's nodes indicate degenerative changes. Bunion-related alterations and wear contribute to toe changes. Possible septic arthritis involvement. - Educated on osteoarthritis progression.  Sun sensitivity due to medication Low-dose steroid increases skin fragility and sun sensitivity. Emphasized sun protection importance, especially for beach trip. - Recommend SPF 50 product and consistent use - Advise protective clothing and regular sunscreen reapplication.        Orders: Orders Placed This Encounter  Procedures   XR Lumbar Spine 2-3 Views   XR Thoracic Spine 2 View   Sedimentation rate   CBC with Differential/Platelet   Comprehensive metabolic panel with GFR   Meds ordered this encounter  Medications   azaTHIOprine (IMURAN) 50 MG tablet    Sig: Take 2 tablets (100 mg total) by mouth daily.    Dispense:  180 tablet    Refill:  0     Follow-Up Instructions: Return in about 3 months (around 09/03/2023) for RPF on AZA/GC f/u 3mos.   Matt Song, MD  Note - This record has been created using [employer].  Chart creation errors have been sought, but may not always  have been located. Such creation errors do not reflect on  the standard of medical care.

## 2023-05-22 NOTE — Telephone Encounter (Signed)
 Last Fill: 03/06/2023  Labs: 03/06/2023 Her blood count is normal.  Kidney function remains good with estimated GFR of 52.  Sedimentation rate remains normal at 11 which is great.  She can continue prednisone 2.5 mg once daily.  Continue azathioprine 100 mg daily   Next Visit: 06/04/2023  Last Visit: 03/06/2023  DX:  Retroperitoneal fibrosis   Current Dose per office note 03/06/2023: Imuran 100 mg daily   Okay to refill Imuran?

## 2023-06-04 ENCOUNTER — Encounter: Payer: Self-pay | Admitting: Internal Medicine

## 2023-06-04 ENCOUNTER — Ambulatory Visit

## 2023-06-04 ENCOUNTER — Ambulatory Visit: Payer: Medicare HMO | Attending: Internal Medicine | Admitting: Internal Medicine

## 2023-06-04 VITALS — BP 109/65 | HR 53 | Resp 14 | Ht 60.0 in | Wt 157.0 lb

## 2023-06-04 DIAGNOSIS — Z7952 Long term (current) use of systemic steroids: Secondary | ICD-10-CM | POA: Diagnosis not present

## 2023-06-04 DIAGNOSIS — K682 Retroperitoneal fibrosis: Secondary | ICD-10-CM

## 2023-06-04 DIAGNOSIS — M545 Low back pain, unspecified: Secondary | ICD-10-CM

## 2023-06-04 DIAGNOSIS — G8929 Other chronic pain: Secondary | ICD-10-CM | POA: Diagnosis not present

## 2023-06-04 DIAGNOSIS — Z79899 Other long term (current) drug therapy: Secondary | ICD-10-CM

## 2023-06-04 MED ORDER — AZATHIOPRINE 50 MG PO TABS: 100.0000 mg | ORAL_TABLET | Freq: Every day | ORAL | 0 refills | Status: DC

## 2023-06-05 LAB — COMPREHENSIVE METABOLIC PANEL WITH GFR
AG Ratio: 1.7 (calc) (ref 1.0–2.5)
ALT: 6 U/L (ref 6–29)
AST: 18 U/L (ref 10–35)
Albumin: 4.3 g/dL (ref 3.6–5.1)
Alkaline phosphatase (APISO): 47 U/L (ref 37–153)
BUN/Creatinine Ratio: 20 (calc) (ref 6–22)
BUN: 23 mg/dL (ref 7–25)
CO2: 28 mmol/L (ref 20–32)
Calcium: 10.5 mg/dL — ABNORMAL HIGH (ref 8.6–10.4)
Chloride: 103 mmol/L (ref 98–110)
Creat: 1.13 mg/dL — ABNORMAL HIGH (ref 0.60–1.00)
Globulin: 2.6 g/dL (ref 1.9–3.7)
Glucose, Bld: 89 mg/dL (ref 65–99)
Potassium: 4 mmol/L (ref 3.5–5.3)
Sodium: 142 mmol/L (ref 135–146)
Total Bilirubin: 0.3 mg/dL (ref 0.2–1.2)
Total Protein: 6.9 g/dL (ref 6.1–8.1)
eGFR: 52 mL/min/{1.73_m2} — ABNORMAL LOW (ref >=60)

## 2023-06-05 LAB — CBC WITH DIFFERENTIAL/PLATELET
Absolute Lymphocytes: 1482 {cells}/uL (ref 850–3900)
Absolute Monocytes: 468 {cells}/uL (ref 200–950)
Basophils Absolute: 59 {cells}/uL (ref 0–200)
Basophils Relative: 0.9 %
Eosinophils Absolute: 182 {cells}/uL (ref 15–500)
Eosinophils Relative: 2.8 %
HCT: 42.5 % (ref 35.0–45.0)
Hemoglobin: 13.9 g/dL (ref 11.7–15.5)
MCH: 30.2 pg (ref 27.0–33.0)
MCHC: 32.7 g/dL (ref 32.0–36.0)
MCV: 92.2 fL (ref 80.0–100.0)
MPV: 10.8 fL (ref 7.5–12.5)
Monocytes Relative: 7.2 %
Neutro Abs: 4310 {cells}/uL (ref 1500–7800)
Neutrophils Relative %: 66.3 %
Platelets: 281 10*3/uL (ref 140–400)
RBC: 4.61 10*6/uL (ref 3.80–5.10)
RDW: 13.7 % (ref 11.0–15.0)
Total Lymphocyte: 22.8 %
WBC: 6.5 10*3/uL (ref 3.8–10.8)

## 2023-06-05 LAB — SEDIMENTATION RATE: Sed Rate: 22 mm/h (ref 0–30)

## 2023-06-19 ENCOUNTER — Telehealth: Payer: Self-pay | Admitting: *Deleted

## 2023-06-19 NOTE — Telephone Encounter (Signed)
 Patient contacted the office and left message stating she was seen on 06/04/2023. Patient would like her lab and x-ray results. Please review and advise.

## 2023-06-20 NOTE — Telephone Encounter (Signed)
 Patient called again regarding x-ray and lab results from 06/04/2023. Patient advised you are out of the office today but I would send a message. Thanks!

## 2023-06-26 DIAGNOSIS — K5903 Drug induced constipation: Secondary | ICD-10-CM | POA: Diagnosis not present

## 2023-06-26 DIAGNOSIS — F1120 Opioid dependence, uncomplicated: Secondary | ICD-10-CM | POA: Diagnosis not present

## 2023-06-26 DIAGNOSIS — M961 Postlaminectomy syndrome, not elsewhere classified: Secondary | ICD-10-CM | POA: Diagnosis not present

## 2023-07-10 DIAGNOSIS — R0981 Nasal congestion: Secondary | ICD-10-CM | POA: Diagnosis not present

## 2023-07-10 DIAGNOSIS — I10 Essential (primary) hypertension: Secondary | ICD-10-CM | POA: Diagnosis not present

## 2023-07-10 DIAGNOSIS — J069 Acute upper respiratory infection, unspecified: Secondary | ICD-10-CM | POA: Diagnosis not present

## 2023-08-06 ENCOUNTER — Ambulatory Visit (HOSPITAL_COMMUNITY)
Admission: RE | Admit: 2023-08-06 | Discharge: 2023-08-06 | Disposition: A | Discharge status: Home / Self Care | Source: Ambulatory Visit | Attending: Urology | Admitting: Urology

## 2023-08-06 DIAGNOSIS — N200 Calculus of kidney: Secondary | ICD-10-CM | POA: Insufficient documentation

## 2023-08-06 DIAGNOSIS — N261 Atrophy of kidney (terminal): Secondary | ICD-10-CM | POA: Diagnosis not present

## 2023-08-07 ENCOUNTER — Ambulatory Visit: Payer: Self-pay | Admitting: Internal Medicine

## 2023-08-07 ENCOUNTER — Other Ambulatory Visit: Payer: Self-pay | Admitting: Internal Medicine

## 2023-08-07 ENCOUNTER — Other Ambulatory Visit: Payer: Self-pay | Admitting: Hematology

## 2023-08-07 DIAGNOSIS — K682 Retroperitoneal fibrosis: Secondary | ICD-10-CM

## 2023-08-07 DIAGNOSIS — E559 Vitamin D deficiency, unspecified: Secondary | ICD-10-CM

## 2023-08-07 NOTE — Progress Notes (Signed)
 Creatinine is stable with eGFR of 52 showing no change in kidney function. Calcium  number is mostly improved down to 10.5. Sed rate and blood counts are normal. Xrays of the back redemonstrated multilevel degenerative changes and kyphoplasty changes but no acute new abnormality.

## 2023-08-07 NOTE — Telephone Encounter (Signed)
 Last Fill: 05/02/2023  Next Visit: 09/04/2023  Last Visit: 06/04/2023  Dx: Retroperitoneal fibrosis   Current Dose per office note on 06/04/2023: Continue prednisone  2.5 mg daily   Okay to refill Prednisone ?

## 2023-08-15 ENCOUNTER — Encounter: Payer: Self-pay | Admitting: Urology

## 2023-08-15 ENCOUNTER — Ambulatory Visit: Payer: Medicare HMO | Admitting: Urology

## 2023-08-15 VITALS — BP 100/57 | HR 55

## 2023-08-15 DIAGNOSIS — N200 Calculus of kidney: Secondary | ICD-10-CM

## 2023-08-15 DIAGNOSIS — K682 Retroperitoneal fibrosis: Secondary | ICD-10-CM

## 2023-08-15 LAB — URINALYSIS, ROUTINE W REFLEX MICROSCOPIC
Bilirubin, UA: NEGATIVE
Glucose, UA: NEGATIVE
Ketones, UA: NEGATIVE
Nitrite, UA: NEGATIVE
Protein,UA: NEGATIVE
Specific Gravity, UA: 1.015 (ref 1.005–1.030)
Urobilinogen, Ur: 0.2 mg/dL (ref 0.2–1.0)
pH, UA: 7 (ref 5.0–7.5)

## 2023-08-15 LAB — MICROSCOPIC EXAMINATION: Bacteria, UA: NONE SEEN

## 2023-08-15 NOTE — Patient Instructions (Signed)
 Hydronephrosis  Hydronephrosis is the swelling of one or both kidneys due to a blockage that stops urine from flowing out of the body. Kidneys filter waste from the blood and produce urine. This condition can lead to kidney failure and may become life-threatening if not treated promptly. What are the causes? In infants and children, common causes include problems that occur when a baby is developing in the womb. These can include problems in the kidneys or in the tubes that drain urine into the bladder (ureters). In adults, common causes include: Kidney stones. Pregnancy. A tumor or cyst in the abdomen or pelvis. An enlarged prostate gland. Other causes include: Bladder infection. Scar tissue from a previous surgery or injury. A blood clot. Cancer of the prostate, bladder, uterus, ovary, or colon. What are the signs or symptoms? Symptoms of this condition include: Pain or discomfort in your side (flank) or abdomen. Swelling in your abdomen. Nausea and vomiting. Fever. Pain when passing urine. Feelings of urgency when you need to urinate. Urinating more often than normal. In some cases, you may not have any symptoms. How is this diagnosed? This condition may be diagnosed based on: Your symptoms and medical history. A physical exam. Blood and urine tests. Imaging tests, such as an ultrasound, CT scan, or MRI. A procedure to look at your urinary tract and bladder by inserting a scope into the urethra (cystoscopy). How is this treated? Treatment for this condition depends on where the blockage is, how long it has been there, and what caused it. The goal of treatment is to remove the blockage. Treatment may include: Antibiotic medicines to treat or prevent infection. A procedure to place a small, thin tube (stent) into a blocked ureter. The stent will keep the ureter open so that urine can drain through it. A nonsurgical procedure that crushes kidney stones with shock waves  (extracorporeal shock wave lithotripsy). If kidney failure occurs, treatment may include dialysis or a kidney transplant. Follow these instructions at home:  Take over-the-counter and prescription medicines only as told by your health care provider. If you were prescribed an antibiotic medicine, take it exactly as told by your health care provider. Do not stop taking the antibiotic even if you start to feel better. Rest and return to your normal activities as told by your health care provider. Ask your health care provider what activities are safe for you. Drink enough fluid to keep your urine pale yellow. Keep all follow-up visits. This is important. Contact a health care provider if: You continue to have symptoms after treatment. You develop new symptoms. Your urine becomes cloudy or bloody. You have a fever. Get help right away if: You have severe flank or abdominal pain. You cannot drink fluids without vomiting. Summary Hydronephrosis is the swelling of one or both kidneys due to a blockage that stops urine from flowing out of the body. Hydronephrosis can lead to kidney failure and may become life-threatening if not treated promptly. The goal of treatment is to remove the blockage. It may include a procedure to insert a stent into a blocked ureter, a procedure to break up kidney stones, or taking antibiotic medicines. Follow your health care provider's instructions for taking care of yourself at home, including instructions about drinking fluids, taking medicines, and limiting activities. This information is not intended to replace advice given to you by your health care provider. Make sure you discuss any questions you have with your health care provider. Document Revised: 11/14/2022 Document Reviewed: 11/14/2022 Elsevier  Patient Education  2024 [employer].

## 2023-08-15 NOTE — Progress Notes (Signed)
 08/15/2023 3:19 PM   Autumn Duran 28-Mar-1950 [id number]  Referring provider: Orlena Bitters, MD [address] [place],  [place] [address]  Followup nephrolithiasis and hydronephrosis   HPI: Autumn Duran is a 72yo here for followup for nephrolithiasis and retroperitoneal fibrosis. Renal US  shows mild right hydronephrosis and moderate left hydronephrosis. Creatinine stable at 1.12. She has mild intermittent bilateral flank pain. She has mild urinary urgency but she is not bothered by her LUTS.    PMH: Past Medical History:  Diagnosis Date   Bronchitis, chronic (HCC)    Cancer (HCC)    Chronic back pain    Chronic kidney disease    kidney stone s/p stent placement ( removed)   Complication of anesthesia    Compression fracture of L3 lumbar vertebra    Diverticulosis    Elbow fracture 10/2022   GERD (gastroesophageal reflux disease)    Hemorrhoids    Hiatal hernia    History of kidney stones    Hx of adenomatous colonic polyps    Hyperlipidemia    Hypertension    Lumbar radicular pain    Osteoporosis 12/10/2017   PONV (postoperative nausea and vomiting)    Sore throat    T7 vertebral fracture [facility])     Surgical History: Past Surgical History:  Procedure Laterality Date   BACK SURGERY     BONE MARROW ASPIRATION Left 02/2014   BONE MARROW BIOPSY Left 02/2014   CHOLECYSTECTOMY     COLONOSCOPY  12/18/2011   Procedure: COLONOSCOPY;  Surgeon: Pietro Bridegroom, MD;  Location: [facility];  Service: Endoscopy;  Laterality: N/A;   COLONOSCOPY WITH PROPOFOL  N/A 08/03/2017   Procedure: COLONOSCOPY WITH PROPOFOL ;  Surgeon: Ruby Corporal, MD;  Location: [facility];  Service: Endoscopy;  Laterality: N/A;   CYSTOSCOPY W/ URETERAL STENT PLACEMENT Bilateral 08/18/2021   Procedure: CYSTOSCOPY WITH  BILATERAL RETROGRADE PYELOGRAM BILATERAL Philipp Brawn STENT PLACEMENT;  Surgeon: Marco Severs, MD;  Location: [facility];  Service: Urology;  Laterality: Bilateral;   CYSTOSCOPY WITH RETROGRADE  PYELOGRAM, URETEROSCOPY AND STENT PLACEMENT Bilateral 09/01/2021   Procedure: CYSTOSCOPY WITH  bilateral RETROGRADE PYELOGRAM, URETEROSCOPY AND  bilateral STENT exchange;  Surgeon: Marco Severs, MD;  Location: [facility];  Service: Urology;  Laterality: Bilateral;   CYSTOSCOPY/RETROGRADE/URETEROSCOPY/STONE EXTRACTION WITH BASKET Bilateral 04/18/2018   Procedure: CYSTOSCOPY/BILATERAL RETROGRADE/URETEROSCOPY/STONE EXTRACTION WITH BASKET WITH RIGHT STENT PLACEMENT;  Surgeon: Marco Severs, MD;  Location: [facility];  Service: Urology;  Laterality: Bilateral;  1 HR   ESOPHAGEAL DILATION N/A 08/03/2017   Procedure: ESOPHAGEAL DILATION;  Surgeon: Ruby Corporal, MD;  [facility];  Service: Endoscopy;  Laterality: N/A;   ESOPHAGOGASTRODUODENOSCOPY  12/18/2011   Procedure: ESOPHAGOGASTRODUODENOSCOPY (EGD);  Surgeon: Pietro Bridegroom, MD;  Location: [facility];  Service: Endoscopy;  Laterality: N/A;   ESOPHAGOGASTRODUODENOSCOPY (EGD) WITH PROPOFOL  N/A 08/03/2017   Procedure: ESOPHAGOGASTRODUODENOSCOPY (EGD) WITH PROPOFOL ;  Surgeon: Ruby Corporal, MD;  [facility];  Service: Endoscopy;  Laterality: N/A;  pt knows to arrive at 6:15   HERNIA REPAIR     HOLMIUM LASER APPLICATION Right 04/18/2018   Procedure: HOLMIUM LASER APPLICATION;  Surgeon: Marco Severs, MD;  Location: [facility];  Service: Urology;  Laterality: Right;   HOLMIUM LASER APPLICATION Bilateral 09/01/2021   Procedure: HOLMIUM LASER APPLICATION;  Surgeon: Marco Severs, MD;  Location: [facility];  Service: Urology;  Laterality: Bilateral;   LUMBAR LAMINECTOMY FOR EPIDURAL ABSCESS Left 09/22/2013   Procedure: LUMBAR LAMINECTOMY FOR  EPIDURAL ABSCESS LEFT LUMBAR FIVE-SACRAL ONE;  Surgeon: Baruch Bosch, MD;  Location: [facility];  Service: Neurosurgery;  Laterality: Left;   RADIOLOGY WITH ANESTHESIA N/A 08/13/2014   Procedure: RADIOLOGY WITH ANESTHESIA ;  Surgeon: Medication Radiologist, MD;  Location: [facility];   Service: Radiology;  Laterality: N/A;   URETERAL STENT PLACEMENT     VERTEBROPLASTY N/A 07/14/2013   Procedure: VERTEBROPLASTY WITH LUMBAR THREE BIOPSY;  Surgeon: Baruch Bosch, MD;  Location: [facility];  Service: Neurosurgery;  Laterality: N/A;  VERTEBROPLASTY WITH LUMBAR THREE BIOPSY    Home Medications:  Allergies as of 08/15/2023       Reactions   Chlorhexidine  Gluconate Itching   Pt c/o itching after abd wipes.    Morphine And Codeine Nausea And Vomiting   Headache   Tetracycline Nausea Only        Medication List        Accurate as of August 15, 2023  3:19 PM. If you have any questions, ask your nurse or doctor.          acetaminophen  325 MG tablet Commonly known as: TYLENOL  Take 2 tablets (650 mg total) by mouth every 6 (six) hours as needed for mild pain (pain score 1-3) (or Fever >/= 101).   azaTHIOprine  50 MG tablet Commonly known as: IMURAN  Take 2 tablets (100 mg total) by mouth daily.   HYDROcodone -acetaminophen  5-325 MG tablet Commonly known as: NORCO/VICODIN Take 1 tablet by mouth every 6 (six) hours as needed (severe pain.).   omeprazole  20 MG capsule Commonly known as: PRILOSEC Take 20 mg by mouth daily before breakfast.   oxyCODONE  20 mg 12 hr tablet Commonly known as: OXYCONTIN  Take 20 mg by mouth in the morning.   predniSONE  5 MG tablet Commonly known as: DELTASONE  TAKE 1/2 TABLET BY MOUTH DAILY WITH BREAKFAST   Relistor 150 MG Tabs Generic drug: Methylnaltrexone Bromide TAKE 3 TABLETS BY MOUTH EVERY DAY IN THE MORNING   rosuvastatin  5 MG tablet Commonly known as: CRESTOR  Take 5 mg by mouth once a week.   valsartan-hydrochlorothiazide 80-12.5 MG tablet Commonly known as: DIOVAN-HCT Take 1 tablet by mouth daily.   Vitamin D  (Ergocalciferol ) 1.25 MG (50000 UNIT) Caps capsule Commonly known as: DRISDOL  TAKE 1 CAPSULE BY MOUTH ONCE A WEEK        Allergies:  Allergies  Allergen Reactions   Chlorhexidine  Gluconate Itching    Pt  c/o itching after abd wipes.    Morphine And Codeine Nausea And Vomiting    Headache   Tetracycline Nausea Only    Family History: Family History  Problem Relation Age of Onset   Breast cancer Sister 64   Breast cancer Other 72       aunt   Hypertension Mother    COPD Mother    COPD Father    Hypertension Sister    Aortic aneurysm Sister    Hypertension Son     Social History:  reports that she has never smoked. She has been exposed to tobacco smoke. She has never used smokeless tobacco. She reports that she does not currently use alcohol . She reports that she does not use drugs.  ROS: All other review of systems were reviewed and are negative except what is noted above in HPI  Physical Exam: BP (!) 100/57   Pulse (!) 55   Constitutional:  Alert and oriented, No acute distress. HEENT: [place] AT, moist mucus membranes.  Trachea midline, no masses. Cardiovascular: No clubbing,  cyanosis, or edema. Respiratory: Normal respiratory effort, no increased work of breathing. GI: Abdomen is soft, nontender, nondistended, no abdominal masses GU: No CVA tenderness.  Lymph: No cervical or inguinal lymphadenopathy. Skin: No rashes, bruises or suspicious lesions. Neurologic: Grossly intact, no focal deficits, moving all 4 extremities. Psychiatric: Normal mood and affect.  Laboratory Data: Lab Results  Component Value Date   WBC 6.5 06/04/2023   HGB 13.9 06/04/2023   HCT 42.5 06/04/2023   MCV 92.2 06/04/2023   PLT 281 06/04/2023    Lab Results  Component Value Date   CREATININE 1.13 (H) 06/04/2023    No results found for: PSA  No results found for: TESTOSTERONE  No results found for: HGBA1C  Urinalysis    Component Value Date/Time   COLORURINE AMBER (A) 01/27/2023 0025   APPEARANCEUR Clear 02/14/2023 1456   LABSPEC 1.016 01/27/2023 0025   PHURINE 5.0 01/27/2023 0025   GLUCOSEU Negative 02/14/2023 1456   HGBUR MODERATE (A) 01/27/2023 0025   BILIRUBINUR Negative  02/14/2023 1456   KETONESUR NEGATIVE 01/27/2023 0025   PROTEINUR Negative 02/14/2023 1456   PROTEINUR 30 (A) 01/27/2023 0025   UROBILINOGEN 0.2 08/13/2014 0850   NITRITE Negative 02/14/2023 1456   NITRITE NEGATIVE 01/27/2023 0025   LEUKOCYTESUR 1+ (A) 02/14/2023 1456   LEUKOCYTESUR TRACE (A) 01/27/2023 0025    Lab Results  Component Value Date   LABMICR See below: 02/14/2023   WBCUA 6-10 (A) 02/14/2023   LABEPIT 0-10 02/14/2023   MUCUS Present 09/08/2021   BACTERIA Moderate (A) 02/14/2023    Pertinent Imaging: Renal US  08/06/23: Images reviewed and discussed with the patient  No results found for this or any previous visit.  Results for orders placed during the hospital encounter of 12/18/13  US  Venous Img Lower Bilateral  Narrative CLINICAL DATA:  Leg swelling  EXAM: BILATERAL LOWER EXTREMITY VENOUS DOPPLER ULTRASOUND  TECHNIQUE: Gray-scale sonography with graded compression, as well as color Doppler and duplex ultrasound were performed to evaluate the lower extremity deep venous systems from the level of the common femoral vein and including the common femoral, femoral, profunda femoral, popliteal and calf veins including the posterior tibial, peroneal and gastrocnemius veins when visible. The superficial great saphenous vein was also interrogated. Spectral Doppler was utilized to evaluate flow at rest and with distal augmentation maneuvers in the common femoral, femoral and popliteal veins.  COMPARISON:  None.  FINDINGS: RIGHT LOWER EXTREMITY  Common Femoral Vein: No evidence of thrombus. Normal compressibility, respiratory phasicity and response to augmentation.  Saphenofemoral Junction: No evidence of thrombus. Normal compressibility and flow on color Doppler imaging.  Profunda Femoral Vein: No evidence of thrombus. Normal compressibility and flow on color Doppler imaging.  Femoral Vein: No evidence of thrombus. Normal compressibility, respiratory  phasicity and response to augmentation.  Popliteal Vein: No evidence of thrombus. Normal compressibility, respiratory phasicity and response to augmentation.  Calf Veins: No evidence of thrombus. Normal compressibility and flow on color Doppler imaging.  Superficial Great Saphenous Vein: No evidence of thrombus. Normal compressibility and flow on color Doppler imaging.  Venous Reflux:  None.  Other Findings:  None.  LEFT LOWER EXTREMITY  Common Femoral Vein: No evidence of thrombus. Normal compressibility, respiratory phasicity and response to augmentation.  Saphenofemoral Junction: No evidence of thrombus. Normal compressibility and flow on color Doppler imaging.  Profunda Femoral Vein: No evidence of thrombus. Normal compressibility and flow on color Doppler imaging.  Femoral Vein: No evidence of thrombus. Normal compressibility, respiratory phasicity and response to  augmentation.  Popliteal Vein: No evidence of thrombus. Normal compressibility, respiratory phasicity and response to augmentation.  Calf Veins: Not well visualized.  Superficial Great Saphenous Vein: No evidence of thrombus. Normal compressibility and flow on color Doppler imaging.  Venous Reflux:  None.  Other Findings:  None.  IMPRESSION: No evidence of deep venous thrombosis.   Electronically Signed By: Onnie Bilis On: 12/18/2013 13:37  No results found for this or any previous visit.  No results found for this or any previous visit.  Results for orders placed during the hospital encounter of 08/06/23  US  RENAL  Narrative CLINICAL DATA:  Nephrolithiasis.  EXAM: RENAL / URINARY TRACT ULTRASOUND COMPLETE  COMPARISON:  CT 01/26/2023. Ultrasound 07/04/2022. Multiple other prior examinations as well.  FINDINGS: Right Kidney:  Renal measurements: 11.4 x 5.8 x 5.8 cm = volume: 206.6 mL. Mild renal parenchymal atrophy with moderate collecting system dilatation, similar to previous CT  scan. No perinephric fluid. Lower pole stones suggested measuring up to 5 mm. This was seen on the prior CT.  Left Kidney:  Renal measurements: 10.4 x 4.5 x 5.7 cm = volume: 140.5 mL. Moderate parenchymal atrophy. Severe collecting system dilatation again seen on the prior examination. No perinephric fluid. Possible shadowing stone measured 8 mm by the sonographer but this does not clearly shadow and on prior CT scan there were only punctate stones identified in the kidney.  Bladder:  Underdistended urinary bladder.  Other:  None.  IMPRESSION: Bilateral renal atrophy with severe left and moderate right renal collecting system dilatation similar to the prior CT scan. Small stone seen bilaterally. These are not as well defined on today's ultrasound as on the prior CT scan.   Electronically Signed By: Adrianna Horde M.D. On: 08/10/2023 11:03  No results found for this or any previous visit.  No results found for this or any previous visit.  Results for orders placed during the hospital encounter of 01/26/23  CT Renal Stone Study  Narrative CLINICAL DATA:  Left flank pain  EXAM: CT ABDOMEN AND PELVIS WITHOUT CONTRAST  TECHNIQUE: Multidetector CT imaging of the abdomen and pelvis was performed following the standard protocol without IV contrast.  RADIATION DOSE REDUCTION: This exam was performed according to the departmental dose-optimization program which includes automated exposure control, adjustment of the mA and/or kV according to patient size and/or use of iterative reconstruction technique.  COMPARISON:  07/11/2022  FINDINGS: Lower chest: Patchy airspace opacity within the left lower lobe and lingula including a 2.9 cm rounded pleural based opacity with configuration favoring rounded atelectasis. Mild scattered ground-glass opacity in the right lower lobe. Heart size is normal.  Hepatobiliary: No focal liver abnormality is seen. Status  post cholecystectomy. No biliary dilatation.  Pancreas: Pancreatic atrophy. No ductal dilatation or fat stranding.  Spleen: Normal in size without focal abnormality.  Adrenals/Urinary Tract: Unremarkable adrenal glands. Numerous punctate 1-2 mm stones within both kidneys. Severe left and moderate right hydronephrosis. No ureteral dilation. No ureteral calculi. Urinary bladder is decompressed.  Stomach/Bowel: Stomach is within normal limits. Appendix appears normal. No evidence of bowel wall thickening, distention, or inflammatory changes.  Vascular/Lymphatic: Aortic atherosclerosis. No enlarged abdominal or pelvic lymph nodes.  Reproductive: Uterus and bilateral adnexa are unremarkable.  Other: No free fluid. No abdominopelvic fluid collection. No pneumoperitoneum. No abdominal wall hernia.  Musculoskeletal: Chronic L1 and L3 compression fractures. Prior L3 cement augmentation. Unchanged grade 1-2 anterolisthesis at L5-S1. No new or acute bony abnormality.  IMPRESSION: 1. Numerous punctate bilateral  renal calculi. Severe left and moderate right hydronephrosis, similar to the prior study. Consider chronic proximal ureteral obstructions, possibly secondary to retroperitoneal fibrosis. 2. Patchy airspace opacity within the left lower lobe and lingula including a 2.9 cm rounded pleural based opacity with configuration favoring rounded atelectasis. Mild scattered ground-glass opacity in the right lower lobe. Findings may represent a combination of atelectasis and infection. Follow-up CT of the chest in 3 months is recommended to assess for resolution. 3. Aortic atherosclerosis (ICD10-I70.0).   Electronically Signed By: Leverne Reading D.O. On: 01/26/2023 17:18   Assessment & Plan:    1. Nephrolithiasis (Primary) -followup 6 months with renal US  - Urinalysis, Routine w reflex microscopic  2. Retroperitoneal fibrosis Followup 6 months with renal US    No follow-ups  on file.  Johnie Nailer, MD  [facility] [place]

## 2023-08-21 NOTE — Progress Notes (Deleted)
 Office Visit Note  Patient: Autumn Duran             Date of Birth: Dec 31, 1950           MRN: [id number]             PCP: Rosamond Leta NOVAK, MD Referring: Rosamond Leta NOVAK, MD Visit Date: 09/04/2023   Subjective:  No chief complaint on file.   History of Present Illness: Autumn Duran is a 73 y.o. female here for follow up for retroperitoneal fibrosis complicated by low back pain and urinary obstruction currently on azathioprine  100 mg daily prednisone  2.5 mg daily.    Previous HPI 06/04/2023 Autumn Duran is a 73 y.o. female here for follow up for retroperitoneal fibrosis complicated by low back pain and urinary obstruction currently on azathioprine  100 mg daily prednisone  2.5 mg daily.    She experiences swelling and pain in the right pinky, which she attributes to arthritis. The symptoms worsen with certain dietary choices, particularly when consuming a lot of meat. The duration of these symptoms has been ongoing for a while, although she does not specify an exact timeframe.   She reports pain and burning sensations in the toes of her left foot. These symptoms have been present for a while, but no specific duration is provided. She mentions a history of surgery that led to sepsis, after which she was informed that she might experience lifelong numbness in one of her toes. Occasionally, this toe bothers her, and she describes a burning sensation in the area.   She experiences soreness in her hip, which she associates with muscular inflammation. 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 She describes the soreness as being present for a couple of days following these activities.   She has a history of osteoarthritis, particularly in her hands, where she has developed Heberden's nodes. She recalls her mother also complaining about hand pain, suggesting a  possible genetic component. She is currently on a low-dose steroid, which may contribute to skin fragility.   She babysits three days a week and has been avoiding contact with children who are currently sick with the flu and COVID. No urinary trouble, extra swelling in the legs, or recent illnesses.         Previous HPI 03/06/2023 Autumn Duran is a 73 y.o. female here for follow up for retroperitoneal fibrosis complicated by low back pain and urinary obstruction currently on azathioprine  100 mg daily prednisone  5 mg daily.  She was recently hospitalized for pneumonia. She describes the experience as horrible, noting that it took two weeks to regain her strength. The patient reports no current respiratory symptoms, such as coughing or expectoration, but does mention a general body ache during the pneumonia episode.   The patient also has a history of chronic kidney disease. She reports that her kidney function numbers have been fluctuating, but her most recent GFR was in the fifties, which is close to her baseline. She is currently on Imuran  for her retroperitoneal fibrosis and has not reported any issues with this medication.   The patient denies any current pain in her back or flank, but does mention a slight soreness in the middle of her back. She also reports feeling generally achy on some days, but attributes this to her age. She has not had any other infections or needed antibiotics since  her hospitalization for pneumonia.      Previous HPI 11/21/2022 Autumn Duran is a 73 y.o. female here for follow up for retroperitoneal fibrosis complicated by low back pain and urinary obstruction currently on azathioprine  100 mg daily prednisone  2.5 mg daily.  We increased medication dose after the last visit due to markedly elevated sedimentation rate although was not having additional focal symptoms.  She had recent event falling with a right elbow fracture this occurred after trying to turn quickly  while holding items in both hands for a school function.  Also has a some associated right rib or otherwise chest wall pain.  He is having poor sleep and associated fatigue describes repeat waking throughout the night about once every hour.  Not associated with needing to urinate or pain.  She has not discussed this with her PCP office or had any sleep medicine evaluation.   Previous HPI 08/15/2022 Autumn Duran is a 73 y.o. female here for follow up for retroperitoneal fibrosis complicated by low back pain and urinary obstruction currently on azathioprine  50 mg daily prednisone  2.5 mg daily.  Since last visit she had repeat ultrasound suggestive for slight worsening of hydronephrosis.  However CT scan looked stable.  She feels significantly worse on the lower dose than she did on 5 mg of prednisone .  She took a slightly increased dose when dealing with some upper respiratory symptoms recently which was more helpful for back pain. She is not noticing any abdominal or flank pain symptoms.   Previous HPI 05/16/22 Autumn Duran is a 73 y.o. female here for follow up for retroperitoneal fibrosis on azathioprine  50 mg daily and prednisone  2.5 mg daily.  Since her last visit she did not notice any increase in abdominal or flank pain or urinary symptoms.  Has noticed some increase in joint pain and swelling at the right fifth PIP joint.  She takes ibuprofen  very occasionally which helps the symptoms takes Tylenol  sometimes it is not very helpful.  She did get sick with a sinus infection treated with a short course of Levaquin with good resolution.  Currently having congestion and drainage with nonproductive cough again related to seasonal allergies.  She is scheduled for repeat kidney ultrasound around the end of April with May follow-up in urology clinic.   Previous HPI 02/07/22 Autumn Duran is a 73 y.o. female here for follow up for retroperitoneal fibrosis on AZA 100 mg daily and prednisone  2.5 mg daily.  She is limited but generalized arthritis pain when tapering prednisone  previously. She had some intermittent right lower quadrant pain but none on most days. Recent labs in urology clinic showed normal urinalysis and kidneys appeared with chronic hydronephrosis change but stable on ultrasound appearance. She had recent labs checked with her PCP office a few days ago.   Previous HPI 11/09/21 KOREENA JOOST is a 73 y.o. female here for follow up for retroperitoneal fibrosis on azathioprine  100 mg daily and prednisone  2.5 mg daily. Since our last visit she saw found to have severe hydronephrosis bilaterally and bilateral obstructing nephrolithiasis. Stent placement and stone removal and subsequent ultrasound showed very good improvement in swelling. Her flank pains also resolved after treatment of these stones.   Previous HPI 08/02/2021 VERNIS CABACUNGAN is a 73 y.o. female here for follow up for retroperitoneal fibrosis on azathioprine  100 mg daily. She is doing well overall she had one episode of right flank and lower abdominal pain lasting about 1 week in duration.  She also started Diovan 80-12.5mg  for hypertension with improvement in headaches and blood pressure. She is not requiring and furosemide  and no recent edema. She does report some increase in urinary frequency during the day times, with occasional leaking during coughing or sneezing. No pain with urination though. She does not recall exactly when this started.     Previous HPI 05/02/2021 TEQUITA MARRS is a 73 y.o. female here for follow up for retroperitoneal fibrosis on azathioprine  100 mg daily. She discontinued prednisone  after tapering she reported some increased pain in her hand and bones. This started about 3 weeks after she stopped the 2.5 mg prednisone . She also noticed a large increase in pedal edema with stopping prednisone  that cleared up after 3 doses of lasix  prescribed from PCP office. She has some hand pain worst in the 5th fingers and  low back pain. Her left leg and knee gave her some trouble also more stiffness and weakness than pain. She resumed taking prednisone  2.5 mg about 2 weeks ago with a large improvement in symptoms again.  She had one episode of right sided abdominal pain starting to the side and moving to the center in lower abdomen then resolved. She took one percocet due to severity of pain. She never saw any frank blood or stone during this.   Previous HPI 01/31/21 HAIDYNN ALMENDAREZ is a 73 y.o. female here for follow up of retroperitoneal fibrosis with hydronephrosis on azathioprine  100 mg PO daily and prednisone  5 mg daily. She tapered prednisone  down from 15 mg to 5 mg daily since last visit without noticing specific problems. She had URI symptoms with congestion and drainage treated with amoxicillin. Weight gain with central adiposity increase she reports 16 pounds total. She notices some sensitivity with monitor bumps playing with her grandson. She is scheduled for renal ultrasound on 12/8 for monitoring by Dr. Sherrilee.   Previous HPI 10/11/20 ALEXEE DELSANTO is a 73 y.o. female here for follow up for retroperitoneal fibrosis on prednisone  15 mg daily and azathioprine  100 mg p.o. daily.  Since our last visit she became ill with COVID-pneumonia symptoms took the paxlovid treatment for this and respiratory symptoms recovered completely.  She feels overall well recovered from the problem.  The left-sided chest wall and flank pain and left arm pain from her previous fall are also improved further.  She is noticing some swelling that comes and goes in her lower extremities more than before.  Lower abdominal skin rash is improved   No Rheumatology ROS completed.   PMFS History:  Patient Active Problem List   Diagnosis Date Noted   Right lower lobe pneumonia 01/26/2023   Acute pyelonephritis 01/26/2023   Otitis externa 01/26/2023   Acute kidney injury superimposed on CKD (HCC) 01/26/2023   Dehydration 01/26/2023    Hypoalbuminemia due to protein-calorie malnutrition (HCC) 01/26/2023   Mixed hyperlipidemia 01/26/2023   CAP (community acquired pneumonia) 01/26/2023   Closed fracture of right elbow 11/21/2022   Urinary frequency 08/02/2021   Osteoarthritis of both hands 05/02/2021   COVID-19 10/11/2020   Right-sided back pain 10/11/2020   Left-sided chest wall pain 08/09/2020   High risk medication use 08/09/2020   Varicose veins of leg with swelling, bilateral 07/05/2020   Primary hypercholesterolemia 05/11/2020   Compression fracture of L1 lumbar vertebra (HCC) 05/11/2020   Bilateral nephrolithiasis 05/11/2020   Bilateral hydronephrosis 04/14/2020   Retroperitoneal fibrosis 03/09/2020   Degenerative scoliosis 03/05/2020   Degenerative spondylolisthesis 03/05/2020   Opioid dependence (HCC)  09/18/2019   Elevated blood-pressure reading, without diagnosis of hypertension 01/08/2019   Closed fracture of proximal end of right humerus 06/26/2018   Paraesophageal hernia 12/12/2017   Osteoporosis 12/10/2017   Dysphagia 06/26/2017   Gastroesophageal reflux disease 06/26/2017   Hx of colonic polyps 06/26/2017   Nausea with vomiting 10/12/2014   Acute low back pain 08/13/2014   Epidural abscess 08/13/2014   Hypertension    Chronic back pain    Essential hypertension    Other iron  deficiency anemia 04/10/2014   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/2014   Right hip pain 12/23/2013   Epidural abscess, L2-L5 11/04/2013   Hypokalemia 08/20/2013   Lumbar compression fracture (HCC) 07/12/2013   Compression fracture 07/12/2013   Benign neoplasm of colon 12/18/2011   Esophageal reflux 12/18/2011   Other dysphagia 12/18/2011    Past Medical History:  Diagnosis Date   Bronchitis, chronic (HCC)    Cancer (HCC)    Chronic back pain    Chronic kidney disease    kidney stone s/p stent placement ( removed)   Complication of anesthesia    Compression fracture of L3 lumbar vertebra    Diverticulosis    Elbow  fracture 10/2022   GERD (gastroesophageal reflux disease)    Hemorrhoids    Hiatal hernia    History of kidney stones    Hx of adenomatous colonic polyps    Hyperlipidemia    Hypertension    Lumbar radicular pain    Osteoporosis 12/10/2017   PONV (postoperative nausea and vomiting)    Sore throat    T7 vertebral fracture (HCC)     Family History  Problem Relation Age of Onset   Breast cancer Sister 30   Breast cancer Other 75       aunt   Hypertension Mother    COPD Mother    COPD Father    Hypertension Sister    Aortic aneurysm Sister    Hypertension Son    Past Surgical History:  Procedure Laterality Date   BACK SURGERY     BONE MARROW ASPIRATION Left 02/2014   BONE MARROW BIOPSY Left 02/2014   CHOLECYSTECTOMY     COLONOSCOPY  12/18/2011   Procedure: COLONOSCOPY;  Surgeon: Princella CHRISTELLA Nida, MD;  Location: [facility];  Service: Endoscopy;  Laterality: N/A;   COLONOSCOPY WITH PROPOFOL  N/A 08/03/2017   Procedure: COLONOSCOPY WITH PROPOFOL ;  Surgeon: Golda Claudis PENNER, MD;  Location: [facility];  Service: Endoscopy;  Laterality: N/A;   CYSTOSCOPY W/ URETERAL STENT PLACEMENT Bilateral 08/18/2021   Procedure: CYSTOSCOPY WITH  BILATERAL RETROGRADE PYELOGRAM BILATERAL SAMUEL STENT PLACEMENT;  Surgeon: Sherrilee Belvie CROME, MD;  Location: [facility];  Service: Urology;  Laterality: Bilateral;   CYSTOSCOPY WITH RETROGRADE PYELOGRAM, URETEROSCOPY AND STENT PLACEMENT Bilateral 09/01/2021   Procedure: CYSTOSCOPY WITH  bilateral RETROGRADE PYELOGRAM, URETEROSCOPY AND  bilateral STENT exchange;  Surgeon: Sherrilee Belvie CROME, MD;  Location: [facility];  Service: Urology;  Laterality: Bilateral;   CYSTOSCOPY/RETROGRADE/URETEROSCOPY/STONE EXTRACTION WITH BASKET Bilateral 04/18/2018   Procedure: CYSTOSCOPY/BILATERAL RETROGRADE/URETEROSCOPY/STONE EXTRACTION WITH BASKET WITH RIGHT STENT PLACEMENT;  Surgeon: Sherrilee Belvie CROME, MD;  Location: [facility];  Service: Urology;  Laterality: Bilateral;  1 HR    ESOPHAGEAL DILATION N/A 08/03/2017   Procedure: ESOPHAGEAL DILATION;  Surgeon: Golda Claudis PENNER, MD;  Location: [facility];  Service: Endoscopy;  Laterality: N/A;   ESOPHAGOGASTRODUODENOSCOPY  12/18/2011   Procedure: ESOPHAGOGASTRODUODENOSCOPY (EGD);  Surgeon: Princella CHRISTELLA Nida, MD;  Location: [facility];  Service: Endoscopy;  Laterality: N/A;  ESOPHAGOGASTRODUODENOSCOPY (EGD) WITH PROPOFOL  N/A 08/03/2017   Procedure: ESOPHAGOGASTRODUODENOSCOPY (EGD) WITH PROPOFOL ;  Surgeon: Golda Claudis PENNER, MD;  Location: [facility];  Service: Endoscopy;  Laterality: N/A;  pt knows to arrive at 6:15   HERNIA REPAIR     HOLMIUM LASER APPLICATION Right 04/18/2018   Procedure: HOLMIUM LASER APPLICATION;  Surgeon: Sherrilee Belvie CROME, MD;  Location: [facility];  Service: Urology;  Laterality: Right;   HOLMIUM LASER APPLICATION Bilateral 09/01/2021   Procedure: HOLMIUM LASER APPLICATION;  Surgeon: Sherrilee Belvie CROME, MD;  Location: [facility];  Service: Urology;  Laterality: Bilateral;   LUMBAR LAMINECTOMY FOR EPIDURAL ABSCESS Left 09/22/2013   Procedure: LUMBAR LAMINECTOMY FOR EPIDURAL ABSCESS LEFT LUMBAR FIVE-SACRAL ONE;  Surgeon: Victory DELENA Gunnels, MD;  Location: [facility];  Service: Neurosurgery;  Laterality: Left;   RADIOLOGY WITH ANESTHESIA N/A 08/13/2014   Procedure: RADIOLOGY WITH ANESTHESIA ;  Surgeon: Medication Radiologist, MD;  Location: [facility];  Service: Radiology;  Laterality: N/A;   URETERAL STENT PLACEMENT     VERTEBROPLASTY N/A 07/14/2013   Procedure: VERTEBROPLASTY WITH LUMBAR THREE BIOPSY;  Surgeon: Victory DELENA Gunnels, MD;  Location: [facility];  Service: Neurosurgery;  Laterality: N/A;  VERTEBROPLASTY WITH LUMBAR THREE BIOPSY   Social History   Social History Narrative   Not on file   There is no immunization history for the selected administration types on file for this patient.   Objective: Vital Signs: There were no vitals taken for this visit.   Physical Exam   Musculoskeletal Exam:  ***  CDAI Exam: CDAI Score: -- Patient Global: --; Provider Global: -- Swollen: --; Tender: -- Joint Exam 09/04/2023   No joint exam has been documented for this visit   There is currently no information documented on the homunculus. Go to the Rheumatology activity and complete the homunculus joint exam.  Investigation: No additional findings.  Imaging: US  RENAL Result Date: 08/10/2023 CLINICAL DATA:  Nephrolithiasis. EXAM: RENAL / URINARY TRACT ULTRASOUND COMPLETE COMPARISON:  CT 01/26/2023. Ultrasound 07/04/2022. Multiple other prior examinations as well. FINDINGS: Right Kidney: Renal measurements: 11.4 x 5.8 x 5.8 cm = volume: 206.6 mL. Mild renal parenchymal atrophy with moderate collecting system dilatation, similar to previous CT scan. No perinephric fluid. Lower pole stones suggested measuring up to 5 mm. This was seen on the prior CT. Left Kidney: Renal measurements: 10.4 x 4.5 x 5.7 cm = volume: 140.5 mL. Moderate parenchymal atrophy. Severe collecting system dilatation again seen on the prior examination. No perinephric fluid. Possible shadowing stone measured 8 mm by the sonographer but this does not clearly shadow and on prior CT scan there were only punctate stones identified in the kidney. Bladder: Underdistended urinary bladder. Other: None. IMPRESSION: Bilateral renal atrophy with severe left and moderate right renal collecting system dilatation similar to the prior CT scan. Small stone seen bilaterally. These are not as well defined on today's ultrasound as on the prior CT scan. Electronically Signed   By: Ranell Bring M.D.   On: 08/10/2023 11:03    Recent Labs: Lab Results  Component Value Date   WBC 6.5 06/04/2023   HGB 13.9 06/04/2023   PLT 281 06/04/2023   NA 142 06/04/2023   K 4.0 06/04/2023   CL 103 06/04/2023   CO2 28 06/04/2023   GLUCOSE 89 06/04/2023   BUN 23 06/04/2023   CREATININE 1.13 (H) 06/04/2023   BILITOT 0.3 06/04/2023   ALKPHOS 54 01/27/2023   AST  18 06/04/2023   ALT  6 06/04/2023   PROT 6.9 06/04/2023   ALBUMIN 2.6 (L) 01/27/2023   CALCIUM  10.5 (H) 06/04/2023   GFRAA 67 08/09/2020    Speciality Comments: No specialty comments available.  Procedures:  No procedures performed Allergies: Chlorhexidine  gluconate, Morphine and codeine, and Tetracycline   Assessment / Plan:     Visit Diagnoses: No diagnosis found.  ***  Orders: No orders of the defined types were placed in this encounter.  No orders of the defined types were placed in this encounter.    Follow-Up Instructions: No follow-ups on file.   Shelba SHAUNNA Potters, RT  Note - This record has been created using [employer].  Chart creation errors have been sought, but may not always  have been located. Such creation errors do not reflect on  the standard of medical care.

## 2023-08-28 DIAGNOSIS — E041 Nontoxic single thyroid nodule: Secondary | ICD-10-CM | POA: Diagnosis not present

## 2023-08-28 DIAGNOSIS — J479 Bronchiectasis, uncomplicated: Secondary | ICD-10-CM | POA: Diagnosis not present

## 2023-08-28 DIAGNOSIS — R918 Other nonspecific abnormal finding of lung field: Secondary | ICD-10-CM | POA: Diagnosis not present

## 2023-09-04 ENCOUNTER — Other Ambulatory Visit (HOSPITAL_COMMUNITY): Payer: Self-pay | Admitting: Internal Medicine

## 2023-09-04 ENCOUNTER — Ambulatory Visit: Admitting: Internal Medicine

## 2023-09-04 DIAGNOSIS — K682 Retroperitoneal fibrosis: Secondary | ICD-10-CM

## 2023-09-04 DIAGNOSIS — Z79899 Other long term (current) drug therapy: Secondary | ICD-10-CM

## 2023-09-04 DIAGNOSIS — E041 Nontoxic single thyroid nodule: Secondary | ICD-10-CM

## 2023-09-04 DIAGNOSIS — N281 Cyst of kidney, acquired: Secondary | ICD-10-CM

## 2023-09-04 DIAGNOSIS — Z7952 Long term (current) use of systemic steroids: Secondary | ICD-10-CM

## 2023-09-04 DIAGNOSIS — G8929 Other chronic pain: Secondary | ICD-10-CM

## 2023-09-13 ENCOUNTER — Encounter (HOSPITAL_COMMUNITY): Payer: Self-pay | Admitting: Hematology

## 2023-09-15 NOTE — Progress Notes (Unsigned)
 Autumn Duran, female    DOB: 28-Feb-1950    MRN: [id number]   Brief patient profile:  7  yowf  never smoker grew up with passive smoking which continued until 2004 when husband passed  referred to pulmonary clinic in [place]  09/17/2023 by Dr Rosamond  for cough with abn CT chest 08/28/23  On imuran  x 2023 for renal dz    1.   Bronchiectasis within the left lower lobe with extensive mucous plugging, bronchial wall thickening, and surrounding groundglass suspicious for infectious/inflammatory process, likely bronchiolitis/pneumonitis related to aspiration.  3.    Mild bronchiectasis within the inferior lingula, inferior right lower lobe and right middle lobe to a much lesser extent with areas of mucus plugging.  4.    Few pulmonary micronodules. Per Fleischner criteria, if patient is at high risk for pulmonary malignancy, consider optional follow-up CT chest in 12 months.   Autumn Duran  in 2017  Autumn Duran w/u most mold on skin testing ? Around 2023    Pt not previously seen by PCCM service.     History of Present Illness  09/17/2023  Pulmonary/ 1st office eval/ Autumn Duran / [name]  Chief Complaint  Patient presents with   Establish Care  Dyspnea:  Not limited by breathing from desired activities   Cough: clear mucus / worse in ams x 30 min to resolve each day and then min sporadic thru the day  Sleep: flat bed 2 pillows prefers to lie on L side down  SABA use: none - doesn't use the one she has  02: none       No obvious day to day or daytime pattern/variability or assoc   purulent sputum or mucus plugs or hemoptysis or cp or chest tightness, subjective wheeze or overt  hb symptoms.    Also denies any obvious fluctuation of symptoms with weather or environmental changes or other aggravating or alleviating factors except as outlined above   No unusual exposure hx or h/o childhood pna/ asthma or knowledge of premature birth.  Current Allergies, Complete Past Medical History, Past  Surgical History, Family History, and Social History were reviewed in [employer] record.  ROS  The following are not active complaints unless bolded Hoarseness, sore throat, dysphagia, dental problems, itching, sneezing,  nasal congestion or discharge of excess mucus or purulent secretions, ear ache,   fever, chills, sweats, unintended wt loss or wt gain, classically pleuritic or exertional cp,  orthopnea pnd or arm/hand swelling  or leg swelling, presyncope, palpitations, abdominal pain, anorexia, nausea, vomiting, diarrhea  or change in bowel habits or change in bladder habits, change in stools or change in urine, dysuria, hematuria,  rash, arthralgias, visual complaints, headache, numbness, weakness or ataxia or problems with walking or coordination,  change in mood or  memory.            Outpatient Medications Prior to Visit  Medication Sig Dispense Refill   acetaminophen  (TYLENOL ) 325 MG tablet Take 2 tablets (650 mg total) by mouth every 6 (six) hours as needed for mild pain (pain score 1-3) (or Fever >/= 101).     azaTHIOprine  (IMURAN ) 50 MG tablet Take 2 tablets (100 mg total) by mouth daily. 180 tablet 0   HYDROcodone -acetaminophen  (NORCO/VICODIN) 5-325 MG tablet Take 1 tablet by mouth every 6 (six) hours as needed (severe pain.).     omeprazole  (PRILOSEC) 20 MG capsule Take 20 mg by mouth daily before breakfast.     oxyCODONE  (OXYCONTIN ) 20  mg 12 hr tablet Take 20 mg by mouth in the morning.     predniSONE  (DELTASONE ) 5 MG tablet TAKE 1/2 TABLET BY MOUTH DAILY WITH BREAKFAST 45 tablet 0   valsartan-hydrochlorothiazide (DIOVAN-HCT) 80-12.5 MG tablet Take 1 tablet by mouth daily.     Methylnaltrexone Bromide (RELISTOR) 150 MG TABS TAKE 3 TABLETS BY MOUTH EVERY DAY IN THE MORNING (Patient not taking: Reported on 09/17/2023)     rosuvastatin  (CRESTOR ) 5 MG tablet Take 5 mg by mouth once a week. (Patient not taking: Reported on 09/17/2023)     Vitamin D , Ergocalciferol ,  (DRISDOL ) 1.25 MG (50000 UNIT) CAPS capsule TAKE 1 CAPSULE BY MOUTH ONCE A WEEK (Patient not taking: Reported on 09/17/2023) 16 capsule 3   No facility-administered medications prior to visit.    Past Medical History:  Diagnosis Date   Bronchitis, chronic (HCC)    Cancer (HCC)    Chronic back pain    Chronic kidney disease    kidney stone s/p stent placement ( removed)   Complication of anesthesia    Compression fracture of L3 lumbar vertebra    Diverticulosis    Elbow fracture 10/2022   GERD (gastroesophageal reflux disease)    Hemorrhoids    Hiatal hernia    History of kidney stones    Hx of adenomatous colonic polyps    Hyperlipidemia    Hypertension    Lumbar radicular pain    Osteoporosis 12/10/2017   PONV (postoperative nausea and vomiting)    Sore throat    T7 vertebral fracture (HCC)       Objective:     BP (!) 96/45   Pulse (!) 52   Ht 5' (1.524 m)   Wt 158 lb 3.2 oz (71.8 kg)   SpO2 98% Comment: ra  BMI 30.90 kg/m   SpO2: 98 % (ra)  Pleasant amb elderly wf with congested souding cough   HEENT : Oropharynx  clear      Nasal turbinates moderate swelling s excess secretions    NECK :  without  apparent JVD/ palpable Nodes/TM    LUNGS: no acc muscle use,   Mod kyphotic contour chest with scatterd insp/ exp rhonchi  bilaterally without cough on insp or exp maneuvers   CV:  RRR  no s3 or murmur or increase in P2, and no edema   ABD:  soft and nontender   MS:  Gait nl   ext warm without deformities Or obvious joint restrictions  calf tenderness, cyanosis or clubbing    SKIN: warm and dry without lesions    NEURO:  alert, approp, nl sensorium with  no motor or cerebellar deficits apparent.   CXR PA and Lateral:   09/17/2023 :    I personally reviewed images and impression is as follows:     Mod kyphosis/ no acute findings     Assessment   Bronchiectasis without complication (HCC) Onset ? In childhood with heavy cigarette smoke exp assoc with  chronic sinusitis (Autumn Duran) - flutter valve training 09/17/2023 >>>  - Allergy screen 09/17/2023 >  Eos 0. /  IgE  - Quant Igs 09/17/2023  - Quant Gold TB 09/17/2023     Probably has assoc chronic sinusitis and will need to re-establish ENT care at some future point but for now will keep it simple with max mucolytics/ vest and prn ceftin for flares and f/u with full pfts / max dose prevnar and RSV vaccination.   F/u with full pfts w/in 3 m,  call sooner if needed           Each maintenance medication was reviewed in detail including emphasizing most importantly the difference between maintenance and prns and under what circumstances the prns are to be triggered using an action plan format where appropriate.  Total time for H and P, chart review, counseling, reviewing flutter  device(s) and generating customized AVS unique to this office visit / same day charting = 48 min new pt eval with complex chronic respiratory problems        Ozell America, MD 09/17/2023

## 2023-09-17 ENCOUNTER — Telehealth: Payer: Self-pay | Admitting: Internal Medicine

## 2023-09-17 ENCOUNTER — Encounter: Payer: Self-pay | Admitting: Internal Medicine

## 2023-09-17 ENCOUNTER — Ambulatory Visit: Payer: Self-pay | Admitting: Internal Medicine

## 2023-09-17 ENCOUNTER — Ambulatory Visit: Admitting: Internal Medicine

## 2023-09-17 ENCOUNTER — Ambulatory Visit (HOSPITAL_COMMUNITY)
Admission: RE | Admit: 2023-09-17 | Discharge: 2023-09-17 | Disposition: A | Discharge status: Home / Self Care | Source: Ambulatory Visit | Attending: Internal Medicine | Admitting: Internal Medicine

## 2023-09-17 VITALS — BP 96/45 | HR 52 | Ht 60.0 in | Wt 158.2 lb

## 2023-09-17 DIAGNOSIS — J479 Bronchiectasis, uncomplicated: Secondary | ICD-10-CM | POA: Insufficient documentation

## 2023-09-17 DIAGNOSIS — E041 Nontoxic single thyroid nodule: Secondary | ICD-10-CM | POA: Insufficient documentation

## 2023-09-17 DIAGNOSIS — N281 Cyst of kidney, acquired: Secondary | ICD-10-CM | POA: Diagnosis not present

## 2023-09-17 DIAGNOSIS — J40 Bronchitis, not specified as acute or chronic: Secondary | ICD-10-CM | POA: Diagnosis not present

## 2023-09-17 DIAGNOSIS — R918 Other nonspecific abnormal finding of lung field: Secondary | ICD-10-CM | POA: Diagnosis not present

## 2023-09-17 DIAGNOSIS — N1330 Unspecified hydronephrosis: Secondary | ICD-10-CM | POA: Diagnosis not present

## 2023-09-17 DIAGNOSIS — E042 Nontoxic multinodular goiter: Secondary | ICD-10-CM | POA: Diagnosis not present

## 2023-09-17 NOTE — Telephone Encounter (Signed)
 LVM for patient regarding the 10/30/23 11:00 am PFT appointment  at [facility] time is 10:45 am--1st floor registration desk for check in---will mail information to patient and requested call back with questions or concerns

## 2023-09-17 NOTE — Patient Instructions (Addendum)
 Bronchiectasis =   you have scarring of your bronchial tubes which means that they don't function perfectly normally and mucus tends to pool in certain areas of your lung which can cause pneumonia and further scarring of your lung and bronchial tubes  Whenever you develop cough congestion take mucinex  or mucinex  dm > these will help keep the mucus loose and flowing but if your condition worsens you need to seek help immediately preferably here or somewhere inside the [facility] system to compare xrays ( worse = darker or bloody mucus or pain on breathing in)    We need to minimize your immunosuppressives to extent possible    For cough/ congestion > mucinex  or mucinex  dm  up to maximum of  1200 mg every 12 hours and use the flutter valve as much as you can    I strongly recommend you take the Prevnar 20 and RSV  thru your PCP   Please remember to go to the lab department   for your tests - we will call you with the results when they are available.      Please remember to go to the  x-ray department  @  [facility] for your tests - we will call you with the results when they are available      Please schedule a follow up office visit in 6 weeks, call sooner if needed with PFTs and bring your inhaler

## 2023-09-17 NOTE — Assessment & Plan Note (Addendum)
 Onset ? In childhood with heavy cigarette smoke exp assoc with chronic sinusitis ([place]) - flutter valve training 09/17/2023 >>>  - Allergy screen 09/17/2023 >  Eos 0. /  IgE  - Quant Igs 09/17/2023  - Quant Gold TB 09/17/2023     Probably has assoc chronic sinusitis and will need to re-establish ENT care at some future point but for now will keep it simple with max mucolytics/ vest and prn ceftin for flares and f/u with full pfts / max dose prevnar and RSV vaccination.   F/u with full pfts w/in 3 m, call sooner if needed           Each maintenance medication was reviewed in detail including emphasizing most importantly the difference between maintenance and prns and under what circumstances the prns are to be triggered using an action plan format where appropriate.  Total time for H and P, chart review, counseling, reviewing flutter  device(s) and generating customized AVS unique to this office visit / same day charting = 48 min new pt eval with complex chronic respiratory problems

## 2023-09-18 NOTE — Progress Notes (Signed)
 Office Visit Note  Patient: Autumn Duran             Date of Birth: 19-Jan-1951           MRN: [id number]             PCP: Rosamond Leta NOVAK, MD Referring: Rosamond Leta NOVAK, MD Visit Date: 09/25/2023   Subjective:  Follow-up   Discussed the use of AI scribe software for clinical note transcription with the patient, who gave verbal consent to proceed.  History of Present Illness   Autumn Duran is a 73 y.o. female here for follow up for retroperitoneal fibrosis complicated by low back pain and urinary obstruction currently on azathioprine  100 mg daily prednisone  2.5 mg daily.    Recent blood tests showed normal inflammatory markers, and her kidney ultrasound findings have not changed.  She has a history of bronchiectasis, and recent evaluations at the pulmonary clinic, including blood tests, were negative for active disease. The focus remains on minimizing immunosuppression to prevent exacerbations.  She experiences chronic low back pain due to osteoarthritis in the pelvis joints and acquired scoliosis in the spine. She previously underwent kyphoplasty at the L3 vertebral body due to a collapse, which involved cement injection. Her lumbar spine shows extensive degenerative disease.  Increased kyphosis in the thoracic spine, commonly referred to as a 'dowager's hump', limits her thoracic spine mobility and contributes to posture issues. She attempts to maintain a straight posture but finds it challenging due to structural changes in her spine.       Previous HPI 06/04/2023 Autumn Duran is a 73 y.o. female here for follow up for retroperitoneal fibrosis complicated by low back pain and urinary obstruction currently on azathioprine  100 mg daily prednisone  2.5 mg daily.    She experiences swelling and pain in the right pinky, which she attributes to arthritis. The symptoms worsen with certain dietary choices, particularly when consuming a lot of meat. The duration of these symptoms has  been ongoing for a while, although she does not specify an exact timeframe.   She reports pain and burning sensations in the toes of her left foot. These symptoms have been present for a while, but no specific duration is provided. She mentions a history of surgery that led to sepsis, after which she was informed that she might experience lifelong numbness in one of her toes. Occasionally, this toe bothers her, and she describes a burning sensation in the area.   She experiences soreness in her hip, which she associates with muscular inflammation. 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 She describes the soreness as being present for a couple of days following these activities.   She has a history of osteoarthritis, particularly in her hands, where she has developed Heberden's nodes. She recalls her mother also complaining about hand pain, suggesting a possible genetic component. She is currently on a low-dose steroid, which may contribute to skin fragility.   She babysits three days a week and has been avoiding contact with children who are currently sick with the flu and COVID. No urinary trouble, extra swelling in the legs, or recent illnesses.         Previous HPI 03/06/2023 Autumn Duran is a 73 y.o. female here for follow up for retroperitoneal fibrosis complicated by low back pain and urinary obstruction currently on azathioprine  100 mg  daily prednisone  5 mg daily.  She was recently hospitalized for pneumonia. She describes the experience as horrible, noting that it took two weeks to regain her strength. The patient reports no current respiratory symptoms, such as coughing or expectoration, but does mention a general body ache during the pneumonia episode.   The patient also has a history of chronic kidney disease. She reports that her kidney function  numbers have been fluctuating, but her most recent GFR was in the fifties, which is close to her baseline. She is currently on Imuran  for her retroperitoneal fibrosis and has not reported any issues with this medication.   The patient denies any current pain in her back or flank, but does mention a slight soreness in the middle of her back. She also reports feeling generally achy on some days, but attributes this to her age. She has not had any other infections or needed antibiotics since her hospitalization for pneumonia.      Previous HPI 11/21/2022 Autumn Duran is a 73 y.o. female here for follow up for retroperitoneal fibrosis complicated by low back pain and urinary obstruction currently on azathioprine  100 mg daily prednisone  2.5 mg daily.  We increased medication dose after the last visit due to markedly elevated sedimentation rate although was not having additional focal symptoms.  She had recent event falling with a right elbow fracture this occurred after trying to turn quickly while holding items in both hands for a school function.  Also has a some associated right rib or otherwise chest wall pain.  He is having poor sleep and associated fatigue describes repeat waking throughout the night about once every hour.  Not associated with needing to urinate or pain.  She has not discussed this with her PCP office or had any sleep medicine evaluation.   Previous HPI 08/15/2022 Autumn Duran is a 73 y.o. female here for follow up for retroperitoneal fibrosis complicated by low back pain and urinary obstruction currently on azathioprine  50 mg daily prednisone  2.5 mg daily.  Since last visit she had repeat ultrasound suggestive for slight worsening of hydronephrosis.  However CT scan looked stable.  She feels significantly worse on the lower dose than she did on 5 mg of prednisone .  She took a slightly increased dose when dealing with some upper respiratory symptoms recently which was more helpful for  back pain. She is not noticing any abdominal or flank pain symptoms.   Previous HPI 05/16/22 Autumn Duran is a 73 y.o. female here for follow up for retroperitoneal fibrosis on azathioprine  50 mg daily and prednisone  2.5 mg daily.  Since her last visit she did not notice any increase in abdominal or flank pain or urinary symptoms.  Has noticed some increase in joint pain and swelling at the right fifth PIP joint.  She takes ibuprofen  very occasionally which helps the symptoms takes Tylenol  sometimes it is not very helpful.  She did get sick with a sinus infection treated with a short course of Levaquin with good resolution.  Currently having congestion and drainage with nonproductive cough again related to seasonal allergies.  She is scheduled for repeat kidney ultrasound around the end of April with May follow-up in urology clinic.   Previous HPI 02/07/22 Autumn Duran is a 73 y.o. female here for follow up for retroperitoneal fibrosis on AZA 100 mg daily and prednisone  2.5 mg daily. She is limited but generalized arthritis pain when tapering prednisone  previously. She had some intermittent right lower  quadrant pain but none on most days. Recent labs in urology clinic showed normal urinalysis and kidneys appeared with chronic hydronephrosis change but stable on ultrasound appearance. She had recent labs checked with her PCP office a few days ago.   Previous HPI 11/09/21 Autumn Duran is a 73 y.o. female here for follow up for retroperitoneal fibrosis on azathioprine  100 mg daily and prednisone  2.5 mg daily. Since our last visit she saw found to have severe hydronephrosis bilaterally and bilateral obstructing nephrolithiasis. Stent placement and stone removal and subsequent ultrasound showed very good improvement in swelling. Her flank pains also resolved after treatment of these stones.   Previous HPI 08/02/2021 Autumn Duran is a 73 y.o. female here for follow up for retroperitoneal fibrosis on  azathioprine  100 mg daily. She is doing well overall she had one episode of right flank and lower abdominal pain lasting about 1 week in duration. She also started Diovan 80-12.5mg  for hypertension with improvement in headaches and blood pressure. She is not requiring and furosemide  and no recent edema. She does report some increase in urinary frequency during the day times, with occasional leaking during coughing or sneezing. No pain with urination though. She does not recall exactly when this started.     Previous HPI 05/02/2021 Autumn Duran is a 73 y.o. female here for follow up for retroperitoneal fibrosis on azathioprine  100 mg daily. She discontinued prednisone  after tapering she reported some increased pain in her hand and bones. This started about 3 weeks after she stopped the 2.5 mg prednisone . She also noticed a large increase in pedal edema with stopping prednisone  that cleared up after 3 doses of lasix  prescribed from PCP office. She has some hand pain worst in the 5th fingers and low back pain. Her left leg and knee gave her some trouble also more stiffness and weakness than pain. She resumed taking prednisone  2.5 mg about 2 weeks ago with a large improvement in symptoms again.  She had one episode of right sided abdominal pain starting to the side and moving to the center in lower abdomen then resolved. She took one percocet due to severity of pain. She never saw any frank blood or stone during this.   Previous HPI 01/31/21 Autumn Duran is a 73 y.o. female here for follow up of retroperitoneal fibrosis with hydronephrosis on azathioprine  100 mg PO daily and prednisone  5 mg daily. She tapered prednisone  down from 15 mg to 5 mg daily since last visit without noticing specific problems. She had URI symptoms with congestion and drainage treated with amoxicillin. Weight gain with central adiposity increase she reports 16 pounds total. She notices some sensitivity with monitor bumps playing with  her grandson. She is scheduled for renal ultrasound on 12/8 for monitoring by Dr. Sherrilee.   Previous HPI 10/11/20 Autumn Duran is a 73 y.o. female here for follow up for retroperitoneal fibrosis on prednisone  15 mg daily and azathioprine  100 mg p.o. daily.  Since our last visit she became ill with COVID-pneumonia symptoms took the paxlovid treatment for this and respiratory symptoms recovered completely.  She feels overall well recovered from the problem.  The left-sided chest wall and flank pain and left arm pain from her previous fall are also improved further.  She is noticing some swelling that comes and goes in her lower extremities more than before.  Lower abdominal skin rash is improved   Review of Systems  Constitutional:  Positive for fatigue.  HENT:  Negative for mouth sores and mouth dryness.   Eyes:  Negative for dryness.  Respiratory:  Negative for shortness of breath.   Cardiovascular:  Negative for chest pain and palpitations.  Gastrointestinal:  Positive for constipation. Negative for blood in stool and diarrhea.  Endocrine: Negative for increased urination.  Genitourinary:  Negative for involuntary urination.  Musculoskeletal:  Positive for myalgias, morning stiffness and myalgias. Negative for joint pain, gait problem, joint pain, joint swelling, muscle weakness and muscle tenderness.  Skin:  Negative for color change, rash, hair loss and sensitivity to sunlight.  Allergic/Immunologic: Positive for susceptible to infections.  Neurological:  Negative for dizziness and headaches.  Hematological:  Negative for swollen glands.  Psychiatric/Behavioral:  Positive for sleep disturbance. Negative for depressed mood. The patient is not nervous/anxious.     PMFS History:  Patient Active Problem List   Diagnosis Date Noted   Bronchiectasis without complication (HCC) 09/17/2023   Right lower lobe pneumonia 01/26/2023   Acute pyelonephritis 01/26/2023   Otitis externa 01/26/2023    Acute kidney injury superimposed on CKD (HCC) 01/26/2023   Dehydration 01/26/2023   Hypoalbuminemia due to protein-calorie malnutrition (HCC) 01/26/2023   Mixed hyperlipidemia 01/26/2023   CAP (community acquired pneumonia) 01/26/2023   Closed fracture of right elbow 11/21/2022   Urinary frequency 08/02/2021   Osteoarthritis of both hands 05/02/2021   COVID-19 10/11/2020   Right-sided back pain 10/11/2020   Left-sided chest wall pain 08/09/2020   High risk medication use 08/09/2020   Varicose veins of leg with swelling, bilateral 07/05/2020   Primary hypercholesterolemia 05/11/2020   Compression fracture of L1 lumbar vertebra (HCC) 05/11/2020   Bilateral nephrolithiasis 05/11/2020   Bilateral hydronephrosis 04/14/2020   Retroperitoneal fibrosis 03/09/2020   Degenerative scoliosis 03/05/2020   Degenerative spondylolisthesis 03/05/2020   Opioid dependence (HCC) 09/18/2019   Elevated blood-pressure reading, without diagnosis of hypertension 01/08/2019   Closed fracture of proximal end of right humerus 06/26/2018   Paraesophageal hernia 12/12/2017   Osteoporosis 12/10/2017   Dysphagia 06/26/2017   Gastroesophageal reflux disease 06/26/2017   Hx of colonic polyps 06/26/2017   Nausea with vomiting 10/12/2014   Acute low back pain 08/13/2014   Epidural abscess 08/13/2014   Hypertension    Chronic back pain    Essential hypertension    Other iron  deficiency anemia 04/10/2014   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/2014   Right hip pain 12/23/2013   Epidural abscess, L2-L5 11/04/2013   Hypokalemia 08/20/2013   Lumbar compression fracture (HCC) 07/12/2013   Compression fracture 07/12/2013   Benign neoplasm of colon 12/18/2011   Esophageal reflux 12/18/2011   Other dysphagia 12/18/2011    Past Medical History:  Diagnosis Date   Bronchitis, chronic (HCC)    Cancer (HCC)    Chronic back pain    Chronic kidney disease    kidney stone s/p stent placement ( removed)   Complication  of anesthesia    Compression fracture of L3 lumbar vertebra    Diverticulosis    Elbow fracture 10/2022   GERD (gastroesophageal reflux disease)    Hemorrhoids    Hiatal hernia    History of kidney stones    Hx of adenomatous colonic polyps    Hyperlipidemia    Hypertension    Lumbar radicular pain    Osteoporosis 12/10/2017   PONV (postoperative nausea and vomiting)    Sore throat    T7 vertebral fracture (HCC)     Family History  Problem Relation Age of Onset   Breast cancer  Sister 24   Breast cancer Other 41       aunt   Hypertension Mother    COPD Mother    COPD Father    Hypertension Sister    Aortic aneurysm Sister    Hypertension Son    Past Surgical History:  Procedure Laterality Date   BACK SURGERY     BONE MARROW ASPIRATION Left 02/2014   BONE MARROW BIOPSY Left 02/2014   CHOLECYSTECTOMY     COLONOSCOPY  12/18/2011   Procedure: COLONOSCOPY;  Surgeon: Princella CHRISTELLA Nida, MD;  Location: [facility];  Service: Endoscopy;  Laterality: N/A;   COLONOSCOPY WITH PROPOFOL  N/A 08/03/2017   Procedure: COLONOSCOPY WITH PROPOFOL ;  Surgeon: Golda Claudis PENNER, MD;  Location: [facility];  Service: Endoscopy;  Laterality: N/A;   CYSTOSCOPY W/ URETERAL STENT PLACEMENT Bilateral 08/18/2021   Procedure: CYSTOSCOPY WITH  BILATERAL RETROGRADE PYELOGRAM BILATERAL SAMUEL STENT PLACEMENT;  Surgeon: Sherrilee Belvie CROME, MD;  Location: [facility];  Service: Urology;  Laterality: Bilateral;   CYSTOSCOPY WITH RETROGRADE PYELOGRAM, URETEROSCOPY AND STENT PLACEMENT Bilateral 09/01/2021   Procedure: CYSTOSCOPY WITH  bilateral RETROGRADE PYELOGRAM, URETEROSCOPY AND  bilateral STENT exchange;  Surgeon: Sherrilee Belvie CROME, MD;  Location: [facility];  Service: Urology;  Laterality: Bilateral;   CYSTOSCOPY/RETROGRADE/URETEROSCOPY/STONE EXTRACTION WITH BASKET Bilateral 04/18/2018   Procedure: CYSTOSCOPY/BILATERAL RETROGRADE/URETEROSCOPY/STONE EXTRACTION WITH BASKET WITH RIGHT STENT PLACEMENT;  Surgeon: Sherrilee Belvie CROME, MD;  Location: [facility];  Service: Urology;  Laterality: Bilateral;  1 HR   ESOPHAGEAL DILATION N/A 08/03/2017   Procedure: ESOPHAGEAL DILATION;  Surgeon: Golda Claudis PENNER, MD;  Location: [facility];  Service: Endoscopy;  Laterality: N/A;   ESOPHAGOGASTRODUODENOSCOPY  12/18/2011   Procedure: ESOPHAGOGASTRODUODENOSCOPY (EGD);  Surgeon: Princella CHRISTELLA Nida, MD;  Location: [facility];  Service: Endoscopy;  Laterality: N/A;   ESOPHAGOGASTRODUODENOSCOPY (EGD) WITH PROPOFOL  N/A 08/03/2017   Procedure: ESOPHAGOGASTRODUODENOSCOPY (EGD) WITH PROPOFOL ;  Surgeon: Golda Claudis PENNER, MD;  Location: [facility];  Service: Endoscopy;  Laterality: N/A;  pt knows to arrive at 6:15   HERNIA REPAIR     HOLMIUM LASER APPLICATION Right 04/18/2018   Procedure: HOLMIUM LASER APPLICATION;  Surgeon: Sherrilee Belvie CROME, MD;  Location: [facility];  Service: Urology;  Laterality: Right;   HOLMIUM LASER APPLICATION Bilateral 09/01/2021   Procedure: HOLMIUM LASER APPLICATION;  Surgeon: Sherrilee Belvie CROME, MD;  Location: [facility];  Service: Urology;  Laterality: Bilateral;   LUMBAR LAMINECTOMY FOR EPIDURAL ABSCESS Left 09/22/2013   Procedure: LUMBAR LAMINECTOMY FOR EPIDURAL ABSCESS LEFT LUMBAR FIVE-SACRAL ONE;  Surgeon: Victory DELENA Gunnels, MD;  Location: [facility];  Service: Neurosurgery;  Laterality: Left;   RADIOLOGY WITH ANESTHESIA N/A 08/13/2014   Procedure: RADIOLOGY WITH ANESTHESIA ;  Surgeon: Medication Radiologist, MD;  Location: [facility];  Service: Radiology;  Laterality: N/A;   URETERAL STENT PLACEMENT     VERTEBROPLASTY N/A 07/14/2013   Procedure: VERTEBROPLASTY WITH LUMBAR THREE BIOPSY;  Surgeon: Victory DELENA Gunnels, MD;  Location: [facility];  Service: Neurosurgery;  Laterality: N/A;  VERTEBROPLASTY WITH LUMBAR THREE BIOPSY   Social History   Social History Narrative   Not on file   There is no immunization history for the selected administration types on file for this patient.   Objective: Vital Signs: BP 100/60  (BP Location: Left Arm, Patient Position: Sitting, Cuff Size: Normal)   Pulse 65   Resp 14   Ht 5' (1.524 m)   Wt 152 lb (68.9 kg)   BMI 29.69 kg/m  Physical Exam Eyes:     Conjunctiva/sclera: Conjunctivae normal.  Cardiovascular:     Rate and Rhythm: Normal rate and regular rhythm.  Pulmonary:     Effort: Pulmonary effort is normal.     Breath sounds: Normal breath sounds.  Musculoskeletal:     Right lower leg: No edema.     Left lower leg: No edema.  Skin:    General: Skin is warm and dry.  Neurological:     Mental Status: She is alert.  Psychiatric:        Mood and Affect: Mood normal.      Musculoskeletal Exam:  Elbows full ROM no tenderness or swelling Wrists full ROM no tenderness or swelling Fingers distal Heberden's nodes throughout both hands, bony widening of right fifth PIP with decreased extension range of motion Tenderness to pressure over bilateral paraspinal muscles of her lower thoracic and lumbar spine without radiation, no focal tenderness on spinous processes Knees full ROM no tenderness or swelling, bilateral patellofemoral crepitus    Investigation: No additional findings.  Imaging: US  THYROID  Result Date: 09/23/2023 CLINICAL DATA:  Incidental on CT. EXAM: THYROID  ULTRASOUND TECHNIQUE: Ultrasound examination of the thyroid  gland and adjacent soft tissues was performed. COMPARISON:  None Available. FINDINGS: Parenchymal Echotexture: Moderately heterogenous Isthmus: 0.3 cm Right lobe: 4.4 x 2.7 x 1.9 cm Left lobe: 4.2 x 1.8 x 1.9 cm _________________________________________________________ Estimated total number of nodules >/= 1 cm: 2 Number of spongiform nodules >/=  2 cm not described below (TR1): 0 Number of mixed cystic and solid nodules >/= 1.5 cm not described below (TR2): 0 _________________________________________________________ Nodule # 1: Solid isoechoic ill-defined nodule in the right mid gland measures approximately 2.0 x 1.9 x 1.6 cm.  Findings are consistent with TI-RADS category 3. *Given size (>/= 1.5 - 2.4 cm) and appearance, a follow-up ultrasound in 1 year should be considered based on TI-RADS criteria. Small isoechoic and cystic nodules in the left mid and lower gland. None meet criteria to recommend biopsy or imaging surveillance. IMPRESSION: 1. Heterogeneous and mildly enlarged thyroid  gland. 2. Nodule # 1 is a 2 cm TI-RADS category 3 nodule in the right mid gland meets criteria for imaging follow-up. Recommend follow-up ultrasound in 1 year. The above is in keeping with the ACR TI-RADS recommendations - J Am Coll Radiol 2017;14:587-595. Electronically Signed   By: Wilkie Lent M.D.   On: 09/23/2023 13:22   US  RENAL Result Date: 09/21/2023 CLINICAL DATA:  Follow-up hydronephrosis. EXAM: RENAL / URINARY TRACT ULTRASOUND COMPLETE COMPARISON:  August 06, 2023 FINDINGS: Right Kidney: Renal measurements: 10.9 cm x 5.3 cm x 6.6 cm = volume: 198.72 mL. Echogenicity within normal limits. No mass is visualized. There is stable moderate severity right-sided hydronephrosis Left Kidney: Renal measurements: 10.6 cm x 5.6 cm x 5.7 cm = volume: 175.4 mL. Cortical thinning is noted. Echogenicity within normal limits. No mass is visualized. There is stable marked severity left-sided hydronephrosis. Bladder: Appears normal for degree of bladder distention. The right ureteral jet is visualized. Other: None. IMPRESSION: 1. Stable bilateral hydronephrosis, left greater than right. Electronically Signed   By: Suzen Dials M.D.   On: 09/21/2023 23:38   DG Chest 2 View Result Date: 09/20/2023 CLINICAL DATA:  Bronchitis follow-up. EXAM: CHEST - 2 VIEW COMPARISON:  01/26/2023 FINDINGS: Cardiac silhouette is normal in size and configuration. No mediastinal or hilar masses. No evidence of adenopathy. Bronchial wall thickening is noted in the lower lobes similar to the prior exam. Lungs are otherwise  clear. No pleural effusion or pneumothorax. Skeletal  structures are demineralized. There are compression fractures in the midthoracic spine and in the upper lumbar spine stable from thoracic spine radiographs dated 06/04/2023. IMPRESSION: 1. No acute cardiopulmonary disease. 2. Bronchial wall thickening in the lower lobes consistent with chronic bronchitis. Electronically Signed   By: Alm Parkins M.D.   On: 09/20/2023 15:08     Recent Labs: Lab Results  Component Value Date   WBC 6.6 09/17/2023   HGB 12.2 09/17/2023   PLT 343 09/17/2023   NA 142 06/04/2023   K 4.0 06/04/2023   CL 103 06/04/2023   CO2 28 06/04/2023   GLUCOSE 89 06/04/2023   BUN 23 06/04/2023   CREATININE 1.13 (H) 06/04/2023   BILITOT 0.3 06/04/2023   ALKPHOS 54 01/27/2023   AST 18 06/04/2023   ALT 6 06/04/2023   PROT 6.9 06/04/2023   ALBUMIN 2.6 (L) 01/27/2023   CALCIUM  10.5 (H) 06/04/2023   GFRAA 67 08/09/2020   QFTBGOLDPLUS Negative 09/17/2023    Speciality Comments: No specialty comments available.  Procedures:  No procedures performed Allergies: Chlorhexidine  gluconate, Morphine and codeine, and Tetracycline   Assessment / Plan:     Visit Diagnoses: Retroperitoneal fibrosis - Plan: azaTHIOprine  (IMURAN ) 50 MG tablet, predniSONE  (DELTASONE ) 5 MG tablet Appears to be under control with normal renal imaging, stable labs. No change in abdominal symptoms does have back pain seemingly more DDD/muscular.  Hydronephrosis and renal function stable on most recent labs.  Pulmonary function with some change but does not seem related to inflammatory lung disease. -Decrease azathioprine  to 50 mg daily - Continue prednisone  2.5 mg daily  High risk medication use - azathioprine  100 mg daily Chronic kidney disease, unspecified Well-managed kidney function with no significant changes. Recent blood tests show no active disease process.  Had recent labs from this month with blood count in normal range.  Bronchiectasis, unspecified Well-managed condition with no active  disease process. Discussed minimizing immunosuppression to prevent exacerbation.  Degenerative lumbar spine disease with osteoarthritis of pelvis, acquired lumbar scoliosis, and status post L3 kyphoplasty Chronic degenerative changes in lumbar spine with pelvic osteoarthritis and acquired scoliosis. Status post L3 kyphoplasty with cement injection. No significant nerve compression. Age-related increase in thoracic kyphosis contributing to forward curvature and limited mobility due to structural changes and loss of disc height. - Recommend exercises to improve posture and flexibility, such as wall or floor stretches to engage the core and extend the abdominal area.        Orders: No orders of the defined types were placed in this encounter.  Meds ordered this encounter  Medications   azaTHIOprine  (IMURAN ) 50 MG tablet    Sig: Take 1 tablet (50 mg total) by mouth daily.    Dispense:  90 tablet    Refill:  0   predniSONE  (DELTASONE ) 5 MG tablet    Sig: Take 0.5 tablets (2.5 mg total) by mouth daily with breakfast.    Dispense:  45 tablet    Refill:  0     Follow-Up Instructions: Return in about 3 months (around 12/26/2023) for RF on AZA/GC taper f/u 3mos.   Lonni LELON Ester, MD  Note - This record has been created using [employer].  Chart creation errors have been sought, but may not always  have been located. Such creation errors do not reflect on  the standard of medical care.

## 2023-09-20 ENCOUNTER — Ambulatory Visit: Payer: Self-pay | Admitting: Internal Medicine

## 2023-09-21 LAB — CBC WITH DIFFERENTIAL/PLATELET
Basophils Absolute: 0.1 x10E3/uL (ref 0.0–0.2)
Basos: 1 %
EOS (ABSOLUTE): 0.2 x10E3/uL (ref 0.0–0.4)
Eos: 3 %
Hematocrit: 37.8 % (ref 34.0–46.6)
Hemoglobin: 12.2 g/dL (ref 11.1–15.9)
Immature Grans (Abs): 0 x10E3/uL (ref 0.0–0.1)
Immature Granulocytes: 0 %
Lymphocytes Absolute: 1.7 x10E3/uL (ref 0.7–3.1)
Lymphs: 26 %
MCH: 31 pg (ref 26.6–33.0)
MCHC: 32.3 g/dL (ref 31.5–35.7)
MCV: 96 fL (ref 79–97)
Monocytes Absolute: 0.4 x10E3/uL (ref 0.1–0.9)
Monocytes: 7 %
Neutrophils Absolute: 4.1 x10E3/uL (ref 1.4–7.0)
Neutrophils: 63 %
Platelets: 343 x10E3/uL (ref 150–450)
RBC: 3.93 x10E6/uL (ref 3.77–5.28)
RDW: 13.6 % (ref 11.7–15.4)
WBC: 6.6 x10E3/uL (ref 3.4–10.8)

## 2023-09-21 LAB — ALPHA-1-ANTITRYPSIN PHENOTYP: A-1 Antitrypsin: 167 mg/dL (ref 101–187)

## 2023-09-21 LAB — IGE: IgE (Immunoglobulin E), Serum: 5 [IU]/mL — ABNORMAL LOW (ref 6–495)

## 2023-09-21 LAB — QUANTIFERON-TB GOLD PLUS
QuantiFERON Mitogen Value: 7.12 [IU]/mL
QuantiFERON Nil Value: 0.01 [IU]/mL
QuantiFERON TB1 Ag Value: 0.02 [IU]/mL
QuantiFERON TB2 Ag Value: 0.01 [IU]/mL
QuantiFERON-TB Gold Plus: NEGATIVE

## 2023-09-21 LAB — IGG, IGA, IGM
IgA/Immunoglobulin A, Serum: 207 mg/dL (ref 64–422)
IgG (Immunoglobin G), Serum: 1161 mg/dL (ref 586–1602)
IgM (Immunoglobulin M), Srm: 58 mg/dL (ref 26–217)

## 2023-09-21 LAB — SEDIMENTATION RATE: Sed Rate: 22 mm/h (ref 0–40)

## 2023-09-21 NOTE — Progress Notes (Signed)
 ATC x1 lmtcb

## 2023-09-25 ENCOUNTER — Ambulatory Visit: Attending: Internal Medicine | Admitting: Internal Medicine

## 2023-09-25 ENCOUNTER — Encounter: Payer: Self-pay | Admitting: Internal Medicine

## 2023-09-25 VITALS — BP 100/60 | HR 65 | Resp 14 | Ht 60.0 in | Wt 152.0 lb

## 2023-09-25 DIAGNOSIS — M961 Postlaminectomy syndrome, not elsewhere classified: Secondary | ICD-10-CM | POA: Diagnosis not present

## 2023-09-25 DIAGNOSIS — K5903 Drug induced constipation: Secondary | ICD-10-CM | POA: Diagnosis not present

## 2023-09-25 DIAGNOSIS — G8929 Other chronic pain: Secondary | ICD-10-CM | POA: Diagnosis not present

## 2023-09-25 DIAGNOSIS — K682 Retroperitoneal fibrosis: Secondary | ICD-10-CM

## 2023-09-25 DIAGNOSIS — F1120 Opioid dependence, uncomplicated: Secondary | ICD-10-CM | POA: Diagnosis not present

## 2023-09-25 DIAGNOSIS — Z79899 Other long term (current) drug therapy: Secondary | ICD-10-CM

## 2023-09-25 DIAGNOSIS — Z7952 Long term (current) use of systemic steroids: Secondary | ICD-10-CM

## 2023-09-25 DIAGNOSIS — M545 Low back pain, unspecified: Secondary | ICD-10-CM | POA: Diagnosis not present

## 2023-09-25 MED ORDER — PREDNISONE 5 MG PO TABS: 2.5000 mg | ORAL_TABLET | Freq: Every day | ORAL | 0 refills | Status: DC

## 2023-09-25 MED ORDER — AZATHIOPRINE 50 MG PO TABS: 50.0000 mg | ORAL_TABLET | Freq: Every day | ORAL | 0 refills | Status: DC

## 2023-09-25 NOTE — Progress Notes (Signed)
 ATC x1 lmtcb

## 2023-09-25 NOTE — Progress Notes (Signed)
 ATC x2 lmtcb mailing letter

## 2023-10-19 DIAGNOSIS — H60502 Unspecified acute noninfective otitis externa, left ear: Secondary | ICD-10-CM | POA: Diagnosis not present

## 2023-10-23 ENCOUNTER — Ambulatory Visit: Admitting: Internal Medicine

## 2023-10-30 ENCOUNTER — Ambulatory Visit (HOSPITAL_COMMUNITY)
Admission: RE | Admit: 2023-10-30 | Discharge: 2023-10-30 | Disposition: A | Discharge status: Home / Self Care | Source: Ambulatory Visit | Attending: Internal Medicine | Admitting: Internal Medicine

## 2023-10-30 DIAGNOSIS — J479 Bronchiectasis, uncomplicated: Secondary | ICD-10-CM | POA: Insufficient documentation

## 2023-10-30 LAB — PULMONARY FUNCTION TEST
DL/VA % pred: 103 %
DL/VA: 4.42 ml/min/mmHg/L
DLCO unc % pred: 96 %
DLCO unc: 16.04 ml/min/mmHg
FEF 25-75 Post: 1.37 L/s
FEF 25-75 Pre: 1.19 L/s
FEF2575-%Change-Post: 15 %
FEF2575-%Pred-Post: 86 %
FEF2575-%Pred-Pre: 75 %
FEV1-%Change-Post: 3 %
FEV1-%Pred-Post: 89 %
FEV1-%Pred-Pre: 86 %
FEV1-Post: 1.64 L
FEV1-Pre: 1.58 L
FEV1FVC-%Change-Post: -5 %
FEV1FVC-%Pred-Pre: 99 %
FEV6-%Change-Post: 7 %
FEV6-%Pred-Post: 96 %
FEV6-%Pred-Pre: 89 %
FEV6-Post: 2.25 L
FEV6-Pre: 2.09 L
FEV6FVC-%Change-Post: -1 %
FEV6FVC-%Pred-Post: 101 %
FEV6FVC-%Pred-Pre: 103 %
FVC-%Change-Post: 9 %
FVC-%Pred-Post: 94 %
FVC-%Pred-Pre: 86 %
FVC-Post: 2.32 L
FVC-Pre: 2.11 L
Post FEV1/FVC ratio: 71 %
Post FEV6/FVC ratio: 97 %
Pre FEV1/FVC ratio: 75 %
Pre FEV6/FVC Ratio: 99 %
RV % pred: 145 %
RV: 2.95 L
TLC % pred: 113 %
TLC: 5.04 L

## 2023-10-30 MED ORDER — ALBUTEROL SULFATE (2.5 MG/3ML) 0.083% IN NEBU
2.5000 mg | INHALATION_SOLUTION | Freq: Once | RESPIRATORY_TRACT | Status: AC
  Administered 2023-10-30: 2.5 mg via RESPIRATORY_TRACT

## 2023-11-04 NOTE — Progress Notes (Unsigned)
 Autumn Duran, female    DOB: 19-Jun-1950    MRN: [id number]   Brief patient profile:  72  yowf  never smoker/MM  grew up with passive smoking which continued until 2004 when husband passed  referred to pulmonary clinic in [place]  09/17/2023 by Dr Rosamond  for cough with abn CT chest 08/28/23  On imuran  x 2023 for renal dz    1.   Bronchiectasis within the left lower lobe with extensive mucous plugging, bronchial wall thickening, and surrounding groundglass suspicious for infectious/inflammatory process, likely bronchiolitis/pneumonitis related to aspiration.  3.    Mild bronchiectasis within the inferior lingula, inferior right lower lobe and right middle lobe to a much lesser extent with areas of mucus plugging.  4.    Few pulmonary micronodules. Per Fleischner criteria, if patient is at high risk for pulmonary malignancy, consider optional follow-up CT chest in 12 months.   [facility]oh  in 2017  [name] w/u most mold on skin testing ? Around 2023    Pt not previously seen by PCCM service.     History of Present Illness  09/17/2023  Pulmonary/ 1st office eval/ Marton Malizia / [place] Office  Chief Complaint  Patient presents with   Establish Care  Dyspnea:  Not limited by breathing from desired activities   Cough: clear mucus / worse in ams x 30 min to resolve each day and then min sporadic thru the day  Sleep: flat bed 2 pillows prefers to lie on L side down  SABA use: none - doesn't use the one she has  02: none  Rec Bronchiectasis =   you have scarring of your bronchial tubes   need to minimize your immunosuppressives to extent possible  For cough/ congestion > mucinex  or mucinex  dm  up to maximum of  1200 mg every 12 hours and use the flutter valve as much as you can   I strongly recommend you take the Prevnar 20 and RSV  thru your PCP  Please schedule a follow up office visit in 6 weeks,   bring your inhaler    - Allergy screen 09/17/2023 >  Eos 0.2 /  IgE 5 - Quant Igs 09/17/2023  nl  -  Quant Gold TB 09/17/2023 nl - Alpha one AT phenotype   09/17/23  MM  level 167 - PFTs  10/30/2023  wnl  x for minimal concavity to FV loop     11/05/2023  f/u ov/[place] office/Kanden Carey re: bronchiectasis  maint on mucinex / flutter  did bring inhaler Chief Complaint  Patient presents with   Cough    W mucus  PFT    Dyspnea:  Not limited by breathing from desired activities   Cough: was white / thick worse in am's  > better p augmentin  Sleeping: flat bed 2 pillows s noct    resp cc  SABA use: has not needed  02: none     No obvious day to day or daytime variability or assoc excess/ purulent sputum or mucus plugs or hemoptysis or cp or chest tightness, subjective wheeze or overt  hb symptoms.    Also denies any obvious fluctuation of symptoms with weather or environmental changes or other aggravating or alleviating factors except as outlined above   No unusual exposure hx or h/o childhood pna/ asthma or knowledge of premature birth.  Current Allergies, Complete Past Medical History, Past Surgical History, Family History, and Social History were reviewed in [employer] record.  ROS  The  following are not active complaints unless bolded Hoarseness, sore throat, dysphagia, dental problems, itching, sneezing,  nasal congestion or discharge of excess mucus or purulent secretions, ear ache,   fever, chills, sweats, unintended wt loss or wt gain, classically pleuritic or exertional cp,  orthopnea pnd or arm/hand swelling  or leg swelling, presyncope, palpitations, abdominal pain, anorexia, nausea, vomiting, diarrhea  or change in bowel habits or change in bladder habits, change in stools or change in urine, dysuria, hematuria,  rash, arthralgias, visual complaints, headache, numbness, weakness or ataxia or problems with walking or coordination,  change in mood or  memory.        Current Meds  Medication Sig   acetaminophen  (TYLENOL ) 325 MG tablet Take 2 tablets (650 mg total)  by mouth every 6 (six) hours as needed for mild pain (pain score 1-3) (or Fever >/= 101).   azaTHIOprine  (IMURAN ) 50 MG tablet Take 1 tablet (50 mg total) by mouth daily.   HYDROcodone -acetaminophen  (NORCO/VICODIN) 5-325 MG tablet Take 1 tablet by mouth every 6 (six) hours as needed (severe pain.).   omeprazole  (PRILOSEC) 20 MG capsule Take 20 mg by mouth daily before breakfast.   oxyCODONE  (OXYCONTIN ) 20 mg 12 hr tablet Take 20 mg by mouth in the morning.   predniSONE  (DELTASONE ) 5 MG tablet Take 0.5 tablets (2.5 mg total) by mouth daily with breakfast.   valsartan-hydrochlorothiazide (DIOVAN-HCT) 80-12.5 MG tablet Take 1 tablet by mouth daily.              Past Medical History:  Diagnosis Date   Bronchitis, chronic (HCC)    Cancer (HCC)    Chronic back pain    Chronic kidney disease    kidney stone s/p stent placement ( removed)   Complication of anesthesia    Compression fracture of L3 lumbar vertebra    Diverticulosis    Elbow fracture 10/2022   GERD (gastroesophageal reflux disease)    Hemorrhoids    Hiatal hernia    History of kidney stones    Hx of adenomatous colonic polyps    Hyperlipidemia    Hypertension    Lumbar radicular pain    Osteoporosis 12/10/2017   PONV (postoperative nausea and vomiting)    Sore throat    T7 vertebral fracture (HCC)       Objective:     Wt Readings from Last 3 Encounters:  11/05/23 153 lb 6.4 oz (69.6 kg)  09/25/23 152 lb (68.9 kg)  09/17/23 158 lb 3.2 oz (71.8 kg)      Vital signs reviewed  11/05/2023  - Note at rest 02 sats  94% on RA   General appearance:    amb pleasant wf nad       HEENT : Oropharynx  c;ear   Nasal turbinates nl / TM's scarred bilaterally s typical light reflex    NECK :  without  apparent JVD/ palpable Nodes/TM    LUNGS: no acc muscle use,  Min barrel/mildly kypotic   contour chest wall with bilateral  slightly decreased bs s audible wheeze and  without cough on insp or exp maneuvers and min   Hyperresonant  to  percussion bilaterally    CV:  RRR  no s3 or murmur or increase in P2, and no edema   ABD:  soft and nontender with pos end  insp Hoover's  in the supine position.  No bruits or organomegaly appreciated   MS:  Nl gait/ ext warm without deformities Or obvious joint restrictions  calf tenderness, cyanosis or clubbing     SKIN: warm and dry without lesions    NEURO:  alert, approp, nl sensorium with  no motor or cerebellar deficits apparent.          CXR PA and Lateral:   09/17/2023 :    I personally reviewed images and impression is as follows:     Mod kyphosis/ no acute findings     Assessment   Assessment & Plan Bronchiectasis without complication (HCC) Onset ? In childhood with heavy cigarette smoke exp assoc with chronic sinusitis ([facility]) - flutter valve training 09/17/2023 >>>  - Allergy screen 09/17/2023 >  Eos 0.2 /  IgE 5 - Quant Igs 09/17/2023  nl  - Quant Gold TB 09/17/2023 nl - Alpha one AT phenotype   09/17/23  MM  level 167 - PFTs  10/30/2023  wnl  x for minimal concavity to FV loop  - 11/05/2023  After extensive coaching inhaler device,  effectiveness =    75% short insp - 11/05/23  instructed/ demonstrated optimal flutter valve technique - 11/05/2023 augmetin x 7 d prn flare    Multiple contingencies reviewed as per AVS  - see  for instructions unique to this ov   F/u can b q 49m, sooner prn  Each RESPIRATORY  medication was reviewed in detail including emphasizing most importantly the difference between maintenance and prns and under what circumstances the prns are to be triggered using an action plan format where appropriate.  Total time for H and P, chart review, counseling, reviewing hfa/ sma device(s) ,  and generating customized AVS unique to this office visit / same day charting = 34 min                Ear ache Referred to ENT 11/05/2023 >>>     AVS  Patient Instructions  For cough/ congestion > mucinex  or mucinex  dm  up to maximum of  1200 mg  every 12 hours and use the flutter valve as much as you can    For nasty or persistently bothersome mucus > augmentin  twice daily x 7 days (refillable)   Use your albuterol  as  a backup medication  to be used if you can't catch your breath by resting, slowing your pace,  or doing a relaxed purse lip breathing pattern.  - The less you use it, the better it will work when you need it. - Ok to use up to 2 puffs  every 4 hours if you must but call for  appointment if use goes up over your usual need - Don't leave home without it !!  (think of it like a spare tire or starter fluid for your car)   My office will be contacting you by phone for referral to [facility] ENT for ear ache  - if you don't hear back from my office within one week please call us  back or notify us  thru MyChart and we'll address it right away.    Please schedule a follow up visit in 6  months but call sooner if needed      Ozell America, MD 11/05/2023

## 2023-11-05 ENCOUNTER — Ambulatory Visit: Admitting: Internal Medicine

## 2023-11-05 ENCOUNTER — Encounter: Payer: Self-pay | Admitting: Internal Medicine

## 2023-11-05 VITALS — BP 109/59 | HR 59 | Ht 60.0 in | Wt 153.4 lb

## 2023-11-05 DIAGNOSIS — J479 Bronchiectasis, uncomplicated: Secondary | ICD-10-CM | POA: Diagnosis not present

## 2023-11-05 DIAGNOSIS — H9209 Otalgia, unspecified ear: Secondary | ICD-10-CM | POA: Insufficient documentation

## 2023-11-05 MED ORDER — AMOXICILLIN-POT CLAVULANATE 875-125 MG PO TABS: ORAL_TABLET | ORAL | 11 refills | Status: AC

## 2023-11-05 NOTE — Assessment & Plan Note (Addendum)
 Onset ? In childhood with heavy cigarette smoke exp assoc with chronic sinusitis (Teoh) - flutter valve training 09/17/2023 >>>  - Allergy screen 09/17/2023 >  Eos 0.2 /  IgE 5 - Quant Igs 09/17/2023  nl  - Quant Gold TB 09/17/2023 nl - Alpha one AT phenotype   09/17/23  MM  level 167 - PFTs  10/30/2023  wnl  x for minimal concavity to FV loop  - 11/05/2023  After extensive coaching inhaler device,  effectiveness =    75% short insp - 11/05/23  instructed/ demonstrated optimal flutter valve technique - 11/05/2023 augmetin x 7 d prn flare    Multiple contingencies reviewed as per AVS  - see  for instructions unique to this ov   F/u can b q 79m, sooner prn  Each RESPIRATORY  medication was reviewed in detail including emphasizing most importantly the difference between maintenance and prns and under what circumstances the prns are to be triggered using an action plan format where appropriate.  Total time for H and P, chart review, counseling, reviewing hfa/ sma device(s) ,  and generating customized AVS unique to this office visit / same day charting = 34 min

## 2023-11-05 NOTE — Assessment & Plan Note (Addendum)
 Referred to ENT 11/05/2023 >>>

## 2023-11-05 NOTE — Patient Instructions (Addendum)
 For cough/ congestion > mucinex  or mucinex  dm  up to maximum of  1200 mg every 12 hours and use the flutter valve as much as you can    For nasty or persistently bothersome mucus > augmentin  twice daily x 7 days (refillable)   Use your albuterol  as  a backup medication  to be used if you can't catch your breath by resting, slowing your pace,  or doing a relaxed purse lip breathing pattern.  - The less you use it, the better it will work when you need it. - Ok to use up to 2 puffs  every 4 hours if you must but call for  appointment if use goes up over your usual need - Don't leave home without it !!  (think of it like a spare tire or starter fluid for your car)   My office will be contacting you by phone for referral to [facility] ENT for ear ache  - if you don't hear back from my office within one week please call us  back or notify us  thru MyChart and we'll address it right away.    Please schedule a follow up visit in 6  months but call sooner if needed

## 2023-11-06 ENCOUNTER — Encounter (INDEPENDENT_AMBULATORY_CARE_PROVIDER_SITE_OTHER): Payer: Self-pay

## 2023-11-06 DIAGNOSIS — J479 Bronchiectasis, uncomplicated: Secondary | ICD-10-CM | POA: Diagnosis not present

## 2023-11-06 MED ORDER — METHYLPREDNISOLONE ACETATE 80 MG/ML IJ SUSP
120.0000 mg | Freq: Once | INTRAMUSCULAR | Status: AC
  Administered 2023-11-06: 120 mg via INTRAMUSCULAR

## 2023-11-06 NOTE — Addendum Note (Signed)
 Addended by: RUFFUS LUKES T on: 11/06/2023 08:29 AM   Modules accepted: Orders

## 2023-12-01 DIAGNOSIS — N390 Urinary tract infection, site not specified: Secondary | ICD-10-CM

## 2023-12-01 HISTORY — DX: Urinary tract infection, site not specified: N39.0

## 2023-12-18 NOTE — Progress Notes (Signed)
 Office Visit Note  Patient: Autumn Duran             Date of Birth: 1950-06-05           MRN: [id number]             PCP: Rosamond Leta NOVAK, MD Referring: Rosamond Leta NOVAK, MD Visit Date: 01/01/2024   Subjective:   Discussed the use of AI scribe software for clinical note transcription with the patient, who gave verbal consent to proceed.  History of Present Illness   Autumn Duran is a 73 y.o. female here for follow up for retroperitoneal fibrosis complicated by low back pain and urinary obstruction currently on azathioprine  50 mg daily prednisone  2.5 mg daily.     Recent blood work shows a calcium  level of 10.6, which has been high in the past and is related to her kidney issues.  Otherwise results reviewed including CBC and CMP from last month were normal.  Sedimentation rate was also normal at 13.  She has a follow-up appointment scheduled in December with Dr. Sherrilee, who is monitoring her condition with regular renal ultrasound.  She has a history of bronchiectasis and manages mucus retention and bacterial colonization. She has been prescribed a six-month course of amoxicillin -clavulanic acid (Augmentin ) to take as needed when experiencing increased mucus and coughing. She recently had a urinary tract infection for which she took Levaquin, splitting the dose to avoid side effects, and it was effective without causing nausea or diarrhea.  Socially, she is active with her family, including a woman she considers like a daughter and her four children, who keep her busy and entertained. She has gained nine pounds recently, attributing it to an increased appetite, and notes that the weight gain is primarily in her back and stomach. She experiences soreness in her back, described as skeletal pain. No nausea or diarrhea with the current medication regimen.       Previous HPI 09/25/2023 Autumn Duran is a 73 y.o. female here for follow up for retroperitoneal fibrosis complicated by low back  pain and urinary obstruction currently on azathioprine  100 mg daily prednisone  2.5 mg daily.     Recent blood tests showed normal inflammatory markers, and her kidney ultrasound findings have not changed.   She has a history of bronchiectasis, and recent evaluations at the pulmonary clinic, including blood tests, were negative for active disease. The focus remains on minimizing immunosuppression to prevent exacerbations.   She experiences chronic low back pain due to osteoarthritis in the pelvis joints and acquired scoliosis in the spine. She previously underwent kyphoplasty at the L3 vertebral body due to a collapse, which involved cement injection. Her lumbar spine shows extensive degenerative disease.   Increased kyphosis in the thoracic spine, commonly referred to as a 'dowager's hump', limits her thoracic spine mobility and contributes to posture issues. She attempts to maintain a straight posture but finds it challenging due to structural changes in her spine.         Previous HPI 06/04/2023 Autumn Duran is a 73 y.o. female here for follow up for retroperitoneal fibrosis complicated by low back pain and urinary obstruction currently on azathioprine  100 mg daily prednisone  2.5 mg daily.    She experiences swelling and pain in the right pinky, which she attributes to arthritis. The symptoms worsen with certain dietary choices, particularly when consuming a lot of meat. The duration of these symptoms has been ongoing for a while, although she does  not specify an exact timeframe.   She reports pain and burning sensations in the toes of her left foot. These symptoms have been present for a while, but no specific duration is provided. She mentions a history of surgery that led to sepsis, after which she was informed that she might experience lifelong numbness in one of her toes. Occasionally, this toe bothers her, and she describes a burning sensation in the area.   She experiences soreness in  her hip, which she associates with muscular inflammation. 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 She describes the soreness as being present for a couple of days following these activities.   She has a history of osteoarthritis, particularly in her hands, where she has developed Heberden's nodes. She recalls her mother also complaining about hand pain, suggesting a possible genetic component. She is currently on a low-dose steroid, which may contribute to skin fragility.   She babysits three days a week and has been avoiding contact with children who are currently sick with the flu and COVID. No urinary trouble, extra swelling in the legs, or recent illnesses.         Previous HPI 03/06/2023 Autumn Duran is a 73 y.o. female here for follow up for retroperitoneal fibrosis complicated by low back pain and urinary obstruction currently on azathioprine  100 mg daily prednisone  5 mg daily.  She was recently hospitalized for pneumonia. She describes the experience as horrible, noting that it took two weeks to regain her strength. The patient reports no current respiratory symptoms, such as coughing or expectoration, but does mention a general body ache during the pneumonia episode.   The patient also has a history of chronic kidney disease. She reports that her kidney function numbers have been fluctuating, but her most recent GFR was in the fifties, which is close to her baseline. She is currently on Imuran  for her retroperitoneal fibrosis and has not reported any issues with this medication.   The patient denies any current pain in her back or flank, but does mention a slight soreness in the middle of her back. She also reports feeling generally achy on some days, but attributes this to her age. She has not had any other infections or needed antibiotics since her  hospitalization for pneumonia.      Previous HPI 11/21/2022 Autumn Duran is a 73 y.o. female here for follow up for retroperitoneal fibrosis complicated by low back pain and urinary obstruction currently on azathioprine  100 mg daily prednisone  2.5 mg daily.  We increased medication dose after the last visit due to markedly elevated sedimentation rate although was not having additional focal symptoms.  She had recent event falling with a right elbow fracture this occurred after trying to turn quickly while holding items in both hands for a school function.  Also has a some associated right rib or otherwise chest wall pain.  He is having poor sleep and associated fatigue describes repeat waking throughout the night about once every hour.  Not associated with needing to urinate or pain.  She has not discussed this with her PCP office or had any sleep medicine evaluation.   Previous HPI 08/15/2022 MERIKAY LESNIEWSKI is a 73 y.o. female here for follow up for retroperitoneal fibrosis complicated by low back pain and urinary obstruction currently on azathioprine  50 mg daily prednisone  2.5 mg daily.  Since last  visit she had repeat ultrasound suggestive for slight worsening of hydronephrosis.  However CT scan looked stable.  She feels significantly worse on the lower dose than she did on 5 mg of prednisone .  She took a slightly increased dose when dealing with some upper respiratory symptoms recently which was more helpful for back pain. She is not noticing any abdominal or flank pain symptoms.   Previous HPI 05/16/22 KHAMIYAH GREFE is a 73 y.o. female here for follow up for retroperitoneal fibrosis on azathioprine  50 mg daily and prednisone  2.5 mg daily.  Since her last visit she did not notice any increase in abdominal or flank pain or urinary symptoms.  Has noticed some increase in joint pain and swelling at the right fifth PIP joint.  She takes ibuprofen  very occasionally which helps the symptoms takes Tylenol   sometimes it is not very helpful.  She did get sick with a sinus infection treated with a short course of Levaquin with good resolution.  Currently having congestion and drainage with nonproductive cough again related to seasonal allergies.  She is scheduled for repeat kidney ultrasound around the end of April with May follow-up in urology clinic.   Previous HPI 02/07/22 YAZMEN BRIONES is a 73 y.o. female here for follow up for retroperitoneal fibrosis on AZA 100 mg daily and prednisone  2.5 mg daily. She is limited but generalized arthritis pain when tapering prednisone  previously. She had some intermittent right lower quadrant pain but none on most days. Recent labs in urology clinic showed normal urinalysis and kidneys appeared with chronic hydronephrosis change but stable on ultrasound appearance. She had recent labs checked with her PCP office a few days ago.   Previous HPI 11/09/21 LYNNIAH JANOSKI is a 73 y.o. female here for follow up for retroperitoneal fibrosis on azathioprine  100 mg daily and prednisone  2.5 mg daily. Since our last visit she saw found to have severe hydronephrosis bilaterally and bilateral obstructing nephrolithiasis. Stent placement and stone removal and subsequent ultrasound showed very good improvement in swelling. Her flank pains also resolved after treatment of these stones.   Previous HPI 08/02/2021 ELLEEN COULIBALY is a 74 y.o. female here for follow up for retroperitoneal fibrosis on azathioprine  100 mg daily. She is doing well overall she had one episode of right flank and lower abdominal pain lasting about 1 week in duration. She also started Diovan 80-12.5mg  for hypertension with improvement in headaches and blood pressure. She is not requiring and furosemide  and no recent edema. She does report some increase in urinary frequency during the day times, with occasional leaking during coughing or sneezing. No pain with urination though. She does not recall exactly when this  started.     Previous HPI 05/02/2021 CHERRELL MAYBEE is a 73 y.o. female here for follow up for retroperitoneal fibrosis on azathioprine  100 mg daily. She discontinued prednisone  after tapering she reported some increased pain in her hand and bones. This started about 3 weeks after she stopped the 2.5 mg prednisone . She also noticed a large increase in pedal edema with stopping prednisone  that cleared up after 3 doses of lasix  prescribed from PCP office. She has some hand pain worst in the 5th fingers and low back pain. Her left leg and knee gave her some trouble also more stiffness and weakness than pain. She resumed taking prednisone  2.5 mg about 2 weeks ago with a large improvement in symptoms again.  She had one episode of right sided abdominal pain starting to  the side and moving to the center in lower abdomen then resolved. She took one percocet due to severity of pain. She never saw any frank blood or stone during this.   Previous HPI 01/31/21 CARYNN FELLING is a 73 y.o. female here for follow up of retroperitoneal fibrosis with hydronephrosis on azathioprine  100 mg PO daily and prednisone  5 mg daily. She tapered prednisone  down from 15 mg to 5 mg daily since last visit without noticing specific problems. She had URI symptoms with congestion and drainage treated with amoxicillin . Weight gain with central adiposity increase she reports 16 pounds total. She notices some sensitivity with monitor bumps playing with her grandson. She is scheduled for renal ultrasound on 12/8 for monitoring by Dr. Sherrilee.   Previous HPI 10/11/20 NIKA YAZZIE is a 74 y.o. female here for follow up for retroperitoneal fibrosis on prednisone  15 mg daily and azathioprine  100 mg p.o. daily.  Since our last visit she became ill with COVID-pneumonia symptoms took the paxlovid treatment for this and respiratory symptoms recovered completely.  She feels overall well recovered from the problem.  The left-sided chest wall and  flank pain and left arm pain from her previous fall are also improved further.  She is noticing some swelling that comes and goes in her lower extremities more than before.  Lower abdominal skin rash is improved   Review of Systems  Constitutional:  Negative for fatigue.  HENT:  Negative for mouth sores and mouth dryness.   Eyes:  Negative for dryness.  Respiratory:  Negative for shortness of breath.   Cardiovascular:  Negative for chest pain and palpitations.  Gastrointestinal:  Positive for constipation. Negative for blood in stool and diarrhea.  Endocrine: Negative for increased urination.  Genitourinary:  Negative for involuntary urination.  Musculoskeletal:  Positive for morning stiffness. Negative for joint pain, gait problem, joint pain, joint swelling, myalgias, muscle weakness, muscle tenderness and myalgias.  Skin:  Negative for color change, rash, hair loss and sensitivity to sunlight.  Allergic/Immunologic: Negative for susceptible to infections.  Neurological:  Negative for dizziness and headaches.  Hematological:  Negative for swollen glands.  Psychiatric/Behavioral:  Positive for sleep disturbance. Negative for depressed mood. The patient is not nervous/anxious.     PMFS History:  Patient Active Problem List   Diagnosis Date Noted   Ear ache 11/05/2023   Bronchiectasis without complication (HCC) 09/17/2023   Right lower lobe pneumonia 01/26/2023   Acute pyelonephritis 01/26/2023   Otitis externa 01/26/2023   Acute kidney injury superimposed on CKD 01/26/2023   Dehydration 01/26/2023   Hypoalbuminemia due to protein-calorie malnutrition 01/26/2023   Mixed hyperlipidemia 01/26/2023   CAP (community acquired pneumonia) 01/26/2023   Closed fracture of right elbow 11/21/2022   Urinary frequency 08/02/2021   Osteoarthritis of both hands 05/02/2021   COVID-19 10/11/2020   Right-sided back pain 10/11/2020   Left-sided chest wall pain 08/09/2020   High risk medication use  08/09/2020   Varicose veins of leg with swelling, bilateral 07/05/2020   Primary hypercholesterolemia 05/11/2020   Compression fracture of L1 lumbar vertebra (HCC) 05/11/2020   Bilateral nephrolithiasis 05/11/2020   Bilateral hydronephrosis 04/14/2020   Retroperitoneal fibrosis 03/09/2020   Degenerative scoliosis 03/05/2020   Degenerative spondylolisthesis 03/05/2020   Opioid dependence (HCC) 09/18/2019   Elevated blood-pressure reading, without diagnosis of hypertension 01/08/2019   Closed fracture of proximal end of right humerus 06/26/2018   Paraesophageal hernia 12/12/2017   Osteoporosis 12/10/2017   Dysphagia 06/26/2017   Gastroesophageal reflux  disease 06/26/2017   Hx of colonic polyps 06/26/2017   Nausea with vomiting 10/12/2014   Acute low back pain 08/13/2014   Epidural abscess 08/13/2014   Hypertension    Chronic back pain    Essential hypertension    Other iron  deficiency anemia 04/10/2014   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/2014   Right hip pain 12/23/2013   Epidural abscess, L2-L5 11/04/2013   Hypokalemia 08/20/2013   Lumbar compression fracture (HCC) 07/12/2013   Compression fracture 07/12/2013   Benign neoplasm of colon 12/18/2011   Esophageal reflux 12/18/2011   Other dysphagia 12/18/2011    Past Medical History:  Diagnosis Date   Bronchitis, chronic (HCC)    Cancer (HCC)    Chronic back pain    Chronic kidney disease    kidney stone s/p stent placement ( removed)   Complication of anesthesia    Compression fracture of L3 lumbar vertebra    Diverticulosis    Elbow fracture 10/2022   GERD (gastroesophageal reflux disease)    Hemorrhoids    Hiatal hernia    History of kidney stones    Hx of adenomatous colonic polyps    Hyperlipidemia    Hypertension    Lumbar radicular pain    Osteoporosis 12/10/2017   PONV (postoperative nausea and vomiting)    Sore throat    T7 vertebral fracture (HCC)    UTI (urinary tract infection) 12/01/2023    Family  History  Problem Relation Age of Onset   Breast cancer Sister 64   Breast cancer Other 47       aunt   Hypertension Mother    COPD Mother    COPD Father    Hypertension Sister    Aortic aneurysm Sister    Hypertension Son    Past Surgical History:  Procedure Laterality Date   BACK SURGERY     BONE MARROW ASPIRATION Left 02/2014   BONE MARROW BIOPSY Left 02/2014   CHOLECYSTECTOMY     COLONOSCOPY  12/18/2011   Procedure: COLONOSCOPY;  Surgeon: Princella CHRISTELLA Nida, MD;  Location: [facility];  Service: Endoscopy;  Laterality: N/A;   COLONOSCOPY WITH PROPOFOL  N/A 08/03/2017   Procedure: COLONOSCOPY WITH PROPOFOL ;  Surgeon: Golda Claudis PENNER, MD;  Location: [facility];  Service: Endoscopy;  Laterality: N/A;   CYSTOSCOPY W/ URETERAL STENT PLACEMENT Bilateral 08/18/2021   Procedure: CYSTOSCOPY WITH  BILATERAL RETROGRADE PYELOGRAM BILATERAL SAMUEL STENT PLACEMENT;  Surgeon: Sherrilee Belvie CROME, MD;  Location: [facility];  Service: Urology;  Laterality: Bilateral;   CYSTOSCOPY WITH RETROGRADE PYELOGRAM, URETEROSCOPY AND STENT PLACEMENT Bilateral 09/01/2021   Procedure: CYSTOSCOPY WITH  bilateral RETROGRADE PYELOGRAM, URETEROSCOPY AND  bilateral STENT exchange;  Surgeon: Sherrilee Belvie CROME, MD;  Location: [facility];  Service: Urology;  Laterality: Bilateral;   CYSTOSCOPY/RETROGRADE/URETEROSCOPY/STONE EXTRACTION WITH BASKET Bilateral 04/18/2018   Procedure: CYSTOSCOPY/BILATERAL RETROGRADE/URETEROSCOPY/STONE EXTRACTION WITH BASKET WITH RIGHT STENT PLACEMENT;  Surgeon: Sherrilee Belvie CROME, MD;  Location: [facility];  Service: Urology;  Laterality: Bilateral;  1 HR   ESOPHAGEAL DILATION N/A 08/03/2017   Procedure: ESOPHAGEAL DILATION;  Surgeon: Golda Claudis PENNER, MD;  Location: [facility];  Service: Endoscopy;  Laterality: N/A;   ESOPHAGOGASTRODUODENOSCOPY  12/18/2011   Procedure: ESOPHAGOGASTRODUODENOSCOPY (EGD);  Surgeon: Princella CHRISTELLA Nida, MD;  Location: [facility];  Service: Endoscopy;  Laterality: N/A;    ESOPHAGOGASTRODUODENOSCOPY (EGD) WITH PROPOFOL  N/A 08/03/2017   Procedure: ESOPHAGOGASTRODUODENOSCOPY (EGD) WITH PROPOFOL ;  Surgeon: Golda Claudis PENNER, MD;  Location: [facility];  Service: Endoscopy;  Laterality: N/A;  pt knows to arrive at 6:15   HERNIA REPAIR     HOLMIUM LASER APPLICATION Right 04/18/2018   Procedure: HOLMIUM LASER APPLICATION;  Surgeon: Sherrilee Belvie CROME, MD;  Location: [facility];  Service: Urology;  Laterality: Right;   HOLMIUM LASER APPLICATION Bilateral 09/01/2021   Procedure: HOLMIUM LASER APPLICATION;  Surgeon: Sherrilee Belvie CROME, MD;  Location: [facility];  Service: Urology;  Laterality: Bilateral;   LUMBAR LAMINECTOMY FOR EPIDURAL ABSCESS Left 09/22/2013   Procedure: LUMBAR LAMINECTOMY FOR EPIDURAL ABSCESS LEFT LUMBAR FIVE-SACRAL ONE;  Surgeon: Victory DELENA Gunnels, MD;  Location: [facility];  Service: Neurosurgery;  Laterality: Left;   RADIOLOGY WITH ANESTHESIA N/A 08/13/2014   Procedure: RADIOLOGY WITH ANESTHESIA ;  Surgeon: Medication Radiologist, MD;  Location: [facility];  Service: Radiology;  Laterality: N/A;   URETERAL STENT PLACEMENT     VERTEBROPLASTY N/A 07/14/2013   Procedure: VERTEBROPLASTY WITH LUMBAR THREE BIOPSY;  Surgeon: Victory DELENA Gunnels, MD;  Location: [facility];  Service: Neurosurgery;  Laterality: N/A;  VERTEBROPLASTY WITH LUMBAR THREE BIOPSY   Social History   Social History Narrative   Not on file   There is no immunization history for the selected administration types on file for this patient.   Objective: Vital Signs: BP (!) 108/58   Pulse (!) 57   Temp 97.6 F (36.4 C)   Resp 16   Ht 5' (1.524 m)   Wt 161 lb (73 kg)   BMI 31.44 kg/m    Physical Exam Eyes:     Conjunctiva/sclera: Conjunctivae normal.  Cardiovascular:     Rate and Rhythm: Normal rate and regular rhythm.  Pulmonary:     Effort: Pulmonary effort is normal.     Breath sounds: Normal breath sounds.  Lymphadenopathy:     Cervical: No cervical adenopathy.  Skin:    General:  Skin is warm and dry.  Neurological:     Mental Status: She is alert.  Psychiatric:        Mood and Affect: Mood normal.      Musculoskeletal Exam:  Elbows full ROM no tenderness or swelling Wrists full ROM no tenderness or swelling Fingers distal Heberden's nodes throughout both hands, bony widening of right fifth PIP with decreased extension range of motion Tenderness to pressure over bilateral paraspinal muscles of her lower thoracic and lumbar spine without radiation, no focal tenderness on spinous processes No lateral hip tenderness to pressure Knees full ROM no tenderness or swelling, bilateral patellofemoral crepitus    Investigation: No additional findings.  Imaging: No results found.  Recent Labs: Lab Results  Component Value Date   WBC 6.6 09/17/2023   HGB 12.2 09/17/2023   PLT 343 09/17/2023   NA 142 06/04/2023   K 4.0 06/04/2023   CL 103 06/04/2023   CO2 28 06/04/2023   GLUCOSE 89 06/04/2023   BUN 23 06/04/2023   CREATININE 1.13 (H) 06/04/2023   BILITOT 0.3 06/04/2023   ALKPHOS 54 01/27/2023   AST 18 06/04/2023   ALT 6 06/04/2023   PROT 6.9 06/04/2023   ALBUMIN 2.6 (L) 01/27/2023   CALCIUM  10.5 (H) 06/04/2023   GFRAA 67 08/09/2020   QFTBGOLDPLUS Negative 09/17/2023    Speciality Comments: No specialty comments available.  Procedures:  No procedures performed Allergies: Chlorhexidine  gluconate, Morphine and codeine, and Tetracycline   Assessment / Plan:     Visit Diagnoses: Retroperitoneal fibrosis - Plan: predniSONE  (DELTASONE ) 5 MG tablet Appears to be under control with normal renal imaging, stable labs.  No change in abdominal symptoms does have back pain seemingly more DDD/muscular.  Hydronephrosis and renal function stable on most recent labs.  Pulmonary function with some change but does not seem related to inflammatory lung disease, but recommendation to decrease immunosuppression per pulmonologist given the bronchiectasis with several  exacerbations requiring antibiotics. Recent labs reviewed from last month including sed rate normal at 13. Will be going for repeat renal ultrasound with Dr. Sherrilee for monitoring. - Discontinue azathioprine  - Continue prednisone  2.5 mg daily  High risk medication use - azathioprine  50 mg daily Recent CBC and CMP from primary care office reviewed which were appropriate for continuing azathioprine .  Plan is to try discontinuing Imuran  and monitoring clinically and if disease activity remains controlled we could decrease monitoring frequency on just GCs.  Bronchiectasis Chronic colonization with periodic flare-ups. Recent UTI treated with Levaquin, tolerated well. Using amoxicillin -clavulanic acid during exacerbations with increased mucus and coughing.     Orders: No orders of the defined types were placed in this encounter.  Meds ordered this encounter  Medications   predniSONE  (DELTASONE ) 5 MG tablet    Sig: Take 0.5 tablets (2.5 mg total) by mouth daily with breakfast.    Dispense:  45 tablet    Refill:  0     Follow-Up Instructions: Return in about 3 months (around 04/02/2024) for RPF GC only f/u 3mos.   Lonni LELON Ester, MD  Note - This record has been created using [employer].  Chart creation errors have been sought, but may not always  have been located. Such creation errors do not reflect on  the standard of medical care.

## 2023-12-20 ENCOUNTER — Encounter (INDEPENDENT_AMBULATORY_CARE_PROVIDER_SITE_OTHER): Payer: Self-pay

## 2023-12-27 ENCOUNTER — Encounter (INDEPENDENT_AMBULATORY_CARE_PROVIDER_SITE_OTHER): Payer: Self-pay

## 2023-12-28 DIAGNOSIS — Z79899 Other long term (current) drug therapy: Secondary | ICD-10-CM | POA: Diagnosis not present

## 2023-12-28 DIAGNOSIS — R5383 Other fatigue: Secondary | ICD-10-CM | POA: Diagnosis not present

## 2023-12-28 DIAGNOSIS — K682 Retroperitoneal fibrosis: Secondary | ICD-10-CM | POA: Diagnosis not present

## 2023-12-28 DIAGNOSIS — E7800 Pure hypercholesterolemia, unspecified: Secondary | ICD-10-CM | POA: Diagnosis not present

## 2024-01-01 ENCOUNTER — Ambulatory Visit: Attending: Internal Medicine | Admitting: Internal Medicine

## 2024-01-01 ENCOUNTER — Other Ambulatory Visit: Payer: Self-pay | Admitting: Obstetrics & Gynecology

## 2024-01-01 ENCOUNTER — Encounter: Payer: Self-pay | Admitting: Internal Medicine

## 2024-01-01 VITALS — BP 108/58 | HR 57 | Temp 97.6°F | Resp 16 | Ht 60.0 in | Wt 161.0 lb

## 2024-01-01 DIAGNOSIS — K5903 Drug induced constipation: Secondary | ICD-10-CM | POA: Diagnosis not present

## 2024-01-01 DIAGNOSIS — Z1231 Encounter for screening mammogram for malignant neoplasm of breast: Secondary | ICD-10-CM

## 2024-01-01 DIAGNOSIS — Z7952 Long term (current) use of systemic steroids: Secondary | ICD-10-CM | POA: Diagnosis not present

## 2024-01-01 DIAGNOSIS — K682 Retroperitoneal fibrosis: Secondary | ICD-10-CM

## 2024-01-01 DIAGNOSIS — F1120 Opioid dependence, uncomplicated: Secondary | ICD-10-CM | POA: Diagnosis not present

## 2024-01-01 DIAGNOSIS — Z79899 Other long term (current) drug therapy: Secondary | ICD-10-CM | POA: Diagnosis not present

## 2024-01-01 DIAGNOSIS — M961 Postlaminectomy syndrome, not elsewhere classified: Secondary | ICD-10-CM | POA: Diagnosis not present

## 2024-01-01 MED ORDER — PREDNISONE 5 MG PO TABS: 2.5000 mg | ORAL_TABLET | Freq: Every day | ORAL | 0 refills | Status: AC

## 2024-01-22 ENCOUNTER — Ambulatory Visit (HOSPITAL_COMMUNITY)

## 2024-01-30 ENCOUNTER — Ambulatory Visit (HOSPITAL_COMMUNITY)
Admission: RE | Admit: 2024-01-30 | Discharge: 2024-01-30 | Disposition: A | Source: Ambulatory Visit | Attending: Urology

## 2024-01-30 DIAGNOSIS — N200 Calculus of kidney: Secondary | ICD-10-CM | POA: Insufficient documentation

## 2024-01-30 DIAGNOSIS — K682 Retroperitoneal fibrosis: Secondary | ICD-10-CM | POA: Diagnosis not present

## 2024-01-30 DIAGNOSIS — N1330 Unspecified hydronephrosis: Secondary | ICD-10-CM | POA: Diagnosis not present

## 2024-02-13 DIAGNOSIS — H5213 Myopia, bilateral: Secondary | ICD-10-CM | POA: Diagnosis not present

## 2024-02-13 DIAGNOSIS — H43813 Vitreous degeneration, bilateral: Secondary | ICD-10-CM | POA: Diagnosis not present

## 2024-02-13 DIAGNOSIS — H52203 Unspecified astigmatism, bilateral: Secondary | ICD-10-CM | POA: Diagnosis not present

## 2024-02-13 DIAGNOSIS — H04123 Dry eye syndrome of bilateral lacrimal glands: Secondary | ICD-10-CM | POA: Diagnosis not present

## 2024-02-13 DIAGNOSIS — H524 Presbyopia: Secondary | ICD-10-CM | POA: Diagnosis not present

## 2024-02-13 DIAGNOSIS — H2513 Age-related nuclear cataract, bilateral: Secondary | ICD-10-CM | POA: Diagnosis not present

## 2024-02-13 DIAGNOSIS — H25013 Cortical age-related cataract, bilateral: Secondary | ICD-10-CM | POA: Diagnosis not present

## 2024-02-15 ENCOUNTER — Ambulatory Visit: Admitting: Urology

## 2024-02-15 ENCOUNTER — Encounter: Payer: Self-pay | Admitting: Urology

## 2024-02-15 VITALS — BP 99/63 | HR 65

## 2024-02-15 DIAGNOSIS — N200 Calculus of kidney: Secondary | ICD-10-CM | POA: Diagnosis not present

## 2024-02-15 DIAGNOSIS — N1330 Unspecified hydronephrosis: Secondary | ICD-10-CM

## 2024-02-15 DIAGNOSIS — K682 Retroperitoneal fibrosis: Secondary | ICD-10-CM

## 2024-02-15 LAB — URINALYSIS, ROUTINE W REFLEX MICROSCOPIC
Bilirubin, UA: NEGATIVE
Glucose, UA: NEGATIVE
Ketones, UA: NEGATIVE
Nitrite, UA: NEGATIVE
Protein,UA: NEGATIVE
Specific Gravity, UA: 1.01 (ref 1.005–1.030)
Urobilinogen, Ur: 0.2 mg/dL (ref 0.2–1.0)
pH, UA: 6.5 (ref 5.0–7.5)

## 2024-02-15 LAB — MICROSCOPIC EXAMINATION: Bacteria, UA: NONE SEEN

## 2024-02-15 NOTE — Progress Notes (Signed)
 "  02/15/2024 12:37 PM   Autumn Duran March 30, 1950 [id number]  Referring provider: Rosamond Leta NOVAK, MD [address] [place],  [place] [address]  Followup hydronephrosis   HPI: Ms Schnoebelen is a 73yo here for followup for bilateral hydronephrosis and nephrolithiasis. No stone events since last visit. Renal US  shows stable bilateral hydronephrosis. She denies any flank pain. No worsening LUTS.  Creatinine   PMH: Past Medical History:  Diagnosis Date   Bronchitis, chronic (HCC)    Cancer (HCC)    Chronic back pain    Chronic kidney disease    kidney stone s/p stent placement ( removed)   Complication of anesthesia    Compression fracture of L3 lumbar vertebra    Diverticulosis    Elbow fracture 10/2022   GERD (gastroesophageal reflux disease)    Hemorrhoids    Hiatal hernia    History of kidney stones    Hx of adenomatous colonic polyps    Hyperlipidemia    Hypertension    Lumbar radicular pain    Osteoporosis 12/10/2017   PONV (postoperative nausea and vomiting)    Sore throat    T7 vertebral fracture (HCC)    UTI (urinary tract infection) 12/01/2023    Surgical History: Past Surgical History:  Procedure Laterality Date   BACK SURGERY     BONE MARROW ASPIRATION Left 02/2014   BONE MARROW BIOPSY Left 02/2014   CHOLECYSTECTOMY     COLONOSCOPY  12/18/2011   Procedure: COLONOSCOPY;  Surgeon: Princella CHRISTELLA Nida, MD;  Location: [facility];  Service: Endoscopy;  Laterality: N/A;   COLONOSCOPY WITH PROPOFOL  N/A 08/03/2017   Procedure: COLONOSCOPY WITH PROPOFOL ;  Surgeon: Golda Claudis PENNER, MD;  Location: [facility];  Service: Endoscopy;  Laterality: N/A;   CYSTOSCOPY W/ URETERAL STENT PLACEMENT Bilateral 08/18/2021   Procedure: CYSTOSCOPY WITH  BILATERAL RETROGRADE PYELOGRAM BILATERAL SAMUEL STENT PLACEMENT;  Surgeon: Sherrilee Belvie CROME, MD;  Location: [facility];  Service: Urology;  Laterality: Bilateral;   CYSTOSCOPY WITH RETROGRADE PYELOGRAM, URETEROSCOPY AND STENT PLACEMENT Bilateral  09/01/2021   Procedure: CYSTOSCOPY WITH  bilateral RETROGRADE PYELOGRAM, URETEROSCOPY AND  bilateral STENT exchange;  Surgeon: Sherrilee Belvie CROME, MD;  Location: [facility];  Service: Urology;  Laterality: Bilateral;   CYSTOSCOPY/RETROGRADE/URETEROSCOPY/STONE EXTRACTION WITH BASKET Bilateral 04/18/2018   Procedure: CYSTOSCOPY/BILATERAL RETROGRADE/URETEROSCOPY/STONE EXTRACTION WITH BASKET WITH RIGHT STENT PLACEMENT;  Surgeon: Sherrilee Belvie CROME, MD;  Location: [facility];  Service: Urology;  Laterality: Bilateral;  1 HR   ESOPHAGEAL DILATION N/A 08/03/2017   Procedure: ESOPHAGEAL DILATION;  Surgeon: Golda Claudis PENNER, MD;  [facility]: [facility];  Service: Endoscopy;  Laterality: N/A;   ESOPHAGOGASTRODUODENOSCOPY  12/18/2011   Procedure: ESOPHAGOGASTRODUODENOSCOPY (EGD);  Surgeon: Princella CHRISTELLA Nida, MD;  Location: [facility];  Service: Endoscopy;  Laterality: N/A;   ESOPHAGOGASTRODUODENOSCOPY (EGD) WITH PROPOFOL  N/A 08/03/2017   Procedure: ESOPHAGOGASTRODUODENOSCOPY (EGD) WITH PROPOFOL ;  Surgeon: Golda Claudis PENNER, MD;  [facility];  Service: Endoscopy;  Laterality: N/A;  pt knows to arrive at 6:15   HERNIA REPAIR     HOLMIUM LASER APPLICATION Right 04/18/2018   Procedure: HOLMIUM LASER APPLICATION;  Surgeon: Sherrilee Belvie CROME, MD;  Location: [facility];  Service: Urology;  Laterality: Right;   HOLMIUM LASER APPLICATION Bilateral 09/01/2021   Procedure: HOLMIUM LASER APPLICATION;  Surgeon: Sherrilee Belvie CROME, MD;  Location: [facility];  Service: Urology;  Laterality: Bilateral;   LUMBAR LAMINECTOMY FOR EPIDURAL ABSCESS Left 09/22/2013   Procedure: LUMBAR LAMINECTOMY FOR EPIDURAL ABSCESS LEFT LUMBAR FIVE-SACRAL ONE;  Surgeon: Victory DELENA Gunnels, MD;  Location: [facility];  Service: Neurosurgery;  Laterality: Left;   RADIOLOGY WITH ANESTHESIA N/A 08/13/2014   Procedure: RADIOLOGY WITH ANESTHESIA ;  Surgeon: Medication Radiologist, MD;  Location: [facility];  Service: Radiology;  Laterality: N/A;   URETERAL STENT  PLACEMENT     VERTEBROPLASTY N/A 07/14/2013   Procedure: VERTEBROPLASTY WITH LUMBAR THREE BIOPSY;  Surgeon: Victory DELENA Gunnels, MD;  Location: [facility];  Service: Neurosurgery;  Laterality: N/A;  VERTEBROPLASTY WITH LUMBAR THREE BIOPSY    Home Medications:  Allergies as of 02/15/2024       Reactions   Chlorhexidine  Gluconate Itching   Pt c/o itching after abd wipes.    Morphine And Codeine Nausea And Vomiting   Headache   Tetracycline Nausea Only        Medication List        Accurate as of February 15, 2024 12:37 PM. If you have any questions, ask your nurse or doctor.          acetaminophen  325 MG tablet Commonly known as: TYLENOL  Take 2 tablets (650 mg total) by mouth every 6 (six) hours as needed for mild pain (pain score 1-3) (or Fever >/= 101).   amoxicillin -clavulanate 875-125 MG tablet Commonly known as: AUGMENTIN  One twice daily with large glass of water    fluconazole 150 MG tablet Commonly known as: DIFLUCAN Take 150 mg by mouth daily.   HYDROcodone -acetaminophen  5-325 MG tablet Commonly known as: NORCO/VICODIN Take 1 tablet by mouth every 6 (six) hours as needed (severe pain.).   ofloxacin 0.3 % ophthalmic solution Commonly known as: OCUFLOX as needed.   omeprazole  20 MG capsule Commonly known as: PRILOSEC Take 20 mg by mouth daily before breakfast.   oxyCODONE  20 mg 12 hr tablet Commonly known as: OXYCONTIN  Take 20 mg by mouth in the morning.   predniSONE  5 MG tablet Commonly known as: DELTASONE  Take 0.5 tablets (2.5 mg total) by mouth daily with breakfast.   valsartan-hydrochlorothiazide 80-12.5 MG tablet Commonly known as: DIOVAN-HCT Take 1 tablet by mouth daily.        Allergies: Allergies[1]  Family History: Family History  Problem Relation Age of Onset   Breast cancer Sister 47   Breast cancer Other 22       aunt   Hypertension Mother    COPD Mother    COPD Father    Hypertension Sister    Aortic aneurysm Sister     Hypertension Son     Social History:  reports that she has never smoked. She has been exposed to tobacco smoke. She has never used smokeless tobacco. She reports that she does not currently use alcohol . She reports that she does not use drugs.  ROS: All other review of systems were reviewed and are negative except what is noted above in HPI  Physical Exam: BP 99/63   Pulse 65   Constitutional:  Alert and oriented, No acute distress. HEENT: [place] AT, moist mucus membranes.  Trachea midline, no masses. Cardiovascular: No clubbing, cyanosis, or edema. Respiratory: Normal respiratory effort, no increased work of breathing. GI: Abdomen is soft, nontender, nondistended, no abdominal masses GU: No CVA tenderness.  Lymph: No cervical or inguinal lymphadenopathy. Skin: No rashes, bruises or suspicious lesions. Neurologic: Grossly intact, no focal deficits, moving all 4 extremities. Psychiatric: Normal mood and affect.  Laboratory Data: Lab Results  Component Value Date   WBC 6.6 09/17/2023   HGB 12.2 09/17/2023   HCT 37.8 09/17/2023  MCV 96 09/17/2023   PLT 343 09/17/2023    Lab Results  Component Value Date   CREATININE 1.13 (H) 06/04/2023    No results found for: PSA  No results found for: TESTOSTERONE  No results found for: HGBA1C  Urinalysis    Component Value Date/Time   COLORURINE AMBER (A) 01/27/2023 0025   APPEARANCEUR Clear 08/15/2023 1518   LABSPEC 1.016 01/27/2023 0025   PHURINE 5.0 01/27/2023 0025   GLUCOSEU Negative 08/15/2023 1518   HGBUR MODERATE (A) 01/27/2023 0025   BILIRUBINUR Negative 08/15/2023 1518   KETONESUR NEGATIVE 01/27/2023 0025   PROTEINUR Negative 08/15/2023 1518   PROTEINUR 30 (A) 01/27/2023 0025   UROBILINOGEN 0.2 08/13/2014 0850   NITRITE Negative 08/15/2023 1518   NITRITE NEGATIVE 01/27/2023 0025   LEUKOCYTESUR 1+ (A) 08/15/2023 1518   LEUKOCYTESUR TRACE (A) 01/27/2023 0025    Lab Results  Component Value Date   LABMICR See  below: 08/15/2023   WBCUA 6-10 (A) 08/15/2023   LABEPIT 0-10 08/15/2023   MUCUS Present 09/08/2021   BACTERIA None seen 08/15/2023    Pertinent Imaging: Renal US  01/30/24: Images reviewed and discussed with the patient  No results found for this or any previous visit.  Results for orders placed during the hospital encounter of 12/18/13  US  Venous Img Lower Bilateral  Narrative CLINICAL DATA:  Leg swelling  EXAM: BILATERAL LOWER EXTREMITY VENOUS DOPPLER ULTRASOUND  TECHNIQUE: Gray-scale sonography with graded compression, as well as color Dopple r and duplex ultrasound were performed to evaluate the lower extremity deep venous systems from the level of the common femoral vein and including the common femoral, femoral, profunda femoral, popliteal and calf veins including the posterior tibial, peroneal and gastrocnemius veins when visible. The superficial great saphenous vein was also interrogated. Spectral Doppler was utilized to evaluate flow at rest and with distal augmentation maneuvers in the common femoral, femoral and popliteal veins.  COMPARISON:  None.  FINDINGS: RIGHT LOWER EXTREMITY  Common Femoral Vein: No evidence of thrombus. Normal compressibility, respiratory phasicity and response to augmentation.  Saphenofemoral Junction: No evidence of thrombus. Normal compressibility and flow on color Doppler imaging.  Profunda Femoral Vein: No evidence of thrombus. Normal compressibility and flow on color Doppler imaging.  Femoral Vein: No evidence of thrombus. Normal compressibility, respiratory phasicity and response to augmentation.  Popliteal Vein: No evidence of thrombus. Normal compressibility, respiratory phasicity and response to augmentation.  Calf Veins: No evidence of thrombus. Normal compressibility and flow on color Doppler imaging.  Superficial Great Saphenous Vein: No evidence of thrombus. Normal compressibility and flow on color Doppler  imaging.  Venous Reflux:  None.  Other Findings:  None.  LEFT LOWER EXTREMITY  Common Femoral Vein: No evidence of thrombus. Normal compressibility, respiratory phasicity and response to augmentation.  Saphenofemoral Junction: No evidence of thrombus. Normal compressibility and flow on color Doppler imaging.  Profunda Femoral Vein: No evidence of thrombus. Normal compressibility and flow on color Doppler imaging.  Femoral Vein: No evidence of thrombus. Normal compressibility, respiratory phasicity and response to augmentation.  Popliteal Vein: No evidence of thrombus. Normal compressibility, respiratory phasicity and response to augmentation.  Calf Veins: Not well visualized.  Superficial Great Saphenous Vein: No evidence of thrombus. Normal compressibility and flow on color Doppler imaging.  Venous Reflux:  None.  Other Findings:  None.  IMPRESSION: No evidence of deep venous thrombosis.   Electronically Signed By: Julaine Blanch On: 12/18/2013 13:37  No results found for this or any previous visit.  No results found for this or any previous visit.  Results for orders placed during the hospital encounter of 01/30/24  US  RENAL  Narrative EXAM: US  Retroperitoneum Complete, Renal. 01/30/2024 03:12:41 PM  TECHNIQUE: Real-time ultrasonography of the retroperitoneum renal was performed.  COMPARISON: US  Renal 09/17/2023  CLINICAL HISTORY: Persistent hydronephrosis.  FINDINGS:  FINDINGS: RIGHT KIDNEY/URETER: Right kidney measures 10.7 x 4.7 x 6.4 cm. Moderate right hydronephrosis, stable. Normal cortical echogenicity. No calculus. No mass.  LEFT KIDNEY/URETER: Left kidney measures 10.6 x 4.5 x 5.1 cm. Severe left hydronephrosis, stable. Cortical thinning noted on the left. No calculus. No mass.  BLADDER: Unremarkable appearance of the bladder.  IMPRESSION: 1. Bilateral hydronephrosis, stable.  Electronically signed by: Franky Crease MD 02/06/2024  01:48 AM EST RP Workstation: [id number]  No results found for this or any previous visit.  No results found for this or any previous visit.  Results for orders placed during the hospital encounter of 01/26/23  CT Renal Stone Study  Narrative CLINICAL DATA:  Left flank pain  EXAM: CT ABDOMEN AND PELVIS WITHOUT CONTRAST  TECHNIQUE: Multidetector CT imaging of the abdomen and pelvis was performed following the standard protocol without IV contrast.  RADIATION DOSE REDUCTION: This exam was performed according to the departmental dose-optimization program which includes automated exposure control, adjustment of the mA and/or kV according to patient size and/or use of iterative reconstruction technique.  COMPARISON:  07/11/2022  FINDINGS: Lower chest: Patchy airspace opacity within the left lower lobe and lingula including a 2.9 cm rounded pleural based opacity with configuration favoring rounded atelectasis. Mild scattered ground-glass opacity in the right lower lobe. Heart size is normal.  Hepatobiliary: No focal liver abnormality is seen. Status post cholecystectomy. No biliary dilatation.  Pancreas: Pancreatic atrophy. No ductal dilatation or fat stranding.  Spleen: Normal in size without focal abnormality.  Adrenals/Urinary Tract: Unremarkable adrenal glands. Numerous punctate 1-2 mm stones within both kidneys. Severe left and moderate right hydronephrosis. No ureteral dilation. No ureteral calculi. Urinary bladder is decompressed.  Stomach/Bowel: Stomach is within normal limits. Appendix appears normal. No evidence of bowel wall thickening, distention, or inflammatory changes.  Vascular/Lymphatic: Aortic atherosclerosis. No enlarged abdominal or pelvic lymph nodes.  Reproductive: Uterus and bilateral adnexa are unremarkable.  Other: No free fluid. No abdominopelvic fluid collection. No pneumoperitoneum. No abdominal wall hernia.  Musculoskeletal: Chronic L1  and L3 compression fractures. Prior L3 cement augmentation. Unchanged grade 1-2 anterolisthesis at L5-S1. No new or acute bony abnormality.  IMPRESSION: 1. Numerous punctate bilateral renal calculi. Severe left and moderate right hydronephrosis, similar to the prior study. Consider chronic proximal ureteral obstructions, possibly secondary to retroperitoneal fibrosis. 2. Patchy airspace opacity within the left lower lobe and lingula including a 2.9 cm rounded pleural based opacity with configuration favoring rounded atelectasis. Mild scattered ground-glass opacity in the right lower lobe. Findings may represent a combination of atelectasis and infection. Follow-up CT of the chest in 3 months is recommended to assess for resolution. 3. Aortic atherosclerosis (ICD10-I70.0).   Electronically Signed By: Mabel Converse D.O. On: 01/26/2023 17:18   Assessment & Plan:    1. Nephrolithiasis (Primary) -followup 6 months with renal US  - Urinalysis, Routine w reflex microscopic; Future - Urinalysis, Routine w reflex microscopic  2. Hydronephrosis -followup 6 months with renal US    No follow-ups on file.  Belvie Clara, MD  [facility] [place]      [1]  Allergies Allergen Reactions   Chlorhexidine  Gluconate Itching    Pt c/o itching  after abd wipes.    Morphine And Codeine Nausea And Vomiting    Headache   Tetracycline Nausea Only   "

## 2024-02-15 NOTE — Patient Instructions (Signed)

## 2024-03-07 ENCOUNTER — Encounter: Payer: Self-pay | Admitting: Internal Medicine

## 2024-03-26 NOTE — Progress Notes (Unsigned)
 "  Office Visit Note  Patient: Autumn Duran             Date of Birth: 1950/08/28           MRN: [id number]             PCP: Rosamond Leta NOVAK, MD Referring: Rosamond Leta NOVAK, MD Visit Date: 04/03/2024   Subjective:  No chief complaint on file.   History of Present Illness: Autumn Duran is a 74 y.o. female here for follow up for retroperitoneal fibrosis complicated by low back pain and urinary obstruction currently on azathioprine  50 mg daily prednisone  2.5 mg daily.      Previous HPI 01/01/2024 Autumn Duran is a 74 y.o. female here for follow up for retroperitoneal fibrosis complicated by low back pain and urinary obstruction currently on azathioprine  50 mg daily prednisone  2.5 mg daily.      Recent blood work shows a calcium  level of 10.6, which has been high in the past and is related to her kidney issues.  Otherwise results reviewed including CBC and CMP from last month were normal.  Sedimentation rate was also normal at 13.  She has a follow-up appointment scheduled in December with Dr. Sherrilee, who is monitoring her condition with regular renal ultrasound.   She has a history of bronchiectasis and manages mucus retention and bacterial colonization. She has been prescribed a six-month course of amoxicillin -clavulanic acid (Augmentin ) to take as needed when experiencing increased mucus and coughing. She recently had a urinary tract infection for which she took Levaquin, splitting the dose to avoid side effects, and it was effective without causing nausea or diarrhea.   Socially, she is active with her family, including a woman she considers like a daughter and her four children, who keep her busy and entertained. She has gained nine pounds recently, attributing it to an increased appetite, and notes that the weight gain is primarily in her back and stomach. She experiences soreness in her back, described as skeletal pain. No nausea or diarrhea with the current medication regimen.          Previous HPI 09/25/2023 Autumn Duran is a 74 y.o. female here for follow up for retroperitoneal fibrosis complicated by low back pain and urinary obstruction currently on azathioprine  100 mg daily prednisone  2.5 mg daily.     Recent blood tests showed normal inflammatory markers, and her kidney ultrasound findings have not changed.   She has a history of bronchiectasis, and recent evaluations at the pulmonary clinic, including blood tests, were negative for active disease. The focus remains on minimizing immunosuppression to prevent exacerbations.   She experiences chronic low back pain due to osteoarthritis in the pelvis joints and acquired scoliosis in the spine. She previously underwent kyphoplasty at the L3 vertebral body due to a collapse, which involved cement injection. Her lumbar spine shows extensive degenerative disease.   Increased kyphosis in the thoracic spine, commonly referred to as a 'dowager's hump', limits her thoracic spine mobility and contributes to posture issues. She attempts to maintain a straight posture but finds it challenging due to structural changes in her spine.         Previous HPI 06/04/2023 Autumn Duran is a 74 y.o. female here for follow up for retroperitoneal fibrosis complicated by low back pain and urinary obstruction currently on azathioprine  100 mg daily prednisone  2.5 mg daily.    She experiences swelling and pain in the right pinky, which she attributes  to arthritis. The symptoms worsen with certain dietary choices, particularly when consuming a lot of meat. The duration of these symptoms has been ongoing for a while, although she does not specify an exact timeframe.   She reports pain and burning sensations in the toes of her left foot. These symptoms have been present for a while, but no specific duration is provided. She mentions a history of surgery that led to sepsis, after which she was informed that she might experience lifelong numbness in  one of her toes. Occasionally, this toe bothers her, and she describes a burning sensation in the area.   She experiences soreness in her hip, which she associates with muscular inflammation. 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 She describes the soreness as being present for a couple of days following these activities.   She has a history of osteoarthritis, particularly in her hands, where she has developed Heberden's nodes. She recalls her mother also complaining about hand pain, suggesting a possible genetic component. She is currently on a low-dose steroid, which may contribute to skin fragility.   She babysits three days a week and has been avoiding contact with children who are currently sick with the flu and COVID. No urinary trouble, extra swelling in the legs, or recent illnesses.         Previous HPI 03/06/2023 Autumn Duran is a 74 y.o. female here for follow up for retroperitoneal fibrosis complicated by low back pain and urinary obstruction currently on azathioprine  100 mg daily prednisone  5 mg daily.  She was recently hospitalized for pneumonia. She describes the experience as horrible, noting that it took two weeks to regain her strength. The patient reports no current respiratory symptoms, such as coughing or expectoration, but does mention a general body ache during the pneumonia episode.   The patient also has a history of chronic kidney disease. She reports that her kidney function numbers have been fluctuating, but her most recent GFR was in the fifties, which is close to her baseline. She is currently on Imuran  for her retroperitoneal fibrosis and has not reported any issues with this medication.   The patient denies any current pain in her back or flank, but does mention a slight soreness in the middle of her back. She also reports  feeling generally achy on some days, but attributes this to her age. She has not had any other infections or needed antibiotics since her hospitalization for pneumonia.      Previous HPI 11/21/2022 Autumn Duran is a 74 y.o. female here for follow up for retroperitoneal fibrosis complicated by low back pain and urinary obstruction currently on azathioprine  100 mg daily prednisone  2.5 mg daily.  We increased medication dose after the last visit due to markedly elevated sedimentation rate although was not having additional focal symptoms.  She had recent event falling with a right elbow fracture this occurred after trying to turn quickly while holding items in both hands for a school function.  Also has a some associated right rib or otherwise chest wall pain.  He is having poor sleep and associated fatigue describes repeat waking throughout the night about once every hour.  Not associated with needing to urinate or pain.  She has not discussed this with her PCP office or had any sleep medicine evaluation.   Previous HPI 08/15/2022 Autumn Duran is a 74  y.o. female here for follow up for retroperitoneal fibrosis complicated by low back pain and urinary obstruction currently on azathioprine  50 mg daily prednisone  2.5 mg daily.  Since last visit she had repeat ultrasound suggestive for slight worsening of hydronephrosis.  However CT scan looked stable.  She feels significantly worse on the lower dose than she did on 5 mg of prednisone .  She took a slightly increased dose when dealing with some upper respiratory symptoms recently which was more helpful for back pain. She is not noticing any abdominal or flank pain symptoms.   Previous HPI 05/16/22 Autumn Duran is a 74 y.o. female here for follow up for retroperitoneal fibrosis on azathioprine  50 mg daily and prednisone  2.5 mg daily.  Since her last visit she did not notice any increase in abdominal or flank pain or urinary symptoms.  Has noticed some  increase in joint pain and swelling at the right fifth PIP joint.  She takes ibuprofen  very occasionally which helps the symptoms takes Tylenol  sometimes it is not very helpful.  She did get sick with a sinus infection treated with a short course of Levaquin with good resolution.  Currently having congestion and drainage with nonproductive cough again related to seasonal allergies.  She is scheduled for repeat kidney ultrasound around the end of April with May follow-up in urology clinic.   Previous HPI 02/07/22 Autumn Duran is a 73 y.o. female here for follow up for retroperitoneal fibrosis on AZA 100 mg daily and prednisone  2.5 mg daily. She is limited but generalized arthritis pain when tapering prednisone  previously. She had some intermittent right lower quadrant pain but none on most days. Recent labs in urology clinic showed normal urinalysis and kidneys appeared with chronic hydronephrosis change but stable on ultrasound appearance. She had recent labs checked with her PCP office a few days ago.   Previous HPI 11/09/21 Autumn Duran is a 74 y.o. female here for follow up for retroperitoneal fibrosis on azathioprine  100 mg daily and prednisone  2.5 mg daily. Since our last visit she saw found to have severe hydronephrosis bilaterally and bilateral obstructing nephrolithiasis. Stent placement and stone removal and subsequent ultrasound showed very good improvement in swelling. Her flank pains also resolved after treatment of these stones.   Previous HPI 08/02/2021 Autumn Duran is a 74 y.o. female here for follow up for retroperitoneal fibrosis on azathioprine  100 mg daily. She is doing well overall she had one episode of right flank and lower abdominal pain lasting about 1 week in duration. She also started Diovan 80-12.5mg  for hypertension with improvement in headaches and blood pressure. She is not requiring and furosemide  and no recent edema. She does report some increase in urinary frequency  during the day times, with occasional leaking during coughing or sneezing. No pain with urination though. She does not recall exactly when this started.     Previous HPI 05/02/2021 Autumn Duran is a 74 y.o. female here for follow up for retroperitoneal fibrosis on azathioprine  100 mg daily. She discontinued prednisone  after tapering she reported some increased pain in her hand and bones. This started about 3 weeks after she stopped the 2.5 mg prednisone . She also noticed a large increase in pedal edema with stopping prednisone  that cleared up after 3 doses of lasix  prescribed from PCP office. She has some hand pain worst in the 5th fingers and low back pain. Her left leg and knee gave her some trouble also more stiffness and weakness than  pain. She resumed taking prednisone  2.5 mg about 2 weeks ago with a large improvement in symptoms again.  She had one episode of right sided abdominal pain starting to the side and moving to the center in lower abdomen then resolved. She took one percocet due to severity of pain. She never saw any frank blood or stone during this.   Previous HPI 01/31/21 TASHAWNDA BLEILER is a 74 y.o. female here for follow up of retroperitoneal fibrosis with hydronephrosis on azathioprine  100 mg PO daily and prednisone  5 mg daily. She tapered prednisone  down from 15 mg to 5 mg daily since last visit without noticing specific problems. She had URI symptoms with congestion and drainage treated with amoxicillin . Weight gain with central adiposity increase she reports 16 pounds total. She notices some sensitivity with monitor bumps playing with her grandson. She is scheduled for renal ultrasound on 12/8 for monitoring by Dr. Sherrilee.   Previous HPI 10/11/20 MERRICK FEUTZ is a 74 y.o. female here for follow up for retroperitoneal fibrosis on prednisone  15 mg daily and azathioprine  100 mg p.o. daily.  Since our last visit she became ill with COVID-pneumonia symptoms took the paxlovid  treatment for this and respiratory symptoms recovered completely.  She feels overall well recovered from the problem.  The left-sided chest wall and flank pain and left arm pain from her previous fall are also improved further.  She is noticing some swelling that comes and goes in her lower extremities more than before.  Lower abdominal skin rash is improved   No Rheumatology ROS completed.   PMFS History:  Patient Active Problem List   Diagnosis Date Noted   Ear ache 11/05/2023   Bronchiectasis without complication (HCC) 09/17/2023   Right lower lobe pneumonia 01/26/2023   Acute pyelonephritis 01/26/2023   Otitis externa 01/26/2023   Acute kidney injury superimposed on CKD 01/26/2023   Dehydration 01/26/2023   Hypoalbuminemia due to protein-calorie malnutrition 01/26/2023   Mixed hyperlipidemia 01/26/2023   CAP (community acquired pneumonia) 01/26/2023   Closed fracture of right elbow 11/21/2022   Urinary frequency 08/02/2021   Osteoarthritis of both hands 05/02/2021   COVID-19 10/11/2020   Right-sided back pain 10/11/2020   Left-sided chest wall pain 08/09/2020   High risk medication use 08/09/2020   Varicose veins of leg with swelling, bilateral 07/05/2020   Primary hypercholesterolemia 05/11/2020   Compression fracture of L1 lumbar vertebra (HCC) 05/11/2020   Bilateral nephrolithiasis 05/11/2020   Bilateral hydronephrosis 04/14/2020   Retroperitoneal fibrosis 03/09/2020   Degenerative scoliosis 03/05/2020   Degenerative spondylolisthesis 03/05/2020   Opioid dependence (HCC) 09/18/2019   Elevated blood-pressure reading, without diagnosis of hypertension 01/08/2019   Closed fracture of proximal end of right humerus 06/26/2018   Paraesophageal hernia 12/12/2017   Osteoporosis 12/10/2017   Dysphagia 06/26/2017   Gastroesophageal reflux disease 06/26/2017   Hx of colonic polyps 06/26/2017   Nausea with vomiting 10/12/2014   Acute low back pain 08/13/2014   Epidural abscess  08/13/2014   Hypertension    Chronic back pain    Essential hypertension    Other iron  deficiency anemia 04/10/2014   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/2014   Right hip pain 12/23/2013   Epidural abscess, L2-L5 11/04/2013   Hypokalemia 08/20/2013   Lumbar compression fracture (HCC) 07/12/2013   Compression fracture 07/12/2013   Benign neoplasm of colon 12/18/2011   Esophageal reflux 12/18/2011   Other dysphagia 12/18/2011    Past Medical History:  Diagnosis Date   Bronchitis, chronic (  HCC)    Cancer (HCC)    Chronic back pain    Chronic kidney disease    kidney stone s/p stent placement ( removed)   Complication of anesthesia    Compression fracture of L3 lumbar vertebra    Diverticulosis    Elbow fracture 10/2022   GERD (gastroesophageal reflux disease)    Hemorrhoids    Hiatal hernia    History of kidney stones    Hx of adenomatous colonic polyps    Hyperlipidemia    Hypertension    Lumbar radicular pain    Osteoporosis 12/10/2017   PONV (postoperative nausea and vomiting)    Sore throat    T7 vertebral fracture (HCC)    UTI (urinary tract infection) 12/01/2023    Family History  Problem Relation Age of Onset   Breast cancer Sister 47   Breast cancer Other 66       aunt   Hypertension Mother    COPD Mother    COPD Father    Hypertension Sister    Aortic aneurysm Sister    Hypertension Son    Past Surgical History:  Procedure Laterality Date   BACK SURGERY     BONE MARROW ASPIRATION Left 02/2014   BONE MARROW BIOPSY Left 02/2014   CHOLECYSTECTOMY     COLONOSCOPY  12/18/2011   Procedure: COLONOSCOPY;  Surgeon: Princella CHRISTELLA Nida, MD;  Location: [facility];  Service: Endoscopy;  Laterality: N/A;   COLONOSCOPY WITH PROPOFOL  N/A 08/03/2017   Procedure: COLONOSCOPY WITH PROPOFOL ;  Surgeon: Golda Claudis PENNER, MD;  Location: [facility];  Service: Endoscopy;  Laterality: N/A;   CYSTOSCOPY W/ URETERAL STENT PLACEMENT Bilateral 08/18/2021   Procedure: CYSTOSCOPY WITH   BILATERAL RETROGRADE PYELOGRAM BILATERAL SAMUEL STENT PLACEMENT;  Surgeon: Sherrilee Belvie CROME, MD;  Location: [facility];  Service: Urology;  Laterality: Bilateral;   CYSTOSCOPY WITH RETROGRADE PYELOGRAM, URETEROSCOPY AND STENT PLACEMENT Bilateral 09/01/2021   Procedure: CYSTOSCOPY WITH  bilateral RETROGRADE PYELOGRAM, URETEROSCOPY AND  bilateral STENT exchange;  Surgeon: Sherrilee Belvie CROME, MD;  Location: [facility];  Service: Urology;  Laterality: Bilateral;   CYSTOSCOPY/RETROGRADE/URETEROSCOPY/STONE EXTRACTION WITH BASKET Bilateral 04/18/2018   Procedure: CYSTOSCOPY/BILATERAL RETROGRADE/URETEROSCOPY/STONE EXTRACTION WITH BASKET WITH RIGHT STENT PLACEMENT;  Surgeon: Sherrilee Belvie CROME, MD;  Location: [facility];  Service: Urology;  Laterality: Bilateral;  1 HR   ESOPHAGEAL DILATION N/A 08/03/2017   Procedure: ESOPHAGEAL DILATION;  Surgeon: Golda Claudis PENNER, MD;  Location: [facility];  Service: Endoscopy;  Laterality: N/A;   ESOPHAGOGASTRODUODENOSCOPY  12/18/2011   Procedure: ESOPHAGOGASTRODUODENOSCOPY (EGD);  Surgeon: Princella CHRISTELLA Nida, MD;  Location: [facility];  Service: Endoscopy;  Laterality: N/A;   ESOPHAGOGASTRODUODENOSCOPY (EGD) WITH PROPOFOL  N/A 08/03/2017   Procedure: ESOPHAGOGASTRODUODENOSCOPY (EGD) WITH PROPOFOL ;  Surgeon: Golda Claudis PENNER, MD;  Location: [facility];  Service: Endoscopy;  Laterality: N/A;  pt knows to arrive at 6:15   HERNIA REPAIR     HOLMIUM LASER APPLICATION Right 04/18/2018   Procedure: HOLMIUM LASER APPLICATION;  Surgeon: Sherrilee Belvie CROME, MD;  Location: [facility];  Service: Urology;  Laterality: Right;   HOLMIUM LASER APPLICATION Bilateral 09/01/2021   Procedure: HOLMIUM LASER APPLICATION;  Surgeon: Sherrilee Belvie CROME, MD;  Location: [facility];  Service: Urology;  Laterality: Bilateral;   LUMBAR LAMINECTOMY FOR EPIDURAL ABSCESS Left 09/22/2013   Procedure: LUMBAR LAMINECTOMY FOR EPIDURAL ABSCESS LEFT LUMBAR FIVE-SACRAL ONE;  Surgeon: Victory DELENA Gunnels, MD;  Location: [facility];   Service: Neurosurgery;  Laterality: Left;   RADIOLOGY WITH ANESTHESIA  N/A 08/13/2014   Procedure: RADIOLOGY WITH ANESTHESIA ;  Surgeon: Medication Radiologist, MD;  Location: [facility];  Service: Radiology;  Laterality: N/A;   URETERAL STENT PLACEMENT     VERTEBROPLASTY N/A 07/14/2013   Procedure: VERTEBROPLASTY WITH LUMBAR THREE BIOPSY;  Surgeon: Victory DELENA Gunnels, MD;  Location: [facility];  Service: Neurosurgery;  Laterality: N/A;  VERTEBROPLASTY WITH LUMBAR THREE BIOPSY   Social History   Social History Narrative   Not on file   There is no immunization history for the selected administration types on file for this patient.   Objective: Vital Signs: There were no vitals taken for this visit.   Physical Exam   Musculoskeletal Exam: ***   Investigation: No additional findings.  Imaging: No results found.  Recent Labs: Lab Results  Component Value Date   WBC 6.6 09/17/2023   HGB 12.2 09/17/2023   PLT 343 09/17/2023   NA 142 06/04/2023   K 4.0 06/04/2023   CL 103 06/04/2023   CO2 28 06/04/2023   GLUCOSE 89 06/04/2023   BUN 23 06/04/2023   CREATININE 1.13 (H) 06/04/2023   BILITOT 0.3 06/04/2023   ALKPHOS 54 01/27/2023   AST 18 06/04/2023   ALT 6 06/04/2023   PROT 6.9 06/04/2023   ALBUMIN 2.6 (L) 01/27/2023   CALCIUM  10.5 (H) 06/04/2023   GFRAA 67 08/09/2020   QFTBGOLDPLUS Negative 09/17/2023    Speciality Comments: No specialty comments available.  Procedures:  No procedures performed Allergies: Chlorhexidine  gluconate, Morphine and codeine, and Tetracycline   Assessment / Plan:     Visit Diagnoses:  Assessment & Plan Retroperitoneal fibrosis     High risk medication use      ***  Follow-Up Instructions: No follow-ups on file.   Yoltzin Barg M Leba Tibbitts, CMA  Note - This record has been created using Animal nutritionist.  Chart creation errors have been sought, but may not always  have been located. Such creation errors do not reflect on  the standard  of medical care. "

## 2024-03-26 NOTE — Assessment & Plan Note (Signed)
 SABRA

## 2024-04-03 ENCOUNTER — Ambulatory Visit: Admitting: Internal Medicine

## 2024-04-03 DIAGNOSIS — K682 Retroperitoneal fibrosis: Secondary | ICD-10-CM

## 2024-04-03 DIAGNOSIS — Z79899 Other long term (current) drug therapy: Secondary | ICD-10-CM

## 2024-04-22 ENCOUNTER — Ambulatory Visit: Admitting: Internal Medicine

## 2024-08-21 ENCOUNTER — Other Ambulatory Visit (HOSPITAL_COMMUNITY)

## 2024-09-05 ENCOUNTER — Ambulatory Visit: Admitting: Urology
